# Patient Record
Sex: Male | Born: 1960
Health system: Southern US, Community
[De-identification: ages and names within clinical notes are randomized; demographics above are authoritative.]

## PROBLEM LIST (undated history)

## (undated) DIAGNOSIS — E785 Hyperlipidemia, unspecified: Secondary | ICD-10-CM

## (undated) DIAGNOSIS — Z87442 Personal history of urinary calculi: Secondary | ICD-10-CM

## (undated) DIAGNOSIS — Z8719 Personal history of other diseases of the digestive system: Secondary | ICD-10-CM

## (undated) DIAGNOSIS — M1990 Unspecified osteoarthritis, unspecified site: Secondary | ICD-10-CM

## (undated) DIAGNOSIS — S069X9A Unspecified intracranial injury with loss of consciousness of unspecified duration, initial encounter: Secondary | ICD-10-CM

## (undated) DIAGNOSIS — R911 Solitary pulmonary nodule: Secondary | ICD-10-CM

## (undated) DIAGNOSIS — I2510 Atherosclerotic heart disease of native coronary artery without angina pectoris: Secondary | ICD-10-CM

## (undated) DIAGNOSIS — D229 Melanocytic nevi, unspecified: Secondary | ICD-10-CM

## (undated) DIAGNOSIS — N400 Enlarged prostate without lower urinary tract symptoms: Secondary | ICD-10-CM

## (undated) DIAGNOSIS — Z91018 Allergy to other foods: Secondary | ICD-10-CM

## (undated) DIAGNOSIS — E039 Hypothyroidism, unspecified: Secondary | ICD-10-CM

## (undated) DIAGNOSIS — F419 Anxiety disorder, unspecified: Secondary | ICD-10-CM

## (undated) DIAGNOSIS — I10 Essential (primary) hypertension: Secondary | ICD-10-CM

## (undated) DIAGNOSIS — T884XXA Failed or difficult intubation, initial encounter: Secondary | ICD-10-CM

## (undated) DIAGNOSIS — K219 Gastro-esophageal reflux disease without esophagitis: Secondary | ICD-10-CM

## (undated) DIAGNOSIS — Z9689 Presence of other specified functional implants: Secondary | ICD-10-CM

## (undated) DIAGNOSIS — T7840XA Allergy, unspecified, initial encounter: Secondary | ICD-10-CM

## (undated) DIAGNOSIS — I499 Cardiac arrhythmia, unspecified: Secondary | ICD-10-CM

## (undated) DIAGNOSIS — J189 Pneumonia, unspecified organism: Secondary | ICD-10-CM

## (undated) DIAGNOSIS — G709 Myoneural disorder, unspecified: Secondary | ICD-10-CM

## (undated) HISTORY — DX: Hyperlipidemia, unspecified: E78.5

## (undated) HISTORY — PX: COLONOSCOPY: SHX174

## (undated) HISTORY — DX: Allergy, unspecified, initial encounter: T78.40XA

## (undated) HISTORY — DX: Solitary pulmonary nodule: R91.1

## (undated) HISTORY — PX: HERNIA REPAIR: SHX51

## (undated) HISTORY — DX: Unspecified osteoarthritis, unspecified site: M19.90

## (undated) HISTORY — DX: Hypothyroidism, unspecified: E03.9

## (undated) HISTORY — PX: HEMORRHOID BANDING: SHX5850

## (undated) HISTORY — DX: Gastro-esophageal reflux disease without esophagitis: K21.9

## (undated) HISTORY — DX: Essential (primary) hypertension: I10

## (undated) HISTORY — PX: OTHER SURGICAL HISTORY: SHX169

## (undated) HISTORY — PX: SPINAL FUSION: SHX223

## (undated) HISTORY — PX: VASECTOMY: SHX75

## (undated) HISTORY — DX: Benign prostatic hyperplasia without lower urinary tract symptoms: N40.0

## (undated) HISTORY — PX: BACK SURGERY: SHX140

## (undated) HISTORY — PX: CORONARY ANGIOPLASTY: SHX604

## (undated) HISTORY — DX: Melanocytic nevi, unspecified: D22.9

---

## 1980-08-06 HISTORY — PX: TONSILLECTOMY: SUR1361

## 2004-04-18 ENCOUNTER — Emergency Department (HOSPITAL_COMMUNITY): Admission: EM | Admit: 2004-04-18 | Discharge: 2004-04-18 | Payer: Self-pay | Admitting: Emergency Medicine

## 2008-03-22 ENCOUNTER — Ambulatory Visit: Payer: Self-pay | Admitting: Cardiology

## 2008-03-22 ENCOUNTER — Encounter: Payer: Self-pay | Admitting: Pulmonary Disease

## 2008-03-22 ENCOUNTER — Observation Stay (HOSPITAL_COMMUNITY): Admission: EM | Admit: 2008-03-22 | Discharge: 2008-03-23 | Payer: Self-pay | Admitting: Emergency Medicine

## 2008-03-26 ENCOUNTER — Ambulatory Visit: Payer: Self-pay | Admitting: Cardiology

## 2008-03-26 ENCOUNTER — Ambulatory Visit: Payer: Self-pay

## 2008-04-21 ENCOUNTER — Ambulatory Visit: Payer: Self-pay | Admitting: Cardiology

## 2008-05-20 ENCOUNTER — Encounter: Payer: Self-pay | Admitting: Pulmonary Disease

## 2008-05-28 ENCOUNTER — Encounter: Payer: Self-pay | Admitting: Pulmonary Disease

## 2008-06-11 ENCOUNTER — Ambulatory Visit: Payer: Self-pay | Admitting: Pulmonary Disease

## 2008-06-11 DIAGNOSIS — I10 Essential (primary) hypertension: Secondary | ICD-10-CM | POA: Insufficient documentation

## 2008-06-11 DIAGNOSIS — J984 Other disorders of lung: Secondary | ICD-10-CM | POA: Insufficient documentation

## 2008-09-21 ENCOUNTER — Encounter: Payer: Self-pay | Admitting: Pulmonary Disease

## 2008-10-01 ENCOUNTER — Ambulatory Visit: Payer: Self-pay | Admitting: Pulmonary Disease

## 2009-08-06 DIAGNOSIS — G709 Myoneural disorder, unspecified: Secondary | ICD-10-CM

## 2009-08-06 HISTORY — DX: Myoneural disorder, unspecified: G70.9

## 2010-06-05 ENCOUNTER — Ambulatory Visit (HOSPITAL_BASED_OUTPATIENT_CLINIC_OR_DEPARTMENT_OTHER): Admission: RE | Admit: 2010-06-05 | Discharge: 2010-06-05 | Payer: Self-pay | Admitting: Orthopedic Surgery

## 2010-09-25 NOTE — Op Note (Signed)
Jeffrey Hooper, [place]               ACCOUNT NO.:  [id number]  MEDICAL RECORD NO.:  [id number]          PATIENT TYPE:  AMB  LOCATION:  DSC                          FACILITY:  [facility]  PHYSICIAN:  Cindee Salt, M.D.       DATE OF BIRTH:  [birth year]  DATE OF PROCEDURE:  06/05/2010 DATE OF DISCHARGE:                              OPERATIVE REPORT   PREOPERATIVE DIAGNOSIS:  Laceration, radial digital nerve, left hand.  POSTOPERATIVE DIAGNOSIS:  Laceration, radial digital nerve, left hand.  OPERATION:  Excision neuroma, repair of radial nerve with NeuraGen tube, left hand.  SURGEON:  Cindee Salt, MD  ASSISTANT:  Carolyne Fiscal.  ANESTHESIA:  Axillary block.  ANESTHESIOLOGIST:  Quita Skye. Krista Blue, MD  HISTORY:  The patient is a 50 year old male with history of a laceration of his left thenar eminence with a foreign body.  He has complained of numbness in the area distal with a positive Tinel's at his laceration. He is admitted for exploration of radial nerve with possible repair. Pre, peri, and postoperative course have been discussed along with risks and complications.  He is aware that there is no guarantee with the surgery; possibility of infection; recurrence injury to arteries, nerves, tendons, complete relief of symptoms, dystrophy; possibility of necessity of burying the nerve should this not regenerate properly.  In the preoperative area, the patient is seen, the extremity marked by both the patient and surgeon, and antibiotic given.  PROCEDURE:  The patient was brought to the operating room.  An axillary block carried out without difficulty.  He was prepped using ChloraPrep, supine position, left arm free.  A 3-minute dry time was allowed.  Time- out taken confirming the patient and procedure.  The limb was exsanguinated with an Esmarch.  Tourniquet was placed high and the forearm was inflated to 250 mmHg.  The area of the foreign body laceration was opened.  The radial nerve sensory  branch was identified proximally and distally.  This was followed back to the area of the laceration where a large neuroma was present.  The operative microscope was brought into position.  This was unable to be further dissected free.  This was resected.  A 1.5-mm NeuraGen tube was then soaked for 10 minutes.  This was cut in half.  The two ends of the nerve were then sutured into the NeuraGen after irrigation with saline.  This was done with a horizontal mattress suture, which brought the nerve endings within a millimeter or two of each other.  The wound was again irrigated.  The skin then closed with interrupted 5-0 Vicryl Rapide sutures.  The procedure was done cutting back to normal fascicles under the operative microscope.  A sterile compressive dressing and splint was applied.  On deflation of the tourniquet, the fingers and thumb immediately pinked.  The specimen was sent to pathology.  The patient tolerated the procedure well and was taken to the recovery room for observation in satisfactory condition.  He will be discharged to home to return in the [facility] [place] in 1 week, on Vicodin.  ______________________________ Cindee Salt, M.D.     GK/MEDQ  D:  06/05/2010  T:  06/06/2010  Job:  [id number]  cc:   Ernestina Penna, M.D.  Electronically Signed by Cindee Salt M.D. on 09/25/2010 12:18:22 PM

## 2010-10-18 LAB — POCT I-STAT, CHEM 8
Chloride: 106 mEq/L (ref 96–112)
Glucose, Bld: 90 mg/dL (ref 70–99)
HCT: 48 % (ref 39.0–52.0)

## 2010-12-19 NOTE — Assessment & Plan Note (Signed)
[facility]                            CARDIOLOGY OFFICE NOTE   Jeffrey Hooper, Jeffrey Hooper                      MRN:          [id number]  DATE:04/21/2008                            DOB:          April 30, 1961    PRIMARY CARE PHYSICIAN:  Lorin Picket Long, PA   REASON FOR PRESENTATION:  Evaluate the patient with recent  hospitalization for chest pain.   HISTORY OF PRESENT ILLNESS:  The patient was admitted on March 22, 2008  for chest pain.  He also had a headache.  He ruled out for myocardial  infarction.  He was noted to be bradycardic during that hospitalization  with heart rates in the 50s.  Subsequently, he had a stress perfusion  study in our office that demonstrated a well-preserved ejection fraction  of 68%.  There was no ischemia or infarct.  He wore a Holter monitor and  was found to have heart rate, which averaged in the 50s.  However, he  had normal chronotropic competence.  He did have heart rates in the 30s  during his sleeping hours.  He has had no symptoms related to this.   Since that time, he has had no further chest discomfort other than some  fleeting, stinging discomfort.  He has been walking for exercise.  He  gets no chest pressure with this.  He has appropriate shortness of  breath with this.  He does not have any PND or orthopnea.  He has not  felt his heart racing or skipping.  He has had no presyncope or syncope.   PAST MEDICAL HISTORY:  Hypothyroidism, hypertension, previous tobacco  use, cervical and thoracic surgery.   ALLERGIES/INTOLERANCES:  VIBRAMYCIN AND DURAGESIC PATCH.   MEDICATIONS:  1. Aspirin 81 mg daily.  2. Hydrochlorothiazide 25 mg daily.  3. Synthroid 112 mcg daily.  4. Aciphex.   REVIEW OF SYSTEMS:  As stated in the HPI and otherwise negative for  other systems.   PHYSICAL EXAMINATION:  GENERAL:  The patient is in no distress.  VITAL SIGNS:  Blood pressure 124/92, heart rate 51 and regular, weight  209 pounds,  body mass index 29.  HEENT:  Eyelids unremarkable; pupils equal, round, and reactive to  light; fundi not visualized; oral mucosa unremarkable.  NECK:  No jugular venous distention at 45 degrees; carotid upstroke  brisk and symmetric; no bruits, no thyromegaly.  LYMPHATICS:  No adenopathy.  LUNGS:  Clear to auscultation bilaterally.  BACK:  No costovertebral angle tenderness.  CHEST:  Unremarkable.  HEART:  PMI not displaced on sustained; S1 and S2 within normal limits;  no S3, no S4; no clicks, no rubs, no murmurs.  ABDOMEN:  Flat; positive bowel sounds, normal in frequency and pitch; no  bruits, no rebound, no guarding; no midline pulsatile mass, no  hepatomegaly, no splenomegaly.  SKIN:  No rashes, no nodules.  EXTREMITIES:  2+ pulses, no edema.   ASSESSMENT AND PLAN:  1. Chest pain.  The patient has had no further chest pain.  He had      negative stress perfusion study.  No further cardiovascular testing  is suggested.  He will continue with primary risk reduction.  2. Bradycardia.  He is not having any symptoms with this.  He has      normal chronotropic competence.  No further evaluation is      warranted.  He will let me know if he ever has any lightheadedness,      presyncope, syncope, or palpitations.  3. Dyslipidemia.  I did review this with him.  In the hospital, his      LDL was 141 and his HDL was 31.  I have suggested diet and exercise      for about 3 months and then a repeat.  If he has not had much      improvement, I would suggest a statin.  He will follow up with Mr.      Jacqulyn Bath.  4. Followup.  He can come back to this clinic as needed.     Rollene Rotunda, MD, [facility]  Electronically Signed    JH/MedQ  DD: 04/21/2008  DT: 04/22/2008  Job #: [id number]   cc:   Lindaann Pascal, PA

## 2010-12-19 NOTE — Discharge Summary (Signed)
[name], [name] NO.:  [id number]   MEDICAL RECORD NO.:  [id number]          PATIENT TYPE:  OBV   LOCATION:  3733                         FACILITY:  [facility]   PHYSICIAN:  Marca Ancona, MD      DATE OF BIRTH:  [birth year]   DATE OF ADMISSION:  03/22/2008  DATE OF DISCHARGE:  03/23/2008                               DISCHARGE SUMMARY   PROCEDURES:  None.   PRIMARY FINAL DISCHARGE DIAGNOSIS:  Chest pain, cardiac enzymes negative  for myocardial infarction and outpatient Myoview planned.   SECONDARY DIAGNOSES:  1. Bradycardia, Holter monitor planned as an outpatient.  2. Hypertension.  3. Hypothyroidism.  4. Allergy or intolerance to VIBRAMYCIN and DURAGESIC PATCHES.  5. Status post cervical and thoracic surgery.  6. A 30-pack year history of tobacco use, quit 1 year.  7. Family history of coronary artery disease in his father.  8. Headache, resolved.   TIME AT DISCHARGE:  41 minutes.   HOSPITAL COURSE:  Jeffrey Hooper is a 50 year old male with no previous  history of coronary artery disease.  He had chest pain that was somewhat  atypical.  He was admitted for further evaluation and treatment.   Cardiac enzymes were negative for MI.  His chest pain and headache that  he had also had for several days had resolved.  His chest x-ray showed  no acute disease.  He had no significant abnormalities in his labs,  although a lipid profile was pending at the time of dictation.  TSH is  also pending.   On March 23, 2008, Jeffrey Hooper was seen by Dr. Shirlee Latch.  Dr. Shirlee Latch felt  that Jeffrey Hooper could be safely discharged home with close outpatient  followup and stress testing.  Because of the sinus bradycardia with the  heart rate in the low 50s at times, a Holter monitor is also planned.   DISCHARGE INSTRUCTIONS:  1. His activity level is to be increased gradually.  2. He is to eat a heart-healthy low-sodium diet.  3. He is to get a stress test at our office and pick  up his Holter      monitor this Friday at 9:45.  4. He is not to eat or drink anything after midnight before and he is      to take his medicines in a.m. with a sip of water.  5. No caffeine or decaf products 24 hours before.  6. He is to follow up with Dr. Antoine Poche in [place] and with Dr. Christell Constant      as needed.   DISCHARGE MEDICATIONS:  1. Aspirin 81 mg daily.  2. Hydrochlorothiazide 25 mg a day.  3. Synthroid 125 mcg daily.  4. Aciphex daily.      Theodore Demark, PA-C      Marca Ancona, MD  Electronically Signed    RB/MEDQ  D:  03/23/2008  T:  03/24/2008  Job:  [id number]   cc:   Ernestina Penna, M.D.

## 2010-12-19 NOTE — Consult Note (Signed)
[name], RAMUS NO.:  [id number]   MEDICAL RECORD NO.:  [id number]          PATIENT TYPE:  OBV   LOCATION:  3733                         FACILITY:  [facility]   PHYSICIAN:  Rollene Rotunda, MD, FACCDATE OF BIRTH:  Nov 17, 1960   DATE OF CONSULTATION:  03/22/2008  DATE OF DISCHARGE:                                 CONSULTATION   PRIMARY CARE PHYSICIAN:  Ernestina Penna, MD   CARDIOLOGIST:  New.   REASON FOR PRESENTATION:  Evaluate the patient with chest pain.   HISTORY OF PRESENT ILLNESS:  The patient is a pleasant 50 year old white  gentleman whose cardiac history includes bradycardia.  He needed no  treatment in the past.  He did have a stress test he reports in 2005.  This was at [facility].  He is not sure whether this was a nuclear test.  He thinks it was normal.  There was no followup needed.   The patient has been doing well.  However, on Thursday while at work,  not particularly exerting himself, he had an episode of chest  discomfort.  This was substernal.  Somewhat sharp.  He did not think it  radiated to his neck or to his arms.  It was moderately intense.  It  lasted for 4-5 minutes.  There was some nausea and diaphoresis.  He  never had pain like this before.  It was unlike his previous reflex.  He  did feel lightheaded with this.  He had to go to his car.  He said the  whole episode lasted about 45 minutes.  He had no presyncope with this.  Following this over the last 4 days, he has had fleeting chest  discomfort.  It has been sharp.  Substernal.  It has been mild.  He has  not been able to bring this on.  It comes and goes in seconds.  There  has been no radiation or associated symptoms.  He was not particularly  active with his usual activities without limitations.  He has not had  any decreased exercise tolerance.  He has no resting PND or orthopnea.  Because the symptoms were little bit more frequent today, he presented  to his primary care  doctor who sent him to the ER.  He had an EKG with  no acute ST-segment changes.  He is currently pain free.   Of note, the patient has had a headache since Thursday.  This has been  persistent.  There are no visual changes, motor changes, or speech  changes.  His posterior headache radiated on the top of his head.   PAST MEDICAL HISTORY:  1. Hypertension x1 year.  2. Hypothyroidism.  3. Bradycardia, asymptomatic.   PAST SURGICAL HISTORY:  Cervical and thoracic surgery.   ALLERGIES/INTOLERANCES:  VIBRAMYCIN, DURAGESIC PATCH.   MEDICATIONS:  Aciphex, hydrochlorothiazide 25 mg daily, levothyroxine  0.125 mg daily, and Xanax.   SOCIAL HISTORY:  The patient is married.  He has 2 adult children.  He  smoked for almost 30 years one-pack per day, but quit last year cold  [place].   FAMILY  HISTORY:  Noncontributory for early coronary disease though his  father had a heart attack at age 1.   REVIEW OF SYSTEMS:  As stated in HPI and positive for insomnia.   PHYSICAL EXAMINATION:  HEENT:  Eyes are unremarkable, pupils equal round  and reactive to light, fundi not visualized, oral mucosa unremarkable.  NECK:  No jugular venous distention at 45 degrees, carotid upstroke  brisk and symmetrical, no bruits, no thyromegaly.  LYMPHATICS:  No cervical, axillary, or inguinal adenopathy.  LUNGS:  Clear to auscultation bilaterally.  BACK:  No costovertebral angle tenderness.  CHEST:  Unremarkable.  HEART:  PMI not displaced or sustained, S1 and S2 within normal limits,  no S3, no S4, no clicks, no rubs, no murmurs.  ABDOMEN:  Flat, positive bowel sounds, normal in frequency and pitch, no  bruits, no rebound, no guarding, no midline pulsatile mass, no  hepatomegaly, no splenomegaly.  SKIN:  No rashes, no nodules.  EXTREMITIES:  Pulse 2+ throughout, no edema, no cyanosis, no clubbing.  NEURO:  Oriented to person, place, and time, cranial nerves II-XII  grossly intact, motor grossly intact.    EKG, sinus bradycardia, rate 56, axes within normal limits, intervals  within normal limits, no acute ST-T wave change.   LABORATORY DATA:  WBC 8.5, hemoglobin 15.5, platelets 177.   Other labs pending.   Chest x-ray, no acute pulmonary findings.   ASSESSMENT/PLAN:  1. Chest discomfort.  The patient's chest discomfort has some features      consistent with unstable angina.  He has significant cardiovascular      risk factors.  At this point, the pretest probability of      obstructive coronary disease is moderately high.  Given this, we      will put him in the hospital to rule out myocardial infarction.  I      will check an EKG in the morning.  If his enzymes and EKG are      unchanged and he has no further symptoms, I would suggest stress      perfusion imaging.  This is indicated as the higher sensitivity and      specificity is necessary versus a plain exercise treadmill.  2. Hypertension.  We will continue his hydrochlorothiazide.  3. Hypothyroidism.  We will check TSH.  Risk reduction and check lipid      profile.  4. Headache.  The patient has been complaining of a headache.  There      are no focal findings.  I will treat him with some Tylenol No. 3.      If this persists, he will need further evaluation probably as an      outpatient.  Again, he has a negative neuro finding.      Rollene Rotunda, MD, [facility]  Electronically Signed    JH/MEDQ  D:  03/22/2008  T:  03/23/2008  Job:  [id number]   cc:   Ernestina Penna, M.D.

## 2011-11-28 ENCOUNTER — Ambulatory Visit (INDEPENDENT_AMBULATORY_CARE_PROVIDER_SITE_OTHER): Payer: BC Managed Care – PPO | Admitting: Cardiology

## 2011-11-28 ENCOUNTER — Encounter: Payer: Self-pay | Admitting: Cardiology

## 2011-11-28 DIAGNOSIS — I10 Essential (primary) hypertension: Secondary | ICD-10-CM

## 2011-11-28 DIAGNOSIS — R42 Dizziness and giddiness: Secondary | ICD-10-CM

## 2011-11-28 DIAGNOSIS — R079 Chest pain, unspecified: Secondary | ICD-10-CM | POA: Insufficient documentation

## 2011-11-28 NOTE — Assessment & Plan Note (Signed)
It is unlikely that this was related to any arrhythmia or other cardiac etiology. This could have been an acute vertiginous episode. If it recurs ENT evaluation could be considered.

## 2011-11-28 NOTE — Progress Notes (Signed)
HPI The patient presents for evaluation of chest discomfort. I saw him for over 3 years ago. He presented for chest discomfort at the hospital ruled out for myocardial infarction and had a negative stress perfusion study. Since that time he had been doing well until a few months ago. He woke one evening suddenly with dizziness area and he got and he felt like he versus thinning. He was diaphoretic. He sat down in a chair and says that symptoms slowly resolved after 30-40 minutes. He didn't describe chest neck or arm discomfort. He didn't describe specifically palpitations. Since that time he's had some chest discomfort. He will happens suddenly and sporadically. He describes a midsternal feeling like somebody punched him. He's actually been having this less frequently. He cannot bring this on. He does do active work without bringing on these symptoms. He's had no new shortness of breath, PND or orthopnea. He has some mild occasional orthostatic symptoms but no syncope or presyncope. He did have an exercise treadmill test and I reviewed these entire tracing. There was no ischemia or infarct suggested. He had an event monitor for dysrhythmias.  Allergies  Allergen Reactions  . Doxycycline Hyclate     REACTION: whelps, itching  . Penicillins     Current Outpatient Prescriptions  Medication Sig Dispense Refill  . aspirin 81 MG tablet Take 81 mg by mouth daily.      [name] ezetimibe-simvastatin (VYTORIN) 10-40 MG per tablet Take 1 tablet by mouth at bedtime.      . hydrochlorothiazide (HYDRODIURIL) 25 MG tablet Take 25 mg by mouth daily.      [name] levothyroxine (SYNTHROID, LEVOTHROID) 100 MCG tablet Take 100 mcg by mouth daily.      . NON FORMULARY 25 mg daily. Allergy reflief      . omeprazole (PRILOSEC) 40 MG capsule Take 40 mg by mouth daily.        Past Medical History  Diagnosis Date  . Hypertension   . Pulmonary nodule     Past Surgical History  Procedure Date  . Back surgery   . Spinal  fusion     c6-7  . Tonsillectomy   . Vasectomy   . Sinus signs     Family History  Problem Relation Age of Onset  . Heart attack Father     History   Social History  . Marital Status: Married    Spouse Name: N/A    Number of Children: N/A  . Years of Education: N/A   Occupational History  . Not on file.   Social History Main Topics  . Smoking status: Former Smoker    Quit date: 11/28/2006  . Smokeless tobacco: Not on file  . Alcohol Use: Not on file  . Drug Use: Not on file  . Sexually Active: Not on file   Other Topics Concern  . Not on file   Social History Narrative  . No narrative on file    ROS:  Positive for dizziness, reflux, leg cramping back pain and seasonal allergies. Otherwise as stated in the HPI and negative for all other systems.  PHYSICAL EXAM BP 140/80  Pulse 57  Ht 5\' 10"  (1.778 m)  Wt 211 lb (95.709 kg)  BMI 30.28 kg/m2 GENERAL:  Well appearing HEENT:  Pupils equal round and reactive, fundi not visualized, oral mucosa unremarkable NECK:  No jugular venous distention, waveform within normal limits, carotid upstroke brisk and symmetric, no bruits, no thyromegaly LYMPHATICS:  No cervical, inguinal adenopathy LUNGS:  Clear to auscultation bilaterally BACK:  No CVA tenderness CHEST:  Unremarkable HEART:  PMI not displaced or sustained,S1 and S2 within normal limits, no S3, no S4, no clicks, no rubs, no murmurs ABD:  Flat, positive bowel sounds normal in frequency in pitch, no bruits, no rebound, no guarding, no midline pulsatile mass, no hepatomegaly, no splenomegaly EXT:  2 plus pulses throughout, no edema, no cyanosis no clubbing SKIN:  No rashes no nodules NEURO:  Cranial nerves II through XII grossly intact, motor grossly intact throughout PSYCH:  Cognitively intact, oriented to person place and time  EKG:  Sinus rhythm, rate 57, axis within normal limits, intervals within normal limits, no acute ST-T wave changes. 11/28/2011  ASSESSMENT  AND PLAN

## 2011-11-28 NOTE — Assessment & Plan Note (Signed)
I see no high-risk findings. He had a negative exercise treadmill test. His symptoms are actually approving. At this point I think the post test probability of obstructive coronary disease is low and I do not think further stress testing is indicated. I would certainly reconsider based on any future symptoms.

## 2011-11-28 NOTE — Assessment & Plan Note (Signed)
The blood pressure is at target. No change in medications is indicated. We will continue with therapeutic lifestyle changes (TLC).  

## 2011-11-28 NOTE — Patient Instructions (Signed)
The current medical regimen is effective;  continue present plan and medications.  Follow up as needed 

## 2012-05-01 LAB — BASIC METABOLIC PANEL: Glucose: 98 mg/dL

## 2012-05-01 LAB — LIPID PANEL: LDL Cholesterol: 84 mg/dL

## 2012-05-01 LAB — CBC AND DIFFERENTIAL: WBC: 8.3 10^3/mL

## 2012-05-01 LAB — HEPATIC FUNCTION PANEL
ALT: 37 U/L (ref 10–40)
AST: 25 U/L (ref 14–40)
Alkaline Phosphatase: 67 U/L (ref 25–125)
Bilirubin, Total: 1 mg/dL

## 2012-05-05 ENCOUNTER — Other Ambulatory Visit: Payer: Self-pay | Admitting: Family Medicine

## 2012-05-05 DIAGNOSIS — R42 Dizziness and giddiness: Secondary | ICD-10-CM

## 2012-05-07 ENCOUNTER — Ambulatory Visit
Admission: RE | Admit: 2012-05-07 | Discharge: 2012-05-07 | Disposition: A | Discharge status: Home / Self Care | Payer: BC Managed Care – PPO | Source: Ambulatory Visit | Attending: Family Medicine | Admitting: Family Medicine

## 2012-05-07 DIAGNOSIS — R42 Dizziness and giddiness: Secondary | ICD-10-CM

## 2012-06-13 ENCOUNTER — Other Ambulatory Visit: Payer: Self-pay | Admitting: Orthopedic Surgery

## 2012-06-20 ENCOUNTER — Encounter (HOSPITAL_BASED_OUTPATIENT_CLINIC_OR_DEPARTMENT_OTHER): Payer: Self-pay | Admitting: *Deleted

## 2012-06-20 NOTE — Progress Notes (Signed)
Was here for this hand 2011-cannot come in for labs Saw dr hochrein 4/13 for chest pain-stress and ekg done-non cardiac- Will need istat

## 2012-06-25 ENCOUNTER — Encounter (HOSPITAL_BASED_OUTPATIENT_CLINIC_OR_DEPARTMENT_OTHER): Payer: Self-pay | Admitting: Orthopedic Surgery

## 2012-06-25 ENCOUNTER — Ambulatory Visit (HOSPITAL_BASED_OUTPATIENT_CLINIC_OR_DEPARTMENT_OTHER)
Admission: RE | Admit: 2012-06-25 | Discharge: 2012-06-25 | Disposition: A | Discharge status: Home / Self Care | Payer: Worker's Compensation | Source: Ambulatory Visit | Attending: Orthopedic Surgery | Admitting: Orthopedic Surgery

## 2012-06-25 ENCOUNTER — Encounter (HOSPITAL_BASED_OUTPATIENT_CLINIC_OR_DEPARTMENT_OTHER): Payer: Self-pay | Admitting: *Deleted

## 2012-06-25 ENCOUNTER — Encounter (HOSPITAL_BASED_OUTPATIENT_CLINIC_OR_DEPARTMENT_OTHER)
Admission: RE | Disposition: A | Discharge status: Home / Self Care | Payer: Self-pay | Source: Ambulatory Visit | Attending: Orthopedic Surgery

## 2012-06-25 ENCOUNTER — Encounter (HOSPITAL_BASED_OUTPATIENT_CLINIC_OR_DEPARTMENT_OTHER): Payer: Self-pay | Admitting: Anesthesiology

## 2012-06-25 ENCOUNTER — Ambulatory Visit (HOSPITAL_BASED_OUTPATIENT_CLINIC_OR_DEPARTMENT_OTHER): Payer: Worker's Compensation | Admitting: Anesthesiology

## 2012-06-25 DIAGNOSIS — E039 Hypothyroidism, unspecified: Secondary | ICD-10-CM | POA: Insufficient documentation

## 2012-06-25 DIAGNOSIS — Z6830 Body mass index (BMI) 30.0-30.9, adult: Secondary | ICD-10-CM | POA: Insufficient documentation

## 2012-06-25 DIAGNOSIS — Z87891 Personal history of nicotine dependence: Secondary | ICD-10-CM | POA: Insufficient documentation

## 2012-06-25 DIAGNOSIS — Z880 Allergy status to penicillin: Secondary | ICD-10-CM | POA: Insufficient documentation

## 2012-06-25 DIAGNOSIS — Z8249 Family history of ischemic heart disease and other diseases of the circulatory system: Secondary | ICD-10-CM | POA: Insufficient documentation

## 2012-06-25 DIAGNOSIS — K449 Diaphragmatic hernia without obstruction or gangrene: Secondary | ICD-10-CM | POA: Insufficient documentation

## 2012-06-25 DIAGNOSIS — K219 Gastro-esophageal reflux disease without esophagitis: Secondary | ICD-10-CM | POA: Insufficient documentation

## 2012-06-25 DIAGNOSIS — M5412 Radiculopathy, cervical region: Secondary | ICD-10-CM | POA: Insufficient documentation

## 2012-06-25 DIAGNOSIS — I10 Essential (primary) hypertension: Secondary | ICD-10-CM | POA: Insufficient documentation

## 2012-06-25 DIAGNOSIS — N400 Enlarged prostate without lower urinary tract symptoms: Secondary | ICD-10-CM | POA: Insufficient documentation

## 2012-06-25 DIAGNOSIS — Z7982 Long term (current) use of aspirin: Secondary | ICD-10-CM | POA: Insufficient documentation

## 2012-06-25 HISTORY — PX: MASS EXCISION: SHX2000

## 2012-06-25 LAB — POCT I-STAT, CHEM 8
Calcium, Ion: 1.12 mmol/L (ref 1.12–1.23)
Chloride: 103 mEq/L (ref 96–112)
HCT: 40 % (ref 39.0–52.0)
Potassium: 3.3 mEq/L — ABNORMAL LOW (ref 3.5–5.1)

## 2012-06-25 SURGERY — EXCISION MASS
Anesthesia: Regional | Site: Wrist | Laterality: Left

## 2012-06-25 MED ORDER — OXYCODONE-ACETAMINOPHEN 7.5-325 MG PO TABS: 1.0000 | ORAL_TABLET | ORAL | Status: DC | PRN

## 2012-06-25 MED ORDER — FENTANYL CITRATE 0.05 MG/ML IJ SOLN
25.0000 ug | INTRAMUSCULAR | Status: DC | PRN
  Administered 2012-06-25: 50 ug via INTRAVENOUS
  Administered 2012-06-25: 25 ug via INTRAVENOUS

## 2012-06-25 MED ORDER — MEPERIDINE HCL 25 MG/ML IJ SOLN: 6.2500 mg | INTRAMUSCULAR | Status: DC | PRN

## 2012-06-25 MED ORDER — LACTATED RINGERS IV SOLN
INTRAVENOUS | Status: DC
  Administered 2012-06-25 (×2): via INTRAVENOUS

## 2012-06-25 MED ORDER — PROMETHAZINE HCL 25 MG/ML IJ SOLN: 6.2500 mg | INTRAMUSCULAR | Status: DC | PRN

## 2012-06-25 MED ORDER — MIDAZOLAM HCL 2 MG/2ML IJ SOLN: 0.5000 mg | Freq: Once | INTRAMUSCULAR | Status: DC | PRN

## 2012-06-25 MED ORDER — BUPIVACAINE HCL (PF) 0.25 % IJ SOLN
INTRAMUSCULAR | Status: DC | PRN
  Administered 2012-06-25: 5 mL

## 2012-06-25 MED ORDER — VANCOMYCIN HCL IN DEXTROSE 1-5 GM/200ML-% IV SOLN
1000.0000 mg | INTRAVENOUS | Status: AC
  Administered 2012-06-25: 1000 mg via INTRAVENOUS

## 2012-06-25 MED ORDER — LIDOCAINE HCL (PF) 0.5 % IJ SOLN: INTRAMUSCULAR | Status: DC | PRN

## 2012-06-25 MED ORDER — OXYCODONE HCL 5 MG/5ML PO SOLN
5.0000 mg | Freq: Once | ORAL | Status: DC | PRN
Start: 2012-06-25 — End: 2012-06-25

## 2012-06-25 MED ORDER — CHLORHEXIDINE GLUCONATE 4 % EX LIQD: 60.0000 mL | Freq: Once | CUTANEOUS | Status: DC

## 2012-06-25 MED ORDER — MIDAZOLAM HCL 5 MG/5ML IJ SOLN
INTRAMUSCULAR | Status: DC | PRN
  Administered 2012-06-25 (×2): 1 mg via INTRAVENOUS

## 2012-06-25 MED ORDER — PROPOFOL 10 MG/ML IV EMUL
INTRAVENOUS | Status: DC | PRN
  Administered 2012-06-25: 200 ug/kg/min via INTRAVENOUS

## 2012-06-25 MED ORDER — ONDANSETRON HCL 4 MG/2ML IJ SOLN
INTRAMUSCULAR | Status: DC | PRN
  Administered 2012-06-25: 4 mg via INTRAVENOUS

## 2012-06-25 MED ORDER — LIDOCAINE HCL (PF) 0.5 % IJ SOLN
INTRAMUSCULAR | Status: DC | PRN
  Administered 2012-06-25: 50 mL via INTRAVENOUS

## 2012-06-25 MED ORDER — LIDOCAINE HCL (CARDIAC) 20 MG/ML IV SOLN
INTRAVENOUS | Status: DC | PRN
  Administered 2012-06-25: 25 mg via INTRAVENOUS

## 2012-06-25 MED ORDER — DEXAMETHASONE SODIUM PHOSPHATE 10 MG/ML IJ SOLN
INTRAMUSCULAR | Status: DC | PRN
  Administered 2012-06-25: 10 mg via INTRAVENOUS

## 2012-06-25 MED ORDER — OXYCODONE HCL 5 MG PO TABS: 5.0000 mg | ORAL_TABLET | Freq: Once | ORAL | Status: DC | PRN

## 2012-06-25 MED ORDER — FENTANYL CITRATE 0.05 MG/ML IJ SOLN
INTRAMUSCULAR | Status: DC | PRN
  Administered 2012-06-25 (×2): 50 ug via INTRAVENOUS

## 2012-06-25 SURGICAL SUPPLY — 50 items
BANDAGE COBAN STERILE 2 (GAUZE/BANDAGES/DRESSINGS) IMPLANT
BANDAGE GAUZE ELAST BULKY 4 IN (GAUZE/BANDAGES/DRESSINGS) ×1 IMPLANT
BLADE MINI RND TIP GREEN BEAV (BLADE) ×1 IMPLANT
BLADE SURG 15 STRL LF DISP TIS (BLADE) ×1 IMPLANT
BLADE SURG 15 STRL SS (BLADE) ×2
BNDG CMPR 9X4 STRL LF SNTH (GAUZE/BANDAGES/DRESSINGS) ×1
BNDG COHESIVE 1X5 TAN STRL LF (GAUZE/BANDAGES/DRESSINGS) IMPLANT
BNDG COHESIVE 3X5 TAN STRL LF (GAUZE/BANDAGES/DRESSINGS) ×1 IMPLANT
BNDG ESMARK 4X9 LF (GAUZE/BANDAGES/DRESSINGS) ×1 IMPLANT
CHLORAPREP W/TINT 26ML (MISCELLANEOUS) ×2 IMPLANT
CLOTH BEACON ORANGE TIMEOUT ST (SAFETY) ×2 IMPLANT
CORDS BIPOLAR (ELECTRODE) ×2 IMPLANT
COVER MAYO STAND STRL (DRAPES) ×2 IMPLANT
COVER TABLE BACK 60X90 (DRAPES) ×2 IMPLANT
CUFF TOURNIQUET SINGLE 18IN (TOURNIQUET CUFF) IMPLANT
DECANTER SPIKE VIAL GLASS SM (MISCELLANEOUS) IMPLANT
DRAIN PENROSE 1/2X12 LTX STRL (WOUND CARE) IMPLANT
DRAPE EXTREMITY T 121X128X90 (DRAPE) ×2 IMPLANT
DRAPE SURG 17X23 STRL (DRAPES) ×2 IMPLANT
GAUZE XEROFORM 1X8 LF (GAUZE/BANDAGES/DRESSINGS) ×2 IMPLANT
GLOVE BIO SURGEON STRL SZ 6.5 (GLOVE) ×2 IMPLANT
GLOVE BIOGEL PI IND STRL 8.5 (GLOVE) ×1 IMPLANT
GLOVE BIOGEL PI INDICATOR 8.5 (GLOVE) ×1
GLOVE INDICATOR 7.0 STRL GRN (GLOVE) ×1 IMPLANT
GLOVE SURG ORTHO 8.0 STRL STRW (GLOVE) ×2 IMPLANT
GOWN BRE IMP PREV XXLGXLNG (GOWN DISPOSABLE) ×2 IMPLANT
GOWN PREVENTION PLUS XLARGE (GOWN DISPOSABLE) ×2 IMPLANT
NDL SAFETY ECLIPSE 18X1.5 (NEEDLE) ×1 IMPLANT
NEEDLE 27GAX1X1/2 (NEEDLE) ×1 IMPLANT
NEEDLE HYPO 18GX1.5 SHARP (NEEDLE) ×2
NS IRRIG 1000ML POUR BTL (IV SOLUTION) ×2 IMPLANT
PACK BASIN DAY SURGERY FS (CUSTOM PROCEDURE TRAY) ×2 IMPLANT
PAD CAST 3X4 CTTN HI CHSV (CAST SUPPLIES) IMPLANT
PADDING CAST ABS 3INX4YD NS (CAST SUPPLIES)
PADDING CAST ABS 4INX4YD NS (CAST SUPPLIES) ×1
PADDING CAST ABS COTTON 3X4 (CAST SUPPLIES) IMPLANT
PADDING CAST ABS COTTON 4X4 ST (CAST SUPPLIES) ×1 IMPLANT
PADDING CAST COTTON 3X4 STRL (CAST SUPPLIES) ×2
SPLINT PLASTER CAST XFAST 3X15 (CAST SUPPLIES) IMPLANT
SPLINT PLASTER XTRA FASTSET 3X (CAST SUPPLIES)
SPONGE GAUZE 4X4 12PLY (GAUZE/BANDAGES/DRESSINGS) ×2 IMPLANT
STOCKINETTE 4X48 STRL (DRAPES) ×2 IMPLANT
SUT VIC AB 4-0 P2 18 (SUTURE) IMPLANT
SUT VICRYL RAPID 5 0 P 3 (SUTURE) IMPLANT
SUT VICRYL RAPIDE 4/0 PS 2 (SUTURE) ×2 IMPLANT
SYR BULB 3OZ (MISCELLANEOUS) ×2 IMPLANT
SYR CONTROL 10ML LL (SYRINGE) ×1 IMPLANT
TOWEL OR 17X24 6PK STRL BLUE (TOWEL DISPOSABLE) ×4 IMPLANT
UNDERPAD 30X30 INCONTINENT (UNDERPADS AND DIAPERS) ×2 IMPLANT
WATER STERILE IRR 1000ML POUR (IV SOLUTION) ×2 IMPLANT

## 2012-06-25 NOTE — Anesthesia Postprocedure Evaluation (Signed)
  Anesthesia Post-op Note  Patient: Jeffrey Hooper  Procedure(s) Performed: Procedure(s) (LRB) with comments: EXCISION MASS (Left) - transection of NEUROMA, BURYING RADIAL NERVE IN BRACHIORADIALIS LEFT SIDE  Patient Location: PACU  Anesthesia Type:Bier block  Level of Consciousness: awake, alert , oriented and patient cooperative  Airway and Oxygen Therapy: Patient Spontanous Breathing  Post-op Pain: mild  Post-op Assessment: Post-op Vital signs reviewed, Patient's Cardiovascular Status Stable, Respiratory Function Stable, Patent Airway, No signs of Nausea or vomiting and Pain level controlled  Post-op Vital Signs: Reviewed and stable  Complications: No apparent anesthesia complications

## 2012-06-25 NOTE — Anesthesia Preprocedure Evaluation (Addendum)
Anesthesia Evaluation  Patient identified by MRN, date of birth, ID band Patient awake    Reviewed: Allergy & Precautions, H&P , NPO status , Patient's Chart, lab work & pertinent test results  History of Anesthesia Complications Negative for: history of anesthetic complications  Airway Mallampati: II TM Distance: >3 FB Neck ROM: Full  Mouth opening: Limited Mouth Opening  Dental  (+) Teeth Intact and Dental Advisory Given   Pulmonary former smoker,  breath sounds clear to auscultation  Pulmonary exam normal       Cardiovascular hypertension, Pt. on medications Rhythm:Regular Rate:Normal  Stress test 4-5 years ago: no ischemia   Neuro/Psych negative neurological ROS     GI/Hepatic Neg liver ROS, hiatal hernia, GERD-  Medicated and Controlled,  Endo/Other  Hypothyroidism (on replacement) Morbid obesity  Renal/GU negative Renal ROS     Musculoskeletal   Abdominal (+) + obese,   Peds  Hematology   Anesthesia Other Findings   Reproductive/Obstetrics                           Anesthesia Physical Anesthesia Plan  ASA: II  Anesthesia Plan: MAC and Bier Block   Post-op Pain Management:    Induction:   Airway Management Planned: Simple Face Mask and Natural Airway  Additional Equipment:   Intra-op Plan:   Post-operative Plan:   Informed Consent: I have reviewed the patients History and Physical, chart, labs and discussed the procedure including the risks, benefits and alternatives for the proposed anesthesia with the patient or authorized representative who has indicated his/her understanding and acceptance.   Dental advisory given  Plan Discussed with: CRNA and Surgeon  Anesthesia Plan Comments: (Plan routine monitors, IV regional lidocaine )        Anesthesia Quick Evaluation

## 2012-06-25 NOTE — Op Note (Signed)
Dictation Number [id number]

## 2012-06-25 NOTE — Transfer of Care (Signed)
Immediate Anesthesia Transfer of Care Note  Patient: Jeffrey Hooper  Procedure(s) Performed: Procedure(s) (LRB) with comments: EXCISION MASS (Left) - transection of NEUROMA, BURYING RADIAL NERVE IN BRACHIORADIALIS LEFT SIDE  Patient Location: PACU  Anesthesia Type:Bier block  Level of Consciousness: awake, alert  and oriented  Airway & Oxygen Therapy: Patient Spontanous Breathing and Patient connected to face mask oxygen  Post-op Assessment: Report given to PACU RN, Post -op Vital signs reviewed and stable and Patient moving all extremities  Post vital signs: Reviewed and stable  Complications: No apparent anesthesia complications

## 2012-06-25 NOTE — Brief Op Note (Signed)
06/25/2012  1:47 PM  PATIENT:  Jeffrey Hooper  51 y.o. male  PRE-OPERATIVE DIAGNOSIS:  RADIAL NERVE LACERATION LEFT WRIST  POST-OPERATIVE DIAGNOSIS:  RADIAL NERVE LACERATION LEFWRIST  PROCEDURE:  Procedure(s) (LRB) with comments: EXCISION MASS (Left) - transection of NEUROMA, BURYING RADIAL NERVE IN BRACHIORADIALIS LEFT SIDE  SURGEON:  Surgeon(s) and Role:    * Nicki Reaper, MD - Primary  PHYSICIAN ASSISTANT:   ASSISTANTS: none   ANESTHESIA:   local and regional  EBL:  Total I/O In: 1200 [I.V.:1200] Out: -   BLOOD ADMINISTERED:none  DRAINS: none   LOCAL MEDICATIONS USED:  MARCAINE     SPECIMEN:  No Specimen  DISPOSITION OF SPECIMEN:  N/A  COUNTS:  YES  TOURNIQUET:   Total Tourniquet Time Documented: Upper Arm (Left) - 40 minutes  DICTATION: .Other Dictation: Dictation Number [id number]  PLAN OF CARE: Discharge to home after PACU  PATIENT DISPOSITION:  PACU - hemodynamically stable.

## 2012-06-25 NOTE — Anesthesia Procedure Notes (Addendum)
Procedure Name: MAC Date/Time: 06/25/2012 12:53 PM Performed by: Meyer Russel Pre-anesthesia Checklist: Patient identified, Emergency Drugs available, Suction available and Patient being monitored Patient Re-evaluated:Patient Re-evaluated prior to inductionOxygen Delivery Method: Simple face mask Preoxygenation: Pre-oxygenation with 100% oxygen

## 2012-06-25 NOTE — H&P (Signed)
Jeffrey Hooper is a 51 year old right hand dominant male who suffered an injury to his left thumb when a piece of metal struck him in the dorsal radial aspect of the metacarpal of his thumb. The injury occurred on 04-12-10. His thumb was numb initially. He was treated by Dr. Christell Constant in [place]. He has had continued numbness, tingling and pain. He was seen and treated with exploration and repair, excision of neuroma and repair of the dorsal sensory nerve and neurogen tube on 06-05-10. He has had continued pain, numbness and tingling, difficulty using this. He has undergone a block to his radial nerve. He states this improved his discomfort and allowed him to return to more regular function. This was his radial nerve at his wrist.   We have discussed with him the possibility of a radial nerve resection and he has decided he would like to undergo this. It is not greater than one year following his surgery.   PAST MEDICAL HISTORY: He is allergic to Vibramycin and steroid injections. He is on Levoxyl, HCTZ, and Vytorin. Past surgery includes fusion of his back, tonsillectomy, vasectomy, and the neurogen tube placement left thumb.  FAMILY H ISTORY: Positive for heart disease, high BP and arthritis.  SOCIAL HISTORY: He does not smoke. He drinks socially. 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  REVIEW OF SYSTEMS: Positive for glasses, ringing in his ears, otherwise negative for 14 points.  Jeffrey Hooper is an 51 y.o. male.   Chief Complaint: Radial neuropathy left arm  HPI: see above  Past Medical History  Diagnosis Date  . Hypertension   . Pulmonary nodule   . Hypothyroid   . GERD (gastroesophageal reflux disease)   . Hiatal hernia   . Prostatism   . Seasonal allergies     Past Surgical History  Procedure Date  . Back surgery     T12 - L1  . Spinal fusion     C6-7  . Tonsillectomy   . Vasectomy   . Sinus signs   . Radial nerve     Family History  Problem Relation Age of  Onset  . Heart attack Father 71    Died with MI  . Heart failure Mother 40    Died age 40   Social History:  reports that he quit smoking about 5 years ago. He does not have any smokeless tobacco history on file. His alcohol and drug histories not on file.  Allergies:  Allergies  Allergen Reactions  . Doxycycline Hyclate     REACTION: whelps, itching  . Penicillins     Medications Prior to Admission  Medication Sig Dispense Refill  . aspirin 81 MG tablet Take 81 mg by mouth daily.      [name] atorvastatin (LIPITOR) 40 MG tablet Take 40 mg by mouth daily.      . hydrochlorothiazide (HYDRODIURIL) 25 MG tablet Take 25 mg by mouth daily.      [name] levothyroxine (SYNTHROID, LEVOTHROID) 100 MCG tablet Take 100 mcg by mouth daily.      . mometasone (NASONEX) 50 MCG/ACT nasal spray Place 2 sprays into the nose daily.      [name] omeprazole (PRILOSEC) 40 MG capsule Take 40 mg by mouth daily.        No results found for this or any previous visit (from the past 48 hour(s)).  No results found.   Pertinent items are noted in HPI.  Blood pressure 132/80, pulse 52, temperature 98.2 F (  36.8 C), temperature source Oral, resp. rate 18, height 5\' 10"  (1.778 m), weight 95.437 kg (210 lb 6.4 oz), SpO2 97.00%.  General appearance: alert, cooperative and appears stated age Head: Normocephalic, without obvious abnormality Neck: no adenopathy Resp: clear to auscultation bilaterally Cardio: regular rate and rhythm, S1, S2 normal, no murmur, click, rub or gallop GI: soft, non-tender; bowel sounds normal; no masses,  no organomegaly Extremities: extremities normal, atraumatic, no cyanosis or edema Pulses: 2+ and symmetric Skin: Skin color, texture, turgor normal. No rashes or lesions Neurologic: Grossly normal Incision/Wound: na  Assessment/Plan Diagnosis: S/p excision neuroma radial nerve.  The pre, peri and post op course are discussed along with risks and complications.  He is aware there is no  guarantee with surgery, possibility of infection, recurrence, injury to arteries, nerves and tendons, incomplete relief of symptoms, with resection and burying in the brachioradialis left side and dystrophy. This will be scheduled as an outpatient.  Parlee Amescua R 06/25/2012, 10:58 AM

## 2012-06-26 ENCOUNTER — Encounter (HOSPITAL_BASED_OUTPATIENT_CLINIC_OR_DEPARTMENT_OTHER): Payer: Self-pay | Admitting: Orthopedic Surgery

## 2012-06-26 LAB — POCT I-STAT, CHEM 8
Chloride: 109 mEq/L (ref 96–112)
HCT: 45 % (ref 39.0–52.0)
Potassium: 6.4 mEq/L (ref 3.5–5.1)

## 2012-06-26 NOTE — Op Note (Signed)
Jeffrey Hooper, Jeffrey Hooper NO.:  [id number]  MEDICAL RECORD NO.:  [id number]  LOCATION:                                 FACILITY:  PHYSICIAN:  Cindee Salt, M.D.            DATE OF BIRTH:  DATE OF PROCEDURE:  06/25/2012 DATE OF DISCHARGE:                              OPERATIVE REPORT   PREOPERATIVE DIAGNOSIS:  Radial nerve neuritis, left arm.  POSTOPERATIVE DIAGNOSIS:  Radial nerve neuritis, left arm.  OPERATION:  Resection of radial nerve with burying of brachioradialis, left arm.  SURGEON:  Cindee Salt, M.D.  ANESTHESIA:  Upper arm IV regional with sedation.  ANESTHESIOLOGIST:  Germaine Pomfret, M.D.  HISTORY:  The patient is a 51 year old male suffered a laceration to the radial side of his left hand resulting in a laceration of his radial nerve.  This underwent repair with repair of the nerve and placement of NeuraGen tube.  He has not had response to this.  He continues to have pain, desirous of resection, burying of the nerve.  He has had block done, which resulted resolution of his symptoms despite the numbness and tingling.  He is desirous of proceeding.  He is aware that there is no guarantee with the surgery; possibility of infection; recurrence of injury to arteries, nerves, tendons; incomplete relief of symptoms and dystrophy.  In the preoperative area, the patient is seen, the extremity marked by both the patient and surgeon, and antibiotic given.  PROCEDURE:  The patient was brought to the operating room where an upper arm IV regional anesthetic was carried out without difficulty.  Left-arm free, supine position.  He was prepped using ChloraPrep, supine position, left arm free.  A 3-minute dry time was allowed.  Time-out taken, confirming the patient and procedure.  After adequate anesthesia was afforded with the upper arm IV regional anesthetic, a longitudinal incision was made over the area of first dorsal compartment extensor tendons,  carried down through the subcutaneous tissue.  Multiple branches of radial nerve were identified.  This was isolated from the surrounding subcutaneous tissue.  A separate incision was then made more proximally at the brachioradialis and exited the radial nerve.  Again, this was carried down through the subcutaneous tissue with longitudinal incision.  The nerve was identified and exited from beneath the brachioradialis with a 30-gauge monofilament wire.  A tunnel was made, I used a cheese cutter to isolate the nerve distally, this was then sutured three times with Vicryl sutures and progressive increased tension.  The nerve was transected distally on all its branches.  The nerve freed up proximally.  A Carroll tendon retriever was then used to place the nerve into the brachioradialis muscle.  No portion of the nerve was exposed distally.  The entire nerve was beneath the muscle prior to transection of the nerve.  After isolation, each was bathed in 0.25% Marcaine without epinephrine.  Wounds were then irrigated with saline.  Subcutaneous tissue was closed with interrupted 4-0 Vicryl and the skin with subcuticular 4-0 Vicryl Rapide.  Sterile compressive dressing was applied, approximately 6 mL of total Marcaine was used.  A sterile compressive dressing  was applied.  On deflation of the tourniquet, all fingers were immediately pinked.  He was taken to the recovery room for observation in satisfactory condition.  He will be discharged home to return to the [facility] in 1 week, on Percocet.          ______________________________ Cindee Salt, M.D.     GK/MEDQ  D:  06/25/2012  T:  06/26/2012  Job:  [id number]

## 2012-10-29 ENCOUNTER — Encounter: Payer: Self-pay | Admitting: Nurse Practitioner

## 2012-10-29 ENCOUNTER — Ambulatory Visit (INDEPENDENT_AMBULATORY_CARE_PROVIDER_SITE_OTHER): Payer: BC Managed Care – PPO | Admitting: Nurse Practitioner

## 2012-10-29 VITALS — BP 142/80 | HR 52 | Temp 97.0°F | Ht 70.0 in | Wt 214.0 lb

## 2012-10-29 DIAGNOSIS — K219 Gastro-esophageal reflux disease without esophagitis: Secondary | ICD-10-CM

## 2012-10-29 DIAGNOSIS — N411 Chronic prostatitis: Secondary | ICD-10-CM

## 2012-10-29 DIAGNOSIS — E039 Hypothyroidism, unspecified: Secondary | ICD-10-CM | POA: Insufficient documentation

## 2012-10-29 DIAGNOSIS — I10 Essential (primary) hypertension: Secondary | ICD-10-CM

## 2012-10-29 DIAGNOSIS — E785 Hyperlipidemia, unspecified: Secondary | ICD-10-CM | POA: Insufficient documentation

## 2012-10-29 LAB — COMPLETE METABOLIC PANEL WITH GFR
AST: 26 U/L (ref 0–37)
Alkaline Phosphatase: 69 U/L (ref 39–117)
BUN: 15 mg/dL (ref 6–23)
Creat: 1.27 mg/dL (ref 0.50–1.35)

## 2012-10-29 LAB — THYROID PANEL WITH TSH: Free Thyroxine Index: 4.2 — ABNORMAL HIGH (ref 1.0–3.9)

## 2012-10-29 NOTE — Patient Instructions (Signed)
1. Other and unspecified hyperlipidemia Low fat diet exercise  2. Unspecified hypothyroidism - NMR Lipoprofile with Lipids  3. GERD (gastroesophageal reflux disease) Avoid spicy and fatty foods Do not est 2 hrs prior to bedtime Avoid caffeine and alcohol  4. Prostatitis, chronic  5. Essential hypertension, benign Low NA+ in diet - COMPLETE METABOLIC PANEL WITH GFR Continue all meds Labs pending

## 2012-10-29 NOTE — Addendum Note (Signed)
Addended by: Roselyn Reef on: 10/29/2012 09:21 AM   Modules accepted: Orders

## 2012-10-29 NOTE — Progress Notes (Signed)
Subjective:    Patient ID: GARDINER ESPANA, male    DOB: [birth year], 52 y.o.   MRN: [id number]  Hypertension This is a chronic problem. The current episode started more than 1 year ago. The problem has been waxing and waning since onset. The problem is controlled. Associated symptoms include chest pain (only after eating) and headaches. Pertinent negatives include no blurred vision, palpitations, peripheral edema or shortness of breath. There are no associated agents to hypertension. Risk factors for coronary artery disease include dyslipidemia, male gender and sedentary lifestyle. Past treatments include diuretics. The current treatment provides moderate improvement. Compliance problems include diet and exercise.   Hyperlipidemia This is a chronic problem. The current episode started more than 1 year ago. The problem is controlled. Recent lipid tests were reviewed and are normal. He has no history of diabetes. Factors aggravating his hyperlipidemia include fatty foods. Associated symptoms include chest pain (only after eating). Pertinent negatives include no leg pain, myalgias or shortness of breath. Current antihyperlipidemic treatment includes statins. The current treatment provides significant improvement of lipids. Compliance problems include adherence to diet and adherence to exercise.  Risk factors for coronary artery disease include hypertension and male sex.  Gastrophageal Reflux He complains of chest pain (only after eating). He reports no coughing, no heartburn or no hoarse voice. This is a chronic problem. The current episode started more than 1 year ago. The problem occurs frequently. The problem has been unchanged. The symptoms are aggravated by caffeine, certain foods and lying down. Pertinent negatives include no weight loss. Risk factors include ETOH use. He has tried a PPI for the symptoms. The treatment provided moderate relief.   Current outpatient prescriptions:aspirin 81 MG tablet,  Take 81 mg by mouth daily., Disp: , Rfl: ;  atorvastatin (LIPITOR) 40 MG tablet, Take 40 mg by mouth daily., Disp: , Rfl: ;  cadexomer iodine (IODOSORB) 0.9 % gel, Apply topically daily as needed for wound care., Disp: , Rfl: ;  gabapentin (NEURONTIN) 300 MG capsule, Take 300 mg by mouth 3 (three) times daily., Disp: , Rfl:  hydrochlorothiazide (HYDRODIURIL) 25 MG tablet, Take 25 mg by mouth daily., Disp: , Rfl: ;  levothyroxine (SYNTHROID, LEVOTHROID) 100 MCG tablet, Take 100 mcg by mouth daily., Disp: , Rfl: ;  lidocaine (LIDODERM) 5 %, Place 1 patch onto the skin daily. Remove & Discard patch within 12 hours or as directed by MD, Disp: , Rfl: ;  mometasone (NASONEX) 50 MCG/ACT nasal spray, Place 2 sprays into the nose daily., Disp: , Rfl:  omeprazole (PRILOSEC) 40 MG capsule, Take 40 mg by mouth daily., Disp: , Rfl: ;  oxyCODONE-acetaminophen (PERCOCET) 7.5-325 MG per tablet, Take 1 tablet by mouth every 4 (four) hours as needed for pain., Disp: 30 tablet, Rfl: 0 Patient currently not taking percocet.  Allergies  Allergen Reactions  . Doxycycline Hyclate     REACTION: whelps, itching  . Fentanyl   . Penicillins     Outpatient Encounter Prescriptions as of 10/29/2012  Medication Sig Dispense Refill  . aspirin 81 MG tablet Take 81 mg by mouth daily.      [name] atorvastatin (LIPITOR) 40 MG tablet Take 40 mg by mouth daily.      . cadexomer iodine (IODOSORB) 0.9 % gel Apply topically daily as needed for wound care.      . gabapentin (NEURONTIN) 300 MG capsule Take 300 mg by mouth 3 (three) times daily.      . hydrochlorothiazide (HYDRODIURIL) 25 MG tablet Take 25  mg by mouth daily.      [name] levothyroxine (SYNTHROID, LEVOTHROID) 100 MCG tablet Take 100 mcg by mouth daily.      [name] lidocaine (LIDODERM) 5 % Place 1 patch onto the skin daily. Remove & Discard patch within 12 hours or as directed by MD      . mometasone (NASONEX) 50 MCG/ACT nasal spray Place 2 sprays into the nose daily.      [name] omeprazole  (PRILOSEC) 40 MG capsule Take 40 mg by mouth daily.      [name] oxyCODONE-acetaminophen (PERCOCET) 7.5-325 MG per tablet Take 1 tablet by mouth every 4 (four) hours as needed for pain.  30 tablet  0   No facility-administered encounter medications on file as of 10/29/2012.    Past Medical History  Diagnosis Date  . Hypertension   . Pulmonary nodule   . Hypothyroid   . GERD (gastroesophageal reflux disease)   . Hiatal hernia   . Prostatism   . Seasonal allergies     Past Surgical History  Procedure Laterality Date  . Back surgery      T12 - L1  . Spinal fusion      C6-7  . Tonsillectomy    . Vasectomy    . Sinus signs    . Radial nerve    . Mass excision  06/25/2012    Procedure: EXCISION MASS;  Surgeon: Nicki Reaper, MD;  Location: [facility];  Service: Orthopedics;  Laterality: Left;  transection of NEUROMA, BURYING RADIAL NERVE IN BRACHIORADIALIS LEFT SIDE    History   Social History  . Marital Status: Married    Spouse Name: N/A    Number of Children: 3  . Years of Education: N/A   Occupational History  . [employer] for [employer]    Social History Main Topics  . Smoking status: Former Smoker    Quit date: 11/28/2006  . Smokeless tobacco: Not on file  . Alcohol Use: 0.5 oz/week    1 drink(s) per week  . Drug Use: No  . Sexually Active: Not on file   Other Topics Concern  . Not on file   Social History Narrative   Lives at home with wife, new 10 year old adopted boy.           Review of Systems  Constitutional: Negative.  Negative for weight loss.  HENT: Negative for hoarse voice.   Eyes: Negative.  Negative for blurred vision.  Respiratory: Negative for cough and shortness of breath.   Cardiovascular: Positive for chest pain (only after eating). Negative for palpitations.  Gastrointestinal: Negative.  Negative for heartburn.  Musculoskeletal: Negative.  Negative for myalgias.  Skin: Negative.   Neurological: Positive for headaches.   Psychiatric/Behavioral: Negative.        Objective:   Physical Exam  Constitutional: He is oriented to person, place, and time. He appears well-developed and well-nourished.  HENT:  Head: Normocephalic.  Nose: Nose normal.  Mouth/Throat: Oropharynx is clear and moist.  Eyes: Conjunctivae and EOM are normal. Pupils are equal, round, and reactive to light.  Neck: Normal range of motion. Neck supple. No JVD present. Carotid bruit is not present.  Cardiovascular: Normal rate, regular rhythm, normal heart sounds and intact distal pulses.  Exam reveals no gallop and no friction rub.   No murmur heard. Pulmonary/Chest: Effort normal and breath sounds normal. He has no wheezes. He has no rales.  Abdominal: Soft. He exhibits no mass. There is  tenderness (RLQ on deep palpation). There is no rebound and no guarding.  Musculoskeletal: Normal range of motion.  Neurological: He is alert and oriented to person, place, and time. He has normal reflexes. No cranial nerve deficit.  Skin: Skin is warm and dry.  Psychiatric: He has a normal mood and affect. His behavior is normal. Judgment and thought content normal.  BP 142/80  Pulse 52  Temp(Src) 97 F (36.1 C) (Oral)  Ht 5\' 10"  (1.778 m)  Wt 214 lb (97.07 kg)  BMI 30.71 kg/m2         Assessment & Plan:  1. Other and unspecified hyperlipidemia Low fat diet exercise  2. Unspecified hypothyroidism - NMR Lipoprofile with Lipids  3. GERD (gastroesophageal reflux disease) Avoid spicy and fatty foods Do not est 2 hrs prior to bedtime Avoid caffeine and alcohol  4. Prostatitis, chronic  5. Essential hypertension, benign Low NA+ in diet - COMPLETE METABOLIC PANEL WITH GFR Continue all meds Labs pending  Mary-Margaret Daphine Deutscher, FNP

## 2012-10-31 LAB — NMR LIPOPROFILE WITH LIPIDS
Cholesterol, Total: 136 mg/dL (ref <200)
HDL Size: 8.1 nm — ABNORMAL LOW (ref >=9.2)
LDL (calc): 74 mg/dL (ref <100)
LDL Particle Number: 1209 nmol/L — ABNORMAL HIGH (ref <1000)
LP-IR Score: 54 — ABNORMAL HIGH (ref <=45)
Small LDL Particle Number: 721 nmol/L — ABNORMAL HIGH (ref <=527)
Triglycerides: 103 mg/dL (ref <150)
VLDL Size: 44.1 nm (ref >=46.6)

## 2012-11-07 ENCOUNTER — Telehealth: Payer: Self-pay | Admitting: Physician Assistant

## 2012-11-07 ENCOUNTER — Ambulatory Visit (INDEPENDENT_AMBULATORY_CARE_PROVIDER_SITE_OTHER): Payer: BC Managed Care – PPO | Admitting: General Practice

## 2012-11-07 ENCOUNTER — Encounter: Payer: Self-pay | Admitting: General Practice

## 2012-11-07 VITALS — BP 142/84 | HR 63 | Temp 98.4°F | Ht 70.0 in | Wt 215.0 lb

## 2012-11-07 DIAGNOSIS — J329 Chronic sinusitis, unspecified: Secondary | ICD-10-CM

## 2012-11-07 DIAGNOSIS — R52 Pain, unspecified: Secondary | ICD-10-CM

## 2012-11-07 DIAGNOSIS — J029 Acute pharyngitis, unspecified: Secondary | ICD-10-CM

## 2012-11-07 LAB — POCT RAPID STREP A (OFFICE): Rapid Strep A Screen: NEGATIVE

## 2012-11-07 MED ORDER — AZITHROMYCIN 250 MG PO TABS: ORAL_TABLET | ORAL | Status: DC

## 2012-11-07 NOTE — Patient Instructions (Addendum)
Viral and Bacterial Pharyngitis Pharyngitis is soreness (inflammation) or infection of the pharynx. It is also called a sore throat. CAUSES  Most sore throats are caused by viruses and are part of a cold. However, some sore throats are caused by strep and other bacteria. Sore throats can also be caused by post nasal drip from draining sinuses, allergies and sometimes from sleeping with an open mouth. Infectious sore throats can be spread from person to person by coughing, sneezing and sharing cups or eating utensils. TREATMENT  Sore throats that are viral usually last 3-4 days. Viral illness will get better without medications (antibiotics). Strep throat and other bacterial infections will usually begin to get better about 24-48 hours after you begin to take antibiotics. HOME CARE INSTRUCTIONS   If the caregiver feels there is a bacterial infection or if there is a positive strep test, they will prescribe an antibiotic. The full course of antibiotics must be taken. If the full course of antibiotic is not taken, you or your child may become ill again. If you or your child has strep throat and do not finish all of the medication, serious heart or kidney diseases may develop.  Drink enough water and fluids to keep your urine clear or pale yellow.  Only take over-the-counter or prescription medicines for pain, discomfort or fever as directed by your caregiver.  Get lots of rest.  Gargle with salt water ( tsp. of salt in a glass of water) as often as every 1-2 hours as you need for comfort.  Hard candies may soothe the throat if individual is not at risk for choking. Throat sprays or lozenges may also be used. SEEK MEDICAL CARE IF:   Large, tender lumps in the neck develop.  A rash develops.  Green, yellow-brown or bloody sputum is coughed up.  Your baby is older than 3 months with a rectal temperature of 100.5 F (38.1 C) or higher for more than 1 day. SEEK IMMEDIATE MEDICAL CARE IF:   A  stiff neck develops.  You or your child are drooling or unable to swallow liquids.  You or your child are vomiting, unable to keep medications or liquids down.  You or your child has severe pain, unrelieved with recommended medications.  You or your child are having difficulty breathing (not due to stuffy nose).  You or your child are unable to fully open your mouth.  You or your child develop redness, swelling, or severe pain anywhere on the neck.  You have a fever.  Your baby is older than 3 months with a rectal temperature of 102 F (38.9 C) or higher.  Your baby is 37 months old or younger with a rectal temperature of 100.4 F (38 C) or higher. MAKE SURE YOU:   Understand these instructions.  Will watch your condition.  Will get help right away if you are not doing well or get worse. Document Released: 07/23/2005 Document Revised: 10/15/2011 Document Reviewed: 10/20/2007 [facility] Patient Information 2013 [place], [place]. Sinusitis Sinusitis is redness, soreness, and swelling (inflammation) of the paranasal sinuses. Paranasal sinuses are air pockets within the bones of your face (beneath the eyes, the middle of the forehead, or above the eyes). In healthy paranasal sinuses, mucus is able to drain out, and air is able to circulate through them by way of your nose. However, when your paranasal sinuses are inflamed, mucus and air can become trapped. This can allow bacteria and other germs to grow and cause infection. Sinusitis can develop  quickly and last only a short time (acute) or continue over a long period (chronic). Sinusitis that lasts for more than 12 weeks is considered chronic.  CAUSES  Causes of sinusitis include:  Allergies.  Structural abnormalities, such as displacement of the cartilage that separates your nostrils (deviated septum), which can decrease the air flow through your nose and sinuses and affect sinus drainage.  Functional abnormalities, such as when the  small hairs (cilia) that line your sinuses and help remove mucus do not work properly or are not present. SYMPTOMS  Symptoms of acute and chronic sinusitis are the same. The primary symptoms are pain and pressure around the affected sinuses. Other symptoms include:  Upper toothache.  Earache.  Headache.  Bad breath.  Decreased sense of smell and taste.  A cough, which worsens when you are lying flat.  Fatigue.  Fever.  Thick drainage from your nose, which often is green and may contain pus (purulent).  Swelling and warmth over the affected sinuses. DIAGNOSIS  Your caregiver will perform a physical exam. During the exam, your caregiver may:  Look in your nose for signs of abnormal growths in your nostrils (nasal polyps).  Tap over the affected sinus to check for signs of infection.  View the inside of your sinuses (endoscopy) with a special imaging device with a light attached (endoscope), which is inserted into your sinuses. If your caregiver suspects that you have chronic sinusitis, one or more of the following tests may be recommended:  Allergy tests.  Nasal culture A sample of mucus is taken from your nose and sent to a lab and screened for bacteria.  Nasal cytology A sample of mucus is taken from your nose and examined by your caregiver to determine if your sinusitis is related to an allergy. TREATMENT  Most cases of acute sinusitis are related to a viral infection and will resolve on their own within 10 days. Sometimes medicines are prescribed to help relieve symptoms (pain medicine, decongestants, nasal steroid sprays, or saline sprays).  However, for sinusitis related to a bacterial infection, your caregiver will prescribe antibiotic medicines. These are medicines that will help kill the bacteria causing the infection.  Rarely, sinusitis is caused by a fungal infection. In theses cases, your caregiver will prescribe antifungal medicine. For some cases of chronic  sinusitis, surgery is needed. Generally, these are cases in which sinusitis recurs more than 3 times per year, despite other treatments. HOME CARE INSTRUCTIONS   Drink plenty of water. Water helps thin the mucus so your sinuses can drain more easily.  Use a humidifier.  Inhale steam 3 to 4 times a day (for example, sit in the bathroom with the shower running).  Apply a warm, moist washcloth to your face 3 to 4 times a day, or as directed by your caregiver.  Use saline nasal sprays to help moisten and clean your sinuses.  Take over-the-counter or prescription medicines for pain, discomfort, or fever only as directed by your caregiver. SEEK IMMEDIATE MEDICAL CARE IF:  You have increasing pain or severe headaches.  You have nausea, vomiting, or drowsiness.  You have swelling around your face.  You have vision problems.  You have a stiff neck.  You have difficulty breathing. MAKE SURE YOU:   Understand these instructions.  Will watch your condition.  Will get help right away if you are not doing well or get worse. Document Released: 07/23/2005 Document Revised: 10/15/2011 Document Reviewed: 08/07/2011 [facility] Patient Information 2013 [place], [place].

## 2012-11-07 NOTE — Telephone Encounter (Signed)
APPT MADE WITH Jeffrey Hooper

## 2012-11-07 NOTE — Progress Notes (Signed)
  Subjective:    Patient ID: Jeffrey Hooper, male    DOB: 1961-07-06, 52 y.o.   MRN: [id number]  Cough This is a new problem. The current episode started yesterday. The problem has been unchanged. The problem occurs every few minutes. The cough is productive of brown sputum. Associated symptoms include a fever, headaches, myalgias, postnasal drip and a sore throat. Pertinent negatives include no chest pain, chills, ear congestion, ear pain, heartburn, nasal congestion, rash or shortness of breath. Nothing aggravates the symptoms. Treatments tried: cough drops. His past medical history is significant for pneumonia. There is no history of asthma.  Fever  This is a new problem. The current episode started yesterday. The problem has been unchanged. Maximum temperature: temperture not check , but felt very warm. Associated symptoms include coughing, headaches and a sore throat. Pertinent negatives include no chest pain, ear pain or rash. He has tried acetaminophen for the symptoms. The treatment provided no relief.  Sore Throat  This is a new problem. The current episode started yesterday. The problem has been gradually worsening. Neither side of throat is experiencing more pain than the other. The pain is at a severity of 4/10. The pain is mild. Associated symptoms include coughing and headaches. Pertinent negatives include no ear pain or shortness of breath.      Review of Systems  Constitutional: Positive for fever. Negative for chills.  HENT: Positive for sore throat and postnasal drip. Negative for ear pain.   Respiratory: Positive for cough. Negative for chest tightness and shortness of breath.   Cardiovascular: Negative for chest pain and palpitations.  Gastrointestinal: Negative for heartburn.  Genitourinary: Negative for difficulty urinating.  Musculoskeletal: Positive for myalgias.  Skin: Negative for rash.  Neurological: Positive for dizziness and headaches.  Psychiatric/Behavioral:  Negative.        Objective:   Physical Exam  Constitutional: He is oriented to person, place, and time. He appears well-developed and well-nourished.  HENT:  Nose: Right sinus exhibits maxillary sinus tenderness. Left sinus exhibits maxillary sinus tenderness.  Cardiovascular: Normal rate, regular rhythm and normal heart sounds.   Pulmonary/Chest: Effort normal and breath sounds normal.  Neurological: He is alert and oriented to person, place, and time.  Skin: Skin is warm and dry.  Psychiatric: He has a normal mood and affect.   Results for orders placed in visit on 11/07/12  POCT INFLUENZA A/B      Result Value Range   Influenza A, POC Negative     Influenza B, POC Negative    POCT RAPID STREP A (OFFICE)      Result Value Range   Rapid Strep A Screen Negative  Negative          Assessment & Plan:  Continue antibiotics even if feeling better Increase fluid intake Motrin or tylenol OTC Throat lozenges if help New toothbrush in 3 days Proper handwashing RTO if symptoms worsen or unresolved Patient verbalized understanding and denies questions   Raymon Mutton, FNP-C

## 2012-11-26 ENCOUNTER — Encounter: Payer: Self-pay | Admitting: *Deleted

## 2013-01-20 ENCOUNTER — Ambulatory Visit: Payer: BC Managed Care – PPO | Admitting: Family Medicine

## 2013-01-29 ENCOUNTER — Ambulatory Visit: Payer: BC Managed Care – PPO | Admitting: Family Medicine

## 2013-03-06 ENCOUNTER — Ambulatory Visit (INDEPENDENT_AMBULATORY_CARE_PROVIDER_SITE_OTHER): Payer: BC Managed Care – PPO | Admitting: Family Medicine

## 2013-03-06 ENCOUNTER — Encounter: Payer: Self-pay | Admitting: Family Medicine

## 2013-03-06 VITALS — BP 155/90 | HR 51 | Temp 97.8°F | Ht 71.0 in | Wt 210.0 lb

## 2013-03-06 DIAGNOSIS — K219 Gastro-esophageal reflux disease without esophagitis: Secondary | ICD-10-CM

## 2013-03-06 DIAGNOSIS — H9313 Tinnitus, bilateral: Secondary | ICD-10-CM | POA: Insufficient documentation

## 2013-03-06 DIAGNOSIS — I10 Essential (primary) hypertension: Secondary | ICD-10-CM

## 2013-03-06 DIAGNOSIS — H9319 Tinnitus, unspecified ear: Secondary | ICD-10-CM

## 2013-03-06 DIAGNOSIS — E039 Hypothyroidism, unspecified: Secondary | ICD-10-CM

## 2013-03-06 DIAGNOSIS — E785 Hyperlipidemia, unspecified: Secondary | ICD-10-CM

## 2013-03-06 MED ORDER — LEVOTHYROXINE SODIUM 100 MCG PO TABS: 100.0000 ug | ORAL_TABLET | Freq: Every day | ORAL | Status: DC

## 2013-03-06 MED ORDER — ATORVASTATIN CALCIUM 40 MG PO TABS: 40.0000 mg | ORAL_TABLET | Freq: Every day | ORAL | Status: DC

## 2013-03-06 MED ORDER — OMEPRAZOLE 40 MG PO CPDR: 40.0000 mg | DELAYED_RELEASE_CAPSULE | Freq: Every day | ORAL | Status: DC

## 2013-03-06 MED ORDER — AMLODIPINE BESYLATE 5 MG PO TABS: 5.0000 mg | ORAL_TABLET | Freq: Every day | ORAL | Status: DC

## 2013-03-06 MED ORDER — HYDROCHLOROTHIAZIDE 25 MG PO TABS: 25.0000 mg | ORAL_TABLET | Freq: Every day | ORAL | Status: DC

## 2013-03-06 NOTE — Progress Notes (Signed)
Patient ID: Jeffrey Hooper, male   DOB: 03-12-1961, 52 y.o.   MRN: [id number] SUBJECTIVE: CC: Chief Complaint  Patient presents with  . Hypothyroidism  . Hypertension  . Gastrophageal Reflux    HPI: Patient is here for follow up of hypertension:/huypothyroidism/GERD denies Headache;deniesChest Pain;denies weakness;denies Shortness of Breath or Orthopnea;denies Visual changes;denies palpitations;denies cough;denies pedal edema;denies symptoms of TIA or stroke; admits to Compliance with medications. denies Problems with medications.  Supper : chicken dumplings Lunch:  1/2 a chicken sub Breakfast: not usually. A pack of nabbs.  Past Medical History  Diagnosis Date  . Hypertension   . Pulmonary nodule   . Hypothyroid   . GERD (gastroesophageal reflux disease)   . Hiatal hernia   . Prostatism   . Seasonal allergies    Past Surgical History  Procedure Laterality Date  . Back surgery      T12 - L1  . Spinal fusion      C6-7  . Tonsillectomy    . Vasectomy    . Sinus signs    . Radial nerve    . Mass excision  06/25/2012    Procedure: EXCISION MASS;  Surgeon: Nicki Reaper, MD;  Location: [facility];  Service: Orthopedics;  Laterality: Left;  transection of NEUROMA, BURYING RADIAL NERVE IN BRACHIORADIALIS LEFT SIDE   History   Social History  . Marital Status: Married    Spouse Name: N/A    Number of Children: 3  . Years of Education: N/A   Occupational History  . [employer] for [employer]    Social History Main Topics  . Smoking status: Former Smoker    Quit date: 11/28/2006  . Smokeless tobacco: Not on file  . Alcohol Use: 0.5 oz/week    1 drink(s) per week  . Drug Use: No  . Sexually Active: Not on file   Other Topics Concern  . Not on file   Social History Narrative   Lives at home with wife, new 21 year old adopted boy.     Family History  Problem Relation Age of Onset  . Heart attack Father 55    Died with MI  . Heart failure  Mother 39    Died age 40   Current Outpatient Prescriptions on File Prior to Visit  Medication Sig Dispense Refill  . aspirin 81 MG tablet Take 81 mg by mouth daily.      [name] atorvastatin (LIPITOR) 40 MG tablet Take 40 mg by mouth daily.      . cadexomer iodine (IODOSORB) 0.9 % gel Apply topically daily as needed for wound care.      . gabapentin (NEURONTIN) 300 MG capsule Take 300 mg by mouth 3 (three) times daily.      . hydrochlorothiazide (HYDRODIURIL) 25 MG tablet Take 25 mg by mouth daily.      [name] levothyroxine (SYNTHROID, LEVOTHROID) 100 MCG tablet Take 100 mcg by mouth daily.      [name] lidocaine (LIDODERM) 5 % Place 1 patch onto the skin daily. Remove & Discard patch within 12 hours or as directed by MD      . mometasone (NASONEX) 50 MCG/ACT nasal spray Place 2 sprays into the nose daily.      [name] omeprazole (PRILOSEC) 40 MG capsule Take 40 mg by mouth daily.      [name] oxyCODONE-acetaminophen (PERCOCET) 7.5-325 MG per tablet Take 1 tablet by mouth every 4 (four) hours as needed for pain.  30 tablet  0  . azithromycin (ZITHROMAX Z-PAK) 250 MG tablet Take as directed  6 each  0   No current facility-administered medications on file prior to visit.   Allergies  Allergen Reactions  . Doxycycline Hyclate     REACTION: whelps, itching  . Fentanyl   . Penicillins    Immunization History  Administered Date(s) Administered  . Influenza Whole 05/02/2012  . Td 04/12/2010   Prior to Admission medications   Medication Sig Start Date End Date Taking? Authorizing Provider  aspirin 81 MG tablet Take 81 mg by mouth daily.   Yes Historical Provider, MD  atorvastatin (LIPITOR) 40 MG tablet Take 40 mg by mouth daily.   Yes Historical Provider, MD  cadexomer iodine (IODOSORB) 0.9 % gel Apply topically daily as needed for wound care.   Yes Historical Provider, MD  gabapentin (NEURONTIN) 300 MG capsule Take 300 mg by mouth 3 (three) times daily.   Yes Historical Provider, MD  hydrochlorothiazide  (HYDRODIURIL) 25 MG tablet Take 25 mg by mouth daily.   Yes Historical Provider, MD  levothyroxine (SYNTHROID, LEVOTHROID) 100 MCG tablet Take 100 mcg by mouth daily.   Yes Historical Provider, MD  lidocaine (LIDODERM) 5 % Place 1 patch onto the skin daily. Remove & Discard patch within 12 hours or as directed by MD   Yes Historical Provider, MD  mometasone (NASONEX) 50 MCG/ACT nasal spray Place 2 sprays into the nose daily.   Yes Historical Provider, MD  omeprazole (PRILOSEC) 40 MG capsule Take 40 mg by mouth daily.   Yes Historical Provider, MD  oxyCODONE-acetaminophen (PERCOCET) 7.5-325 MG per tablet Take 1 tablet by mouth every 4 (four) hours as needed for pain. 06/25/12  Yes Nicki Reaper, MD  pregabalin (LYRICA) 50 MG capsule Take 50 mg by mouth daily.   Yes Historical Provider, MD  azithromycin (ZITHROMAX Z-PAK) 250 MG tablet Take as directed 11/07/12   Coralie Keens, FNP    ROS: As above in the HPI. All other systems are stable or negative.  OBJECTIVE: APPEARANCE:  Patient in no acute distress.The patient appeared well nourished and normally developed. Acyanotic. Waist: VITAL SIGNS:BP 155/90  Pulse 51  Temp(Src) 97.8 F (36.6 C) (Oral)  Ht 5\' 11"  (1.803 m)  Wt 210 lb (95.255 kg)  BMI 29.3 kg/m2 WM BP 155/88  SKIN: warm and  Dry without overt rashes, tattoos and scars  HEAD and Neck: without JVD, Head and scalp: normal Eyes:No scleral icterus. Fundi normal, eye movements normal. Ears: Auricle normal, canal normal, Tympanic membranes normal, insufflation normal. Nose: normal Throat: normal Neck & thyroid: normal  CHEST & LUNGS: Chest wall: normal Lungs: Clear  CVS: Reveals the PMI to be normally located. Regular rhythm, First and Second Heart sounds are normal,  absence of murmurs, rubs or gallops. Peripheral vasculature: Radial pulses: normal Dorsal pedis pulses: normal Posterior pulses: normal  ABDOMEN:  Appearance: normal Benign, no organomegaly, no  masses, no Abdominal Aortic enlargement. No Guarding , no rebound. No Bruits. Bowel sounds: normal  RECTAL: N/A GU: N/A  EXTREMETIES: nonedematous. Both Femoral and Pedal pulses are normal.  MUSCULOSKELETAL:  Spine: normal Joints: intact  NEUROLOGIC: oriented to time,place and person; nonfocal. Strength is normal Sensory is normal Reflexes are normal Cranial Nerves are normal.  Labs through ASSESSMENT: HYPERTENSION - Plan: amLODipine (NORVASC) 5 MG tablet, hydrochlorothiazide (HYDRODIURIL) 25 MG tablet, CANCELED: COMPLETE METABOLIC PANEL WITH GFR  Other and unspecified hyperlipidemia - Plan: atorvastatin (LIPITOR) 40 MG tablet  Unspecified hypothyroidism - Plan:  levothyroxine (SYNTHROID, LEVOTHROID) 100 MCG tablet, CANCELED: COMPLETE METABOLIC PANEL WITH GFR, CANCELED: NMR Lipoprofile with Lipids  Tinnitus of both ears - Plan: Ambulatory referral to ENT  GERD (gastroesophageal reflux disease) - Plan: omeprazole (PRILOSEC) 40 MG capsule  BP not at goal and looking back it has always been a little high.  PLAN:  Orders Placed This Encounter  Procedures  . Ambulatory referral to ENT    Referral Priority:  Routine    Referral Type:  Consultation    Referral Reason:  Specialty Services Required    Requested Specialty:  Otolaryngology    Number of Visits Requested:  1   Meds ordered this encounter  Medications  . pregabalin (LYRICA) 50 MG capsule    Sig: Take 50 mg by mouth daily.  [name] amLODipine (NORVASC) 5 MG tablet    Sig: Take 1 tablet (5 mg total) by mouth daily.    Dispense:  30 tablet    Refill:  3  . atorvastatin (LIPITOR) 40 MG tablet    Sig: Take 1 tablet (40 mg total) by mouth daily.    Dispense:  90 tablet    Refill:  3  . hydrochlorothiazide (HYDRODIURIL) 25 MG tablet    Sig: Take 1 tablet (25 mg total) by mouth daily.    Dispense:  90 tablet    Refill:  3  . levothyroxine (SYNTHROID, LEVOTHROID) 100 MCG tablet    Sig: Take 1 tablet (100 mcg total) by  mouth daily.    Dispense:  90 tablet    Refill:  3  . omeprazole (PRILOSEC) 40 MG capsule    Sig: Take 1 capsule (40 mg total) by mouth daily.    Dispense:  90 capsule    Refill:  3   Labs were done 01/26/2013. Through work . Hard copy reviewed and will be scanned in chart. Labs were TSH, CMP and Lipid panel were all at goal or okay.      Dr Woodroe Mode Recommendations  Diet and Exercise discussed with patient.  For nutrition information, I recommend books:  1).Eat to Live by Dr Monico Hoar. 2).Prevent and Reverse Heart Disease by Dr Suzzette Righter. 3) Dr Katherina Right Book:  Program to Reverse Diabetes  Exercise recommendations are:  If unable to walk, then the patient can exercise in a chair 3 times a day. By flapping arms like a bird gently and raising legs outwards to the front.  If ambulatory, the patient can go for walks for 30 minutes 3 times a week. Then increase the intensity and duration as tolerated.  Goal is to try to attain exercise frequency to 5 times a week.  If applicable: Best to perform resistance exercises (machines or weights) 2 days a week and cardio type exercises 3 days per week.  DASH Diet in the AVS.  Return in about 6 weeks (around 04/17/2013) for recheck BP.  Cleotha Tsang P. Modesto Charon, M.D.

## 2013-03-06 NOTE — Patient Instructions (Addendum)
    Dr Nabria Nevin's Recommendations  Diet and Exercise discussed with patient.  For nutrition information, I recommend books:  1).Eat to Live by Dr Joel Fuhrman. 2).Prevent and Reverse Heart Disease by Dr Caldwell Esselstyn. 3) Dr Neal Barnard's Book:  Program to Reverse Diabetes  Exercise recommendations are:  If unable to walk, then the patient can exercise in a chair 3 times a day. By flapping arms like a bird gently and raising legs outwards to the front.  If ambulatory, the patient can go for walks for 30 minutes 3 times a week. Then increase the intensity and duration as tolerated.  Goal is to try to attain exercise frequency to 5 times a week.  If applicable: Best to perform resistance exercises (machines or weights) 2 days a week and cardio type exercises 3 days per week.    DASH Diet The DASH diet stands for "Dietary Approaches to Stop Hypertension." It is a healthy eating plan that has been shown to reduce high blood pressure (hypertension) in as little as 14 days, while also possibly providing other significant health benefits. These other health benefits include reducing the risk of breast cancer after menopause and reducing the risk of type 2 diabetes, heart disease, colon cancer, and stroke. Health benefits also include weight loss and slowing kidney failure in patients with chronic kidney disease.  DIET GUIDELINES  Limit salt (sodium). Your diet should contain less than 1500 mg of sodium daily.  Limit refined or processed carbohydrates. Your diet should include mostly whole grains. Desserts and added sugars should be used sparingly.  Include small amounts of heart-healthy fats. These types of fats include nuts, oils, and tub margarine. Limit saturated and trans fats. These fats have been shown to be harmful in the body. CHOOSING FOODS  The following food groups are based on a 2000 calorie diet. See your Registered Dietitian for individual calorie needs. Grains and  Grain Products (6 to 8 servings daily)  Eat More Often: Whole-wheat bread, brown rice, whole-grain or wheat pasta, quinoa, popcorn without added fat or salt (air popped).  Eat Less Often: White bread, white pasta, white rice, cornbread. Vegetables (4 to 5 servings daily)  Eat More Often: Fresh, frozen, and canned vegetables. Vegetables may be raw, steamed, roasted, or grilled with a minimal amount of fat.  Eat Less Often/Avoid: Creamed or fried vegetables. Vegetables in a cheese sauce. Fruit (4 to 5 servings daily)  Eat More Often: All fresh, canned (in natural juice), or frozen fruits. Dried fruits without added sugar. One hundred percent fruit juice ( cup [237 mL] daily).  Eat Less Often: Dried fruits with added sugar. Canned fruit in light or heavy syrup. Lean Meats, Fish, and Poultry (2 servings or less daily. One serving is 3 to 4 oz [85-114 g]).  Eat More Often: Ninety percent or leaner ground beef, tenderloin, sirloin. Round cuts of beef, chicken breast, turkey breast. All fish. Grill, bake, or broil your meat. Nothing should be fried.  Eat Less Often/Avoid: Fatty cuts of meat, turkey, or chicken leg, thigh, or wing. Fried cuts of meat or fish. Dairy (2 to 3 servings)  Eat More Often: Low-fat or fat-free milk, low-fat plain or light yogurt, reduced-fat or part-skim cheese.  Eat Less Often/Avoid: Milk (whole, 2%).Whole milk yogurt. Full-fat cheeses. Nuts, Seeds, and Legumes (4 to 5 servings per week)  Eat More Often: All without added salt.  Eat Less Often/Avoid: Salted nuts and seeds, canned beans with added salt. Fats and Sweets (  limited)  Eat More Often: Vegetable oils, tub margarines without trans fats, sugar-free gelatin. Mayonnaise and salad dressings.  Eat Less Often/Avoid: Coconut oils, palm oils, butter, stick margarine, cream, half and half, cookies, candy, pie. FOR MORE INFORMATION The Dash Diet Eating Plan: www.dashdiet.org Document Released: 07/12/2011  Document Revised: 10/15/2011 Document Reviewed: 07/12/2011 [employer] Patient Information 2014 [employer].  

## 2013-03-26 ENCOUNTER — Telehealth: Payer: Self-pay | Admitting: Family Medicine

## 2013-03-27 NOTE — Telephone Encounter (Signed)
Office visit to recheck.

## 2013-03-27 NOTE — Telephone Encounter (Signed)
Left message --pt given an appt for Monday Mar 30 2013

## 2013-03-30 ENCOUNTER — Encounter: Payer: Self-pay | Admitting: Family Medicine

## 2013-03-30 ENCOUNTER — Ambulatory Visit (INDEPENDENT_AMBULATORY_CARE_PROVIDER_SITE_OTHER): Payer: BC Managed Care – PPO | Admitting: Family Medicine

## 2013-03-30 VITALS — BP 149/89 | HR 77 | Temp 97.6°F | Ht 71.0 in | Wt 208.4 lb

## 2013-03-30 DIAGNOSIS — H9313 Tinnitus, bilateral: Secondary | ICD-10-CM

## 2013-03-30 DIAGNOSIS — H9319 Tinnitus, unspecified ear: Secondary | ICD-10-CM

## 2013-03-30 DIAGNOSIS — I10 Essential (primary) hypertension: Secondary | ICD-10-CM

## 2013-03-30 DIAGNOSIS — E039 Hypothyroidism, unspecified: Secondary | ICD-10-CM

## 2013-03-30 MED ORDER — AMLODIPINE BESYLATE 10 MG PO TABS: 10.0000 mg | ORAL_TABLET | Freq: Every day | ORAL | Status: DC

## 2013-03-30 NOTE — Progress Notes (Signed)
Patient ID: JOURDAIN GUAY, male   DOB: 1960/11/10, 52 y.o.   MRN: [id number] SUBJECTIVE: CC:   Chief Complaint  Patient presents with  . Follow-up    bp reck on bp med x 3 weeks still has headaches    HPI: Saw ENT who recommended a MRI of his head due to the red flag of the tinnitus and the right ear hearing loss greater than the left. Came to recheck his BP. 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  Past Medical History  Diagnosis Date  . Hypertension   . Pulmonary nodule   . Hypothyroid   . GERD (gastroesophageal reflux disease)   . Hiatal hernia   . Prostatism   . Seasonal allergies    Past Surgical History  Procedure Laterality Date  . Back surgery      T12 - L1  . Spinal fusion      C6-7  . Tonsillectomy    . Vasectomy    . Sinus signs    . Radial nerve    . Mass excision  06/25/2012    Procedure: EXCISION MASS;  Surgeon: Nicki Reaper, MD;  Location: [facility];  Service: Orthopedics;  Laterality: Left;  transection of NEUROMA, BURYING RADIAL NERVE IN BRACHIORADIALIS LEFT SIDE   History   Social History  . Marital Status: Married    Spouse Name: N/A    Number of Children: 3  . Years of Education: N/A   Occupational History  . 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 Smoking status: Former Smoker    Quit date: 11/28/2006  . Smokeless tobacco: Not on file  . Alcohol Use: 0.5 oz/week    1 drink(s) per week  . Drug Use: No  . Sexual Activity: Not on file   Other Topics Concern  . Not on file   Social History Narrative   Lives at home with wife, new 45 year old adopted boy.     Family History  Problem Relation Age of Onset  . Heart attack Father 47    Died with MI  . Heart failure Mother 96    Died age 55   Current Outpatient Prescriptions on File Prior to Visit  Medication Sig Dispense Refill  . aspirin 81 MG tablet Take 81 mg by mouth daily.      [name] atorvastatin (LIPITOR) 40 MG tablet Take 1  tablet (40 mg total) by mouth daily.  90 tablet  3  . hydrochlorothiazide (HYDRODIURIL) 25 MG tablet Take 1 tablet (25 mg total) by mouth daily.  90 tablet  3  . levothyroxine (SYNTHROID, LEVOTHROID) 100 MCG tablet Take 1 tablet (100 mcg total) by mouth daily.  90 tablet  3  . mometasone (NASONEX) 50 MCG/ACT nasal spray Place 2 sprays into the nose daily.      [name] omeprazole (PRILOSEC) 40 MG capsule Take 1 capsule (40 mg total) by mouth daily.  90 capsule  3   No current facility-administered medications on file prior to visit.   Allergies  Allergen Reactions  . Prednisone     im depo medrol  Dizziness passed out  Ask pt before giving  . Doxycycline Hyclate     REACTION: whelps, itching  . Fentanyl   . Penicillins    Immunization History  Administered Date(s) Administered  . Influenza Whole 05/02/2012  . Td 04/12/2010   Prior to Admission medications   Medication  Sig Start Date End Date Taking? Authorizing Provider  amLODipine (NORVASC) 10 MG tablet Take 1 tablet (10 mg total) by mouth daily. 03/30/13  Yes Ileana Ladd, MD  aspirin 81 MG tablet Take 81 mg by mouth daily.   Yes Historical Provider, MD  atorvastatin (LIPITOR) 40 MG tablet Take 1 tablet (40 mg total) by mouth daily. 03/06/13  Yes Ileana Ladd, MD  hydrochlorothiazide (HYDRODIURIL) 25 MG tablet Take 1 tablet (25 mg total) by mouth daily. 03/06/13  Yes Ileana Ladd, MD  levothyroxine (SYNTHROID, LEVOTHROID) 100 MCG tablet Take 1 tablet (100 mcg total) by mouth daily. 03/06/13  Yes Ileana Ladd, MD  mometasone (NASONEX) 50 MCG/ACT nasal spray Place 2 sprays into the nose daily.   Yes Historical Provider, MD  omeprazole (PRILOSEC) 40 MG capsule Take 1 capsule (40 mg total) by mouth daily. 03/06/13  Yes Ileana Ladd, MD     ROS: As above in the HPI. All other systems are stable or negative.  OBJECTIVE: APPEARANCE:  Patient in no acute distress.The patient appeared well nourished and normally developed.  Acyanotic. Waist: VITAL SIGNS:BP 149/89  Pulse 77  Temp(Src) 97.6 F (36.4 C) (Oral)  Ht 5\' 11"  (1.803 m)  Wt 208 lb 6.4 oz (94.53 kg)  BMI 29.08 kg/m2 WM  Tanned.  SKIN: warm and  Dry without overt rashes, tattoos and scars  HEAD and Neck: without JVD, Head and scalp: normal Eyes:No scleral icterus. Fundi normal, eye movements normal. Ears: Auricle normal, canal normal, Tympanic membranes normal, insufflation normal.decreased hearing bilaterally right greater than the left Nose: normal Throat: normal Neck & thyroid: normal  CHEST & LUNGS: Chest wall: normal Lungs: Clear  CVS: Reveals the PMI to be normally located. Regular rhythm, First and Second Heart sounds are normal,  absence of murmurs, rubs or gallops. Peripheral vasculature: Radial pulses: normal Dorsal pedis pulses: normal Posterior pulses: normal  ABDOMEN:  Appearance: normal Benign, no organomegaly, no masses, no Abdominal Aortic enlargement. No Guarding , no rebound. No Bruits. Bowel sounds: normal  RECTAL: N/A GU: N/A  EXTREMETIES: nonedematous.  MUSCULOSKELETAL:  Spine: normal Joints: intact  NEUROLOGIC: oriented to time,place and person; nonfocal. Strength is normal Sensory is normal Reflexes are normal Cranial Nerves are normal.  ASSESSMENT: HYPERTENSION - Plan: amLODipine (NORVASC) 10 MG tablet  Tinnitus of both ears  Unspecified hypothyroidism  BP not at goal.  PLAN: Recommend that he undergo a CT of his brain instead of the MRI due to metal appliances in his neck.  Meds ordered this encounter  Medications  . amLODipine (NORVASC) 10 MG tablet    Sig: Take 1 tablet (10 mg total) by mouth daily.    Dispense:  30 tablet    Refill:  3    Patient advised to follow through with the ENT and pending that workup whether he needs other medical work up.  he does not have symptoms associated with a  phaeochromatosis or other hormone secreting tumors.  Return in about 4 weeks  (around 04/27/2013) for recheck BP, Recheck medical problems.   Goldye Tourangeau P. Modesto Charon, M.D.

## 2013-04-17 ENCOUNTER — Ambulatory Visit: Payer: BC Managed Care – PPO | Admitting: Family Medicine

## 2013-04-28 ENCOUNTER — Ambulatory Visit (INDEPENDENT_AMBULATORY_CARE_PROVIDER_SITE_OTHER): Payer: BC Managed Care – PPO | Admitting: Family Medicine

## 2013-04-28 ENCOUNTER — Encounter: Payer: Self-pay | Admitting: Family Medicine

## 2013-04-28 VITALS — BP 145/74 | HR 85 | Temp 97.5°F | Ht 70.0 in | Wt 209.2 lb

## 2013-04-28 DIAGNOSIS — H9313 Tinnitus, bilateral: Secondary | ICD-10-CM

## 2013-04-28 DIAGNOSIS — Z23 Encounter for immunization: Secondary | ICD-10-CM | POA: Insufficient documentation

## 2013-04-28 DIAGNOSIS — E785 Hyperlipidemia, unspecified: Secondary | ICD-10-CM

## 2013-04-28 DIAGNOSIS — H9319 Tinnitus, unspecified ear: Secondary | ICD-10-CM

## 2013-04-28 DIAGNOSIS — E039 Hypothyroidism, unspecified: Secondary | ICD-10-CM

## 2013-04-28 DIAGNOSIS — K219 Gastro-esophageal reflux disease without esophagitis: Secondary | ICD-10-CM

## 2013-04-28 DIAGNOSIS — E782 Mixed hyperlipidemia: Secondary | ICD-10-CM | POA: Insufficient documentation

## 2013-04-28 DIAGNOSIS — N411 Chronic prostatitis: Secondary | ICD-10-CM

## 2013-04-28 DIAGNOSIS — J984 Other disorders of lung: Secondary | ICD-10-CM

## 2013-04-28 DIAGNOSIS — I10 Essential (primary) hypertension: Secondary | ICD-10-CM

## 2013-04-28 NOTE — Progress Notes (Signed)
Patient ID: Jeffrey Hooper, male   DOB: 1960-08-13, 52 y.o.   MRN: [id number] SUBJECTIVE: CC: Chief Complaint  Patient presents with  . Follow-up    4 week follow up     HPI: Patient is here for follow up of hypertension/hypothyroidism: denies Headache;deniesChest Pain;denies weakness;denies Shortness of Breath or Orthopnea;denies Visual changes;denies palpitations;denies cough;denies pedal edema;denies symptoms of TIA or stroke; admits to Compliance with medications. denies Problems with medications. Past Medical History  Diagnosis Date  . Hypertension   . Pulmonary nodule   . Hypothyroid   . GERD (gastroesophageal reflux disease)   . Hiatal hernia   . Prostatism   . Seasonal allergies    Past Surgical History  Procedure Laterality Date  . Back surgery      T12 - L1  . Spinal fusion      C6-7  . Tonsillectomy    . Vasectomy    . Sinus signs    . Radial nerve    . Mass excision  06/25/2012    Procedure: EXCISION MASS;  Surgeon: Nicki Reaper, MD;  [facility];  Service: Orthopedics;  Laterality: Left;  transection of NEUROMA, BURYING RADIAL NERVE IN BRACHIORADIALIS LEFT SIDE   History   Social History  . Marital Status: Married    Spouse Name: N/A    Number of Children: 3  . 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 Smoking status: Former Smoker    Quit date: 11/28/2006  . Smokeless tobacco: Not on file  . Alcohol Use: 0.5 oz/week    1 drink(s) per week  . Drug Use: No  . Sexual Activity: Not on file   Other Topics Concern  . Not on file   Social History Narrative   Lives at home with wife, new 22 year old adopted boy.     Family History  Problem Relation Age of Onset  . Heart attack Father 33    Died with MI  . Heart failure Mother 66    Died age 3   Current Outpatient Prescriptions on File Prior to Visit  Medication Sig Dispense Refill  . amLODipine  (NORVASC) 10 MG tablet Take 1 tablet (10 mg total) by mouth daily.  30 tablet  3  . aspirin 81 MG tablet Take 81 mg by mouth daily.      [name] atorvastatin (LIPITOR) 40 MG tablet Take 1 tablet (40 mg total) by mouth daily.  90 tablet  3  . hydrochlorothiazide (HYDRODIURIL) 25 MG tablet Take 1 tablet (25 mg total) by mouth daily.  90 tablet  3  . levothyroxine (SYNTHROID, LEVOTHROID) 100 MCG tablet Take 1 tablet (100 mcg total) by mouth daily.  90 tablet  3  . mometasone (NASONEX) 50 MCG/ACT nasal spray Place 2 sprays into the nose daily.      [name] omeprazole (PRILOSEC) 40 MG capsule Take 1 capsule (40 mg total) by mouth daily.  90 capsule  3   No current facility-administered medications on file prior to visit.   Allergies  Allergen Reactions  . Prednisone     im depo medrol  Dizziness passed out  Ask pt before giving  . Doxycycline Hyclate     REACTION: whelps, itching  . Fentanyl   . Penicillins    Immunization History  Administered Date(s) Administered  . Influenza Whole 05/02/2012  . Influenza,inj,Quad PF,36+ Mos 04/28/2013  .  Td 04/12/2010   Prior to Admission medications   Medication Sig Start Date End Date Taking? Authorizing Provider  amLODipine (NORVASC) 10 MG tablet Take 1 tablet (10 mg total) by mouth daily. 03/30/13  Yes Ileana Ladd, MD  aspirin 81 MG tablet Take 81 mg by mouth daily.   Yes Historical Provider, MD  atorvastatin (LIPITOR) 40 MG tablet Take 1 tablet (40 mg total) by mouth daily. 03/06/13  Yes Ileana Ladd, MD  hydrochlorothiazide (HYDRODIURIL) 25 MG tablet Take 1 tablet (25 mg total) by mouth daily. 03/06/13  Yes Ileana Ladd, MD  levothyroxine (SYNTHROID, LEVOTHROID) 100 MCG tablet Take 1 tablet (100 mcg total) by mouth daily. 03/06/13  Yes Ileana Ladd, MD  mometasone (NASONEX) 50 MCG/ACT nasal spray Place 2 sprays into the nose daily.   Yes Historical Provider, MD  omeprazole (PRILOSEC) 40 MG capsule Take 1 capsule (40 mg total) by mouth daily. 03/06/13  Yes  Ileana Ladd, MD     ROS: As above in the HPI. All other systems are stable or negative.  OBJECTIVE: APPEARANCE:  Patient in no acute distress.The patient appeared well nourished and normally developed. Acyanotic. Waist: VITAL SIGNS:BP 145/74  Pulse 85  Temp(Src) 97.5 F (36.4 C) (Oral)  Ht 5\' 10"  (1.778 m)  Wt 209 lb 3.2 oz (94.892 kg)  BMI 30.02 kg/m2 WM BP 137/80  SKIN: warm and  Dry without overt rashes, tattoos and scars. Scaly lesion 1 mm left cheek  HEAD and Neck: without JVD, Head and scalp: normal Eyes:No scleral icterus. Fundi normal, eye movements normal. Ears: Auricle normal, canal normal, Tympanic membranes normal, insufflation normal. Nose: normal Throat: normal Neck & thyroid: normal  CHEST & LUNGS: Chest wall: normal Lungs: Clear  CVS: Reveals the PMI to be normally located. Regular rhythm, First and Second Heart sounds are normal,  absence of murmurs, rubs or gallops. Peripheral vasculature: Radial pulses: normal Dorsal pedis pulses: normal Posterior pulses: normal  ABDOMEN:  Appearance: normal Benign, no organomegaly, no masses, no Abdominal Aortic enlargement. No Guarding , no rebound. No Bruits. Bowel sounds: normal  RECTAL: N/A GU: N/A  EXTREMETIES: nonedematous.  MUSCULOSKELETAL:  Spine: normal Joints: intact  NEUROLOGIC: oriented to time,place and person; nonfocal. Strength is normal Sensory is normal Reflexes are normal Cranial Nerves are normal.   ASSESSMENT: HYPERTENSION - Plan: CMP14+EGFR  Need for prophylactic vaccination and inoculation against influenza  Unspecified hypothyroidism - Plan: TSH  GERD (gastroesophageal reflux disease)  Other and unspecified hyperlipidemia - Plan: CMP14+EGFR, NMR, lipoprofile  Prostatitis, chronic  Tinnitus of both ears  PULMONARY NODULE   PLAN:  Orders Placed This Encounter  Procedures  . CMP14+EGFR  . NMR, lipoprofile  . TSH    There are no discontinued  medications.  Diet exercise and weight loss emphasized. instead of adjusting medications. Patient will attempt to lose weight.  Same medications  Return in about 3 months (around 07/28/2013) for Pex.  Montario Zilka P. [place] Charon, M.D.

## 2013-04-30 LAB — CMP14+EGFR
ALT: 40 IU/L (ref 0–44)
AST: 30 IU/L (ref 0–40)
Albumin/Globulin Ratio: 2.8 — ABNORMAL HIGH (ref 1.1–2.5)
Albumin: 4.5 g/dL (ref 3.5–5.5)
Alkaline Phosphatase: 96 IU/L (ref 39–117)
BUN/Creatinine Ratio: 14 (ref 9–20)
BUN: 16 mg/dL (ref 6–24)
CO2: 30 mmol/L — ABNORMAL HIGH (ref 18–29)
Calcium: 9.7 mg/dL (ref 8.7–10.2)
Chloride: 98 mmol/L (ref 97–108)
Creatinine, Ser: 1.14 mg/dL (ref 0.76–1.27)
GFR calc Af Amer: 86 mL/min/{1.73_m2} (ref >59)
GFR calc non Af Amer: 74 mL/min/{1.73_m2} (ref >59)
Globulin, Total: 1.6 g/dL (ref 1.5–4.5)
Glucose: 117 mg/dL — ABNORMAL HIGH (ref 65–99)
Potassium: 3.8 mmol/L (ref 3.5–5.2)
Sodium: 140 mmol/L (ref 134–144)
Total Bilirubin: 0.6 mg/dL (ref 0.0–1.2)
Total Protein: 6.1 g/dL (ref 6.0–8.5)

## 2013-04-30 LAB — NMR, LIPOPROFILE
Cholesterol: 122 mg/dL (ref <200)
HDL Cholesterol by NMR: 44 mg/dL (ref >=40)
HDL Particle Number: 36.8 umol/L (ref >=30.5)
LDL Particle Number: 1051 nmol/L — ABNORMAL HIGH (ref <1000)
LDL Size: 19.9 nm — ABNORMAL LOW (ref >20.5)
LDLC SERPL CALC-MCNC: 50 mg/dL (ref <100)
LP-IR Score: 59 — ABNORMAL HIGH (ref <=45)
Small LDL Particle Number: 886 nmol/L — ABNORMAL HIGH (ref <=527)
Triglycerides by NMR: 139 mg/dL (ref <150)

## 2013-04-30 LAB — TSH: TSH: 2.71 u[IU]/mL (ref 0.450–4.500)

## 2013-05-01 ENCOUNTER — Ambulatory Visit: Payer: BC Managed Care – PPO | Admitting: Family Medicine

## 2013-07-14 ENCOUNTER — Other Ambulatory Visit: Payer: Self-pay | Admitting: Family Medicine

## 2013-08-07 ENCOUNTER — Ambulatory Visit (INDEPENDENT_AMBULATORY_CARE_PROVIDER_SITE_OTHER): Payer: BC Managed Care – PPO | Admitting: Family Medicine

## 2013-08-07 ENCOUNTER — Encounter: Payer: Self-pay | Admitting: Family Medicine

## 2013-08-07 VITALS — BP 122/78 | HR 72 | Temp 97.8°F | Ht 71.0 in | Wt 201.4 lb

## 2013-08-07 DIAGNOSIS — Z119 Encounter for screening for infectious and parasitic diseases, unspecified: Secondary | ICD-10-CM

## 2013-08-07 DIAGNOSIS — Z0000 Encounter for general adult medical examination without abnormal findings: Secondary | ICD-10-CM | POA: Insufficient documentation

## 2013-08-07 DIAGNOSIS — D229 Melanocytic nevi, unspecified: Secondary | ICD-10-CM | POA: Insufficient documentation

## 2013-08-07 DIAGNOSIS — Z125 Encounter for screening for malignant neoplasm of prostate: Secondary | ICD-10-CM

## 2013-08-07 DIAGNOSIS — H9313 Tinnitus, bilateral: Secondary | ICD-10-CM

## 2013-08-07 DIAGNOSIS — I10 Essential (primary) hypertension: Secondary | ICD-10-CM

## 2013-08-07 DIAGNOSIS — E039 Hypothyroidism, unspecified: Secondary | ICD-10-CM

## 2013-08-07 DIAGNOSIS — J984 Other disorders of lung: Secondary | ICD-10-CM

## 2013-08-07 DIAGNOSIS — K219 Gastro-esophageal reflux disease without esophagitis: Secondary | ICD-10-CM

## 2013-08-07 DIAGNOSIS — E785 Hyperlipidemia, unspecified: Secondary | ICD-10-CM

## 2013-08-07 DIAGNOSIS — N411 Chronic prostatitis: Secondary | ICD-10-CM

## 2013-08-07 MED ORDER — AMLODIPINE BESYLATE 10 MG PO TABS: ORAL_TABLET | ORAL | Status: DC

## 2013-08-07 NOTE — Patient Instructions (Addendum)
HEALTH MAINTENANCE Immunizations: Tetanus-Diphtheria Booster due:2021 Pertusis Booster [phone] Flu Shot Due: every Fall Pneumonia Vaccine: usually at 53 years of age unless there are certain risk situations. Herpes Zoster/Shingles Vaccine due: usually at 53 years of age HPV XOV:ANVB age 28 to 35 years in males and females.  Healthy Life Habits: Exercise Goal: 5-6 days/week; start gradually(ie 30 minutes/3days per week) Nutrition: Balanced healthy meals including Vegetables and Fruits. Consider  Reading the following books: 1) Eat to Live by Dr Diona Browner; 2) Prevent and Reverse Heart Disease by Dr Karl Luke.  Vitamins:okay to take a multivitamin Aspirin:81 mg Stop Tobacco Use:n/a Seat Belt Use:+++ recommended Sunscreen Use:+++ recommended  Recommended Screening Tests: Colon Cancer Screening:2018 next due Blood work: today Cholesterol Screening:    today        HIV:  n/a                  Hepatitis C(people born 1945-1965):today  Monthly Self Testicular Exam:++++  Eye Exam: every 1 to 2 years recommended Dental Health: at least every 6 months  Others:    Living Will/Healthcare Power of Attorney: should have this in order with your personal estate planning

## 2013-08-07 NOTE — Progress Notes (Signed)
Patient ID: Jeffrey Hooper, male   DOB: 1960/08/18, 53 y.o.   MRN: [id number] SUBJECTIVE: CC: Chief Complaint  Patient presents with  . Annual Exam    states had "virus last week"  in which he staes has resloved. states havaing "nerve  block"    HPI: Here for annual physical:  As above Virus resolved. Had diarrhea and vomiting. His son has a fever today at the daycare today. Adopted.  Patient is here for follow up of hypertension/HLD/: denies Headache;deniesChest Pain;denies weakness;denies Shortness of Breath or Orthopnea;denies Visual changes;denies palpitations;denies cough;denies pedal edema;denies symptoms of TIA or stroke; admits to Compliance with medications. denies Problems with medications.  Past Medical History  Diagnosis Date  . Hypertension   . Pulmonary nodule   . Hypothyroid   . GERD (gastroesophageal reflux disease)   . Hiatal hernia   . Prostatism   . Seasonal allergies   . Hyperlipidemia   . Multiple benign nevi    Past Surgical History  Procedure Laterality Date  . Back surgery      T12 - L1  . Spinal fusion      C6-7  . Tonsillectomy    . Vasectomy    . Sinus signs    . Radial nerve    . Mass excision  06/25/2012    Procedure: EXCISION MASS;  Surgeon: Wynonia Sours, MD;  Location: [facility];  Service: Orthopedics;  Laterality: Left;  transection of NEUROMA, BURYING RADIAL NERVE IN BRACHIORADIALIS LEFT SIDE   History   Social History  . Marital Status: Married    Spouse Name: N/A    Number of Children: 3  . 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 Smoking status: Former Smoker    Quit date: 11/28/2006  . Smokeless tobacco: Not on file  . Alcohol Use: 0.5 oz/week    1 drink(s) per week  . Drug Use: No  . Sexual Activity: Not on file   Other Topics Concern  . Not on file   Social History Narrative   Lives at home with wife, new 107 year old adopted  boy.     Family History  Problem Relation Age of Onset  . Heart attack Father 9    Died with MI  . Heart failure Mother 102    Died age 36   Current Outpatient Prescriptions on File Prior to Visit  Medication Sig Dispense Refill  . aspirin 81 MG tablet Take 81 mg by mouth daily.      [name] atorvastatin (LIPITOR) 40 MG tablet Take 1 tablet (40 mg total) by mouth daily.  90 tablet  3  . hydrochlorothiazide (HYDRODIURIL) 25 MG tablet Take 1 tablet (25 mg total) by mouth daily.  90 tablet  3  . levothyroxine (SYNTHROID, LEVOTHROID) 100 MCG tablet Take 1 tablet (100 mcg total) by mouth daily.  90 tablet  3  . mometasone (NASONEX) 50 MCG/ACT nasal spray Place 2 sprays into the nose daily.      [name] omeprazole (PRILOSEC) 40 MG capsule Take 1 capsule (40 mg total) by mouth daily.  90 capsule  3   No current facility-administered medications on file prior to visit.   Allergies  Allergen Reactions  . Prednisone     im depo medrol  Dizziness passed out  Ask pt before giving  . Doxycycline Hyclate     REACTION: whelps, itching  .  Fentanyl   . Penicillins    Immunization History  Administered Date(s) Administered  . Influenza Whole 05/02/2012  . Influenza,inj,Quad PF,36+ Mos 04/28/2013  . Td 04/12/2010   Prior to Admission medications   Medication Sig Start Date End Date Taking? Authorizing Provider  amLODipine (NORVASC) 10 MG tablet TAKE 1 TABLET (10 MG TOTAL) BY MOUTH DAILY. 07/14/13   Vernie Shanks, MD  aspirin 81 MG tablet Take 81 mg by mouth daily.    Historical Provider, MD  atorvastatin (LIPITOR) 40 MG tablet Take 1 tablet (40 mg total) by mouth daily. 03/06/13   Vernie Shanks, MD  hydrochlorothiazide (HYDRODIURIL) 25 MG tablet Take 1 tablet (25 mg total) by mouth daily. 03/06/13   Vernie Shanks, MD  levothyroxine (SYNTHROID, LEVOTHROID) 100 MCG tablet Take 1 tablet (100 mcg total) by mouth daily. 03/06/13   Vernie Shanks, MD  mometasone (NASONEX) 50 MCG/ACT nasal spray Place 2 sprays into  the nose daily.    Historical Provider, MD  omeprazole (PRILOSEC) 40 MG capsule Take 1 capsule (40 mg total) by mouth daily. 03/06/13   Vernie Shanks, MD     ROS: As above in the HPI. All other systems are stable or negative.  OBJECTIVE: APPEARANCE:  Patient in no acute distress.The patient appeared well nourished and normally developed. Acyanotic. Waist: VITAL SIGNS:BP 122/78  Pulse 72  Temp(Src) 97.8 F (36.6 C) (Oral)  Ht 5' 11"  (1.803 m)  Wt 201 lb 6.4 oz (91.354 kg)  BMI 28.10 kg/m2  WM  SKIN: warm and  Dry without overt rashes, tattoos. Thoracic spine scar  HEAD and Neck: without JVD, Head and scalp: normal Eyes:No scleral icterus. Fundi normal, eye movements normal. Ears: Auricle normal, canal normal, Tympanic membranes normal, insufflation normal. Nose: normal Throat: normal Neck & thyroid: normal  CHEST & LUNGS: Chest wall: normal Lungs: Clear  CVS: Reveals the PMI to be normally located. Regular rhythm, First and Second Heart sounds are normal,  absence of murmurs, rubs or gallops. Peripheral vasculature: Radial pulses: normal Dorsal pedis pulses: normal Posterior pulses: normal  ABDOMEN:  Appearance: normal Benign, no organomegaly, no masses, no Abdominal Aortic enlargement. No Guarding , no rebound. No Bruits. Bowel sounds: normal  RECTAL: mildly enlarged prostate. Heme negative brown stools GU: Normal. No masses and no hernias  EXTREMETIES: nonedematous.  MUSCULOSKELETAL:  Spine: thoracic spine scare Joints: intact  NEUROLOGIC: oriented to time,place and person; nonfocal. Strength is normal Sensory is normal Reflexes are normal Cranial Nerves are normal.   Results for orders placed in visit on 04/28/13  CMP14+EGFR      Result Value Range   Glucose 117 (*) 65 - 99 mg/dL   BUN 16  6 - 24 mg/dL   Creatinine, Ser 1.14  0.76 - 1.27 mg/dL   GFR calc non Af Amer 74  >59 mL/min/1.73   GFR calc Af Amer 86  >59 mL/min/1.73   BUN/Creatinine  Ratio 14  9 - 20   Sodium 140  134 - 144 mmol/L   Potassium 3.8  3.5 - 5.2 mmol/L   Chloride 98  97 - 108 mmol/L   CO2 30 (*) 18 - 29 mmol/L   Calcium 9.7  8.7 - 10.2 mg/dL   Total Protein 6.1  6.0 - 8.5 g/dL   Albumin 4.5  3.5 - 5.5 g/dL   Globulin, Total 1.6  1.5 - 4.5 g/dL   Albumin/Globulin Ratio 2.8 (*) 1.1 - 2.5   Total Bilirubin 0.6  0.0 -  1.2 mg/dL   Alkaline Phosphatase 96  39 - 117 IU/L   AST 30  0 - 40 IU/L   ALT 40  0 - 44 IU/L  NMR, LIPOPROFILE      Result Value Range   LDL Particle Number [id number] (*) <1000 nmol/L   LDLC SERPL CALC-MCNC 50  <100 mg/dL   HDL Cholesterol by NMR 44  >=40 mg/dL   Triglycerides by NMR 139  <150 mg/dL   Cholesterol 122  <200 mg/dL   HDL Particle Number 36.8  >=30.5 umol/L   Small LDL Particle Number 886 (*) <=527 nmol/L   LDL Size 19.9 (*) >20.5 nm   LP-IR Score 59 (*) <=45  TSH      Result Value Range   TSH 2.710  0.450 - 4.500 uIU/mL    ASSESSMENT: Annual physical exam  Hyperlipidemia - Plan: CMP14+EGFR, Lipid panel  HYPERTENSION - Plan: amLODipine (NORVASC) 10 MG tablet, CMP14+EGFR  GERD (gastroesophageal reflux disease)  PULMONARY NODULE  Tinnitus of both ears  Unspecified hypothyroidism - Plan: TSH  Prostatitis, chronic - Plan: PSA, total and free  Screening examination for infectious disease - Plan: Hepatitis C antibody  Screening for prostate cancer - Plan: PSA, total and free  Multiple benign nevi  PLAN:        HEALTH MAINTENANCE Immunizations: Tetanus-Diphtheria Booster due:2021 Pertusis Booster [id number] Flu Shot Due: every Fall Pneumonia Vaccine: usually at 53 years of age unless there are certain risk situations. Herpes Zoster/Shingles Vaccine due: usually at 53 years of age HPV IOX:BDZH age 60 to 69 years in males and females.  Healthy Life Habits: Exercise Goal: 5-6 days/week; start gradually(ie 30 minutes/3days per week) Nutrition: Balanced healthy meals including Vegetables and Fruits. Consider   Reading the following books: 1) Eat to Live by Dr Diona Browner; 2) Prevent and Reverse Heart Disease by Dr Karl Luke.  Vitamins:okay to take a multivitamin Aspirin:81 mg Stop Tobacco Use:n/a Seat Belt Use:+++ recommended Sunscreen Use:+++ recommended  Recommended Screening Tests: Colon Cancer Screening:2018 next due Blood work: today Cholesterol Screening:    today        HIV:  n/a                  Hepatitis C(people born 1945-1965):today  Monthly Self Testicular Exam:++++  Eye Exam: every 1 to 2 years recommended Dental Health: at least every 6 months  Others:    Living Will/Healthcare Power of Attorney: should have this in order with your personal estate planning    Orders Placed This Encounter  Procedures  . CMP14+EGFR  . Lipid panel  . TSH  . PSA, total and free  . Hepatitis C antibody   Meds ordered this encounter  Medications  . amLODipine (NORVASC) 10 MG tablet    Sig: TAKE 1 TABLET (10 MG TOTAL) BY MOUTH DAILY.    Dispense:  90 tablet    Refill:  3   Medications Discontinued During This Encounter  Medication Reason  . amLODipine (NORVASC) 10 MG tablet Reorder  wellness reviewed. Return in about 4 months (around 12/05/2013) for Recheck medical problems.  Kania Regnier P. Jacelyn Grip, M.D.

## 2013-08-08 LAB — CMP14+EGFR
ALT: 35 IU/L (ref 0–44)
AST: 27 IU/L (ref 0–40)
Albumin/Globulin Ratio: 2.7 — ABNORMAL HIGH (ref 1.1–2.5)
Albumin: 4.8 g/dL (ref 3.5–5.5)
Alkaline Phosphatase: 86 IU/L (ref 39–117)
BUN/Creatinine Ratio: 12 (ref 9–20)
BUN: 14 mg/dL (ref 6–24)
CO2: 27 mmol/L (ref 18–29)
Calcium: 9.8 mg/dL (ref 8.7–10.2)
Chloride: 97 mmol/L (ref 97–108)
Creatinine, Ser: 1.13 mg/dL (ref 0.76–1.27)
GFR calc Af Amer: 86 mL/min/{1.73_m2} (ref >59)
GFR calc non Af Amer: 74 mL/min/{1.73_m2} (ref >59)
Globulin, Total: 1.8 g/dL (ref 1.5–4.5)
Glucose: 100 mg/dL — ABNORMAL HIGH (ref 65–99)
Potassium: 3.6 mmol/L (ref 3.5–5.2)
Sodium: 143 mmol/L (ref 134–144)
Total Bilirubin: 0.6 mg/dL (ref 0.0–1.2)
Total Protein: 6.6 g/dL (ref 6.0–8.5)

## 2013-08-08 LAB — LIPID PANEL
Chol/HDL Ratio: 2.8 ratio units (ref 0.0–5.0)
Cholesterol, Total: 127 mg/dL (ref 100–199)
HDL: 45 mg/dL (ref >39)
LDL Calculated: 65 mg/dL (ref 0–99)
Triglycerides: 83 mg/dL (ref 0–149)
VLDL Cholesterol Cal: 17 mg/dL (ref 5–40)

## 2013-08-08 LAB — TSH: TSH: 3.77 u[IU]/mL (ref 0.450–4.500)

## 2013-08-08 LAB — PSA, TOTAL AND FREE
PSA, Free Pct: 40 %
PSA, Free: 0.4 ng/mL
PSA: 1 ng/mL (ref 0.0–4.0)

## 2013-08-08 LAB — HEPATITIS C ANTIBODY: Hep C Virus Ab: 0.2 s/co ratio (ref 0.0–0.9)

## 2013-09-10 ENCOUNTER — Telehealth: Payer: Self-pay | Admitting: Family Medicine

## 2013-09-11 NOTE — Telephone Encounter (Signed)
Appt given for Monday for follow up per patients request

## 2013-09-14 ENCOUNTER — Ambulatory Visit (INDEPENDENT_AMBULATORY_CARE_PROVIDER_SITE_OTHER): Payer: BC Managed Care – PPO | Admitting: Family Medicine

## 2013-09-14 ENCOUNTER — Encounter: Payer: Self-pay | Admitting: Family Medicine

## 2013-09-14 ENCOUNTER — Ambulatory Visit (INDEPENDENT_AMBULATORY_CARE_PROVIDER_SITE_OTHER): Payer: BC Managed Care – PPO

## 2013-09-14 VITALS — BP 129/77 | HR 68 | Temp 98.7°F | Ht 71.0 in | Wt 213.6 lb

## 2013-09-14 DIAGNOSIS — M5380 Other specified dorsopathies, site unspecified: Secondary | ICD-10-CM

## 2013-09-14 DIAGNOSIS — M549 Dorsalgia, unspecified: Secondary | ICD-10-CM

## 2013-09-14 DIAGNOSIS — M62830 Muscle spasm of back: Secondary | ICD-10-CM

## 2013-09-14 MED ORDER — MELOXICAM 15 MG PO TABS: 15.0000 mg | ORAL_TABLET | Freq: Every day | ORAL | Status: DC

## 2013-09-14 MED ORDER — CYCLOBENZAPRINE HCL 10 MG PO TABS: 10.0000 mg | ORAL_TABLET | Freq: Three times a day (TID) | ORAL | Status: DC | PRN

## 2013-09-14 NOTE — Patient Instructions (Signed)
Thoracic Strain You have injured the muscles or tendons that attach to the upper part of your back behind your chest. This injury is called a thoracic strain, thoracic sprain, or mid-back strain.  CAUSES  The cause of thoracic strain varies. A less severe injury involves pulling a muscle or tendon without tearing it. A more severe injury involves tearing (rupturing) a muscle or tendon. With less severe injuries, there may be little loss of strength. Sometimes, there are breaks (fractures) in the bones to which the muscles are attached. These fractures are rare, unless there was a direct hit (trauma) or you have weak bones due to osteoporosis or age. Longstanding strains may be caused by overuse or improper form during certain movements. Obesity can also increase your risk for back injuries. Sudden strains may occur due to injury or not warming up properly before exercise. Often, there is no obvious cause for a thoracic strain. SYMPTOMS  The main symptom is pain, especially with movement, such as during exercise. DIAGNOSIS  Your caregiver can usually tell what is wrong by taking an X-ray and doing a physical exam. TREATMENT   Physical therapy may be helpful for recovery. Your caregiver can give you exercises to do or refer you to a physical therapist after your pain improves.  After your pain improves, strengthening and conditioning programs appropriate for your sport or occupation may be helpful.  Always warm up before physical activities or athletics. Stretching after physical activity may also help.  Certain over-the-counter medicines may also help. Ask your caregiver if there are medicines that would help you. If this is your first thoracic strain injury, proper care and proper healing time before starting activities should prevent long-term problems. Torn ligaments and tendons require as long to heal as broken bones. Average healing times may be only 1 week for a mild strain. For torn muscles  and tendons, healing time may be up to 6 weeks to 2 months. HOME CARE INSTRUCTIONS   Apply ice to the injured area. Ice massages may also be used as directed.  Put ice in a plastic bag.  Place a towel between your skin and the bag.  Leave the ice on for 15-20 minutes, 03-04 times a day, for the first 2 days.  Only take over-the-counter or prescription medicines for pain, discomfort, or fever as directed by your caregiver.  Keep your appointments for physical therapy if this was prescribed.  Use wraps and back braces as instructed. SEEK IMMEDIATE MEDICAL CARE IF:   You have an increase in bruising, swelling, or pain.  Your pain has not improved with medicines.  You develop new shortness of breath, chest pain, or fever.  Problems seem to be getting worse rather than better. MAKE SURE YOU:   Understand these instructions.  Will watch your condition.  Will get help right away if you are not doing well or get worse. Document Released: 10/13/2003 Document Revised: 10/15/2011 Document Reviewed: 09/08/2010 [facility] Patient Information 2014 [place].   Lumbosacral Strain Lumbosacral strain is a strain of any of the parts that make up your lumbosacral vertebrae. Your lumbosacral vertebrae are the bones that make up the lower third of your backbone. Your lumbosacral vertebrae are held together by muscles and tough, fibrous tissue (ligaments).  CAUSES  A sudden blow to your back can cause lumbosacral strain. Also, anything that causes an excessive stretch of the muscles in the low back can cause this strain. This is typically seen when people exert themselves strenuously, fall,  lift heavy objects, bend, or crouch repeatedly. RISK FACTORS  Physically demanding work.  Participation in pushing or pulling sports or sports that require sudden twist of the back (tennis, golf, baseball).  Weight lifting.  Excessive lower back curvature.  Forward-tilted pelvis.  Weak back or  abdominal muscles or both.  Tight hamstrings. SIGNS AND SYMPTOMS  Lumbosacral strain may cause pain in the area of your injury or pain that moves (radiates) down your leg.  DIAGNOSIS Your health care provider can often diagnose lumbosacral strain through a physical exam. In some cases, you may need tests such as X-ray exams.  TREATMENT  Treatment for your lower back injury depends on many factors that your clinician will have to evaluate. However, most treatment will include the use of anti-inflammatory medicines. HOME CARE INSTRUCTIONS   Avoid hard physical activities (tennis, racquetball, waterskiing) if you are not in proper physical condition for it. This may aggravate or create problems.  If you have a back problem, avoid sports requiring sudden body movements. Swimming and walking are generally safer activities.  Maintain good posture.  Maintain a healthy weight.  For acute conditions, you may put ice on the injured area.  Put ice in a plastic bag.  Place a towel between your skin and the bag.  Leave the ice on for 20 minutes, 2 3 times a day.  When the low back starts healing, stretching and strengthening exercises may be recommended. SEEK MEDICAL CARE IF:  Your back pain is getting worse.  You experience severe back pain not relieved with medicines. SEEK IMMEDIATE MEDICAL CARE IF:   You have numbness, tingling, weakness, or problems with the use of your arms or legs.  There is a change in bowel or bladder control.  You have increasing pain in any area of the body, including your belly (abdomen).  You notice shortness of breath, dizziness, or feel faint.  You feel sick to your stomach (nauseous), are throwing up (vomiting), or become sweaty.  You notice discoloration of your toes or legs, or your feet get very cold. MAKE SURE YOU:   Understand these instructions.  Will watch your condition.  Will get help right away if you are not doing well or get  worse. Document Released: 05/02/2005 Document Revised: 05/13/2013 Document Reviewed: 03/11/2013 [facility] Patient Information 2014 [address], [place].   Back Exercises Back exercises help treat and prevent back injuries. The goal of back exercises is to increase the strength of your abdominal and back muscles and the flexibility of your back. These exercises should be started when you no longer have back pain. Back exercises include: Pelvic Tilt. Lie on your back with your knees bent. Tilt your pelvis until the lower part of your back is against the floor. Hold this position 5 to 10 sec and repeat 5 to 10 times. Knee to Chest. Pull first 1 knee up against your chest and hold for 20 to 30 seconds, repeat this with the other knee, and then both knees. This may be done with the other leg straight or bent, whichever feels better. Sit-Ups or Curl-Ups. Bend your knees 90 degrees. Start with tilting your pelvis, and do a partial, slow sit-up, lifting your trunk only 30 to 45 degrees off the floor. Take at least 2 to 3 seconds for each sit-up. Do not do sit-ups with your knees out straight. If partial sit-ups are difficult, simply do the above but with only tightening your abdominal muscles and holding it as directed. Hip-Lift. Lie on  your back with your knees flexed 90 degrees. Push down with your feet and shoulders as you raise your hips a couple inches off the floor; hold for 10 seconds, repeat 5 to 10 times. Back arches. Lie on your stomach, propping yourself up on bent elbows. Slowly press on your hands, causing an arch in your low back. Repeat 3 to 5 times. Any initial stiffness and discomfort should lessen with repetition over time. Shoulder-Lifts. Lie face down with arms beside your body. Keep hips and torso pressed to floor as you slowly lift your head and shoulders off the floor. Do not overdo your exercises, especially in the beginning. Exercises may cause you some mild back discomfort which lasts for a  few minutes; however, if the pain is more severe, or lasts for more than 15 minutes, do not continue exercises until you see your caregiver. Improvement with exercise therapy for back problems is slow.  See your caregivers for assistance with developing a proper back exercise program. Document Released: 08/30/2004 Document Revised: 10/15/2011 Document Reviewed: 05/24/2011 [facility] Patient Information 2014 [place].

## 2013-09-14 NOTE — Progress Notes (Signed)
Patient ID: Jeffrey Hooper, male   DOB: [birth year], 53 y.o.   MRN: [id number] SUBJECTIVE: CC: Chief Complaint  Patient presents with  . Follow-up    was seen at urgent care Tuesday fcr something "popped in back" was told having spasms    HPI: Here for recheck of his acute back pain. Was bending but he squats and he felt something give. Severe pain and he was so nauseated. No weakness no numbness. Felt and continues to feel like an area of swelling in the back. Feels like pressure in his back.  Past Medical History  Diagnosis Date  . Hypertension   . Pulmonary nodule   . Hypothyroid   . GERD (gastroesophageal reflux disease)   . Hiatal hernia   . Prostatism   . Seasonal allergies   . Hyperlipidemia   . Multiple benign nevi    Past Surgical History  Procedure Laterality Date  . Back surgery      T12 - L1  . Spinal fusion      C6-7  . Tonsillectomy    . Vasectomy    . Sinus signs    . Radial nerve    . Mass excision  06/25/2012    Procedure: EXCISION MASS;  Surgeon: Wynonia Sours, MD;  Location: [place];  Service: Orthopedics;  Laterality: Left;  transection of NEUROMA, BURYING RADIAL NERVE IN BRACHIORADIALIS LEFT SIDE   History   Social History  . Marital Status: Married    Spouse Name: N/A    Number of Children: 3  . Years of Education: N/A   Occupational History  . [employer] [place] History Main Topics  . Smoking status: Former Smoker    Quit date: 11/28/2006  . Smokeless tobacco: Not on file  . Alcohol Use: 0.5 oz/week    1 drink(s) per week  . Drug Use: No  . Sexual Activity: Not on file   Other Topics Concern  . Not on file   Social History Narrative   Lives at home with wife, new 64 year old adopted boy.     Family History  Problem Relation Age of Onset  . Heart attack Father 3    Died with MI  . Heart failure Mother 28    Died age 87   Current Outpatient Prescriptions on File Prior to Visit   Medication Sig Dispense Refill  . amLODipine (NORVASC) 10 MG tablet TAKE 1 TABLET (10 MG TOTAL) BY MOUTH DAILY.  90 tablet  3  . aspirin 81 MG tablet Take 81 mg by mouth daily.      [name] atorvastatin (LIPITOR) 40 MG tablet Take 1 tablet (40 mg total) by mouth daily.  90 tablet  3  . hydrochlorothiazide (HYDRODIURIL) 25 MG tablet Take 1 tablet (25 mg total) by mouth daily.  90 tablet  3  . levothyroxine (SYNTHROID, LEVOTHROID) 100 MCG tablet Take 1 tablet (100 mcg total) by mouth daily.  90 tablet  3  . mometasone (NASONEX) 50 MCG/ACT nasal spray Place 2 sprays into the nose daily.      [name] omeprazole (PRILOSEC) 40 MG capsule Take 1 capsule (40 mg total) by mouth daily.  90 capsule  3   No current facility-administered medications on file prior to visit.   Allergies  Allergen Reactions  . Prednisone     im depo medrol  Dizziness passed out  Ask pt before giving  . Doxycycline Hyclate  REACTION: whelps, itching  . Fentanyl   . Penicillins    Immunization History  Administered Date(s) Administered  . Influenza Whole 05/02/2012  . Influenza,inj,Quad PF,36+ Mos 04/28/2013  . Td 04/12/2010   Prior to Admission medications   Medication Sig Start Date End Date Taking? Authorizing Provider  amLODipine (NORVASC) 10 MG tablet TAKE 1 TABLET (10 MG TOTAL) BY MOUTH DAILY. 08/07/13  Yes Vernie Shanks, MD  aspirin 81 MG tablet Take 81 mg by mouth daily.   Yes Historical Provider, MD  atorvastatin (LIPITOR) 40 MG tablet Take 1 tablet (40 mg total) by mouth daily. 03/06/13  Yes Vernie Shanks, MD  diazepam (VALIUM) 5 MG tablet Take 5 mg by mouth every 8 (eight) hours as needed for anxiety.   Yes Historical Provider, MD  hydrochlorothiazide (HYDRODIURIL) 25 MG tablet Take 1 tablet (25 mg total) by mouth daily. 03/06/13  Yes Vernie Shanks, MD  HYDROcodone-acetaminophen (NORCO/VICODIN) 5-325 MG per tablet Take 1 tablet by mouth every 4 (four) hours as needed for moderate pain.   Yes Historical Provider, MD   levothyroxine (SYNTHROID, LEVOTHROID) 100 MCG tablet Take 1 tablet (100 mcg total) by mouth daily. 03/06/13  Yes Vernie Shanks, MD  mometasone (NASONEX) 50 MCG/ACT nasal spray Place 2 sprays into the nose daily.   Yes Historical Provider, MD  omeprazole (PRILOSEC) 40 MG capsule Take 1 capsule (40 mg total) by mouth daily. 03/06/13  Yes Vernie Shanks, MD     ROS: As above in the HPI. All other systems are stable or negative.  OBJECTIVE: APPEARANCE:  Patient in no acute distress.The patient appeared well nourished and normally developed. Acyanotic. Waist: VITAL SIGNS:BP 129/77  Pulse 68  Temp(Src) 98.7 F (37.1 C) (Oral)  Ht 5\' 11"  (1.803 m)  Wt 213 lb 9.6 oz (96.888 kg)  BMI 29.80 kg/m2  WM uncomfortable. Holding back ridged upright  SKIN: warm and  Dry without overt rashes, tattoos and scars  HEAD and Neck: without JVD, Head and scalp: normal Eyes:No scleral icterus. Fundi normal, eye movements normal. Ears: Auricle normal, canal normal, Tympanic membranes normal, insufflation normal. Nose: normal Throat: normal Neck & thyroid: normal  CHEST & LUNGS: Chest wall: normal Lungs: Clear  CVS: Reveals the PMI to be normally located. Regular rhythm, First and Second Heart sounds are normal,  absence of murmurs, rubs or gallops. Peripheral vasculature: Radial pulses: normal Dorsal pedis pulses: normal Posterior pulses: normal  ABDOMEN:  Appearance: normal Benign, no organomegaly, no masses, no Abdominal Aortic enlargement. No Guarding , no rebound. No Bruits. Bowel sounds: normal  RECTAL: N/A GU: N/A  EXTREMETIES: nonedematous.  MUSCULOSKELETAL:  Spine: thoraco-lumbar scar.from surgery. Left lumbar and lower thoracic muscle  Spasm. Reduced ROM of spine.  Ambulates slowly and with his back stiff and upright. Joints: intact  NEUROLOGIC: oriented to time,place and person; nonfocal. Able to squat carefully but uncomfortable. Strength is normal Sensory is  normal Reflexes are normal Cranial Nerves are normal.  ASSESSMENT: Acute back pain - Plan: Ambulatory referral to Physical Therapy, DG Lumbar Spine Complete, cyclobenzaprine (FLEXERIL) 10 MG tablet, meloxicam (MOBIC) 15 MG tablet  Muscle spasm of back - Plan: Ambulatory referral to Physical Therapy, DG Lumbar Spine Complete, cyclobenzaprine (FLEXERIL) 10 MG tablet, meloxicam (MOBIC) 15 MG tablet   He is almost out of valium and the narcotic pain medication. Will switch to non benzodiazepine muscle relaxANT  and NSAID   PLAN:  Refer to PT Patient wanted to give work a try.  Orders Placed This Encounter  Procedures  . DG Lumbar Spine Complete    Standing Status: Future     Number of Occurrences: 1     Standing Expiration Date: 11/13/2014    Order Specific Question:  Reason for Exam (SYMPTOM  OR DIAGNOSIS REQUIRED)    Answer:  acute back pain    Order Specific Question:  Preferred imaging location?    Answer:  Internal  . Ambulatory referral to Physical Therapy    Referral Priority:  Routine    Referral Type:  Physical Medicine    Referral Reason:  Specialty Services Required    Requested Specialty:  Physical Therapy    Number of Visits Requested:  1  [facility] reading (PRIMARY) by  Dr. Jacelyn Grip: T12 L1 previous  Surgery. 2 mm retrolisthesis at L3. Mild  Degenerative changes. No acute findings otherwise.                               Meds ordered this encounter  Medications  . diazepam (VALIUM) 5 MG tablet    Sig: Take 5 mg by mouth every 8 (eight) hours as needed for anxiety.  [name] HYDROcodone-acetaminophen (NORCO/VICODIN) 5-325 MG per tablet    Sig: Take 1 tablet by mouth every 4 (four) hours as needed for moderate pain.  . cyclobenzaprine (FLEXERIL) 10 MG tablet    Sig: Take 1 tablet (10 mg total) by mouth 3 (three) times daily as needed for muscle spasms.    Dispense:  30 tablet    Refill:  0  . meloxicam (MOBIC) 15 MG tablet    Sig: Take 1 tablet (15 mg total) by mouth  daily.    Dispense:  30 tablet    Refill:  0  patient instructed to start the flkexeril and meloxicam after he completes the valium and the hydrocodone.  I think he will benefit from PT.   Handouts on back care and back strain in the AVS given.  There are no discontinued medications. Return in about 3 weeks (around 10/05/2013) for recheck back pain.  Keliyah Spillman P. Jacelyn Grip, M.D.

## 2013-09-22 ENCOUNTER — Ambulatory Visit: Payer: Self-pay | Admitting: Physical Therapy

## 2013-09-28 ENCOUNTER — Ambulatory Visit: Payer: Self-pay | Admitting: Physical Therapy

## 2013-10-08 ENCOUNTER — Encounter: Payer: Self-pay | Admitting: Family Medicine

## 2013-10-08 ENCOUNTER — Ambulatory Visit (INDEPENDENT_AMBULATORY_CARE_PROVIDER_SITE_OTHER): Payer: BC Managed Care – PPO | Admitting: Family Medicine

## 2013-10-08 VITALS — BP 121/72 | HR 66 | Temp 97.7°F | Ht 71.0 in | Wt 228.0 lb

## 2013-10-08 DIAGNOSIS — M62830 Muscle spasm of back: Secondary | ICD-10-CM

## 2013-10-08 DIAGNOSIS — M549 Dorsalgia, unspecified: Secondary | ICD-10-CM

## 2013-10-08 DIAGNOSIS — M5380 Other specified dorsopathies, site unspecified: Secondary | ICD-10-CM

## 2013-10-08 MED ORDER — CYCLOBENZAPRINE HCL 10 MG PO TABS: 10.0000 mg | ORAL_TABLET | Freq: Three times a day (TID) | ORAL | Status: DC | PRN

## 2013-10-08 MED ORDER — MELOXICAM 15 MG PO TABS: 15.0000 mg | ORAL_TABLET | Freq: Every day | ORAL | Status: DC
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
## 2013-10-08 NOTE — Patient Instructions (Signed)
Back Exercises Back exercises help treat and prevent back injuries. The goal of back exercises is to increase the strength of your abdominal and back muscles and the flexibility of your back. These exercises should be started when you no longer have back pain. Back exercises include:  Pelvic Tilt. Lie on your back with your knees bent. Tilt your pelvis until the lower part of your back is against the floor. Hold this position 5 to 10 sec and repeat 5 to 10 times.  Knee to Chest. Pull first 1 knee up against your chest and hold for 20 to 30 seconds, repeat this with the other knee, and then both knees. This may be done with the other leg straight or bent, whichever feels better.  Sit-Ups or Curl-Ups. Bend your knees 90 degrees. Start with tilting your pelvis, and do a partial, slow sit-up, lifting your trunk only 30 to 45 degrees off the floor. Take at least 2 to 3 seconds for each sit-up. Do not do sit-ups with your knees out straight. If partial sit-ups are difficult, simply do the above but with only tightening your abdominal muscles and holding it as directed.  Hip-Lift. Lie on your back with your knees flexed 90 degrees. Push down with your feet and shoulders as you raise your hips a couple inches off the floor; hold for 10 seconds, repeat 5 to 10 times.  Back arches. Lie on your stomach, propping yourself up on bent elbows. Slowly press on your hands, causing an arch in your low back. Repeat 3 to 5 times. Any initial stiffness and discomfort should lessen with repetition over time.  Shoulder-Lifts. Lie face down with arms beside your body. Keep hips and torso pressed to floor as you slowly lift your head and shoulders off the floor. Do not overdo your exercises, especially in the beginning. Exercises may cause you some mild back discomfort which lasts for a few minutes; however, if the pain is more severe, or lasts for more than 15 minutes, do not continue exercises until you see your caregiver.  Improvement with exercise therapy for back problems is slow.  See your caregivers for assistance with developing a proper back exercise program. Document Released: 08/30/2004 Document Revised: 10/15/2011 Document Reviewed: 05/24/2011 [facility] Patient Information 2014 [place].    Lumbosacral Strain Lumbosacral strain is a strain of any of the parts that make up your lumbosacral vertebrae. Your lumbosacral vertebrae are the bones that make up the lower third of your backbone. Your lumbosacral vertebrae are held together by muscles and tough, fibrous tissue (ligaments).  CAUSES  A sudden blow to your back can cause lumbosacral strain. Also, anything that causes an excessive stretch of the muscles in the low back can cause this strain. This is typically seen when people exert themselves strenuously, fall, lift heavy objects, bend, or crouch repeatedly. RISK FACTORS  Physically demanding work.  Participation in pushing or pulling sports or sports that require sudden twist of the back (tennis, golf, baseball).  Weight lifting.  Excessive lower back curvature.  Forward-tilted pelvis.  Weak back or abdominal muscles or both.  Tight hamstrings. SIGNS AND SYMPTOMS  Lumbosacral strain may cause pain in the area of your injury or pain that moves (radiates) down your leg.  DIAGNOSIS Your health care provider can often diagnose lumbosacral strain through a physical exam. In some cases, you may need tests such as X-ray exams.  TREATMENT  Treatment for your lower back injury depends on many factors that your clinician  will have to evaluate. However, most treatment will include the use of anti-inflammatory medicines. HOME CARE INSTRUCTIONS   Avoid hard physical activities (tennis, racquetball, waterskiing) if you are not in proper physical condition for it. This may aggravate or create problems.  If you have a back problem, avoid sports requiring sudden body movements. Swimming and walking  are generally safer activities.  Maintain good posture.  Maintain a healthy weight.  For acute conditions, you may put ice on the injured area.  Put ice in a plastic bag.  Place a towel between your skin and the bag.  Leave the ice on for 20 minutes, 2 3 times a day.  When the low back starts healing, stretching and strengthening exercises may be recommended. SEEK MEDICAL CARE IF:  Your back pain is getting worse.  You experience severe back pain not relieved with medicines. SEEK IMMEDIATE MEDICAL CARE IF:   You have numbness, tingling, weakness, or problems with the use of your arms or legs.  There is a change in bowel or bladder control.  You have increasing pain in any area of the body, including your belly (abdomen).  You notice shortness of breath, dizziness, or feel faint.  You feel sick to your stomach (nauseous), are throwing up (vomiting), or become sweaty.  You notice discoloration of your toes or legs, or your feet get very cold. MAKE SURE YOU:   Understand these instructions.  Will watch your condition.  Will get help right away if you are not doing well or get worse. Document Released: 05/02/2005 Document Revised: 05/13/2013 Document Reviewed: 03/11/2013 [facility] Patient Information 2014 [place], [place].

## 2013-11-13 ENCOUNTER — Encounter: Payer: Self-pay | Admitting: Family Medicine

## 2013-11-13 ENCOUNTER — Ambulatory Visit (INDEPENDENT_AMBULATORY_CARE_PROVIDER_SITE_OTHER): Payer: BC Managed Care – PPO | Admitting: Family Medicine

## 2013-11-13 VITALS — BP 130/70 | HR 74 | Temp 98.5°F | Ht 71.0 in | Wt 206.6 lb

## 2013-11-13 DIAGNOSIS — J309 Allergic rhinitis, unspecified: Secondary | ICD-10-CM

## 2013-11-13 DIAGNOSIS — K219 Gastro-esophageal reflux disease without esophagitis: Secondary | ICD-10-CM

## 2013-11-13 DIAGNOSIS — J3089 Other allergic rhinitis: Secondary | ICD-10-CM | POA: Insufficient documentation

## 2013-11-13 DIAGNOSIS — J302 Other seasonal allergic rhinitis: Secondary | ICD-10-CM

## 2013-11-13 DIAGNOSIS — I10 Essential (primary) hypertension: Secondary | ICD-10-CM

## 2013-11-13 DIAGNOSIS — E785 Hyperlipidemia, unspecified: Secondary | ICD-10-CM

## 2013-11-13 DIAGNOSIS — S335XXA Sprain of ligaments of lumbar spine, initial encounter: Secondary | ICD-10-CM

## 2013-11-13 DIAGNOSIS — S39012A Strain of muscle, fascia and tendon of lower back, initial encounter: Secondary | ICD-10-CM | POA: Insufficient documentation

## 2013-11-13 DIAGNOSIS — E039 Hypothyroidism, unspecified: Secondary | ICD-10-CM

## 2013-11-13 MED ORDER — MOMETASONE FUROATE 50 MCG/ACT NA SUSP: 2.0000 | Freq: Every day | NASAL | Status: DC

## 2013-11-13 NOTE — Patient Instructions (Addendum)
Lumbosacral Strain Lumbosacral strain is a strain of any of the parts that make up your lumbosacral vertebrae. Your lumbosacral vertebrae are the bones that make up the lower third of your backbone. Your lumbosacral vertebrae are held together by muscles and tough, fibrous tissue (ligaments).  CAUSES  A sudden blow to your back can cause lumbosacral strain. Also, anything that causes an excessive stretch of the muscles in the low back can cause this strain. This is typically seen when people exert themselves strenuously, fall, lift heavy objects, bend, or crouch repeatedly. RISK FACTORS  Physically demanding work.  Participation in pushing or pulling sports or sports that require sudden twist of the back (tennis, golf, baseball).  Weight lifting.  Excessive lower back curvature.  Forward-tilted pelvis.  Weak back or abdominal muscles or both.  Tight hamstrings. SIGNS AND SYMPTOMS  Lumbosacral strain may cause pain in the area of your injury or pain that moves (radiates) down your leg.  DIAGNOSIS Your health care provider can often diagnose lumbosacral strain through a physical exam. In some cases, you may need tests such as X-ray exams.  TREATMENT  Treatment for your lower back injury depends on many factors that your clinician will have to evaluate. However, most treatment will include the use of anti-inflammatory medicines. HOME CARE INSTRUCTIONS   Avoid hard physical activities (tennis, racquetball, waterskiing) if you are not in proper physical condition for it. This may aggravate or create problems.  If you have a back problem, avoid sports requiring sudden body movements. Swimming and walking are generally safer activities.  Maintain good posture.  Maintain a healthy weight.  For acute conditions, you may put ice on the injured area.  Put ice in a plastic bag.  Place a towel between your skin and the bag.  Leave the ice on for 20 minutes, 2 3 times a day.  When the  low back starts healing, stretching and strengthening exercises may be recommended. SEEK MEDICAL CARE IF:  Your back pain is getting worse.  You experience severe back pain not relieved with medicines. SEEK IMMEDIATE MEDICAL CARE IF:   You have numbness, tingling, weakness, or problems with the use of your arms or legs.  There is a change in bowel or bladder control.  You have increasing pain in any area of the body, including your belly (abdomen).  You notice shortness of breath, dizziness, or feel faint.  You feel sick to your stomach (nauseous), are throwing up (vomiting), or become sweaty.  You notice discoloration of your toes or legs, or your feet get very cold. MAKE SURE YOU:   Understand these instructions.  Will watch your condition.  Will get help right away if you are not doing well or get worse. Document Released: 05/02/2005 Document Revised: 05/13/2013 Document Reviewed: 03/11/2013 [facility] Patient Information 2014 [place], [place].   Back Exercises Back exercises help treat and prevent back injuries. The goal of back exercises is to increase the strength of your abdominal and back muscles and the flexibility of your back. These exercises should be started when you no longer have back pain. Back exercises include:  Pelvic Tilt. Lie on your back with your knees bent. Tilt your pelvis until the lower part of your back is against the floor. Hold this position 5 to 10 sec and repeat 5 to 10 times.  Knee to Chest. Pull first 1 knee up against your chest and hold for 20 to 30 seconds, repeat this with the other knee, and then both  knees. This may be done with the other leg straight or bent, whichever feels better.  Sit-Ups or Curl-Ups. Bend your knees 90 degrees. Start with tilting your pelvis, and do a partial, slow sit-up, lifting your trunk only 30 to 45 degrees off the floor. Take at least 2 to 3 seconds for each sit-up. Do not do sit-ups with your knees out straight.  If partial sit-ups are difficult, simply do the above but with only tightening your abdominal muscles and holding it as directed.  Hip-Lift. Lie on your back with your knees flexed 90 degrees. Push down with your feet and shoulders as you raise your hips a couple inches off the floor; hold for 10 seconds, repeat 5 to 10 times.  Back arches. Lie on your stomach, propping yourself up on bent elbows. Slowly press on your hands, causing an arch in your low back. Repeat 3 to 5 times. Any initial stiffness and discomfort should lessen with repetition over time.  Shoulder-Lifts. Lie face down with arms beside your body. Keep hips and torso pressed to floor as you slowly lift your head and shoulders off the floor. Do not overdo your exercises, especially in the beginning. Exercises may cause you some mild back discomfort which lasts for a few minutes; however, if the pain is more severe, or lasts for more than 15 minutes, do not continue exercises until you see your caregiver. Improvement with exercise therapy for back problems is slow.  See your caregivers for assistance with developing a proper back exercise program. Document Released: 08/30/2004 Document Revised: 10/15/2011 Document Reviewed: 05/24/2011 [facility] Patient Information 2014 [place].    DASH Diet The DASH diet stands for "Dietary Approaches to Stop Hypertension." It is a healthy eating plan that has been shown to reduce high blood pressure (hypertension) in as little as 14 days, while also possibly providing other significant health benefits. These other health benefits include reducing the risk of breast cancer after menopause and reducing the risk of type 2 diabetes, heart disease, colon cancer, and stroke. Health benefits also include weight loss and slowing kidney failure in patients with chronic kidney disease.  DIET GUIDELINES  Limit salt (sodium). Your diet should contain less than 1500 mg of sodium daily.  Limit refined or  processed carbohydrates. Your diet should include mostly whole grains. Desserts and added sugars should be used sparingly.  Include small amounts of heart-healthy fats. These types of fats include nuts, oils, and tub margarine. Limit saturated and trans fats. These fats have been shown to be harmful in the body. CHOOSING FOODS  The following food groups are based on a 2000 calorie diet. See your Registered Dietitian for individual calorie needs. Grains and Grain Products (6 to 8 servings daily)  Eat More Often: Whole-wheat bread, brown rice, whole-grain or wheat pasta, quinoa, popcorn without added fat or salt (air popped).  Eat Less Often: White bread, white pasta, white rice, cornbread. Vegetables (4 to 5 servings daily)  Eat More Often: Fresh, frozen, and canned vegetables. Vegetables may be raw, steamed, roasted, or grilled with a minimal amount of fat.  Eat Less Often/Avoid: Creamed or fried vegetables. Vegetables in a cheese sauce. Fruit (4 to 5 servings daily)  Eat More Often: All fresh, canned (in natural juice), or frozen fruits. Dried fruits without added sugar. One hundred percent fruit juice ( cup [237 mL] daily).  Eat Less Often: Dried fruits with added sugar. Canned fruit in light or heavy syrup. [employer], Fish, and Poultry (2  servings or less daily. One serving is 3 to 4 oz [85-114 g]).  Eat More Often: Ninety percent or leaner ground beef, tenderloin, sirloin. Round cuts of beef, chicken breast, [place] breast. All fish. Grill, bake, or broil your meat. Nothing should be fried.  Eat Less Often/Avoid: Fatty cuts of meat, [place], or chicken leg, thigh, or wing. Fried cuts of meat or fish. Dairy (2 to 3 servings)  Eat More Often: Low-fat or fat-free milk, low-fat plain or light yogurt, reduced-fat or part-skim cheese.  Eat Less Often/Avoid: Milk (whole, 2%).Whole milk yogurt. Full-fat cheeses. Nuts, Seeds, and Legumes (4 to 5 servings per week)  Eat More Often: All  without added salt.  Eat Less Often/Avoid: Salted nuts and seeds, canned beans with added salt. Fats and Sweets (limited)  Eat More Often: Vegetable oils, tub margarines without trans fats, sugar-free gelatin. Mayonnaise and salad dressings.  Eat Less Often/Avoid: Coconut oils, palm oils, butter, stick margarine, cream, half and half, cookies, candy, pie. FOR MORE INFORMATION The Dash Diet Eating Plan: www.dashdiet.org Document Released: 07/12/2011 Document Revised: 10/15/2011 Document Reviewed: 07/12/2011 [facility] Patient Information 2014 [place], [place].

## 2013-11-13 NOTE — Progress Notes (Signed)
Patient ID: Jeffrey Hooper, male   DOB: 27-May-1961, 53 y.o.   MRN: [id number] SUBJECTIVE: CC: Chief Complaint  Patient presents with  . Follow-up    1 month foolow up  back pain  c/o sinus problems     HPI: Patient is here for follow up of hyperlipidemia/HTN: denies Headache;denies Chest Pain;denies weakness;denies Shortness of Breath and orthopnea;denies Visual changes;denies palpitations;denies cough;denies pedal edema;denies symptoms of TIA or stroke;deniesClaudication symptoms. admits to Compliance with medications; denies Problems with medications.  Back Pain./strain: his back is doing well now. But he has to be careful otherwise it will flare up on him.   SAR: allergy flare up with nasal congestion and runny nose and eyes. No fever. No headache. Doesn't have any Rx for nasonex. Will need a Rx for it.   Past Medical History  Diagnosis Date  . Hypertension   . Pulmonary nodule   . Hypothyroid   . GERD (gastroesophageal reflux disease)   . Hiatal hernia   . Prostatism   . Seasonal allergies   . Hyperlipidemia   . Multiple benign nevi    Past Surgical History  Procedure Laterality Date  . Back surgery      T12 - L1  . Spinal fusion      C6-7  . Tonsillectomy    . Vasectomy    . Sinus signs    . Radial nerve    . Mass excision  06/25/2012    Procedure: EXCISION MASS;  Surgeon: Wynonia Sours, MD;  Location: [place];  Service: Orthopedics;  Laterality: Left;  transection of NEUROMA, BURYING RADIAL NERVE IN BRACHIORADIALIS LEFT SIDE   History   Social History  . Marital Status: Married    Spouse Name: N/A    Number of Children: 3  . Years of Education: N/A   Occupational History  . [employer] [place] History Main Topics  . Smoking status: Former Smoker    Quit date: 11/28/2006  . Smokeless tobacco: Not on file  . Alcohol Use: 0.5 oz/week    1 drink(s) per week  . Drug Use: No  . Sexual Activity: Not on file   Other  Topics Concern  . Not on file   Social History Narrative   Lives at home with wife, new 42 year old adopted boy.     Family History  Problem Relation Age of Onset  . Heart attack Father 4    Died with MI  . Heart failure Mother 70    Died age 16   Current Outpatient Prescriptions on File Prior to Visit  Medication Sig Dispense Refill  . amLODipine (NORVASC) 10 MG tablet TAKE 1 TABLET (10 MG TOTAL) BY MOUTH DAILY.  90 tablet  3  . aspirin 81 MG tablet Take 81 mg by mouth daily.      [name] atorvastatin (LIPITOR) 40 MG tablet Take 1 tablet (40 mg total) by mouth daily.  90 tablet  3  . cyclobenzaprine (FLEXERIL) 10 MG tablet Take 1 tablet (10 mg total) by mouth 3 (three) times daily as needed for muscle spasms.  30 tablet  0  . diazepam (VALIUM) 5 MG tablet Take 5 mg by mouth every 8 (eight) hours as needed for anxiety.      . hydrochlorothiazide (HYDRODIURIL) 25 MG tablet Take 1 tablet (25 mg total) by mouth daily.  90 tablet  3  . HYDROcodone-acetaminophen (NORCO/VICODIN) 5-325 MG per tablet Take 1 tablet  by mouth every 4 (four) hours as needed for moderate pain.      [name] levothyroxine (SYNTHROID, LEVOTHROID) 100 MCG tablet Take 1 tablet (100 mcg total) by mouth daily.  90 tablet  3  . meloxicam (MOBIC) 15 MG tablet Take 1 tablet (15 mg total) by mouth daily.  30 tablet  1  . omeprazole (PRILOSEC) 40 MG capsule Take 1 capsule (40 mg total) by mouth daily.  90 capsule  3   No current facility-administered medications on file prior to visit.   Allergies  Allergen Reactions  . Prednisone     im depo medrol  Dizziness passed out  Ask pt before giving  . Doxycycline Hyclate     REACTION: whelps, itching  . Fentanyl   . Penicillins    Immunization History  Administered Date(s) Administered  . Influenza Whole 05/02/2012  . Influenza,inj,Quad PF,36+ Mos 04/28/2013  . Td 04/12/2010   Prior to Admission medications   Medication Sig Start Date End Date Taking? Authorizing Provider   amLODipine (NORVASC) 10 MG tablet TAKE 1 TABLET (10 MG TOTAL) BY MOUTH DAILY. 08/07/13  Yes Vernie Shanks, MD  aspirin 81 MG tablet Take 81 mg by mouth daily.   Yes Historical Provider, MD  atorvastatin (LIPITOR) 40 MG tablet Take 1 tablet (40 mg total) by mouth daily. 03/06/13  Yes Vernie Shanks, MD  cyclobenzaprine (FLEXERIL) 10 MG tablet Take 1 tablet (10 mg total) by mouth 3 (three) times daily as needed for muscle spasms. 10/08/13  Yes Vernie Shanks, MD  diazepam (VALIUM) 5 MG tablet Take 5 mg by mouth every 8 (eight) hours as needed for anxiety.   Yes Historical Provider, MD  hydrochlorothiazide (HYDRODIURIL) 25 MG tablet Take 1 tablet (25 mg total) by mouth daily. 03/06/13  Yes Vernie Shanks, MD  HYDROcodone-acetaminophen (NORCO/VICODIN) 5-325 MG per tablet Take 1 tablet by mouth every 4 (four) hours as needed for moderate pain.   Yes Historical Provider, MD  levothyroxine (SYNTHROID, LEVOTHROID) 100 MCG tablet Take 1 tablet (100 mcg total) by mouth daily. 03/06/13  Yes Vernie Shanks, MD  meloxicam (MOBIC) 15 MG tablet Take 1 tablet (15 mg total) by mouth daily. 10/08/13  Yes Vernie Shanks, MD  mometasone (NASONEX) 50 MCG/ACT nasal spray Place 2 sprays into the nose daily.   Yes Historical Provider, MD  omeprazole (PRILOSEC) 40 MG capsule Take 1 capsule (40 mg total) by mouth daily. 03/06/13  Yes Vernie Shanks, MD     ROS: As above in the HPI. All other systems are stable or negative.  OBJECTIVE: APPEARANCE:  Patient in no acute distress.The patient appeared well nourished and normally developed. Acyanotic. Waist: VITAL SIGNS:BP 130/70  Pulse 74  Temp(Src) 98.5 F (36.9 C) (Oral)  Ht _0  (1.803 m)  Wt 206 lb 9.6 oz (93.713 kg)  BMI 28.83 kg/m2 WM  SKIN: warm and  Dry without overt rashes, tattoos and scars  HEAD and Neck: without JVD, Head and scalp: normal Eyes:No scleral icterus. Fundi normal, eye movements normal. Ears: Auricle normal, canal normal, Tympanic membranes  normal, insufflation normal. Nose: nasal congestion.with rhinitis, boggy swollen nasal mucosa. Throat: normal Neck & thyroid: normal  CHEST & LUNGS: Chest wall: normal Lungs: Clear  CVS: Reveals the PMI to be normally located. Regular rhythm, First and Second Heart sounds are normal,  absence of murmurs, rubs or gallops. Peripheral vasculature: Radial pulses: normal Dorsal pedis pulses: normal Posterior pulses: normal  ABDOMEN:  Appearance: normal  Benign, no organomegaly, no masses, no Abdominal Aortic enlargement. No Guarding , no rebound. No Bruits. Bowel sounds: normal  RECTAL: N/A GU: N/A  EXTREMETIES: nonedematous.  MUSCULOSKELETAL:  Spine: patient cautious with flexion and extension. Joints: intact  NEUROLOGIC: oriented to time,place and person; nonfocal. Strength is normal Sensory is normal Reflexes are normal Cranial Nerves are normal.  ASSESSMENT:  Other and unspecified hyperlipidemia - Plan: Lipid panel, CANCELED: Lipid panel  HYPERTENSION - Plan: CMP14+EGFR, CANCELED: CMP14+EGFR  Unspecified hypothyroidism - Plan: TSH, CANCELED: TSH  GERD (gastroesophageal reflux disease)  Seasonal allergic rhinitis - Plan: mometasone (NASONEX) 50 MCG/ACT nasal spray  Low back strain - resolved  PLAN:  Back strain hand out in the AVS. Monitor BP. DASH diet.  Orders Placed This Encounter  Procedures  . CMP14+EGFR    Standing Status: Future     Number of Occurrences:      Standing Expiration Date: 11/14/2014    Order Specific Question:  Has the patient fasted?    Answer:  Yes  . Lipid panel    Standing Status: Future     Number of Occurrences:      Standing Expiration Date: 11/14/2014    Order Specific Question:  Has the patient fasted?    Answer:  Yes  . TSH    Standing Status: Future     Number of Occurrences:      Standing Expiration Date: 11/14/2014   Meds ordered this encounter  Medications  . mometasone (NASONEX) 50 MCG/ACT nasal spray     Sig: Place 2 sprays into the nose daily.    Dispense:  17 g    Refill:  3   Medications Discontinued During This Encounter  Medication Reason  . mometasone (NASONEX) 50 MCG/ACT nasal spray Reorder   Return in about 4 months (around 03/15/2014) for Recheck medical problems.  Renie Stelmach P. Jacelyn Grip, M.D.

## 2013-11-30 ENCOUNTER — Other Ambulatory Visit: Payer: Self-pay | Admitting: Family Medicine

## 2014-01-04 ENCOUNTER — Other Ambulatory Visit: Payer: Self-pay

## 2014-01-04 DIAGNOSIS — E039 Hypothyroidism, unspecified: Secondary | ICD-10-CM

## 2014-01-04 MED ORDER — LEVOTHYROXINE SODIUM 100 MCG PO TABS: 100.0000 ug | ORAL_TABLET | Freq: Every day | ORAL | Status: DC

## 2014-03-02 ENCOUNTER — Other Ambulatory Visit: Payer: Self-pay | Admitting: *Deleted

## 2014-03-02 MED ORDER — MELOXICAM 15 MG PO TABS: ORAL_TABLET | ORAL | Status: DC

## 2014-03-30 ENCOUNTER — Other Ambulatory Visit: Payer: Self-pay | Admitting: Family

## 2014-04-02 ENCOUNTER — Other Ambulatory Visit: Payer: Self-pay

## 2014-04-02 DIAGNOSIS — I10 Essential (primary) hypertension: Secondary | ICD-10-CM

## 2014-04-02 DIAGNOSIS — E785 Hyperlipidemia, unspecified: Secondary | ICD-10-CM

## 2014-04-02 MED ORDER — HYDROCHLOROTHIAZIDE 25 MG PO TABS: 25.0000 mg | ORAL_TABLET | Freq: Every day | ORAL | Status: DC

## 2014-04-02 MED ORDER — ATORVASTATIN CALCIUM 40 MG PO TABS: 40.0000 mg | ORAL_TABLET | Freq: Every day | ORAL | Status: DC

## 2014-04-02 NOTE — Telephone Encounter (Signed)
Last seen 11/13/13  FPW

## 2014-04-02 NOTE — Telephone Encounter (Signed)
no more refills without being seen  

## 2014-04-02 NOTE — Telephone Encounter (Signed)
Last seen 11/13/13 FPW  Last lipid 11/13/13

## 2014-05-04 ENCOUNTER — Other Ambulatory Visit: Payer: Self-pay | Admitting: *Deleted

## 2014-05-04 MED ORDER — MELOXICAM 15 MG PO TABS: ORAL_TABLET | ORAL | Status: DC

## 2014-05-10 ENCOUNTER — Other Ambulatory Visit: Payer: Self-pay | Admitting: *Deleted

## 2014-05-10 MED ORDER — OMEPRAZOLE 40 MG PO CPDR: 40.0000 mg | DELAYED_RELEASE_CAPSULE | Freq: Every day | ORAL | Status: DC

## 2014-06-03 ENCOUNTER — Other Ambulatory Visit: Payer: Self-pay | Admitting: Family Medicine

## 2014-06-10 ENCOUNTER — Encounter: Payer: Self-pay | Admitting: Family Medicine

## 2014-06-10 ENCOUNTER — Ambulatory Visit (INDEPENDENT_AMBULATORY_CARE_PROVIDER_SITE_OTHER): Payer: BC Managed Care – PPO | Admitting: Family Medicine

## 2014-06-10 ENCOUNTER — Encounter: Payer: Self-pay | Admitting: *Deleted

## 2014-06-10 ENCOUNTER — Encounter (INDEPENDENT_AMBULATORY_CARE_PROVIDER_SITE_OTHER): Payer: BC Managed Care – PPO

## 2014-06-10 VITALS — BP 132/77 | HR 68 | Temp 98.7°F | Ht 71.0 in | Wt 196.0 lb

## 2014-06-10 DIAGNOSIS — K921 Melena: Secondary | ICD-10-CM

## 2014-06-10 DIAGNOSIS — E038 Other specified hypothyroidism: Secondary | ICD-10-CM

## 2014-06-10 DIAGNOSIS — A084 Viral intestinal infection, unspecified: Secondary | ICD-10-CM

## 2014-06-10 DIAGNOSIS — E034 Atrophy of thyroid (acquired): Secondary | ICD-10-CM

## 2014-06-10 LAB — POCT CBC
Granulocyte percent: 71.2 %G (ref 37–80)
HEMATOCRIT: 45.5 % (ref 43.5–53.7)
Hemoglobin: 15.2 g/dL (ref 14.1–18.1)
Lymph, poc: 1.6 (ref 0.6–3.4)
MCH: 29.5 pg (ref 27–31.2)
MCHC: 33.4 g/dL (ref 31.8–35.4)
MCV: 88.2 fL (ref 80–97)
MPV: 8.7 fL (ref 0–99.8)
POC GRANULOCYTE: 5 (ref 2–6.9)
POC LYMPH PERCENT: 22.3 %L (ref 10–50)
Platelet Count, POC: 184 10*3/uL (ref 142–424)
RBC: 5.2 M/uL (ref 4.69–6.13)
RDW, POC: 13.3 %
WBC: 7 10*3/uL (ref 4.6–10.2)

## 2014-06-10 MED ORDER — PROMETHAZINE HCL 25 MG RE SUPP: 25.0000 mg | Freq: Four times a day (QID) | RECTAL | Status: DC | PRN

## 2014-06-10 NOTE — Patient Instructions (Addendum)
Remain out of work and get rest and follow diet indicated below Avoid anti-inflammatory medicines like ibuprofen Hold the cholesterol medicine through the weekend Return the FOBT    Clear liquids for 24 hours (like 7-Up, ginger ale, Sprite, Jello, frozen pops) Full liquids the second 24-hours (like potato soup, tomato soup, chicken noodle soup) Bland diet the third 24-hours (boiled and baked foods, no fried or greasy foods) Avoid milk, cheese, ice cream and dairy products for 72 hours. Avoid caffeine (cola drinks, coffee, tea, Mountain Dew, Mellow Yellow) Take in small amounts, but frequently. Tylenol as needed for aches pains and fever

## 2014-06-10 NOTE — Progress Notes (Signed)
Subjective:    Patient ID: Jeffrey Hooper, male    DOB: March 10, 1961, 53 y.o.   MRN: [id number]  HPI Patient here today for gi up set. This started on Sunday with Headache, fever, and vomiting. Now his symptoms include black stools, nausea and diarrhea.        Patient Active Problem List   Diagnosis Date Noted  . Low back strain 11/13/2013  . Seasonal allergic rhinitis 11/13/2013  . Annual physical exam 08/07/2013  . Multiple benign nevi   . Need for prophylactic vaccination and inoculation against influenza 04/28/2013  . Hyperlipidemia   . Tinnitus of both ears 03/06/2013  . Other and unspecified hyperlipidemia 10/29/2012  . Unspecified hypothyroidism 10/29/2012  . GERD (gastroesophageal reflux disease) 10/29/2012  . Prostatitis, chronic 10/29/2012  . Chest pain 11/28/2011  . Dizziness 11/28/2011  . HYPERTENSION 06/11/2008  . PULMONARY NODULE 06/11/2008   Outpatient Encounter Prescriptions as of 06/10/2014  Medication Sig  . amLODipine (NORVASC) 10 MG tablet TAKE 1 TABLET (10 MG TOTAL) BY MOUTH DAILY.  [name] aspirin 81 MG tablet Take 81 mg by mouth daily.  [name] atorvastatin (LIPITOR) 40 MG tablet Take 1 tablet (40 mg total) by mouth daily.  . hydrochlorothiazide (HYDRODIURIL) 25 MG tablet Take 1 tablet (25 mg total) by mouth daily.  [name] levothyroxine (SYNTHROID, LEVOTHROID) 100 MCG tablet Take 1 tablet (100 mcg total) by mouth daily.  . meloxicam (MOBIC) 15 MG tablet TAKE 1 TABLET (15 MG TOTAL) BY MOUTH DAILY.  . mometasone (NASONEX) 50 MCG/ACT nasal spray Place 2 sprays into the nose daily.  [name] omeprazole (PRILOSEC) 40 MG capsule Take 1 capsule (40 mg total) by mouth daily.  . [DISCONTINUED] cyclobenzaprine (FLEXERIL) 10 MG tablet Take 1 tablet (10 mg total) by mouth 3 (three) times daily as needed for muscle spasms.  . [DISCONTINUED] diazepam (VALIUM) 5 MG tablet Take 5 mg by mouth every 8 (eight) hours as needed for anxiety.  . [DISCONTINUED] HYDROcodone-acetaminophen  (NORCO/VICODIN) 5-325 MG per tablet Take 1 tablet by mouth every 4 (four) hours as needed for moderate pain.     Review of Systems  Constitutional: Negative.  Negative for fever.  HENT: Negative.   Eyes: Negative.   Respiratory: Negative.   Cardiovascular: Negative.   Gastrointestinal: Positive for nausea and diarrhea.       Dark stools  Endocrine: Negative.   Genitourinary: Negative.   Musculoskeletal: Negative.   Skin: Negative.   Allergic/Immunologic: Negative.   Neurological: Positive for dizziness.  Hematological: Negative.   Psychiatric/Behavioral: Negative.       Objective:   Physical Exam  Constitutional: He is oriented to person, place, and time. He appears well-developed and well-nourished. No distress.  The patient is alert with good coloring  HENT:  Head: Normocephalic and atraumatic.  Right Ear: External ear normal.  Left Ear: External ear normal.  Nose: Nose normal.  Mouth/Throat: Oropharynx is clear and moist. No oropharyngeal exudate.  Eyes: Conjunctivae and EOM are normal. Pupils are equal, round, and reactive to light. Right eye exhibits no discharge. Left eye exhibits no discharge. No scleral icterus.  Neck: Normal range of motion. Neck supple. No thyromegaly present.  Cardiovascular: Normal rate, regular rhythm and normal heart sounds.   No murmur heard. At 72/m  Pulmonary/Chest: Effort normal and breath sounds normal. No respiratory distress. He has no wheezes. He has no rales. He exhibits no tenderness.  Abdominal: Soft. Bowel sounds are normal. He exhibits no mass. There is tenderness. There is no  rebound and no guarding.  There was slight tenderness in the right upper quadrant right lower quadrant anterior umbilical area  Musculoskeletal: Normal range of motion.  Lymphadenopathy:    He has no cervical adenopathy.  Neurological: He is alert and oriented to person, place, and time.  Skin: Skin is warm and dry. No rash noted. He is not diaphoretic.    Psychiatric: He has a normal mood and affect. His behavior is normal. Judgment and thought content normal.  Nursing note and vitals reviewed.  BP 132/77 mmHg  Pulse 68  Temp(Src) 98.7 F (37.1 C) (Oral)  Ht 5\' 11"  (1.803 m)  Wt 196 lb (88.905 kg)  BMI 27.35 kg/m2   The CBC done in the office today was discussed with the patient and the white blood cell count was not elevated, the hemoglobin was good, the platelet count was adequate.      Assessment & Plan:  1. Viral gastroenteritis  2. Melena  3. Hypothyroidism due to acquired atrophy of thyroid  Patient Instructions  Remain out of work and get rest and follow diet indicated below Avoid anti-inflammatory medicines like ibuprofen Hold the cholesterol medicine through the weekend Return the FOBT    Clear liquids for 24 hours (like 7-Up, ginger ale, Sprite, Jello, frozen pops) Full liquids the second 24-hours (like potato soup, tomato soup, chicken noodle soup) Bland diet the third 24-hours (boiled and baked foods, no fried or greasy foods) Avoid milk, cheese, ice cream and dairy products for 72 hours. Avoid caffeine (cola drinks, coffee, tea, Mountain Dew, Mellow Yellow) Take in small amounts, but frequently. Tylenol as needed for aches pains and fever    Arrie Senate MD

## 2014-06-11 ENCOUNTER — Other Ambulatory Visit: Payer: BC Managed Care – PPO

## 2014-06-11 DIAGNOSIS — Z1212 Encounter for screening for malignant neoplasm of rectum: Secondary | ICD-10-CM

## 2014-06-11 LAB — THYROID PANEL WITH TSH
Free Thyroxine Index: 3 (ref 1.2–4.9)
T3 Uptake Ratio: 24 % (ref 24–39)
T4 TOTAL: 12.3 ug/dL — AB (ref 4.5–12.0)
TSH: 1.94 u[IU]/mL (ref 0.450–4.500)

## 2014-06-11 LAB — BMP8+EGFR
BUN / CREAT RATIO: 16 (ref 9–20)
BUN: 17 mg/dL (ref 6–24)
CO2: 28 mmol/L (ref 18–29)
Calcium: 9.2 mg/dL (ref 8.7–10.2)
Chloride: 97 mmol/L (ref 97–108)
Creatinine, Ser: 1.06 mg/dL (ref 0.76–1.27)
GFR calc non Af Amer: 80 mL/min/{1.73_m2} (ref >59)
GFR, EST AFRICAN AMERICAN: 92 mL/min/{1.73_m2} (ref >59)
Glucose: 116 mg/dL — ABNORMAL HIGH (ref 65–99)
Potassium: 3.3 mmol/L — ABNORMAL LOW (ref 3.5–5.2)
Sodium: 141 mmol/L (ref 134–144)

## 2014-06-11 NOTE — Progress Notes (Signed)
Lab only 

## 2014-06-12 LAB — FECAL OCCULT BLOOD, IMMUNOCHEMICAL: FECAL OCCULT BLD: NEGATIVE

## 2014-06-14 ENCOUNTER — Telehealth: Payer: Self-pay | Admitting: *Deleted

## 2014-06-14 NOTE — Telephone Encounter (Signed)
Aware of results. 

## 2014-06-14 NOTE — Telephone Encounter (Signed)
-----   Message from Chipper Herb, MD sent at 06/11/2014  4:20 PM EST ----- The blood sugar is slightly elevated at 116. The creatinine, the most important kidney function test is within normal limits. The patient's potassium is 3.3 and this is slightly decreased is most likely consistent with his loose bowel movements. He should have a BMP repeated in one week when he is feeling better.++++ The thyroid profile is within normal limits. He should continue his current dose of thyroid medication.

## 2014-06-30 ENCOUNTER — Other Ambulatory Visit: Payer: Self-pay | Admitting: Nurse Practitioner

## 2014-08-07 ENCOUNTER — Other Ambulatory Visit: Payer: Self-pay | Admitting: Nurse Practitioner

## 2014-08-07 ENCOUNTER — Other Ambulatory Visit: Payer: Self-pay | Admitting: Family Medicine

## 2014-08-09 NOTE — Telephone Encounter (Signed)
Last seen 06/20/14 DWM  Last lipid 11/13/13

## 2014-09-06 ENCOUNTER — Ambulatory Visit (INDEPENDENT_AMBULATORY_CARE_PROVIDER_SITE_OTHER): Payer: BLUE CROSS/BLUE SHIELD | Admitting: Family Medicine

## 2014-09-06 ENCOUNTER — Encounter: Payer: Self-pay | Admitting: Family Medicine

## 2014-09-06 VITALS — BP 134/79 | HR 90 | Temp 97.8°F | Ht 71.0 in | Wt 208.4 lb

## 2014-09-06 DIAGNOSIS — R509 Fever, unspecified: Secondary | ICD-10-CM

## 2014-09-06 DIAGNOSIS — H6503 Acute serous otitis media, bilateral: Secondary | ICD-10-CM

## 2014-09-06 DIAGNOSIS — R52 Pain, unspecified: Secondary | ICD-10-CM

## 2014-09-06 DIAGNOSIS — J029 Acute pharyngitis, unspecified: Secondary | ICD-10-CM

## 2014-09-06 LAB — POCT INFLUENZA A/B
INFLUENZA B, POC: NEGATIVE
Influenza A, POC: NEGATIVE

## 2014-09-06 LAB — POCT RAPID STREP A (OFFICE): Rapid Strep A Screen: NEGATIVE

## 2014-09-06 MED ORDER — CEFUROXIME AXETIL 500 MG PO TABS: 500.0000 mg | ORAL_TABLET | Freq: Two times a day (BID) | ORAL | Status: AC

## 2014-09-06 NOTE — Progress Notes (Signed)
   Subjective:    Patient ID: Jeffrey Hooper, male    DOB: 1960-10-06, 54 y.o.   MRN: [id number]  HPI  Patient is here today for complaints of sore throat, fever and headache that started on Friday. Onset 3 days ago with worsening since that time of symptoms primarily frontal and periorbital headache. Subjective fever that is moderate. And a throat that feels like he is swallowing a Brillo pad with the pain in between swallowing as well.    Review of Systems  Constitutional: Positive for fever, chills and fatigue. Negative for diaphoresis and unexpected weight change.  HENT: Positive for congestion and rhinorrhea. Negative for hearing loss, sore throat and trouble swallowing.   Respiratory: Negative for cough, chest tightness, shortness of breath and wheezing.   Gastrointestinal: Negative for nausea, vomiting, abdominal pain, diarrhea, constipation and abdominal distention.  Endocrine: Negative for cold intolerance and heat intolerance.  Genitourinary: Negative for dysuria.  Musculoskeletal: Negative for joint swelling and arthralgias.  Skin: Negative for rash.  Neurological: Negative for dizziness.  Psychiatric/Behavioral: Negative for dysphoric mood, decreased concentration and agitation. The patient is not nervous/anxious.        Objective:   Physical Exam  Constitutional: He appears well-developed and well-nourished.  HENT:  Head: Normocephalic and atraumatic.  Right Ear: Tympanic membrane and external ear normal. No decreased hearing is noted.  Left Ear: Tympanic membrane and external ear normal. No decreased hearing is noted.  Nose: Mucosal edema present. Right sinus exhibits no frontal sinus tenderness. Left sinus exhibits no frontal sinus tenderness.  Mouth/Throat: No oropharyngeal exudate or posterior oropharyngeal erythema.  Neck: No Brudzinski's sign noted.  Pulmonary/Chest: Breath sounds normal. No respiratory distress.  Lymphadenopathy:       Head (right side): No  preauricular adenopathy present.       Head (left side): No preauricular adenopathy present.       Right cervical: No superficial cervical adenopathy present.      Left cervical: No superficial cervical adenopathy present.   BP 134/79 mmHg  Pulse 90  Temp(Src) 97.8 F (36.6 C) (Oral)  Ht 5\' 11"  (1.803 m)  Wt 208 lb 6.4 oz (94.53 kg)  BMI 29.08 kg/m2        Assessment & Plan:   1. Body aches   2. Other specified fever   3. Sore throat   4. Bilateral acute serous otitis media, recurrence not specified     Meds ordered this encounter  Medications  . cefUROXime (CEFTIN) 500 MG tablet    Sig: Take 1 tablet (500 mg total) by mouth 2 (two) times daily with a meal.    Dispense:  20 tablet    Refill:  0    Orders Placed This Encounter  Procedures  . POCT rapid strep A  . POCT Influenza A/B    Labs pending Health Maintenance reviewed Diet and exercise encouraged Continue all meds as discussed Follow up in 6 mos, CPE  Claretta Fraise, MD

## 2014-09-30 ENCOUNTER — Other Ambulatory Visit: Payer: Self-pay | Admitting: *Deleted

## 2014-09-30 DIAGNOSIS — I10 Essential (primary) hypertension: Secondary | ICD-10-CM

## 2014-09-30 MED ORDER — AMLODIPINE BESYLATE 10 MG PO TABS: ORAL_TABLET | ORAL | Status: DC

## 2014-11-04 ENCOUNTER — Other Ambulatory Visit: Payer: Self-pay | Admitting: Family Medicine

## 2014-11-05 NOTE — Telephone Encounter (Signed)
Last seen 09/06/14 Dr Livia Snellen  Last lipid 11/13/13  Requesting 90 day supply

## 2014-11-14 ENCOUNTER — Other Ambulatory Visit: Payer: Self-pay | Admitting: Family Medicine

## 2014-12-14 ENCOUNTER — Encounter (INDEPENDENT_AMBULATORY_CARE_PROVIDER_SITE_OTHER): Payer: Self-pay

## 2014-12-14 ENCOUNTER — Encounter: Payer: Self-pay | Admitting: Family Medicine

## 2014-12-14 ENCOUNTER — Ambulatory Visit (INDEPENDENT_AMBULATORY_CARE_PROVIDER_SITE_OTHER): Payer: BLUE CROSS/BLUE SHIELD

## 2014-12-14 ENCOUNTER — Ambulatory Visit (INDEPENDENT_AMBULATORY_CARE_PROVIDER_SITE_OTHER): Payer: BLUE CROSS/BLUE SHIELD | Admitting: Family Medicine

## 2014-12-14 VITALS — BP 132/77 | HR 63 | Temp 98.0°F | Ht 71.0 in | Wt 212.0 lb

## 2014-12-14 DIAGNOSIS — M545 Low back pain: Secondary | ICD-10-CM

## 2014-12-14 DIAGNOSIS — R1013 Epigastric pain: Secondary | ICD-10-CM | POA: Diagnosis not present

## 2014-12-14 DIAGNOSIS — R142 Eructation: Secondary | ICD-10-CM

## 2014-12-14 LAB — POCT URINALYSIS DIPSTICK
Bilirubin, UA: NEGATIVE
GLUCOSE UA: NEGATIVE
KETONES UA: NEGATIVE
LEUKOCYTES UA: NEGATIVE
Nitrite, UA: NEGATIVE
Protein, UA: NEGATIVE
SPEC GRAV UA: 1.025
Urobilinogen, UA: NEGATIVE
pH, UA: 5

## 2014-12-14 LAB — POCT UA - MICROSCOPIC ONLY
Casts, Ur, LPF, POC: NEGATIVE
Crystals, Ur, HPF, POC: NEGATIVE
Mucus, UA: NEGATIVE
WBC, UR, HPF, POC: NEGATIVE
Yeast, UA: NEGATIVE

## 2014-12-14 NOTE — Patient Instructions (Signed)
The patient should avoid NSAIDs fried foods alcohol and caffeine as much as possible He should increase his omeprazole to 40 mg twice daily at least for 1 week He should return the FOBT We will call him with the lab work results and x-ray results as soon as they become available We will arrange for him to see a gastroenterologist for a possible endoscopy because of the discomfort with eating and the history of a hiatal hernia.
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
## 2014-12-15 ENCOUNTER — Other Ambulatory Visit: Payer: Self-pay | Admitting: Family Medicine

## 2014-12-15 ENCOUNTER — Other Ambulatory Visit: Payer: BLUE CROSS/BLUE SHIELD

## 2014-12-15 ENCOUNTER — Encounter: Payer: Self-pay | Admitting: Physician Assistant

## 2014-12-15 DIAGNOSIS — Z1212 Encounter for screening for malignant neoplasm of rectum: Secondary | ICD-10-CM

## 2014-12-15 LAB — CBC WITH DIFFERENTIAL/PLATELET
Basophils Absolute: 0.1 10*3/uL (ref 0.0–0.2)
Basos: 1 %
EOS (ABSOLUTE): 0.1 10*3/uL (ref 0.0–0.4)
EOS: 1 %
Hematocrit: 41.6 % (ref 37.5–51.0)
Hemoglobin: 14.8 g/dL (ref 12.6–17.7)
IMMATURE GRANS (ABS): 0 10*3/uL (ref 0.0–0.1)
IMMATURE GRANULOCYTES: 0 %
Lymphocytes Absolute: 1.5 10*3/uL (ref 0.7–3.1)
Lymphs: 20 %
MCH: 30.7 pg (ref 26.6–33.0)
MCHC: 35.6 g/dL (ref 31.5–35.7)
MCV: 86 fL (ref 79–97)
MONOCYTES: 9 %
Monocytes Absolute: 0.7 10*3/uL (ref 0.1–0.9)
NEUTROS PCT: 69 %
Neutrophils Absolute: 5.4 10*3/uL (ref 1.4–7.0)
PLATELETS: 217 10*3/uL (ref 150–379)
RBC: 4.82 x10E6/uL (ref 4.14–5.80)
RDW: 13.5 % (ref 12.3–15.4)
WBC: 7.7 10*3/uL (ref 3.4–10.8)

## 2014-12-15 LAB — HEPATIC FUNCTION PANEL
ALK PHOS: 90 IU/L (ref 39–117)
ALT: 31 IU/L (ref 0–44)
AST: 21 IU/L (ref 0–40)
Albumin: 4.8 g/dL (ref 3.5–5.5)
BILIRUBIN TOTAL: 0.7 mg/dL (ref 0.0–1.2)
BILIRUBIN, DIRECT: 0.2 mg/dL (ref 0.00–0.40)
Total Protein: 6.6 g/dL (ref 6.0–8.5)

## 2014-12-15 LAB — BMP8+EGFR
BUN/Creatinine Ratio: 18 (ref 9–20)
BUN: 19 mg/dL (ref 6–24)
CALCIUM: 9.7 mg/dL (ref 8.7–10.2)
CO2: 26 mmol/L (ref 18–29)
CREATININE: 1.03 mg/dL (ref 0.76–1.27)
Chloride: 100 mmol/L (ref 97–108)
GFR calc Af Amer: 95 mL/min/{1.73_m2} (ref >59)
GFR, EST NON AFRICAN AMERICAN: 83 mL/min/{1.73_m2} (ref >59)
Glucose: 128 mg/dL — ABNORMAL HIGH (ref 65–99)
Potassium: 3.9 mmol/L (ref 3.5–5.2)
SODIUM: 141 mmol/L (ref 134–144)

## 2014-12-15 NOTE — Progress Notes (Signed)
Lab only 

## 2014-12-17 LAB — FECAL OCCULT BLOOD, IMMUNOCHEMICAL: FECAL OCCULT BLD: POSITIVE — AB

## 2014-12-22 ENCOUNTER — Other Ambulatory Visit: Payer: BLUE CROSS/BLUE SHIELD

## 2014-12-22 ENCOUNTER — Other Ambulatory Visit: Payer: Self-pay | Admitting: Family Medicine

## 2014-12-23 LAB — FECAL OCCULT BLOOD, IMMUNOCHEMICAL: Fecal Occult Bld: NEGATIVE

## 2014-12-26 ENCOUNTER — Other Ambulatory Visit: Payer: Self-pay | Admitting: Family Medicine

## 2014-12-31 ENCOUNTER — Other Ambulatory Visit: Payer: Self-pay | Admitting: Family Medicine

## 2014-12-31 ENCOUNTER — Encounter: Payer: Self-pay | Admitting: Physician Assistant

## 2014-12-31 ENCOUNTER — Ambulatory Visit (INDEPENDENT_AMBULATORY_CARE_PROVIDER_SITE_OTHER): Payer: BLUE CROSS/BLUE SHIELD | Admitting: Physician Assistant

## 2014-12-31 VITALS — BP 128/68 | HR 63 | Ht 69.0 in | Wt 206.0 lb

## 2014-12-31 DIAGNOSIS — R195 Other fecal abnormalities: Secondary | ICD-10-CM | POA: Diagnosis not present

## 2014-12-31 DIAGNOSIS — R1013 Epigastric pain: Secondary | ICD-10-CM | POA: Diagnosis not present

## 2014-12-31 DIAGNOSIS — M47817 Spondylosis without myelopathy or radiculopathy, lumbosacral region: Secondary | ICD-10-CM

## 2014-12-31 DIAGNOSIS — M545 Low back pain: Secondary | ICD-10-CM

## 2014-12-31 NOTE — Patient Instructions (Addendum)
Increase the Omeprazole to 40 mg twice daily. You have been scheduled for an endoscopy. Please follow written instructions given to you at your visit today. If you use inhalers (even only as needed), please bring them with you on the day of your procedure. Your physician has requested that you go to www.startemmi.com and enter the access code given to you at your visit today. This web site gives a general overview about your procedure. However, you should still follow specific instructions given to you by our office regarding your preparation for the procedure.   Normal BMI (Body Mass Index- based on height and weight) is between 19 and 25. Your BMI today is Body mass index is 30.41 kg/(m^2). [name] Please consider follow up  regarding your BMI with your Primary Care Provider.

## 2014-12-31 NOTE — Progress Notes (Signed)
Patient ID: MINOR IDEN, male   DOB: [birth year], 54 y.o.   MRN: [id number]   Subjective:    Patient ID: Jeffrey Hooper, male    DOB: [birth year], 54 y.o.   MRN: [id number]  HPI Jeffrey Hooper is a pleasant 54 year old white male [place] referred today by Dr. Morrie Sheldon for evaluation of upper abdominal pain. Patient generally in good health he does have history of GERD, prostatitis, hypertension. He relates having a prior colonoscopy done in 2008 at [facility] This was done per Dr. Rowe Pavy and was a normal exam. Patient says that he's been having upper abdominal pain and discomfort over the past 3-4 weeks. He describes this as a fairly constant sensation of a knot or lump or fullness in his epigastrium. Does not necessarily seem to be affected by eating and is no worse postprandially. He denies dysphagia or odynophagia. He says pain level varies somewhat throughout the day but there is a constant pressure type feeling which is nonradiating. He has not had any nausea or vomiting. No melena or hematochezia and no change in his bowel habits. Appetite has been okay and his weight is stable. He did do Hemoccult cards and one was +1 negative for occult blood. No regular aspirin or NSAID use no EtOH. No prior abdominal surgeries. Baseline labs done were unremarkable. Patient had been on omeprazole 40 mg chronically for GERD this was increased for a week to twice daily and he said that did seem to make some difference but he was told only to stay on it twice daily for 1 week.  Review of Systems Pertinent positive and negative review of systems were noted in the above HPI section.  All other review of systems was otherwise negative.  Outpatient Encounter Prescriptions as of 12/31/2014  Medication Sig  . Melatonin 10 MG TABS Take by mouth. 10mg  one time at bedtime  . amLODipine (NORVASC) 10 MG tablet TAKE ONE TABLET BY MOUTH ONE TIME DAILY  . aspirin 81 MG tablet Take 81 mg by mouth daily.  [name] atorvastatin  (LIPITOR) 40 MG tablet TAKE ONE TABLET BY MOUTH ONE TIME DAILY  . hydrochlorothiazide (HYDRODIURIL) 25 MG tablet TAKE ONE TABLET BY MOUTH ONE TIME DAILY  . levothyroxine (SYNTHROID, LEVOTHROID) 100 MCG tablet TAKE ONE TABLET BY MOUTH ONE TIME DAILY  . mometasone (NASONEX) 50 MCG/ACT nasal spray Place 2 sprays into the nose daily.  [name] omeprazole (PRILOSEC) 40 MG capsule TAKE ONE CAPSULE BY MOUTH ONE TIME DAILY   No facility-administered encounter medications on file as of 12/31/2014.   Allergies  Allergen Reactions  . Prednisone     im depo medrol  Dizziness passed out  Ask pt before giving  . Doxycycline Hyclate     REACTION: whelps, itching  . Fentanyl   . Penicillins    Patient Active Problem List   Diagnosis Date Noted  . Low back strain 11/13/2013  . Seasonal allergic rhinitis 11/13/2013  . Annual physical exam 08/07/2013  . Multiple benign nevi   . Need for prophylactic vaccination and inoculation against influenza 04/28/2013  . Hyperlipidemia   . Tinnitus of both ears 03/06/2013  . Other and unspecified hyperlipidemia 10/29/2012  . Unspecified hypothyroidism 10/29/2012  . GERD (gastroesophageal reflux disease) 10/29/2012  . Prostatitis, chronic 10/29/2012  . Chest pain 11/28/2011  . Dizziness 11/28/2011  . HYPERTENSION 06/11/2008  . PULMONARY NODULE 06/11/2008   History   Social History  . Marital Status: Married    Spouse Name: N/A  .  Number of Children: 3  . Years of Education: N/A   Occupational History  . [employer] History Main Topics  . Smoking status: Former Smoker    Quit date: 11/28/2006  . Smokeless tobacco: Never Used  . Alcohol Use: 0.0 oz/week    0 Standard drinks or equivalent per week  . Drug Use: No  . Sexual Activity: Not on file   Other Topics Concern  . Not on file   Social History Narrative   Lives at home with wife, new 54 year old adopted boy.      Mr. Manera family history includes Heart attack (age  of onset: 59) in his father; Heart failure (age of onset: 53) in his mother.      Objective:    Filed Vitals:   12/31/14 0949  BP: 128/68  Pulse: 63    Physical Exam  well-developed white male in no acute distress, pleasant blood pressure 128/60 pulse 63 height 5 foot 9 weight 206. HEENT; nontraumatic normocephalic EOMI PERRLA sclera anicteric, Supple ;no JVD, Cardiovascular; regular rate and rhythm with S1-S2 no murmur or gallop, Pulmonary; clear bilaterally, Abdomen; soft, nondistended, no palpable mass or hepatosplenomegaly bowel sounds are present is no succussion splash, he is tender in the epigastrium and left upper quadrant. He was documented to have Hemoccult-positive stool by PCP, Ext; no clubbing cyanosis or edema skin warm dry, Psych; mood and affect appropriate       Assessment & Plan:   #1 54 yo WM with one month hx of constant epigastric pain/fullness/pressure. Etiology not clear - r/o PUD, Gastric lesion ,pancreatic lesion #2 heme + stool #3 Colonoscopy 2008- Dr . Rowe Pavy - negative #4 HTN #5 hx prostatitis #5 GERD  Plan; Increase Omeprazole back to 40 mg BID x one month  Schedule for EGD with Dr. Ardis Hughs . Procedure discussed in detail with pt and he is agreeable to proceed. As procedure can be done within a week will hold off on imaging. If EGD negative will need Ct Abd/pelvis   Amy S Esterwood PA-C 12/31/2014   Cc: Chipper Herb, MD

## 2015-01-04 NOTE — Progress Notes (Signed)
i agree with the above note, plan 

## 2015-01-05 ENCOUNTER — Ambulatory Visit (INDEPENDENT_AMBULATORY_CARE_PROVIDER_SITE_OTHER): Payer: BLUE CROSS/BLUE SHIELD | Admitting: Family

## 2015-01-05 ENCOUNTER — Encounter: Payer: Self-pay | Admitting: Family

## 2015-01-05 ENCOUNTER — Encounter: Payer: Self-pay | Admitting: Gastroenterology

## 2015-01-05 VITALS — BP 132/76 | HR 66 | Temp 97.0°F | Ht 69.0 in | Wt 210.0 lb

## 2015-01-05 DIAGNOSIS — M5442 Lumbago with sciatica, left side: Secondary | ICD-10-CM

## 2015-01-05 MED ORDER — MELOXICAM 15 MG PO TABS: 15.0000 mg | ORAL_TABLET | Freq: Every day | ORAL | Status: DC

## 2015-01-05 MED ORDER — CYCLOBENZAPRINE HCL 10 MG PO TABS: 10.0000 mg | ORAL_TABLET | Freq: Three times a day (TID) | ORAL | Status: DC | PRN

## 2015-01-05 MED ORDER — KETOROLAC TROMETHAMINE 60 MG/2ML IM SOLN
60.0000 mg | Freq: Once | INTRAMUSCULAR | Status: AC
  Administered 2015-01-05: 60 mg via INTRAMUSCULAR

## 2015-01-05 NOTE — Patient Instructions (Addendum)
Sciatica Sciatica is pain, weakness, numbness, or tingling along the path of the sciatic nerve. The nerve starts in the lower back and runs down the back of each leg. The nerve controls the muscles in the lower leg and in the back of the knee, while also providing sensation to the back of the thigh, lower leg, and the sole of your foot. Sciatica is a symptom of another medical condition. For instance, nerve damage or certain conditions, such as a herniated disk or bone spur on the spine, pinch or put pressure on the sciatic nerve. This causes the pain, weakness, or other sensations normally associated with sciatica. Generally, sciatica only affects one side of the body. CAUSES   Herniated or slipped disc.  Degenerative disk disease.  A pain disorder involving the narrow muscle in the buttocks (piriformis syndrome).  Pelvic injury or fracture.  Pregnancy.  Tumor (rare). SYMPTOMS  Symptoms can vary from mild to very severe. The symptoms usually travel from the low back to the buttocks and down the back of the leg. Symptoms can include:  Mild tingling or dull aches in the lower back, leg, or hip.  Numbness in the back of the calf or sole of the foot.  Burning sensations in the lower back, leg, or hip.  Sharp pains in the lower back, leg, or hip.  Leg weakness.  Severe back pain inhibiting movement. These symptoms may get worse with coughing, sneezing, laughing, or prolonged sitting or standing. Also, being overweight may worsen symptoms. DIAGNOSIS  Your caregiver will perform a physical exam to look for common symptoms of sciatica. He or she may ask you to do certain movements or activities that would trigger sciatic nerve pain. Other tests may be performed to find the cause of the sciatica. These may include:  Blood tests.  X-rays.  Imaging tests, such as an MRI or CT scan. TREATMENT  Treatment is directed at the cause of the sciatic pain. Sometimes, treatment is not necessary  and the pain and discomfort goes away on its own. If treatment is needed, your caregiver may suggest:  Over-the-counter medicines to relieve pain.  Prescription medicines, such as anti-inflammatory medicine, muscle relaxants, or narcotics.  Applying heat or ice to the painful area.  Steroid injections to lessen pain, irritation, and inflammation around the nerve.  Reducing activity during periods of pain.  Exercising and stretching to strengthen your abdomen and improve flexibility of your spine. Your caregiver may suggest losing weight if the extra weight makes the back pain worse.  Physical therapy.  Surgery to eliminate what is pressing or pinching the nerve, such as a bone spur or part of a herniated disk. HOME CARE INSTRUCTIONS   Only take over-the-counter or prescription medicines for pain or discomfort as directed by your caregiver.  Apply ice to the affected area for 20 minutes, 3-4 times a day for the first 48-72 hours. Then try heat in the same way.  Exercise, stretch, or perform your usual activities if these do not aggravate your pain.  Attend physical therapy sessions as directed by your caregiver.  Keep all follow-up appointments as directed by your caregiver.  Do not wear high heels or shoes that do not provide proper support.  Check your mattress to see if it is too soft. A firm mattress may lessen your pain and discomfort. SEEK IMMEDIATE MEDICAL CARE IF:   You lose control of your bowel or bladder (incontinence).  You have increasing weakness in the lower back, pelvis, buttocks,   or legs.  You have redness or swelling of your back.  You have a burning sensation when you urinate.  You have pain that gets worse when you lie down or awakens you at night.  Your pain is worse than you have experienced in the past.  Your pain is lasting longer than 4 weeks.  You are suddenly losing weight without reason. MAKE SURE YOU:  Understand these  instructions.  Will watch your condition.  Will get help right away if you are not doing well or get worse. Document Released: 07/17/2001 Document Revised: 01/22/2012 Document Reviewed: 12/02/2011 Exit[employer] Patient Information 2015 [employer]. This information is not intended to replace advice given to you by your health care provider. Make sure you discuss any questions you have with your health care provider.  

## 2015-01-05 NOTE — Progress Notes (Signed)
   Subjective:    Patient ID: Jeffrey Hooper, male    DOB: 1961/03/01, 54 y.o.   MRN: [id number]  Back Pain This is a chronic problem. The current episode started more than 1 month ago. The problem occurs constantly. The problem is unchanged. The pain is present in the lumbar spine. The quality of the pain is described as aching. The pain radiates to the left thigh. The pain is at a severity of 1/10. The pain is mild. The symptoms are aggravated by lying down. Associated symptoms include leg pain. Pertinent negatives include no bladder incontinence, bowel incontinence or dysuria. He has tried ice, heat and NSAIDs for the symptoms. The treatment provided mild relief.      Review of Systems  Constitutional: Negative.   HENT: Negative.   Respiratory: Negative.   Cardiovascular: Negative.   Gastrointestinal: Negative.  Negative for bowel incontinence.  Endocrine: Negative.   Genitourinary: Negative.  Negative for bladder incontinence and dysuria.  Musculoskeletal: Positive for back pain.  Neurological: Negative.   Hematological: Negative.   Psychiatric/Behavioral: Negative.   All other systems reviewed and are negative.      Objective:   Physical Exam  Constitutional: He is oriented to person, place, and time. He appears well-developed and well-nourished. No distress.  HENT:  Head: Normocephalic.  Right Ear: External ear normal.  Left Ear: External ear normal.  Mouth/Throat: Oropharynx is clear and moist.  Eyes: Pupils are equal, round, and reactive to light. Right eye exhibits no discharge. Left eye exhibits no discharge.  Neck: Normal range of motion. Neck supple. No thyromegaly present.  Cardiovascular: Normal rate, regular rhythm, normal heart sounds and intact distal pulses.   No murmur heard. Pulmonary/Chest: Effort normal and breath sounds normal. No respiratory distress. He has no wheezes.  Abdominal: Soft. Bowel sounds are normal. He exhibits no distension. There is no  tenderness.  Musculoskeletal: Normal range of motion. He exhibits no edema or tenderness.  Neurological: He is alert and oriented to person, place, and time. He has normal reflexes. No cranial nerve deficit.  Skin: Skin is warm and dry. No rash noted. No erythema.  Psychiatric: He has a normal mood and affect. His behavior is normal. Judgment and thought content normal.  Vitals reviewed.     BP 132/76 mmHg  Pulse 66  Temp(Src) 97 F (36.1 C) (Oral)  Ht $R'5\' 9"'mT$  (1.753 m)  Wt 210 lb (95.255 kg)  BMI 31.00 kg/m2     Assessment & Plan:  1. Left-sided low back pain with left-sided sciatica -Rest -Heat as needed -Sedation precaution discussed -No other NSAID's while taking mobic -RTO prn - ketorolac (TORADOL) injection 60 mg; Inject 2 mLs (60 mg total) into the muscle once. - cyclobenzaprine (FLEXERIL) 10 MG tablet; Take 1 tablet (10 mg total) by mouth 3 (three) times daily as needed for muscle spasms.  Dispense: 30 tablet; Refill: 0 - meloxicam (MOBIC) 15 MG tablet; Take 1 tablet (15 mg total) by mouth daily.  Dispense: 30 tablet; Refill: 0 - BMP8+EGFR  Evelina Dun, FNP

## 2015-01-06 ENCOUNTER — Ambulatory Visit (HOSPITAL_COMMUNITY)
Admission: RE | Admit: 2015-01-06 | Discharge: 2015-01-06 | Disposition: A | Discharge status: Home / Self Care | Payer: BLUE CROSS/BLUE SHIELD | Source: Ambulatory Visit | Attending: Family Medicine | Admitting: Family Medicine

## 2015-01-06 DIAGNOSIS — M4806 Spinal stenosis, lumbar region: Secondary | ICD-10-CM | POA: Diagnosis not present

## 2015-01-06 DIAGNOSIS — M47817 Spondylosis without myelopathy or radiculopathy, lumbosacral region: Secondary | ICD-10-CM

## 2015-01-06 DIAGNOSIS — M5136 Other intervertebral disc degeneration, lumbar region: Secondary | ICD-10-CM | POA: Diagnosis not present

## 2015-01-06 DIAGNOSIS — M545 Low back pain: Secondary | ICD-10-CM | POA: Diagnosis present

## 2015-01-06 DIAGNOSIS — G8929 Other chronic pain: Secondary | ICD-10-CM | POA: Diagnosis not present

## 2015-01-06 DIAGNOSIS — M479 Spondylosis, unspecified: Secondary | ICD-10-CM | POA: Insufficient documentation

## 2015-01-07 ENCOUNTER — Other Ambulatory Visit: Payer: Self-pay | Admitting: *Deleted

## 2015-01-07 DIAGNOSIS — M5136 Other intervertebral disc degeneration, lumbar region: Secondary | ICD-10-CM

## 2015-01-07 DIAGNOSIS — M47816 Spondylosis without myelopathy or radiculopathy, lumbar region: Secondary | ICD-10-CM

## 2015-01-07 DIAGNOSIS — M48061 Spinal stenosis, lumbar region without neurogenic claudication: Secondary | ICD-10-CM

## 2015-01-12 ENCOUNTER — Encounter: Payer: Self-pay | Admitting: Gastroenterology

## 2015-01-12 ENCOUNTER — Ambulatory Visit (AMBULATORY_SURGERY_CENTER): Payer: BLUE CROSS/BLUE SHIELD | Admitting: Gastroenterology

## 2015-01-12 VITALS — BP 124/75 | HR 57 | Temp 97.1°F | Resp 16 | Ht 69.0 in | Wt 206.0 lb

## 2015-01-12 DIAGNOSIS — R1013 Epigastric pain: Secondary | ICD-10-CM

## 2015-01-12 DIAGNOSIS — K2990 Gastroduodenitis, unspecified, without bleeding: Secondary | ICD-10-CM

## 2015-01-12 DIAGNOSIS — K2970 Gastritis, unspecified, without bleeding: Secondary | ICD-10-CM

## 2015-01-12 MED ORDER — SODIUM CHLORIDE 0.9 % IV SOLN: 500.0000 mL | INTRAVENOUS | Status: DC

## 2015-01-12 NOTE — Op Note (Signed)
[facility]  [address] [place], [address]   ENDOSCOPY PROCEDURE REPORT  PATIENT: Jeffrey Hooper, Jeffrey Hooper  MR#: [id number] BIRTHDATE: 1961/06/02 , 16  yrs. old GENDER: male ENDOSCOPIST: Milus Banister, MD REFERRED BY:  Redge Gainer, M.D. PROCEDURE DATE:  01/12/2015 PROCEDURE:  EGD w/ biopsy ASA CLASS:     Class II INDICATIONS:  left sided abd pain for several weeks, hemocult + stool. MEDICATIONS: Monitored anesthesia care and Propofol 180 mg IV TOPICAL ANESTHETIC: none  DESCRIPTION OF PROCEDURE: After the risks benefits and alternatives of the procedure were thoroughly explained, informed consent was obtained.  The LB [id number] endoscope was introduced through the mouth and advanced to the second portion of the duodenum , Without limitations.  The instrument was slowly withdrawn as the mucosa was fully examined.  There was mild to moderate pan-gastritis, non-specific.  This was biopsied distally and sent to pathology.  The examination was otherwise normal.  Retroflexed views revealed no abnormalities. The scope was then withdrawn from the patient and the procedure completed. COMPLICATIONS: There were no immediate complications.  ENDOSCOPIC IMPRESSION: There was mild to moderate pan-gastritis, non-specific.  This was biopsied distally and sent to pathology.  The examination was otherwise normal  RECOMMENDATIONS: Await pathology results, if no H. pylori then you will likely need further testing (CT scan)   eSigned:  Milus Banister, MD 01/12/2015 10:13 AM

## 2015-01-12 NOTE — Progress Notes (Signed)
A/ox3 pleased with MAC, report to Tracy RN 

## 2015-01-12 NOTE — Patient Instructions (Signed)
Please see procedure report for findings and recommendations  YOU HAD AN ENDOSCOPIC PROCEDURE TODAY AT [facility]:   Refer to the procedure report that was given to you for any specific questions about what was found during the examination.  If the procedure report does not answer your questions, please call your gastroenterologist to clarify.  If you requested that your care partner not be given the details of your procedure findings, then the procedure report has been included in a sealed envelope for you to review at your convenience later.  YOU SHOULD EXPECT: Some feelings of bloating in the abdomen. Passage of more gas than usual.  Walking can help get rid of the air that was put into your GI tract during the procedure and reduce the bloating. If you had a lower endoscopy (such as a colonoscopy or flexible sigmoidoscopy) you may notice spotting of blood in your stool or on the toilet paper. If you underwent a bowel prep for your procedure, you may not have a normal bowel movement for a few days.  Please Note:  You might notice some irritation and congestion in your nose or some drainage.  This is from the oxygen used during your procedure.  There is no need for concern and it should clear up in a day or so.  SYMPTOMS TO REPORT IMMEDIATELY:   Following lower endoscopy (colonoscopy or flexible sigmoidoscopy):  Excessive amounts of blood in the stool  Significant tenderness or worsening of abdominal pains  Swelling of the abdomen that is new, acute  Fever of 100F or higher   Following upper endoscopy (EGD)  Vomiting of blood or coffee ground material  New chest pain or pain under the shoulder blades  Painful or persistently difficult swallowing  New shortness of breath  Fever of 100F or higher  Black, tarry-looking stools  For urgent or emergent issues, a gastroenterologist can be reached at any hour by calling [phone].   DIET: Your first meal following the  procedure should be a small meal and then it is ok to progress to your normal diet. Heavy or fried foods are harder to digest and may make you feel nauseous or bloated.  Likewise, meals heavy in dairy and vegetables can increase bloating.  Drink plenty of fluids but you should avoid alcoholic beverages for 24 hours.  ACTIVITY:  You should plan to take it easy for the rest of today and you should NOT DRIVE or use heavy machinery until tomorrow (because of the sedation medicines used during the test).    FOLLOW UP: Our staff will call the number listed on your records the next business day following your procedure to check on you and address any questions or concerns that you may have regarding the information given to you following your procedure. If we do not reach you, we will leave a message.  However, if you are feeling well and you are not experiencing any problems, there is no need to return our call.  We will assume that you have returned to your regular daily activities without incident.  If any biopsies were taken you will be contacted by phone or by letter within the next 1-3 weeks.  Please call us at [phone] if you have not heard about the biopsies in 3 weeks.    SIGNATURES/CONFIDENTIALITY: You and/or your care partner have signed paperwork which will be entered into your electronic medical record.  These signatures attest to the fact that that the information  above on your After Visit Summary has been reviewed and is understood.  Full responsibility of the confidentiality of this discharge information lies with you and/or your care-partner.  Please follow all discharge instructions given to you by the recovery room nurse. If you have any questions or problems after discharge please call one of the numbers listed above. You will receive a phone call in the am to see how you are doing and answer any questions you may have. Thank you for choosing [facility] for your health  care needs. 

## 2015-01-12 NOTE — Progress Notes (Signed)
Called to room to assist during endoscopic procedure.  Patient ID and intended procedure confirmed with present staff. Received instructions for my participation in the procedure from the performing physician.  

## 2015-01-13 ENCOUNTER — Telehealth: Payer: Self-pay | Admitting: *Deleted

## 2015-01-13 NOTE — Telephone Encounter (Signed)
  Follow up Call-  Call back number 01/12/2015  Post procedure Call Back phone  # [phone]  Permission to leave phone message Yes     Patient questions:  Do you have a fever, pain , or abdominal swelling? No. Pain Score  0 *  Have you tolerated food without any problems? Yes.    Have you been able to return to your normal activities? Yes.    Do you have any questions about your discharge instructions: Diet   No. Medications  No. Follow up visit  No.  Do you have questions or concerns about your Care? No.  Actions: * If pain score is 4 or above: No action needed, pain <4.

## 2015-01-17 ENCOUNTER — Ambulatory Visit (HOSPITAL_COMMUNITY): Payer: BLUE CROSS/BLUE SHIELD

## 2015-02-01 ENCOUNTER — Other Ambulatory Visit: Payer: Self-pay | Admitting: Family Medicine

## 2015-02-01 NOTE — Telephone Encounter (Signed)
Last seen 01/05/15 Jeffrey Hooper  Last lipid 11/13/13

## 2015-02-11 ENCOUNTER — Encounter: Payer: Self-pay | Admitting: Family Medicine

## 2015-02-11 ENCOUNTER — Ambulatory Visit (INDEPENDENT_AMBULATORY_CARE_PROVIDER_SITE_OTHER): Payer: BLUE CROSS/BLUE SHIELD | Admitting: Family Medicine

## 2015-02-11 VITALS — BP 131/83 | HR 74 | Temp 97.1°F | Ht 69.0 in | Wt 210.0 lb

## 2015-02-11 DIAGNOSIS — M5136 Other intervertebral disc degeneration, lumbar region: Secondary | ICD-10-CM

## 2015-02-11 DIAGNOSIS — M5442 Lumbago with sciatica, left side: Secondary | ICD-10-CM

## 2015-02-11 DIAGNOSIS — B351 Tinea unguium: Secondary | ICD-10-CM

## 2015-02-11 DIAGNOSIS — R1013 Epigastric pain: Secondary | ICD-10-CM | POA: Diagnosis not present

## 2015-02-11 NOTE — Progress Notes (Signed)
Subjective:    Patient ID: Jeffrey Hooper, male    DOB: 1961-05-30, 54 y.o.   MRN: [id number]  HPI Patient here today for 1 month follow up on hernia and back pain. He has seen specialists and is still feeling the same. Comes for a recheck following about 1 month. He has seen the gastroenterologist and has had an endoscopy which basically showed gastritis and generalized inflammation in the lining of the stomach. According to the note by the gastroenterologist he was supposed to have a CT scan of the abdomen and pelvis with and without contrast. He has not had this done yet. He was not aware that he was supposed to have this done. He is also seen the neurosurgeon because of the MRI of his LS spine showing multilevel degenerative disease and facet inflammation. Physical therapy was recommended. He continues to have back pain with radiation to his left lateral. He mentioned that the neurosurgeon said something about injections may help.      Patient Active Problem List   Diagnosis Date Noted  . Low back strain 11/13/2013  . Seasonal allergic rhinitis 11/13/2013  . Annual physical exam 08/07/2013  . Multiple benign nevi   . Need for prophylactic vaccination and inoculation against influenza 04/28/2013  . Hyperlipidemia   . Tinnitus of both ears 03/06/2013  . Other and unspecified hyperlipidemia 10/29/2012  . Unspecified hypothyroidism 10/29/2012  . GERD (gastroesophageal reflux disease) 10/29/2012  . Prostatitis, chronic 10/29/2012  . Chest pain 11/28/2011  . Dizziness 11/28/2011  . HYPERTENSION 06/11/2008  . PULMONARY NODULE 06/11/2008   Outpatient Encounter Prescriptions as of 02/11/2015  Medication Sig  . amLODipine (NORVASC) 10 MG tablet TAKE ONE TABLET BY MOUTH ONE TIME DAILY  . aspirin 81 MG tablet Take 81 mg by mouth daily.  [name] atorvastatin (LIPITOR) 40 MG tablet TAKE ONE TABLET BY MOUTH ONE TIME DAILY  . hydrochlorothiazide (HYDRODIURIL) 25 MG tablet TAKE ONE TABLET BY MOUTH  ONE TIME DAILY  . levothyroxine (SYNTHROID, LEVOTHROID) 100 MCG tablet TAKE ONE TABLET BY MOUTH ONE TIME DAILY  . Melatonin 10 MG TABS Take by mouth. 10mg  one time at bedtime  . mometasone (NASONEX) 50 MCG/ACT nasal spray Place 2 sprays into the nose daily.  [name] omeprazole (PRILOSEC) 40 MG capsule TAKE ONE CAPSULE BY MOUTH ONE TIME DAILY  . meloxicam (MOBIC) 15 MG tablet Take 1 tablet (15 mg total) by mouth daily. (Patient not taking: Reported on 02/11/2015)  . [DISCONTINUED] cyclobenzaprine (FLEXERIL) 10 MG tablet Take 1 tablet (10 mg total) by mouth 3 (three) times daily as needed for muscle spasms.   No facility-administered encounter medications on file as of 02/11/2015.      Review of Systems  Constitutional: Negative.   HENT: Negative.   Eyes: Negative.   Respiratory: Negative.   Cardiovascular: Negative.   Gastrointestinal: Positive for abdominal pain (hernia).  Endocrine: Negative.   Genitourinary: Negative.   Musculoskeletal: Positive for back pain.  Skin: Negative.   Allergic/Immunologic: Negative.   Neurological: Negative.   Hematological: Negative.   Psychiatric/Behavioral: Negative.        Objective:   Physical Exam  Constitutional: He is oriented to person, place, and time. He appears well-developed and well-nourished.  HENT:  Head: Normocephalic and atraumatic.  Eyes: Conjunctivae and EOM are normal. Pupils are equal, round, and reactive to light. Right eye exhibits no discharge. Left eye exhibits no discharge. No scleral icterus.  Neck: Normal range of motion.  Cardiovascular: Normal rate, regular rhythm  and normal heart sounds.   No murmur heard. Pulmonary/Chest: Effort normal and breath sounds normal. He has no wheezes. He has no rales.  Abdominal: Soft. Bowel sounds are normal. He exhibits no mass. There is tenderness. There is no rebound and no guarding.  There is slight tenderness in the epigastrium and left upper quadrant. The bowel sounds are normal and there  is no inguinal adenopathy  Musculoskeletal: Normal range of motion. He exhibits no edema.  Neurological: He is alert and oriented to person, place, and time. He has normal reflexes. No cranial nerve deficit.  Skin: Skin is warm and dry. Rash noted. No erythema. No pallor.  The patient has a fungal nail infection on the left great toe with some skin inflammation beneath a.  Psychiatric: He has a normal mood and affect. His behavior is normal. Judgment and thought content normal.  Nursing note and vitals reviewed.  BP 131/83 mmHg  Pulse 74  Temp(Src) 97.1 F (36.2 C) (Oral)  Ht 5\' 9"  (1.753 m)  Wt 210 lb (95.255 kg)  BMI 31.00 kg/m2        Assessment & Plan:  1. Abdominal pain, epigastric -The patient has had an endoscopy which revealed gastritis. He is due to get further studies according to the gastroenterologist and he has not had this done yet. He will call the gastroenterologist and get them to arrange this. In the meantime he should continue with his omeprazole and add Zantac 150 before dinner every night.  2. Lumbar degenerative disc disease -He has seen the neurosurgeon and has been doing physical therapy without relief. He is encouraged to call the neurosurgeon and arrange for injections in his back  3. Left-sided low back pain with left-sided sciatica -Call neurosurgeon and arrange for injections in the spine  4. Nail fungus -Try Lamisil cream and this does not help we'll switch to Lamisil tablets 250 daily for 3 months  Patient Instructions  Call Dr Clarice Pole office - tell them Dr Laurance Flatten wants you to see if [facility] would agree that injections may be a good idea (Dr Maryjean Ka) Call Dr Eugenia Pancoast office and have them go ahead and schedule the CT scan that they reccommended.  We will see you back in a about 8 weeks.    Arrie Senate MD

## 2015-02-11 NOTE — Patient Instructions (Signed)
Call Dr Clarice Pole office - tell them Dr Laurance Flatten wants you to see if [facility] would agree that injections may be a good idea (Dr Maryjean Ka) Call Dr Eugenia Pancoast office and have them go ahead and schedule the CT scan that they reccommended.  We will see you back in a about 8 weeks.

## 2015-03-22 ENCOUNTER — Other Ambulatory Visit: Payer: Self-pay | Admitting: Family Medicine

## 2015-04-08 ENCOUNTER — Ambulatory Visit: Payer: BLUE CROSS/BLUE SHIELD | Admitting: Family Medicine

## 2015-04-21 ENCOUNTER — Telehealth: Payer: Self-pay | Admitting: Gastroenterology

## 2015-04-21 NOTE — Telephone Encounter (Signed)
Jeffrey Hooper, Please call him. Biopsies show no H. Pylori. He needs CT scan abd/pelvis with IV and PO contrast for abd pains.

## 2015-04-21 NOTE — Telephone Encounter (Signed)
Left message on machine to call back  

## 2015-04-25 NOTE — Telephone Encounter (Signed)
Called patient again to schedule ct scan. No answer msg left to contact office

## 2015-04-26 NOTE — Telephone Encounter (Signed)
Patient returned phone call. [facility] # [phone]

## 2015-04-27 ENCOUNTER — Other Ambulatory Visit: Payer: Self-pay

## 2015-04-27 DIAGNOSIS — R1032 Left lower quadrant pain: Secondary | ICD-10-CM

## 2015-04-27 DIAGNOSIS — R195 Other fecal abnormalities: Secondary | ICD-10-CM

## 2015-04-27 NOTE — Telephone Encounter (Signed)
Patient contacted and scheduled for the CT abd/pelvis with contrast at [facility] on 05/05/15 at 2 pm.

## 2015-04-29 ENCOUNTER — Other Ambulatory Visit: Payer: Self-pay | Admitting: Family Medicine

## 2015-05-05 ENCOUNTER — Ambulatory Visit (INDEPENDENT_AMBULATORY_CARE_PROVIDER_SITE_OTHER)
Admission: RE | Admit: 2015-05-05 | Discharge: 2015-05-05 | Disposition: A | Discharge status: Home / Self Care | Payer: BLUE CROSS/BLUE SHIELD | Source: Ambulatory Visit | Attending: Gastroenterology | Admitting: Gastroenterology

## 2015-05-05 DIAGNOSIS — R195 Other fecal abnormalities: Secondary | ICD-10-CM | POA: Diagnosis not present

## 2015-05-05 DIAGNOSIS — R1032 Left lower quadrant pain: Secondary | ICD-10-CM | POA: Diagnosis not present

## 2015-05-05 MED ORDER — IOHEXOL 300 MG/ML  SOLN
100.0000 mL | Freq: Once | INTRAMUSCULAR | Status: AC | PRN
  Administered 2015-05-05: 100 mL via INTRAVENOUS

## 2015-05-12 ENCOUNTER — Encounter: Payer: Self-pay | Admitting: Family Medicine

## 2015-05-12 ENCOUNTER — Ambulatory Visit (INDEPENDENT_AMBULATORY_CARE_PROVIDER_SITE_OTHER): Payer: Worker's Compensation | Admitting: Family Medicine

## 2015-05-12 VITALS — BP 133/80 | HR 72 | Temp 97.0°F | Ht 69.0 in | Wt 211.6 lb

## 2015-05-12 DIAGNOSIS — L237 Allergic contact dermatitis due to plants, except food: Secondary | ICD-10-CM | POA: Diagnosis not present

## 2015-05-12 MED ORDER — PREDNISONE 20 MG PO TABS: ORAL_TABLET | ORAL | Status: DC

## 2015-05-12 MED ORDER — METHYLPREDNISOLONE ACETATE 80 MG/ML IJ SUSP
80.0000 mg | Freq: Once | INTRAMUSCULAR | Status: AC
  Administered 2015-05-12: 80 mg via INTRAMUSCULAR

## 2015-05-12 NOTE — Progress Notes (Signed)
BP 133/80 mmHg  Pulse 72  Temp(Src) 97 F (36.1 C) (Oral)  Ht 5\' 9"  (1.753 m)  Wt 211 lb 9.6 oz (95.981 kg)  BMI 31.23 kg/m2   Subjective:    Patient ID: Jeffrey Hooper, male    DOB: July 23, 1961, 54 y.o.   MRN: [id number]  HPI: Jeffrey Hooper is a 54 y.o. male presenting on 05/12/2015 for Rash   HPI Poison ivy exposure Patient was at work for the town of [place] 2 days ago on 05/10/2015. He was helping pull a tree that had fallen with a chain out of an area and feels that you do occur he was exposed at some time to the poison ivy. He has the rash on patches on both arms neck part of face abdomen and lower legs. He has been very itchy and the rash is been getting worse. 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  Relevant past medical, surgical, family and social history reviewed and updated as indicated. Interim medical history since our last visit reviewed. Allergies and medications reviewed and updated.  Review of Systems  Constitutional: Negative for fever.  HENT: Negative for ear discharge and ear pain.   Eyes: Negative for discharge and visual disturbance.  Respiratory: Negative for shortness of breath and wheezing.   Cardiovascular: Negative for chest pain and leg swelling.  Gastrointestinal: Negative for abdominal pain, diarrhea and constipation.  Genitourinary: Negative for difficulty urinating.  Musculoskeletal: Negative for back pain and gait problem.  Skin: Positive for rash.  Neurological: Negative for syncope, light-headedness and headaches.  All other systems reviewed and are negative.   Per HPI unless specifically indicated above     Medication List       This list is accurate as of: 05/12/15  2:34 PM.  Always use your most recent med list.               amLODipine 10 MG tablet  Commonly known as:  NORVASC  TAKE ONE TABLET BY MOUTH ONE TIME DAILY     aspirin 81 MG tablet  Take 81 mg by mouth daily.     atorvastatin 40 MG tablet  Commonly known as:   LIPITOR  TAKE ONE TABLET BY MOUTH ONE TIME DAILY     diphenhydrAMINE 2 % cream  Commonly known as:  BENADRYL  Apply topically 3 (three) times daily as needed for itching.     diphenhydrAMINE 50 MG capsule  Commonly known as:  BENADRYL  Take 50 mg by mouth every 6 (six) hours as needed.     hydrochlorothiazide 25 MG tablet  Commonly known as:  HYDRODIURIL  TAKE ONE TABLET BY MOUTH ONE TIME DAILY     levothyroxine 100 MCG tablet  Commonly known as:  SYNTHROID, LEVOTHROID  TAKE ONE TABLET BY MOUTH ONE TIME DAILY     Melatonin 10 MG Tabs  Take by mouth. 10mg  one time at bedtime     mometasone 50 MCG/ACT nasal spray  Commonly known as:  NASONEX  Place 2 sprays into the nose daily.     omeprazole 40 MG capsule  Commonly known as:  PRILOSEC  TAKE ONE CAPSULE BY MOUTH ONE TIME DAILY     predniSONE 20 MG tablet  Commonly known as:  DELTASONE  Take 3 tabs daily for 1 week, then 2 tabs daily for week 2, then 1 tab daily for week 3.           Objective:    BP 133/80 mmHg  Pulse 72  Temp(Src) 97 F (36.1 C) (Oral)  Ht 5\' 9"  (1.753 m)  Wt 211 lb 9.6 oz (95.981 kg)  BMI 31.23 kg/m2  Wt Readings from Last 3 Encounters:  05/12/15 211 lb 9.6 oz (95.981 kg)  02/11/15 210 lb (95.255 kg)  01/12/15 206 lb (93.441 kg)    Physical Exam  Constitutional: He is oriented to person, place, and time. He appears well-developed and well-nourished. No distress.  Eyes: Conjunctivae and EOM are normal. Pupils are equal, round, and reactive to light. Right eye exhibits no discharge. No scleral icterus.  Cardiovascular: Normal rate, regular rhythm, normal heart sounds and intact distal pulses.   No murmur heard. Pulmonary/Chest: Effort normal and breath sounds normal. No respiratory distress. He has no wheezes.  Abdominal: He exhibits no distension.  Musculoskeletal: Normal range of motion. He exhibits no edema.  Neurological: He is alert and oriented to person, place, and time. Coordination  normal.  Skin: Skin is warm and dry. Rash (patchy rash interspersed over arms, anterior neck, part of face, lower legs, and abdomen. Small fine papules and excoriations that are pink and raised) noted. He is not diaphoretic.  Psychiatric: He has a normal mood and affect. His behavior is normal.  Vitals reviewed.   Results for orders placed or performed in visit on 12/22/14  Fecal occult blood, imunochemical  Result Value Ref Range   Fecal Occult Bld Negative Negative      Assessment & Plan:   Problem List Items Addressed This Visit    None    Visit Diagnoses    Poison ivy dermatitis    -  Primary    Relevant Medications    methylPREDNISolone acetate (DEPO-MEDROL) injection 80 mg (Start on 05/12/2015  2:45 PM)       We will treat with prednisone, no restrictions needed.  Follow up plan: Return if symptoms worsen or fail to improve.  Caryl Pina, MD [facility] 05/12/2015, 2:34 PM

## 2015-05-16 ENCOUNTER — Other Ambulatory Visit: Payer: Self-pay | Admitting: Family Medicine

## 2015-05-18 ENCOUNTER — Ambulatory Visit (INDEPENDENT_AMBULATORY_CARE_PROVIDER_SITE_OTHER): Payer: BLUE CROSS/BLUE SHIELD | Admitting: Family Medicine

## 2015-05-18 ENCOUNTER — Encounter: Payer: Self-pay | Admitting: Family Medicine

## 2015-05-18 VITALS — BP 128/79 | HR 65 | Temp 97.9°F | Ht 69.0 in | Wt 214.0 lb

## 2015-05-18 DIAGNOSIS — Z23 Encounter for immunization: Secondary | ICD-10-CM

## 2015-05-18 DIAGNOSIS — I10 Essential (primary) hypertension: Secondary | ICD-10-CM | POA: Diagnosis not present

## 2015-05-18 DIAGNOSIS — R7309 Other abnormal glucose: Secondary | ICD-10-CM

## 2015-05-18 DIAGNOSIS — R195 Other fecal abnormalities: Secondary | ICD-10-CM

## 2015-05-18 DIAGNOSIS — R197 Diarrhea, unspecified: Secondary | ICD-10-CM

## 2015-05-18 DIAGNOSIS — R739 Hyperglycemia, unspecified: Secondary | ICD-10-CM

## 2015-05-18 NOTE — Patient Instructions (Addendum)
We will arrange to do some additional blood work He should keep her appointment with the gastroenterologist as planned We will talk to him about any additional testing that we might do to help him and we will try to include this in the lab work that we do on you today Also we would ask you to leave off all caffeine, cold drinks coffee T Mountain Dew use and malleolus We will also ask you leave off all milk cheese ice cream and dairy products We will ask you to by the probiotic, align and take one of these daily Continue to drink plenty of water and fluids return the FOBT We will call with results of the CBC liver function tests and BMP as soon as those results become available For the next couple weeks hold the omeprazole and try Zantac 150 twice daily before breakfast and supper and this can be purchased over-the-counter and can be purchased the cheapest at [facility], the equate brand. If the heartburn is worse with this he could go back on the omeprazole.

## 2015-05-18 NOTE — Addendum Note (Signed)
Addended by: Pollyann Kennedy F on: 05/18/2015 04:14 PM   Modules accepted: Orders

## 2015-05-18 NOTE — Progress Notes (Signed)
Subjective:    Patient ID: Jeffrey Hooper, male    DOB: 04/15/1961, 54 y.o.   MRN: [id number]  HPI   54 year old male comes in today to discuss results of endoscopy and CT scan. These results were reviewed with the patient he was given a copy of them after we've reviewed them. He has a follow-up plan with his gastroenterologist sometime in November. He is still having 5-6 loose bowel movements daily and especially worse in the morning. They are usually watery in consistency and he does not see blood. He cannot associate these with any particular event during the day. He does drink caffeine and he does eat a lot of cheese. He has always done this. The problem with the loose bowel movements however have been going on for 2 or 3-4 months. He does take omeprazole and previously took Nexium.   Patient Active Problem List   Diagnosis Date Noted  . Low back strain 11/13/2013  . Seasonal allergic rhinitis 11/13/2013  . Annual physical exam 08/07/2013  . Multiple benign nevi   . Need for prophylactic vaccination and inoculation against influenza 04/28/2013  . Hyperlipidemia   . Tinnitus of both ears 03/06/2013  . Other and unspecified hyperlipidemia 10/29/2012  . Unspecified hypothyroidism 10/29/2012  . GERD (gastroesophageal reflux disease) 10/29/2012  . Prostatitis, chronic 10/29/2012  . Chest pain 11/28/2011  . Dizziness 11/28/2011  . HYPERTENSION 06/11/2008  . PULMONARY NODULE 06/11/2008   Outpatient Encounter Prescriptions as of 05/18/2015  Medication Sig  . amLODipine (NORVASC) 10 MG tablet TAKE ONE TABLET BY MOUTH ONE TIME DAILY  . aspirin 81 MG tablet Take 81 mg by mouth daily.  [name] atorvastatin (LIPITOR) 40 MG tablet TAKE ONE TABLET BY MOUTH ONE TIME DAILY  . diphenhydrAMINE (BENADRYL) 2 % cream Apply topically 3 (three) times daily as needed for itching.  . diphenhydrAMINE (BENADRYL) 50 MG capsule Take 50 mg by mouth every 6 (six) hours as needed.  . hydrochlorothiazide  (HYDRODIURIL) 25 MG tablet TAKE ONE TABLET BY MOUTH ONE TIME DAILY  . levothyroxine (SYNTHROID, LEVOTHROID) 100 MCG tablet TAKE ONE TABLET BY MOUTH ONE TIME DAILY  . Melatonin 10 MG TABS Take by mouth. 10mg  one time at bedtime  . mometasone (NASONEX) 50 MCG/ACT nasal spray Place 2 sprays into the nose daily.  [name] omeprazole (PRILOSEC) 40 MG capsule TAKE ONE CAPSULE BY MOUTH ONE TIME DAILY  . predniSONE (DELTASONE) 20 MG tablet Take 3 tabs daily for 1 week, then 2 tabs daily for week 2, then 1 tab daily for week 3.   No facility-administered encounter medications on file as of 05/18/2015.      Review of Systems     Objective:   Physical Exam  Constitutional: He is oriented to person, place, and time. He appears well-developed and well-nourished. No distress.  HENT:  Head: Normocephalic.  Eyes: Conjunctivae and EOM are normal. Pupils are equal, round, and reactive to light. Right eye exhibits no discharge. Left eye exhibits no discharge.  Neck: Normal range of motion.  Abdominal: Soft. Bowel sounds are normal. He exhibits no distension and no mass. There is tenderness. There is no rebound and no guarding.  Musculoskeletal: Normal range of motion.  Neurological: He is alert and oriented to person, place, and time.  Skin: Skin is warm and dry. No rash noted.  Psychiatric: He has a normal mood and affect. His behavior is normal. Judgment and thought content normal.  Nursing note and vitals reviewed.  BP 128/79 mmHg  Pulse 65  Temp(Src) 97.9 F (36.6 C) (Oral)  Ht 5\' 9"  (1.753 m)  Wt 214 lb (97.07 kg)  BMI 31.59 kg/m2      Assessment & Plan:   1. Loose bowel movements -Follow recommendations as planned with avoiding caffeine and milk cheese ice cream and dairy products and trying a probiotic over-the-counter.  Patient Instructions  We will arrange to do some additional blood work He should keep her appointment with the gastroenterologist as planned We will talk to him about  any additional testing that we might do to help him and we will try to include this in the lab work that we do on you today Also we would ask you to leave off all caffeine, cold drinks coffee T Mountain Dew use and malleolus We will also ask you leave off all milk cheese ice cream and dairy products We will ask you to by the probiotic, align and take one of these daily Continue to drink plenty of water and fluids return the FOBT We will call with results of the CBC liver function tests and BMP as soon as those results become available For the next couple weeks hold the omeprazole and try Zantac 150 twice daily before breakfast and supper and this can be purchased over-the-counter and can be purchased the cheapest at [facility], the equate brand. If the heartburn is worse with this he could go back on the omeprazole.   Arrie Senate MD

## 2015-05-19 ENCOUNTER — Other Ambulatory Visit: Payer: BLUE CROSS/BLUE SHIELD

## 2015-05-19 ENCOUNTER — Telehealth: Payer: Self-pay

## 2015-05-19 DIAGNOSIS — Z1212 Encounter for screening for malignant neoplasm of rectum: Secondary | ICD-10-CM

## 2015-05-19 LAB — CBC WITH DIFFERENTIAL/PLATELET
BASOS ABS: 0 10*3/uL (ref 0.0–0.2)
Basos: 0 %
EOS (ABSOLUTE): 0 10*3/uL (ref 0.0–0.4)
Eos: 0 %
HEMOGLOBIN: 15.6 g/dL (ref 12.6–17.7)
Hematocrit: 45.9 % (ref 37.5–51.0)
Immature Grans (Abs): 0.1 10*3/uL (ref 0.0–0.1)
Immature Granulocytes: 1 %
LYMPHS ABS: 1.1 10*3/uL (ref 0.7–3.1)
Lymphs: 8 %
MCH: 30.9 pg (ref 26.6–33.0)
MCHC: 34 g/dL (ref 31.5–35.7)
MCV: 91 fL (ref 79–97)
MONOCYTES: 4 %
MONOS ABS: 0.5 10*3/uL (ref 0.1–0.9)
NEUTROS ABS: 12 10*3/uL — AB (ref 1.4–7.0)
Neutrophils: 87 %
Platelets: 323 10*3/uL (ref 150–379)
RBC: 5.05 x10E6/uL (ref 4.14–5.80)
RDW: 13.3 % (ref 12.3–15.4)
WBC: 13.8 10*3/uL — ABNORMAL HIGH (ref 3.4–10.8)

## 2015-05-19 LAB — HEPATIC FUNCTION PANEL
ALK PHOS: 97 IU/L (ref 39–117)
ALT: 28 IU/L (ref 0–44)
AST: 15 IU/L (ref 0–40)
Albumin: 4.6 g/dL (ref 3.5–5.5)
BILIRUBIN TOTAL: 0.5 mg/dL (ref 0.0–1.2)
BILIRUBIN, DIRECT: 0.16 mg/dL (ref 0.00–0.40)
Total Protein: 6.6 g/dL (ref 6.0–8.5)

## 2015-05-19 LAB — BMP8+EGFR
BUN/Creatinine Ratio: 24 — ABNORMAL HIGH (ref 9–20)
BUN: 22 mg/dL (ref 6–24)
CALCIUM: 9.6 mg/dL (ref 8.7–10.2)
CHLORIDE: 95 mmol/L — AB (ref 97–108)
CO2: 25 mmol/L (ref 18–29)
CREATININE: 0.93 mg/dL (ref 0.76–1.27)
GFR, EST AFRICAN AMERICAN: 107 mL/min/{1.73_m2} (ref >59)
GFR, EST NON AFRICAN AMERICAN: 93 mL/min/{1.73_m2} (ref >59)
Glucose: 129 mg/dL — ABNORMAL HIGH (ref 65–99)
Potassium: 4.1 mmol/L (ref 3.5–5.2)
Sodium: 139 mmol/L (ref 134–144)

## 2015-05-19 NOTE — Addendum Note (Signed)
Addended by: Wardell Heath on: 05/19/2015 03:42 PM   Modules accepted: Orders

## 2015-05-19 NOTE — Progress Notes (Signed)
Lab only 

## 2015-05-19 NOTE — Telephone Encounter (Signed)
Pt has been scheduled for 05/31/15 at 245 pm.  He is aware and will call back if that will not work

## 2015-05-19 NOTE — Telephone Encounter (Signed)
-----   Message from Milus Banister, MD sent at 05/19/2015  9:10 AM EDT ----- I spoke with Dr. Laurance Flatten this morning.  He is going to add celiac testing. Will also advise the patient to take one imodium at bedtime and another in AM on scheduled basis  Tajuan Dufault, Can you see about sooner apt than end of November (double book in 2-3 weeks if needed with me).  Thanks   ----- Message -----    From: Ilean China, RN    Sent: 05/18/2015  11:38 AM      To: Milus Banister, MD  Dr Laurance Flatten is seeing this patient today and he has a follow up scheduled with you in November. He continues to have loose bowel movements. Dr Laurance Flatten wants to know if you have any recommendations for labs that we can draw while he is here today. He remembers reading in a note that you may want testing for gluten sensitivity but is having trouble finding it. If you would like this done can you tell me which test it is?

## 2015-05-22 LAB — FECAL OCCULT BLOOD, IMMUNOCHEMICAL: FECAL OCCULT BLD: NEGATIVE

## 2015-05-25 LAB — ANTIGLIADIN IGG (NATIVE): ANTIGLIADIN IGG (NATIVE): 1 U (ref 0–19)

## 2015-05-25 LAB — SPECIMEN STATUS REPORT

## 2015-05-25 LAB — HGB A1C W/O EAG: HEMOGLOBIN A1C: 5.8 % — AB (ref 4.8–5.6)

## 2015-05-25 LAB — GLUTEN SENSITIVITY SCREEN: tTG/DGP Screen: NEGATIVE

## 2015-05-25 LAB — F004-IGE WHEAT: F004W-IGE WHEAT: 0.11 kU/L — AB

## 2015-05-31 ENCOUNTER — Ambulatory Visit (INDEPENDENT_AMBULATORY_CARE_PROVIDER_SITE_OTHER): Payer: BLUE CROSS/BLUE SHIELD | Admitting: Gastroenterology

## 2015-05-31 ENCOUNTER — Encounter: Payer: Self-pay | Admitting: Gastroenterology

## 2015-05-31 VITALS — BP 130/60 | HR 75 | Ht 69.5 in | Wt 206.4 lb

## 2015-05-31 DIAGNOSIS — R109 Unspecified abdominal pain: Secondary | ICD-10-CM

## 2015-05-31 DIAGNOSIS — R197 Diarrhea, unspecified: Secondary | ICD-10-CM | POA: Diagnosis not present

## 2015-05-31 MED ORDER — NA SULFATE-K SULFATE-MG SULF 17.5-3.13-1.6 GM/177ML PO SOLN
1.0000 | Freq: Once | ORAL | Status: DC
Start: 2015-05-31 — End: 2015-07-18

## 2015-05-31 NOTE — Progress Notes (Signed)
Review of pertinent gastrointestinal problems: 1. Epigastric pain 01/2015: EGD Dr. Ardis Hughs 01/2015 mild to moderate gastritis. H.pylori negative.  Follow up CT scan 04/2015 was normal.  HPI: This is a  very pleasant 54 year old man whom I last saw about 4 months ago the time of an upper endoscopy. He had some nonspecific gastritis that was H. pylori negative. He underwent CAT scan last month and it was normal. This is to workup her left upper quadrant abdominal discomfort. He has also been having a lot of loose stools.  Chief complaint is left upper quadrant pain, loose stools  Still has left upper quadrant pressure like discomfort.  Has awoken him at night.  Not a severe pains.  He's had more loosening of his stools, was going 4-5 times per day.  Prior to this he would have 1-2 sold stoolds.    He started one imodium once daily about 2 weeks ago and has noticed improvement in his bowels.  Labs 05/2015: CBD with slightly elevated WBC, cmet normal.  Overall his weight has been stable.  He is most bothered by left sided abd discomfort.  Last colonoscopy was 4-5 years ago, done at [facility].  These problems all started 4 months ago.  Past Medical History  Diagnosis Date  . Hypertension   . Pulmonary nodule   . Hypothyroid   . GERD (gastroesophageal reflux disease)   . Prostatism   . Seasonal allergies   . Hyperlipidemia   . Multiple benign nevi   . Hiatal hernia     Past Surgical History  Procedure Laterality Date  . Back surgery      T12 - L1  . Spinal fusion      C6-7  . Tonsillectomy    . Vasectomy    . Sinus signs    . Radial nerve    . Mass excision  06/25/2012    Procedure: EXCISION MASS;  Surgeon: Wynonia Sours, MD;  Location: [facility];  Service: Orthopedics;  Laterality: Left;  transection of NEUROMA, BURYING RADIAL NERVE IN BRACHIORADIALIS LEFT SIDE    Current Outpatient Prescriptions  Medication Sig Dispense Refill  . amLODipine (NORVASC) 10 MG  tablet TAKE ONE TABLET BY MOUTH ONE TIME DAILY 90 tablet 1  . aspirin 81 MG tablet Take 81 mg by mouth daily.    [name] atorvastatin (LIPITOR) 40 MG tablet TAKE ONE TABLET BY MOUTH ONE TIME DAILY 90 tablet 1  . hydrochlorothiazide (HYDRODIURIL) 25 MG tablet TAKE ONE TABLET BY MOUTH ONE TIME DAILY 90 tablet 1  . levothyroxine (SYNTHROID, LEVOTHROID) 100 MCG tablet TAKE ONE TABLET BY MOUTH ONE TIME DAILY 90 tablet 0  . Melatonin 10 MG TABS Take by mouth. 10mg  one time at bedtime    . mometasone (NASONEX) 50 MCG/ACT nasal spray Place 2 sprays into the nose daily. 17 g 3  . omeprazole (PRILOSEC) 40 MG capsule TAKE ONE CAPSULE BY MOUTH ONE TIME DAILY 90 capsule 0  . predniSONE (DELTASONE) 20 MG tablet Take 3 tabs daily for 1 week, then 2 tabs daily for week 2, then 1 tab daily for week 3. 42 tablet 0   No current facility-administered medications for this visit.    Allergies as of 05/31/2015 - Review Complete 05/31/2015  Allergen Reaction Noted  . Prednisone  03/30/2013  . Doxycycline hyclate    . Fentanyl  10/13/2012  . Penicillins  11/28/2011    Family History  Problem Relation Age of Onset  . Heart attack Father 103  Died with MI  . Heart failure Mother 31    Died age 54  . Colon cancer Neg Hx   . Congestive Heart Failure Mother     Social History   Social History  . Marital Status: Married    Spouse Name: N/A  . Number of Children: 3  . Years of Education: N/A   Occupational History  . [employer] [place] History Main Topics  . Smoking status: Former Smoker -- 20 years    Types: Cigarettes    Quit date: 11/28/2006  . Smokeless tobacco: Never Used  . Alcohol Use: 0.0 oz/week    0 Standard drinks or equivalent per week     Comment: rarely  . Drug Use: No  . Sexual Activity: Not on file   Other Topics Concern  . Not on file   Social History Narrative   Lives at home with wife, new 50 year old adopted boy.       Physical Exam: BP 130/60 mmHg   Pulse 75  Ht 5' 9.5" (1.765 m)  Wt 206 lb 6.4 oz (93.622 kg)  BMI 30.05 kg/m2 Constitutional: generally well-appearing Psychiatric: alert and oriented x3 Abdomen: soft, nontender, nondistended, no obvious ascites, no peritoneal signs, normal bowel sounds   Assessment and plan: 54 y.o. male with left upper quadrant abdominal discomfort, diarrhea, change in bowels.  His diarrhea is deathly improved since he started taking Imodium on a daily basis. I cannot explain his left upper quadrant pain or his loose stools and I recommended that we proceed with colonoscopy at his soonest convenience. I see no reason for any further blood tests or imaging studies prior to then.   Owens Loffler, MD [facility] 05/31/2015, 3:21 PM

## 2015-05-31 NOTE — Patient Instructions (Signed)
You will be set up for a colonoscopy for the left sided abdominal pains and diarrhea.

## 2015-06-06 ENCOUNTER — Telehealth: Payer: Self-pay | Admitting: Gastroenterology

## 2015-06-06 ENCOUNTER — Other Ambulatory Visit: Payer: Self-pay

## 2015-06-06 DIAGNOSIS — R1084 Generalized abdominal pain: Secondary | ICD-10-CM

## 2015-06-06 NOTE — Telephone Encounter (Signed)
Spoke with pt and he will come for additional lab work per Dr. Ardis Hughs.

## 2015-06-10 ENCOUNTER — Other Ambulatory Visit: Payer: BLUE CROSS/BLUE SHIELD

## 2015-06-10 DIAGNOSIS — R1084 Generalized abdominal pain: Secondary | ICD-10-CM

## 2015-06-13 LAB — GLIADIN IGA+TTG IGA
ANTIGLIADIN ABS, IGA: 7 U (ref 0–19)
Transglutaminase IgA: <2 U/mL (ref 0–3)

## 2015-06-13 LAB — GLIA (IGA/G) + TTG IGA
GLIADIN IGA: 5 U (ref <20)
GLIADIN IGG: 2 U (ref <20)
TISSUE TRANSGLUTAMINASE AB, IGA: 1 U/mL (ref <4)

## 2015-06-23 ENCOUNTER — Other Ambulatory Visit: Payer: Self-pay | Admitting: Family Medicine

## 2015-07-05 ENCOUNTER — Ambulatory Visit: Payer: Self-pay | Admitting: Gastroenterology

## 2015-07-15 ENCOUNTER — Encounter: Payer: Self-pay | Admitting: Gastroenterology

## 2015-07-18 ENCOUNTER — Ambulatory Visit (INDEPENDENT_AMBULATORY_CARE_PROVIDER_SITE_OTHER): Payer: BLUE CROSS/BLUE SHIELD | Admitting: Pediatrics

## 2015-07-18 ENCOUNTER — Encounter: Payer: Self-pay | Admitting: Pediatrics

## 2015-07-18 VITALS — BP 125/78 | HR 74 | Temp 97.3°F | Ht 69.5 in | Wt 214.4 lb

## 2015-07-18 DIAGNOSIS — J309 Allergic rhinitis, unspecified: Secondary | ICD-10-CM | POA: Diagnosis not present

## 2015-07-18 DIAGNOSIS — R05 Cough: Secondary | ICD-10-CM | POA: Diagnosis not present

## 2015-07-18 DIAGNOSIS — J069 Acute upper respiratory infection, unspecified: Secondary | ICD-10-CM | POA: Diagnosis not present

## 2015-07-18 DIAGNOSIS — J302 Other seasonal allergic rhinitis: Secondary | ICD-10-CM | POA: Diagnosis not present

## 2015-07-18 DIAGNOSIS — R059 Cough, unspecified: Secondary | ICD-10-CM

## 2015-07-18 MED ORDER — CETIRIZINE HCL 10 MG PO TABS: 10.0000 mg | ORAL_TABLET | Freq: Every day | ORAL | Status: DC

## 2015-07-18 MED ORDER — GUAIFENESIN-CODEINE 100-10 MG/5ML PO SOLN
5.0000 mL | Freq: Every evening | ORAL | Status: DC | PRN
Start: 2015-07-18 — End: 2015-08-02

## 2015-07-18 MED ORDER — MOMETASONE FUROATE 50 MCG/ACT NA SUSP: 2.0000 | Freq: Every day | NASAL | Status: DC

## 2015-07-18 MED ORDER — FLUTICASONE PROPIONATE 50 MCG/ACT NA SUSP: 2.0000 | Freq: Every day | NASAL | Status: DC

## 2015-07-18 NOTE — Patient Instructions (Signed)
Flonase 2 sprays every day  Cetirizine 10mg  every day  Keep water and lozenges with you to prevent the cough  Use cough syrup at night to help with cough

## 2015-07-18 NOTE — Progress Notes (Signed)
Subjective:    Patient ID: Jeffrey Hooper, male    DOB: 1961-05-13, 54 y.o.   MRN: [id number]  CC: Cough and Tinnitus   HPI: Jeffrey Hooper is a 54 y.o. male presenting for Cough and Tinnitus  Night time wake up coughing in th emiddle of the night Has had some congestion off and on Feels like a cough Sometimes has pressure pressure in his ears Started 3 weeks ago with slight fever, congestion, now feeling better but still coughing Has taken flonase in the past, not sure if it helps or not   Depression screen [facility] 2/9 07/18/2015 05/12/2015 09/06/2014 06/10/2014 08/07/2013  Decreased Interest 0 0 0 0 0  Down, Depressed, Hopeless 0 0 0 0 0  PHQ - 2 Score 0 0 0 0 0     Relevant past medical, surgical, family and social history reviewed and updated as indicated. Interim medical history since our last visit reviewed. Allergies and medications reviewed and updated.    ROS: Per HPI unless specifically indicated above  History  Smoking status  . Former Smoker -- 20 years  . Types: Cigarettes  . Quit date: 11/28/2006  Smokeless tobacco  . Never Used    Past Medical History Patient Active Problem List   Diagnosis Date Noted  . Low back strain 11/13/2013  . Seasonal allergic rhinitis 11/13/2013  . Annual physical exam 08/07/2013  . Multiple benign nevi   . Need for prophylactic vaccination and inoculation against influenza 04/28/2013  . Hyperlipidemia   . Tinnitus of both ears 03/06/2013  . Other and unspecified hyperlipidemia 10/29/2012  . Unspecified hypothyroidism 10/29/2012  . GERD (gastroesophageal reflux disease) 10/29/2012  . Prostatitis, chronic 10/29/2012  . Chest pain 11/28/2011  . Dizziness 11/28/2011  . HYPERTENSION 06/11/2008  . PULMONARY NODULE 06/11/2008    Current Outpatient Prescriptions  Medication Sig Dispense Refill  . amLODipine (NORVASC) 10 MG tablet TAKE ONE TABLET BY MOUTH ONE TIME DAILY 90 tablet 1  . aspirin 81 MG tablet Take 81 mg by mouth  daily.    [name] atorvastatin (LIPITOR) 40 MG tablet TAKE ONE TABLET BY MOUTH ONE TIME DAILY 90 tablet 1  . hydrochlorothiazide (HYDRODIURIL) 25 MG tablet TAKE ONE TABLET BY MOUTH ONE TIME DAILY 90 tablet 1  . levothyroxine (SYNTHROID, LEVOTHROID) 100 MCG tablet TAKE ONE TABLET BY MOUTH ONE TIME DAILY 90 tablet 0  . Melatonin 10 MG TABS Take by mouth. 10mg  one time at bedtime    . mometasone (NASONEX) 50 MCG/ACT nasal spray Place 2 sprays into the nose daily. 17 g 3  . omeprazole (PRILOSEC) 40 MG capsule TAKE ONE CAPSULE BY MOUTH ONE TIME DAILY 90 capsule 0  . cetirizine (ZYRTEC) 10 MG tablet Take 1 tablet (10 mg total) by mouth daily. 30 tablet 11  . fluticasone (FLONASE) 50 MCG/ACT nasal spray Place 2 sprays into both nostrils daily. 16 g 6  . guaiFENesin-codeine 100-10 MG/5ML syrup Take 5 mLs by mouth at bedtime as needed for cough. 180 mL 0   No current facility-administered medications for this visit.       Objective:    BP 125/78 mmHg  Pulse 74  Temp(Src) 97.3 F (36.3 C) (Oral)  Ht 5' 9.5" (1.765 m)  Wt 214 lb 6.4 oz (97.251 kg)  BMI 31.22 kg/m2  Wt Readings from Last 3 Encounters:  07/18/15 214 lb 6.4 oz (97.251 kg)  05/31/15 206 lb 6.4 oz (93.622 kg)  05/18/15 214 lb (97.07 kg)  Gen: NAD, alert, cooperative with exam, NCAT EYES: EOMI, no scleral injection or icterus ENT:  TMs dull gray b/l, some clear fluid behind L TM, OP without erythema LYMPH: no cervical LAD CV: NRRR, normal S1/S2, no murmur, distal pulses 2+ b/l Resp: CTABL, no wheezes, normal WOB Abd: +BS, soft, NTND. no guarding or organomegaly Ext: No edema, warm Neuro: Alert and oriented, strength equal b/l UE and LE, coordination grossly normal MSK: normal muscle bulk     Assessment & Plan:    Jeffrey Hooper was seen today for cough and tinnitus, likely due to allergies vs post-viral cough. No fevers. Non-focal exam. Nasal steroids, zyrtec daily, cough syrup with codeine as needed at night, do not drive after  taking the medicine. Use either flonase or nasonex, whichever is affordable. If not improving will let me know.  Diagnoses and all orders for this visit:  Allergic rhinitis, unspecified allergic rhinitis type -     guaiFENesin-codeine 100-10 MG/5ML syrup; Take 5 mLs by mouth at bedtime as needed for cough. -     cetirizine (ZYRTEC) 10 MG tablet; Take 1 tablet (10 mg total) by mouth daily. -     fluticasone (FLONASE) 50 MCG/ACT nasal spray; Place 2 sprays into both nostrils daily.  Seasonal allergic rhinitis -     mometasone (NASONEX) 50 MCG/ACT nasal spray; Place 2 sprays into the nose daily.     Follow up plan: Return if symptoms worsen or fail to improve.  Assunta Found, MD [facility] 07/18/2015, 10:41 AM

## 2015-07-27 ENCOUNTER — Other Ambulatory Visit: Payer: Self-pay | Admitting: Family Medicine

## 2015-08-01 ENCOUNTER — Other Ambulatory Visit: Payer: Self-pay | Admitting: Family

## 2015-08-02 ENCOUNTER — Other Ambulatory Visit: Payer: Self-pay | Admitting: Pediatrics

## 2015-08-02 DIAGNOSIS — J309 Allergic rhinitis, unspecified: Secondary | ICD-10-CM

## 2015-08-02 MED ORDER — GUAIFENESIN-CODEINE 100-10 MG/5ML PO SOLN: 5.0000 mL | Freq: Every evening | ORAL | Status: DC | PRN

## 2015-08-02 NOTE — Telephone Encounter (Signed)
Cough med rx ready for pick up

## 2015-08-02 NOTE — Telephone Encounter (Signed)
Patient aware rx is ready to be picked up 

## 2015-08-17 ENCOUNTER — Ambulatory Visit (INDEPENDENT_AMBULATORY_CARE_PROVIDER_SITE_OTHER): Payer: BLUE CROSS/BLUE SHIELD | Admitting: Family Medicine

## 2015-08-17 ENCOUNTER — Encounter: Payer: Self-pay | Admitting: Family Medicine

## 2015-08-17 VITALS — BP 147/86 | HR 76 | Temp 97.0°F | Ht 69.5 in | Wt 217.0 lb

## 2015-08-17 DIAGNOSIS — R002 Palpitations: Secondary | ICD-10-CM | POA: Diagnosis not present

## 2015-08-17 DIAGNOSIS — I491 Atrial premature depolarization: Secondary | ICD-10-CM

## 2015-08-17 NOTE — Progress Notes (Signed)
BP 147/86 mmHg  Pulse 76  Temp(Src) 97 F (36.1 C) (Oral)  Ht 5' 9.5" (1.765 m)  Wt 217 lb (98.431 kg)  BMI 31.60 kg/m2   Subjective:    Patient ID: Jeffrey Hooper, male    DOB: 12-27-1960, 55 y.o.   MRN: [id number]  HPI: ALEXZANDER KNIEF is a 55 y.o. male presenting on 08/17/2015 for Palpitations and Edema   HPI Palpitations and swelling Patient has been having palpitations over the past 2 days. The palpitations are worse with activity and have even been occurring at rest and at bedtime. He denies any chest pain except for when they start racing really fast but denies that sensation currently. He is still having the palpitations intermittently currently as well. He has been having a little bit of swelling around his ankles and his socks recently over the past month. He has not had these palpitations before. He denies any shortness of breath or wheezing or any upper respiratory illness symptoms. He has been having a little bit of lightheadedness when his heart gets racing quickly.  Relevant past medical, surgical, family and social history reviewed and updated as indicated. Interim medical history since our last visit reviewed. Allergies and medications reviewed and updated.  Review of Systems  Constitutional: Negative for fever and chills.  HENT: Negative for congestion, ear discharge and ear pain.   Eyes: Negative for discharge and visual disturbance.  Respiratory: Negative for cough, shortness of breath and wheezing.   Cardiovascular: Positive for palpitations and leg swelling. Negative for chest pain.  Gastrointestinal: Negative for abdominal pain, diarrhea and constipation.  Genitourinary: Negative for difficulty urinating.  Musculoskeletal: Negative for back pain and gait problem.  Skin: Negative for rash.  Neurological: Positive for dizziness and light-headedness. Negative for syncope and headaches.  All other systems reviewed and are negative.   Per HPI unless  specifically indicated above     Medication List       This list is accurate as of: 08/17/15  3:13 PM.  Always use your most recent med list.               amLODipine 10 MG tablet  Commonly known as:  NORVASC  TAKE ONE TABLET BY MOUTH ONE TIME DAILY     aspirin 81 MG tablet  Take 81 mg by mouth daily.     atorvastatin 40 MG tablet  Commonly known as:  LIPITOR  TAKE ONE TABLET BY MOUTH ONE TIME DAILY     hydrochlorothiazide 25 MG tablet  Commonly known as:  HYDRODIURIL  TAKE ONE TABLET BY MOUTH ONE TIME DAILY     levothyroxine 100 MCG tablet  Commonly known as:  SYNTHROID, LEVOTHROID  TAKE ONE TABLET BY MOUTH ONE TIME DAILY     Melatonin 10 MG Tabs  Take by mouth. 10mg  one time at bedtime     mometasone 50 MCG/ACT nasal spray  Commonly known as:  NASONEX  Place 2 sprays into the nose daily.     omeprazole 40 MG capsule  Commonly known as:  PRILOSEC  TAKE ONE CAPSULE BY MOUTH ONE TIME DAILY           Objective:    BP 147/86 mmHg  Pulse 76  Temp(Src) 97 F (36.1 C) (Oral)  Ht 5' 9.5" (1.765 m)  Wt 217 lb (98.431 kg)  BMI 31.60 kg/m2  Wt Readings from Last 3 Encounters:  08/17/15 217 lb (98.431 kg)  07/18/15 214 lb 6.4 oz (97.251 kg)  05/31/15 206 lb 6.4 oz (93.622 kg)    Physical Exam  Constitutional: He is oriented to person, place, and time. He appears well-developed and well-nourished. No distress.  Eyes: Conjunctivae and EOM are normal. Pupils are equal, round, and reactive to light. Right eye exhibits no discharge. No scleral icterus.  Cardiovascular: Normal rate, S1 normal, S2 normal, normal heart sounds, intact distal pulses and normal pulses.  An irregular rhythm present.  No murmur heard. Pulmonary/Chest: Effort normal and breath sounds normal. No respiratory distress. He has no wheezes.  Abdominal: He exhibits no distension.  Musculoskeletal: Normal range of motion. He exhibits no edema.  Neurological: He is alert and oriented to person,  place, and time. Coordination normal.  Skin: Skin is warm and dry. No rash noted. He is not diaphoretic.  Psychiatric: He has a normal mood and affect. His behavior is normal.  Vitals reviewed.   Results for orders placed or performed in visit on 06/10/15  Celiac panel  Result Value Ref Range   Tissue Transglutaminase Ab, IgA 1 <4 U/mL   Gliadin IgG 2 <20 Units   Gliadin IgA 5 <20 Units  Gliadin IgA+tTG IgA  Result Value Ref Range   Antigliadin Abs, IgA 7 0 - 19 units   Transglutaminase IgA <2 0 - 3 U/mL   EKG: EKG shows sinus rhythm with PACs. Case was discussed with Dr. [name]    Assessment & Plan:   Problem List Items Addressed This Visit    None    Visit Diagnoses    Palpitations    -  Primary    Relevant Orders    EKG 12-Lead (Completed)    Holter monitor - 24 hour    Ambulatory referral to Cardiology    PAC (premature atrial contraction)        EKG shows PACs, will do a 24-hour Holter monitor and send him over to cardiology.    Relevant Orders    Holter monitor - 24 hour    Ambulatory referral to Cardiology        Follow up plan: Return if symptoms worsen or fail to improve.  Counseling provided for all of the vaccine components Orders Placed This Encounter  Procedures  . Ambulatory referral to Cardiology  . Holter monitor - 24 hour  . EKG 12-Lead    Caryl Pina, MD [facility] 08/17/2015, 3:13 PM

## 2015-08-18 ENCOUNTER — Other Ambulatory Visit: Payer: Self-pay | Admitting: Family Medicine

## 2015-08-18 NOTE — Telephone Encounter (Signed)
Okay to give a one month supply, but he needs to have this checked before we give any further refills after that. Especially since he has been having palpitations. Caryl Pina, MD [facility] 08/18/2015, 3:58 PM

## 2015-08-18 NOTE — Telephone Encounter (Signed)
Last seen 08/17/15 Dr Dettinger  Last thyroid 06/10/14

## 2015-08-19 ENCOUNTER — Telehealth: Payer: Self-pay | Admitting: Pediatrics

## 2015-08-19 ENCOUNTER — Ambulatory Visit (INDEPENDENT_AMBULATORY_CARE_PROVIDER_SITE_OTHER): Payer: BLUE CROSS/BLUE SHIELD | Admitting: Cardiology

## 2015-08-19 ENCOUNTER — Encounter: Payer: Self-pay | Admitting: Cardiology

## 2015-08-19 VITALS — BP 150/80 | HR 64 | Ht 70.0 in | Wt 213.2 lb

## 2015-08-19 DIAGNOSIS — R002 Palpitations: Secondary | ICD-10-CM

## 2015-08-19 MED ORDER — METOPROLOL TARTRATE 25 MG PO TABS: 25.0000 mg | ORAL_TABLET | Freq: Two times a day (BID) | ORAL | Status: DC

## 2015-08-19 NOTE — Progress Notes (Signed)
Cardiology Office Note   Date:  08/19/2015   ID:  NYKEEM ABDULLAHI, DOB 30-Apr-1961, MRN [id number]  PCP/Requesting:  [name], MD  Cardiologist:   Minus Breeding, MD   No chief complaint on file.     History of Present Illness: Jeffrey Hooper is a 55 y.o. male who presents for evaluation of palpitations. He was in his primary care office recently and EKG was obtained that suggested atrial fibrillation. However, on that day I reviewed the EKG and it was premature atrial contractions against normal sinus rhythm. There was no fibrillation. He did have a Holter monitor placed.  The results of this are not available.  He presents for follow-up. He says his palpitations have been occurring for about 3 days. They have been intermittently. He'll field him for 30 or 40 seconds and he'll go away and come back a few minutes later. It's not like previous symptoms he had in  2013 when I saw him.  He feels somewhat different. He gets a little lightheaded. He cannot bring it on. He has not had any syncope. He's not had any new shortness of breath, PND or orthopnea. He rarely gets some discomfort in his lower chest upper epigastric area that has been unexplained. He does some ditch digging without being able to bring on any of these symptoms.  I did see him in the past. Stress test some years ago. He's otherwise been no significant cardiovascular history.  Past Medical History  Diagnosis Date  . Hypertension   . Pulmonary nodule   . Hypothyroid   . GERD (gastroesophageal reflux disease)   . Prostatism   . Seasonal allergies   . Hyperlipidemia   . Multiple benign nevi   . Hiatal hernia     Past Surgical History  Procedure Laterality Date  . Back surgery      T12 - L1  . Spinal fusion      C6-7  . Tonsillectomy    . Vasectomy    . Sinus signs    . Radial nerve    . Mass excision  06/25/2012    Procedure: EXCISION MASS;  Surgeon: Wynonia Sours, MD;  Location: [place];   Service: Orthopedics;  Laterality: Left;  transection of NEUROMA, BURYING RADIAL NERVE IN BRACHIORADIALIS LEFT SIDE     Current Outpatient Prescriptions  Medication Sig Dispense Refill  . amLODipine (NORVASC) 10 MG tablet TAKE ONE TABLET BY MOUTH ONE TIME DAILY 90 tablet 1  . aspirin 81 MG tablet Take 81 mg by mouth daily.    [name] atorvastatin (LIPITOR) 40 MG tablet TAKE ONE TABLET BY MOUTH ONE TIME DAILY 90 tablet 0  . hydrochlorothiazide (HYDRODIURIL) 25 MG tablet TAKE ONE TABLET BY MOUTH ONE TIME DAILY 90 tablet 1  . levothyroxine (SYNTHROID, LEVOTHROID) 100 MCG tablet TAKE ONE TABLET BY MOUTH ONE  TIME DAILY 30 tablet 0  . Melatonin 10 MG TABS Take by mouth. 10mg  one time at bedtime    . mometasone (NASONEX) 50 MCG/ACT nasal spray Place 2 sprays into the nose daily. 17 g 3  . omeprazole (PRILOSEC) 40 MG capsule TAKE ONE CAPSULE BY MOUTH ONE TIME DAILY 90 capsule 0   No current facility-administered medications for this visit.    Allergies:   Prednisone; Doxycycline hyclate; Fentanyl; and Penicillins    Social History:  The patient  reports that he quit smoking about 8 years ago. His smoking use included Cigarettes. He quit after 20  years of use. He has never used smokeless tobacco. He reports that he drinks alcohol. He reports that he does not use illicit drugs.   Family History:  The patient's  family history includes Congestive Heart Failure in his mother; Heart attack (age of onset: 62) in his father; Heart failure (age of onset: 6) in his mother. There is no history of Colon cancer.    ROS:  Please see the history of present illness.   Otherwise, review of systems are positive for abdominal pain.   All other systems are reviewed and negative.    PHYSICAL EXAM: VS:  BP 150/80 mmHg  Pulse 64  Ht 5\' 10"  (1.778 m)  Wt 213 lb 4 oz (96.73 kg)  BMI 30.60 kg/m2 , BMI Body mass index is 30.6 kg/(m^2). GENERAL:  Well appearing HEENT:  Pupils equal round and reactive, fundi not  visualized, oral mucosa unremarkable NECK:  No jugular venous distention, waveform within normal limits, carotid upstroke brisk and symmetric, no bruits, no thyromegaly LYMPHATICS:  No cervical, inguinal adenopathy LUNGS:  Clear to auscultation bilaterally BACK:  No CVA tenderness CHEST:  Unremarkable HEART:  PMI not displaced or sustained,S1 and S2 within normal limits, no S3, no S4, no clicks, no rubs, no murmurs ABD:  Flat, positive bowel sounds normal in frequency in pitch, no bruits, no rebound, no guarding, no midline pulsatile mass, no hepatomegaly, no splenomegaly EXT:  2 plus pulses throughout, no edema, no cyanosis no clubbing SKIN:  No rashes no nodules NEURO:  Cranial nerves II through XII grossly intact, motor grossly intact throughout PSYCH:  Cognitively intact, oriented to person place and time    EKG:  EKG is not ordered today. The ekg ordered 08/16/14 demonstrated NSR with PACs, no acute ST T wave changes.     Recent Labs: 05/18/2015: ALT 28; BUN 22; Creatinine, Ser 0.93; Platelets 323; Potassium 4.1; Sodium 139    Lipid Panel    Component Value Date/Time   CHOL 127 08/07/2013 1049   CHOL 122 04/28/2013 1505   CHOL 136 10/29/2012 0922   TRIG 83 08/07/2013 1049   TRIG 139 04/28/2013 1505   TRIG 103 10/29/2012 0922   HDL 45 08/07/2013 1049   HDL 44 04/28/2013 1505   HDL 41 10/29/2012 0922   HDL 65 05/01/2012 1033   CHOLHDL 2.8 08/07/2013 1049   LDLCALC 65 08/07/2013 1049   LDLCALC 50 04/28/2013 1505   LDLCALC 74 10/29/2012 0922   LDLCALC 84 05/01/2012 1033      Wt Readings from Last 3 Encounters:  08/19/15 213 lb 4 oz (96.73 kg)  08/17/15 217 lb (98.431 kg)  07/18/15 214 lb 6.4 oz (97.251 kg)      Other studies Reviewed: Additional studies/ records that were reviewed today include: EKG. Review of the above records demonstrates:  Please see elsewhere in the note.     ASSESSMENT AND PLAN:  PALPITATIONS:  I suspect these are related to PACs. I will  review the Holter when available. I plan to treat him empirically with beta blockers.  CHEST PAIN:  He's had some atypical pain. I think it would be prudent to screen him with a POET (Plain Old Exercise Treadmill).  I will let his PCP set this up at [facility]  HTN:    His blood pressure is mildly elevated today but this is unusual.  This will be treated in the context of treating his palpitations.   Current medicines are reviewed at length with the patient today.  The patient does not have concerns regarding medicines.  The following changes have been made:  As above  Labs/ tests ordered today include:  No orders of the defined types were placed in this encounter.     Disposition:   FU with me in 3 months.      Signed, Minus Breeding, MD  08/19/2015 3:07 PM    [facility]

## 2015-08-19 NOTE — Telephone Encounter (Signed)
Results faxed.

## 2015-08-19 NOTE — Patient Instructions (Signed)
Start Metoprolol 25 mg twice a day    Your physician wants you to follow-up in: 3 months with Dr.Hochrein. You will receive a reminder letter in the mail two months in advance. If you don't receive a letter, please call our office to schedule the follow-up appointment.

## 2015-08-25 ENCOUNTER — Encounter: Payer: Self-pay | Admitting: Family Medicine

## 2015-09-12 ENCOUNTER — Telehealth: Payer: Self-pay | Admitting: *Deleted

## 2015-09-12 NOTE — Telephone Encounter (Signed)
Monitor results reviewed by dr hochrein. Results show atrial tach, dr hochrein would like to see the pt in follow up. Left message for pt to call to schedule.

## 2015-09-15 NOTE — Telephone Encounter (Signed)
Returned call. Pt aware of results. Scheduled for open spot for Dr. Rosezella [place] schedule tomorrow.

## 2015-09-15 NOTE — Telephone Encounter (Signed)
Follow up ° ° ° ° ° °Returning a call to the nurse °

## 2015-09-16 ENCOUNTER — Encounter: Payer: Self-pay | Admitting: Family Medicine

## 2015-09-16 ENCOUNTER — Encounter: Payer: Self-pay | Admitting: Cardiology

## 2015-09-16 ENCOUNTER — Ambulatory Visit (INDEPENDENT_AMBULATORY_CARE_PROVIDER_SITE_OTHER): Payer: BLUE CROSS/BLUE SHIELD | Admitting: Cardiology

## 2015-09-16 VITALS — BP 110/78 | HR 50 | Ht 70.0 in | Wt 212.7 lb

## 2015-09-16 DIAGNOSIS — I10 Essential (primary) hypertension: Secondary | ICD-10-CM

## 2015-09-16 DIAGNOSIS — R002 Palpitations: Secondary | ICD-10-CM | POA: Insufficient documentation

## 2015-09-16 MED ORDER — AMLODIPINE BESYLATE 5 MG PO TABS: 5.0000 mg | ORAL_TABLET | Freq: Every day | ORAL | Status: DC

## 2015-09-16 MED ORDER — METOPROLOL TARTRATE 50 MG PO TABS: 50.0000 mg | ORAL_TABLET | Freq: Two times a day (BID) | ORAL | Status: DC

## 2015-09-16 NOTE — Progress Notes (Signed)
Cardiology Office Note   Date:  09/16/2015   ID:  Jeffrey Hooper, DOB 1961/01/22, MRN [id number]  PCP/Requesting:  [name], MD  Cardiologist:   Minus Breeding, MD   Chief Complaint  Patient presents with  . Palpitations      History of Present Illness: Jeffrey Hooper is a 55 y.o. male who presents for evaluation of palpitations. He was in his primary care office recently and EKG was obtained that suggested atrial fibrillation. However, on that day I reviewed the EKG and it was premature atrial contractions against normal sinus rhythm. There was no fibrillation. He did have a Holter monitor placed.  This demonstrated runs of atrial tachycardia. The patients continuing to feel the palpitations every day though slightly better on low-dose beta blocker. He's not had any presyncope or syncope. He was to have a stress test but he has yet to schedule this. This was to explain some mild epigastric discomfort. His wife also reports a cough that is nonproductive happens more when he is lying down.   Past Medical History  Diagnosis Date  . Hypertension   . Pulmonary nodule   . Hypothyroid   . GERD (gastroesophageal reflux disease)   . Prostatism   . Seasonal allergies   . Hyperlipidemia   . Multiple benign nevi   . Hiatal hernia     Past Surgical History  Procedure Laterality Date  . Back surgery      T12 - L1  . Spinal fusion      C6-7  . Tonsillectomy    . Vasectomy    . Sinus signs    . Radial nerve    . Mass excision  06/25/2012    Procedure: EXCISION MASS;  Surgeon: Wynonia Sours, MD;  Location: [place];  Service: Orthopedics;  Laterality: Left;  transection of NEUROMA, BURYING RADIAL NERVE IN BRACHIORADIALIS LEFT SIDE     Current Outpatient Prescriptions  Medication Sig Dispense Refill  . amLODipine (NORVASC) 10 MG tablet TAKE ONE TABLET BY MOUTH ONE TIME DAILY 90 tablet 1  . aspirin 81 MG tablet Take 81 mg by mouth daily.    [name] atorvastatin  (LIPITOR) 40 MG tablet TAKE ONE TABLET BY MOUTH ONE TIME DAILY 90 tablet 0  . hydrochlorothiazide (HYDRODIURIL) 25 MG tablet TAKE ONE TABLET BY MOUTH ONE TIME DAILY 90 tablet 1  . levothyroxine (SYNTHROID, LEVOTHROID) 100 MCG tablet TAKE ONE TABLET BY MOUTH ONE  TIME DAILY 30 tablet 0  . Melatonin 10 MG TABS Take by mouth. 10mg  one time at bedtime    . metoprolol tartrate (LOPRESSOR) 25 MG tablet Take 1 tablet (25 mg total) by mouth 2 (two) times daily. 60 tablet 6  . mometasone (NASONEX) 50 MCG/ACT nasal spray Place 2 sprays into the nose daily. 17 g 3  . omeprazole (PRILOSEC) 40 MG capsule TAKE ONE CAPSULE BY MOUTH ONE TIME DAILY 90 capsule 0   No current facility-administered medications for this visit.    Allergies:   Prednisone; Doxycycline hyclate; Fentanyl; and Penicillins     ROS:  Please see the history of present illness.   Otherwise, review of systems are positive for abdominal pain.   All other systems are reviewed and negative.    PHYSICAL EXAM: VS:  BP 110/78 mmHg  Pulse 50  Ht 5\' 10"  (1.778 m)  Wt 212 lb 11.2 oz (96.48 kg)  BMI 30.52 kg/m2 , BMI Body mass index is 30.52 kg/(m^2). GENERAL:  Well appearing NECK:  No jugular venous distention, waveform within normal limits, carotid upstroke brisk and symmetric, no bruits, no thyromegaly LUNGS:  Clear to auscultation bilaterally BACK:  No CVA tenderness CHEST:  Unremarkable HEART:  PMI not displaced or sustained,S1 and S2 within normal limits, no S3, no S4, no clicks, no rubs, no murmurs ABD:  Flat, positive bowel sounds normal in frequency in pitch, no bruits, no rebound, no guarding, no midline pulsatile mass, no hepatomegaly, no splenomegaly EXT:  2 plus pulses throughout, no edema, no cyanosis no clubbing    EKG:  EKG is not ordered today.    Recent Labs: 05/18/2015: ALT 28; BUN 22; Creatinine, Ser 0.93; Platelets 323; Potassium 4.1; Sodium 139    Lipid Panel    Component Value Date/Time   CHOL 127  08/07/2013 1049   CHOL 122 04/28/2013 1505   CHOL 136 10/29/2012 0922   TRIG 83 08/07/2013 1049   TRIG 139 04/28/2013 1505   TRIG 103 10/29/2012 0922   HDL 45 08/07/2013 1049   HDL 44 04/28/2013 1505   HDL 41 10/29/2012 0922   HDL 65 05/01/2012 1033   CHOLHDL 2.8 08/07/2013 1049   LDLCALC 65 08/07/2013 1049   LDLCALC 50 04/28/2013 1505   LDLCALC 74 10/29/2012 0922   LDLCALC 84 05/01/2012 1033      Wt Readings from Last 3 Encounters:  09/16/15 212 lb 11.2 oz (96.48 kg)  08/19/15 213 lb 4 oz (96.73 kg)  08/17/15 217 lb (98.431 kg)      Other studies Reviewed: Additional studies/ records that were reviewed today include: None    ASSESSMENT AND PLAN:  PALPITATIONS:  He has atrial tachycardia. I will increase his beta blocker 50 mg twice daily. I will reduce his Norvasc as it this much for blood pressure control.  CHEST PAIN:  He's had some atypical pain. I contacted [facility] to schedule a POET (Plain Old Exercise Treadmill)  HTN:    As above   Current medicines are reviewed at length with the patient today.  The patient does not have concerns regarding medicines.  The following changes have been made:  As above  Labs/ tests ordered today include:  No orders of the defined types were placed in this encounter.     Disposition:   FU with me in 2 months.      Signed, Minus Breeding, MD  09/16/2015 8:11 AM    [place]

## 2015-09-16 NOTE — Patient Instructions (Signed)
Medication Instructions:   DECREASE AMLODIPINE TO 5 MG ONCE DAILY= 1/2 OF 10 MG TABLET ONCE DAILY  INCREASE METOPROLOL TO 50 MG TWICE DAILY= 2 OF THE 25 MG TABLETS TWICE DAILY  Follow-Up:  Your physician recommends that you schedule a follow-up appointment in: [place]

## 2015-09-19 ENCOUNTER — Other Ambulatory Visit: Payer: Self-pay | Admitting: *Deleted

## 2015-09-19 DIAGNOSIS — I10 Essential (primary) hypertension: Secondary | ICD-10-CM

## 2015-09-19 NOTE — Progress Notes (Signed)
Ok to perform treadmill at [facility] per Dr.Dettinger as ordered by Dr. [name]

## 2015-09-21 ENCOUNTER — Other Ambulatory Visit: Payer: Self-pay | Admitting: Family Medicine

## 2015-09-21 ENCOUNTER — Other Ambulatory Visit: Payer: Self-pay | Admitting: Pediatrics

## 2015-09-21 DIAGNOSIS — E039 Hypothyroidism, unspecified: Secondary | ICD-10-CM

## 2015-09-21 NOTE — Telephone Encounter (Signed)
Last TSH 06/2014

## 2015-10-03 ENCOUNTER — Telehealth: Payer: Self-pay | Admitting: Gastroenterology

## 2015-10-03 ENCOUNTER — Telehealth: Payer: Self-pay | Admitting: Cardiology

## 2015-10-03 NOTE — Telephone Encounter (Signed)
Jeffrey Hooper is calling because his treadmill is the day after his appt w/ Dr. [name] and is asking is that okay, or should he have the treadmill test before the appt . Please Call.  Thanks

## 2015-10-04 ENCOUNTER — Telehealth: Payer: Self-pay | Admitting: Pediatrics

## 2015-10-04 NOTE — Telephone Encounter (Signed)
Can I reschedule this colon or office visist?

## 2015-10-04 NOTE — Telephone Encounter (Signed)
Ok for 'direct' colnoscopy

## 2015-10-05 ENCOUNTER — Encounter: Payer: Self-pay | Admitting: Family Medicine

## 2015-10-05 ENCOUNTER — Ambulatory Visit (INDEPENDENT_AMBULATORY_CARE_PROVIDER_SITE_OTHER): Payer: BLUE CROSS/BLUE SHIELD | Admitting: Family Medicine

## 2015-10-05 ENCOUNTER — Telehealth: Payer: Self-pay | Admitting: Family Medicine

## 2015-10-05 ENCOUNTER — Ambulatory Visit (INDEPENDENT_AMBULATORY_CARE_PROVIDER_SITE_OTHER): Payer: BLUE CROSS/BLUE SHIELD

## 2015-10-05 VITALS — BP 132/72 | HR 72 | Temp 101.4°F | Ht 70.0 in | Wt 209.2 lb

## 2015-10-05 DIAGNOSIS — R6883 Chills (without fever): Secondary | ICD-10-CM | POA: Diagnosis not present

## 2015-10-05 DIAGNOSIS — R52 Pain, unspecified: Secondary | ICD-10-CM

## 2015-10-05 DIAGNOSIS — R05 Cough: Secondary | ICD-10-CM | POA: Diagnosis not present

## 2015-10-05 DIAGNOSIS — J029 Acute pharyngitis, unspecified: Secondary | ICD-10-CM

## 2015-10-05 DIAGNOSIS — R059 Cough, unspecified: Secondary | ICD-10-CM

## 2015-10-05 LAB — POCT INFLUENZA A/B
INFLUENZA B, POC: NEGATIVE
Influenza A, POC: NEGATIVE

## 2015-10-05 MED ORDER — CEFDINIR 300 MG PO CAPS: 300.0000 mg | ORAL_CAPSULE | Freq: Two times a day (BID) | ORAL | Status: DC

## 2015-10-05 MED ORDER — BENZONATATE 100 MG PO CAPS: 100.0000 mg | ORAL_CAPSULE | Freq: Two times a day (BID) | ORAL | Status: DC | PRN

## 2015-10-05 NOTE — Telephone Encounter (Signed)
Left message on machine to call back  

## 2015-10-05 NOTE — Progress Notes (Signed)
BP 132/72 mmHg  Pulse 72  Temp(Src) 101.4 F (38.6 C) (Oral)  Ht 5\' 10"  (1.778 m)  Wt 209 lb 3.2 oz (94.892 kg)  BMI 30.02 kg/m2   Subjective:    Patient ID: Jeffrey Hooper, male    DOB: 03/03/1961, 55 y.o.   MRN: [id number]  HPI: Jeffrey Hooper is a 55 y.o. male presenting on 10/05/2015 for Chills; Generalized Body Aches; Diarrhea; Nasal & chest congestion; and Short of breath, cough   HPI Fevers body ache chest congestion and cough Patient has been having fevers and body aches and chest congestion and cough that started since yesterday. He had a little bit of diarrhea the day before that. He is also been having postnasal drainage and sinus congestion. His cough is productive of yellow-green sputum. His fevers have been as high as 102. He denies any sick contacts that he knows of. His fever and chest congestion have gotten worse over the past day. He denies any shortness of breath or wheezing.  Relevant past medical, surgical, family and social history reviewed and updated as indicated. Interim medical history since our last visit reviewed. Allergies and medications reviewed and updated.  Review of Systems  Constitutional: Positive for fever and chills.  HENT: Positive for congestion, postnasal drip, rhinorrhea, sinus pressure, sneezing and sore throat. Negative for ear discharge, ear pain and voice change.   Eyes: Negative for pain, discharge, redness and visual disturbance.  Respiratory: Positive for cough. Negative for chest tightness, shortness of breath and wheezing.   Cardiovascular: Negative for chest pain and leg swelling.  Gastrointestinal: Negative for abdominal pain, diarrhea and constipation.  Genitourinary: Negative for difficulty urinating.  Musculoskeletal: Positive for myalgias. Negative for back pain and gait problem.  Skin: Negative for rash.  Neurological: Negative for syncope, light-headedness and headaches.  All other systems reviewed and are  negative.   Per HPI unless specifically indicated above     Medication List       This list is accurate as of: 10/05/15  2:22 PM.  Always use your most recent med list.               amLODipine 5 MG tablet  Commonly known as:  NORVASC  Take 1 tablet (5 mg total) by mouth daily.     aspirin 81 MG tablet  Take 81 mg by mouth daily.     atorvastatin 40 MG tablet  Commonly known as:  LIPITOR  TAKE ONE TABLET BY MOUTH ONE TIME DAILY     hydrochlorothiazide 25 MG tablet  Commonly known as:  HYDRODIURIL  TAKE ONE TABLET BY MOUTH ONE TIME DAILY     levothyroxine 100 MCG tablet  Commonly known as:  SYNTHROID, LEVOTHROID  TAKE ONE TABLET BY MOUTH ONE TIME DAILY     Melatonin 10 MG Tabs  Take by mouth. 10mg  one time at bedtime     metoprolol 50 MG tablet  Commonly known as:  LOPRESSOR  Take 1 tablet (50 mg total) by mouth 2 (two) times daily.     mometasone 50 MCG/ACT nasal spray  Commonly known as:  NASONEX  Place 2 sprays into the nose daily.     omeprazole 40 MG capsule  Commonly known as:  PRILOSEC  TAKE ONE CAPSULE BY MOUTH ONE TIME DAILY           Objective:    BP 132/72 mmHg  Pulse 72  Temp(Src) 101.4 F (38.6 C) (Oral)  Ht 5\' 10"  (1.778  m)  Wt 209 lb 3.2 oz (94.892 kg)  BMI 30.02 kg/m2  Wt Readings from Last 3 Encounters:  10/05/15 209 lb 3.2 oz (94.892 kg)  09/16/15 212 lb 11.2 oz (96.48 kg)  08/19/15 213 lb 4 oz (96.73 kg)    Physical Exam  Constitutional: He is oriented to person, place, and time. He appears well-developed and well-nourished. No distress.  HENT:  Right Ear: Tympanic membrane, external ear and ear canal normal.  Left Ear: Tympanic membrane, external ear and ear canal normal.  Nose: Mucosal edema and rhinorrhea present. No sinus tenderness. No epistaxis. Right sinus exhibits maxillary sinus tenderness. Right sinus exhibits no frontal sinus tenderness. Left sinus exhibits maxillary sinus tenderness. Left sinus exhibits no frontal  sinus tenderness.  Mouth/Throat: Uvula is midline and mucous membranes are normal. Posterior oropharyngeal edema and posterior oropharyngeal erythema present. No oropharyngeal exudate or tonsillar abscesses.  Eyes: Conjunctivae and EOM are normal. Pupils are equal, round, and reactive to light. Right eye exhibits no discharge. No scleral icterus.  Neck: Neck supple. No thyromegaly present.  Cardiovascular: Normal rate, regular rhythm, normal heart sounds and intact distal pulses.   No murmur heard. Pulmonary/Chest: Effort normal and breath sounds normal. No respiratory distress. He has no wheezes.  Musculoskeletal: Normal range of motion. He exhibits no edema.  Lymphadenopathy:    He has no cervical adenopathy.  Neurological: He is alert and oriented to person, place, and time. Coordination normal.  Skin: Skin is warm and dry. No rash noted. He is not diaphoretic.  Psychiatric: He has a normal mood and affect. His behavior is normal.  Nursing note and vitals reviewed.   Results for orders placed or performed in visit on 10/05/15  POCT Influenza A/B  Result Value Ref Range   Influenza A, POC Negative Negative   Influenza B, POC Negative Negative   Chest x-ray: No active cardiopulmonary disease noted on image by me or by read by radiologist. No changes from previous x-ray    Assessment & Plan:   Problem List Items Addressed This Visit    None    Visit Diagnoses    Body aches    -  Primary    Relevant Medications    cefdinir (OMNICEF) 300 MG capsule    Other Relevant Orders    POCT Influenza A/B (Completed)    DG Chest 2 View (Completed)    Acute pharyngitis, unspecified etiology        Relevant Medications    cefdinir (OMNICEF) 300 MG capsule       Follow up plan: Return if symptoms worsen or fail to improve.  Counseling provided for all of the vaccine components Orders Placed This Encounter  Procedures  . DG Chest 2 View  . POCT Influenza A/B    Caryl Pina,  MD [facility] 10/05/2015, 2:22 PM

## 2015-10-05 NOTE — Telephone Encounter (Signed)
Prescription for Jeffrey Hooper sent to pharmacy, patient informed

## 2015-10-06 NOTE — Telephone Encounter (Signed)
Left message for pt to call and reschedule appt with hochrein until after testing is complete

## 2015-10-10 NOTE — Telephone Encounter (Signed)
Left message on machine to call back  

## 2015-10-11 NOTE — Telephone Encounter (Signed)
Unable to reach pt letter mailed to the pt to return call

## 2015-10-18 ENCOUNTER — Telehealth: Payer: Self-pay | Admitting: Pediatrics

## 2015-10-18 ENCOUNTER — Ambulatory Visit (INDEPENDENT_AMBULATORY_CARE_PROVIDER_SITE_OTHER): Payer: BLUE CROSS/BLUE SHIELD | Admitting: Pediatrics

## 2015-10-18 ENCOUNTER — Encounter: Payer: Self-pay | Admitting: Pediatrics

## 2015-10-18 VITALS — BP 124/78 | HR 48 | Temp 98.5°F | Ht 70.0 in | Wt 211.0 lb

## 2015-10-18 DIAGNOSIS — R05 Cough: Secondary | ICD-10-CM

## 2015-10-18 DIAGNOSIS — K219 Gastro-esophageal reflux disease without esophagitis: Secondary | ICD-10-CM

## 2015-10-18 DIAGNOSIS — R059 Cough, unspecified: Secondary | ICD-10-CM

## 2015-10-18 DIAGNOSIS — J189 Pneumonia, unspecified organism: Secondary | ICD-10-CM | POA: Diagnosis not present

## 2015-10-18 DIAGNOSIS — I10 Essential (primary) hypertension: Secondary | ICD-10-CM

## 2015-10-18 DIAGNOSIS — E039 Hypothyroidism, unspecified: Secondary | ICD-10-CM | POA: Diagnosis not present

## 2015-10-18 MED ORDER — HYDROCHLOROTHIAZIDE 25 MG PO TABS: 25.0000 mg | ORAL_TABLET | Freq: Every day | ORAL | Status: DC

## 2015-10-18 MED ORDER — LEVOTHYROXINE SODIUM 100 MCG PO TABS: 100.0000 ug | ORAL_TABLET | Freq: Every day | ORAL | Status: DC

## 2015-10-18 MED ORDER — AZITHROMYCIN 250 MG PO TABS: ORAL_TABLET | ORAL | Status: DC

## 2015-10-18 MED ORDER — BENZONATATE 200 MG PO CAPS: 200.0000 mg | ORAL_CAPSULE | Freq: Two times a day (BID) | ORAL | Status: DC | PRN

## 2015-10-18 MED ORDER — OMEPRAZOLE 40 MG PO CPDR: 40.0000 mg | DELAYED_RELEASE_CAPSULE | Freq: Every day | ORAL | Status: DC

## 2015-10-18 NOTE — Telephone Encounter (Signed)
Patient advised that if he is not feeling any better that he would need to be seen. Patient given an appointment for 5:30pm.

## 2015-10-18 NOTE — Progress Notes (Signed)
    Subjective:    Patient ID: Jeffrey Hooper, male    DOB: 1961-02-09, 55 y.o.   MRN: [id number]  CC: Cough and Facial Pain and med refills  HPI: Jeffrey Hooper is a 55 y.o. male presenting for Cough and Facial Pain  Seen two weeks ago, given cefdinir  Still with vice-like pain in face Had fever for 3-4 days then No fevers since then Remains congested, feels like cough is getting worse  HTN: no HA, vision changes, taking regularly  GER: well controlled with current med   Relevant past medical, surgical, family and social history reviewed and updated as indicated. Interim medical history since our last visit reviewed. Allergies and medications reviewed and updated.    ROS: Per HPI unless specifically indicated above  History  Smoking status  . Former Smoker -- 20 years  . Types: Cigarettes  . Quit date: 11/28/2006  Smokeless tobacco  . Never Used    Past Medical History Patient Active Problem List   Diagnosis Date Noted  . Palpitations 09/16/2015  . Low back strain 11/13/2013  . Seasonal allergic rhinitis 11/13/2013  . Annual physical exam 08/07/2013  . Multiple benign nevi   . Need for prophylactic vaccination and inoculation against influenza 04/28/2013  . Hyperlipidemia   . Tinnitus of both ears 03/06/2013  . Other and unspecified hyperlipidemia 10/29/2012  . Hypothyroidism 10/29/2012  . GERD (gastroesophageal reflux disease) 10/29/2012  . Prostatitis, chronic 10/29/2012  . Chest pain 11/28/2011  . Dizziness 11/28/2011  . Essential hypertension 06/11/2008  . PULMONARY NODULE 06/11/2008       Objective:    BP 124/78 mmHg  Pulse 48  Temp(Src) 98.5 F (36.9 C) (Oral)  Ht 5\' 10"  (1.778 m)  Wt 211 lb (95.709 kg)  BMI 30.28 kg/m2  Wt Readings from Last 3 Encounters:  10/18/15 211 lb (95.709 kg)  10/05/15 209 lb 3.2 oz (94.892 kg)  09/16/15 212 lb 11.2 oz (96.48 kg)     Gen: NAD, alert, cooperative with exam, NCAT EYES: EOMI, no scleral  injection or icterus ENT:  TMs pearly gray b/l, OP without erythema, minimal tenderness over sinuses LYMPH: no cervical LAD CV: NRRR, normal S1/S2, no murmur, distal pulses 2+ b/l Resp: rhonchi RLL, no wheezes, normal WOB Abd: +BS, soft, NTND.  Ext: No edema, warm Neuro: Alert and oriented MSK: normal muscle bulk     Assessment & Plan:    Loren was seen today for cough and facial pain and f/u multiple med problems.  Diagnoses and all orders for this visit:  CAP (community acquired pneumonia) -     azithromycin (ZITHROMAX) 250 MG tablet; Take 2 the first day and then one each day after.  Cough -     benzonatate (TESSALON) 200 MG capsule; Take 1 capsule (200 mg total) by mouth 2 (two) times daily as needed for cough.  Gastroesophageal reflux disease, esophagitis presence not specified -     omeprazole (PRILOSEC) 40 MG capsule; Take 1 capsule (40 mg total) by mouth daily.  Essential hypertension -     hydrochlorothiazide (HYDRODIURIL) 25 MG tablet; Take 1 tablet (25 mg total) by mouth daily.  Hypothyroidism, unspecified hypothyroidism type -     levothyroxine (SYNTHROID, LEVOTHROID) 100 MCG tablet; Take 1 tablet (100 mcg total) by mouth daily.     Follow up plan: Return if symptoms worsen or fail to improve.  Assunta Found, MD [facility] 10/18/2015, 7:29 PM

## 2015-10-20 ENCOUNTER — Other Ambulatory Visit: Payer: Self-pay | Admitting: Pediatrics

## 2015-10-20 ENCOUNTER — Other Ambulatory Visit: Payer: Self-pay | Admitting: Family Medicine

## 2015-10-20 NOTE — Telephone Encounter (Signed)
Patient just received #90 on 3/14

## 2015-10-27 ENCOUNTER — Other Ambulatory Visit: Payer: Self-pay | Admitting: Family Medicine

## 2015-11-01 ENCOUNTER — Ambulatory Visit (AMBULATORY_SURGERY_CENTER): Payer: Self-pay

## 2015-11-01 VITALS — Ht 70.5 in | Wt 210.4 lb

## 2015-11-01 DIAGNOSIS — R197 Diarrhea, unspecified: Secondary | ICD-10-CM

## 2015-11-01 MED ORDER — SUPREP BOWEL PREP KIT 17.5-3.13-1.6 GM/177ML PO SOLN: 1.0000 | Freq: Once | ORAL | Status: DC

## 2015-11-01 NOTE — Progress Notes (Signed)
No allergies to eggs or soy No past problems with anesthesia No diet/weight loss meds No home oxygen  Has email and internet; refused emmi, has had before

## 2015-11-03 ENCOUNTER — Telehealth: Payer: Self-pay | Admitting: Gastroenterology

## 2015-11-07 NOTE — Telephone Encounter (Signed)
Pt returned call.  He received his Suprep sample last Friday.  Angela/PV

## 2015-11-07 NOTE — Telephone Encounter (Signed)
LM for pt to return call.  If no sample received he can take an OTC prep (Miralax).  Levada Dy

## 2015-11-08 ENCOUNTER — Encounter: Payer: Self-pay | Admitting: Gastroenterology

## 2015-11-08 ENCOUNTER — Ambulatory Visit (AMBULATORY_SURGERY_CENTER): Payer: BLUE CROSS/BLUE SHIELD | Admitting: Gastroenterology

## 2015-11-08 VITALS — BP 107/67 | HR 47 | Temp 98.0°F | Resp 12

## 2015-11-08 DIAGNOSIS — R1012 Left upper quadrant pain: Secondary | ICD-10-CM | POA: Diagnosis not present

## 2015-11-08 DIAGNOSIS — R109 Unspecified abdominal pain: Secondary | ICD-10-CM | POA: Diagnosis not present

## 2015-11-08 DIAGNOSIS — R197 Diarrhea, unspecified: Secondary | ICD-10-CM

## 2015-11-08 MED ORDER — SODIUM CHLORIDE 0.9 % IV SOLN: 500.0000 mL | INTRAVENOUS | Status: DC

## 2015-11-08 NOTE — Progress Notes (Signed)
Called to room to assist during endoscopic procedure.  Patient ID and intended procedure confirmed with present staff. Received instructions for my participation in the procedure from the performing physician.  

## 2015-11-08 NOTE — Progress Notes (Signed)
Cardiology Office Note   Date:  11/09/2015   ID:  Jeffrey Hooper, DOB [birth year], MRN [id number]  PCP/Requesting:  Jeffrey Maize, MD  Cardiologist:   Minus Breeding, MD   No chief complaint on file.     History of Present Illness: Jeffrey Hooper is a 55 y.o. male who presents for evaluation of palpitations. He was in his primary care office recently and EKG was obtained that suggested atrial fibrillation. However, on that day I reviewed the EKG and it was premature atrial contractions against normal sinus rhythm. There was no fibrillation. He did have a Holter monitor placed.  This demonstrated runs of atrial tachycardia. The patients continuing to feel the palpitations every day though slightly better on low-dose beta blocker.  I increased the dose of beta blocker.  He has had some bradycardia.  This was noted yesterday when he had a colonoscopy.  However, he patient denies any new symptoms such as chest discomfort, neck or arm discomfort. There has been no new shortness of breath, PND or orthopnea. There has  presyncope or syncope.   Past Medical History  Diagnosis Date  . Hypertension   . Pulmonary nodule   . Hypothyroid   . GERD (gastroesophageal reflux disease)   . Prostatism   . Seasonal allergies   . Hyperlipidemia   . Multiple benign nevi   . Hiatal hernia   . Allergy   . Arthritis     Past Surgical History  Procedure Laterality Date  . Back surgery      T12 - L1  . Spinal fusion      C6-7  . Tonsillectomy    . Vasectomy    . Sinus signs    . Radial nerve    . Mass excision  06/25/2012    Procedure: EXCISION MASS;  Surgeon: Wynonia Sours, MD;  Location: [facility];  Service: Orthopedics;  Laterality: Left;  transection of NEUROMA, BURYING RADIAL NERVE IN BRACHIORADIALIS LEFT SIDE     Current Outpatient Prescriptions  Medication Sig Dispense Refill  . amLODipine (NORVASC) 5 MG tablet Take 1 tablet (5 mg total) by mouth daily. 90 tablet 3    . aspirin 81 MG tablet Take 81 mg by mouth daily.    [name] atorvastatin (LIPITOR) 40 MG tablet TAKE ONE TABLET BY MOUTH ONE TIME DAILY 90 tablet 0  . benzonatate (TESSALON) 100 MG capsule Take 100 mg by mouth 2 (two) times daily as needed for cough.   0  . hydrochlorothiazide (HYDRODIURIL) 25 MG tablet Take 1 tablet (25 mg total) by mouth daily. 90 tablet 1  . levothyroxine (SYNTHROID, LEVOTHROID) 100 MCG tablet Take 1 tablet (100 mcg total) by mouth daily. 90 tablet 1  . Melatonin 10 MG TABS Take by mouth. 10mg  one time at bedtime    . metoprolol tartrate (LOPRESSOR) 50 MG tablet Take 1 tablet (50 mg total) by mouth 2 (two) times daily. 180 tablet 3  . mometasone (NASONEX) 50 MCG/ACT nasal spray Place 2 sprays into the nose daily. 17 g 3  . omeprazole (PRILOSEC) 40 MG capsule Take 1 capsule (40 mg total) by mouth daily. 90 capsule 0   No current facility-administered medications for this visit.    Allergies:   Prednisone; Doxycycline hyclate; Penicillins; and Fentanyl     ROS:  Please see the history of present illness.   Otherwise, review of systems are positive for none.   All other systems are reviewed and negative.  PHYSICAL EXAM: VS:  BP 124/80 mmHg  Pulse 46  Ht 5\' 10"  (1.778 m)  Wt 211 lb (95.709 kg)  BMI 30.28 kg/m2 , BMI Body mass index is 30.28 kg/(m^2). GENERAL:  Well appearing NECK:  No jugular venous distention, waveform within normal limits, carotid upstroke brisk and symmetric, no bruits, no thyromegaly LUNGS:  Clear to auscultation bilaterally BACK:  No CVA tenderness CHEST:  Unremarkable HEART:  PMI not displaced or sustained,S1 and S2 within normal limits, no S3, no S4, no clicks, no rubs, no murmurs ABD:  Flat, positive bowel sounds normal in frequency in pitch, no bruits, no rebound, no guarding, no midline pulsatile mass, no hepatomegaly, no splenomegaly EXT:  2 plus pulses throughout, no edema, no cyanosis no clubbing    EKG:  EKG is not ordered  today.    Recent Labs: 05/18/2015: ALT 28; BUN 22; Creatinine, Ser 0.93; Platelets 323; Potassium 4.1; Sodium 139    Lipid Panel    Component Value Date/Time   CHOL 127 08/07/2013 1049   CHOL 122 04/28/2013 1505   CHOL 136 10/29/2012 0922   TRIG 83 08/07/2013 1049   TRIG 139 04/28/2013 1505   TRIG 103 10/29/2012 0922   HDL 45 08/07/2013 1049   HDL 44 04/28/2013 1505   HDL 41 10/29/2012 0922   HDL 65 05/01/2012 1033   CHOLHDL 2.8 08/07/2013 1049   LDLCALC 65 08/07/2013 1049   LDLCALC 50 04/28/2013 1505   LDLCALC 74 10/29/2012 0922   LDLCALC 84 05/01/2012 1033      Wt Readings from Last 3 Encounters:  11/09/15 211 lb (95.709 kg)  11/01/15 210 lb 6.4 oz (95.437 kg)  10/18/15 211 lb (95.709 kg)      Other studies Reviewed: Additional studies/ records that were reviewed today include: None    ASSESSMENT AND PLAN:   PALPITATIONS:  The patient is going to have a POET (Plain Old Exercise Treadmill) tomorrow.  I will check a BMET and TSH and Mg.  I will order an echo.  For now he does not want to change his medications to an antiarrhythmic. However, he continues to have particularly tachypalpitations I would consider flecainide once I have the echo and stress test.  CHEST PAIN:  He's had some atypical pain. I contacted [facility] to schedule a POET (Plain Old Exercise Treadmill)  HTN:    The blood pressure is at target. No change in medications is indicated. We will continue with therapeutic lifestyle changes (TLC).   Current medicines are reviewed at length with the patient today.  The patient does not have concerns regarding medicines.  The following changes have been made:  As above  Labs/ tests ordered today include:  No orders of the defined types were placed in this encounter.     Disposition:   FU with me in 3 months.      Signed, Minus Breeding, MD  11/09/2015 9:37 AM    [facility]

## 2015-11-08 NOTE — Op Note (Signed)
[facility] Patient Name: Jeffrey Hooper Procedure Date: 11/08/2015 8:11 AM MRN: [id number] Endoscopist: Milus Banister , MD Age: 55 Referring MD:  Date of Birth: [birth year] Gender: Male Procedure:                Colonoscopy Indications:              Abdominal pain in the left upper quadrant,                            Clinically significant diarrhea of unexplained                            origin Medicines:                Monitored Anesthesia Care Procedure:                Pre-Anesthesia Assessment:                           - Prior to the procedure, a History and Physical                            was performed, and patient medications and                            allergies were reviewed. The patient's tolerance of                            previous anesthesia was also reviewed. The risks                            and benefits of the procedure and the sedation                            options and risks were discussed with the patient.                            All questions were answered, and informed consent                            was obtained. Prior Anticoagulants: The patient has                            taken no previous anticoagulant or antiplatelet                            agents. ASA Grade Assessment: II - A patient with                            mild systemic disease. After reviewing the risks                            and benefits, the patient was deemed in  satisfactory condition to undergo the procedure.                           After obtaining informed consent, the colonoscope                            was passed under direct vision. Throughout the                            procedure, the patient's blood pressure, pulse, and                            oxygen saturations were monitored continuously. The                            Model CF-HQ190L [id number]) scope was introduced                            through  the anus and advanced to the the terminal                            ileum. The colonoscopy was performed without                            difficulty. The patient tolerated the procedure                            well. The quality of the bowel preparation was                            excellent. The terminal ileum, ileocecal valve,                            appendiceal orifice, and rectum were photographed. Scope In: 8:19:16 AM Scope Out: 8:26:42 AM Scope Withdrawal Time: 0 hours 6 minutes 7 seconds  Total Procedure Duration: 0 hours 7 minutes 26 seconds  Findings:      The terminal ileum appeared normal.      The colon (entire examined portion) appeared normal. Biopsies for       histology were taken with a cold forceps from the entire colon for       evaluation of microscopic colitis.      The exam was otherwise without abnormality on direct and retroflexion       views. Complications:            No immediate complications. Estimated blood loss:                            None. Estimated Blood Loss:     Estimated blood loss: none. Impression:               - The examined portion of the ileum was normal.                           - The entire examined colon is normal. Biopsied.                           -  The examination was otherwise normal on direct                            and retroflexion views. Recommendation:           - Patient has a contact number available for                            emergencies. The signs and symptoms of potential                            delayed complications were discussed with the                            patient. Return to normal activities tomorrow.                            Written discharge instructions were provided to the                            patient.                           - Resume previous diet.                           - Continue present medications.                           - Repeat colonoscopy in 10 years for  screening                            purposes.                           - Await pathology results. Procedure Code(s):        --- Professional ---                           [phone], Colonoscopy, flexible; with biopsy, single                            or multiple CPT copyright 2016 American Medical Association. All rights reserved. Milus Banister, MD 11/08/2015 8:32:19 AM This report has been signed electronically. Number of Addenda: 0 Referring MD:      Erline Hau

## 2015-11-08 NOTE — Progress Notes (Signed)
Patient awakening,vss,report to rn 

## 2015-11-08 NOTE — Patient Instructions (Signed)
YOU HAD AN ENDOSCOPIC PROCEDURE TODAY AT THE [facility]:   Refer to the procedure report that was given to you for any specific questions about what was found during the examination.  If the procedure report does not answer your questions, please call your gastroenterologist to clarify.  If you requested that your care partner not be given the details of your procedure findings, then the procedure report has been included in a sealed envelope for you to review at your convenience later.  YOU SHOULD EXPECT: Some feelings of bloating in the abdomen. Passage of more gas than usual.  Walking can help get rid of the air that was put into your GI tract during the procedure and reduce the bloating. If you had a lower endoscopy (such as a colonoscopy or flexible sigmoidoscopy) you may notice spotting of blood in your stool or on the toilet paper. If you underwent a bowel prep for your procedure, you may not have a normal bowel movement for a few days.  Please Note:  You might notice some irritation and congestion in your nose or some drainage.  This is from the oxygen used during your procedure.  There is no need for concern and it should clear up in a day or so.  SYMPTOMS TO REPORT IMMEDIATELY:   Following lower endoscopy (colonoscopy or flexible sigmoidoscopy):  Excessive amounts of blood in the stool  Significant tenderness or worsening of abdominal pains  Swelling of the abdomen that is new, acute  Fever of 100F or higher   For urgent or emergent issues, a gastroenterologist can be reached at any hour by calling [phone].   DIET: Your first meal following the procedure should be a small meal and then it is ok to progress to your normal diet. Heavy or fried foods are harder to digest and may make you feel nauseous or bloated.  Likewise, meals heavy in dairy and vegetables can increase bloating.  Drink plenty of fluids but you should avoid alcoholic beverages for 24  hours.  ACTIVITY:  You should plan to take it easy for the rest of today and you should NOT DRIVE or use heavy machinery until tomorrow (because of the sedation medicines used during the test).    FOLLOW UP: Our staff will call the number listed on your records the next business day following your procedure to check on you and address any questions or concerns that you may have regarding the information given to you following your procedure. If we do not reach you, we will leave a message.  However, if you are feeling well and you are not experiencing any problems, there is no need to return our call.  We will assume that you have returned to your regular daily activities without incident.  If any biopsies were taken you will be contacted by phone or by letter within the next 1-3 weeks.  Please call us at [phone] if you have not heard about the biopsies in 3 weeks.    SIGNATURES/CONFIDENTIALITY: You and/or your care partner have signed paperwork which will be entered into your electronic medical record.  These signatures attest to the fact that that the information above on your After Visit Summary has been reviewed and is understood.  Full responsibility of the confidentiality of this discharge information lies with you and/or your care-partner.   Resume medications. 

## 2015-11-08 NOTE — Progress Notes (Signed)
Patient stating his heart rate has been low since last [employer]op[employer] increase in Feb. Denies dizziness, or any symptoms of decrease heart rate. Patient has an appointment with  Routine cardiology tomorrow. Discussed with Dr. Ardis Hughs. Will proceed with procedure.

## 2015-11-09 ENCOUNTER — Encounter: Payer: Self-pay | Admitting: Cardiology

## 2015-11-09 ENCOUNTER — Telehealth: Payer: Self-pay | Admitting: *Deleted

## 2015-11-09 ENCOUNTER — Ambulatory Visit (INDEPENDENT_AMBULATORY_CARE_PROVIDER_SITE_OTHER): Payer: BLUE CROSS/BLUE SHIELD | Admitting: Cardiology

## 2015-11-09 ENCOUNTER — Other Ambulatory Visit: Payer: BLUE CROSS/BLUE SHIELD

## 2015-11-09 VITALS — BP 124/80 | HR 46 | Ht 70.0 in | Wt 211.0 lb

## 2015-11-09 DIAGNOSIS — R000 Tachycardia, unspecified: Secondary | ICD-10-CM

## 2015-11-09 NOTE — Patient Instructions (Signed)
Medication Instructions:  The current medical regimen is effective;  continue present plan and medications.  Labwork: Please have blood work today (BMP, TSH and MG)  Testing/Procedures: Your physician has requested that you have an echocardiogram. Echocardiography is a painless test that uses sound waves to create images of your heart. It provides your doctor with information about the size and shape of your heart and how well your heart's chambers and valves are working. This procedure takes approximately one hour. There are no restrictions for this procedure.  Follow-Up: Follow up in 3 months with Dr [name] in [place].  If you need a refill on your cardiac medications before your next appointment, please call your pharmacy.  Thank you for choosing [facility]!!

## 2015-11-09 NOTE — Telephone Encounter (Signed)
  Follow up Call-  Call back number 11/08/2015 01/12/2015  Post procedure Call Back phone  # [phone]  Permission to leave phone message Yes Yes     Patient questions:  Do you have a fever, pain , or abdominal swelling? No. Pain Score  0 *  Have you tolerated food without any problems? Yes.    Have you been able to return to your normal activities? Yes.    Do you have any questions about your discharge instructions: Diet   No. Medications  No. Follow up visit  No.  Do you have questions or concerns about your Care? No.  Actions: * If pain score is 4 or above: No action needed, pain <4.pt did c/o some abd discomfort -3 on pain scale , patient says he is passing flatus and discomfort has gotten better. Pt denies bleeding or fever or abd distention . Instructed pt to call back if any of these symptoms occur or if discomfort worsens.

## 2015-11-10 ENCOUNTER — Other Ambulatory Visit: Payer: Self-pay | Admitting: *Deleted

## 2015-11-10 ENCOUNTER — Ambulatory Visit (INDEPENDENT_AMBULATORY_CARE_PROVIDER_SITE_OTHER): Payer: BLUE CROSS/BLUE SHIELD

## 2015-11-10 DIAGNOSIS — I10 Essential (primary) hypertension: Secondary | ICD-10-CM | POA: Diagnosis not present

## 2015-11-10 LAB — BASIC METABOLIC PANEL
BUN/Creatinine Ratio: 12 (ref 9–20)
BUN: 13 mg/dL (ref 6–24)
CALCIUM: 9.5 mg/dL (ref 8.7–10.2)
CHLORIDE: 102 mmol/L (ref 96–106)
CO2: 24 mmol/L (ref 18–29)
Creatinine, Ser: 1.11 mg/dL (ref 0.76–1.27)
GFR calc Af Amer: 87 mL/min/{1.73_m2} (ref >59)
GFR calc non Af Amer: 75 mL/min/{1.73_m2} (ref >59)
Glucose: 91 mg/dL (ref 65–99)
POTASSIUM: 4 mmol/L (ref 3.5–5.2)
Sodium: 145 mmol/L — ABNORMAL HIGH (ref 134–144)

## 2015-11-10 LAB — TSH: TSH: 2.12 u[IU]/mL (ref 0.450–4.500)

## 2015-11-10 LAB — MAGNESIUM: Magnesium: 2.3 mg/dL (ref 1.6–2.3)

## 2015-11-10 MED ORDER — NITROGLYCERIN 0.4 MG SL SUBL: 0.4000 mg | SUBLINGUAL_TABLET | SUBLINGUAL | Status: DC | PRN

## 2015-11-11 ENCOUNTER — Telehealth: Payer: Self-pay | Admitting: Pediatrics

## 2015-11-11 ENCOUNTER — Telehealth: Payer: Self-pay | Admitting: Cardiology

## 2015-11-11 DIAGNOSIS — R002 Palpitations: Secondary | ICD-10-CM

## 2015-11-11 DIAGNOSIS — I471 Supraventricular tachycardia: Secondary | ICD-10-CM

## 2015-11-11 NOTE — Telephone Encounter (Signed)
Spoke with pt, he had his GXT yesterday and he reports the phyisican at [facility] talked to dr hochrein and the pt is wondering the next step. Aware the GXT has not been resulted in [facility] yet but will forward for dr hochrein's input.

## 2015-11-11 NOTE — Telephone Encounter (Signed)
Pt calling re next plan of treatment -pls call [phone] or [phone]

## 2015-11-14 ENCOUNTER — Telehealth: Payer: Self-pay | Admitting: Cardiology

## 2015-11-14 NOTE — Telephone Encounter (Signed)
I tried to call the patient today to discuss his stress test results.  Called home number and no answer.  Tried cell and I left a message.

## 2015-11-15 LAB — EXERCISE TOLERANCE TEST
CHL CUP MPHR: 166 {beats}/min
CHL CUP RESTING HR STRESS: 54 {beats}/min
CHL RATE OF PERCEIVED EXERTION: 9
CSEPEDS: 51 s
CSEPHR: 73 %
Estimated workload: 10.2 METS
Exercise duration (min): 9 min
Peak HR: 122 {beats}/min

## 2015-11-15 NOTE — Telephone Encounter (Signed)
I finally had a chance to review the tracings from the stress test.  I do not see ischemic changes.  However, given the pain that he had with the test I would like to screen with another stress test.  We will need to arrange a Lexiscan Myoview.

## 2015-11-16 ENCOUNTER — Telehealth: Payer: Self-pay | Admitting: Cardiology

## 2015-11-16 ENCOUNTER — Ambulatory Visit (INDEPENDENT_AMBULATORY_CARE_PROVIDER_SITE_OTHER): Payer: Worker's Compensation | Admitting: Nurse Practitioner

## 2015-11-16 VITALS — BP 122/73 | HR 48 | Ht 70.0 in | Wt 211.0 lb

## 2015-11-16 DIAGNOSIS — S01412A Laceration without foreign body of left cheek and temporomandibular area, initial encounter: Secondary | ICD-10-CM | POA: Diagnosis not present

## 2015-11-16 DIAGNOSIS — S0180XA Unspecified open wound of other part of head, initial encounter: Secondary | ICD-10-CM

## 2015-11-16 MED ORDER — CEPHALEXIN 500 MG PO CAPS: 500.0000 mg | ORAL_CAPSULE | Freq: Three times a day (TID) | ORAL | Status: DC

## 2015-11-16 NOTE — Telephone Encounter (Signed)
Called the patient and left a voicemail to schedule his lexiscan.

## 2015-11-16 NOTE — Telephone Encounter (Signed)
Leave message for pt to call back 

## 2015-11-16 NOTE — Patient Instructions (Signed)
Facial Laceration ° A facial laceration is a cut on the face. These injuries can be painful and cause bleeding. Lacerations usually heal quickly, but they need special care to reduce scarring. °DIAGNOSIS  °Your health care provider will take a medical history, ask for details about how the injury occurred, and examine the wound to determine how deep the cut is. °TREATMENT  °Some facial lacerations may not require closure. Others may not be able to be closed because of an increased risk of infection. The risk of infection and the chance for successful closure will depend on various factors, including the amount of time since the injury occurred. °The wound may be cleaned to help prevent infection. If closure is appropriate, pain medicines may be given if needed. Your health care provider will use stitches (sutures), wound glue (adhesive), or skin adhesive strips to repair the laceration. These tools bring the skin edges together to allow for faster healing and a better cosmetic outcome. If needed, you may also be given a tetanus shot. °HOME CARE INSTRUCTIONS °· Only take over-the-counter or prescription medicines as directed by your health care provider. °· Follow your health care provider's instructions for wound care. These instructions will vary depending on the technique used for closing the wound. °For Sutures: °· Keep the wound clean and dry.   °· If you were given a bandage (dressing), you should change it at least once a day. Also change the dressing if it becomes wet or dirty, or as directed by your health care provider.   °· Wash the wound with soap and water 2 times a day. Rinse the wound off with water to remove all soap. Pat the wound dry with a clean towel.   °· After cleaning, apply a thin layer of the antibiotic ointment recommended by your health care provider. This will help prevent infection and keep the dressing from sticking.   °· You may shower as usual after the first 24 hours. Do not soak the  wound in water until the sutures are removed.   °· Get your sutures removed as directed by your health care provider. With facial lacerations, sutures should usually be taken out after 4-5 days to avoid stitch marks.   °· Wait a few days after your sutures are removed before applying any makeup. °For Skin Adhesive Strips: °· Keep the wound clean and dry.   °· Do not get the skin adhesive strips wet. You may bathe carefully, using caution to keep the wound dry.   °· If the wound gets wet, pat it dry with a clean towel.   °· Skin adhesive strips will fall off on their own. You may trim the strips as the wound heals. Do not remove skin adhesive strips that are still stuck to the wound. They will fall off in time.   °For Wound Adhesive: °· You may briefly wet your wound in the shower or bath. Do not soak or scrub the wound. Do not swim. Avoid periods of heavy sweating until the skin adhesive has fallen off on its own. After showering or bathing, gently pat the wound dry with a clean towel.   °· Do not apply liquid medicine, cream medicine, ointment medicine, or makeup to your wound while the skin adhesive is in place. This may loosen the film before your wound is healed.   °· If a dressing is placed over the wound, be careful not to apply tape directly over the skin adhesive. This may cause the adhesive to be pulled off before the wound is healed.   °· Avoid   prolonged exposure to sunlight or tanning lamps while the skin adhesive is in place. °· The skin adhesive will usually remain in place for 5-10 days, then naturally fall off the skin. Do not pick at the adhesive film.   °After Healing: °Once the wound has healed, cover the wound with sunscreen during the day for 1 full year. This can help minimize scarring. Exposure to ultraviolet light in the first year will darken the scar. It can take 1-2 years for the scar to lose its redness and to heal completely.  °SEEK MEDICAL CARE IF: °· You have a fever. °SEEK IMMEDIATE  MEDICAL CARE IF: °· You have redness, pain, or swelling around the wound.   °· You see a yellowish-white fluid (pus) coming from the wound.   °  °This information is not intended to replace advice given to you by your health care provider. Make sure you discuss any questions you have with your health care provider. °  °Document Released: 08/30/2004 Document Revised: 08/13/2014 Document Reviewed: 03/05/2013 °Elsevier Interactive Patient Education ©2016 [employer] ° °

## 2015-11-16 NOTE — Telephone Encounter (Signed)
Lexiscan Myoview was ordered and send to scheduler to be schedule

## 2015-11-16 NOTE — Progress Notes (Signed)
   Subjective:    Patient ID: Jeffrey Hooper, male    DOB: June 27, 1961, 55 y.o.   MRN: [id number]  HPI Date if injury: 11/16/15  [facility]   Patient was reading water meter for for town of [place] and was attacked by a dog- Insurance account manager on face.   Review of Systems  Constitutional: Negative.   HENT: Negative.   Respiratory: Negative.   Cardiovascular: Negative.   Genitourinary: Negative.   Neurological: Negative.   Psychiatric/Behavioral: Negative.   All other systems reviewed and are negative.      Objective:   Physical Exam  Constitutional: He appears well-developed and well-nourished. No distress.  Cardiovascular: Normal rate, regular rhythm and normal heart sounds.   Pulmonary/Chest: Effort normal and breath sounds normal.  Skin: Skin is warm.  Superficial wound 3cm jagged to left cheek  Psychiatric: He has a normal mood and affect. His behavior is normal. Judgment and thought content normal.   BP 122/73 mmHg  Pulse 48  Ht 5\' 10"  (1.778 m)  Wt 211 lb (95.709 kg)  BMI 30.28 kg/m2  Wound cleaned with betadine       Assessment & Plan:   1. Open facial wound, initial encounter    Meds ordered this encounter  Medications  . cephALEXin (KEFLEX) 500 MG capsule    Sig: Take 1 capsule (500 mg total) by mouth 3 (three) times daily.    Dispense:  30 capsule    Refill:  0    Order Specific Question:  Supervising Provider    Answer:  Joycelyn Man   Watch for signs of infection Do not pick or scratch RTO prn  Mary-Margaret Hassell Done, FNP

## 2015-11-18 ENCOUNTER — Encounter: Payer: Self-pay | Admitting: Gastroenterology

## 2015-11-22 ENCOUNTER — Telehealth (HOSPITAL_COMMUNITY): Payer: Self-pay | Admitting: *Deleted

## 2015-11-22 NOTE — Telephone Encounter (Signed)
Patient given detailed instructions per Myocardial Perfusion Study Information Sheet for the test on 11/24/15 Patient notified to arrive 15 minutes early and that it is imperative to arrive on time for appointment to keep from having the test rescheduled.  If you need to cancel or reschedule your appointment, please call the office within 24 hours of your appointment. Failure to do so may result in a cancellation of your appointment, and a $50 no show fee. Patient verbalized understanding.Hubbard Robinson, RN .tcd

## 2015-11-24 ENCOUNTER — Ambulatory Visit (HOSPITAL_COMMUNITY): Payer: BLUE CROSS/BLUE SHIELD | Attending: Cardiology

## 2015-11-24 DIAGNOSIS — I10 Essential (primary) hypertension: Secondary | ICD-10-CM | POA: Insufficient documentation

## 2015-11-24 DIAGNOSIS — I471 Supraventricular tachycardia: Secondary | ICD-10-CM | POA: Diagnosis not present

## 2015-11-24 DIAGNOSIS — R002 Palpitations: Secondary | ICD-10-CM | POA: Diagnosis not present

## 2015-11-24 DIAGNOSIS — R0609 Other forms of dyspnea: Secondary | ICD-10-CM | POA: Diagnosis not present

## 2015-11-24 DIAGNOSIS — R42 Dizziness and giddiness: Secondary | ICD-10-CM | POA: Insufficient documentation

## 2015-11-24 DIAGNOSIS — R9439 Abnormal result of other cardiovascular function study: Secondary | ICD-10-CM | POA: Diagnosis not present

## 2015-11-24 DIAGNOSIS — R0789 Other chest pain: Secondary | ICD-10-CM | POA: Diagnosis not present

## 2015-11-24 DIAGNOSIS — I4719 Other supraventricular tachycardia: Secondary | ICD-10-CM

## 2015-11-24 LAB — MYOCARDIAL PERFUSION IMAGING
CHL CUP NUCLEAR SRS: 2
CHL CUP NUCLEAR SSS: 5
CHL CUP RESTING HR STRESS: 47 {beats}/min
LV dias vol: 141 mL (ref 62–150)
LVSYSVOL: 50 mL
NUC STRESS TID: 0.97
Peak HR: 88 {beats}/min
RATE: 0.32
SDS: 3

## 2015-11-24 MED ORDER — TECHNETIUM TC 99M SESTAMIBI GENERIC - CARDIOLITE
31.7000 | Freq: Once | INTRAVENOUS | Status: AC | PRN
  Administered 2015-11-24: 31.7 via INTRAVENOUS

## 2015-11-24 MED ORDER — REGADENOSON 0.4 MG/5ML IV SOLN
0.4000 mg | Freq: Once | INTRAVENOUS | Status: AC
  Administered 2015-11-24: 0.4 mg via INTRAVENOUS

## 2015-11-24 MED ORDER — TECHNETIUM TC 99M SESTAMIBI GENERIC - CARDIOLITE
10.3000 | Freq: Once | INTRAVENOUS | Status: AC | PRN
  Administered 2015-11-24: 10 via INTRAVENOUS

## 2015-11-25 ENCOUNTER — Telehealth: Payer: Self-pay | Admitting: Cardiovascular Disease

## 2015-11-25 ENCOUNTER — Ambulatory Visit (HOSPITAL_COMMUNITY): Payer: BLUE CROSS/BLUE SHIELD | Attending: Cardiology

## 2015-11-25 ENCOUNTER — Other Ambulatory Visit: Payer: Self-pay

## 2015-11-25 DIAGNOSIS — I340 Nonrheumatic mitral (valve) insufficiency: Secondary | ICD-10-CM | POA: Insufficient documentation

## 2015-11-25 DIAGNOSIS — R000 Tachycardia, unspecified: Secondary | ICD-10-CM | POA: Insufficient documentation

## 2015-11-25 DIAGNOSIS — I351 Nonrheumatic aortic (valve) insufficiency: Secondary | ICD-10-CM | POA: Insufficient documentation

## 2015-11-25 DIAGNOSIS — I119 Hypertensive heart disease without heart failure: Secondary | ICD-10-CM | POA: Diagnosis not present

## 2015-11-25 DIAGNOSIS — E785 Hyperlipidemia, unspecified: Secondary | ICD-10-CM | POA: Insufficient documentation

## 2015-11-25 NOTE — Telephone Encounter (Signed)
Closed encounter °

## 2015-11-30 ENCOUNTER — Encounter: Payer: Self-pay | Admitting: Cardiology

## 2015-12-01 ENCOUNTER — Other Ambulatory Visit: Payer: Self-pay | Admitting: Cardiology

## 2015-12-01 ENCOUNTER — Encounter: Payer: Self-pay | Admitting: Cardiology

## 2015-12-01 ENCOUNTER — Ambulatory Visit (INDEPENDENT_AMBULATORY_CARE_PROVIDER_SITE_OTHER): Payer: BLUE CROSS/BLUE SHIELD | Admitting: Cardiology

## 2015-12-01 VITALS — BP 122/67 | HR 52 | Ht 70.0 in | Wt 213.1 lb

## 2015-12-01 DIAGNOSIS — Z01818 Encounter for other preprocedural examination: Secondary | ICD-10-CM

## 2015-12-01 DIAGNOSIS — D689 Coagulation defect, unspecified: Secondary | ICD-10-CM

## 2015-12-01 DIAGNOSIS — R9439 Abnormal result of other cardiovascular function study: Secondary | ICD-10-CM

## 2015-12-01 DIAGNOSIS — R0789 Other chest pain: Secondary | ICD-10-CM | POA: Diagnosis not present

## 2015-12-01 LAB — PROTIME-INR
INR: 0.93 (ref <1.50)
Prothrombin Time: 12.6 seconds (ref 11.6–15.2)

## 2015-12-01 LAB — APTT: APTT: 38 s — AB (ref 24–37)

## 2015-12-01 NOTE — Progress Notes (Signed)
Cardiology Office Note   Date:  12/01/2015   ID:  Jeffrey Hooper, DOB [birth year], MRN [id number]  PCP:  [name], MD  Cardiologist:   Minus Breeding, MD   Chief Complaint  Patient presents with  . Chest Pain  . Abnormal Stress Test      History of Present Illness: Jeffrey Hooper is a 55 y.o. male who presents for evaluation of palpitations, chest pain and abnormal stress test. He was in his primary care office recently and EKG was obtained that suggested atrial fibrillation. However, on that day I reviewed the EKG and it was premature atrial contractions against normal sinus rhythm. There was no fibrillation. He did have a Holter monitor placed.  This demonstrated runs of atrial tachycardia. The patients continuing to feel the palpitations.  He was also having some chest discomfort.  I felt that this was somewhat atypical and he had a POET (Plain Old Exercise Treadmill).  This was read as abnormal but I do not appreciate significant ST depression. He had an echo prior echocardiogram which was normal. Most recently had a stress perfusion study which suggested a moderate sized defect in the inferior wall with ischemia. The EF was well-preserved. Of note he's continued to have some episodes of intermittent resting chest discomfort. He can't quantify or qualify this. They don't seem to be associated symptoms. There doesn't seem to be nausea vomiting or diaphoresis. He does have palpitations ongoing.  Past Medical History  Diagnosis Date  . Hypertension   . Pulmonary nodule   . Hypothyroid   . GERD (gastroesophageal reflux disease)   . Prostatism   . Seasonal allergies   . Hyperlipidemia   . Multiple benign nevi   . Hiatal hernia   . Allergy   . Arthritis     Past Surgical History  Procedure Laterality Date  . Back surgery      T12 - L1  . Spinal fusion      C6-7  . Tonsillectomy    . Vasectomy    . Sinus signs    . Radial nerve    . Mass excision  06/25/2012   Procedure: EXCISION MASS;  Surgeon: Wynonia Sours, MD;  Location: [facility];  Service: Orthopedics;  Laterality: Left;  transection of NEUROMA, BURYING RADIAL NERVE IN BRACHIORADIALIS LEFT SIDE     Current Outpatient Prescriptions  Medication Sig Dispense Refill  . amLODipine (NORVASC) 5 MG tablet Take 1 tablet (5 mg total) by mouth daily. 90 tablet 3  . aspirin 81 MG tablet Take 81 mg by mouth daily.    [name] atorvastatin (LIPITOR) 40 MG tablet TAKE ONE TABLET BY MOUTH ONE TIME DAILY 90 tablet 0  . hydrochlorothiazide (HYDRODIURIL) 25 MG tablet Take 1 tablet (25 mg total) by mouth daily. 90 tablet 1  . levothyroxine (SYNTHROID, LEVOTHROID) 100 MCG tablet Take 1 tablet (100 mcg total) by mouth daily. 90 tablet 1  . Melatonin 10 MG TABS Take by mouth. 10mg  one time at bedtime    . metoprolol tartrate (LOPRESSOR) 50 MG tablet Take 1 tablet (50 mg total) by mouth 2 (two) times daily. 180 tablet 3  . mometasone (NASONEX) 50 MCG/ACT nasal spray Place 2 sprays into the nose daily. 17 g 3  . nitroGLYCERIN (NITROSTAT) 0.4 MG SL tablet Place 1 tablet (0.4 mg total) under the tongue every 5 (five) minutes as needed for chest pain. 50 tablet 0  . omeprazole (PRILOSEC) 40 MG capsule Take  1 capsule (40 mg total) by mouth daily. 90 capsule 0   No current facility-administered medications for this visit.    Allergies:   Prednisone; Doxycycline hyclate; Penicillins; and Fentanyl    Social History:  The patient  reports that he quit smoking about 9 years ago. His smoking use included Cigarettes. He quit after 20 years of use. He has never used smokeless tobacco. He reports that he drinks alcohol. He reports that he does not use illicit drugs.   Family History:  The patient's family history includes Congestive Heart Failure in his mother; Heart attack (age of onset: 89) in his father; Heart failure (age of onset: 67) in his mother. There is no history of Colon cancer.    ROS:  Please see the  history of present illness.   Otherwise, review of systems are positive for none .   All other systems are reviewed and negative.    PHYSICAL EXAM: VS:  BP 122/67 mmHg  Pulse 52  Ht 5\' 10"  (1.778 m)  Wt 213 lb 2 oz (96.673 kg)  BMI 30.58 kg/m2 , BMI Body mass index is 30.58 kg/(m^2). GENERAL:  Well appearing HEENT:  Pupils equal round and reactive, fundi not visualized, oral mucosa unremarkable NECK:  No jugular venous distention, waveform within normal limits, carotid upstroke brisk and symmetric, no bruits, no thyromegaly LYMPHATICS:  No cervical, inguinal adenopathy LUNGS:  Clear to auscultation bilaterally BACK:  No CVA tenderness CHEST:  Unremarkable HEART:  PMI not displaced or sustained,S1 and S2 within normal limits, no S3, no S4, no clicks, no rubs, no murmurs ABD:  Flat, positive bowel sounds normal in frequency in pitch, no bruits, no rebound, no guarding, no midline pulsatile mass, no hepatomegaly, no splenomegaly EXT:  2 plus pulses throughout, no edema, no cyanosis no clubbing SKIN:  No rashes no nodules NEURO:  Cranial nerves II through XII grossly intact, motor grossly intact throughout PSYCH:  Cognitively intact, oriented to person place and time    EKG:  EKG is not ordered today.   Recent Labs: 05/18/2015: ALT 28; Platelets 323 11/09/2015: BUN 13; Creatinine, Ser 1.11; Magnesium 2.3; Potassium 4.0; Sodium 145*; TSH 2.120    Lipid Panel    Component Value Date/Time   CHOL 127 08/07/2013 1049   CHOL 122 04/28/2013 1505   CHOL 136 10/29/2012 0922   TRIG 83 08/07/2013 1049   TRIG 139 04/28/2013 1505   TRIG 103 10/29/2012 0922   HDL 45 08/07/2013 1049   HDL 44 04/28/2013 1505   HDL 41 10/29/2012 0922   HDL 65 05/01/2012 1033   CHOLHDL 2.8 08/07/2013 1049   LDLCALC 65 08/07/2013 1049   LDLCALC 50 04/28/2013 1505   LDLCALC 74 10/29/2012 0922   LDLCALC 84 05/01/2012 1033      Wt Readings from Last 3 Encounters:  12/01/15 213 lb 2 oz (96.673 kg)    11/24/15 211 lb (95.709 kg)  11/16/15 211 lb (95.709 kg)      Other studies Reviewed: Additional studies/ records that were reviewed today include: Echo and Lexiscan Myoview.. Review of the above records demonstrates:  Please see elsewhere in the note.     ASSESSMENT AND PLAN:  CHEST PAIN:  Given the abnormal stress test and ongoing symptoms he will have a cardiac catheterization. The patient understands that risks included but are not limited to stroke (1 in 1000), death (1 in 62), kidney failure [usually temporary] (1 in 500), bleeding (1 in 200), allergic reaction [possibly serious] (1  in 200).  The patient understands and agrees to proceed.   PALPITATIONS:  These seem to be somewhat mild and no change in therapy is planned at this point.   Current medicines are reviewed at length with the patient today.  The patient does not have concerns regarding medicines.  The following changes have been made:  no change  Labs/ tests ordered today include:   Orders Placed This Encounter  Procedures  . CBC  . Basic metabolic panel  . Protime-INR  . APTT  . LEFT HEART CATHETERIZATION WITH CORONARY ANGIOGRAM     Disposition:   FU with me after the cath.     Signed, Minus Breeding, MD  12/01/2015 5:13 PM     [facility]

## 2015-12-01 NOTE — Patient Instructions (Signed)
Your physician has requested that you have a cardiac catheterization. Cardiac catheterization is used to diagnose and/or treat various heart conditions. Doctors may recommend this procedure for a number of different reasons. The most common reason is to evaluate chest pain. Chest pain can be a symptom of coronary artery disease (CAD), and cardiac catheterization can show whether plaque is narrowing or blocking your heart's arteries. This procedure is also used to evaluate the valves, as well as measure the blood flow and oxygen levels in different parts of your heart. For further information please visit [email or web address]. Please follow instruction sheet, as given. He requests for this to be done tomorrow.  Your physician recommends that you return for lab work TODAY.

## 2015-12-02 ENCOUNTER — Ambulatory Visit (HOSPITAL_COMMUNITY)
Admission: RE | Admit: 2015-12-02 | Discharge: 2015-12-04 | Disposition: A | Discharge status: Home / Self Care | Payer: BLUE CROSS/BLUE SHIELD | Source: Ambulatory Visit | Attending: Cardiology | Admitting: Cardiology

## 2015-12-02 ENCOUNTER — Encounter (HOSPITAL_COMMUNITY)
Admission: RE | Disposition: A | Discharge status: Home / Self Care | Payer: Self-pay | Source: Ambulatory Visit | Attending: Cardiology

## 2015-12-02 DIAGNOSIS — E039 Hypothyroidism, unspecified: Secondary | ICD-10-CM | POA: Diagnosis not present

## 2015-12-02 DIAGNOSIS — R931 Abnormal findings on diagnostic imaging of heart and coronary circulation: Secondary | ICD-10-CM | POA: Insufficient documentation

## 2015-12-02 DIAGNOSIS — Z880 Allergy status to penicillin: Secondary | ICD-10-CM | POA: Insufficient documentation

## 2015-12-02 DIAGNOSIS — R001 Bradycardia, unspecified: Secondary | ICD-10-CM | POA: Insufficient documentation

## 2015-12-02 DIAGNOSIS — I2584 Coronary atherosclerosis due to calcified coronary lesion: Secondary | ICD-10-CM | POA: Diagnosis not present

## 2015-12-02 DIAGNOSIS — R079 Chest pain, unspecified: Secondary | ICD-10-CM | POA: Diagnosis present

## 2015-12-02 DIAGNOSIS — Z87891 Personal history of nicotine dependence: Secondary | ICD-10-CM | POA: Diagnosis not present

## 2015-12-02 DIAGNOSIS — I2510 Atherosclerotic heart disease of native coronary artery without angina pectoris: Secondary | ICD-10-CM

## 2015-12-02 DIAGNOSIS — Z8249 Family history of ischemic heart disease and other diseases of the circulatory system: Secondary | ICD-10-CM | POA: Diagnosis not present

## 2015-12-02 DIAGNOSIS — Z7982 Long term (current) use of aspirin: Secondary | ICD-10-CM | POA: Diagnosis not present

## 2015-12-02 DIAGNOSIS — E785 Hyperlipidemia, unspecified: Secondary | ICD-10-CM | POA: Insufficient documentation

## 2015-12-02 DIAGNOSIS — I10 Essential (primary) hypertension: Secondary | ICD-10-CM | POA: Diagnosis not present

## 2015-12-02 DIAGNOSIS — R9439 Abnormal result of other cardiovascular function study: Secondary | ICD-10-CM

## 2015-12-02 DIAGNOSIS — Z9861 Coronary angioplasty status: Secondary | ICD-10-CM

## 2015-12-02 DIAGNOSIS — I959 Hypotension, unspecified: Secondary | ICD-10-CM | POA: Insufficient documentation

## 2015-12-02 DIAGNOSIS — Z955 Presence of coronary angioplasty implant and graft: Secondary | ICD-10-CM

## 2015-12-02 HISTORY — DX: Cardiac arrhythmia, unspecified: I49.9

## 2015-12-02 HISTORY — PX: CARDIAC CATHETERIZATION: SHX172

## 2015-12-02 HISTORY — DX: Atherosclerotic heart disease of native coronary artery without angina pectoris: I25.10

## 2015-12-02 HISTORY — DX: Pneumonia, unspecified organism: J18.9

## 2015-12-02 HISTORY — DX: Abnormal result of other cardiovascular function study: R94.39

## 2015-12-02 HISTORY — DX: Myoneural disorder, unspecified: G70.9

## 2015-12-02 LAB — BASIC METABOLIC PANEL
BUN: 15 mg/dL (ref 7–25)
CALCIUM: 9.3 mg/dL (ref 8.6–10.3)
CHLORIDE: 101 mmol/L (ref 98–110)
CO2: 29 mmol/L (ref 20–31)
CREATININE: 1.09 mg/dL (ref 0.70–1.33)
GLUCOSE: 160 mg/dL — AB (ref 65–99)
Potassium: 4.1 mmol/L (ref 3.5–5.3)
Sodium: 141 mmol/L (ref 135–146)

## 2015-12-02 LAB — CBC
HEMATOCRIT: 43.1 % (ref 38.5–50.0)
Hemoglobin: 14.7 g/dL (ref 13.2–17.1)
MCH: 30.1 pg (ref 27.0–33.0)
MCHC: 34.1 g/dL (ref 32.0–36.0)
MCV: 88.3 fL (ref 80.0–100.0)
MPV: 12.5 fL (ref 7.5–12.5)
PLATELETS: 208 10*3/uL (ref 140–400)
RBC: 4.88 MIL/uL (ref 4.20–5.80)
RDW: 14.5 % (ref 11.0–15.0)
WBC: 8 10*3/uL (ref 3.8–10.8)

## 2015-12-02 LAB — POCT ACTIVATED CLOTTING TIME
ACTIVATED CLOTTING TIME: 229 s
Activated Clotting Time: 446 seconds

## 2015-12-02 LAB — TROPONIN I: Troponin I: 0.09 ng/mL — ABNORMAL HIGH (ref <0.031)

## 2015-12-02 SURGERY — LEFT HEART CATH AND CORONARY ANGIOGRAPHY
Anesthesia: LOCAL

## 2015-12-02 MED ORDER — SODIUM CHLORIDE 0.9 % IV SOLN: 250.0000 mL | INTRAVENOUS | Status: DC | PRN

## 2015-12-02 MED ORDER — METOPROLOL TARTRATE 12.5 MG HALF TABLET: 12.5000 mg | ORAL_TABLET | Freq: Two times a day (BID) | ORAL | Status: DC

## 2015-12-02 MED ORDER — IOPAMIDOL (ISOVUE-370) INJECTION 76%
INTRAVENOUS | Status: AC
  Filled 2015-12-02: qty 100

## 2015-12-02 MED ORDER — FENTANYL CITRATE (PF) 100 MCG/2ML IJ SOLN
INTRAMUSCULAR | Status: DC | PRN
  Administered 2015-12-02: 25 ug via INTRAVENOUS
  Administered 2015-12-02: 50 ug via INTRAVENOUS

## 2015-12-02 MED ORDER — HEPARIN (PORCINE) IN NACL 2-0.9 UNIT/ML-% IJ SOLN
INTRAMUSCULAR | Status: AC
  Filled 2015-12-02: qty 500

## 2015-12-02 MED ORDER — AMLODIPINE BESYLATE 5 MG PO TABS
5.0000 mg | ORAL_TABLET | Freq: Every day | ORAL | Status: DC
  Administered 2015-12-03 – 2015-12-04 (×2): 5 mg via ORAL
  Filled 2015-12-02 (×2): qty 1

## 2015-12-02 MED ORDER — ATROPINE SULFATE 1 MG/10ML IJ SOSY
PREFILLED_SYRINGE | INTRAMUSCULAR | Status: AC
  Administered 2015-12-02: 0.5 mg
  Filled 2015-12-02: qty 10

## 2015-12-02 MED ORDER — HEPARIN SODIUM (PORCINE) 1000 UNIT/ML IJ SOLN
INTRAMUSCULAR | Status: DC | PRN
  Administered 2015-12-02: 5000 [IU] via INTRAVENOUS
  Administered 2015-12-02 (×2): 3000 [IU] via INTRAVENOUS

## 2015-12-02 MED ORDER — HYDROCHLOROTHIAZIDE 25 MG PO TABS
25.0000 mg | ORAL_TABLET | Freq: Every day | ORAL | Status: DC
  Administered 2015-12-02 – 2015-12-04 (×3): 25 mg via ORAL
  Filled 2015-12-02 (×3): qty 1

## 2015-12-02 MED ORDER — MORPHINE SULFATE (PF) 2 MG/ML IV SOLN: 1.0000 mg | INTRAVENOUS | Status: DC | PRN

## 2015-12-02 MED ORDER — TICAGRELOR 90 MG PO TABS
90.0000 mg | ORAL_TABLET | Freq: Two times a day (BID) | ORAL | Status: DC
  Administered 2015-12-03 – 2015-12-04 (×4): 90 mg via ORAL
  Filled 2015-12-02 (×5): qty 1

## 2015-12-02 MED ORDER — ASPIRIN 81 MG PO CHEW
81.0000 mg | CHEWABLE_TABLET | ORAL | Status: AC
  Administered 2015-12-02: 81 mg via ORAL

## 2015-12-02 MED ORDER — SODIUM CHLORIDE 0.9 % WEIGHT BASED INFUSION: 1.0000 mL/kg/h | INTRAVENOUS | Status: AC

## 2015-12-02 MED ORDER — ASPIRIN 81 MG PO CHEW
CHEWABLE_TABLET | ORAL | Status: AC
  Administered 2015-12-02: 81 mg via ORAL
  Filled 2015-12-02: qty 1

## 2015-12-02 MED ORDER — ASPIRIN 81 MG PO CHEW
81.0000 mg | CHEWABLE_TABLET | Freq: Every day | ORAL | Status: DC
  Administered 2015-12-03 – 2015-12-04 (×2): 81 mg via ORAL
  Filled 2015-12-02 (×2): qty 1

## 2015-12-02 MED ORDER — METOPROLOL TARTRATE 25 MG PO TABS: 50.0000 mg | ORAL_TABLET | Freq: Two times a day (BID) | ORAL | Status: DC

## 2015-12-02 MED ORDER — LIDOCAINE HCL (PF) 1 % IJ SOLN
INTRAMUSCULAR | Status: DC | PRN
  Administered 2015-12-02: 2 mL

## 2015-12-02 MED ORDER — ACETAMINOPHEN 325 MG PO TABS: 650.0000 mg | ORAL_TABLET | ORAL | Status: DC | PRN

## 2015-12-02 MED ORDER — ATORVASTATIN CALCIUM 40 MG PO TABS
40.0000 mg | ORAL_TABLET | Freq: Every day | ORAL | Status: DC
Start: 2015-12-02 — End: 2015-12-03
  Administered 2015-12-02: 19:00:00 40 mg via ORAL
  Filled 2015-12-02: qty 1

## 2015-12-02 MED ORDER — VERAPAMIL HCL 2.5 MG/ML IV SOLN
INTRAVENOUS | Status: AC
  Filled 2015-12-02: qty 2

## 2015-12-02 MED ORDER — DIPHENHYDRAMINE HCL 50 MG/ML IJ SOLN
25.0000 mg | Freq: Three times a day (TID) | INTRAMUSCULAR | Status: DC | PRN
  Administered 2015-12-02: 25 mg via INTRAVENOUS
  Filled 2015-12-02 (×2): qty 1

## 2015-12-02 MED ORDER — IOPAMIDOL (ISOVUE-370) INJECTION 76%
INTRAVENOUS | Status: DC | PRN
  Administered 2015-12-02: 145 mL

## 2015-12-02 MED ORDER — SODIUM CHLORIDE 0.9% FLUSH
3.0000 mL | Freq: Two times a day (BID) | INTRAVENOUS | Status: DC
  Administered 2015-12-02 – 2015-12-04 (×5): 3 mL via INTRAVENOUS

## 2015-12-02 MED ORDER — NITROGLYCERIN 0.4 MG SL SUBL
0.4000 mg | SUBLINGUAL_TABLET | SUBLINGUAL | Status: DC | PRN
  Administered 2015-12-02 (×2): 0.4 mg via SUBLINGUAL
  Filled 2015-12-02: qty 25

## 2015-12-02 MED ORDER — ATROPINE SULFATE 1 MG/10ML IJ SOSY
PREFILLED_SYRINGE | INTRAMUSCULAR | Status: AC
Start: 2015-12-02 — End: 2015-12-02
  Administered 2015-12-02: 0.5 mg
  Filled 2015-12-02: qty 10

## 2015-12-02 MED ORDER — LEVOTHYROXINE SODIUM 100 MCG PO TABS
100.0000 ug | ORAL_TABLET | Freq: Every day | ORAL | Status: DC
  Administered 2015-12-03 – 2015-12-04 (×2): 100 ug via ORAL
  Filled 2015-12-02 (×2): qty 1

## 2015-12-02 MED ORDER — SODIUM CHLORIDE 0.9 % WEIGHT BASED INFUSION: 1.0000 mL/kg/h | INTRAVENOUS | Status: DC

## 2015-12-02 MED ORDER — ATROPINE SULFATE 1 MG/10ML IJ SOSY
0.5000 mg | PREFILLED_SYRINGE | INTRAMUSCULAR | Status: DC | PRN
  Filled 2015-12-02: qty 10

## 2015-12-02 MED ORDER — PANTOPRAZOLE SODIUM 40 MG PO TBEC
40.0000 mg | DELAYED_RELEASE_TABLET | Freq: Every day | ORAL | Status: DC
  Administered 2015-12-03 – 2015-12-04 (×2): 40 mg via ORAL
  Filled 2015-12-02 (×2): qty 1

## 2015-12-02 MED ORDER — OXYCODONE-ACETAMINOPHEN 5-325 MG PO TABS
1.0000 | ORAL_TABLET | Freq: Four times a day (QID) | ORAL | Status: DC | PRN
  Administered 2015-12-02: 2 via ORAL

## 2015-12-02 MED ORDER — OXYCODONE-ACETAMINOPHEN 5-325 MG PO TABS
ORAL_TABLET | ORAL | Status: AC
  Filled 2015-12-02: qty 2

## 2015-12-02 MED ORDER — TICAGRELOR 90 MG PO TABS
ORAL_TABLET | ORAL | Status: AC
  Filled 2015-12-02: qty 1

## 2015-12-02 MED ORDER — NITROGLYCERIN 0.4 MG SL SUBL
SUBLINGUAL_TABLET | SUBLINGUAL | Status: AC
  Administered 2015-12-02: 0.4 mg via SUBLINGUAL
  Filled 2015-12-02: qty 1

## 2015-12-02 MED ORDER — TICAGRELOR 90 MG PO TABS
ORAL_TABLET | ORAL | Status: DC | PRN
  Administered 2015-12-02: 180 mg via ORAL

## 2015-12-02 MED ORDER — NITROGLYCERIN 1 MG/10 ML FOR IR/CATH LAB
INTRA_ARTERIAL | Status: DC | PRN
  Administered 2015-12-02 (×2): 200 ug

## 2015-12-02 MED ORDER — SODIUM CHLORIDE 0.9% FLUSH: 3.0000 mL | INTRAVENOUS | Status: DC | PRN

## 2015-12-02 MED ORDER — ONDANSETRON HCL 4 MG/2ML IJ SOLN
4.0000 mg | Freq: Once | INTRAMUSCULAR | Status: AC
  Administered 2015-12-02: 22:00:00 4 mg via INTRAVENOUS
  Filled 2015-12-02: qty 2

## 2015-12-02 MED ORDER — ONDANSETRON HCL 4 MG/2ML IJ SOLN
4.0000 mg | Freq: Four times a day (QID) | INTRAMUSCULAR | Status: DC | PRN
  Administered 2015-12-02: 4 mg via INTRAVENOUS
  Filled 2015-12-02: qty 2

## 2015-12-02 MED ORDER — LIDOCAINE HCL (PF) 1 % IJ SOLN
INTRAMUSCULAR | Status: AC
  Filled 2015-12-02: qty 30

## 2015-12-02 MED ORDER — SODIUM CHLORIDE 0.9 % WEIGHT BASED INFUSION
3.0000 mL/kg/h | INTRAVENOUS | Status: DC
  Administered 2015-12-02: 3 mL/kg/h via INTRAVENOUS

## 2015-12-02 MED ORDER — VERAPAMIL HCL 2.5 MG/ML IV SOLN
INTRAVENOUS | Status: DC | PRN
  Administered 2015-12-02: 14:00:00 via INTRA_ARTERIAL

## 2015-12-02 MED ORDER — SODIUM CHLORIDE 0.9 % IV BOLUS (SEPSIS)
250.0000 mL | Freq: Once | INTRAVENOUS | Status: AC
  Administered 2015-12-02: 250 mL via INTRAVENOUS

## 2015-12-02 MED ORDER — SODIUM CHLORIDE 0.9% FLUSH: 3.0000 mL | Freq: Two times a day (BID) | INTRAVENOUS | Status: DC

## 2015-12-02 MED ORDER — HEPARIN (PORCINE) IN NACL 2-0.9 UNIT/ML-% IJ SOLN
INTRAMUSCULAR | Status: DC | PRN
  Administered 2015-12-02: 1500 mL

## 2015-12-02 MED ORDER — HEPARIN SODIUM (PORCINE) 1000 UNIT/ML IJ SOLN
INTRAMUSCULAR | Status: AC
  Filled 2015-12-02: qty 1

## 2015-12-02 MED ORDER — HEPARIN (PORCINE) IN NACL 2-0.9 UNIT/ML-% IJ SOLN
INTRAMUSCULAR | Status: AC
  Filled 2015-12-02: qty 1000

## 2015-12-02 MED ORDER — MIDAZOLAM HCL 2 MG/2ML IJ SOLN
INTRAMUSCULAR | Status: DC | PRN
  Administered 2015-12-02 (×2): 1 mg via INTRAVENOUS

## 2015-12-02 MED ORDER — FENTANYL CITRATE (PF) 100 MCG/2ML IJ SOLN
INTRAMUSCULAR | Status: AC
  Filled 2015-12-02: qty 2

## 2015-12-02 MED ORDER — MIDAZOLAM HCL 2 MG/2ML IJ SOLN
INTRAMUSCULAR | Status: AC
  Filled 2015-12-02: qty 2

## 2015-12-02 SURGICAL SUPPLY — 19 items
BALLN EMERGE MR 2.25X12 (BALLOONS) ×4
BALLN ~~LOC~~ EUPHORA RX 3.0X15 (BALLOONS) ×2
BALLOON EMERGE MR 2.25X12 (BALLOONS) IMPLANT
BALLOON ~~LOC~~ EUPHORA RX 3.0X15 (BALLOONS) IMPLANT
CATH INFINITI 5 FR JL3.5 (CATHETERS) ×1 IMPLANT
CATH INFINITI 5FR ANG PIGTAIL (CATHETERS) ×1 IMPLANT
CATH INFINITI JR4 5F (CATHETERS) ×1 IMPLANT
CATH VISTA GUIDE 6FR XBLAD3.5 (CATHETERS) ×1 IMPLANT
DEVICE RAD COMP TR BAND LRG (VASCULAR PRODUCTS) ×1 IMPLANT
GLIDESHEATH SLEND SS 6F .021 (SHEATH) ×1 IMPLANT
KIT ENCORE 26 ADVANTAGE (KITS) ×1 IMPLANT
KIT HEART LEFT (KITS) ×2 IMPLANT
PACK CARDIAC CATHETERIZATION (CUSTOM PROCEDURE TRAY) ×2 IMPLANT
STENT PROMUS PREM MR 2.5X24 (Permanent Stent) ×1 IMPLANT
TRANSDUCER W/STOPCOCK (MISCELLANEOUS) ×2 IMPLANT
TUBING CIL FLEX 10 FLL-RA (TUBING) ×2 IMPLANT
WIRE ASAHI PROWATER 180CM (WIRE) ×2 IMPLANT
WIRE HI TORQ VERSACORE-J 145CM (WIRE) ×1 IMPLANT
WIRE SAFE-T 1.5MM-J .035X260CM (WIRE) ×1 IMPLANT

## 2015-12-02 NOTE — Progress Notes (Addendum)
Responded to pt call out for dizziness; @2037  upon rapid assessment pt is mildly diaphoretic, sitting up in chair, SBP 90. HR mid 30's. Pt is alert.  Reports nausea, "feel like I'm going to pass out." no complaints of chest pain or shortness of breath.   2L 02 via [place] administered; 0.5 mg atropine administered; once HR and BP returned to baseline assisted pt back to bed; and paged Dr. [name]; made aware this is 2nd occurrence. No actions required at this time.   Pt symptoms resolved within 2 minutes s/p administration of atropine. Will continue to monitor and call MD if symptoms return.

## 2015-12-02 NOTE — Progress Notes (Signed)
Called by RN pt;s HR down to 31 some junctional, + nausea, some chest pain mildly diaphoretic,  EKG without acute changes.  Once BP improved after 1/2 amp atropine, 4 mg zofran, and then 250cc saline.  He improved and pain mostly resolved, pt sitting in chair.  I have ordered troponins.  Description seemed vasovagal.  Pt had radial cath.

## 2015-12-02 NOTE — Interval H&P Note (Signed)
History and Physical Interval Note:  12/02/2015 1:39 PM  Jeffrey Hooper  has presented today for surgery, with the diagnosis of CHEST DISCOMFORT  The various methods of treatment have been discussed with the patient and family. After consideration of risks, benefits and other options for treatment, the patient has consented to  Procedure(s): Left Heart Cath and Coronary Angiography (N/A) as a surgical intervention .  The patient's history has been reviewed, patient examined, no change in status, stable for surgery.  I have reviewed the patient's chart and labs.  Questions were answered to the patient's satisfaction.   Cath Lab Visit (complete for each Cath Lab visit)  Clinical Evaluation Leading to the Procedure:   ACS: No.  Non-ACS:    Anginal Classification: CCS III  Anti-ischemic medical therapy: Maximal Therapy (2 or more classes of medications)  Non-Invasive Test Results: Intermediate-risk stress test findings: cardiac mortality 1-3%/year  Prior CABG: No previous CABG        Jeffrey Hooper [facility] 12/02/2015 1:39 PM

## 2015-12-02 NOTE — Progress Notes (Signed)
Pt states he has had CP all morning 2-3// 10, mid chest tightness that is constant, worsens with walking and will become short of breath. Cp  Protocol was initiated and Dr [place] was notified.

## 2015-12-02 NOTE — Progress Notes (Signed)
Responded to brady alarm (SB, 31).  Patient found to be exhibiting signs and symptoms of possible vagal reaction.  Chest  Pain, nausea, diaphoresis, SBP in 70s, HR in 30s.  Given 1/2 amp atropine, 4 mg zonfran, 250 cc IVF bolus, oxygen 2l via [place], 12 lead EKG done; appeared unchanged.  Patient responded well to interventions, sitting comfortably in chair at bedside.  Cecilie Kicks, NP, assisting with management.

## 2015-12-02 NOTE — H&P (View-Only) (Signed)
Cardiology Office Note   Date:  12/01/2015   ID:  Jeffrey Hooper, DOB 29-Jun-1961, MRN [id number]  PCP:  [name], MD  Cardiologist:   Minus Breeding, MD   Chief Complaint  Patient presents with  . Chest Pain  . Abnormal Stress Test      History of Present Illness: Jeffrey Hooper is a 55 y.o. male who presents for evaluation of palpitations, chest pain and abnormal stress test. He was in his primary care office recently and EKG was obtained that suggested atrial fibrillation. However, on that day I reviewed the EKG and it was premature atrial contractions against normal sinus rhythm. There was no fibrillation. He did have a Holter monitor placed.  This demonstrated runs of atrial tachycardia. The patients continuing to feel the palpitations.  He was also having some chest discomfort.  I felt that this was somewhat atypical and he had a POET (Plain Old Exercise Treadmill).  This was read as abnormal but I do not appreciate significant ST depression. He had an echo prior echocardiogram which was normal. Most recently had a stress perfusion study which suggested a moderate sized defect in the inferior wall with ischemia. The EF was well-preserved. Of note he's continued to have some episodes of intermittent resting chest discomfort. He can't quantify or qualify this. They don't seem to be associated symptoms. There doesn't seem to be nausea vomiting or diaphoresis. He does have palpitations ongoing.  Past Medical History  Diagnosis Date  . Hypertension   . Pulmonary nodule   . Hypothyroid   . GERD (gastroesophageal reflux disease)   . Prostatism   . Seasonal allergies   . Hyperlipidemia   . Multiple benign nevi   . Hiatal hernia   . Allergy   . Arthritis     Past Surgical History  Procedure Laterality Date  . Back surgery      T12 - L1  . Spinal fusion      C6-7  . Tonsillectomy    . Vasectomy    . Sinus signs    . Radial nerve    . Mass excision  06/25/2012   Procedure: EXCISION MASS;  Surgeon: Wynonia Sours, MD;  Location: [facility];  Service: Orthopedics;  Laterality: Left;  transection of NEUROMA, BURYING RADIAL NERVE IN BRACHIORADIALIS LEFT SIDE     Current Outpatient Prescriptions  Medication Sig Dispense Refill  . amLODipine (NORVASC) 5 MG tablet Take 1 tablet (5 mg total) by mouth daily. 90 tablet 3  . aspirin 81 MG tablet Take 81 mg by mouth daily.    [name] atorvastatin (LIPITOR) 40 MG tablet TAKE ONE TABLET BY MOUTH ONE TIME DAILY 90 tablet 0  . hydrochlorothiazide (HYDRODIURIL) 25 MG tablet Take 1 tablet (25 mg total) by mouth daily. 90 tablet 1  . levothyroxine (SYNTHROID, LEVOTHROID) 100 MCG tablet Take 1 tablet (100 mcg total) by mouth daily. 90 tablet 1  . Melatonin 10 MG TABS Take by mouth. 10mg  one time at bedtime    . metoprolol tartrate (LOPRESSOR) 50 MG tablet Take 1 tablet (50 mg total) by mouth 2 (two) times daily. 180 tablet 3  . mometasone (NASONEX) 50 MCG/ACT nasal spray Place 2 sprays into the nose daily. 17 g 3  . nitroGLYCERIN (NITROSTAT) 0.4 MG SL tablet Place 1 tablet (0.4 mg total) under the tongue every 5 (five) minutes as needed for chest pain. 50 tablet 0  . omeprazole (PRILOSEC) 40 MG capsule Take  1 capsule (40 mg total) by mouth daily. 90 capsule 0   No current facility-administered medications for this visit.    Allergies:   Prednisone; Doxycycline hyclate; Penicillins; and Fentanyl    Social History:  The patient  reports that he quit smoking about 9 years ago. His smoking use included Cigarettes. He quit after 20 years of use. He has never used smokeless tobacco. He reports that he drinks alcohol. He reports that he does not use illicit drugs.   Family History:  The patient's family history includes Congestive Heart Failure in his mother; Heart attack (age of onset: 79) in his father; Heart failure (age of onset: 44) in his mother. There is no history of Colon cancer.    ROS:  Please see the  history of present illness.   Otherwise, review of systems are positive for none .   All other systems are reviewed and negative.    PHYSICAL EXAM: VS:  BP 122/67 mmHg  Pulse 52  Ht 5\' 10"  (1.778 m)  Wt 213 lb 2 oz (96.673 kg)  BMI 30.58 kg/m2 , BMI Body mass index is 30.58 kg/(m^2). GENERAL:  Well appearing HEENT:  Pupils equal round and reactive, fundi not visualized, oral mucosa unremarkable NECK:  No jugular venous distention, waveform within normal limits, carotid upstroke brisk and symmetric, no bruits, no thyromegaly LYMPHATICS:  No cervical, inguinal adenopathy LUNGS:  Clear to auscultation bilaterally BACK:  No CVA tenderness CHEST:  Unremarkable HEART:  PMI not displaced or sustained,S1 and S2 within normal limits, no S3, no S4, no clicks, no rubs, no murmurs ABD:  Flat, positive bowel sounds normal in frequency in pitch, no bruits, no rebound, no guarding, no midline pulsatile mass, no hepatomegaly, no splenomegaly EXT:  2 plus pulses throughout, no edema, no cyanosis no clubbing SKIN:  No rashes no nodules NEURO:  Cranial nerves II through XII grossly intact, motor grossly intact throughout PSYCH:  Cognitively intact, oriented to person place and time    EKG:  EKG is not ordered today.   Recent Labs: 05/18/2015: ALT 28; Platelets 323 11/09/2015: BUN 13; Creatinine, Ser 1.11; Magnesium 2.3; Potassium 4.0; Sodium 145*; TSH 2.120    Lipid Panel    Component Value Date/Time   CHOL 127 08/07/2013 1049   CHOL 122 04/28/2013 1505   CHOL 136 10/29/2012 0922   TRIG 83 08/07/2013 1049   TRIG 139 04/28/2013 1505   TRIG 103 10/29/2012 0922   HDL 45 08/07/2013 1049   HDL 44 04/28/2013 1505   HDL 41 10/29/2012 0922   HDL 65 05/01/2012 1033   CHOLHDL 2.8 08/07/2013 1049   LDLCALC 65 08/07/2013 1049   LDLCALC 50 04/28/2013 1505   LDLCALC 74 10/29/2012 0922   LDLCALC 84 05/01/2012 1033      Wt Readings from Last 3 Encounters:  12/01/15 213 lb 2 oz (96.673 kg)    11/24/15 211 lb (95.709 kg)  11/16/15 211 lb (95.709 kg)      Other studies Reviewed: Additional studies/ records that were reviewed today include: Echo and Lexiscan Myoview.. Review of the above records demonstrates:  Please see elsewhere in the note.     ASSESSMENT AND PLAN:  CHEST PAIN:  Given the abnormal stress test and ongoing symptoms he will have a cardiac catheterization. The patient understands that risks included but are not limited to stroke (1 in 1000), death (1 in 25), kidney failure [usually temporary] (1 in 500), bleeding (1 in 200), allergic reaction [possibly serious] (1  in 200).  The patient understands and agrees to proceed.   PALPITATIONS:  These seem to be somewhat mild and no change in therapy is planned at this point.   Current medicines are reviewed at length with the patient today.  The patient does not have concerns regarding medicines.  The following changes have been made:  no change  Labs/ tests ordered today include:   Orders Placed This Encounter  Procedures  . CBC  . Basic metabolic panel  . Protime-INR  . APTT  . LEFT HEART CATHETERIZATION WITH CORONARY ANGIOGRAM     Disposition:   FU with me after the cath.     Signed, Minus Breeding, MD  12/01/2015 5:13 PM    [facility]

## 2015-12-03 ENCOUNTER — Encounter (HOSPITAL_COMMUNITY): Payer: Self-pay

## 2015-12-03 DIAGNOSIS — R931 Abnormal findings on diagnostic imaging of heart and coronary circulation: Secondary | ICD-10-CM

## 2015-12-03 DIAGNOSIS — I2510 Atherosclerotic heart disease of native coronary artery without angina pectoris: Secondary | ICD-10-CM

## 2015-12-03 DIAGNOSIS — I25119 Atherosclerotic heart disease of native coronary artery with unspecified angina pectoris: Secondary | ICD-10-CM

## 2015-12-03 DIAGNOSIS — E039 Hypothyroidism, unspecified: Secondary | ICD-10-CM | POA: Diagnosis not present

## 2015-12-03 DIAGNOSIS — I25110 Atherosclerotic heart disease of native coronary artery with unstable angina pectoris: Secondary | ICD-10-CM | POA: Diagnosis not present

## 2015-12-03 DIAGNOSIS — R001 Bradycardia, unspecified: Secondary | ICD-10-CM | POA: Diagnosis not present

## 2015-12-03 DIAGNOSIS — I10 Essential (primary) hypertension: Secondary | ICD-10-CM

## 2015-12-03 DIAGNOSIS — I2584 Coronary atherosclerosis due to calcified coronary lesion: Secondary | ICD-10-CM | POA: Diagnosis not present

## 2015-12-03 DIAGNOSIS — I959 Hypotension, unspecified: Secondary | ICD-10-CM | POA: Diagnosis not present

## 2015-12-03 DIAGNOSIS — Z9861 Coronary angioplasty status: Secondary | ICD-10-CM

## 2015-12-03 HISTORY — DX: Atherosclerotic heart disease of native coronary artery without angina pectoris: I25.10

## 2015-12-03 LAB — BASIC METABOLIC PANEL
Anion gap: 11 (ref 5–15)
BUN: 10 mg/dL (ref 6–20)
CHLORIDE: 106 mmol/L (ref 101–111)
CO2: 23 mmol/L (ref 22–32)
Calcium: 9.3 mg/dL (ref 8.9–10.3)
Creatinine, Ser: 1.12 mg/dL (ref 0.61–1.24)
GFR calc non Af Amer: >60 mL/min (ref >60)
Glucose, Bld: 111 mg/dL — ABNORMAL HIGH (ref 65–99)
POTASSIUM: 3.9 mmol/L (ref 3.5–5.1)
SODIUM: 140 mmol/L (ref 135–145)

## 2015-12-03 LAB — TROPONIN I
TROPONIN I: 0.23 ng/mL — AB (ref <0.031)
TROPONIN I: 0.24 ng/mL — AB (ref <0.031)
TROPONIN I: 0.22 ng/mL — AB (ref <0.031)
Troponin I: 0.19 ng/mL — ABNORMAL HIGH (ref <0.031)
Troponin I: 0.28 ng/mL — ABNORMAL HIGH (ref <0.031)

## 2015-12-03 LAB — CBC
HEMATOCRIT: 43 % (ref 39.0–52.0)
HEMOGLOBIN: 14.7 g/dL (ref 13.0–17.0)
MCH: 30.9 pg (ref 26.0–34.0)
MCHC: 34.2 g/dL (ref 30.0–36.0)
MCV: 90.3 fL (ref 78.0–100.0)
Platelets: 187 10*3/uL (ref 150–400)
RBC: 4.76 MIL/uL (ref 4.22–5.81)
RDW: 13.3 % (ref 11.5–15.5)
WBC: 10.3 10*3/uL (ref 4.0–10.5)

## 2015-12-03 MED ORDER — DIPHENHYDRAMINE HCL 25 MG PO CAPS: 25.0000 mg | ORAL_CAPSULE | Freq: Three times a day (TID) | ORAL | Status: DC | PRN

## 2015-12-03 MED ORDER — ANGIOPLASTY BOOK
Freq: Once | Status: AC
  Administered 2015-12-03: 03:00:00
  Filled 2015-12-03: qty 1

## 2015-12-03 MED ORDER — ATORVASTATIN CALCIUM 80 MG PO TABS
80.0000 mg | ORAL_TABLET | Freq: Every day | ORAL | Status: DC
  Administered 2015-12-03: 80 mg via ORAL
  Filled 2015-12-03 (×2): qty 1

## 2015-12-03 NOTE — Progress Notes (Signed)
CARDIAC REHAB PHASE I   PRE:  Rate/Rhythm: 60 SR  BP:  Sitting: 128/54        SaO2: 99 RA  MODE:  Ambulation: 300 ft   POST:  Rate/Rhythm: 77 SR  BP:  Sitting: 138/63         SaO2: 98 RA  Pt ambulated 300 ft on RA, handheld assist, steady gait, tolerated well, no complaints, pt states he is hesitant to ambulate farther at this time following last nights events. Completed PCI/stent education.  Reviewed risk factors, anti-platelet therapy, stent card, activity restrictions, ntg, exercise, heart healthy diet, portion control, and phase 2 cardiac rehab. Pt verbalized understanding, receptive to education. Pt agrees to phase 2 cardiac rehab referral, will send to [facility] per pt request. Pt to see case manager regarding brilinta prior to discharge. Pt to recliner after walk, call bell within reach.   [id number] Lenna Sciara, RN, BSN 12/03/2015 8:54 AM

## 2015-12-03 NOTE — Progress Notes (Signed)
Patient has had no further episodes or symptoms this am. Report given to [place] and patient will be transferred to [id number].

## 2015-12-03 NOTE — Care Management Note (Signed)
Case Management Note  Patient Details  Name: TREYVEON MOCHIZUKI MRN: [id number] Date of Birth: 09/27/1960  Subjective/Objective:                  Chest Pain Action/Plan: Discharge planning Expected Discharge Date:  12/03/15               Expected Discharge Plan:  Home/Self Care  In-House Referral:     Discharge planning Services  CM Consult, Medication Assistance  Post Acute Care Choice:    Choice offered to:  Patient  DME Arranged:    DME Agency:     HH Arranged:    [place] Agency:     Status of Service:  Completed, signed off  Medicare Important Message Given:    Date Medicare IM Given:    Medicare IM give by:    Date Additional Medicare IM Given:    Additional Medicare Important Message give by:     If discussed at [place] of Stay Meetings, dates discussed:    Additional Comments: CM met with pt and gave free 30 day trial card for Brilinta.  Pt verbalized understanding this card will pay for today's discharge prescription and give insurance time to authorize medication for refills. No other CM needs were communicated. Dellie Catholic, RN 12/03/2015, 10:03 AM

## 2015-12-03 NOTE — Progress Notes (Signed)
SUBJECTIVE:  Had 2 episodes of CP last night associated with ? Vagal response with diaphoresis and bradycardia/hypotension.  Trop is elevated today at 0.28.  No further CP since last night.   OBJECTIVE:   Vitals:   Filed Vitals:   12/03/15 0500 12/03/15 0600 12/03/15 0700 12/03/15 0800  BP: 116/72 110/49 121/56 128/54  Pulse:      Temp:      TempSrc:      Resp: 20 12 12 12   Height:      Weight:      SpO2:       I&O's:   Intake/Output Summary (Last 24 hours) at 12/03/15 0839 Last data filed at 12/03/15 [id number]  Gross per 24 hour  Intake    740 ml  Output   1450 ml  Net   -710 ml   TELEMETRY: Reviewed telemetry pt in NSR:     PHYSICAL EXAM General: Well developed, well nourished, in no acute distress Head: Eyes PERRLA, No xanthomas.   Normal cephalic and atramatic  Lungs:   Clear bilaterally to auscultation and percussion. Heart:   HRRR S1 S2 Pulses are 2+ & equal. Abdomen: Bowel sounds are positive, abdomen soft and non-tender without masses Extremities:   No clubbing, cyanosis or edema.  DP +1 Neuro: Alert and oriented X 3. Psych:  Good affect, responds appropriately   LABS: Basic Metabolic Panel:  Recent Labs  12/01/15 1609 12/03/15 0300  NA 141 140  K 4.1 3.9  CL 101 106  CO2 29 23  GLUCOSE 160* 111*  BUN 15 10  CREATININE 1.09 1.12  CALCIUM 9.3 9.3   Liver Function Tests: No results for input(s): AST, ALT, ALKPHOS, BILITOT, PROT, ALBUMIN in the last 72 hours. No results for input(s): LIPASE, AMYLASE in the last 72 hours. CBC:  Recent Labs  12/01/15 1609 12/03/15 0300  WBC 8.0 10.3  HGB 14.7 14.7  HCT 43.1 43.0  MCV 88.3 90.3  PLT 208 187   Cardiac Enzymes:  Recent Labs  12/02/15 1945 12/03/15 0300  TROPONINI 0.09* 0.19*   BNP: Invalid input(s): POCBNP D-Dimer: No results for input(s): DDIMER in the last 72 hours. Hemoglobin A1C: No results for input(s): HGBA1C in the last 72 hours. Fasting Lipid Panel: No results for input(s):  CHOL, HDL, LDLCALC, TRIG, CHOLHDL, LDLDIRECT in the last 72 hours. Thyroid Function Tests: No results for input(s): TSH, T4TOTAL, T3FREE, THYROIDAB in the last 72 hours.  Invalid input(s): FREET3 Anemia Panel: No results for input(s): VITAMINB12, FOLATE, FERRITIN, TIBC, IRON, RETICCTPCT in the last 72 hours. Coag Panel:   Lab Results  Component Value Date   INR 0.93 12/01/2015    RADIOLOGY: No results found.  ASSESSMENT AND PLAN: Jeffrey Hooper is a 55 y.o. male who presented to office for evaluation of palpitations, chest pain and abnormal stress test. He was in his primary care office recently and EKG was obtained that suggested atrial fibrillation. However, on that day the EKG was reviewed by Dr. [name] and it was premature atrial contractions against normal sinus rhythm. There was no fibrillation. He did have a Holter monitor placed. This demonstrated runs of atrial tachycardia. The patients continued  to feel the palpitations. He was also having some chest discomfort and was admitted to [facility] for cath due to [facility] noting inferior wall ischemia.   1.  NSTEMI -  mild troponin bump in setting of CP last night.  ? Spasm vs. Vagal episode- no further CP.  BB decreased due  to bradycardia overnight with HR into 30's with diaphoresis. Tele today shows bradycardia all night so will stop BB for now. 2.  ASCAD with cath showing 95% mid LAD s/p PCI with DES with nonobstructive disease with 30% prox to mid RCA and 40% second mid LAD lesion.  He had CP last evening in setting of hypotension and bradycardia.  He says that the CP was more severe than what he has had in the past.  Currently pain free. Trop still trending upward.  Will continue to cycle trop.  He is on DAPT for 1 year. Continue high dose statin. 2.  Hyperlipidemia - increase atrovastatin tp 80mg  daily and will need repeat FLP and ALT in 6 weeks.  3.  Palpitations with history of atrial tachycardia - was on Toprol 50mg  daily but this is  now 12.5mg  BID due to bradycardia last night.  ? Whether bradycardia was from a vagal episode during CP as patient was diaphoretic with BP low and HR in 30's. He had one episode at 6pm and another at 8pm  No further episodes overnight.  .   Given 2 episodes of CP with bradycardia and hypotension last night, will continue to cycle trop and monitor another day.  If he has another episode of CP will need a relook cath today.  EKGs done during CP showed no ischemia.    Sueanne Margarita, MD  12/03/2015  8:39 AM

## 2015-12-04 DIAGNOSIS — I25110 Atherosclerotic heart disease of native coronary artery with unstable angina pectoris: Secondary | ICD-10-CM | POA: Diagnosis not present

## 2015-12-04 DIAGNOSIS — I10 Essential (primary) hypertension: Secondary | ICD-10-CM | POA: Diagnosis not present

## 2015-12-04 DIAGNOSIS — I959 Hypotension, unspecified: Secondary | ICD-10-CM | POA: Diagnosis not present

## 2015-12-04 DIAGNOSIS — R001 Bradycardia, unspecified: Secondary | ICD-10-CM | POA: Diagnosis not present

## 2015-12-04 DIAGNOSIS — I2510 Atherosclerotic heart disease of native coronary artery without angina pectoris: Secondary | ICD-10-CM | POA: Diagnosis not present

## 2015-12-04 DIAGNOSIS — E039 Hypothyroidism, unspecified: Secondary | ICD-10-CM | POA: Diagnosis not present

## 2015-12-04 DIAGNOSIS — R931 Abnormal findings on diagnostic imaging of heart and coronary circulation: Secondary | ICD-10-CM | POA: Diagnosis not present

## 2015-12-04 DIAGNOSIS — I2584 Coronary atherosclerosis due to calcified coronary lesion: Secondary | ICD-10-CM | POA: Diagnosis not present

## 2015-12-04 MED ORDER — ATORVASTATIN CALCIUM 80 MG PO TABS: 80.0000 mg | ORAL_TABLET | Freq: Every day | ORAL | Status: DC

## 2015-12-04 MED ORDER — TICAGRELOR 90 MG PO TABS: 90.0000 mg | ORAL_TABLET | Freq: Two times a day (BID) | ORAL | Status: DC

## 2015-12-04 MED ORDER — PANTOPRAZOLE SODIUM 40 MG PO TBEC: 40.0000 mg | DELAYED_RELEASE_TABLET | Freq: Every day | ORAL | Status: DC

## 2015-12-04 NOTE — Discharge Summary (Signed)
Discharge Summary    Patient ID: Jeffrey Hooper,  MRN: [id number], DOB/AGE: [birth year] 55 y.o.  Admit date: 12/02/2015 Discharge date: 12/04/2015  Primary Care Provider: [name] Primary Cardiologist: Dr. [name]  Discharge Diagnoses    Principal Problem:   Abnormal nuclear stress test Active Problems:   Essential hypertension   Chest pain   CAD (coronary artery disease), native coronary artery   Allergies Allergies  Allergen Reactions  . Prednisone Other (See Comments)    im depo medrol caused dizziness - pt passed out. Ask pt before giving (pt has taken since the reaction)  . Doxycycline Hyclate Hives and Itching  . Penicillins Hives    Has patient had a PCN reaction causing immediate rash, facial/tongue/throat swelling, SOB or lightheadedness with hypotension: Yes Has patient had a PCN reaction causing severe rash involving mucus membranes or skin necrosis: No Has patient had a PCN reaction that required hospitalization No Has patient had a PCN reaction occurring within the last 10 years: No If all of the above answers are "NO", then may proceed with Cephalosporin use.  . Fentanyl Rash    Reaction to patch    Diagnostic Studies/Procedures    NST 11/24/15 Study Highlights     Nuclear stress EF: 64%.  There was no ST segment deviation noted during stress.  Defect 1: There is a medium defect of moderate severity present in the basal inferior, basal inferolateral, mid inferoseptal, mid inferior, mid inferolateral and apical inferior location.  Findings consistent with ischemia.  This is an intermediate risk study.  There is a medium size, moderate severity reversible defect consistent with ischemia in the RCA territory. (SDS =6)   2D echo 11/25/15 Study Conclusions  - Left ventricle: The cavity size was normal. Systolic function was  normal. The estimated ejection fraction was in the range of 60%  to 65%. Wall motion was normal; there were no  regional wall  motion abnormalities. - Aortic valve: There was trivial regurgitation. - Mitral valve: There was mild regurgitation. - Left atrium: The atrium was mildly dilated.   LHC 12/02/15 Procedures    Coronary Stent Intervention   Left Heart Cath and Coronary Angiography    Conclusion     Prox RCA to Mid RCA lesion, 30% stenosed.  Mid LAD-1 lesion, 40% stenosed.  Mid LAD-2 lesion, 95% stenosed. Post intervention, there is a 15% residual stenosis.  1. Single vessel obstructive CAD involving the mid LAD 2. Successful stenting of the Mid LAD with DES.  Plan: DAPT for one year.       History of Present Illness     55 y/o male, followed by Dr. [name]. He has been recently followed for palpitations and CP. There was ? Regarding possible afib in his PCP office recently, however EKG was reviewed by Dr. [name] and rhythm was felt to be PACs vs NSR. There was no atrial fibrillation. He was also monitored with an ambulatory cardiac monitor which showed runs of atrial tachycardia but no afib. His chest pain was evaluated by nuclear stress testing on 4/201/17, which suggested a moderate sized defect in the inferior wall c/w ischemia. EF was well preserved by echo 11/25/15 at 60-65%.  He was seen in f/u by Dr. [name] in clinic on 12/01/15 who recommended definitive LHC given abnormal NST and continued symptoms.  Hospital Course     Patient presented to [facility] on 12/02/15 for the planned procedure, performed by Dr. Martinique. He was found to have single  vessel obstructive CAD involving the mid LAD. He underwent successful stenting of the mid LAD utilizing a DES. He tolerated the procedure well and left the cath lab in stable condition. He was placed on DAPT with ASA and Brilinta, high intensity statin, metoprolol and amlodipine. There were no immediate complications, however during overnight observation he developed 2 episodes of CP associated with diaphoresis and bradycardia/hypotension.  EKGs were preformed during CP episodes and showed no ischemia. Troponins were cycled x 3 and were mildly elevated with flat trend 0.23>>0.24>>0.22 and felt to be related to PCI. His BP normalized spontaneously. This was felt to be possible spasm vs vagal episode. His BB was discontinued due to bradycardia, HR in the 30s. He was monitored for an additional night and had no recurrent CP, hypotension nor bradycardia. He ambulated with cardiac rehab w/o difficulty. He was last seen and examined by Dr. Radford Pax, who determined he was stable for discharge home. He will f/u with Dr. [name] in clinic.   Consultants: none   Discharge Vitals Blood pressure 136/66, pulse 58, temperature 97.7 F (36.5 C), temperature source Oral, resp. rate 18, height 5\' 10"  (1.778 m), weight 209 lb 3.2 oz (94.892 kg), SpO2 97 %.  Filed Weights   12/03/15 0400 12/03/15 0431 12/04/15 0300  Weight: 212 lb 15.4 oz (96.6 kg) 211 lb 3.2 oz (95.8 kg) 209 lb 3.2 oz (94.892 kg)    Labs & Radiologic Studies    CBC  Recent Labs  12/01/15 1609 12/03/15 0300  WBC 8.0 10.3  HGB 14.7 14.7  HCT 43.1 43.0  MCV 88.3 90.3  PLT 208 [id number]   Basic Metabolic Panel  Recent Labs  12/01/15 1609 12/03/15 0300  NA 141 140  K 4.1 3.9  CL 101 106  CO2 29 23  GLUCOSE 160* 111*  BUN 15 10  CREATININE 1.09 1.12  CALCIUM 9.3 9.3   Cardiac Enzymes  Recent Labs  12/03/15 0938 12/03/15 1452 12/03/15 2100  TROPONINI 0.23* 0.24* 0.22*   _____________  No results found. Disposition   Pt is being discharged home today in good condition.  Follow-up Plans & Appointments    Follow-up Information    Follow up with Minus Breeding, MD.   Specialty:  Cardiology   Why:  our office will call you with f/u with Dr. Sofie Rower information:   [address] [place] [address] [phone]      Discharge Instructions    AMB Referral to Cardiac Rehabilitation - Phase II    Complete by:  As directed    Diagnosis:  PTCA     Amb Referral to Cardiac Rehabilitation    Complete by:  As directed   Diagnosis:  Coronary Stents           Discharge Medications   Current Discharge Medication List    START taking these medications   Details  pantoprazole (PROTONIX) 40 MG tablet Take 1 tablet (40 mg total) by mouth daily. Qty: 90 tablet, Refills: 3    ticagrelor (BRILINTA) 90 MG TABS tablet Take 1 tablet (90 mg total) by mouth 2 (two) times daily. Qty: 180 tablet, Refills: 3      CONTINUE these medications which have CHANGED   Details  atorvastatin (LIPITOR) 80 MG tablet Take 1 tablet (80 mg total) by mouth daily. Qty: 90 tablet, Refills: 3      CONTINUE these medications which have NOT CHANGED   Details  acetaminophen (TYLENOL) 500 MG tablet Take 1,000  mg by mouth every 6 (six) hours as needed (pain).     amLODipine (NORVASC) 5 MG tablet Take 1 tablet (5 mg total) by mouth daily. Qty: 90 tablet, Refills: 3   Associated Diagnoses: Essential hypertension    aspirin EC 81 MG tablet Take 81 mg by mouth at bedtime.    aspirin-acetaminophen-caffeine (EXCEDRIN MIGRAINE) 250-250-65 MG tablet Take 2 tablets by mouth every 6 (six) hours as needed for headache.    hydrochlorothiazide (HYDRODIURIL) 25 MG tablet Take 1 tablet (25 mg total) by mouth daily. Qty: 90 tablet, Refills: 1   Associated Diagnoses: Essential hypertension    levothyroxine (SYNTHROID, LEVOTHROID) 100 MCG tablet Take 1 tablet (100 mcg total) by mouth daily. Qty: 90 tablet, Refills: 1   Associated Diagnoses: Hypothyroidism, unspecified hypothyroidism type    Melatonin 10 MG TABS Take 10 mg by mouth at bedtime.     nitroGLYCERIN (NITROSTAT) 0.4 MG SL tablet Place 1 tablet (0.4 mg total) under the tongue every 5 (five) minutes as needed for chest pain. Qty: 50 tablet, Refills: 0      STOP taking these medications     metoprolol tartrate (LOPRESSOR) 50 MG tablet      mometasone (NASONEX) 50 MCG/ACT nasal spray        omeprazole (PRILOSEC) 40 MG capsule          Aspirin prescribed at discharge?  Yes High Intensity Statin Prescribed? (Lipitor 40-80mg  or Crestor 20-40mg ): Yes Beta Blocker Prescribed? No: bradycardia (HR in the 30s on low dose BB) For EF <40%, was ACEI/ARB Prescribed? No: EF normal ADP Receptor Inhibitor Prescribed? (i.e. Plavix etc.-Includes Medically Managed Patients): Yes For EF <40%, Aldosterone Inhibitor Prescribed? No: EF >40% Was EF assessed during THIS hospitalization? No: assessed as outpatient 11/25/15 Was Cardiac Rehab II ordered? (Included Medically managed Patients): No:    Outstanding Labs/Studies   None  Duration of Discharge Encounter   Greater than 30 minutes including physician time.  Signed, Almyra Deforest PA-C 12/04/2015, 3:32 PM  Agree with discharge summary as outlined by Almyra Deforest, PA with minor modifications.  Signed: Fransico Him, MD [facility] 12/04/2015

## 2015-12-04 NOTE — Progress Notes (Signed)
Patient Profile: 55 y/o male, followed by Dr. [name], admitted for elective LHC in the setting  CP, palpitations and an abnormal NST. S/p PCI for single vessel disease 12/02/15 (DES to mid LAD, nl EF). Monitored an additional night given post PCI CP, bradycardia and hypotension felt related to possible spasm vs vagal event.   Subjective: Feels better but still with a few "twinges" of CP. Tolerable. He denies any exertional CP. He also has brief spontaneous spells of dyspnea (may be Brilinta related). No exertional dyspnea. No syncope/ near syncope with his bradycardia.   Objective: Vital signs in last 24 hours: Temp:  [97.9 F (36.6 C)-98.5 F (36.9 C)] 98 F (36.7 C) (04/30 0300) Pulse Rate:  [48-73] 48 (04/30 0300) Resp:  [16-19] 16 (04/30 0300) BP: (120-125)/(60-68) 125/68 mmHg (04/30 0300) SpO2:  [97 %-100 %] 97 % (04/30 0300) Weight:  [209 lb 3.2 oz (94.892 kg)] 209 lb 3.2 oz (94.892 kg) (04/30 0300) Last BM Date: 12/02/15  Intake/Output from previous day: 04/29 0701 - 04/30 0700 In: -  Out: 650 [Urine:650] Intake/Output this shift:    Medications Current Facility-Administered Medications  Medication Dose Route Frequency Provider Last Rate Last Dose  . 0.9 %  sodium chloride infusion  250 mL Intravenous PRN Peter M Martinique, MD      . acetaminophen (TYLENOL) tablet 650 mg  650 mg Oral Q4H PRN Peter M Martinique, MD      . amLODipine (NORVASC) tablet 5 mg  5 mg Oral Daily Peter M Martinique, MD   5 mg at 12/03/15 0932  . aspirin chewable tablet 81 mg  81 mg Oral Daily Peter M Martinique, MD   81 mg at 12/03/15 0932  . atorvastatin (LIPITOR) tablet 80 mg  80 mg Oral q1800 Sueanne Margarita, MD   80 mg at 12/03/15 1655  . atropine 1 MG/10ML injection 0.5 mg  0.5 mg Intravenous PRN Minus Breeding, MD      . diphenhydrAMINE (BENADRYL) capsule 25 mg  25 mg Oral Q8H PRN Peter M Martinique, MD      . hydrochlorothiazide (HYDRODIURIL) tablet 25 mg  25 mg Oral Daily Peter M Martinique, MD   25 mg at  12/03/15 0932  . levothyroxine (SYNTHROID, LEVOTHROID) tablet 100 mcg  100 mcg Oral QAC breakfast Peter M Martinique, MD   100 mcg at 12/04/15 [id number]  . morphine 2 MG/ML injection 1 mg  1 mg Intravenous Q2H PRN Isaiah Serge, NP      . ondansetron [facility]) injection 4 mg  4 mg Intravenous Q6H PRN Peter M Martinique, MD   4 mg at 12/02/15 1744  . oxyCODONE-acetaminophen (PERCOCET/ROXICET) 5-325 MG per tablet 1-2 tablet  1-2 tablet Oral Q6H PRN Peter M Martinique, MD   2 tablet at 12/02/15 1523  . pantoprazole (PROTONIX) EC tablet 40 mg  40 mg Oral Daily Peter M Martinique, MD   40 mg at 12/03/15 0932  . sodium chloride flush (NS) 0.9 % injection 3 mL  3 mL Intravenous Q12H Peter M Martinique, MD   3 mL at 12/03/15 2200  . sodium chloride flush (NS) 0.9 % injection 3 mL  3 mL Intravenous PRN Peter M Martinique, MD      . ticagrelor [facility]) tablet 90 mg  90 mg Oral BID Peter M Martinique, MD   90 mg at 12/03/15 2336    PE: General appearance: alert, cooperative and no distress Neck: no carotid bruit and no JVD Lungs: clear to auscultation  bilaterally Heart: regular rate and rhythm, S1, S2 normal, no murmur, click, rub or gallop Extremities: no LEE Pulses: 2+ and symmetric Skin: warm and dry Neurologic: Grossly normal  Lab Results:   Recent Labs  12/01/15 1609 12/03/15 0300  WBC 8.0 10.3  HGB 14.7 14.7  HCT 43.1 43.0  PLT 208 187   BMET  Recent Labs  12/01/15 1609 12/03/15 0300  NA 141 140  K 4.1 3.9  CL 101 106  CO2 29 23  GLUCOSE 160* 111*  BUN 15 10  CREATININE 1.09 1.12  CALCIUM 9.3 9.3   PT/INR  Recent Labs  12/01/15 1609  LABPROT 12.6  INR 0.93   Cardiac Panel (last 3 results)  Recent Labs  12/03/15 0938 12/03/15 1452 12/03/15 2100  TROPONINI 0.23* 0.24* 0.22*    Studies/Results:  Procedures    Coronary Stent Intervention   Left Heart Cath and Coronary Angiography    Conclusion     Prox RCA to Mid RCA lesion, 30% stenosed.  Mid LAD-1 lesion, 40% stenosed.  Mid  LAD-2 lesion, 95% stenosed. Post intervention, there is a 15% residual stenosis.  1. Single vessel obstructive CAD involving the mid LAD 2. Successful stenting of the Mid LAD with DES.  Plan: DAPT for one year     Assessment/Plan  Principal Problem:   Abnormal nuclear stress test Active Problems:   Essential hypertension   Chest pain   CAD (coronary artery disease), native coronary artery   1. CAD: s/p LHC 12/02/15 revealing single vessel CAD s/p PCI + DES to mid LAD. NL EF by echo. Still with twinges of atypical CP but no exertional CP or dyspnea. Continue DAPT with ASA + Brilinta, high intensity statin and amlodipine. Intolerant to BBs given resting bradycardia with rates as low as the 30s. Rx PRN nitro at discharge. He will need to remain on DAPT for at least 1 year.   2. Abnormal Troponin: Troponin was checked post PCI given CP, hypotension and bradycardia. Nonischemic EKGs during CP episodes. Still with occasional mild atypical CP but no exertional symptoms. ? If mild, low level troponin's were due to PCI related enzyme leak.  3. Bradycardia: nocturnal rates as low as the 30s. Resting HR currently in the upper 50s. No syncope/ near syncope. Recent LHC showed only mild RCA disease with a 30% lesion. Recent TSH was WNL. Continue to hold BB.   4. Hypotension: resolved. ? Vagal response. BP is stable on HCTZ and amlodipine.   Brittainy M. Rosita Fire, PA-C 12/04/2015 8:27 AM

## 2015-12-04 NOTE — Discharge Instructions (Signed)
No driving for 24 hours. No lifting over 5 lbs for 1 week. No sexual activity for 1 week. Keep procedure site clean & dry. If you notice increased pain, swelling, bleeding or pus, call/return!  You may shower, but no soaking baths/hot tubs/pools for 1 week.  ° ° °

## 2015-12-05 ENCOUNTER — Telehealth: Payer: Self-pay | Admitting: Cardiology

## 2015-12-05 ENCOUNTER — Observation Stay (HOSPITAL_COMMUNITY)
Admission: EM | Admit: 2015-12-05 | Discharge: 2015-12-06 | Disposition: A | Discharge status: Home / Self Care | Payer: BLUE CROSS/BLUE SHIELD | Attending: Interventional Cardiology | Admitting: Interventional Cardiology

## 2015-12-05 ENCOUNTER — Emergency Department (HOSPITAL_COMMUNITY): Payer: BLUE CROSS/BLUE SHIELD

## 2015-12-05 ENCOUNTER — Encounter (HOSPITAL_COMMUNITY): Payer: Self-pay | Admitting: Cardiology

## 2015-12-05 ENCOUNTER — Ambulatory Visit: Payer: Self-pay | Admitting: Cardiology

## 2015-12-05 DIAGNOSIS — Y9389 Activity, other specified: Secondary | ICD-10-CM | POA: Diagnosis not present

## 2015-12-05 DIAGNOSIS — R0602 Shortness of breath: Secondary | ICD-10-CM | POA: Diagnosis not present

## 2015-12-05 DIAGNOSIS — G709 Myoneural disorder, unspecified: Secondary | ICD-10-CM | POA: Diagnosis not present

## 2015-12-05 DIAGNOSIS — E039 Hypothyroidism, unspecified: Secondary | ICD-10-CM | POA: Insufficient documentation

## 2015-12-05 DIAGNOSIS — R0789 Other chest pain: Secondary | ICD-10-CM | POA: Diagnosis not present

## 2015-12-05 DIAGNOSIS — R000 Tachycardia, unspecified: Secondary | ICD-10-CM | POA: Diagnosis not present

## 2015-12-05 DIAGNOSIS — M1990 Unspecified osteoarthritis, unspecified site: Secondary | ICD-10-CM | POA: Insufficient documentation

## 2015-12-05 DIAGNOSIS — I10 Essential (primary) hypertension: Secondary | ICD-10-CM | POA: Diagnosis not present

## 2015-12-05 DIAGNOSIS — Y998 Other external cause status: Secondary | ICD-10-CM | POA: Insufficient documentation

## 2015-12-05 DIAGNOSIS — Y9289 Other specified places as the place of occurrence of the external cause: Secondary | ICD-10-CM | POA: Diagnosis not present

## 2015-12-05 DIAGNOSIS — E785 Hyperlipidemia, unspecified: Secondary | ICD-10-CM | POA: Diagnosis not present

## 2015-12-05 DIAGNOSIS — L509 Urticaria, unspecified: Secondary | ICD-10-CM | POA: Diagnosis not present

## 2015-12-05 DIAGNOSIS — Z7982 Long term (current) use of aspirin: Secondary | ICD-10-CM | POA: Insufficient documentation

## 2015-12-05 DIAGNOSIS — R079 Chest pain, unspecified: Principal | ICD-10-CM | POA: Diagnosis present

## 2015-12-05 DIAGNOSIS — I25119 Atherosclerotic heart disease of native coronary artery with unspecified angina pectoris: Secondary | ICD-10-CM | POA: Insufficient documentation

## 2015-12-05 DIAGNOSIS — Z79899 Other long term (current) drug therapy: Secondary | ICD-10-CM | POA: Diagnosis not present

## 2015-12-05 DIAGNOSIS — Z8701 Personal history of pneumonia (recurrent): Secondary | ICD-10-CM | POA: Diagnosis not present

## 2015-12-05 DIAGNOSIS — N400 Enlarged prostate without lower urinary tract symptoms: Secondary | ICD-10-CM | POA: Insufficient documentation

## 2015-12-05 DIAGNOSIS — I499 Cardiac arrhythmia, unspecified: Secondary | ICD-10-CM | POA: Insufficient documentation

## 2015-12-05 DIAGNOSIS — X58XXXA Exposure to other specified factors, initial encounter: Secondary | ICD-10-CM | POA: Diagnosis not present

## 2015-12-05 DIAGNOSIS — Z880 Allergy status to penicillin: Secondary | ICD-10-CM | POA: Insufficient documentation

## 2015-12-05 DIAGNOSIS — T7840XA Allergy, unspecified, initial encounter: Secondary | ICD-10-CM | POA: Insufficient documentation

## 2015-12-05 DIAGNOSIS — I471 Supraventricular tachycardia: Secondary | ICD-10-CM | POA: Diagnosis not present

## 2015-12-05 DIAGNOSIS — K219 Gastro-esophageal reflux disease without esophagitis: Secondary | ICD-10-CM | POA: Insufficient documentation

## 2015-12-05 LAB — CBC
HEMATOCRIT: 46.8 % (ref 39.0–52.0)
Hemoglobin: 16.3 g/dL (ref 13.0–17.0)
MCH: 30.6 pg (ref 26.0–34.0)
MCHC: 34.8 g/dL (ref 30.0–36.0)
MCV: 88 fL (ref 78.0–100.0)
Platelets: 207 10*3/uL (ref 150–400)
RBC: 5.32 MIL/uL (ref 4.22–5.81)
RDW: 13.1 % (ref 11.5–15.5)
WBC: 10.5 10*3/uL (ref 4.0–10.5)

## 2015-12-05 LAB — BASIC METABOLIC PANEL
ANION GAP: 21 — AB (ref 5–15)
BUN: 14 mg/dL (ref 6–20)
CALCIUM: 9.9 mg/dL (ref 8.9–10.3)
CO2: 18 mmol/L — AB (ref 22–32)
Chloride: 104 mmol/L (ref 101–111)
Creatinine, Ser: 1.36 mg/dL — ABNORMAL HIGH (ref 0.61–1.24)
GFR calc Af Amer: >60 mL/min (ref >60)
GFR calc non Af Amer: 58 mL/min — ABNORMAL LOW (ref >60)
GLUCOSE: 117 mg/dL — AB (ref 65–99)
Potassium: 3.6 mmol/L (ref 3.5–5.1)
Sodium: 143 mmol/L (ref 135–145)

## 2015-12-05 LAB — TROPONIN I
Troponin I: 0.11 ng/mL — ABNORMAL HIGH (ref <0.031)
Troponin I: 0.14 ng/mL — ABNORMAL HIGH (ref <0.031)

## 2015-12-05 LAB — I-STAT TROPONIN, ED: Troponin i, poc: 0.09 ng/mL (ref 0.00–0.08)

## 2015-12-05 MED ORDER — MELATONIN 3 MG PO TABS
10.0000 mg | ORAL_TABLET | Freq: Every day | ORAL | Status: DC
  Administered 2015-12-05: 10.5 mg via ORAL
  Filled 2015-12-05: qty 3.5

## 2015-12-05 MED ORDER — AMLODIPINE BESYLATE 5 MG PO TABS: 5.0000 mg | ORAL_TABLET | Freq: Every day | ORAL | Status: DC

## 2015-12-05 MED ORDER — CLOPIDOGREL BISULFATE 75 MG PO TABS
150.0000 mg | ORAL_TABLET | Freq: Once | ORAL | Status: AC
  Administered 2015-12-05: 150 mg via ORAL
  Filled 2015-12-05: qty 2

## 2015-12-05 MED ORDER — PANTOPRAZOLE SODIUM 40 MG PO TBEC
40.0000 mg | DELAYED_RELEASE_TABLET | Freq: Every day | ORAL | Status: DC
  Administered 2015-12-06: 40 mg via ORAL
  Filled 2015-12-05: qty 1

## 2015-12-05 MED ORDER — LEVOTHYROXINE SODIUM 100 MCG PO TABS
100.0000 ug | ORAL_TABLET | Freq: Every day | ORAL | Status: DC
  Administered 2015-12-06: 100 ug via ORAL
  Filled 2015-12-05: qty 1

## 2015-12-05 MED ORDER — NITROGLYCERIN 0.4 MG SL SUBL: 0.4000 mg | SUBLINGUAL_TABLET | SUBLINGUAL | Status: DC | PRN

## 2015-12-05 MED ORDER — AMLODIPINE BESYLATE 5 MG PO TABS
5.0000 mg | ORAL_TABLET | Freq: Every day | ORAL | Status: DC
  Administered 2015-12-06: 5 mg via ORAL
  Filled 2015-12-05: qty 1

## 2015-12-05 MED ORDER — NITROGLYCERIN 0.4 MG SL SUBL
0.4000 mg | SUBLINGUAL_TABLET | SUBLINGUAL | Status: DC | PRN
  Administered 2015-12-05: 0.4 mg via SUBLINGUAL
  Filled 2015-12-05: qty 1

## 2015-12-05 MED ORDER — METOPROLOL TARTRATE 25 MG PO TABS: 12.5000 mg | ORAL_TABLET | Freq: Two times a day (BID) | ORAL | Status: DC

## 2015-12-05 MED ORDER — ACETAMINOPHEN 650 MG RE SUPP: 650.0000 mg | Freq: Four times a day (QID) | RECTAL | Status: DC | PRN

## 2015-12-05 MED ORDER — ASPIRIN EC 81 MG PO TBEC
81.0000 mg | DELAYED_RELEASE_TABLET | Freq: Every day | ORAL | Status: DC
  Administered 2015-12-05: 81 mg via ORAL
  Filled 2015-12-05: qty 1

## 2015-12-05 MED ORDER — DIPHENHYDRAMINE HCL 25 MG PO CAPS
50.0000 mg | ORAL_CAPSULE | Freq: Four times a day (QID) | ORAL | Status: DC | PRN
  Administered 2015-12-05 – 2015-12-06 (×2): 50 mg via ORAL
  Filled 2015-12-05 (×4): qty 2

## 2015-12-05 MED ORDER — CLOPIDOGREL BISULFATE 75 MG PO TABS
75.0000 mg | ORAL_TABLET | Freq: Every day | ORAL | Status: DC
  Administered 2015-12-06: 75 mg via ORAL
  Filled 2015-12-05: qty 1

## 2015-12-05 MED ORDER — ATORVASTATIN CALCIUM 80 MG PO TABS
80.0000 mg | ORAL_TABLET | Freq: Every day | ORAL | Status: DC
  Administered 2015-12-05 – 2015-12-06 (×2): 80 mg via ORAL
  Filled 2015-12-05 (×2): qty 1

## 2015-12-05 MED ORDER — ENOXAPARIN SODIUM 40 MG/0.4ML ~~LOC~~ SOLN
40.0000 mg | SUBCUTANEOUS | Status: DC
Start: 2015-12-05 — End: 2015-12-06
  Administered 2015-12-05: 40 mg via SUBCUTANEOUS
  Filled 2015-12-05: qty 0.4

## 2015-12-05 MED ORDER — ACETAMINOPHEN 325 MG PO TABS: 650.0000 mg | ORAL_TABLET | Freq: Four times a day (QID) | ORAL | Status: DC | PRN

## 2015-12-05 MED ORDER — METOPROLOL TARTRATE 12.5 MG HALF TABLET
12.5000 mg | ORAL_TABLET | Freq: Two times a day (BID) | ORAL | Status: DC
  Administered 2015-12-05 – 2015-12-06 (×2): 12.5 mg via ORAL
  Filled 2015-12-05 (×2): qty 1

## 2015-12-05 NOTE — ED Notes (Addendum)
He c/o chest tightness, sob, racing heart since 0300 today. He was just discharged from [facility] for stent placement. He has also developed red whelps to face, neck that are painless this afternoon. He is alert and breathing easily.

## 2015-12-05 NOTE — ED Notes (Signed)
Cardiology at bedside.

## 2015-12-05 NOTE — ED Provider Notes (Addendum)
CSN: [id number]     Arrival date & time 12/05/15  1412 History   First MD Initiated Contact with Patient 12/05/15 1439     Chief Complaint  Patient presents with  . Chest Pain      Patient is a 55 y.o. male presenting with chest pain. The history is provided by the patient.  Chest Pain Pain location:  Substernal area  patient presents with chest pain. States began around 3 in the morning last night. States he woke up and felt his heart racing in about 150 times a minute. States felt bad for around 2-3 minutes and improved somewhat. States he continued pain. It is some in his mid chest. Also developed a rash. States he has hives on his throat. Has had a decreased appetite some nausea also today. Was discharged from the hospital yesterday after a LAD stent. He was started on [place] at that time. He does not have that medicine before. Previous tachycardia that was thought to be irregular with PACs. No history of atrial fibrillation. Pain is dull and may feel like the pain he had before the stent.  Wife gave him some Benadryl after the rash developed.  Past Medical History  Diagnosis Date  . Hypertension   . Pulmonary nodule   . Hypothyroid   . GERD (gastroesophageal reflux disease)   . Prostatism   . Seasonal allergies   . Hyperlipidemia   . Multiple benign nevi   . Allergy   . Arthritis   . Coronary artery disease   . Anginal pain ([place])   . Dysrhythmia     "irregular and PAC'S"  . Pneumonia 2014, 2017  . Headache   . Neuromuscular disorder ([place]) 2011    LEFT RADIAL NERVE SURGERY R/T TRAUMA  . CAD (coronary artery disease), native coronary artery 12/03/2015   Past Surgical History  Procedure Laterality Date  . Back surgery      T12 - L1  . Spinal fusion      C6-7  . Tonsillectomy    . Vasectomy    . Sinus signs    . Radial nerve    . Mass excision  06/25/2012    Procedure: EXCISION MASS;  Surgeon: Wynonia Sours, MD;  Location: [facility] [facility];  Service:  Orthopedics;  Laterality: Left;  transection of NEUROMA, BURYING RADIAL NERVE IN BRACHIORADIALIS LEFT SIDE  . Coronary angioplasty    . Tonsillectomy  1982  . Cardiac catheterization N/A 12/02/2015    Procedure: Left Heart Cath and Coronary Angiography;  Surgeon: Peter M Martinique, MD;  Location: [place] [facility];  Service: Cardiovascular;  Laterality: N/A;  . Cardiac catheterization N/A 12/02/2015    Procedure: Coronary Stent Intervention;  Surgeon: Peter M Martinique, MD;  Location: [place] [facility];  Service: Cardiovascular;  Laterality: N/A;  mid LAD Promus 2.5x12   Family History  Problem Relation Age of Onset  . Heart attack Father 68    Died with MI  . Heart failure Mother 90    Died age 63  . Colon cancer Neg Hx   . Congestive Heart Failure Mother    Social History  Substance Use Topics  . Smoking status: Former Smoker -- 1.00 packs/day for 20 years    Types: Cigarettes    Quit date: 11/28/2006  . Smokeless tobacco: Never Used  . Alcohol Use: 0.0 oz/week    0 Standard drinks or equivalent per week     Comment: rarely    Review  of Systems  Cardiovascular: Positive for chest pain.      Allergies  Prednisone; Doxycycline hyclate; Penicillins; and Fentanyl  Home Medications   Prior to Admission medications   Medication Sig Start Date End Date Taking? Authorizing Provider  acetaminophen (TYLENOL) 500 MG tablet Take 1,000 mg by mouth every 6 (six) hours as needed (pain).    Yes Historical Provider, MD  amLODipine (NORVASC) 5 MG tablet Take 1 tablet (5 mg total) by mouth daily. 09/16/15  Yes Minus Breeding, MD  aspirin EC 81 MG tablet Take 81 mg by mouth at bedtime.   Yes Historical Provider, MD  aspirin-acetaminophen-caffeine (EXCEDRIN MIGRAINE) [phone] MG tablet Take 2 tablets by mouth every 6 (six) hours as needed for headache.   Yes Historical Provider, MD  atorvastatin (LIPITOR) 80 MG tablet Take 1 tablet (80 mg total) by mouth daily. 12/04/15  Yes Almyra Deforest, PA   diphenhydrAMINE (BENADRYL) 25 MG tablet Take 50 mg by mouth every 6 (six) hours as needed for itching.   Yes Historical Provider, MD  hydrochlorothiazide (HYDRODIURIL) 25 MG tablet Take 1 tablet (25 mg total) by mouth daily. 10/18/15  Yes [name], MD  levothyroxine (SYNTHROID, LEVOTHROID) 100 MCG tablet Take 1 tablet (100 mcg total) by mouth daily. 10/18/15  Yes [name], MD  Melatonin 10 MG TABS Take 10 mg by mouth at bedtime.    Yes Historical Provider, MD  pantoprazole (PROTONIX) 40 MG tablet Take 1 tablet (40 mg total) by mouth daily. 12/04/15  Yes Almyra Deforest, PA  ticagrelor (BRILINTA) 90 MG TABS tablet Take 1 tablet (90 mg total) by mouth 2 (two) times daily. 12/04/15  Yes Almyra Deforest, PA  nitroGLYCERIN (NITROSTAT) 0.4 MG SL tablet Place 1 tablet (0.4 mg total) under the tongue every 5 (five) minutes as needed for chest pain. 11/10/15   Fransisca Kaufmann Dettinger, MD   BP 156/68 mmHg  Pulse 94  Temp(Src) 98.2 F (36.8 C) (Oral)  Resp 17  SpO2 98% Physical Exam  Constitutional: He appears well-developed.  HENT:  Left Ear: External ear normal.  Eyes: Pupils are equal, round, and reactive to light.  Neck: Neck supple.  Cardiovascular: Normal rate.   Pulmonary/Chest: Effort normal.  Abdominal: Soft.  Musculoskeletal: Normal range of motion.  Neurological: He is alert.  Skin:  Some redness to the face. Some hives in the eyebrow area slightly on the jaw and on the upper neck.    ED Course  Procedures (including critical care time) [place], ED - Abnormal; Notable for the following:    Troponin i, poc 0.09 (*)    All other components within normal limits  CBC  BASIC METABOLIC PANEL  TROPONIN I    Imaging Review Dg Chest 2 View  12/05/2015  CLINICAL DATA:  Shortness of breath with tachycardia and chest tightness EXAM: CHEST  2 VIEW COMPARISON:  October 05, 2015 FINDINGS: There is no edema or consolidation. The heart size and pulmonary vascularity  are normal. No adenopathy. There old healed rib fractures on the right. There is degenerative change in the lower thoracic region, stable. There is postoperative change in the lower cervical spine region. No pneumothorax. IMPRESSION: No edema or consolidation. Electronically Signed   By: Lowella Grip III M.D.   On: 12/05/2015 15:46   I have personally reviewed and evaluated these images and lab results as part of my medical decision-making.   EKG Interpretation   Date/Time:  Monday Dec 05 2015  14:18:38 EDT Ventricular Rate:  96 PR Interval:  160 QRS Duration: 94 QT Interval:  358 QTC Calculation: 452 R Axis:   81 Text Interpretation:  Normal sinus rhythm Nonspecific ST abnormality  Confirmed by Alvino Chapel  MD, Laniya Friedl [phone]) on 12/05/2015 2:40:39 PM    EKG Interpretation  Date/Time:  Monday Dec 05 2015 16:09:38 EDT Ventricular Rate:  73 PR Interval:  173 QRS Duration: 93 QT Interval:  404 QTC Calculation: 445 R Axis:   59 Text Interpretation:  Sinus rhythm ST changes improved. Confirmed by Alvino Chapel  MD, Ovid Curd [phone]) on 12/05/2015 4:14:47 PM         MDM   Final diagnoses:  Chest pain, unspecified chest pain type  Allergic reaction to drug   patient with chest pain. EKG shows slight ST changes. Recent heart cath. Troponin is minimally elevated at 0.09. Also has rash that may be secondary to medication reaction. Medicine is Brilinta. To be seen by cardiology.  Davonna Belling, MD 12/05/15 [place], MD 12/05/15 [phone]

## 2015-12-05 NOTE — Telephone Encounter (Signed)
Jeffrey Hooper states that he just got home from the hospital on last evening and his heart was 154 and this morning it was 105 , it has gone down a little , but wants to know is there something else he should do or know . Please call   Thanks

## 2015-12-05 NOTE — H&P (Signed)
Name: Jeffrey Hooper is a 55 y.o. male Admit date: 12/05/2015 Referring Physician:  Dr. Alvino Chapel Primary Physician: Dr. Evette Doffing  Primary Cardiologist:  Dr. [name]  Reason for Consultation:  Chest pain and hives  ASSESSMENT: 55 y/o male w/ PMHX of GERD, hypothyroidism, HTN, and CAD with recent DES to mid LAD on 4/28 who presents with chest pain, palpitations, and hives that started last night. Pt has struggled w/ chronic chest palpitations ultimately leading to a stress test that was abnormal and a cardiac cath on 4/28 w/ DES placed to mid LAD. He was still having chest palpitations and chest discomfort after his cath procedure as well as bradycardia resulting in stopping his beta blocker. He admits that the only new symptoms since discharge yesterday is his skin hives. He is noted to be tachycardiac on EKG obtained in the ED, but HR is in the 70's-80's on exam. Questionable anxiety a component of pt's presentation, however his beta blocker was recently d/c with possible BB w/d side effects. He has also developed new hives that could be due to newly started brilinta which would need observation while this medication clears from his system.   PLAN: 1. Chest pain w/ tachycardia-- likely due to beta blocker withdrawal. He was on metoprolol 50mg  BID that was stopped on d/c yesterday due to bradycardia. His bradycardia seems like a vasovagal response, likely he will tolerate continuation of his BB.  - admit to tele - resume metoprolol 12.5mg  BID  2. Hives-- his only new medication is brilinta which has a <1% of causing skin rash. He is not having any difficulty protecting his airway. - discontinue brilinta and observe overnight - benadryl 50mg  q6hr prn for burning or itching of hives - can consider starting daily zyrtec on discharge for hx of seasonal allergies 3. CAD s/p DES to mid LAD - d/c brilinta due to #2 and start plavix 150mg  once, then plavix 75mg  daily - continue asa and statin 4.  HTN - continue norvasc 5mg     HPI: Pt is a 55 year old male w/ PMHx of HTN, hypothyroidism, GERD, seasonal allergies, CAD w/ recent cardiac cath on 4/28 for symptomatic chest palpitations and an abnormal stress test, who presented to the ED with chest pain, palpitations, and hives. He noticed an elevated HR of 155 last night at 3:00am that last 3-4 minutes, then at 8:00am he noticed an elevated HR of 105 both checked by his home pulse oximetry device. During these episodes of tachycardia he was having chest pain that felt like pressure and "thumps" as well as palpitations. Chest pain is located underneath his left breast and will radiate to the right side and left side of his chest. He has associated nausea, SOB, and diaphoresis with tachycardia. He also noticed hives on his face, neck, and anterior chest at 1:00am last night, he immediately took 50mg  of benadryl without much improvement. His wife states on the drive to the ED his hive were worse than they were currently in the ED room. He denies difficulty swallowing or breathing. He has seasonal allergies and does not take a daily antihistamine. He denies any new food, skin products, and soaps.   He was discharged yesterday, 4/30 after his cardiac cath on 4/28 w/ resulting DES to mid LAD. He was started on brilinta, which is his only new medication. His beta blocker was also discontinued due to bradycardia during his admission. Per chart review bradycardia was due to questionable vasovagal episodes as he was diaphoretic  w/ low BP and HR in the 30's. He was on toprol 50mg  BID that was decreased to 12.5mg  BID, then ultimately stopped over the course of 2 days. He reports being his on BB for at least 5 months. In the ED his CXR was negative and troponin was elevated at 0.11 down from 0.22 on 12/03/15.   PMH:   Past Medical History  Diagnosis Date  . Hypertension   . Pulmonary nodule   . Hypothyroid   . GERD (gastroesophageal reflux disease)   .  Prostatism   . Seasonal allergies   . Hyperlipidemia   . Multiple benign nevi   . Allergy   . Arthritis   . Coronary artery disease   . Anginal pain ([place])   . Dysrhythmia     "irregular and PAC'S"  . Pneumonia 2014, 2017  . Headache   . Neuromuscular disorder ([place]) 2011    LEFT RADIAL NERVE SURGERY R/T TRAUMA  . CAD (coronary artery disease), native coronary artery 12/03/2015    PSH:   Past Surgical History  Procedure Laterality Date  . Back surgery      T12 - L1  . Spinal fusion      C6-7  . Tonsillectomy    . Vasectomy    . Sinus signs    . Radial nerve    . Mass excision  06/25/2012    Procedure: EXCISION MASS;  Surgeon: Wynonia Sours, MD;  Location: [facility];  Service: Orthopedics;  Laterality: Left;  transection of NEUROMA, BURYING RADIAL NERVE IN BRACHIORADIALIS LEFT SIDE  . Coronary angioplasty    . Tonsillectomy  1982  . Cardiac catheterization N/A 12/02/2015    Procedure: Left Heart Cath and Coronary Angiography;  Surgeon: Peter M Martinique, MD;  Location: [facility];  Service: Cardiovascular;  Laterality: N/A;  . Cardiac catheterization N/A 12/02/2015    Procedure: Coronary Stent Intervention;  Surgeon: Peter M Martinique, MD;  Location: [facility];  Service: Cardiovascular;  Laterality: N/A;  mid LAD Promus 2.5x12   Allergies:  Prednisone; Doxycycline hyclate; Penicillins; and Fentanyl Prior to Admit Meds:   (Not in a hospital admission) Fam HX:    Family History  Problem Relation Age of Onset  . Heart attack Father 45    Died with MI  . Heart failure Mother 49    Died age 4  . Colon cancer Neg Hx   . Congestive Heart Failure Mother    Social HX:    Social History   Social History  . Marital Status: Married    Spouse Name: N/A  . Number of Children: 3  . Years of Education: N/A   Occupational History  . [employer] [place] History Main Topics  . Smoking status: Former Smoker -- 1.00 packs/day for 20  years    Types: Cigarettes    Quit date: 11/28/2006  . Smokeless tobacco: Never Used  . Alcohol Use: 0.0 oz/week    0 Standard drinks or equivalent per week     Comment: rarely  . Drug Use: No  . Sexual Activity: Yes   Other Topics Concern  . Not on file   Social History Narrative   Lives at home with wife, new 60 year old adopted boy.       Review of Systems: Pertinent ROS noted in HPI. All other systems are negative.  Physical Exam: Blood pressure 144/87, pulse 110, temperature 98.2 F (36.8  C), temperature source Oral, resp. rate 14, SpO2 95 %. Weight change:   Physical Exam  Constitutional: He appears well-developed and well-nourished. No distress.  HENT:  Head: Normocephalic and atraumatic.  Nose: Nose normal.  Mouth/Throat: Oropharynx is clear and moist. No oropharyngeal exudate.  Eyes: Conjunctivae and EOM are normal. Left eye exhibits no discharge. No scleral icterus.  Cardiovascular: Normal rate, regular rhythm and normal heart sounds.  Exam reveals no gallop and no friction rub.   No murmur heard. Pulmonary/Chest: Effort normal and breath sounds normal. No respiratory distress. He has no wheezes. He has no rales.  Abdominal: Soft. Bowel sounds are normal. He exhibits no distension and no mass. There is no tenderness. There is no rebound and no guarding.  Skin: Skin is warm and dry. Rash noted-multiple well demarcated raised wheals on b/l sides of neck. He is not diaphoretic. No pallor.    Labs: Lab Results  Component Value Date   WBC 10.5 12/05/2015   HGB 16.3 12/05/2015   HCT 46.8 12/05/2015   MCV 88.0 12/05/2015   PLT 207 12/05/2015    Recent Labs Lab 12/05/15 1429  NA 143  K 3.6  CL 104  CO2 18*  BUN 14  CREATININE 1.36*  CALCIUM 9.9  GLUCOSE 117*   No results found for: PTT Lab Results  Component Value Date   INR 0.93 12/01/2015   Lab Results  Component Value Date   TROPONINI 0.11* 12/05/2015     Lab Results  Component Value Date    CHOL 127 08/07/2013   CHOL 122 04/28/2013   CHOL 136 10/29/2012   Lab Results  Component Value Date   HDL 45 08/07/2013   HDL 44 04/28/2013   HDL 41 10/29/2012   Lab Results  Component Value Date   LDLCALC 65 08/07/2013   LDLCALC 50 04/28/2013   LDLCALC 74 10/29/2012   Lab Results  Component Value Date   TRIG 83 08/07/2013   TRIG 139 04/28/2013   TRIG 103 10/29/2012   Lab Results  Component Value Date   CHOLHDL 2.8 08/07/2013     Radiology:  Dg Chest 2 View  12/05/2015  CLINICAL DATA:  Shortness of breath with tachycardia and chest tightness EXAM: CHEST  2 VIEW COMPARISON:  October 05, 2015 FINDINGS: There is no edema or consolidation. The heart size and pulmonary vascularity are normal. No adenopathy. There old healed rib fractures on the right. There is degenerative change in the lower thoracic region, stable. There is postoperative change in the lower cervical spine region. No pneumothorax. IMPRESSION: No edema or consolidation. Electronically Signed   By: Lowella Grip III M.D.   On: 12/05/2015 15:46    EKG:  NSR, VR 96, J point elevation in II, III, aVF. Neg for PR depression or signs of ischemia.     Julious Oka 12/05/2015 5:10 PM    See my prior annotation under H and P.

## 2015-12-05 NOTE — H&P (Addendum)
The patient has been seen in conjunction with Julious Oka, M.D. All aspects of care have been considered and discussed. The patient has been personally interviewed, examined, and all clinical data has been reviewed.   The patient returns with multiple complaints including palpitations with heart rates up to 150 bpm (likely related to beta blocker withdrawal) and urticaria.  Discharged from the hospital 36 hours ago after receiving LAD stent. The only new/changed medications were Brilinta (added), metoprolol (discontinued), and atorvastatin (increased from 40-80 mg).  Suspect he is having rebound tachycardia related to sudden beta blocker withdrawal. Hives are concerning and I assume it is related to [place]. Elevated troponin is due to intervention last week.  Plan will be to switch from Brilinta to Plavix. We will give 150 mg of Plavix this evening and then use 75 mg daily.  We will treat hives symptomatically.  We will cycle cardiac markers.  Hopeful discharge in a.m. after overnight observation.

## 2015-12-05 NOTE — ED Notes (Addendum)
Pt. Experiencing episodes of sinus tachycardia with sharp chest pains. EDP made aware. Cardiology paged again.

## 2015-12-05 NOTE — ED Notes (Signed)
EDP at bedside  

## 2015-12-05 NOTE — Telephone Encounter (Signed)
Spoke with pt who states he had HR of 154 at 0300 during the night that lasted about 5 minutes. He said it felt like his heart was pounding. After that he laid back down and went back to sleep. At 0800, HR went up to 105. Currently, HR is around 78, BP 141/71, 98% on RA. He says he has slight SOB, CP that comes and goes quickly as a "shooting pain." He says he has a headache and feels "jittery." He denies pain in his left arm.  These findings taken to Dr Ellyn Hack, DOD. He advised for the pt to come in for an appt today. Appt scheduled with Dr. Ellyn Hack at 1515 today.  Pt notified and verbalized understanding. Pt verbalized understanding to go to the emergency room or call 911 if the chest pain worsens and he develops other symptoms, such as SOB, nausea, vomiting, sweating, or shooting pain down his left arm.

## 2015-12-06 DIAGNOSIS — I471 Supraventricular tachycardia: Secondary | ICD-10-CM

## 2015-12-06 DIAGNOSIS — R079 Chest pain, unspecified: Secondary | ICD-10-CM | POA: Diagnosis not present

## 2015-12-06 DIAGNOSIS — L509 Urticaria, unspecified: Secondary | ICD-10-CM | POA: Diagnosis not present

## 2015-12-06 LAB — TROPONIN I
TROPONIN I: 0.1 ng/mL — AB (ref <0.031)
Troponin I: 0.12 ng/mL — ABNORMAL HIGH (ref <0.031)

## 2015-12-06 MED ORDER — CLOPIDOGREL BISULFATE 75 MG PO TABS: 75.0000 mg | ORAL_TABLET | Freq: Every day | ORAL | Status: DC

## 2015-12-06 MED ORDER — METOPROLOL TARTRATE 25 MG PO TABS: 12.5000 mg | ORAL_TABLET | Freq: Two times a day (BID) | ORAL | Status: DC

## 2015-12-06 NOTE — Progress Notes (Signed)
Patient Name: Jeffrey Hooper Date of Encounter: 12/06/2015    SUBJECTIVE:Pt states he feels better than he did yesterday. Had 2 episodes of palpitations last night but states they were not as strong as they were previously before admission. Denies SOB.   TELEMETRY:  NSR, episodes of bradycardia down to the 40's Filed Vitals:   12/05/15 2000 12/05/15 2211 12/06/15 0541 12/06/15 0753  BP:  126/82 122/82 141/80  Pulse:  93 63 57  Temp:   98.2 F (36.8 C) 98.2 F (36.8 C)  TempSrc:   Oral Oral  Resp:   16 15  Height:      Weight: 204 lb 9.6 oz (92.806 kg)  204 lb (92.534 kg)   SpO2:   99% 100%    Intake/Output Summary (Last 24 hours) at 12/06/15 0823 Last data filed at 12/06/15 0716  Gross per 24 hour  Intake      0 ml  Output    300 ml  Net   -300 ml   LABS: Basic Metabolic Panel:  Recent Labs  12/05/15 1429  NA 143  K 3.6  CL 104  CO2 18*  GLUCOSE 117*  BUN 14  CREATININE 1.36*  CALCIUM 9.9   CBC:  Recent Labs  12/05/15 1429  WBC 10.5  HGB 16.3  HCT 46.8  MCV 88.0  PLT 207   Cardiac Enzymes:  Recent Labs  12/05/15 1429 12/05/15 1828 12/06/15 0014  TROPONINI 0.11* 0.14* 0.12*    Physical Exam: Blood pressure 141/80, pulse 57, temperature 98.2 F (36.8 C), temperature source Oral, resp. rate 15, height 5\' 10"  (1.778 m), weight 204 lb (92.534 kg), SpO2 100 %. Weight change:   Wt Readings from Last 3 Encounters:  12/06/15 204 lb (92.534 kg)  12/04/15 209 lb 3.2 oz (94.892 kg)  12/01/15 213 lb 2 oz (96.673 kg)    Physical Exam  Constitutional: He appears well-developed and well-nourished. No distress.  HENT:  Head: Normocephalic and atraumatic.  Nose: Nose normal.  Eyes: Conjunctivae and EOM are normal. No scleral icterus.  Cardiovascular: Normal rate, regular rhythm and normal heart sounds.  Exam reveals no gallop and no friction rub.   No murmur heard. Pulmonary/Chest: Effort normal and breath sounds normal. No respiratory  distress. He has no wheezes. He has no rales.  Abdominal: Soft. Bowel sounds are normal. He exhibits no distension and no mass. There is no tenderness. There is no rebound and no guarding.  Skin: Skin is warm and dry. No rash noted. He is not diaphoretic. No erythema. No pallor.    ASSESSMENT: 55 y/o male w/ PMHX of GERD, hypothyroidism, HTN, and CAD with recent DES to mid LAD on 4/28 who presents with chest pain, palpitations, and hives. Beta blocker was recently d/c with possible BB w/d side effects. He developed new hives that could be due to newly started brilinta that have resolved w/ holding this med.   PLAN: 1. Beta blocker withdrawal-- resumed metoprolol 12.5mg  BID, HRs ranged from 40's-93. Troponins peaked at 0.14 and trended down to 0.12 likely post cath related. - continue metoprolol 12.5mg  BID on discharge - ambulate w/ RN to ensure to CP on exertion 2. Hives--resolved - will add brilinta to pt's allergy list.  - benadryl 50mg  q6hr prn for burning or itching of hives - can consider starting daily zyrtec on discharge for hx of seasonal allergies 3. CAD s/p DES to mid LAD - d/c brilinta due to #2  -  continue plavix 75mg  daily - continue asa and statin 4. HTN - continue norvasc 5mg   5. Dispo-- possible d/c home today after he ambulates w/ RN    Signed, Julious Oka 12/06/2015, 8:23 AM

## 2015-12-06 NOTE — Progress Notes (Signed)
No difficulty ambulating. Pt is also CP free

## 2015-12-06 NOTE — Progress Notes (Signed)
Patient arrived on unit and was weighed, assessed, VS taken and was given his dinner and his meds. Patient has no c/o pain at this time but has hives all over his neck, face, ear lobes and upper back, will medicate as per order wirh Benadryl and continue to monitor. Patient was concerned about the MD not ordering his HCTZ but his creatinine is elevated and will text page to make sure that was his reasoning and will continue to update him and his wife. Patient denies any chest pain at this time, will continue to monitor.

## 2015-12-06 NOTE — Discharge Summary (Signed)
Discharge Summary    Patient ID: Jeffrey Hooper,  MRN: [id number], DOB/AGE: 1960/08/07 55 y.o.  Admit date: 12/05/2015 Discharge date: 12/06/2015  Primary Care Provider: [name] Primary Cardiologist: Dr. [name]  Discharge Diagnoses    Active Problems:   Chest pain   Allergies Allergies  Allergen Reactions  . Prednisone Other (See Comments)    im depo medrol caused dizziness - pt passed out. Ask pt before giving (pt has taken since the reaction)  . Brilinta [Ticagrelor] Hives  . Doxycycline Hyclate Hives and Itching  . Penicillins Hives    Has patient had a PCN reaction causing immediate rash, facial/tongue/throat swelling, SOB or lightheadedness with hypotension: Yes Has patient had a PCN reaction causing severe rash involving mucus membranes or skin necrosis: No Has patient had a PCN reaction that required hospitalization No Has patient had a PCN reaction occurring within the last 10 years: No If all of the above answers are "NO", then may proceed with Cephalosporin use.  . Fentanyl Rash    Reaction to patch    Diagnostic Studies/Procedures    None    History of Present Illness   55 y/o male, followed by Dr. [name]. He has been recently followed for palpitations and CP. There was ? Regarding possible afib in his PCP office recently, however EKG was reviewed by Dr. [name] and rhythm was felt to be PACs vs NSR. There was no atrial fibrillation. He was also monitored with an ambulatory cardiac monitor which showed runs of atrial tachycardia but no afib. His chest pain was evaluated by nuclear stress testing on 4/201/17, which suggested a moderate sized defect in the inferior wall c/w ischemia. EF was well preserved by echo 11/25/15 at 60-65%. He was seen in f/u by Dr. [name] in clinic on 12/01/15 who recommended definitive LHC given abnormal NST and continued symptoms.  Patient presented to [facility] on [facility]15 for the planned procedure, performed by Dr. Martinique. He  was found to have single vessel obstructive CAD involving the mid LAD. He underwent successful stenting of the mid LAD utilizing a DES. He tolerated the procedure well and left the cath lab in stable condition. He was placed on DAPT with ASA and Brilinta, high intensity statin, metoprolol and amlodipine. There were no immediate complications, however during overnight observation he developed 2 episodes of CP associated with diaphoresis and bradycardia/hypotension. EKGs were preformed during CP episodes and showed no ischemia. Troponins were cycled x 3 and were mildly elevated with flat trend 0.23>>0.24>>0.22 and felt to be related to PCI. His BP normalized spontaneously. This was felt to be possible spasm vs vagal episode. His BB was discontinued due to bradycardia, HR in the 30s. He was monitored for an additional night and had no recurrent CP, hypotension nor bradycardia. He ambulated with cardiac rehab w/o difficulty. He was last seen and examined by Dr. Radford Pax on 12/04/15, who determined he was stable for discharge home.  He presented back to [facility] one day later on 12/05/15 with complaints of palpitations/ rapid heart rate up into the 150s and urticaria. His medications were reviewed. The only new/ changed medications were Brilinta (added), metoprolol (discontinued), and atorvastatin (increased from 40-80 mg). He was seen by Dr. Tamala Julian. It was felt that he was having rebound tachycardia related to sudden beta blocker withdrawal. His hives were felt to be related to Brilinta, as this was the only new medication that was added. Decision was made to admit for observation and medication adjustment.  Hospital Course     Patient was admitted to telemetry. Brilinta  was discontinued and added to his allergy list.  This was replaced with Plavix. He was loaded with 150 mg x 1, followed by 75 mg daily. Benadryl was added PRN for symptomatic relief. Symptoms resolved. Dr. Tamala Julian decided to restart low dose beta blocker  therapy given his rebound tachycardia. He was placed on 12.5 mg of metoprolol BID. He was monitored closely on telemetry. HR remained stable w/o recurrent severe bradycardia.  Also of note, cardiac enzymes were cycled and were abnormal but drifting downward from prior admission, 0.14>>0.12>>0.10. This was felt to be residual from recent cath previous admit.   Patient ambulated with cardiac rehab and had no chest pain or dyspnea. HR was stable and urticaria had resolved. He was tolerating Plavix and low dose metoprolol well. He was last seen and examined by Dr. Tamala Julian, who determined he was stable for discharge home. Post hospital f/u has been arranged with Kerin Ransom, PA-C on 12/14/15. He will continue to be followed by Dr. [name].   Consultants: none   Discharge Vitals Blood pressure 141/80, pulse 57, temperature 98.2 F (36.8 C), temperature source Oral, resp. rate 15, height 5\' 10"  (1.778 m), weight 204 lb (92.534 kg), SpO2 100 %.  Filed Weights   12/05/15 2000 12/06/15 0541  Weight: 204 lb 9.6 oz (92.806 kg) 204 lb (92.534 kg)    Labs & Radiologic Studies    CBC  Recent Labs  12/05/15 1429  WBC 10.5  HGB 16.3  HCT 46.8  MCV 88.0  PLT [id number]   Basic Metabolic Panel  Recent Labs  12/05/15 1429  NA 143  K 3.6  CL 104  CO2 18*  GLUCOSE 117*  BUN 14  CREATININE 1.36*  CALCIUM 9.9   Liver Function Tests No results for input(s): AST, ALT, ALKPHOS, BILITOT, PROT, ALBUMIN in the last 72 hours. No results for input(s): LIPASE, AMYLASE in the last 72 hours. Cardiac Enzymes  Recent Labs  12/05/15 1828 12/06/15 0014 12/06/15 0828  TROPONINI 0.14* 0.12* 0.10*   BNP Invalid input(s): POCBNP D-Dimer No results for input(s): DDIMER in the last 72 hours. Hemoglobin A1C No results for input(s): HGBA1C in the last 72 hours. Fasting Lipid Panel No results for input(s): CHOL, HDL, LDLCALC, TRIG, CHOLHDL, LDLDIRECT in the last 72 hours. Thyroid Function Tests No results  for input(s): TSH, T4TOTAL, T3FREE, THYROIDAB in the last 72 hours.  Invalid input(s): FREET3 _____________  Dg Chest 2 View  12/05/2015  CLINICAL DATA:  Shortness of breath with tachycardia and chest tightness EXAM: CHEST  2 VIEW COMPARISON:  October 05, 2015 FINDINGS: There is no edema or consolidation. The heart size and pulmonary vascularity are normal. No adenopathy. There old healed rib fractures on the right. There is degenerative change in the lower thoracic region, stable. There is postoperative change in the lower cervical spine region. No pneumothorax. IMPRESSION: No edema or consolidation. Electronically Signed   By: Lowella Grip III M.D.   On: 12/05/2015 15:46   Disposition   Pt is being discharged home today in good condition.  Follow-up Plans & Appointments    Follow-up Information    Follow up with Erlene Quan, PA-C On 12/14/2015.   Specialties:  Cardiology, Radiology   Why:  2:30 PM (Cardiology follow-up)   Contact information:   [address] [place] [address] [phone]      Discharge Instructions    Diet - low sodium heart  healthy    Complete by:  As directed      Increase activity slowly    Complete by:  As directed            Discharge Medications   Current Discharge Medication List    START taking these medications   Details  clopidogrel (PLAVIX) 75 MG tablet Take 1 tablet (75 mg total) by mouth daily. Qty: 30 tablet, Refills: 11    metoprolol tartrate (LOPRESSOR) 25 MG tablet Take 0.5 tablets (12.5 mg total) by mouth 2 (two) times daily. Qty: 30 tablet, Refills: 5      CONTINUE these medications which have NOT CHANGED   Details  acetaminophen (TYLENOL) 500 MG tablet Take 1,000 mg by mouth every 6 (six) hours as needed (pain).     amLODipine (NORVASC) 5 MG tablet Take 1 tablet (5 mg total) by mouth daily. Qty: 90 tablet, Refills: 3   Associated Diagnoses: Essential hypertension    aspirin EC 81 MG tablet Take 81 mg by  mouth at bedtime.    aspirin-acetaminophen-caffeine (EXCEDRIN MIGRAINE) 250-250-65 MG tablet Take 2 tablets by mouth every 6 (six) hours as needed for headache.    atorvastatin (LIPITOR) 80 MG tablet Take 1 tablet (80 mg total) by mouth daily. Qty: 90 tablet, Refills: 3    diphenhydrAMINE (BENADRYL) 25 MG tablet Take 50 mg by mouth every 6 (six) hours as needed for itching.    hydrochlorothiazide (HYDRODIURIL) 25 MG tablet Take 1 tablet (25 mg total) by mouth daily. Qty: 90 tablet, Refills: 1   Associated Diagnoses: Essential hypertension    levothyroxine (SYNTHROID, LEVOTHROID) 100 MCG tablet Take 1 tablet (100 mcg total) by mouth daily. Qty: 90 tablet, Refills: 1   Associated Diagnoses: Hypothyroidism, unspecified hypothyroidism type    Melatonin 10 MG TABS Take 10 mg by mouth at bedtime.     pantoprazole (PROTONIX) 40 MG tablet Take 1 tablet (40 mg total) by mouth daily. Qty: 90 tablet, Refills: 3    nitroGLYCERIN (NITROSTAT) 0.4 MG SL tablet Place 1 tablet (0.4 mg total) under the tongue every 5 (five) minutes as needed for chest pain. Qty: 50 tablet, Refills: 0      STOP taking these medications     ticagrelor (BRILINTA) 90 MG TABS tablet          Outstanding Labs/Studies   none  Duration of Discharge Encounter   Greater than 30 minutes including physician time.  Signed, SIMMONS, BRITTAINY PA-C 12/06/2015, 10:14 AM    The patient has been seen in conjunction with Lyda Jester, PAC. All aspects of care have been considered and discussed. The patient has been personally interviewed, examined, and all clinical data has been reviewed.   Note is accurate as outlined.  Admitted with hives, possibly related to Brilinta.  Had tachycardia, felt due to beta blocker withdrawal.  There was no convincing evidence of ischemia/infarction, complicating recent stent.  He has a tendency towards anxiety.

## 2015-12-06 NOTE — Progress Notes (Signed)
   The overall findings were reviewed with Dr. Hulen Luster this morning.  Persistent/flat troponin elevation.  Monitor demonstrates no excessive bradycardia. Hives have resolved. Suspect that hives were related to recently instituted medication (Brilinta).   Ambulate and if no difficulty he will be eligible for discharge later today.  Brilinta was switched to Plavix. Tachycardia is likely related to beta blocker withdrawal. We have reinstituted low-dose beta blocker therapy. Follow-up in cardiology clinic as previously planned.

## 2015-12-06 NOTE — Progress Notes (Signed)
Pt discharged home. Discharge instructions have been gone over with the patient. IV's removed. Pt given unit number and told to call if they have any concerns regarding their discharge instructions. [name], RN   

## 2015-12-14 ENCOUNTER — Encounter: Payer: Self-pay | Admitting: Cardiology

## 2015-12-14 ENCOUNTER — Ambulatory Visit (INDEPENDENT_AMBULATORY_CARE_PROVIDER_SITE_OTHER): Payer: BLUE CROSS/BLUE SHIELD | Admitting: Cardiology

## 2015-12-14 ENCOUNTER — Telehealth: Payer: Self-pay | Admitting: Cardiology

## 2015-12-14 VITALS — BP 132/80 | HR 58 | Ht 70.0 in | Wt 206.4 lb

## 2015-12-14 DIAGNOSIS — I10 Essential (primary) hypertension: Secondary | ICD-10-CM

## 2015-12-14 DIAGNOSIS — R002 Palpitations: Secondary | ICD-10-CM

## 2015-12-14 DIAGNOSIS — K219 Gastro-esophageal reflux disease without esophagitis: Secondary | ICD-10-CM

## 2015-12-14 DIAGNOSIS — Z9861 Coronary angioplasty status: Secondary | ICD-10-CM

## 2015-12-14 DIAGNOSIS — T50905A Adverse effect of unspecified drugs, medicaments and biological substances, initial encounter: Secondary | ICD-10-CM | POA: Insufficient documentation

## 2015-12-14 DIAGNOSIS — I2510 Atherosclerotic heart disease of native coronary artery without angina pectoris: Secondary | ICD-10-CM | POA: Diagnosis not present

## 2015-12-14 DIAGNOSIS — T887XXD Unspecified adverse effect of drug or medicament, subsequent encounter: Secondary | ICD-10-CM | POA: Diagnosis not present

## 2015-12-14 DIAGNOSIS — T50905D Adverse effect of unspecified drugs, medicaments and biological substances, subsequent encounter: Secondary | ICD-10-CM

## 2015-12-14 DIAGNOSIS — F419 Anxiety disorder, unspecified: Secondary | ICD-10-CM | POA: Insufficient documentation

## 2015-12-14 DIAGNOSIS — N289 Disorder of kidney and ureter, unspecified: Secondary | ICD-10-CM | POA: Insufficient documentation

## 2015-12-14 DIAGNOSIS — E785 Hyperlipidemia, unspecified: Secondary | ICD-10-CM

## 2015-12-14 LAB — BASIC METABOLIC PANEL
BUN: 15 mg/dL (ref 7–25)
CO2: 29 mmol/L (ref 20–31)
Calcium: 9.8 mg/dL (ref 8.6–10.3)
Chloride: 101 mmol/L (ref 98–110)
Creat: 1.16 mg/dL (ref 0.70–1.33)
Glucose, Bld: 134 mg/dL — ABNORMAL HIGH (ref 65–99)
Potassium: 3.9 mmol/L (ref 3.5–5.3)
Sodium: 142 mmol/L (ref 135–146)

## 2015-12-14 MED ORDER — ESOMEPRAZOLE MAGNESIUM 40 MG PO CPDR: 40.0000 mg | DELAYED_RELEASE_CAPSULE | Freq: Every day | ORAL | Status: DC

## 2015-12-14 NOTE — Patient Instructions (Signed)
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

## 2015-12-14 NOTE — Assessment & Plan Note (Signed)
Readmitted 12/05/15 with rash- Brilinta changed to Plavix

## 2015-12-14 NOTE — Progress Notes (Signed)
12/14/2015 Jeffrey Hooper   Sep 20, 1960  [id number]  Primary Physician [name], MD Primary Cardiologist: Dr [name]  HPI:  55 y/o male, followed by Dr. [name]. He has been recently followed for palpitations and CP. There was ? Regarding possible afib in his PCP office recently, however EKG was reviewed by Dr. [name] and rhythm was felt to be PACs vs NSR. There was no atrial fibrillation. He was also monitored with an ambulatory cardiac monitor which showed runs of atrial tachycardia but no afib. His chest pain was evaluated by nuclear stress testing on 4/201/17, which suggested a moderate sized defect in the inferior wall c/w ischemia. EF was well preserved by echo 11/25/15 at 60-65%. He was seen in f/u by Dr. [name] in clinic on 12/01/15 who recommended definitive LHC given abnormal NST and continued symptoms.  Patient presented to [facility] on 12/02/15 for cath and was found to have single vessel obstructive CAD involving the mid LAD. He underwent successful PCI with a mid LAD DES.  He was placed on DAPT with ASA and Brilinta, high intensity statin, metoprolol and amlodipine. There were no immediate complications, however during overnight observation he developed 2 episodes of CP associated with diaphoresis and bradycardia/hypotension. EKGs were preformed during CP episodes and showed no ischemia. Troponins were cycled x 3 and were mildly elevated with flat trend 0.23>>0.24>>0.22 and felt to be related to PCI. His BP normalized spontaneously. The episodes were felt to be possible spasm vs vagal episode. His BB was discontinued due to bradycardia, HR in the 30s. He was monitored for an additional night and had no recurrent CP, hypotension nor bradycardia. He ambulated with cardiac rehab w/o difficulty. He was discharged 12/04/15.  He presented back to [facility] one day later on 12/05/15 with complaints of palpitations/ rapid heart rate up into the 150s and urticaria. His medications were reviewed. The  only new/ changed medications were Brilinta (added), metoprolol (discontinued), and atorvastatin (increased from 40-80 mg). He was seen by Dr. Tamala Julian. It was felt that he was having rebound tachycardia related to sudden beta blocker withdrawal. He was placed back on bet blocker and changed from Brilinta to Plavix secondary to rash.   He is in the office today for follow up. He is a little anxious but that apparently is chronic for him. He is back to work, he works for the [employer]. He has various chest pain complaints, none of which sound like angina. He has occasional palpitations and short lived tachycardia but its much improved since he resumed Lopressor.    Current Outpatient Prescriptions  Medication Sig Dispense Refill  . acetaminophen (TYLENOL) 500 MG tablet Take 1,000 mg by mouth every 6 (six) hours as needed (pain).     [name] amLODipine (NORVASC) 5 MG tablet Take 1 tablet (5 mg total) by mouth daily. 90 tablet 3  . aspirin EC 81 MG tablet Take 81 mg by mouth at bedtime.    [name] aspirin-acetaminophen-caffeine (EXCEDRIN MIGRAINE) 250-250-65 MG tablet Take 2 tablets by mouth every 6 (six) hours as needed for headache.    [name] atorvastatin (LIPITOR) 80 MG tablet Take 1 tablet (80 mg total) by mouth daily. 90 tablet 3  . clopidogrel (PLAVIX) 75 MG tablet Take 1 tablet (75 mg total) by mouth daily. 30 tablet 11  . diphenhydrAMINE (BENADRYL) 25 MG tablet Take 50 mg by mouth every 6 (six) hours as needed for itching.    . hydrochlorothiazide (HYDRODIURIL) 25 MG tablet Take  1 tablet (25 mg total) by mouth daily. 90 tablet 1  . levothyroxine (SYNTHROID, LEVOTHROID) 100 MCG tablet Take 1 tablet (100 mcg total) by mouth daily. 90 tablet 1  . Melatonin 10 MG TABS Take 10 mg by mouth at bedtime.     . metoprolol tartrate (LOPRESSOR) 25 MG tablet Take 0.5 tablets (12.5 mg total) by mouth 2 (two) times daily. 30 tablet 5  . nitroGLYCERIN (NITROSTAT) 0.4 MG SL tablet Place 1 tablet (0.4 mg total)  under the tongue every 5 (five) minutes as needed for chest pain. 50 tablet 0  . esomeprazole (NEXIUM) 40 MG capsule Take 1 capsule (40 mg total) by mouth daily at 12 noon. 90 capsule 3   No current facility-administered medications for this visit.    Allergies  Allergen Reactions  . Prednisone Other (See Comments)    im depo medrol caused dizziness - pt passed out. Ask pt before giving (pt has taken since the reaction)  . Brilinta [Ticagrelor] Hives  . Doxycycline Hyclate Hives and Itching  . Penicillins Hives    Has patient had a PCN reaction causing immediate rash, facial/tongue/throat swelling, SOB or lightheadedness with hypotension: Yes Has patient had a PCN reaction causing severe rash involving mucus membranes or skin necrosis: No Has patient had a PCN reaction that required hospitalization No Has patient had a PCN reaction occurring within the last 10 years: No If all of the above answers are "NO", then may proceed with Cephalosporin use.  . Fentanyl Rash    Reaction to patch    Social History   Social History  . Marital Status: Married    Spouse Name: N/A  . Number of Children: 3  . Years of Education: N/A   Occupational History  . [employer] History Main Topics  . Smoking status: Former Smoker -- 1.00 packs/day for 20 years    Types: Cigarettes    Quit date: 11/28/2006  . Smokeless tobacco: Never Used  . Alcohol Use: 0.0 oz/week    0 Standard drinks or equivalent per week     Comment: rarely  . Drug Use: No  . Sexual Activity: Yes   Other Topics Concern  . Not on file   Social History Narrative   Lives at home with wife, new 8 year old adopted boy.       Review of Systems: General: negative for chills, fever, night sweats or weight changes.  Cardiovascular: negative for chest pain, dyspnea on exertion, edema, orthopnea, palpitations, paroxysmal nocturnal dyspnea or shortness of breath Dermatological: negative for  rash Respiratory: negative for cough or wheezing Urologic: negative for hematuria Abdominal: negative for nausea, vomiting, diarrhea, bright red blood per rectum, melena, or hematemesis Neurologic: negative for visual changes, syncope, or dizziness All other systems reviewed and are otherwise negative except as noted above.    Blood pressure 132/80, pulse 58, height 5\' 10"  (1.778 m), weight 206 lb 6.4 oz (93.622 kg).  General appearance: alert, cooperative and no distress Neck: no carotid bruit and no JVD Lungs: clear to auscultation bilaterally Heart: regular rate and rhythm Extremities: extremities normal, atraumatic, no cyanosis or edema Neurologic: Grossly normal  EKG NSR, SB-58  ASSESSMENT AND PLAN:   CAD S/P percutaneous coronary angioplasty S/P LAD PCI with DES 12/02/15 after abnormal OP stress  Drug reaction Readmitted 12/05/15 with rash- Brilinta changed to Plavix  Essential hypertension Controlled  Palpitations Did not tolerate higher dose beta blocker secondary to bradycardia  He still has palpitations but less often and shorter duration  Dyslipidemia On high dose statin Rx  Renal insufficiency Slight bump in SCr post cath -1.36, will check BMP  Anxiety .  GERD (gastroesophageal reflux disease) Change Protonix to Nexium now that he is on Plavix   PLAN  Reassurance. Stop Protonix- start Nexium now that he is on Plavix. Check BMP as his SCr was slightly elevated at discharge. F/U Dr [name] in 3 months.   Kerin Ransom K PA-C 12/14/2015 3:25 PM

## 2015-12-14 NOTE — Assessment & Plan Note (Signed)
Did not tolerate higher dose beta blocker secondary to bradycardia He still has palpitations but less often and shorter duration

## 2015-12-14 NOTE — Telephone Encounter (Signed)
Returned call the pharmacy to let them know we are aware of interaction. Nexium is preferred over prilosec as this racemate appears to have less interaction. Unfortunately unable to use brilinta due to hives that developed.

## 2015-12-14 NOTE — Assessment & Plan Note (Signed)
Slight bump in SCr post cath -1.36, will check BMP

## 2015-12-14 NOTE — Assessment & Plan Note (Signed)
On high dose statin Rx 

## 2015-12-14 NOTE — Assessment & Plan Note (Signed)
S/P LAD PCI with DES 12/02/15 after abnormal OP stress

## 2015-12-14 NOTE — Telephone Encounter (Signed)
Received direct call to N. Rose, RN triage line from [place]  Patient has drug-drug interaction with plavix and nexium Upon chart review, appears this change was made today 12/14/15 Advised pharmacy staff that PA is aware of this, as he just changed his PPI from protonix to nexium Pharmacy staff states that most providers change to a different PPI based on this interaction Will defer to [place], [place] for advice

## 2015-12-14 NOTE — Assessment & Plan Note (Signed)
Change Protonix to Nexium now that he is on Plavix

## 2015-12-14 NOTE — Assessment & Plan Note (Signed)
Controlled.  

## 2015-12-29 ENCOUNTER — Ambulatory Visit: Payer: Self-pay | Admitting: Cardiology

## 2016-01-03 ENCOUNTER — Encounter (HOSPITAL_COMMUNITY)
Admission: RE | Admit: 2016-01-03 | Discharge: 2016-01-03 | Disposition: A | Discharge status: Home / Self Care | Payer: BLUE CROSS/BLUE SHIELD | Source: Ambulatory Visit | Attending: Cardiology | Admitting: Cardiology

## 2016-01-03 VITALS — BP 142/62 | HR 51 | Ht 69.0 in | Wt 211.2 lb

## 2016-01-03 DIAGNOSIS — Z955 Presence of coronary angioplasty implant and graft: Secondary | ICD-10-CM | POA: Insufficient documentation

## 2016-01-03 NOTE — Progress Notes (Signed)
Cardiac/Pulmonary Rehab Medication Review by a Pharmacist  Does the patient  feel that his/her medications are working for him/her?  yes  Has the patient been experiencing any side effects to the medications prescribed?  no  Does the patient measure his/her own blood pressure or blood glucose at home?  yes   Does the patient have any problems obtaining medications due to transportation or finances?   no  Understanding of regimen: good Understanding of indications: good Potential of compliance: excellent  Questions asked to Determine Patient Understanding of Medication Regimen:  1. What is the name of the medication?  2. What is the medication used for?  3. When should it be taken?  4. How much should be taken?  5. How will you take it?  6. What side effects should you report?  Understanding Defined as: Excellent: All questions above are correct Good: Questions 1-4 are correct Fair: Questions 1-2 are correct  Poor: 1 or none of the above questions are correct   Pharmacist comments: Pt does c/o some "welps" but does not know what is causing it.  Brilinta was stopped due to hives and rash.  Otherwise, pt does not c/o any other side effects.  Pt does check BP at home several times per week, recommended he write down results to show MD at appointments.    Hart Robinsons A 01/03/2016 3:46 PM

## 2016-01-03 NOTE — Progress Notes (Signed)
Cardiac Individual Treatment Plan  Patient Details  Name: GOVERNOR RAYGOZA MRN: [id number] Date of Birth: [birth year] Referring Provider:        CARDIAC REHAB PHASE II ORIENTATION from 01/03/2016 in [place]   Referring Provider  Dr. [name]      Initial Encounter Date:       CARDIAC REHAB PHASE II ORIENTATION from 01/03/2016 in [place]   Date  01/03/16   Referring Provider  Dr. [name]      Visit Diagnosis: Stented coronary artery  Patient's Home Medications on Admission:  Current outpatient prescriptions:  .  acetaminophen (TYLENOL) 500 MG tablet, Take 1,000 mg by mouth every 6 (six) hours as needed (pain). , Disp: , Rfl:  .  amLODipine (NORVASC) 5 MG tablet, Take 1 tablet (5 mg total) by mouth daily., Disp: 90 tablet, Rfl: 3 .  aspirin EC 81 MG tablet, Take 81 mg by mouth at bedtime., Disp: , Rfl:  .  atorvastatin (LIPITOR) 80 MG tablet, Take 1 tablet (80 mg total) by mouth daily. (Patient taking differently: Take 80 mg by mouth daily at 6 PM. ), Disp: 90 tablet, Rfl: 3 .  clopidogrel (PLAVIX) 75 MG tablet, Take 1 tablet (75 mg total) by mouth daily., Disp: 30 tablet, Rfl: 11 .  diphenhydrAMINE (BENADRYL) 25 MG tablet, Take 50 mg by mouth every 6 (six) hours as needed for itching., Disp: , Rfl:  .  esomeprazole (NEXIUM) 40 MG capsule, Take 1 capsule (40 mg total) by mouth daily at 12 noon. (Patient taking differently: Take 40 mg by mouth daily with breakfast. ), Disp: 90 capsule, Rfl: 3 .  hydrochlorothiazide (HYDRODIURIL) 25 MG tablet, Take 1 tablet (25 mg total) by mouth daily., Disp: 90 tablet, Rfl: 1 .  levothyroxine (SYNTHROID, LEVOTHROID) 100 MCG tablet, Take 1 tablet (100 mcg total) by mouth daily., Disp: 90 tablet, Rfl: 1 .  Melatonin 10 MG TABS, Take 10 mg by mouth at bedtime. , Disp: , Rfl:  .  metoprolol tartrate (LOPRESSOR) 25 MG tablet, Take 0.5 tablets (12.5 mg total) by mouth 2 (two) times daily., Disp: 30 tablet, Rfl:  5 .  nitroGLYCERIN (NITROSTAT) 0.4 MG SL tablet, Place 1 tablet (0.4 mg total) under the tongue every 5 (five) minutes as needed for chest pain., Disp: 50 tablet, Rfl: 0  Past Medical History: Past Medical History  Diagnosis Date  . Hypertension   . Pulmonary nodule   . Hypothyroid   . GERD (gastroesophageal reflux disease)   . Prostatism   . Seasonal allergies   . Hyperlipidemia   . Multiple benign nevi   . Allergy   . Arthritis   . Coronary artery disease   . Anginal pain ([place])   . Dysrhythmia     "irregular and PAC'S"  . Pneumonia 2014, 2017  . Headache   . Neuromuscular disorder ([place]) 2011    LEFT RADIAL NERVE SURGERY R/T TRAUMA  . CAD (coronary artery disease), native coronary artery 12/03/2015    Tobacco Use: History  Smoking status  . Former Smoker -- 1.00 packs/day for 20 years  . Types: Cigarettes  . Quit date: 11/28/2006  Smokeless tobacco  . Never Used    Labs:     Recent Review Flowsheet Data    Labs for ITP Cardiac and Pulmonary Rehab Latest Ref Rng 06/25/2012 10/29/2012 04/28/2013 08/07/2013 05/18/2015   Cholestrol 100 - 199 mg/dL - 136 122 127 -   LDLCALC 0 - 99 mg/dL - 74  50 65 -   HDL >39 mg/dL - 41 44 45 -   Trlycerides 0 - 149 mg/dL - 103 139 83 -   Hemoglobin A1c 4.8 - 5.6 % - - - - 5.8(H)   TCO2 0 - 100 mmol/L 27 - - - -      Capillary Blood Glucose: No results found for: GLUCAP   Exercise Target Goals: Date: 01/03/16  Exercise Program Goal: Individual exercise prescription set with THRR, safety & activity barriers. Participant demonstrates ability to understand and report RPE using BORG scale, to self-measure pulse accurately, and to acknowledge the importance of the exercise prescription.  Exercise Prescription Goal: Starting with aerobic activity 30 plus minutes a day, 3 days per week for initial exercise prescription. Provide home exercise prescription and guidelines that participant acknowledges understanding prior to  discharge.  Activity Barriers & Risk Stratification:     Activity Barriers & Cardiac Risk Stratification - 01/03/16 1757    Activity Barriers & Cardiac Risk Stratification   Activity Barriers None   Cardiac Risk Stratification High      6 Minute Walk:     6 Minute Walk      01/03/16 1802       6 Minute Walk   Phase Initial     Distance 1300 feet     Walk Time 6 minutes     # of Rest Breaks 0     MPH 2.46     METS 2.88     RPE 11     Perceived Dyspnea  12     VO2 Peak 12.2     Symptoms No     Resting HR 51 bpm     Resting BP 142/62 mmHg     Max Ex. HR 82 bpm     Max Ex. BP 144/74 mmHg     2 Minute Post BP 118/62 mmHg        Initial Exercise Prescription:     Initial Exercise Prescription - 01/03/16 1800    Date of Initial Exercise RX and Referring Provider   Date 01/03/16   Referring Provider Dr. [name]   Treadmill   MPH 1.5   Grade 0   Minutes 15   METs 2.1   T5 Nustep   Level 2   Watts 25   Minutes 20   METs 1.9   Prescription Details   Frequency (times per week) 3   Duration Progress to 30 minutes of continuous aerobic without signs/symptoms of physical distress   Intensity   THRR REST +  30   THRR 40-80% of Max Heartrate [phone]   Ratings of Perceived Exertion 11-13   Perceived Dyspnea 0-4   Progression   Progression Continue to progress workloads to maintain intensity without signs/symptoms of physical distress.   Resistance Training   Training Prescription Yes   Weight 1   Reps 10-12      Perform Capillary Blood Glucose checks as needed.  Exercise Prescription Changes:   Exercise Comments:    Discharge Exercise Prescription (Final Exercise Prescription Changes):   Nutrition:  Target Goals: Understanding of nutrition guidelines, daily intake of sodium 1500mg , cholesterol 200mg , calories 30% from fat and 7% or less from saturated fats, daily to have 5 or more servings of fruits and vegetables.  Biometrics:     Pre  Biometrics - 01/03/16 1807    Pre Biometrics   Height 5\' 9"  (1.753 m)   Weight 211 lb 3.2 oz (95.8 kg)   Waist  Circumference 39.7 inches   Hip Circumference 42 inches   Waist to Hip Ratio 0.95 %   BMI (Calculated) 31.3   Triceps Skinfold 12 mm   % Body Fat 27.2 %   Grip Strength 117 kg   Flexibility 18 in   Single Leg Stand 30 seconds       Nutrition Therapy Plan and Nutrition Goals:   Nutrition Discharge: Rate Your Plate Scores:     Nutrition Assessments - 01/03/16 1807    MEDFICTS Scores   Pre Score 42      Nutrition Goals Re-Evaluation:   Psychosocial: Target Goals: Acknowledge presence or absence of depression, maximize coping skills, provide positive support system. Participant is able to verbalize types and ability to use techniques and skills needed for reducing stress and depression.  Initial Review & Psychosocial Screening:     Initial Psych Review & Screening - 01/03/16 [place]? Yes   Barriers   Psychosocial barriers to participate in program There are no identifiable barriers or psychosocial needs.   Screening Interventions   Interventions Encouraged to exercise      Quality of Life Scores:     Quality of Life - 01/03/16 1813    Quality of Life Scores   Health/Function Pre 24 %   Socioeconomic Pre 28.29 %   Psych/Spiritual Pre 28.29 %   Family Pre 25.2 %   GLOBAL Pre 25.94 %      PHQ-9:     Recent Review Flowsheet Data    Depression screen [facility] 2/9 01/03/2016 10/05/2015 08/17/2015 07/18/2015 05/12/2015   Decreased Interest 0 0 0 0 0   Down, Depressed, Hopeless 0 0 0 0 0   PHQ - 2 Score 0 0 0 0 0   Altered sleeping 1 - - - -   Tired, decreased energy 0 - - - -   Change in appetite 0 - - - -   Feeling bad or failure about yourself  0 - - - -   Trouble concentrating 0 - - - -   Moving slowly or fidgety/restless 0 - - - -   Suicidal thoughts 0 - - - -   PHQ-9 Score 1 - - - -   Difficult doing  work/chores Not difficult at all - - - -      Psychosocial Evaluation and Intervention:   Psychosocial Re-Evaluation:   Vocational Rehabilitation: Provide vocational rehab assistance to qualifying candidates.   Vocational Rehab Evaluation & Intervention:     Vocational Rehab - 01/03/16 1759    Initial Vocational Rehab Evaluation & Intervention   Assessment shows need for Vocational Rehabilitation No      Education: Education Goals: Education classes will be provided on a weekly basis, covering required topics. Participant will state understanding/return demonstration of topics presented.  Learning Barriers/Preferences:     Learning Barriers/Preferences - 01/03/16 1759    Learning Barriers/Preferences   Learning Barriers None   Learning Preferences Skilled Demonstration  Reading      Education Topics: Hypertension, Hypertension Reduction -Define heart disease and high blood pressure. Discus how high blood pressure affects the body and ways to reduce high blood pressure.   Exercise and Your Heart -Discuss why it is important to exercise, the FITT principles of exercise, normal and abnormal responses to exercise, and how to exercise safely.   Angina -Discuss definition of angina, causes of angina, treatment of angina, and how to decrease risk of having angina.  Cardiac Medications -Review what the following cardiac medications are used for, how they affect the body, and side effects that may occur when taking the medications.  Medications include Aspirin, Beta blockers, calcium channel blockers, ACE Inhibitors, angiotensin receptor blockers, diuretics, digoxin, and antihyperlipidemics.   Congestive Heart Failure -Discuss the definition of CHF, how to live with CHF, the signs and symptoms of CHF, and how keep track of weight and sodium intake.   Heart Disease and Intimacy -Discus the effect sexual activity has on the heart, how changes occur during intimacy as  we age, and safety during sexual activity.   Smoking Cessation / COPD -Discuss different methods to quit smoking, the health benefits of quitting smoking, and the definition of COPD.   Nutrition I: Fats -Discuss the types of cholesterol, what cholesterol does to the heart, and how cholesterol levels can be controlled.   Nutrition II: Labels -Discuss the different components of food labels and how to read food label   Heart Parts and Heart Disease -Discuss the anatomy of the heart, the pathway of blood circulation through the heart, and these are affected by heart disease.   Stress I: Signs and Symptoms -Discuss the causes of stress, how stress may lead to anxiety and depression, and ways to limit stress.   Stress II: Relaxation -Discuss different types of relaxation techniques to limit stress.   Warning Signs of Stroke / TIA -Discuss definition of a stroke, what the signs and symptoms are of a stroke, and how to identify when someone is having stroke.   Knowledge Questionnaire Score:     Knowledge Questionnaire Score - 01/03/16 1759    Knowledge Questionnaire Score   Pre Score 21/24      Core Components/Risk Factors/Patient Goals at Admission:     Personal Goals and Risk Factors at Admission - 01/03/16 1810    Core Components/Risk Factors/Patient Goals on Admission    Weight Management Weight Loss   Sedentary --  No   Increase Strength and Stamina Yes   Intervention Provide advice, education, support and counseling about physical activity/exercise needs.   Expected Outcomes Achievement of increased cardiorespiratory fitness and enhanced flexibility, muscular endurance and strength shown through measurements of functional capacity and personal statement of participant.   Tobacco Cessation --  No   Hypertension Yes   Intervention Provide education on lifestyle modifcations including regular physical activity/exercise, weight management, moderate sodium restriction  and increased consumption of fresh fruit, vegetables, and low fat dairy, alcohol moderation, and smoking cessation.   Expected Outcomes Short Term: Continued assessment and intervention until BP is < 140/75mm HG in hypertensive participants. < 130/72mm HG in hypertensive participants with diabetes, heart failure or chronic kidney disease.;Long Term: Maintenance of blood pressure at goal levels.   Personal Goal Other Yes   Personal Goal No more episodes of SOB, Make sure heart is back to normal.    Intervention come to class 3xwee and to suppliment with 2 days outside of CR class.    Expected Outcomes To feel better, less SOB, live heart healthy lifestyle.       Core Components/Risk Factors/Patient Goals Review:    Core Components/Risk Factors/Patient Goals at Discharge (Final Review):    ITP Comments:   Comments: Patient arrived for 1st visit/orientation/education at 1430. Patient was referred to CR by Dr. [name] due to Stent Placement (Z95.5). During orientation advised patient on arrival and appointment times what to wear, what to do before, during and after exercise. Reviewed attendance and class policy.  Talked about inclement weather and class consultation policy. Pt is scheduled to return Cardiac Rehab on 01/06/16 at 0815. Pt was advised to come to class 15 minutes before class starts. He was also given instructions on meeting with the dietician and attending the Family Structure classes. Pt is eager to get started. Patient was able to complete 6 minute walk test. Patient was measured for the equipment. Discussed equipment safety with patient. Took patient pre-anthropometric measurements. Patient finished visit at 1645.

## 2016-01-06 ENCOUNTER — Encounter (HOSPITAL_COMMUNITY)
Admission: RE | Admit: 2016-01-06 | Discharge: 2016-01-06 | Disposition: A | Discharge status: Home / Self Care | Payer: BLUE CROSS/BLUE SHIELD | Source: Ambulatory Visit | Attending: Cardiology | Admitting: Cardiology

## 2016-01-06 DIAGNOSIS — Z955 Presence of coronary angioplasty implant and graft: Secondary | ICD-10-CM | POA: Insufficient documentation

## 2016-01-06 NOTE — Progress Notes (Signed)
Daily Session Note  Patient Details  Name: Jeffrey Hooper MRN: [id number] Date of Birth: 07/26/1961 Referring Provider:        CARDIAC REHAB PHASE II ORIENTATION from 01/03/2016 in [facility]   Referring Provider  Dr. [name]      Encounter Date: 01/06/2016  Check In:     Session Check In - 01/06/16 0815    Check-In   Location [facility] Pul[facility] Rehab   Staff Present Russella Dar, MS, EP, [facility], Exercise Physiologist;Other   Supervising physician immediately available to respond to emergencies See telemetry face sheet for immediately available MD   Medication changes reported     No   Fall or balance concerns reported    No   Warm-up and Cool-down Not performed (comment)   Resistance Training Performed No   VAD Patient? No   Pain Assessment   Currently in Pain? No/denies   Pain Score 0-No pain   Multiple Pain Sites No      Capillary Blood Glucose: No results found for this or any previous visit (from the past 24 hour(s)).   Goals Met:  Independence with exercise equipment Exercise tolerated well No report of cardiac concerns or symptoms Strength training completed today  Goals Unmet:  Not Applicable  Comments: Check out: 9:15   Dr. Kate Sable is Medical Director for [place] and Pulmonary Rehab.

## 2016-01-09 ENCOUNTER — Encounter (HOSPITAL_COMMUNITY)
Admission: RE | Admit: 2016-01-09 | Discharge: 2016-01-09 | Disposition: A | Discharge status: Home / Self Care | Payer: BLUE CROSS/BLUE SHIELD | Source: Ambulatory Visit | Attending: Cardiology | Admitting: Cardiology

## 2016-01-09 DIAGNOSIS — Z955 Presence of coronary angioplasty implant and graft: Secondary | ICD-10-CM | POA: Diagnosis not present

## 2016-01-09 NOTE — Progress Notes (Signed)
Daily Session Note  Patient Details  Name: ADLEY MAZUROWSKI MRN: [id number] Date of Birth: 1960-12-01 Referring Provider:        CARDIAC REHAB PHASE II ORIENTATION from 01/03/2016 in [place]   Referring Provider  Dr. [name]      Encounter Date: 01/09/2016  Check In:     Session Check In - 01/09/16 0815    Check-In   Location [facility]   Staff Present Russella Dar, MS, EP, [facility], Exercise Physiologist;Other  [place] physician immediately available to respond to emergencies See telemetry face sheet for immediately available MD   Medication changes reported     No   Fall or balance concerns reported    No   Warm-up and Cool-down Not performed (comment)   Resistance Training Performed No   VAD Patient? No   Pain Assessment   Currently in Pain? No/denies   Pain Score 0-No pain   Pain Onset More than a month ago   Multiple Pain Sites No      Capillary Blood Glucose: No results found for this or any previous visit (from the past 24 hour(s)).   Goals Met:  Independence with exercise equipment Exercise tolerated well No report of cardiac concerns or symptoms Strength training completed today  Goals Unmet:  Not Applicable  Comments: Check out 9:15   Dr. Kate Sable is Medical Director for [place] and Pulmonary Rehab.

## 2016-01-11 ENCOUNTER — Encounter (HOSPITAL_COMMUNITY)
Admission: RE | Admit: 2016-01-11 | Discharge: 2016-01-11 | Disposition: A | Discharge status: Home / Self Care | Payer: BLUE CROSS/BLUE SHIELD | Source: Ambulatory Visit | Attending: Cardiology | Admitting: Cardiology

## 2016-01-11 DIAGNOSIS — Z955 Presence of coronary angioplasty implant and graft: Secondary | ICD-10-CM | POA: Diagnosis not present

## 2016-01-11 NOTE — Progress Notes (Signed)
Daily Session Note  Patient Details  Name: Jeffrey Hooper MRN: [id number] Date of Birth: 10/01/1960 Referring Provider:        CARDIAC REHAB PHASE II ORIENTATION from 01/03/2016 in [place] CARDIAC REHABILITATION   Referring Provider  Dr. Hochrein      Encounter Date: 01/11/2016  Check In:     Session Check In - 01/11/16 0815    Check-In   Location [facility]   Staff Present Diane Coad, MS, EP, [facility], Exercise Physiologist;Other  Greg Cowan EP   Supervising physician immediately available to respond to emergencies See telemetry face sheet for immediately available MD   Medication changes reported     No   Fall or balance concerns reported    No   Warm-up and Cool-down Not performed (comment)   Resistance Training Performed No   VAD Patient? No   Pain Assessment   Currently in Pain? No/denies   Pain Score 0-No pain   Pain Onset More than a month ago   Multiple Pain Sites No      Capillary Blood Glucose: No results found for this or any previous visit (from the past 24 hour(s)).   Goals Met:  Independence with exercise equipment Exercise tolerated well No report of cardiac concerns or symptoms Strength training completed today  Goals Unmet:  Not Applicable  Comments: Check out 0915   Dr. Suresh Koneswaran is Medical Director for [place] Cardiac and Pulmonary Rehab. 

## 2016-01-13 ENCOUNTER — Encounter (HOSPITAL_COMMUNITY)
Admission: RE | Admit: 2016-01-13 | Discharge: 2016-01-13 | Disposition: A | Discharge status: Home / Self Care | Payer: BLUE CROSS/BLUE SHIELD | Source: Ambulatory Visit | Attending: Cardiology | Admitting: Cardiology

## 2016-01-13 DIAGNOSIS — Z955 Presence of coronary angioplasty implant and graft: Secondary | ICD-10-CM | POA: Diagnosis not present

## 2016-01-13 NOTE — Progress Notes (Signed)
Daily Session Note  Patient Details  Name: Jeffrey Hooper MRN: [id number] Date of Birth: Jul 30, 1961 Referring Provider:        CARDIAC REHAB PHASE II ORIENTATION from 01/03/2016 in [place]   Referring Provider  Dr. [name]      Encounter Date: 01/13/2016  Check In:     Session Check In - 01/13/16 0908    Check-In   Location [facility]   Staff Present Diane Angelina Pih, MS, EP, [facility], Exercise Physiologist;Meiya Wisler Luther Parody, BS, EP, Exercise Physiologist;Christy Oletta Lamas, RN, BSN   Supervising physician immediately available to respond to emergencies See telemetry face sheet for immediately available MD   Medication changes reported     No   Fall or balance concerns reported    No   Warm-up and Cool-down Performed as group-led instruction   Resistance Training Performed Yes   VAD Patient? No   Pain Assessment   Currently in Pain? No/denies   Pain Score 0-No pain   Multiple Pain Sites No      Capillary Blood Glucose: No results found for this or any previous visit (from the past 24 hour(s)).   Goals Met:  Independence with exercise equipment Exercise tolerated well No report of cardiac concerns or symptoms Strength training completed today  Goals Unmet:  Not Applicable  Comments: Check out 0915   Dr. Kate Sable is Medical Director for [place] and Pulmonary Rehab.

## 2016-01-16 ENCOUNTER — Encounter (HOSPITAL_COMMUNITY)
Admission: RE | Admit: 2016-01-16 | Discharge: 2016-01-16 | Disposition: A | Discharge status: Home / Self Care | Payer: BLUE CROSS/BLUE SHIELD | Source: Ambulatory Visit | Attending: Cardiology | Admitting: Cardiology

## 2016-01-16 DIAGNOSIS — Z955 Presence of coronary angioplasty implant and graft: Secondary | ICD-10-CM | POA: Diagnosis not present

## 2016-01-18 ENCOUNTER — Encounter (HOSPITAL_COMMUNITY)
Admission: RE | Admit: 2016-01-18 | Discharge: 2016-01-18 | Disposition: A | Discharge status: Home / Self Care | Payer: BLUE CROSS/BLUE SHIELD | Source: Ambulatory Visit | Attending: Cardiology | Admitting: Cardiology

## 2016-01-18 DIAGNOSIS — Z955 Presence of coronary angioplasty implant and graft: Secondary | ICD-10-CM | POA: Diagnosis not present

## 2016-01-18 NOTE — Progress Notes (Signed)
Daily Session Note  Patient Details  Name: Jeffrey Hooper MRN: [id number] Date of Birth: 05-10-1961 Referring Provider:        CARDIAC REHAB PHASE II ORIENTATION from 01/03/2016 in [place]   Referring Provider  Dr. [name]      Encounter Date: 01/18/2016  Check In:     Session Check In - 01/18/16 0815    Check-In   Location [facility]   Staff Present Diane Angelina Pih, MS, EP, [facility], Exercise Physiologist;Azrael Huss Luther Parody, BS, EP, Exercise Physiologist;Christy Oletta Lamas, RN, BSN   Supervising physician immediately available to respond to emergencies See telemetry face sheet for immediately available MD   Medication changes reported     No   Fall or balance concerns reported    No   Warm-up and Cool-down Performed as group-led instruction   Resistance Training Performed Yes   VAD Patient? No   Pain Assessment   Currently in Pain? No/denies   Pain Score 0-No pain   Multiple Pain Sites No      Capillary Blood Glucose: No results found for this or any previous visit (from the past 24 hour(s)).   Goals Met:  Independence with exercise equipment Exercise tolerated well No report of cardiac concerns or symptoms Strength training completed today  Goals Unmet:  Not Applicable  Comments: Check out 0915   Dr. Kate Sable is Medical Director for [place] and Pulmonary Rehab.

## 2016-01-20 ENCOUNTER — Encounter (HOSPITAL_COMMUNITY)
Admission: RE | Admit: 2016-01-20 | Discharge: 2016-01-20 | Disposition: A | Discharge status: Home / Self Care | Payer: BLUE CROSS/BLUE SHIELD | Source: Ambulatory Visit | Attending: Cardiology | Admitting: Cardiology

## 2016-01-20 DIAGNOSIS — Z955 Presence of coronary angioplasty implant and graft: Secondary | ICD-10-CM | POA: Diagnosis not present

## 2016-01-20 NOTE — Progress Notes (Signed)
Daily Session Note  Patient Details  Name: Jeffrey Hooper MRN: [id number] Date of Birth: August 27, 1960 Referring Provider:        CARDIAC REHAB PHASE II ORIENTATION from 01/03/2016 in [place]   Referring Provider  Dr. [name]      Encounter Date: 01/20/2016  Check In:     Session Check In - 01/20/16 0815    Check-In   Location [facility]   Staff Present Diane Angelina Pih, MS, EP, [facility], Exercise Physiologist;Tsuruko Murtha Luther Parody, BS, EP, Exercise Physiologist;Christy Oletta Lamas, RN, BSN   Supervising physician immediately available to respond to emergencies See telemetry face sheet for immediately available MD   Medication changes reported     No   Fall or balance concerns reported    No   Warm-up and Cool-down Performed as group-led instruction   Resistance Training Performed Yes   VAD Patient? No   Pain Assessment   Currently in Pain? No/denies   Pain Score 0-No pain   Multiple Pain Sites No      Capillary Blood Glucose: No results found for this or any previous visit (from the past 24 hour(s)).   Goals Met:  Independence with exercise equipment Exercise tolerated well No report of cardiac concerns or symptoms Strength training completed today  Goals Unmet:  Not Applicable  Comments: Check out 0915   Dr. Kate Sable is Medical Director for [place] and Pulmonary Rehab.

## 2016-01-22 ENCOUNTER — Other Ambulatory Visit: Payer: Self-pay | Admitting: Pediatrics

## 2016-01-23 ENCOUNTER — Encounter (HOSPITAL_COMMUNITY)
Admission: RE | Admit: 2016-01-23 | Discharge: 2016-01-23 | Disposition: A | Discharge status: Home / Self Care | Payer: BLUE CROSS/BLUE SHIELD | Source: Ambulatory Visit | Attending: Cardiology | Admitting: Cardiology

## 2016-01-23 DIAGNOSIS — Z955 Presence of coronary angioplasty implant and graft: Secondary | ICD-10-CM | POA: Diagnosis not present

## 2016-01-23 NOTE — Progress Notes (Signed)
Daily Session Note  Patient Details  Name: Jeffrey Hooper MRN: [id number] Date of Birth: [birth year] Referring Provider:        CARDIAC REHAB PHASE II ORIENTATION from 01/03/2016 in [place]   Referring Provider  Dr. [name]      Encounter Date: 01/23/2016  Check In:     Session Check In - 01/23/16 0815    Check-In   Location [facility]   Staff Present Jurgen Groeneveld Angelina Pih, MS, EP, [facility], Exercise Physiologist;Gregory Luther Parody, BS, EP, Exercise Physiologist;Christy Oletta Lamas, RN, BSN   Supervising physician immediately available to respond to emergencies See telemetry face sheet for immediately available MD   Medication changes reported     No   Fall or balance concerns reported    No   Warm-up and Cool-down Performed as group-led instruction   Resistance Training Performed Yes   VAD Patient? No   Pain Assessment   Currently in Pain? No/denies   Pain Score 0-No pain   Multiple Pain Sites No      Capillary Blood Glucose: No results found for this or any previous visit (from the past 24 hour(s)).   Goals Met:  Independence with exercise equipment Exercise tolerated well No report of cardiac concerns or symptoms Strength training completed today  Goals Unmet:  Not Applicable  Comments: Check out 0915   Dr. Kate Sable is Medical Director for [place] and Pulmonary Rehab.

## 2016-01-23 NOTE — Telephone Encounter (Signed)
Last seen 10/18/15 Dr Evette Doffing  Last lipid 11/13/13

## 2016-01-25 ENCOUNTER — Encounter (HOSPITAL_COMMUNITY)
Admission: RE | Admit: 2016-01-25 | Discharge: 2016-01-25 | Disposition: A | Discharge status: Home / Self Care | Payer: BLUE CROSS/BLUE SHIELD | Source: Ambulatory Visit | Attending: Cardiology | Admitting: Cardiology

## 2016-01-25 DIAGNOSIS — Z955 Presence of coronary angioplasty implant and graft: Secondary | ICD-10-CM | POA: Diagnosis not present

## 2016-01-25 NOTE — Telephone Encounter (Signed)
Ok rf x 2

## 2016-01-25 NOTE — Progress Notes (Signed)
Daily Session Note  Patient Details  Name: RAZA BAYLESS MRN: [id number] Date of Birth: 11/25/1960 Referring Provider:        CARDIAC REHAB PHASE II ORIENTATION from 01/03/2016 in [place]   Referring Provider  Dr. [name]      Encounter Date: 01/25/2016  Check In:     Session Check In - 01/25/16 0815    Check-In   Location [facility]   Staff Present Russella Dar, MS, EP, [facility], Exercise Physiologist;Nevaan Bunton Luther Parody, BS, EP, Exercise Physiologist   Supervising physician immediately available to respond to emergencies See telemetry face sheet for immediately available MD   Medication changes reported     No   Fall or balance concerns reported    No   Warm-up and Cool-down Performed as group-led instruction   Resistance Training Performed Yes   VAD Patient? No   Pain Assessment   Currently in Pain? No/denies   Pain Score 0-No pain   Multiple Pain Sites No      Capillary Blood Glucose: No results found for this or any previous visit (from the past 24 hour(s)).   Goals Met:  Independence with exercise equipment Exercise tolerated well No report of cardiac concerns or symptoms Strength training completed today  Goals Unmet:  Not Applicable  Comments: Check out 0915   Dr. Kate Sable is Medical Director for [place] and Pulmonary Rehab.

## 2016-01-27 ENCOUNTER — Encounter (HOSPITAL_COMMUNITY)
Admission: RE | Admit: 2016-01-27 | Discharge: 2016-01-27 | Disposition: A | Discharge status: Home / Self Care | Payer: BLUE CROSS/BLUE SHIELD | Source: Ambulatory Visit | Attending: Cardiology | Admitting: Cardiology

## 2016-01-27 DIAGNOSIS — Z955 Presence of coronary angioplasty implant and graft: Secondary | ICD-10-CM | POA: Diagnosis not present

## 2016-01-27 NOTE — Progress Notes (Signed)
Daily Session Note  Patient Details  Name: Jeffrey Hooper MRN: [id number] Date of Birth: [birth year] Referring Provider:        CARDIAC REHAB PHASE II ORIENTATION from 01/03/2016 in [place]   Referring Provider  Dr. [name]      Encounter Date: 01/27/2016  Check In:     Session Check In - 01/27/16 0815    Check-In   Location [facility]   Staff Present Russella Dar, MS, EP, [facility], Exercise Physiologist;Aiko Belko Luther Parody, BS, EP, Exercise Physiologist   Supervising physician immediately available to respond to emergencies See telemetry face sheet for immediately available MD   Medication changes reported     No   Fall or balance concerns reported    No   Warm-up and Cool-down Performed as group-led instruction   Resistance Training Performed Yes   VAD Patient? No   Pain Assessment   Currently in Pain? No/denies   Pain Score 0-No pain   Multiple Pain Sites No      Capillary Blood Glucose: No results found for this or any previous visit (from the past 24 hour(s)).   Goals Met:  Independence with exercise equipment Exercise tolerated well No report of cardiac concerns or symptoms Strength training completed today  Goals Unmet:  Not Applicable  Comments: Check out 915   Dr. Kate Sable is Medical Director for [place] and Pulmonary Rehab.

## 2016-01-30 ENCOUNTER — Encounter (HOSPITAL_COMMUNITY)
Admission: RE | Admit: 2016-01-30 | Discharge: 2016-01-30 | Disposition: A | Discharge status: Home / Self Care | Payer: BLUE CROSS/BLUE SHIELD | Source: Ambulatory Visit | Attending: Cardiology | Admitting: Cardiology

## 2016-01-30 DIAGNOSIS — Z955 Presence of coronary angioplasty implant and graft: Secondary | ICD-10-CM | POA: Diagnosis not present

## 2016-01-30 NOTE — Progress Notes (Signed)
Daily Session Note  Patient Details  Name: Jeffrey Hooper MRN: [id number] Date of Birth: 02/17/1961 Referring Provider:        CARDIAC REHAB PHASE II ORIENTATION from 01/03/2016 in [place]   Referring Provider  Dr. [name]      Encounter Date: 01/30/2016  Check In:     Session Check In - 01/30/16 0815    Check-In   Location [facility]   Staff Present Suzanne Boron, BS, EP, Exercise Physiologist;Christy Oletta Lamas, RN, BSN   Supervising physician immediately available to respond to emergencies See telemetry face sheet for immediately available MD   Medication changes reported     No   Fall or balance concerns reported    No   Warm-up and Cool-down Performed as group-led instruction   Resistance Training Performed Yes   VAD Patient? No   Pain Assessment   Currently in Pain? No/denies   Pain Score 0-No pain   Multiple Pain Sites No      Capillary Blood Glucose: No results found for this or any previous visit (from the past 24 hour(s)).   Goals Met:  Independence with exercise equipment Exercise tolerated well No report of cardiac concerns or symptoms Strength training completed today  Goals Unmet:  Not Applicable  Comments: Check out 915   Dr. Kate Sable is Medical Director for [place] and Pulmonary Rehab.

## 2016-01-30 NOTE — Progress Notes (Signed)
Cardiac Individual Treatment Plan  Patient Details  Name: Jeffrey Hooper MRN: [id number] Date of Birth: 09/23/1960 Referring Provider:        CARDIAC REHAB PHASE II ORIENTATION from 01/03/2016 in [place]   Referring Provider  Dr. [name]      Initial Encounter Date:       CARDIAC REHAB PHASE II ORIENTATION from 01/03/2016 in [place]   Date  01/03/16   Referring Provider  Dr. [name]      Visit Diagnosis: Stented coronary artery  Patient's Home Medications on Admission:  Current outpatient prescriptions:  .  acetaminophen (TYLENOL) 500 MG tablet, Take 1,000 mg by mouth every 6 (six) hours as needed (pain). , Disp: , Rfl:  .  amLODipine (NORVASC) 5 MG tablet, Take 1 tablet (5 mg total) by mouth daily., Disp: 90 tablet, Rfl: 3 .  aspirin EC 81 MG tablet, Take 81 mg by mouth at bedtime., Disp: , Rfl:  .  atorvastatin (LIPITOR) 80 MG tablet, Take 1 tablet (80 mg total) by mouth daily. (Patient taking differently: Take 80 mg by mouth daily at 6 PM. ), Disp: 90 tablet, Rfl: 3 .  clopidogrel (PLAVIX) 75 MG tablet, Take 1 tablet (75 mg total) by mouth daily., Disp: 30 tablet, Rfl: 11 .  diphenhydrAMINE (BENADRYL) 25 MG tablet, Take 50 mg by mouth every 6 (six) hours as needed for itching., Disp: , Rfl:  .  esomeprazole (NEXIUM) 40 MG capsule, Take 1 capsule (40 mg total) by mouth daily at 12 noon. (Patient taking differently: Take 40 mg by mouth daily with breakfast. ), Disp: 90 capsule, Rfl: 3 .  hydrochlorothiazide (HYDRODIURIL) 25 MG tablet, Take 1 tablet (25 mg total) by mouth daily., Disp: 90 tablet, Rfl: 1 .  levothyroxine (SYNTHROID, LEVOTHROID) 100 MCG tablet, Take 1 tablet (100 mcg total) by mouth daily., Disp: 90 tablet, Rfl: 1 .  Melatonin 10 MG TABS, Take 10 mg by mouth at bedtime. , Disp: , Rfl:  .  metoprolol tartrate (LOPRESSOR) 25 MG tablet, Take 0.5 tablets (12.5 mg total) by mouth 2 (two) times daily., Disp: 30 tablet, Rfl:  5 .  nitroGLYCERIN (NITROSTAT) 0.4 MG SL tablet, Place 1 tablet (0.4 mg total) under the tongue every 5 (five) minutes as needed for chest pain., Disp: 50 tablet, Rfl: 0  Past Medical History: Past Medical History  Diagnosis Date  . Hypertension   . Pulmonary nodule   . Hypothyroid   . GERD (gastroesophageal reflux disease)   . Prostatism   . Seasonal allergies   . Hyperlipidemia   . Multiple benign nevi   . Allergy   . Arthritis   . Coronary artery disease   . Anginal pain ([place])   . Dysrhythmia     "irregular and PAC'S"  . Pneumonia 2014, 2017  . Headache   . Neuromuscular disorder ([place]) 2011    LEFT RADIAL NERVE SURGERY R/T TRAUMA  . CAD (coronary artery disease), native coronary artery 12/03/2015    Tobacco Use: History  Smoking status  . Former Smoker -- 1.00 packs/day for 20 years  . Types: Cigarettes  . Quit date: 11/28/2006  Smokeless tobacco  . Never Used    Labs:     Recent Review Flowsheet Data    Labs for ITP Cardiac and Pulmonary Rehab Latest Ref Rng 06/25/2012 10/29/2012 04/28/2013 08/07/2013 05/18/2015   Cholestrol 100 - 199 mg/dL - 136 122 127 -   LDLCALC 0 - 99 mg/dL - 74  50 65 -   HDL >39 mg/dL - 41 44 45 -   Trlycerides 0 - 149 mg/dL - 103 139 83 -   Hemoglobin A1c 4.8 - 5.6 % - - - - 5.8(H)   TCO2 0 - 100 mmol/L 27 - - - -      Capillary Blood Glucose: No results found for: GLUCAP   Exercise Target Goals:    Exercise Program Goal: Individual exercise prescription set with THRR, safety & activity barriers. Participant demonstrates ability to understand and report RPE using BORG scale, to self-measure pulse accurately, and to acknowledge the importance of the exercise prescription.  Exercise Prescription Goal: Starting with aerobic activity 30 plus minutes a day, 3 days per week for initial exercise prescription. Provide home exercise prescription and guidelines that participant acknowledges understanding prior to discharge.  Activity  Barriers & Risk Stratification:     Activity Barriers & Cardiac Risk Stratification - 01/03/16 1757    Activity Barriers & Cardiac Risk Stratification   Activity Barriers None   Cardiac Risk Stratification High      6 Minute Walk:     6 Minute Walk      01/03/16 1802       6 Minute Walk   Phase Initial     Distance 1300 feet     Walk Time 6 minutes     # of Rest Breaks 0     MPH 2.46     METS 2.88     RPE 11     Perceived Dyspnea  12     VO2 Peak 12.2     Symptoms No     Resting HR 51 bpm     Resting BP 142/62 mmHg     Max Ex. HR 82 bpm     Max Ex. BP 144/74 mmHg     2 Minute Post BP 118/62 mmHg        Initial Exercise Prescription:     Initial Exercise Prescription - 01/03/16 1800    Date of Initial Exercise RX and Referring Provider   Date 01/03/16   Referring Provider Dr. [name]   Treadmill   MPH 1.5   Grade 0   Minutes 15   METs 2.1   T5 Nustep   Level 2   Watts 25   Minutes 20   METs 1.9   Prescription Details   Frequency (times per week) 3   Duration Progress to 30 minutes of continuous aerobic without signs/symptoms of physical distress   Intensity   THRR REST +  30   THRR 40-80% of Max Heartrate [phone]   Ratings of Perceived Exertion 11-13   Perceived Dyspnea 0-4   Progression   Progression Continue to progress workloads to maintain intensity without signs/symptoms of physical distress.   Resistance Training   Training Prescription Yes   Weight 1   Reps 10-12      Perform Capillary Blood Glucose checks as needed.  Exercise Prescription Changes:      Exercise Prescription Changes      01/11/16 1400 01/18/16 0800 01/23/16 1400 01/30/16 1700     Exercise Review   Progression Yes Yes Yes     Response to Exercise   Blood Pressure (Admit) 112/60 mmHg 112/60 mmHg 98/64 mmHg 100/60 mmHg    Blood Pressure (Exercise) 130/60 mmHg 130/60 mmHg 150/60 mmHg 154/76 mmHg    Blood Pressure (Exit) 108/60 mmHg 108/60 mmHg 108/54 mmHg  106/56 mmHg    Heart Rate (Admit) 50  bpm 50 bpm 53 bpm 65 bpm    Heart Rate (Exercise) 83 bpm 83 bpm 85 bpm 95 bpm    Heart Rate (Exit) 72 bpm 72 bpm 61 bpm 62 bpm    Resistance Training   Training Prescription Yes Yes Yes Yes    Weight 1 1 1 2     Reps 10-12 10-12 10-12 10-12    Treadmill   MPH 1.9 2.1 2.2 2.2    Grade 0 0 0 0    Minutes 15 15 15 15     METs 2.2 2.3 2.3 2.6    T5 Nustep   Level 3 3 3 3     Watts 35 35 35 86    Minutes 20 20 20 20     METs 1.9 1.9 1.9 4.4    Home Exercise Plan   Plans to continue exercise at    Home    Frequency    Add 2 additional days to program exercise sessions.       Exercise Comments:    Discharge Exercise Prescription (Final Exercise Prescription Changes):     Exercise Prescription Changes - 01/30/16 1700    Response to Exercise   Blood Pressure (Admit) 100/60 mmHg   Blood Pressure (Exercise) 154/76 mmHg   Blood Pressure (Exit) 106/56 mmHg   Heart Rate (Admit) 65 bpm   Heart Rate (Exercise) 95 bpm   Heart Rate (Exit) 62 bpm   Resistance Training   Training Prescription Yes   Weight 2   Reps 10-12   Treadmill   MPH 2.2   Grade 0   Minutes 15   METs 2.6   T5 Nustep   Level 3   Watts 86   Minutes 20   METs 4.4   Home Exercise Plan   Plans to continue exercise at Home   Frequency Add 2 additional days to program exercise sessions.      Nutrition:  Target Goals: Understanding of nutrition guidelines, daily intake of sodium 1500mg , cholesterol 200mg , calories 30% from fat and 7% or less from saturated fats, daily to have 5 or more servings of fruits and vegetables.  Biometrics:     Pre Biometrics - 01/03/16 1807    Pre Biometrics   Height 5\' 9"  (1.753 m)   Weight 211 lb 3.2 oz (95.8 kg)   Waist Circumference 39.7 inches   Hip Circumference 42 inches   Waist to Hip Ratio 0.95 %   BMI (Calculated) 31.3   Triceps Skinfold 12 mm   % Body Fat 27.2 %   Grip Strength 117 kg   Flexibility 18 in   Single Leg Stand 30  seconds       Nutrition Therapy Plan and Nutrition Goals:   Nutrition Discharge: Rate Your Plate Scores:     Nutrition Assessments - 01/03/16 1807    MEDFICTS Scores   Pre Score 42      Nutrition Goals Re-Evaluation:   Psychosocial: Target Goals: Acknowledge presence or absence of depression, maximize coping skills, provide positive support system. Participant is able to verbalize types and ability to use techniques and skills needed for reducing stress and depression.  Initial Review & Psychosocial Screening:     Initial Psych Review & Screening - 01/03/16 [place]? Yes   Barriers   Psychosocial barriers to participate in program There are no identifiable barriers or psychosocial needs.   Screening Interventions   Interventions Encouraged to exercise  Quality of Life Scores:     Quality of Life - 01/03/16 1813    Quality of Life Scores   Health/Function Pre 24 %   Socioeconomic Pre 28.29 %   Psych/Spiritual Pre 28.29 %   Family Pre 25.2 %   GLOBAL Pre 25.94 %      PHQ-9:     Recent Review Flowsheet Data    Depression screen [facility] 2/9 01/03/2016 10/05/2015 08/17/2015 07/18/2015 05/12/2015   Decreased Interest 0 0 0 0 0   Down, Depressed, Hopeless 0 0 0 0 0   PHQ - 2 Score 0 0 0 0 0   Altered sleeping 1 - - - -   Tired, decreased energy 0 - - - -   Change in appetite 0 - - - -   Feeling bad or failure about yourself  0 - - - -   Trouble concentrating 0 - - - -   Moving slowly or fidgety/restless 0 - - - -   Suicidal thoughts 0 - - - -   PHQ-9 Score 1 - - - -   Difficult doing work/chores Not difficult at all - - - -      Psychosocial Evaluation and Intervention:   Psychosocial Re-Evaluation:     Psychosocial Re-Evaluation      01/30/16 1756           Psychosocial Re-Evaluation   Interventions Encouraged to attend Cardiac Rehabilitation for the exercise       Continued Psychosocial Services Needed No           Vocational Rehabilitation: Provide vocational rehab assistance to qualifying candidates.   Vocational Rehab Evaluation & Intervention:     Vocational Rehab - 01/03/16 1759    Initial Vocational Rehab Evaluation & Intervention   Assessment shows need for Vocational Rehabilitation No      Education: Education Goals: Education classes will be provided on a weekly basis, covering required topics. Participant will state understanding/return demonstration of topics presented.  Learning Barriers/Preferences:     Learning Barriers/Preferences - 01/03/16 1759    Learning Barriers/Preferences   Learning Barriers None   Learning Preferences Skilled Demonstration  Reading      Education Topics: Hypertension, Hypertension Reduction -Define heart disease and high blood pressure. Discus how high blood pressure affects the body and ways to reduce high blood pressure.   Exercise and Your Heart -Discuss why it is important to exercise, the FITT principles of exercise, normal and abnormal responses to exercise, and how to exercise safely.   Angina -Discuss definition of angina, causes of angina, treatment of angina, and how to decrease risk of having angina.   Cardiac Medications -Review what the following cardiac medications are used for, how they affect the body, and side effects that may occur when taking the medications.  Medications include Aspirin, Beta blockers, calcium channel blockers, ACE Inhibitors, angiotensin receptor blockers, diuretics, digoxin, and antihyperlipidemics.   Congestive Heart Failure -Discuss the definition of CHF, how to live with CHF, the signs and symptoms of CHF, and how keep track of weight and sodium intake.   Heart Disease and Intimacy -Discus the effect sexual activity has on the heart, how changes occur during intimacy as we age, and safety during sexual activity.      CARDIAC REHAB PHASE II EXERCISE from 01/25/2016 in [place]   Date  01/11/16   Educator  DC   Instruction Review Code  2- meets goals/outcomes  Smoking Cessation / COPD -Discuss different methods to quit smoking, the health benefits of quitting smoking, and the definition of COPD.      CARDIAC REHAB PHASE II EXERCISE from 01/25/2016 in [place]   Date  01/18/16   Educator  Russella Dar   Instruction Review Code  2- meets goals/outcomes      Nutrition I: Fats -Discuss the types of cholesterol, what cholesterol does to the heart, and how cholesterol levels can be controlled.          CARDIAC REHAB PHASE II EXERCISE from 01/25/2016 in [place]   Date  01/25/16   Educator  Russella Dar   Instruction Review Code  2- meets goals/outcomes      Nutrition II: Labels -Discuss the different components of food labels and how to read food label   Heart Parts and Heart Disease -Discuss the anatomy of the heart, the pathway of blood circulation through the heart, and these are affected by heart disease.   Stress I: Signs and Symptoms -Discuss the causes of stress, how stress may lead to anxiety and depression, and ways to limit stress.   Stress II: Relaxation -Discuss different types of relaxation techniques to limit stress.   Warning Signs of Stroke / TIA -Discuss definition of a stroke, what the signs and symptoms are of a stroke, and how to identify when someone is having stroke.   Knowledge Questionnaire Score:     Knowledge Questionnaire Score - 01/03/16 1759    Knowledge Questionnaire Score   Pre Score 21/24      Core Components/Risk Factors/Patient Goals at Admission:     Personal Goals and Risk Factors at Admission - 01/03/16 1810    Core Components/Risk Factors/Patient Goals on Admission    Weight Management Weight Loss   Sedentary --  No   Increase Strength and Stamina Yes   Intervention Provide advice, education, support and counseling about physical  activity/exercise needs.   Expected Outcomes Achievement of increased cardiorespiratory fitness and enhanced flexibility, muscular endurance and strength shown through measurements of functional capacity and personal statement of participant.   Tobacco Cessation --  No   Hypertension Yes   Intervention Provide education on lifestyle modifcations including regular physical activity/exercise, weight management, moderate sodium restriction and increased consumption of fresh fruit, vegetables, and low fat dairy, alcohol moderation, and smoking cessation.   Expected Outcomes Short Term: Continued assessment and intervention until BP is < 140/38mm HG in hypertensive participants. < 130/33mm HG in hypertensive participants with diabetes, heart failure or chronic kidney disease.;Long Term: Maintenance of blood pressure at goal levels.   Personal Goal Other Yes   Personal Goal No more episodes of SOB, Make sure heart is back to normal.    Intervention come to class 3xwee and to suppliment with 2 days outside of CR class.    Expected Outcomes To feel better, less SOB, live heart healthy lifestyle.       Core Components/Risk Factors/Patient Goals Review:      Goals and Risk Factor Review      01/30/16 1754           Core Components/Risk Factors/Patient Goals Review   Personal Goals Review Improve shortness of breath with ADL's;Increase Strength and Stamina       Review Improve SOB with exertion, get back to normal with ADL's and heart health in general.        Expected Outcomes Little to no SOB with ADL's  Core Components/Risk Factors/Patient Goals at Discharge (Final Review):      Goals and Risk Factor Review - 01/30/16 1754    Core Components/Risk Factors/Patient Goals Review   Personal Goals Review Improve shortness of breath with ADL's;Increase Strength and Stamina   Review Improve SOB with exertion, get back to normal with ADL's and heart health in general.    Expected Outcomes  Little to no SOB with ADL's      ITP Comments:   Comments: Patient is doing well with program. Will continue to monitor for progress.

## 2016-02-01 ENCOUNTER — Encounter (HOSPITAL_COMMUNITY)
Admission: RE | Admit: 2016-02-01 | Discharge: 2016-02-01 | Disposition: A | Discharge status: Home / Self Care | Payer: BLUE CROSS/BLUE SHIELD | Source: Ambulatory Visit | Attending: Cardiology | Admitting: Cardiology

## 2016-02-01 DIAGNOSIS — Z955 Presence of coronary angioplasty implant and graft: Secondary | ICD-10-CM | POA: Diagnosis not present

## 2016-02-01 NOTE — Progress Notes (Signed)
Daily Session Note  Patient Details  Name: Jeffrey Hooper MRN: [id number] Date of Birth: [birth year] Referring Provider:        CARDIAC REHAB PHASE II ORIENTATION from 01/03/2016 in [place]   Referring Provider  Dr. [name]      Encounter Date: 02/01/2016  Check In:     Session Check In - 02/01/16 0915    Check-In   Location [facility]   Staff Present Russella Dar, MS, EP, [facility], Exercise Physiologist;Rasheida Broden Luther Parody, BS, EP, Exercise Physiologist   Supervising physician immediately available to respond to emergencies See telemetry face sheet for immediately available MD   Medication changes reported     No   Fall or balance concerns reported    No   Warm-up and Cool-down Performed as group-led instruction   Resistance Training Performed Yes   VAD Patient? No   Pain Assessment   Currently in Pain? No/denies   Pain Score 0-No pain   Multiple Pain Sites No      Capillary Blood Glucose: No results found for this or any previous visit (from the past 24 hour(s)).   Goals Met:  Independence with exercise equipment Exercise tolerated well No report of cardiac concerns or symptoms Strength training completed today  Goals Unmet:  Not Applicable  Comments: Check out 915   Dr. Kate Sable is Medical Director for [place] and Pulmonary Rehab.

## 2016-02-03 ENCOUNTER — Encounter (HOSPITAL_COMMUNITY): Payer: BLUE CROSS/BLUE SHIELD

## 2016-02-06 ENCOUNTER — Encounter (HOSPITAL_COMMUNITY)
Admission: RE | Admit: 2016-02-06 | Discharge: 2016-02-06 | Disposition: A | Discharge status: Home / Self Care | Payer: BLUE CROSS/BLUE SHIELD | Source: Ambulatory Visit | Attending: Cardiology | Admitting: Cardiology

## 2016-02-06 DIAGNOSIS — Z955 Presence of coronary angioplasty implant and graft: Secondary | ICD-10-CM | POA: Diagnosis not present

## 2016-02-06 NOTE — Progress Notes (Signed)
Daily Session Note  Patient Details  Name: Jeffrey Hooper MRN: [id number] Date of Birth: 11/25/1960 Referring Provider:        CARDIAC REHAB PHASE II ORIENTATION from 01/03/2016 in [place] [facility]   Referring Provider  Dr. Hochrein      Encounter Date: 02/06/2016  Check In:     Session Check In - 02/06/16 0815    Check-In   Location [facility]   Staff Present Diane Coad, MS, EP, [facility], Exercise Physiologist;Gregory Cowan, BS, EP, Exercise Physiologist;Debra Johnson, RN, BSN   Supervising physician immediately available to respond to emergencies See telemetry face sheet for immediately available MD   Medication changes reported     No   Fall or balance concerns reported    No   Warm-up and Cool-down Performed as group-led instruction   Resistance Training Performed Yes   VAD Patient? No   Pain Assessment   Currently in Pain? No/denies   Pain Score 0-No pain   Multiple Pain Sites No      Capillary Blood Glucose: No results found for this or any previous visit (from the past 24 hour(s)).   Goals Met:  Independence with exercise equipment Exercise tolerated well No report of cardiac concerns or symptoms Strength training completed today  Goals Unmet:  Not Applicable  Comments: Check out 9:15.   Dr. Suresh Koneswaran is Medical Director for [place] [facility] and Pul[facility] Rehab. 

## 2016-02-08 ENCOUNTER — Encounter (HOSPITAL_COMMUNITY)
Admission: RE | Admit: 2016-02-08 | Discharge: 2016-02-08 | Disposition: A | Discharge status: Home / Self Care | Payer: BLUE CROSS/BLUE SHIELD | Source: Ambulatory Visit | Attending: Cardiology | Admitting: Cardiology

## 2016-02-08 DIAGNOSIS — Z955 Presence of coronary angioplasty implant and graft: Secondary | ICD-10-CM | POA: Diagnosis not present

## 2016-02-08 NOTE — Progress Notes (Signed)
Daily Session Note  Patient Details  Name: Jeffrey Hooper MRN: [id number] Date of Birth: 08/18/1960 Referring Provider:        CARDIAC REHAB PHASE II ORIENTATION from 01/03/2016 in  [facility]   Referring Provider  Dr. Hochrein      Encounter Date: 02/08/2016  Check In:     Session Check In - 02/08/16 0815    Check-In   Location [facility]   Staff Present Diane Coad, MS, EP, [facility], Exercise Physiologist;Gregory Cowan, BS, EP, Exercise Physiologist;Debra Johnson, RN, BSN   Supervising physician immediately available to respond to emergencies See telemetry face sheet for immediately available MD   Medication changes reported     No   Fall or balance concerns reported    No   Warm-up and Cool-down Performed as group-led instruction   Resistance Training Performed Yes   VAD Patient? No   Pain Assessment   Currently in Pain? No/denies   Pain Score 0-No pain   Multiple Pain Sites No      Capillary Blood Glucose: No results found for this or any previous visit (from the past 24 hour(s)).   Goals Met:  Independence with exercise equipment Exercise tolerated well No report of cardiac concerns or symptoms Strength training completed today  Goals Unmet:  Not Applicable  Comments: Check out 9:15.   Dr. Suresh Koneswaran is Medical Director for  Cardiac and Pulmonary Rehab. 

## 2016-02-10 ENCOUNTER — Encounter (HOSPITAL_COMMUNITY)
Admission: RE | Admit: 2016-02-10 | Discharge: 2016-02-10 | Disposition: A | Discharge status: Home / Self Care | Payer: BLUE CROSS/BLUE SHIELD | Source: Ambulatory Visit | Attending: Cardiology | Admitting: Cardiology

## 2016-02-10 DIAGNOSIS — Z955 Presence of coronary angioplasty implant and graft: Secondary | ICD-10-CM

## 2016-02-10 NOTE — Progress Notes (Signed)
Daily Session Note  Patient Details  Name: Jeffrey Hooper MRN: [id number] Date of Birth: 11-10-1960 Referring Provider:        CARDIAC REHAB PHASE II ORIENTATION from 01/03/2016 in [place]   Referring Provider  Dr. [name]      Encounter Date: 02/10/2016  Check In:     Session Check In - 02/10/16 0815    Check-In   Location [facility]   Staff Present Diane Angelina Pih, MS, EP, [facility], Exercise Physiologist;Mariadejesus Cade Luther Parody, BS, EP, Exercise Physiologist;Debra Wynetta Emery, RN, BSN   Supervising physician immediately available to respond to emergencies See telemetry face sheet for immediately available MD   Medication changes reported     No   Fall or balance concerns reported    No   Warm-up and Cool-down Performed as group-led instruction   Resistance Training Performed Yes   VAD Patient? No   Pain Assessment   Currently in Pain? No/denies   Pain Score 0-No pain   Multiple Pain Sites No      Capillary Blood Glucose: No results found for this or any previous visit (from the past 24 hour(s)).   Goals Met:  Independence with exercise equipment Exercise tolerated well No report of cardiac concerns or symptoms Strength training completed today  Goals Unmet:  Not Applicable  Comments: Check out 915   Dr. Kate Sable is Medical Director for [place] and Pulmonary Rehab.

## 2016-02-13 ENCOUNTER — Encounter (HOSPITAL_COMMUNITY)
Admission: RE | Admit: 2016-02-13 | Discharge: 2016-02-13 | Disposition: A | Discharge status: Home / Self Care | Payer: BLUE CROSS/BLUE SHIELD | Source: Ambulatory Visit | Attending: Cardiology | Admitting: Cardiology

## 2016-02-13 DIAGNOSIS — Z955 Presence of coronary angioplasty implant and graft: Secondary | ICD-10-CM

## 2016-02-13 NOTE — Progress Notes (Signed)
Daily Session Note  Patient Details  Name: DANG MATHISON MRN: [id number] Date of Birth: 1961/04/12 Referring Provider:        CARDIAC REHAB PHASE II ORIENTATION from 01/03/2016 in [place]   Referring Provider  Dr. [name]      Encounter Date: 02/13/2016  Check In:     Session Check In - 02/13/16 0815    Check-In   Location [facility]   Staff Present Diane Angelina Pih, MS, EP, [facility], Exercise Physiologist;Shamyia Grandpre Luther Parody, BS, EP, Exercise Physiologist;Debra Wynetta Emery, RN, BSN   Supervising physician immediately available to respond to emergencies See telemetry face sheet for immediately available MD   Medication changes reported     No   Fall or balance concerns reported    No   Warm-up and Cool-down Performed as group-led instruction   Resistance Training Performed Yes   VAD Patient? No   Pain Assessment   Currently in Pain? No/denies   Pain Score 0-No pain   Multiple Pain Sites No      Capillary Blood Glucose: No results found for this or any previous visit (from the past 24 hour(s)).   Goals Met:  Independence with exercise equipment Exercise tolerated well No report of cardiac concerns or symptoms Strength training completed today  Goals Unmet:  Not Applicable  Comments: Check out 915   Dr. Kate Sable is Medical Director for [place] and Pulmonary Rehab.

## 2016-02-15 ENCOUNTER — Encounter (HOSPITAL_COMMUNITY): Payer: BLUE CROSS/BLUE SHIELD

## 2016-02-17 ENCOUNTER — Ambulatory Visit (INDEPENDENT_AMBULATORY_CARE_PROVIDER_SITE_OTHER): Payer: BLUE CROSS/BLUE SHIELD | Admitting: Family

## 2016-02-17 ENCOUNTER — Encounter: Payer: Self-pay | Admitting: Family

## 2016-02-17 ENCOUNTER — Encounter (HOSPITAL_COMMUNITY)
Admission: RE | Admit: 2016-02-17 | Discharge: 2016-02-17 | Disposition: A | Discharge status: Home / Self Care | Payer: BLUE CROSS/BLUE SHIELD | Source: Ambulatory Visit | Attending: Cardiology | Admitting: Cardiology

## 2016-02-17 VITALS — BP 116/69 | HR 56 | Temp 97.1°F | Ht 69.0 in | Wt 208.0 lb

## 2016-02-17 DIAGNOSIS — T7840XA Allergy, unspecified, initial encounter: Secondary | ICD-10-CM | POA: Diagnosis not present

## 2016-02-17 DIAGNOSIS — Z955 Presence of coronary angioplasty implant and graft: Secondary | ICD-10-CM

## 2016-02-17 DIAGNOSIS — R110 Nausea: Secondary | ICD-10-CM

## 2016-02-17 DIAGNOSIS — L509 Urticaria, unspecified: Secondary | ICD-10-CM

## 2016-02-17 DIAGNOSIS — T7849XD Other allergy, subsequent encounter: Secondary | ICD-10-CM | POA: Diagnosis not present

## 2016-02-17 MED ORDER — EPINEPHRINE 0.3 MG/0.3ML IJ SOAJ: 0.3000 mg | Freq: Once | INTRAMUSCULAR | Status: DC

## 2016-02-17 MED ORDER — ONDANSETRON HCL 4 MG PO TABS: 4.0000 mg | ORAL_TABLET | Freq: Three times a day (TID) | ORAL | Status: DC | PRN

## 2016-02-17 MED ORDER — METHYLPREDNISOLONE ACETATE 80 MG/ML IJ SUSP
80.0000 mg | Freq: Once | INTRAMUSCULAR | Status: AC
  Administered 2016-02-17: 80 mg via INTRAMUSCULAR

## 2016-02-17 NOTE — Patient Instructions (Signed)
Anaphylactic Reaction An anaphylactic reaction is a sudden, severe allergic reaction that involves the whole body. It can be life threatening. A hospital stay is often required. People with asthma, eczema, or hay fever are slightly more likely to have an anaphylactic reaction. CAUSES  An anaphylactic reaction may be caused by anything to which you are allergic. After being exposed to the allergic substance, your immune system becomes sensitized to it. When you are exposed to that allergic substance again, an allergic reaction can occur. Common causes of an anaphylactic reaction include:  Medicines.  Foods, especially peanuts, wheat, shellfish, milk, and eggs.  Insect bites or stings.  Blood products.  Chemicals, such as dyes, latex, and contrast material used for imaging tests. SYMPTOMS  When an allergic reaction occurs, the body releases histamine and other substances. These substances cause symptoms such as tightening of the airway. Symptoms often develop within seconds or minutes of exposure. Symptoms may include:  Skin rash or hives.  Itching.  Chest tightness.  Swelling of the eyes, tongue, or lips.  Trouble breathing or swallowing.  Lightheadedness or fainting.  Anxiety or confusion.  Stomach pains, vomiting, or diarrhea.  Nasal congestion.  A fast or irregular heartbeat (palpitations). DIAGNOSIS  Diagnosis is based on your history of recent exposure to allergic substances, your symptoms, and a physical exam. Your caregiver may also perform blood or urine tests to confirm the diagnosis. TREATMENT  Epinephrine medicine is the main treatment for an anaphylactic reaction. Other medicines that may be used for treatment include antihistamines, steroids, and albuterol. In severe cases, fluids and medicine to support blood pressure may be given through an intravenous line (IV). Even if you improve after treatment, you need to be observed to make sure your condition does not get  worse. This may require a stay in the hospital. HOME CARE INSTRUCTIONS   Wear a medical alert bracelet or necklace stating your allergy.  You and your family must learn how to use an anaphylaxis kit or give an epinephrine injection to temporarily treat an emergency allergic reaction. Always carry your epinephrine injection or anaphylaxis kit with you. This can be lifesaving if you have a severe reaction.  Do not drive or perform tasks after treatment until the medicines used to treat your reaction have worn off, or until your caregiver says it is okay.  If you have hives or a rash:  Take medicines as directed by your caregiver.  You may use an over-the-counter antihistamine (diphenhydramine) as needed.  Apply cold compresses to the skin or take baths in cool water. Avoid hot baths or showers. SEEK MEDICAL CARE IF:   You develop symptoms of an allergic reaction to a new substance. Symptoms may start right away or minutes later.  You develop a rash, hives, or itching.  You develop new symptoms. SEEK IMMEDIATE MEDICAL CARE IF:   You have swelling of the mouth, difficulty breathing, or wheezing.  You have a tight feeling in your chest or throat.  You develop hives, swelling, or itching all over your body.  You develop severe vomiting or diarrhea.  You feel faint or pass out. This is an emergency. Use your epinephrine injection or anaphylaxis kit as you have been instructed. Call your local emergency services (911 in [place] Even if you improve after the injection, you need to be examined at a hospital emergency department. MAKE SURE YOU:   Understand these instructions.  Will watch your condition.  Will get help right away if you are not   doing well or get worse.   This information is not intended to replace advice given to you by your health care provider. Make sure you discuss any questions you have with your health care provider.   Document Released: 07/23/2005 Document  Revised: 07/28/2013 Document Reviewed: 02/02/2015 Elsevier Interactive Patient Education 2016 [employer]  

## 2016-02-17 NOTE — Progress Notes (Signed)
Daily Session Note  Patient Details  Name: Jeffrey Hooper MRN: [id number] Date of Birth: 1960/08/20 Referring Provider:        CARDIAC REHAB PHASE II ORIENTATION from 01/03/2016 in [place]   Referring Provider  Dr. [name]      Encounter Date: 02/17/2016  Check In:     Session Check In - 02/17/16 0815    Check-In   Location [facility]   Staff Present Diane Angelina Pih, MS, EP, [facility], Exercise Physiologist;Atif Chapple Luther Parody, BS, EP, Exercise Physiologist;Debra Wynetta Emery, RN, BSN   Supervising physician immediately available to respond to emergencies See telemetry face sheet for immediately available MD   Medication changes reported     Yes   Comments He is having an allergic reaction to medication and we got him an appointment with his physican,.   Fall or balance concerns reported    No   Warm-up and Cool-down Performed as group-led instruction   Resistance Training Performed Yes   VAD Patient? No   Pain Assessment   Currently in Pain? No/denies   Pain Score 0-No pain   Multiple Pain Sites No      Capillary Blood Glucose: No results found for this or any previous visit (from the past 24 hour(s)).   Goals Met:  Independence with exercise equipment Exercise tolerated well No report of cardiac concerns or symptoms Strength training completed today  Goals Unmet:  Not Applicable  Comments: Check out 915   Dr. Kate Sable is Medical Director for [place] and Pulmonary Rehab.

## 2016-02-17 NOTE — Progress Notes (Signed)
Subjective:    Patient ID: Jeffrey Hooper, male    DOB: 07/15/1961, 55 y.o.   MRN: [id number]  HPI Pt presents to the office today with Hives on both feet, left hand, redness in his face. PT states he noticed it yesterday. PT states this has helped once in April after starting Brilinta. Pt denies any SOB, swelling of mouth or airway, or chest pain. PT denies any new contact with any new chemicals, detergents, or new foods.     Review of Systems  Constitutional: Negative.   HENT: Negative.   Respiratory: Negative.  Negative for apnea, chest tightness and shortness of breath.   Cardiovascular: Negative.  Negative for chest pain and leg swelling.  Gastrointestinal: Positive for nausea.  Endocrine: Negative.   Genitourinary: Negative.   Musculoskeletal: Negative.   Skin: Positive for rash.  Neurological: Negative.   Hematological: Negative.   Psychiatric/Behavioral: Negative.   All other systems reviewed and are negative.      Objective:   Physical Exam  Constitutional: He is oriented to person, place, and time. He appears well-developed and well-nourished. No distress.  HENT:  Head: Normocephalic.  Eyes: Pupils are equal, round, and reactive to light. Right eye exhibits no discharge. Left eye exhibits no discharge.  Neck: Normal range of motion. Neck supple. No thyromegaly present.  Cardiovascular: Normal rate, regular rhythm, normal heart sounds and intact distal pulses.   No murmur heard. Pulmonary/Chest: Effort normal and breath sounds normal. No respiratory distress. He has no wheezes.  Abdominal: Soft. Bowel sounds are normal. He exhibits no distension. There is no tenderness.  Musculoskeletal: Normal range of motion. He exhibits no edema or tenderness.  Neurological: He is alert and oriented to person, place, and time. He has normal reflexes. No cranial nerve deficit.  Skin: Skin is warm and dry. Rash noted. Rash is urticarial (on bilateral foot and ankle). No erythema.    Psychiatric: He has a normal mood and affect. His behavior is normal. Judgment and thought content normal.  Vitals reviewed.     BP 116/69 mmHg  Pulse 56  Temp(Src) 97.1 F (36.2 C) (Oral)  Ht 5' 9" (1.753 m)  Wt 208 lb (94.348 kg)  BMI 30.70 kg/m2     Assessment & Plan:  1. Allergic reaction, initial encounter - methylPREDNISolone acetate (DEPO-MEDROL) injection 80 mg; Inject 1 mL (80 mg total) into the muscle once. - EPINEPHrine 0.3 mg/0.3 mL IJ SOAJ injection; Inject 0.3 mLs (0.3 mg total) into the muscle once.  Dispense: 2 Device; Refill: 1 - Ambulatory referral to Allergy - ondansetron (ZOFRAN) 4 MG tablet; Take 1 tablet (4 mg total) by mouth every 8 (eight) hours as needed for nausea or vomiting.  Dispense: 20 tablet; Refill: 0 - Alpha-Gal Panel - CMP14+EGFR  2. Hives - methylPREDNISolone acetate (DEPO-MEDROL) injection 80 mg; Inject 1 mL (80 mg total) into the muscle once. - EPINEPHrine 0.3 mg/0.3 mL IJ SOAJ injection; Inject 0.3 mLs (0.3 mg total) into the muscle once.  Dispense: 2 Device; Refill: 1 - Ambulatory referral to Allergy - Alpha-Gal Panel - CMP14+EGFR  3. Nausea without vomiting - ondansetron (ZOFRAN) 4 MG tablet; Take 1 tablet (4 mg total) by mouth every 8 (eight) hours as needed for nausea or vomiting.  Dispense: 20 tablet; Refill: 0 - Alpha-Gal Panel - CMP14+EGFR  Discussed importance of use journal to try to figure triggers Continue benadryl Referral to allergy pending Keep Epi pen with him RTO Prn   Christy Hawks, FNP  

## 2016-02-20 ENCOUNTER — Encounter (HOSPITAL_COMMUNITY): Payer: BLUE CROSS/BLUE SHIELD

## 2016-02-22 ENCOUNTER — Other Ambulatory Visit: Payer: Self-pay | Admitting: Family

## 2016-02-22 ENCOUNTER — Encounter (HOSPITAL_COMMUNITY): Payer: BLUE CROSS/BLUE SHIELD

## 2016-02-22 LAB — ALPHA-GAL PANEL
Alpha Gal IgE*: 5.63 kU/L — ABNORMAL HIGH (ref <0.35)
BEEF (BOS SPP) IGE: 2.52 kU/L — AB (ref <0.35)
BEEF CLASS INTERPRETATION: 2
Class Interpretation: 2
LAMB CLASS INTERPRETATION: 2
Lamb/Mutton (Ovis spp) IgE: 1.47 kU/L — ABNORMAL HIGH (ref <0.35)
PORK (SUS SPP) IGE: 2.03 kU/L — AB (ref <0.35)

## 2016-02-22 LAB — CMP14+EGFR
ALBUMIN: 4.3 g/dL (ref 3.5–5.5)
ALT: 34 IU/L (ref 0–44)
AST: 21 IU/L (ref 0–40)
Albumin/Globulin Ratio: 2.3 — ABNORMAL HIGH (ref 1.2–2.2)
Alkaline Phosphatase: 96 IU/L (ref 39–117)
BILIRUBIN TOTAL: 0.9 mg/dL (ref 0.0–1.2)
BUN / CREAT RATIO: 13 (ref 9–20)
BUN: 14 mg/dL (ref 6–24)
CO2: 26 mmol/L (ref 18–29)
CREATININE: 1.08 mg/dL (ref 0.76–1.27)
Calcium: 9.3 mg/dL (ref 8.7–10.2)
Chloride: 96 mmol/L (ref 96–106)
GFR calc non Af Amer: 77 mL/min/{1.73_m2} (ref >59)
GFR, EST AFRICAN AMERICAN: 89 mL/min/{1.73_m2} (ref >59)
GLOBULIN, TOTAL: 1.9 g/dL (ref 1.5–4.5)
GLUCOSE: 96 mg/dL (ref 65–99)
Potassium: 3.4 mmol/L — ABNORMAL LOW (ref 3.5–5.2)
SODIUM: 139 mmol/L (ref 134–144)
TOTAL PROTEIN: 6.2 g/dL (ref 6.0–8.5)

## 2016-02-24 ENCOUNTER — Encounter (HOSPITAL_COMMUNITY): Payer: BLUE CROSS/BLUE SHIELD

## 2016-02-27 ENCOUNTER — Encounter (HOSPITAL_COMMUNITY)
Admission: RE | Admit: 2016-02-27 | Discharge: 2016-02-27 | Disposition: A | Discharge status: Home / Self Care | Payer: BLUE CROSS/BLUE SHIELD | Source: Ambulatory Visit | Attending: Cardiology | Admitting: Cardiology

## 2016-02-27 DIAGNOSIS — Z955 Presence of coronary angioplasty implant and graft: Secondary | ICD-10-CM | POA: Diagnosis not present

## 2016-02-27 NOTE — Progress Notes (Signed)
Daily Session Note  Patient Details  Name: Jeffrey Hooper MRN: [id number] Date of Birth: Aug 16, 1960 Referring Provider:   Flowsheet Row CARDIAC REHAB PHASE II ORIENTATION from 01/03/2016 in [place]  Referring Provider  Dr. [name]      Encounter Date: 02/27/2016  Check In:     Session Check In - 02/27/16 0815      Check-In   Location [facility]   Staff Present Diane Angelina Pih, MS, EP, [facility], Exercise Physiologist;Gregory Luther Parody, BS, EP, Exercise Physiologist;Telitha Plath Wynetta Emery, RN, BSN   Supervising physician immediately available to respond to emergencies See telemetry face sheet for immediately available MD   Medication changes reported     No   Fall or balance concerns reported    No   Warm-up and Cool-down Performed as group-led instruction   Resistance Training Performed Yes   VAD Patient? No     Pain Assessment   Currently in Pain? No/denies   Pain Score 0-No pain   Multiple Pain Sites No      Capillary Blood Glucose: No results found for this or any previous visit (from the past 24 hour(s)).   Goals Met:  Independence with exercise equipment Exercise tolerated well No report of cardiac concerns or symptoms Strength training completed today  Goals Unmet:  Not Applicable  Comments: Check out 915.   Dr. Kate Sable is Medical Director for [name] Cardiac and Pulmonary Rehab.

## 2016-02-28 ENCOUNTER — Telehealth: Payer: Self-pay | Admitting: Family

## 2016-02-28 NOTE — Telephone Encounter (Signed)
Patient aware that referral has been put in just waiting to get an appt.

## 2016-02-29 ENCOUNTER — Encounter (HOSPITAL_COMMUNITY)
Admission: RE | Admit: 2016-02-29 | Discharge: 2016-02-29 | Disposition: A | Discharge status: Home / Self Care | Payer: BLUE CROSS/BLUE SHIELD | Source: Ambulatory Visit | Attending: Cardiology | Admitting: Cardiology

## 2016-02-29 DIAGNOSIS — Z955 Presence of coronary angioplasty implant and graft: Secondary | ICD-10-CM

## 2016-02-29 NOTE — Progress Notes (Signed)
Daily Session Note  Patient Details  Name: Jeffrey Hooper MRN: [id number] Date of Birth: 1961/07/12 Referring Provider:   Flowsheet Row CARDIAC REHAB PHASE II ORIENTATION from 01/03/2016 in [place]  Referring Provider  Dr. [name]      Encounter Date: 02/29/2016  Check In:     Session Check In - 02/29/16 0815      Check-In   Location [facility]   Staff Present Aundra Dubin, RN, BSN;Diane Coad, MS, EP, [facility], Exercise Physiologist;Danaiya Steadman Luther Parody, BS, EP, Exercise Physiologist   Supervising physician immediately available to respond to emergencies See telemetry face sheet for immediately available MD   Medication changes reported     No   Fall or balance concerns reported    No   Warm-up and Cool-down Performed as group-led instruction   Resistance Training Performed Yes   VAD Patient? No     Pain Assessment   Currently in Pain? No/denies   Pain Score 0-No pain   Multiple Pain Sites No      Capillary Blood Glucose: No results found for this or any previous visit (from the past 24 hour(s)).   Goals Met:  Independence with exercise equipment Exercise tolerated well No report of cardiac concerns or symptoms Strength training completed today  Goals Unmet:  Not Applicable  Comments: Check out 915   Dr. Kate Sable is Medical Director for [place] and Pulmonary Rehab.

## 2016-03-01 NOTE — Progress Notes (Signed)
Cardiac Individual Treatment Plan  Patient Details  Name: Jeffrey Hooper MRN: [id number] Date of Birth: [birth year] Referring Provider:   Flowsheet Row CARDIAC REHAB PHASE II ORIENTATION from 01/03/2016 in [place]  Referring Provider  Dr. [name]      Initial Encounter Date:  Flowsheet Row CARDIAC REHAB PHASE II ORIENTATION from 01/03/2016 in [place]  Date  01/03/16  Referring Provider  Dr. [name]      Visit Diagnosis: Stented coronary artery  Patient's Home Medications on Admission:  Current Outpatient Prescriptions:  .  acetaminophen (TYLENOL) 500 MG tablet, Take 1,000 mg by mouth every 6 (six) hours as needed (pain). , Disp: , Rfl:  .  amLODipine (NORVASC) 5 MG tablet, Take 1 tablet (5 mg total) by mouth daily., Disp: 90 tablet, Rfl: 3 .  aspirin EC 81 MG tablet, Take 81 mg by mouth at bedtime., Disp: , Rfl:  .  atorvastatin (LIPITOR) 80 MG tablet, Take 1 tablet (80 mg total) by mouth daily. (Patient taking differently: Take 80 mg by mouth daily at 6 PM. ), Disp: 90 tablet, Rfl: 3 .  clopidogrel (PLAVIX) 75 MG tablet, Take 1 tablet (75 mg total) by mouth daily., Disp: 30 tablet, Rfl: 11 .  diphenhydrAMINE (BENADRYL) 25 MG tablet, Take 50 mg by mouth every 6 (six) hours as needed for itching., Disp: , Rfl:  .  EPINEPHrine 0.3 mg/0.3 mL IJ SOAJ injection, Inject 0.3 mLs (0.3 mg total) into the muscle once., Disp: 2 Device, Rfl: 1 .  esomeprazole (NEXIUM) 40 MG capsule, Take 1 capsule (40 mg total) by mouth daily at 12 noon. (Patient taking differently: Take 40 mg by mouth daily with breakfast. ), Disp: 90 capsule, Rfl: 3 .  hydrochlorothiazide (HYDRODIURIL) 25 MG tablet, Take 1 tablet (25 mg total) by mouth daily., Disp: 90 tablet, Rfl: 1 .  levothyroxine (SYNTHROID, LEVOTHROID) 100 MCG tablet, Take 1 tablet (100 mcg total) by mouth daily., Disp: 90 tablet, Rfl: 1 .  Melatonin 10 MG TABS, Take 10 mg by mouth at bedtime. , Disp: ,  Rfl:  .  metoprolol tartrate (LOPRESSOR) 25 MG tablet, Take 0.5 tablets (12.5 mg total) by mouth 2 (two) times daily., Disp: 30 tablet, Rfl: 5 .  nitroGLYCERIN (NITROSTAT) 0.4 MG SL tablet, Place 1 tablet (0.4 mg total) under the tongue every 5 (five) minutes as needed for chest pain., Disp: 50 tablet, Rfl: 0 .  ondansetron (ZOFRAN) 4 MG tablet, Take 1 tablet (4 mg total) by mouth every 8 (eight) hours as needed for nausea or vomiting., Disp: 20 tablet, Rfl: 0  Past Medical History: Past Medical History:  Diagnosis Date  . Allergy   . Anginal pain ([place])   . Arthritis   . CAD (coronary artery disease), native coronary artery 12/03/2015  . Coronary artery disease   . Dysrhythmia    "irregular and PAC'S"  . GERD (gastroesophageal reflux disease)   . Headache   . Hyperlipidemia   . Hypertension   . Hypothyroid   . Multiple benign nevi   . Neuromuscular disorder ([place]) 2011   LEFT RADIAL NERVE SURGERY R/T TRAUMA  . Pneumonia 2014, 2017  . Prostatism   . Pulmonary nodule   . Seasonal allergies     Tobacco Use: History  Smoking Status  . Former Smoker  . Packs/day: 1.00  . Years: 20.00  . Types: Cigarettes  . Quit date: 11/28/2006  Smokeless Tobacco  . Never Used    Labs: Recent Review  Flowsheet Data    Labs for ITP Cardiac and Pulmonary Rehab Latest Ref Rng & Units 06/25/2012 10/29/2012 04/28/2013 08/07/2013 05/18/2015   Cholestrol 100 - 199 mg/dL - 136 122 127 -   LDLCALC 0 - 99 mg/dL - 74 50 65 -   HDL >39 mg/dL - 41 44 45 -   Trlycerides 0 - 149 mg/dL - 103 139 83 -   Hemoglobin A1c 4.8 - 5.6 % - - - - 5.8(H)   TCO2 0 - 100 mmol/L 27 - - - -      Capillary Blood Glucose: No results found for: GLUCAP   Exercise Target Goals:    Exercise Program Goal: Individual exercise prescription set with THRR, safety & activity barriers. Participant demonstrates ability to understand and report RPE using BORG scale, to self-measure pulse accurately, and to acknowledge the  importance of the exercise prescription.  Exercise Prescription Goal: Starting with aerobic activity 30 plus minutes a day, 3 days per week for initial exercise prescription. Provide home exercise prescription and guidelines that participant acknowledges understanding prior to discharge.  Activity Barriers & Risk Stratification:     Activity Barriers & Cardiac Risk Stratification - 01/03/16 1757      Activity Barriers & Cardiac Risk Stratification   Activity Barriers None   Cardiac Risk Stratification High      6 Minute Walk:     6 Minute Walk    Row Name 01/03/16 1802         6 Minute Walk   Phase Initial     Distance 1300 feet     Walk Time 6 minutes     # of Rest Breaks 0     MPH 2.46     METS 2.88     RPE 11     Perceived Dyspnea  12     VO2 Peak 12.2     Symptoms No     Resting HR 51 bpm     Resting BP 142/62     Max Ex. HR 82 bpm     Max Ex. BP 144/74     2 Minute Post BP 118/62        Initial Exercise Prescription:     Initial Exercise Prescription - 01/03/16 1800      Date of Initial Exercise RX and Referring Provider   Date 01/03/16   Referring Provider Dr. [name]     Treadmill   MPH 1.5   Grade 0   Minutes 15   METs 2.1     T5 Nustep   Level 2   Watts 25   Minutes 20   METs 1.9     Prescription Details   Frequency (times per week) 3   Duration Progress to 30 minutes of continuous aerobic without signs/symptoms of physical distress     Intensity   THRR REST +  30   THRR 40-80% of Max Heartrate [phone]   Ratings of Perceived Exertion 11-13   Perceived Dyspnea 0-4     Progression   Progression Continue to progress workloads to maintain intensity without signs/symptoms of physical distress.     Resistance Training   Training Prescription Yes   Weight 1   Reps 10-12      Perform Capillary Blood Glucose checks as needed.  Exercise Prescription Changes:      Exercise Prescription Changes    Row Name 01/11/16 1400  01/18/16 0800 01/23/16 1400 01/30/16 1700 02/09/16 0700     Exercise Review  Progression Yes Yes Yes  -  -     Response to Exercise   Blood Pressure (Admit) 112/60 112/60 98/64 100/60 120/64   Blood Pressure (Exercise) 130/60 130/60 150/60 154/76 130/62   Blood Pressure (Exit) 108/60 108/60 108/54 106/56 110/62   Heart Rate (Admit) 50 bpm 50 bpm 53 bpm 65 bpm 60 bpm   Heart Rate (Exercise) 83 bpm 83 bpm 85 bpm 95 bpm 81 bpm   Heart Rate (Exit) 72 bpm 72 bpm 61 bpm 62 bpm 68 bpm   Rating of Perceived Exertion (Exercise)  -  -  -  - 8   Duration  -  -  -  - Progress to 30 minutes of continuous aerobic without signs/symptoms of physical distress   Intensity  -  -  -  - Rest + 30     Progression   Progression  -  -  -  - Continue to progress workloads to maintain intensity without signs/symptoms of physical distress.     Resistance Training   Training Prescription Yes Yes Yes Yes Yes   Weight 1 1 1 2 2    Reps 10-12 10-12 10-12 10-12 10-12     Treadmill   MPH 1.9 2.1 2.2 2.2 2.2   Grade 0 0 0 0 0   Minutes 15 15 15 15 20    METs 2.2 2.3 2.3 2.6 2.7     T5 Nustep   Level 3 3 3 3 3    Watts 35 35 35 86 56   Minutes 20 20 20 20 20    METs 1.9 1.9 1.9 4.4 3.64     Home Exercise Plan   Plans to continue exercise at  -  -  - Home Home   Frequency  -  -  - Add 2 additional days to program exercise sessions. Add 2 additional days to program exercise sessions.   [place] Name 02/23/16 1000 02/29/16 1500           Exercise Review   Progression No Yes        Response to Exercise   Blood Pressure (Admit) 122/68 110/60      Blood Pressure (Exercise) 128/62 160/82      Blood Pressure (Exit) 112/42 134/66      Heart Rate (Admit) 51 bpm 52 bpm      Heart Rate (Exercise) 73 bpm 95 bpm      Heart Rate (Exit) 60 bpm 69 bpm      Rating of Perceived Exertion (Exercise) 10 10      Comments Patient did not progress much due to a reaction to his medication. Wwe contacted his physician and had an  appointment scheduled.   -      Duration Progress to 30 minutes of continuous aerobic without signs/symptoms of physical distress Progress to 30 minutes of continuous aerobic without signs/symptoms of physical distress      Intensity Rest + 30 Rest + 30        Progression   Progression Continue to progress workloads to maintain intensity without signs/symptoms of physical distress. Continue to progress workloads to maintain intensity without signs/symptoms of physical distress.        Resistance Training   Training Prescription Yes Yes      Weight 2 2      Reps 10-12 10-12        Treadmill   MPH 2.2 2.6      Grade 0 0      Minutes  20 20      METs 2.6 2.9        T5 Nustep   Level 3 3      Watts 32 78      Minutes 15 15      METs 3.64 4.2        Home Exercise Plan   Plans to continue exercise at [place] 2 additional days to program exercise sessions. Add 2 additional days to program exercise sessions.         Exercise Comments:      Exercise Comments    Row Name 02/29/16 1502           Exercise Comments Patient is progressing appropriately            Discharge Exercise Prescription (Final Exercise Prescription Changes):     Exercise Prescription Changes - 02/29/16 1500      Exercise Review   Progression Yes     Response to Exercise   Blood Pressure (Admit) 110/60   Blood Pressure (Exercise) 160/82   Blood Pressure (Exit) 134/66   Heart Rate (Admit) 52 bpm   Heart Rate (Exercise) 95 bpm   Heart Rate (Exit) 69 bpm   Rating of Perceived Exertion (Exercise) 10   Duration Progress to 30 minutes of continuous aerobic without signs/symptoms of physical distress   Intensity Rest + 30     Progression   Progression Continue to progress workloads to maintain intensity without signs/symptoms of physical distress.     Resistance Training   Training Prescription Yes   Weight 2   Reps 10-12     Treadmill   MPH 2.6   Grade 0   Minutes 20    METs 2.9     T5 Nustep   Level 3   Watts 78   Minutes 15   METs 4.2     Home Exercise Plan   Plans to continue exercise at Home   Frequency Add 2 additional days to program exercise sessions.      Nutrition:  Target Goals: Understanding of nutrition guidelines, daily intake of sodium 1500mg , cholesterol 200mg , calories 30% from fat and 7% or less from saturated fats, daily to have 5 or more servings of fruits and vegetables.  Biometrics:     Pre Biometrics - 01/03/16 1807      Pre Biometrics   Height 5\' 9"  (1.753 m)   Weight 211 lb 3.2 oz (95.8 kg)   Waist Circumference 39.7 inches   Hip Circumference 42 inches   Waist to Hip Ratio 0.95 %   BMI (Calculated) 31.3   Triceps Skinfold 12 mm   % Body Fat 27.2 %   Grip Strength 117 kg   Flexibility 18 in   Single Leg Stand 30 seconds       Nutrition Therapy Plan and Nutrition Goals:   Nutrition Discharge: Rate Your Plate Scores:     Nutrition Assessments - 01/03/16 1807      MEDFICTS Scores   Pre Score 42      Nutrition Goals Re-Evaluation:   Psychosocial: Target Goals: Acknowledge presence or absence of depression, maximize coping skills, provide positive support system. Participant is able to verbalize types and ability to use techniques and skills needed for reducing stress and depression.  Initial Review & Psychosocial Screening:     Initial Psych Review & Screening - 01/03/16 [place]?  Yes     Barriers   Psychosocial barriers to participate in program There are no identifiable barriers or psychosocial needs.     Screening Interventions   Interventions Encouraged to exercise      Quality of Life Scores:     Quality of Life - 01/03/16 1813      Quality of Life Scores   Health/Function Pre 24 %   Socioeconomic Pre 28.29 %   Psych/Spiritual Pre 28.29 %   Family Pre 25.2 %   GLOBAL Pre 25.94 %      PHQ-9: Recent Review Flowsheet Data     Depression screen [facility] 2/9 02/17/2016 01/03/2016 10/05/2015 08/17/2015 07/18/2015   Decreased Interest 0 0 0 0 0   Down, Depressed, Hopeless 0 0 0 0 0   PHQ - 2 Score 0 0 0 0 0   Altered sleeping - 1 - - -   Tired, decreased energy - 0 - - -   Change in appetite - 0 - - -   Feeling bad or failure about yourself  - 0 - - -   Trouble concentrating - 0 - - -   Moving slowly or fidgety/restless - 0 - - -   Suicidal thoughts - 0 - - -   PHQ-9 Score - 1 - - -   Difficult doing work/chores - Not difficult at all - - -      Psychosocial Evaluation and Intervention:   Psychosocial Re-Evaluation:     Psychosocial Re-Evaluation    Row Name 01/30/16 1756 03/01/16 0739           Psychosocial Re-Evaluation   Interventions Encouraged to attend Cardiac Rehabilitation for the exercise  -      Comments  - Patient does not have psysocial issues. QOL score 25.94 and PHQ9 score is 1. Does not need counseling.       Continued Psychosocial Services Needed No No         Vocational Rehabilitation: Provide vocational rehab assistance to qualifying candidates.   Vocational Rehab Evaluation & Intervention:     Vocational Rehab - 01/03/16 1759      Initial Vocational Rehab Evaluation & Intervention   Assessment shows need for Vocational Rehabilitation No      Education: Education Goals: Education classes will be provided on a weekly basis, covering required topics. Participant will state understanding/return demonstration of topics presented.  Learning Barriers/Preferences:     Learning Barriers/Preferences - 01/03/16 1759      Learning Barriers/Preferences   Learning Barriers None   Learning Preferences Skilled Demonstration  Reading      Education Topics: Hypertension, Hypertension Reduction -Define heart disease and high blood pressure. Discus how high blood pressure affects the body and ways to reduce high blood pressure.   Exercise and Your Heart -Discuss why it is important to  exercise, the FITT principles of exercise, normal and abnormal responses to exercise, and how to exercise safely.   Angina -Discuss definition of angina, causes of angina, treatment of angina, and how to decrease risk of having angina.   Cardiac Medications -Review what the following cardiac medications are used for, how they affect the body, and side effects that may occur when taking the medications.  Medications include Aspirin, Beta blockers, calcium channel blockers, ACE Inhibitors, angiotensin receptor blockers, diuretics, digoxin, and antihyperlipidemics.   Congestive Heart Failure -Discuss the definition of CHF, how to live with CHF, the signs and symptoms of CHF, and how keep track of weight and  sodium intake.   Heart Disease and Intimacy -Discus the effect sexual activity has on the heart, how changes occur during intimacy as we age, and safety during sexual activity. Flowsheet Row CARDIAC REHAB PHASE II EXERCISE from 02/08/2016 in [place]  Date  01/11/16  Educator  DC  Instruction Review Code  2- meets goals/outcomes      Smoking Cessation / COPD -Discuss different methods to quit smoking, the health benefits of quitting smoking, and the definition of COPD. Flowsheet Row CARDIAC REHAB PHASE II EXERCISE from 02/08/2016 in [place]  Date  01/18/16  Educator  Russella Dar  Instruction Review Code  2- meets goals/outcomes      Nutrition I: Fats -Discuss the types of cholesterol, what cholesterol does to the heart, and how cholesterol levels can be controlled. Flowsheet Row CARDIAC REHAB PHASE II EXERCISE from 02/08/2016 in [place]  Date  01/25/16  Educator  Russella Dar  Instruction Review Code  2- meets goals/outcomes      Nutrition II: Labels -Discuss the different components of food labels and how to read food label [place] from 02/08/2016 in [place]  Date  02/01/16  Educator  Russella Dar  Instruction Review Code  2- meets goals/outcomes      Heart Parts and Heart Disease -Discuss the anatomy of the heart, the pathway of blood circulation through the heart, and these are affected by heart disease. Flowsheet Row CARDIAC REHAB PHASE II EXERCISE from 02/08/2016 in [place]  Date  02/08/16  Educator  DC  Instruction Review Code  2- meets goals/outcomes      Stress I: Signs and Symptoms -Discuss the causes of stress, how stress may lead to anxiety and depression, and ways to limit stress.   Stress II: Relaxation -Discuss different types of relaxation techniques to limit stress.   Warning Signs of Stroke / TIA -Discuss definition of a stroke, what the signs and symptoms are of a stroke, and how to identify when someone is having stroke.   Knowledge Questionnaire Score:     Knowledge Questionnaire Score - 01/03/16 1759      Knowledge Questionnaire Score   Pre Score 21/24      Core Components/Risk Factors/Patient Goals at Admission:     Personal Goals and Risk Factors at Admission - 01/03/16 1810      Core Components/Risk Factors/Patient Goals on Admission    Weight Management Weight Loss   Sedentary --  No   Increase Strength and Stamina Yes   Intervention Provide advice, education, support and counseling about physical activity/exercise needs.   Expected Outcomes Achievement of increased cardiorespiratory fitness and enhanced flexibility, muscular endurance and strength shown through measurements of functional capacity and personal statement of participant.   Tobacco Cessation --  No   Hypertension Yes   Intervention Provide education on lifestyle modifcations including regular physical activity/exercise, weight management, moderate sodium restriction and increased consumption of fresh fruit, vegetables, and low fat dairy, alcohol moderation, and smoking cessation.   Expected  Outcomes Short Term: Continued assessment and intervention until BP is < 140/16mm HG in hypertensive participants. < 130/47mm HG in hypertensive participants with diabetes, heart failure or chronic kidney disease.;Long Term: Maintenance of blood pressure at goal levels.   Personal Goal Other Yes   Personal Goal No more episodes of SOB, Make sure heart is back to normal.    Intervention come to class  3xwee and to suppliment with 2 days outside of CR class.    Expected Outcomes To feel better, less SOB, live heart healthy lifestyle.       Core Components/Risk Factors/Patient Goals Review:      Goals and Risk Factor Review    Row Name 01/30/16 1754 03/01/16 0735           Core Components/Risk Factors/Patient Goals Review   Personal Goals Review Improve shortness of breath with ADL's;Increase Strength and Stamina Improve shortness of breath with ADL's;Increase Strength and Stamina      Review Improve SOB with exertion, get back to normal with ADL's and heart health in general.  Patient is doing well in the program. Patient is having less SOB and is getting stronger.      Expected Outcomes Little to no SOB with ADL's Continue to increase stamina and strength with improved SOB.          Core Components/Risk Factors/Patient Goals at Discharge (Final Review):      Goals and Risk Factor Review - 03/01/16 0735      Core Components/Risk Factors/Patient Goals Review   Personal Goals Review Improve shortness of breath with ADL's;Increase Strength and Stamina   Review Patient is doing well in the program. Patient is having less SOB and is getting stronger.   Expected Outcomes Continue to increase stamina and strength with improved SOB.       ITP Comments:   Comments: Patient doing well with program. Will continue to monitor for progress.

## 2016-03-02 ENCOUNTER — Encounter (HOSPITAL_COMMUNITY)
Admission: RE | Admit: 2016-03-02 | Discharge: 2016-03-02 | Disposition: A | Discharge status: Home / Self Care | Payer: BLUE CROSS/BLUE SHIELD | Source: Ambulatory Visit | Attending: Cardiology | Admitting: Cardiology

## 2016-03-02 DIAGNOSIS — Z955 Presence of coronary angioplasty implant and graft: Secondary | ICD-10-CM

## 2016-03-02 NOTE — Progress Notes (Signed)
Daily Session Note  Patient Details  Name: Jeffrey Hooper MRN: [id number] Date of Birth: [birth year] Referring Provider:   Flowsheet Row CARDIAC REHAB PHASE II ORIENTATION from 01/03/2016 in [place]  Referring Provider  Dr. [name]      Encounter Date: 03/02/2016  Check In:     Session Check In - 03/02/16 0815      Check-In   Location [facility]   Staff Present Diane Angelina Pih, MS, EP, [facility], Exercise Physiologist;Gregory Luther Parody, BS, EP, Exercise Physiologist;Issa Kosmicki Wynetta Emery, RN, BSN   Supervising physician immediately available to respond to emergencies See telemetry face sheet for immediately available MD   Medication changes reported     No   Fall or balance concerns reported    No   Warm-up and Cool-down Performed as group-led instruction   Resistance Training Performed Yes   VAD Patient? No     Pain Assessment   Currently in Pain? No/denies   Pain Score 0-No pain   Multiple Pain Sites No      Capillary Blood Glucose: No results found for this or any previous visit (from the past 24 hour(s)).   Goals Met:  Independence with exercise equipment Exercise tolerated well No report of cardiac concerns or symptoms Strength training completed today  Goals Unmet:  Not Applicable  Comments: Check out 915.   Dr. Kate Sable is Medical Director for [place].

## 2016-03-05 ENCOUNTER — Encounter (HOSPITAL_COMMUNITY)
Admission: RE | Admit: 2016-03-05 | Discharge: 2016-03-05 | Disposition: A | Discharge status: Home / Self Care | Payer: BLUE CROSS/BLUE SHIELD | Source: Ambulatory Visit | Attending: Cardiology | Admitting: Cardiology

## 2016-03-05 DIAGNOSIS — Z955 Presence of coronary angioplasty implant and graft: Secondary | ICD-10-CM | POA: Diagnosis not present

## 2016-03-05 NOTE — Progress Notes (Signed)
Daily Session Note  Patient Details  Name: Jeffrey Hooper MRN: [id number] Date of Birth: 1961-07-09 Referring Provider:   Flowsheet Row CARDIAC REHAB PHASE II ORIENTATION from 01/03/2016 in [place]  Referring Provider  Dr. [name]      Encounter Date: 03/05/2016  Check In:     Session Check In - 03/05/16 0815      Check-In   Location [facility]   Staff Present Russella Dar, MS, EP, [facility], Exercise Physiologist;Debra Wynetta Emery, RN, BSN;Monita Swier, BS, EP, Exercise Physiologist   Supervising physician immediately available to respond to emergencies See telemetry face sheet for immediately available MD   Medication changes reported     No   Fall or balance concerns reported    No   Warm-up and Cool-down Performed as group-led instruction   Resistance Training Performed Yes   VAD Patient? No     Pain Assessment   Currently in Pain? No/denies   Pain Score 0-No pain   Multiple Pain Sites No      Capillary Blood Glucose: No results found for this or any previous visit (from the past 24 hour(s)).   Goals Met:  Independence with exercise equipment Exercise tolerated well No report of cardiac concerns or symptoms Strength training completed today  Goals Unmet:  Not Applicable  Comments: Check out 915   Dr. Kate Sable is Medical Director for [place].

## 2016-03-07 ENCOUNTER — Encounter (HOSPITAL_COMMUNITY): Payer: BLUE CROSS/BLUE SHIELD

## 2016-03-09 ENCOUNTER — Encounter (HOSPITAL_COMMUNITY)
Admission: RE | Admit: 2016-03-09 | Discharge: 2016-03-09 | Disposition: A | Discharge status: Home / Self Care | Payer: BLUE CROSS/BLUE SHIELD | Source: Ambulatory Visit | Attending: Cardiology | Admitting: Cardiology

## 2016-03-09 DIAGNOSIS — Z955 Presence of coronary angioplasty implant and graft: Secondary | ICD-10-CM | POA: Insufficient documentation

## 2016-03-09 NOTE — Progress Notes (Signed)
Daily Session Note  Patient Details  Name: Jeffrey Hooper MRN: [id number] Date of Birth: 13-Aug-1960 Referring Provider:   Flowsheet Row CARDIAC REHAB PHASE II ORIENTATION from 01/03/2016 in [place]  Referring Provider  Dr. [name]      Encounter Date: 03/09/2016  Check In:     Session Check In - 03/09/16 0815      Check-In   Location [facility]   Staff Present Russella Dar, MS, EP, [facility], Exercise Physiologist;Quentez Lober Wynetta Emery, RN, BSN   Supervising physician immediately available to respond to emergencies See telemetry face sheet for immediately available MD   Medication changes reported     No   Fall or balance concerns reported    No   Warm-up and Cool-down Performed as group-led instruction   Resistance Training Performed Yes   VAD Patient? No     Pain Assessment   Currently in Pain? No/denies   Pain Score 0-No pain   Multiple Pain Sites No      Capillary Blood Glucose: No results found for this or any previous visit (from the past 24 hour(s)).   Goals Met:  Independence with exercise equipment Exercise tolerated well No report of cardiac concerns or symptoms Strength training completed today  Goals Unmet:  Not Applicable  Comments: Check out 915.    Dr. Kate Sable is Medical Director for [facility] Cardiac and Pulmonary Rehab.

## 2016-03-12 ENCOUNTER — Encounter (HOSPITAL_COMMUNITY)
Admission: RE | Admit: 2016-03-12 | Discharge: 2016-03-12 | Disposition: A | Discharge status: Home / Self Care | Payer: BLUE CROSS/BLUE SHIELD | Source: Ambulatory Visit | Attending: Cardiology | Admitting: Cardiology

## 2016-03-12 DIAGNOSIS — Z955 Presence of coronary angioplasty implant and graft: Secondary | ICD-10-CM

## 2016-03-12 NOTE — Progress Notes (Signed)
Daily Session Note  Patient Details  Name: Jeffrey Hooper MRN: [id number] Date of Birth: 1961/04/03 Referring Provider:   Flowsheet Row CARDIAC REHAB PHASE II ORIENTATION from 01/03/2016 in [place]  Referring Provider  Dr. [name]      Encounter Date: 03/12/2016  Check In:     Session Check In - 03/12/16 0815      Check-In   Location [facility]   Staff Present Russella Dar, MS, EP, [facility], Exercise Physiologist;Debra Wynetta Emery, RN, BSN;Gregory Cowan, BS, EP, Exercise Physiologist   Supervising physician immediately available to respond to emergencies See telemetry face sheet for immediately available MD   Medication changes reported     No   Fall or balance concerns reported    No   Warm-up and Cool-down Performed as group-led instruction   Resistance Training Performed Yes   VAD Patient? No     Pain Assessment   Currently in Pain? No/denies   Pain Score 0-No pain   Multiple Pain Sites No      Capillary Blood Glucose: No results found for this or any previous visit (from the past 24 hour(s)).   Goals Met:  Independence with exercise equipment Exercise tolerated well No report of cardiac concerns or symptoms Strength training completed today  Goals Unmet:  Not Applicable  Comments: Check out 915   Dr. Kate Sable is Medical Director for [place] and Pulmonary Rehab.

## 2016-03-14 ENCOUNTER — Encounter (HOSPITAL_COMMUNITY)
Admission: RE | Admit: 2016-03-14 | Discharge: 2016-03-14 | Disposition: A | Discharge status: Home / Self Care | Payer: BLUE CROSS/BLUE SHIELD | Source: Ambulatory Visit | Attending: Cardiology | Admitting: Cardiology

## 2016-03-14 DIAGNOSIS — Z955 Presence of coronary angioplasty implant and graft: Secondary | ICD-10-CM | POA: Diagnosis not present

## 2016-03-14 NOTE — Progress Notes (Signed)
Daily Session Note  Patient Details  Name: Jeffrey Hooper MRN: [id number] Date of Birth: 23-Feb-1961 Referring Provider:   Flowsheet Row CARDIAC REHAB PHASE II ORIENTATION from 01/03/2016 in [place]  Referring Provider  Dr. [name]      Encounter Date: 03/14/2016  Check In:     Session Check In - 03/14/16 0815      Check-In   Location [facility]   Staff Present Russella Dar, MS, EP, [facility], Exercise Physiologist;Debra Wynetta Emery, RN, BSN;Gregory Cowan, BS, EP, Exercise Physiologist   Supervising physician immediately available to respond to emergencies See telemetry face sheet for immediately available MD   Medication changes reported     No   Fall or balance concerns reported    No   Warm-up and Cool-down Performed as group-led instruction   Resistance Training Performed Yes   VAD Patient? No     Pain Assessment   Currently in Pain? No/denies   Pain Score 0-No pain   Multiple Pain Sites No      Capillary Blood Glucose: No results found for this or any previous visit (from the past 24 hour(s)).   Goals Met:  Independence with exercise equipment Exercise tolerated well No report of cardiac concerns or symptoms Strength training completed today  Goals Unmet:  Not Applicable  Comments: Check out: 0915   Dr. Kate Sable is Medical Director for [place] and Pulmonary Rehab.

## 2016-03-16 ENCOUNTER — Encounter (HOSPITAL_COMMUNITY)
Admission: RE | Admit: 2016-03-16 | Discharge: 2016-03-16 | Disposition: A | Discharge status: Home / Self Care | Payer: BLUE CROSS/BLUE SHIELD | Source: Ambulatory Visit | Attending: Cardiology | Admitting: Cardiology

## 2016-03-16 DIAGNOSIS — Z955 Presence of coronary angioplasty implant and graft: Secondary | ICD-10-CM | POA: Diagnosis not present

## 2016-03-16 NOTE — Progress Notes (Signed)
Daily Session Note  Patient Details  Name: Jeffrey Hooper MRN: [id number] Date of Birth: 1960/09/21 Referring Provider:   Flowsheet Row CARDIAC REHAB PHASE II ORIENTATION from 01/03/2016 in [place]  Referring Provider  Dr. [name]      Encounter Date: 03/16/2016  Check In:     Session Check In - 03/16/16 0815      Check-In   Location [facility]   Staff Present Russella Dar, MS, EP, [facility], Exercise Physiologist;Debra Wynetta Emery, RN, BSN;Santina Trillo, BS, EP, Exercise Physiologist   Supervising physician immediately available to respond to emergencies See telemetry face sheet for immediately available MD   Medication changes reported     No   Fall or balance concerns reported    No   Warm-up and Cool-down Performed as group-led instruction   Resistance Training Performed Yes   VAD Patient? No     Pain Assessment   Currently in Pain? No/denies   Pain Score 0-No pain   Multiple Pain Sites No      Capillary Blood Glucose: No results found for this or any previous visit (from the past 24 hour(s)).      Exercise Prescription Changes - 03/15/16 1200      Exercise Review   Progression Yes     Response to Exercise   Blood Pressure (Admit) 110/56   Blood Pressure (Exercise) 124/62   Blood Pressure (Exit) 108/60   Heart Rate (Admit) 48 bpm   Heart Rate (Exercise) 87 bpm   Heart Rate (Exit) 57 bpm   Rating of Perceived Exertion (Exercise) 9   Duration Progress to 30 minutes of continuous aerobic without signs/symptoms of physical distress   Intensity Rest + 30     Progression   Progression Continue to progress workloads to maintain intensity without signs/symptoms of physical distress.     Resistance Training   Training Prescription Yes   Weight 5   Reps 10-12     Treadmill   MPH 2.8   Grade 0   Minutes 20   METs 3.1     T5 Nustep   Level 3   Watts 56   Minutes 15   METs 3.63     Home Exercise Plan   Plans to  continue exercise at Home   Frequency Add 2 additional days to program exercise sessions.     Goals Met:  Independence with exercise equipment Exercise tolerated well No report of cardiac concerns or symptoms Strength training completed today  Goals Unmet:  Not Applicable  Comments: Check out 915   Dr. Kate Sable is Medical Director for [place] and Pulmonary Rehab.

## 2016-03-19 ENCOUNTER — Encounter (HOSPITAL_COMMUNITY)
Admission: RE | Admit: 2016-03-19 | Discharge: 2016-03-19 | Disposition: A | Discharge status: Home / Self Care | Payer: BLUE CROSS/BLUE SHIELD | Source: Ambulatory Visit | Attending: Cardiology | Admitting: Cardiology

## 2016-03-19 DIAGNOSIS — Z955 Presence of coronary angioplasty implant and graft: Secondary | ICD-10-CM | POA: Diagnosis not present

## 2016-03-19 NOTE — Progress Notes (Signed)
Daily Session Note  Patient Details  Name: Jeffrey Hooper MRN: [id number] Date of Birth: 03/31/1961 Referring Provider:   Flowsheet Row CARDIAC REHAB PHASE II ORIENTATION from 01/03/2016 in [place]  Referring Provider  Dr. [name]      Encounter Date: 03/19/2016  Check In:     Session Check In - 03/19/16 0815      Check-In   Location [facility]   Staff Present Russella Dar, MS, EP, [facility], Exercise Physiologist;Taedyn Glasscock Luther Parody, BS, EP, Exercise Physiologist   Supervising physician immediately available to respond to emergencies See telemetry face sheet for immediately available MD   Medication changes reported     No   Fall or balance concerns reported    No   Warm-up and Cool-down Performed as group-led instruction   Resistance Training Performed Yes   VAD Patient? No     Pain Assessment   Currently in Pain? No/denies   Pain Score 0-No pain   Multiple Pain Sites No      Capillary Blood Glucose: No results found for this or any previous visit (from the past 24 hour(s)).   Goals Met:  Independence with exercise equipment Exercise tolerated well No report of cardiac concerns or symptoms Strength training completed today  Goals Unmet:  Not Applicable  Comments: Check out 915   Dr. Kate Sable is Medical Director for [place] and Pulmonary Rehab.

## 2016-03-21 ENCOUNTER — Encounter (HOSPITAL_COMMUNITY)
Admission: RE | Admit: 2016-03-21 | Discharge: 2016-03-21 | Disposition: A | Discharge status: Home / Self Care | Payer: BLUE CROSS/BLUE SHIELD | Source: Ambulatory Visit | Attending: Cardiology | Admitting: Cardiology

## 2016-03-21 DIAGNOSIS — Z955 Presence of coronary angioplasty implant and graft: Secondary | ICD-10-CM | POA: Diagnosis not present

## 2016-03-21 NOTE — Progress Notes (Signed)
Daily Session Note  Patient Details  Name: Jeffrey Hooper MRN: [id number] Date of Birth: [birth year] Referring Provider:   Flowsheet Row CARDIAC REHAB PHASE II ORIENTATION from 01/03/2016 in [place]  Referring Provider  Dr. [name]      Encounter Date: 03/21/2016  Check In:     Session Check In - 03/21/16 0906      Check-In   Location [facility]   Staff Present Russella Dar, MS, EP, [facility], Exercise Physiologist;Debra Wynetta Emery, RN, BSN   Supervising physician immediately available to respond to emergencies See telemetry face sheet for immediately available MD   Medication changes reported     No   Fall or balance concerns reported    No   Warm-up and Cool-down Performed as group-led instruction   Resistance Training Performed Yes   VAD Patient? No     Pain Assessment   Currently in Pain? No/denies   Pain Score 0-No pain   Multiple Pain Sites No      Capillary Blood Glucose: No results found for this or any previous visit (from the past 24 hour(s)).   Goals Met:  Independence with exercise equipment Exercise tolerated well No report of cardiac concerns or symptoms Strength training completed today  Goals Unmet:  Not Applicable  Comments: Check out: 0915   Dr. Kate Sable is Medical Director for [place] and Pulmonary Rehab.

## 2016-03-22 NOTE — Progress Notes (Signed)
Cardiac Individual Treatment Plan  Patient Details  Name: Jeffrey Hooper MRN: [id number] Date of Birth: [birth year] Referring Provider:   Flowsheet Row CARDIAC REHAB PHASE II ORIENTATION from 01/03/2016 in [place]  Referring Provider  Dr. [name]      Initial Encounter Date:  Flowsheet Row CARDIAC REHAB PHASE II ORIENTATION from 01/03/2016 in [place]  Date  01/03/16  Referring Provider  Dr. [name]      Visit Diagnosis: Stented coronary artery  Patient's Home Medications on Admission:  Current Outpatient Prescriptions:  .  acetaminophen (TYLENOL) 500 MG tablet, Take 1,000 mg by mouth every 6 (six) hours as needed (pain). , Disp: , Rfl:  .  amLODipine (NORVASC) 5 MG tablet, Take 1 tablet (5 mg total) by mouth daily., Disp: 90 tablet, Rfl: 3 .  aspirin EC 81 MG tablet, Take 81 mg by mouth at bedtime., Disp: , Rfl:  .  atorvastatin (LIPITOR) 80 MG tablet, Take 1 tablet (80 mg total) by mouth daily. (Patient taking differently: Take 80 mg by mouth daily at 6 PM. ), Disp: 90 tablet, Rfl: 3 .  clopidogrel (PLAVIX) 75 MG tablet, Take 1 tablet (75 mg total) by mouth daily., Disp: 30 tablet, Rfl: 11 .  diphenhydrAMINE (BENADRYL) 25 MG tablet, Take 50 mg by mouth every 6 (six) hours as needed for itching., Disp: , Rfl:  .  EPINEPHrine 0.3 mg/0.3 mL IJ SOAJ injection, Inject 0.3 mLs (0.3 mg total) into the muscle once., Disp: 2 Device, Rfl: 1 .  esomeprazole (NEXIUM) 40 MG capsule, Take 1 capsule (40 mg total) by mouth daily at 12 noon. (Patient taking differently: Take 40 mg by mouth daily with breakfast. ), Disp: 90 capsule, Rfl: 3 .  hydrochlorothiazide (HYDRODIURIL) 25 MG tablet, Take 1 tablet (25 mg total) by mouth daily., Disp: 90 tablet, Rfl: 1 .  levothyroxine (SYNTHROID, LEVOTHROID) 100 MCG tablet, Take 1 tablet (100 mcg total) by mouth daily., Disp: 90 tablet, Rfl: 1 .  Melatonin 10 MG TABS, Take 10 mg by mouth at bedtime. , Disp: ,  Rfl:  .  metoprolol tartrate (LOPRESSOR) 25 MG tablet, Take 0.5 tablets (12.5 mg total) by mouth 2 (two) times daily., Disp: 30 tablet, Rfl: 5 .  nitroGLYCERIN (NITROSTAT) 0.4 MG SL tablet, Place 1 tablet (0.4 mg total) under the tongue every 5 (five) minutes as needed for chest pain., Disp: 50 tablet, Rfl: 0 .  ondansetron (ZOFRAN) 4 MG tablet, Take 1 tablet (4 mg total) by mouth every 8 (eight) hours as needed for nausea or vomiting., Disp: 20 tablet, Rfl: 0  Past Medical History: Past Medical History:  Diagnosis Date  . Allergy   . Anginal pain ([place])   . Arthritis   . CAD (coronary artery disease), native coronary artery 12/03/2015  . Coronary artery disease   . Dysrhythmia    "irregular and PAC'S"  . GERD (gastroesophageal reflux disease)   . Headache   . Hyperlipidemia   . Hypertension   . Hypothyroid   . Multiple benign nevi   . Neuromuscular disorder ([place]) 2011   LEFT RADIAL NERVE SURGERY R/T TRAUMA  . Pneumonia 2014, 2017  . Prostatism   . Pulmonary nodule   . Seasonal allergies     Tobacco Use: History  Smoking Status  . Former Smoker  . Packs/day: 1.00  . Years: 20.00  . Types: Cigarettes  . Quit date: 11/28/2006  Smokeless Tobacco  . Never Used    Labs: Recent Review  Flowsheet Data    Labs for ITP Cardiac and Pulmonary Rehab Latest Ref Rng & Units 06/25/2012 10/29/2012 04/28/2013 08/07/2013 05/18/2015   Cholestrol 100 - 199 mg/dL - 136 122 127 -   LDLCALC 0 - 99 mg/dL - 74 50 65 -   HDL >39 mg/dL - 41 44 45 -   Trlycerides 0 - 149 mg/dL - 103 139 83 -   Hemoglobin A1c 4.8 - 5.6 % - - - - 5.8(H)   TCO2 0 - 100 mmol/L 27 - - - -      Capillary Blood Glucose: No results found for: GLUCAP   Exercise Target Goals:    Exercise Program Goal: Individual exercise prescription set with THRR, safety & activity barriers. Participant demonstrates ability to understand and report RPE using BORG scale, to self-measure pulse accurately, and to acknowledge the  importance of the exercise prescription.  Exercise Prescription Goal: Starting with aerobic activity 30 plus minutes a day, 3 days per week for initial exercise prescription. Provide home exercise prescription and guidelines that participant acknowledges understanding prior to discharge.  Activity Barriers & Risk Stratification:     Activity Barriers & Cardiac Risk Stratification - 01/03/16 1757      Activity Barriers & Cardiac Risk Stratification   Activity Barriers None   Cardiac Risk Stratification High      6 Minute Walk:     6 Minute Walk    Row Name 01/03/16 1802         6 Minute Walk   Phase Initial     Distance 1300 feet     Walk Time 6 minutes     # of Rest Breaks 0     MPH 2.46     METS 2.88     RPE 11     Perceived Dyspnea  12     VO2 Peak 12.2     Symptoms No     Resting HR 51 bpm     Resting BP 142/62     Max Ex. HR 82 bpm     Max Ex. BP 144/74     2 Minute Post BP 118/62        Initial Exercise Prescription:     Initial Exercise Prescription - 01/03/16 1800      Date of Initial Exercise RX and Referring Provider   Date 01/03/16   Referring Provider Dr. [name]     Treadmill   MPH 1.5   Grade 0   Minutes 15   METs 2.1     T5 Nustep   Level 2   Watts 25   Minutes 20   METs 1.9     Prescription Details   Frequency (times per week) 3   Duration Progress to 30 minutes of continuous aerobic without signs/symptoms of physical distress     Intensity   THRR REST +  30   THRR 40-80% of Max Heartrate [phone]   Ratings of Perceived Exertion 11-13   Perceived Dyspnea 0-4     Progression   Progression Continue to progress workloads to maintain intensity without signs/symptoms of physical distress.     Resistance Training   Training Prescription Yes   Weight 1   Reps 10-12      Perform Capillary Blood Glucose checks as needed.  Exercise Prescription Changes:      Exercise Prescription Changes    Row Name 01/11/16 1400  01/18/16 0800 01/23/16 1400 01/30/16 1700 02/09/16 0700     Exercise Review  Progression Yes Yes Yes  -  -     Response to Exercise   Blood Pressure (Admit) 112/60 112/60 98/64 100/60 120/64   Blood Pressure (Exercise) 130/60 130/60 150/60 154/76 130/62   Blood Pressure (Exit) 108/60 108/60 108/54 106/56 110/62   Heart Rate (Admit) 50 bpm 50 bpm 53 bpm 65 bpm 60 bpm   Heart Rate (Exercise) 83 bpm 83 bpm 85 bpm 95 bpm 81 bpm   Heart Rate (Exit) 72 bpm 72 bpm 61 bpm 62 bpm 68 bpm   Rating of Perceived Exertion (Exercise)  -  -  -  - 8   Duration  -  -  -  - Progress to 30 minutes of continuous aerobic without signs/symptoms of physical distress   Intensity  -  -  -  - Rest + 30     Progression   Progression  -  -  -  - Continue to progress workloads to maintain intensity without signs/symptoms of physical distress.     Resistance Training   Training Prescription Yes Yes Yes Yes Yes   Weight 1 1 1 2 2    Reps 10-12 10-12 10-12 10-12 10-12     Treadmill   MPH 1.9 2.1 2.2 2.2 2.2   Grade 0 0 0 0 0   Minutes 15 15 15 15 20    METs 2.2 2.3 2.3 2.6 2.7     T5 Nustep   Level 3 3 3 3 3    Watts 35 35 35 86 56   Minutes 20 20 20 20 20    METs 1.9 1.9 1.9 4.4 3.64     Home Exercise Plan   Plans to continue exercise at  -  -  - Home Home   Frequency  -  -  - Add 2 additional days to program exercise sessions. Add 2 additional days to program exercise sessions.   [place] Name 02/23/16 1000 02/29/16 1500 03/15/16 1200         Exercise Review   Progression No Yes Yes       Response to Exercise   Blood Pressure (Admit) 122/68 110/60 110/56     Blood Pressure (Exercise) 128/62 160/82 124/62     Blood Pressure (Exit) 112/42 134/66 108/60     Heart Rate (Admit) 51 bpm 52 bpm 48 bpm     Heart Rate (Exercise) 73 bpm 95 bpm 87 bpm     Heart Rate (Exit) 60 bpm 69 bpm 57 bpm     Rating of Perceived Exertion (Exercise) 10 10 9      Comments Patient did not progress much due to a reaction to his  medication. Wwe contacted his physician and had an appointment scheduled.   -  -     Duration Progress to 30 minutes of continuous aerobic without signs/symptoms of physical distress Progress to 30 minutes of continuous aerobic without signs/symptoms of physical distress Progress to 30 minutes of continuous aerobic without signs/symptoms of physical distress     Intensity Rest + 30 Rest + 30 Rest + 30       Progression   Progression Continue to progress workloads to maintain intensity without signs/symptoms of physical distress. Continue to progress workloads to maintain intensity without signs/symptoms of physical distress. Continue to progress workloads to maintain intensity without signs/symptoms of physical distress.       Resistance Training   Training Prescription Yes Yes Yes     Weight 2 2 5      Reps 10-12  10-12 10-12       Treadmill   MPH 2.2 2.6 2.8     Grade 0 0 0     Minutes 20 20 20      METs 2.6 2.9 3.1       T5 Nustep   Level 3 3 3      Watts 32 78 56     Minutes 15 15 15      METs 3.64 4.2 3.63       Home Exercise Plan   Plans to continue exercise at [facility]     Frequency Add 2 additional days to program exercise sessions. Add 2 additional days to program exercise sessions. Add 2 additional days to program exercise sessions.        Exercise Comments:      Exercise Comments    Row Name 02/29/16 1502 03/15/16 1226         Exercise Comments Patient is progressing appropriately  Patient is progressing appropriately.           Discharge Exercise Prescription (Final Exercise Prescription Changes):     Exercise Prescription Changes - 03/15/16 1200      Exercise Review   Progression Yes     Response to Exercise   Blood Pressure (Admit) 110/56   Blood Pressure (Exercise) 124/62   Blood Pressure (Exit) 108/60   Heart Rate (Admit) 48 bpm   Heart Rate (Exercise) 87 bpm   Heart Rate (Exit) 57 bpm   Rating of Perceived Exertion (Exercise) 9   Duration  Progress to 30 minutes of continuous aerobic without signs/symptoms of physical distress   Intensity Rest + 30     Progression   Progression Continue to progress workloads to maintain intensity without signs/symptoms of physical distress.     Resistance Training   Training Prescription Yes   Weight 5   Reps 10-12     Treadmill   MPH 2.8   Grade 0   Minutes 20   METs 3.1     T5 Nustep   Level 3   Watts 56   Minutes 15   METs 3.63     Home Exercise Plan   Plans to continue exercise at Home   Frequency Add 2 additional days to program exercise sessions.      Nutrition:  Target Goals: Understanding of nutrition guidelines, daily intake of sodium 1500mg , cholesterol 200mg , calories 30% from fat and 7% or less from saturated fats, daily to have 5 or more servings of fruits and vegetables.  Biometrics:     Pre Biometrics - 01/03/16 1807      Pre Biometrics   Height 5\' 9"  (1.753 m)   Weight 211 lb 3.2 oz (95.8 kg)   Waist Circumference 39.7 inches   Hip Circumference 42 inches   Waist to Hip Ratio 0.95 %   BMI (Calculated) 31.3   Triceps Skinfold 12 mm   % Body Fat 27.2 %   Grip Strength 117 kg   Flexibility 18 in   Single Leg Stand 30 seconds       Nutrition Therapy Plan and Nutrition Goals:   Nutrition Discharge: Rate Your Plate Scores:     Nutrition Assessments - 01/03/16 1807      MEDFICTS Scores   Pre Score 42      Nutrition Goals Re-Evaluation:   Psychosocial: Target Goals: Acknowledge presence or absence of depression, maximize coping skills, provide positive support system. Participant is able to verbalize types and ability to use  techniques and skills needed for reducing stress and depression.  Initial Review & Psychosocial Screening:     Initial Psych Review & Screening - 01/03/16 [place]? Yes     Barriers   Psychosocial barriers to participate in program There are no identifiable barriers or  psychosocial needs.     Screening Interventions   Interventions Encouraged to exercise      Quality of Life Scores:     Quality of Life - 01/03/16 1813      Quality of Life Scores   Health/Function Pre 24 %   Socioeconomic Pre 28.29 %   Psych/Spiritual Pre 28.29 %   Family Pre 25.2 %   GLOBAL Pre 25.94 %      PHQ-9: Recent Review Flowsheet Data    Depression screen [facility] 2/9 02/17/2016 01/03/2016 10/05/2015 08/17/2015 07/18/2015   Decreased Interest 0 0 0 0 0   Down, Depressed, Hopeless 0 0 0 0 0   PHQ - 2 Score 0 0 0 0 0   Altered sleeping - 1 - - -   Tired, decreased energy - 0 - - -   Change in appetite - 0 - - -   Feeling bad or failure about yourself  - 0 - - -   Trouble concentrating - 0 - - -   Moving slowly or fidgety/restless - 0 - - -   Suicidal thoughts - 0 - - -   PHQ-9 Score - 1 - - -   Difficult doing work/chores - Not difficult at all - - -      Psychosocial Evaluation and Intervention:   Psychosocial Re-Evaluation:     Psychosocial Re-Evaluation    Row Name 01/30/16 1756 03/01/16 0739 03/22/16 1409         Psychosocial Re-Evaluation   Interventions Encouraged to attend Cardiac Rehabilitation for the exercise  - Encouraged to attend Cardiac Rehabilitation for the exercise     Comments  - Patient does not have psysocial issues. QOL score 25.94 and PHQ9 score is 1. Does not need counseling.  Patient continues to have no psychosocial issues and does not need counseling.      Continued Psychosocial Services Needed No No No        Vocational Rehabilitation: Provide vocational rehab assistance to qualifying candidates.   Vocational Rehab Evaluation & Intervention:     Vocational Rehab - 01/03/16 1759      Initial Vocational Rehab Evaluation & Intervention   Assessment shows need for Vocational Rehabilitation No      Education: Education Goals: Education classes will be provided on a weekly basis, covering required topics. Participant will state  understanding/return demonstration of topics presented.  Learning Barriers/Preferences:     Learning Barriers/Preferences - 01/03/16 1759      Learning Barriers/Preferences   Learning Barriers None   Learning Preferences Skilled Demonstration  Reading      Education Topics: Hypertension, Hypertension Reduction -Define heart disease and high blood pressure. Discus how high blood pressure affects the body and ways to reduce high blood pressure.   Exercise and Your Heart -Discuss why it is important to exercise, the FITT principles of exercise, normal and abnormal responses to exercise, and how to exercise safely. Flowsheet Row CARDIAC REHAB PHASE II EXERCISE from 03/14/2016 in [place]  Date  03/14/16  Educator  DC  Instruction Review Code  2- meets goals/outcomes      Angina -Discuss  definition of angina, causes of angina, treatment of angina, and how to decrease risk of having angina.   Cardiac Medications -Review what the following cardiac medications are used for, how they affect the body, and side effects that may occur when taking the medications.  Medications include Aspirin, Beta blockers, calcium channel blockers, ACE Inhibitors, angiotensin receptor blockers, diuretics, digoxin, and antihyperlipidemics.   Congestive Heart Failure -Discuss the definition of CHF, how to live with CHF, the signs and symptoms of CHF, and how keep track of weight and sodium intake.   Heart Disease and Intimacy -Discus the effect sexual activity has on the heart, how changes occur during intimacy as we age, and safety during sexual activity. Flowsheet Row CARDIAC REHAB PHASE II EXERCISE from 03/14/2016 in [place]  Date  01/11/16  Educator  DC  Instruction Review Code  2- meets goals/outcomes      Smoking Cessation / COPD -Discuss different methods to quit smoking, the health benefits of quitting smoking, and the definition of  COPD. Flowsheet Row CARDIAC REHAB PHASE II EXERCISE from 03/14/2016 in [place]  Date  01/18/16  Educator  Russella Dar  Instruction Review Code  2- meets goals/outcomes      Nutrition I: Fats -Discuss the types of cholesterol, what cholesterol does to the heart, and how cholesterol levels can be controlled. Flowsheet Row CARDIAC REHAB PHASE II EXERCISE from 03/14/2016 in [place]  Date  01/25/16  Educator  Russella Dar  Instruction Review Code  2- meets goals/outcomes      Nutrition II: Labels -Discuss the different components of food labels and how to read food label [place] from 03/14/2016 in [place]  Date  02/01/16  Educator  Russella Dar  Instruction Review Code  2- meets goals/outcomes      Heart Parts and Heart Disease -Discuss the anatomy of the heart, the pathway of blood circulation through the heart, and these are affected by heart disease. Flowsheet Row CARDIAC REHAB PHASE II EXERCISE from 03/14/2016 in [place]  Date  02/08/16  Educator  DC  Instruction Review Code  2- meets goals/outcomes      Stress I: Signs and Symptoms -Discuss the causes of stress, how stress may lead to anxiety and depression, and ways to limit stress.   Stress II: Relaxation -Discuss different types of relaxation techniques to limit stress.   Warning Signs of Stroke / TIA -Discuss definition of a stroke, what the signs and symptoms are of a stroke, and how to identify when someone is having stroke. [place] PHASE II EXERCISE from 03/14/2016 in [place]  Date  02/29/16  Educator  DJ/DC  Instruction Review Code  2- meets goals/outcomes      Knowledge Questionnaire Score:     Knowledge Questionnaire Score - 01/03/16 1759      Knowledge Questionnaire Score   Pre Score 21/24      Core Components/Risk  Factors/Patient Goals at Admission:     Personal Goals and Risk Factors at Admission - 01/03/16 1810      Core Components/Risk Factors/Patient Goals on Admission    Weight Management Weight Loss   Sedentary --  No   Increase Strength and Stamina Yes   Intervention Provide advice, education, support and counseling about physical activity/exercise needs.   Expected Outcomes Achievement of increased cardiorespiratory fitness and enhanced flexibility, muscular endurance and strength shown  through measurements of functional capacity and personal statement of participant.   Tobacco Cessation --  No   Hypertension Yes   Intervention Provide education on lifestyle modifcations including regular physical activity/exercise, weight management, moderate sodium restriction and increased consumption of fresh fruit, vegetables, and low fat dairy, alcohol moderation, and smoking cessation.   Expected Outcomes Short Term: Continued assessment and intervention until BP is < 140/59mm HG in hypertensive participants. < 130/60mm HG in hypertensive participants with diabetes, heart failure or chronic kidney disease.;Long Term: Maintenance of blood pressure at goal levels.   Personal Goal Other Yes   Personal Goal No more episodes of SOB, Make sure heart is back to normal.    Intervention come to class 3xwee and to suppliment with 2 days outside of CR class.    Expected Outcomes To feel better, less SOB, live heart healthy lifestyle.       Core Components/Risk Factors/Patient Goals Review:      Goals and Risk Factor Review    Row Name 01/30/16 1754 03/01/16 0735 03/22/16 1407         Core Components/Risk Factors/Patient Goals Review   Personal Goals Review Improve shortness of breath with ADL's;Increase Strength and Stamina Improve shortness of breath with ADL's;Increase Strength and Stamina Increase Strength and Stamina;Improve shortness of breath with ADL's     Review Improve SOB with exertion, get back  to normal with ADL's and heart health in general.  Patient is doing well in the program. Patient is having less SOB and is getting stronger. After 27 sessions, patient is doing well in the program. Patient continues to have less SOB and is getting stronger. He has lost 6.2 lbs since starting program.      Expected Outcomes Little to no SOB with ADL's Continue to increase stamina and strength with improved SOB.  Patient will continue to meet the above stated goals.         Core Components/Risk Factors/Patient Goals at Discharge (Final Review):      Goals and Risk Factor Review - 03/22/16 1407      Core Components/Risk Factors/Patient Goals Review   Personal Goals Review Increase Strength and Stamina;Improve shortness of breath with ADL's   Review After 27 sessions, patient is doing well in the program. Patient continues to have less SOB and is getting stronger. He has lost 6.2 lbs since starting program.    Expected Outcomes Patient will continue to meet the above stated goals.       ITP Comments:   Comments: 30 day review: Patient doing well with program. Will continue to monitor for progress.

## 2016-03-23 ENCOUNTER — Encounter (HOSPITAL_COMMUNITY)
Admission: RE | Admit: 2016-03-23 | Discharge: 2016-03-23 | Disposition: A | Discharge status: Home / Self Care | Payer: BLUE CROSS/BLUE SHIELD | Source: Ambulatory Visit | Attending: Cardiology | Admitting: Cardiology

## 2016-03-23 DIAGNOSIS — Z955 Presence of coronary angioplasty implant and graft: Secondary | ICD-10-CM

## 2016-03-23 NOTE — Progress Notes (Signed)
Daily Session Note  Patient Details  Name: CAEDAN SUMLER MRN: [id number] Date of Birth: [birth year] Referring Provider:   Flowsheet Row CARDIAC REHAB PHASE II ORIENTATION from 01/03/2016 in [place]  Referring Provider  Dr. [name]      Encounter Date: 03/23/2016  Check In:     Session Check In - 03/23/16 0815      Check-In   Location [facility]   Staff Present Russella Dar, MS, EP, [facility], Exercise Physiologist;Gregory Luther Parody, BS, EP, Exercise Physiologist   Supervising physician immediately available to respond to emergencies See telemetry face sheet for immediately available MD   Medication changes reported     No   Fall or balance concerns reported    No   Warm-up and Cool-down Performed as group-led instruction   Resistance Training Performed Yes   VAD Patient? No     Pain Assessment   Currently in Pain? No/denies   Pain Score 0-No pain   Multiple Pain Sites No      Capillary Blood Glucose: No results found for this or any previous visit (from the past 24 hour(s)).   Goals Met:  Independence with exercise equipment Exercise tolerated well No report of cardiac concerns or symptoms Strength training completed today  Goals Unmet:  Not Applicable  Comments: Check out: 0915   Dr. Kate Sable is Medical Director for [place] and Pulmonary Rehab.

## 2016-03-26 ENCOUNTER — Encounter (HOSPITAL_COMMUNITY)
Admission: RE | Admit: 2016-03-26 | Discharge: 2016-03-26 | Disposition: A | Discharge status: Home / Self Care | Payer: BLUE CROSS/BLUE SHIELD | Source: Ambulatory Visit | Attending: Cardiology | Admitting: Cardiology

## 2016-03-26 DIAGNOSIS — Z955 Presence of coronary angioplasty implant and graft: Secondary | ICD-10-CM

## 2016-03-26 NOTE — Progress Notes (Signed)
Daily Session Note  Patient Details  Name: Jeffrey Hooper MRN: [id number] Date of Birth: 01-07-1961 Referring Provider:   Flowsheet Row CARDIAC REHAB PHASE II ORIENTATION from 01/03/2016 in [place]  Referring Provider  Dr. [name]      Encounter Date: 03/26/2016  Check In:     Session Check In - 03/26/16 0930      Check-In   Location [facility]   Staff Present Diane Angelina Pih, MS, EP, [facility], Exercise Physiologist;Debra Wynetta Emery, RN, BSN;Everitt Wenner, BS, EP, Exercise Physiologist   Supervising physician immediately available to respond to emergencies See telemetry face sheet for immediately available MD   Medication changes reported     No   Fall or balance concerns reported    No   Warm-up and Cool-down Performed as group-led instruction   Resistance Training Performed Yes   VAD Patient? No     Pain Assessment   Currently in Pain? No/denies   Pain Score 0-No pain   Multiple Pain Sites No      Capillary Blood Glucose: No results found for this or any previous visit (from the past 24 hour(s)).   Goals Met:  Independence with exercise equipment Exercise tolerated well No report of cardiac concerns or symptoms Strength training completed today  Goals Unmet:  Not Applicable  Comments: Check out 1030   Dr. Kate Sable is Medical Director for [place] and Pulmonary Rehab.

## 2016-03-26 NOTE — Progress Notes (Signed)
Cardiology Office Note   Date:  03/28/2016   ID:  Jeffrey Hooper, DOB [birth year], MRN [id number]  PCP:  Jeffrey Maize, MD  Cardiologist:   Jeffrey Breeding, MD   Chief Complaint  Patient presents with  . Coronary Artery Disease      History of Present Illness: Jeffrey Hooper is a 55 y.o. male who presents initially for evaluation of palpitations, chest pain and abnormal stress test. He was in his primary care office recently and EKG was obtained that suggested atrial fibrillation. However, on that day I reviewed the EKG and it was premature atrial contractions against normal sinus rhythm. There was no fibrillation. He did have a Holter monitor placed.  A POET (Plain Old Exercise Treadmill) was read as abnormal. He had an echo echocardiogram which was normal. Stress perfusion study which suggested a moderate sized defect in the inferior wall with ischemia. The EF was well-preserved.  The patient presented to [facility] on 12/02/15 for cath and was found to have single vessel obstructive CAD involving the mid LAD. He underwent successful PCI with a mid LAD DES.  He was placed on DAPT with ASA and Brilinta, high intensity statin, metoprolol and amlodipine.  Beta blocker was stopped in hospital because of bradycardia.   He had to come back to the hospital with tachycardia however thought to be related to the beta blocker withdrawal.  His beta blocker was restarted.  Also the patient seemed to have hives with Brilinta.  He was switched to Plavix.   Since I last saw him he has done well.  He is doing cardiac rehab.  The patient denies any new symptoms such as chest discomfort, neck or arm discomfort. There has been no new shortness of breath, PND or orthopnea. There have been no reported presyncope or syncope.  He has had some scattered palpitations. He's had a little muscular neck discomfort. However, overall he's been feeling okay and able to do work and do his rehabilitation.   Past Medical History:    Diagnosis Date  . Allergy   . Anginal pain ([place])   . Arthritis   . CAD (coronary artery disease), native coronary artery 12/03/2015  . Coronary artery disease   . Dysrhythmia    "irregular and PAC'S"  . GERD (gastroesophageal reflux disease)   . Headache   . Hyperlipidemia   . Hypertension   . Hypothyroid   . Multiple benign nevi   . Neuromuscular disorder ([place]) 2011   LEFT RADIAL NERVE SURGERY R/T TRAUMA  . Pneumonia 2014, 2017  . Prostatism   . Pulmonary nodule   . Seasonal allergies     Past Surgical History:  Procedure Laterality Date  . BACK SURGERY     T12 - L1  . CARDIAC CATHETERIZATION N/A 12/02/2015   Procedure: Left Heart Cath and Coronary Angiography;  Surgeon: Jeffrey M Martinique, MD;  Location: [place] [facility];  Service: Cardiovascular;  Laterality: N/A;  . CARDIAC CATHETERIZATION N/A 12/02/2015   Procedure: Coronary Stent Intervention;  Surgeon: Jeffrey M Martinique, MD;  Location: [place] [facility];  Service: Cardiovascular;  Laterality: N/A;  mid LAD Promus 2.5x12  . CORONARY ANGIOPLASTY    . MASS EXCISION  06/25/2012   Procedure: EXCISION MASS;  Surgeon: Jeffrey Sours, MD;  Location: [place];  Service: Orthopedics;  Laterality: Left;  transection of NEUROMA, BURYING RADIAL NERVE IN BRACHIORADIALIS LEFT SIDE  . radial nerve    . sinus signs    .  SPINAL FUSION     C6-7  . TONSILLECTOMY    . TONSILLECTOMY  1982  . VASECTOMY       Current Outpatient Prescriptions  Medication Sig Dispense Refill  . acetaminophen (TYLENOL) 500 MG tablet Take 1,000 mg by mouth every 6 (six) hours as needed (pain).     [name] amLODipine (NORVASC) 5 MG tablet Take 1 tablet (5 mg total) by mouth daily. 90 tablet 3  . aspirin EC 81 MG tablet Take 81 mg by mouth at bedtime.    [name] atorvastatin (LIPITOR) 80 MG tablet Take 1 tablet (80 mg total) by mouth daily. 90 tablet 3  . clopidogrel (PLAVIX) 75 MG tablet Take 1 tablet (75 mg total) by mouth daily. 30 tablet 11  .  diphenhydrAMINE (BENADRYL) 25 MG tablet Take 50 mg by mouth every 6 (six) hours as needed for itching.    [name] EPINEPHrine 0.3 mg/0.3 mL IJ SOAJ injection Inject 0.3 mLs (0.3 mg total) into the muscle once. 2 Device 1  . esomeprazole (NEXIUM) 40 MG capsule Take 1 capsule (40 mg total) by mouth daily at 12 noon. (Patient taking differently: Take 40 mg by mouth daily. ) 90 capsule 3  . hydrochlorothiazide (HYDRODIURIL) 25 MG tablet Take 1 tablet (25 mg total) by mouth daily. 90 tablet 1  . levothyroxine (SYNTHROID, LEVOTHROID) 100 MCG tablet Take 1 tablet (100 mcg total) by mouth daily. 90 tablet 1  . Melatonin 10 MG TABS Take 10 mg by mouth at bedtime.     . metoprolol tartrate (LOPRESSOR) 25 MG tablet Take 0.5 tablets (12.5 mg total) by mouth 2 (two) times daily. 30 tablet 5  . nitroGLYCERIN (NITROSTAT) 0.4 MG SL tablet Place 1 tablet (0.4 mg total) under the tongue every 5 (five) minutes as needed for chest pain. 50 tablet 0  . ondansetron (ZOFRAN) 4 MG tablet Take 1 tablet (4 mg total) by mouth every 8 (eight) hours as needed for nausea or vomiting. 20 tablet 0   No current facility-administered medications for this visit.     Allergies:   Prednisone; Brilinta [ticagrelor]; Doxycycline hyclate; Penicillins; Beef-derived products; Fentanyl; Lambs quarters; and Pork-derived products    ROS:  Please see the history of present illness.   Otherwise, review of systems are positive for none.   All other systems are reviewed and negative.    PHYSICAL EXAM: VS:  BP 139/82   Pulse (!) 58   Ht 5\' 10"  (1.778 m)   Wt 206 lb (93.4 kg)   BMI 29.56 kg/m  , BMI Body mass index is 29.56 kg/m. GENERAL:  Well appearing HEENT:  Pupils equal round and reactive, fundi not visualized, oral mucosa unremarkable NECK:  No jugular venous distention, waveform within normal limits, carotid upstroke brisk and symmetric, no bruits, no thyromegaly LYMPHATICS:  No cervical, inguinal adenopathy LUNGS:  Clear to  auscultation bilaterally BACK:  No CVA tenderness CHEST:  Unremarkable HEART:  PMI not displaced or sustained,S1 and S2 within normal limits, no S3, no S4, no clicks, no rubs, no murmurs ABD:  Flat, positive bowel sounds normal in frequency in pitch, no bruits, no rebound, no guarding, no midline pulsatile mass, no hepatomegaly, no splenomegaly EXT:  2 plus pulses throughout, no edema, no cyanosis no clubbing SKIN:  No rashes no nodules NEURO:  Cranial nerves II through XII grossly intact, motor grossly intact throughout PSYCH:  Cognitively intact, oriented to person place and time    EKG:  EKG  is ordered today. Sinus  rhythm, rate 58, axis within normal limits, intervals within normal limits, no acute ST-T wave changes.  Recent Labs: 11/09/2015: Magnesium 2.3; TSH 2.120 12/05/2015: Hemoglobin 16.3; Platelets 207 02/17/2016: ALT 34; BUN 14; Creatinine, Ser 1.08; Potassium 3.4; Sodium 139    Lipid Panel    Component Value Date/Time   CHOL 127 08/07/2013 1049   CHOL 136 10/29/2012 0922   TRIG 83 08/07/2013 1049   TRIG 139 04/28/2013 1505   TRIG 103 10/29/2012 0922   HDL 45 08/07/2013 1049   HDL 44 04/28/2013 1505   HDL 41 10/29/2012 0922   CHOLHDL 2.8 08/07/2013 1049   LDLCALC 65 08/07/2013 1049   LDLCALC 50 04/28/2013 1505   LDLCALC 74 10/29/2012 0922      Wt Readings from Last 3 Encounters:  03/28/16 206 lb (93.4 kg)  02/17/16 208 lb (94.3 kg)  01/03/16 211 lb 3.2 oz (95.8 kg)      Other studies Reviewed: Additional studies/ records that were reviewed today include: None Review of the above records demonstrates:    ASSESSMENT AND PLAN:  CAD S/P percutaneous coronary angioplasty He is doing well from this standpoint. We will continue with risk reduction.  Drug reaction I'm listing Brilinta as an allergy although there is some question as to whether this could be related to his newly diagnosed [place] illness.  He will continue with Plavix.  Essential  hypertension The blood pressure is at target. No change in medications is indicated. We will continue with therapeutic lifestyle changes (TLC).  Palpitations I don't want to increase his beta blocker significantly as he had bradycardia but we did talk about when necessary dosing of his immediate release low-dose as needed.  Dyslipidemia On high dose statin Rx. I will check a lipid profile fasting.  Renal insufficiency His creatinine last checked was 1.08. No change in therapy.  Anxiety This does contribute somewhat to his symptom. This can continue to be managed by Jeffrey Maize, MD     Current medicines are reviewed at length with the patient today.  The patient does not have concerns regarding medicines.  The following changes have been made:  no change  Labs/ tests ordered today include:   Orders Placed This Encounter  Procedures  . EKG 12-Lead     Disposition:   FU with me in six months.    Signed, Jeffrey Breeding, MD  03/28/2016 9:50 AM    [place]

## 2016-03-28 ENCOUNTER — Ambulatory Visit (INDEPENDENT_AMBULATORY_CARE_PROVIDER_SITE_OTHER): Payer: BLUE CROSS/BLUE SHIELD | Admitting: Cardiology

## 2016-03-28 ENCOUNTER — Encounter: Payer: Self-pay | Admitting: Cardiology

## 2016-03-28 ENCOUNTER — Encounter (HOSPITAL_COMMUNITY): Payer: BLUE CROSS/BLUE SHIELD

## 2016-03-28 VITALS — BP 139/82 | HR 58 | Ht 70.0 in | Wt 206.0 lb

## 2016-03-28 DIAGNOSIS — Z79899 Other long term (current) drug therapy: Secondary | ICD-10-CM

## 2016-03-28 DIAGNOSIS — E785 Hyperlipidemia, unspecified: Secondary | ICD-10-CM

## 2016-03-28 DIAGNOSIS — I2510 Atherosclerotic heart disease of native coronary artery without angina pectoris: Secondary | ICD-10-CM

## 2016-03-28 DIAGNOSIS — I10 Essential (primary) hypertension: Secondary | ICD-10-CM | POA: Diagnosis not present

## 2016-03-28 DIAGNOSIS — Z9861 Coronary angioplasty status: Secondary | ICD-10-CM

## 2016-03-28 NOTE — Patient Instructions (Signed)
Medication Instructions:  The current medical regimen is effective;  continue present plan and medications.  Labwork: Please have fasting blood work at [facility]. (Lipid and liver) Follow-Up: Follow up in 6 months with Dr. [name].  You will receive a letter in the mail 2 months before you are due.  Please call us when you receive this letter to schedule your follow up appointment.  If you need a refill on your cardiac medications before your next appointment, please call your pharmacy.  Thank you for choosing [facility]!!

## 2016-03-30 ENCOUNTER — Encounter (HOSPITAL_COMMUNITY)
Admission: RE | Admit: 2016-03-30 | Discharge: 2016-03-30 | Disposition: A | Discharge status: Home / Self Care | Payer: BLUE CROSS/BLUE SHIELD | Source: Ambulatory Visit | Attending: Cardiology | Admitting: Cardiology

## 2016-03-30 DIAGNOSIS — Z955 Presence of coronary angioplasty implant and graft: Secondary | ICD-10-CM | POA: Diagnosis not present

## 2016-04-02 ENCOUNTER — Encounter (HOSPITAL_COMMUNITY)
Admission: RE | Admit: 2016-04-02 | Discharge: 2016-04-02 | Disposition: A | Discharge status: Home / Self Care | Payer: BLUE CROSS/BLUE SHIELD | Source: Ambulatory Visit | Attending: Cardiology | Admitting: Cardiology

## 2016-04-02 DIAGNOSIS — Z955 Presence of coronary angioplasty implant and graft: Secondary | ICD-10-CM

## 2016-04-02 NOTE — Progress Notes (Signed)
Daily Session Note  Patient Details  Name: BRAXDON GAPPA MRN: [id number] Date of Birth: 02/14/1961 Referring Provider:   Flowsheet Row CARDIAC REHAB PHASE II ORIENTATION from 01/03/2016 in [place]  Referring Provider  Dr. [name]      Encounter Date: 04/02/2016  Check In:     Session Check In - 04/02/16 0815      Check-In   Location [facility]   Staff Present Russella Dar, MS, EP, [facility], Exercise Physiologist;Debra Wynetta Emery, RN, BSN;Shandra Szymborski, BS, EP, Exercise Physiologist   Supervising physician immediately available to respond to emergencies See telemetry face sheet for immediately available MD   Medication changes reported     No   Fall or balance concerns reported    No   Warm-up and Cool-down Performed as group-led instruction   Resistance Training Performed Yes   VAD Patient? No     Pain Assessment   Currently in Pain? No/denies   Pain Score 0-No pain   Multiple Pain Sites No      Capillary Blood Glucose: No results found for this or any previous visit (from the past 24 hour(s)).   Goals Met:  Independence with exercise equipment Exercise tolerated well No report of cardiac concerns or symptoms Strength training completed today  Goals Unmet:  Not Applicable  Comments: Check out 915   Dr. Kate Sable is Medical Director for [place] [facility] and Pulmonary Rehab.

## 2016-04-03 ENCOUNTER — Other Ambulatory Visit: Payer: Self-pay | Admitting: Allergy and Immunology

## 2016-04-03 ENCOUNTER — Ambulatory Visit (INDEPENDENT_AMBULATORY_CARE_PROVIDER_SITE_OTHER): Payer: BLUE CROSS/BLUE SHIELD | Admitting: Allergy and Immunology

## 2016-04-03 ENCOUNTER — Encounter: Payer: Self-pay | Admitting: Allergy and Immunology

## 2016-04-03 VITALS — BP 122/74 | HR 60 | Temp 97.9°F | Resp 16 | Ht 68.5 in | Wt 202.0 lb

## 2016-04-03 DIAGNOSIS — L500 Allergic urticaria: Secondary | ICD-10-CM

## 2016-04-03 DIAGNOSIS — T7800XD Anaphylactic reaction due to unspecified food, subsequent encounter: Secondary | ICD-10-CM | POA: Diagnosis not present

## 2016-04-03 DIAGNOSIS — T7800XA Anaphylactic reaction due to unspecified food, initial encounter: Secondary | ICD-10-CM | POA: Insufficient documentation

## 2016-04-03 DIAGNOSIS — T7840XA Allergy, unspecified, initial encounter: Secondary | ICD-10-CM

## 2016-04-03 DIAGNOSIS — J3089 Other allergic rhinitis: Secondary | ICD-10-CM | POA: Diagnosis not present

## 2016-04-03 LAB — COMPREHENSIVE METABOLIC PANEL
ALT: 26 U/L (ref 9–46)
AST: 21 U/L (ref 10–35)
Albumin: 4.2 g/dL (ref 3.6–5.1)
Alkaline Phosphatase: 81 U/L (ref 40–115)
BUN: 15 mg/dL (ref 7–25)
CO2: 29 mmol/L (ref 20–31)
Calcium: 9.1 mg/dL (ref 8.6–10.3)
Chloride: 101 mmol/L (ref 98–110)
Creat: 1.08 mg/dL (ref 0.70–1.33)
Glucose, Bld: 97 mg/dL (ref 65–99)
Potassium: 3.6 mmol/L (ref 3.5–5.3)
Sodium: 142 mmol/L (ref 135–146)
Total Bilirubin: 0.8 mg/dL (ref 0.2–1.2)
Total Protein: 6.4 g/dL (ref 6.1–8.1)

## 2016-04-03 LAB — CBC WITH DIFFERENTIAL/PLATELET
Basophils Absolute: 66 cells/uL (ref 0–200)
Basophils Relative: 1 %
Eosinophils Absolute: 132 cells/uL (ref 15–500)
Eosinophils Relative: 2 %
HCT: 43.4 % (ref 38.5–50.0)
Hemoglobin: 15.3 g/dL (ref 13.2–17.1)
Lymphocytes Relative: 24 %
Lymphs Abs: 1584 cells/uL (ref 850–3900)
MCH: 30.8 pg (ref 27.0–33.0)
MCHC: 35.3 g/dL (ref 32.0–36.0)
MCV: 87.3 fL (ref 80.0–100.0)
MPV: 11 fL (ref 7.5–12.5)
Monocytes Absolute: 726 cells/uL (ref 200–950)
Monocytes Relative: 11 %
Neutro Abs: 4092 cells/uL (ref 1500–7800)
Neutrophils Relative %: 62 %
Platelets: 214 10*3/uL (ref 140–400)
RBC: 4.97 MIL/uL (ref 4.20–5.80)
RDW: 13.1 % (ref 11.0–15.0)
WBC: 6.6 10*3/uL (ref 3.8–10.8)

## 2016-04-03 MED ORDER — LEVOCETIRIZINE DIHYDROCHLORIDE 5 MG PO TABS: 2.5000 mg | ORAL_TABLET | Freq: Every evening | ORAL | 5 refills | Status: DC

## 2016-04-03 NOTE — Assessment & Plan Note (Addendum)
Galactose-alpha-1,3-galactose (alpha-gal) hypersensitivity.  Continue meticulous avoidance of nonprimate mammalian meat and have access to epinephrine autoinjector 2 pack in case of accidental ingestion.  Emergency allergy action plan is in place.

## 2016-04-03 NOTE — Assessment & Plan Note (Signed)
   Aeroallergen avoidance measures have been discussed and provided in written form.  A prescription has been provided for levocetirizine (as above).  Continue fluticasone nasal spray 2 sprays per nostril daily as needed.  I have also recommended nasal saline spray (i.e., Simply Saline) or nasal saline lavage (i.e., NeilMed) as needed prior to medicated nasal sprays.

## 2016-04-03 NOTE — Assessment & Plan Note (Addendum)
Jeffrey Hooper's history suggests allergic reaction with an unclear trigger.  His initial reactions were related to alpha gal hypersensitivity, however the more recent reactions seemed to be unrelated to the consumption of mammalian meat.  There is always the potential for accidental cross contamination. Food allergen skin tests were negative today with the exception of a borderline positive reaction to shrimp allergen.  He does not believe that he consumed shrimp or other shellfish prior to the more recent reaction though, again, cross-contamination is always a possibility.  We will proceed with in vitro lab studies to clarify the etiology.  The following labs have been ordered: FCeRI antibody, TSH, anti-thyroglobulin antibody, thyroid peroxidase antibody, baseline serum tryptase, CBC, CMP, ESR, ANA, and serum specific IgE against shellfish panel. An additional lab order for serum tryptase has been provided which is to be kept by the patient to be drawn in the emergency department within 4 hours of symptom onset should symptoms recur.  Until allergy has been definitively ruled out, he will meticulously avoid shellfish access to epinephrine auto-injectors.  Should symptoms recur, the patient has been asked to keep a journal to record any foods eaten, beverages consumed, medications taken within a 6 hour period prior to the onset of symptoms, as well as record activities being performed, and environmental conditions. For any symptoms concerning for anaphylaxis, epinephrine is to be administered and 911 is to be called immediately.  Continue to have access to epinephrine autoinjector 2 pack.  For now, he will take levocetirizine 2.5 mg daily.  A prescription has been provided. 

## 2016-04-03 NOTE — Patient Instructions (Addendum)
Allergic reaction Jeffrey Hooper's history suggests allergic reaction with an unclear trigger.  His initial reactions were related to alpha gal hypersensitivity, however the more recent reactions seemed to be unrelated to the consumption of mammalian meat.  There is always the potential for accidental cross contamination. Food allergen skin tests were negative today with the exception of a borderline positive reaction to shrimp allergen.  He does not believe that he consumed shrimp or other shellfish prior to the more recent reaction though, again, cross-contamination is always a possibility.  We will proceed with in vitro lab studies to clarify the etiology.  The following labs have been ordered: FCeRI antibody, TSH, anti-thyroglobulin antibody, thyroid peroxidase antibody, baseline serum tryptase, CBC, CMP, ESR, ANA, and serum specific IgE against shellfish panel. An additional lab order for serum tryptase has been provided which is to be kept by the patient to be drawn in the emergency department within 4 hours of symptom onset should symptoms recur.  Until allergy has been definitively ruled out, he will meticulously avoid shellfish access to epinephrine auto-injectors.  Should symptoms recur, the patient has been asked to keep a journal to record any foods eaten, beverages consumed, medications taken within a 6 hour period prior to the onset of symptoms, as well as record activities being performed, and environmental conditions. For any symptoms concerning for anaphylaxis, epinephrine is to be administered and 911 is to be called immediately.  Continue to have access to epinephrine autoinjector 2 pack.  For now, he will take levocetirizine 2.5 mg daily.  A prescription has been provided.  Food allergy Galactose-alpha-1,3-galactose (alpha-gal) hypersensitivity.  Continue meticulous avoidance of nonprimate mammalian meat and have access to epinephrine autoinjector 2 pack in case of accidental  ingestion.  Emergency allergy action plan is in place.  Perennial allergic rhinitis  Aeroallergen avoidance measures have been discussed and provided in written form.  A prescription has been provided for levocetirizine (as above).  Continue fluticasone nasal spray 2 sprays per nostril daily as needed.  I have also recommended nasal saline spray (i.e., Simply Saline) or nasal saline lavage (i.e., NeilMed) as needed prior to medicated nasal sprays.   When lab results have returned the patient will be called with further recommendations and follow up instructions.   Control of House Dust Mite Allergen  House dust mites play a major role in allergic asthma and rhinitis.  They occur in environments with high humidity wherever human skin, the food for dust mites is found. High levels have been detected in dust obtained from mattresses, pillows, carpets, upholstered furniture, bed covers, clothes and soft toys.  The principal allergen of the house dust mite is found in its feces.  A gram of dust may contain 1,000 mites and 250,000 fecal particles.  Mite antigen is easily measured in the air during house cleaning activities.    1. Encase mattresses, including the box spring, and pillow, in an air tight cover.  Seal the zipper end of the encased mattresses with wide adhesive tape. 2. Wash the bedding in water of 130 degrees Farenheit weekly.  Avoid cotton comforters/quilts and flannel bedding: the most ideal bed covering is the dacron comforter. 3. Remove all upholstered furniture from the bedroom. 4. Remove carpets, carpet padding, rugs, and non-washable window drapes from the bedroom.  Wash drapes weekly or use plastic window coverings. 5. Remove all non-washable stuffed toys from the bedroom.  Wash stuffed toys weekly. 6. Have the room cleaned frequently with a vacuum cleaner and a damp dust-mop.  The patient should not be in a room which is being cleaned and should wait 1 hour after cleaning  before going into the room. 7. Close and seal all heating outlets in the bedroom.  Otherwise, the room will become filled with dust-laden air.  An electric heater can be used to heat the room. 8. Reduce indoor humidity to less than 50%.  Do not use a humidifier.  Control of Dog or Cat Allergen  Avoidance is the best way to manage a dog or cat allergy. If you have a dog or cat and are allergic to dog or cats, consider removing the dog or cat from the home. If you have a dog or cat but don't want to find it a new home, or if your family wants a pet even though someone in the household is allergic, here are some strategies that may help keep symptoms at bay:  1. Keep the pet out of your bedroom and restrict it to only a few rooms. Be advised that keeping the dog or cat in only one room will not limit the allergens to that room. 2. Don't pet, hug or kiss the dog or cat; if you do, wash your hands with soap and water. 3. High-efficiency particulate air (HEPA) cleaners run continuously in a bedroom or living room can reduce allergen levels over time. 4. Regular use of a high-efficiency vacuum cleaner or a central vacuum can reduce allergen levels. 5. Giving your dog or cat a bath at least once a week can reduce airborne allergen.  Control of Cockroach Allergen  Cockroach allergen has been identified as an important cause of acute attacks of asthma, especially in urban settings.  There are fifty-five species of cockroach that exist in the [place], however only three, the [place], [place] species produce allergen that can affect patients with Asthma.  Allergens can be obtained from fecal particles, egg casings and secretions from cockroaches.    1. Remove food sources. 2. Reduce access to water. 3. Seal access and entry points. 4. Spray runways with 0.5-1% Diazinon or Chlorpyrifos 5. Blow boric acid power under stoves and refrigerator. 6. Place bait stations (hydramethylnon) at  feeding sites.

## 2016-04-03 NOTE — Assessment & Plan Note (Deleted)
   Aeroallergen avoidance measures have been discussed and provided in written form.  A prescription has been provided for levocetirizine (as above).  Continue fluticasone nasal spray 2 sprays per nostril daily as needed.  I have also recommended nasal saline spray (i.e., Simply Saline) or nasal saline lavage (i.e., NeilMed) as needed prior to medicated nasal sprays.

## 2016-04-03 NOTE — Progress Notes (Signed)
New Patient Note  RE: Jeffrey Hooper MRN: [id number] DOB: 03/17/1961 Date of Office Visit: 04/03/2016  Referring provider: [name], MD Primary care provider: [name], MD  Chief Complaint: Allergic Reaction   History of present illness: Jeffrey Hooper is a 55 y.o. male presenting today for consultation of allergic reactions.  He had a cardiac stent placed on 12/02/2015.  On the evening of April 29 he consumed a pulled pork sandwich before going to bed and woke up in the middle the night with elevated heart rate, mild facial swelling, and hives on his arms, chest, upper back, neck, face, and head.  He was evaluated and treated in the emergency department and released with a prescription for epinephrine autoinjector 2 pack.  In addition, Brilinta was discontinued and Plavix was started.  He continued to experience occasional hives and mild facial swelling and blood work revealed elevated galactose-alpha-1,3-galactose IgE level.  He began to avoid nonprimate mammalian meat and was symptom-free until last Thursday when he developed symptoms including elevated heart rate, nausea, hives and mild facial swelling.  He did not consume mammalian meat that he is aware of, however he admits that there may have been cross-contamination. Jailen experiences nasal congestion, rhinorrhea, and sneezing.   No significant seasonal symptom variation has been noted nor have specific environmental triggers been identified.  He takes diphenhydramine and/or fluticasone nasal spray as needed in an attempt to control these symptoms.   Assessment and plan: Allergic reaction John's history suggests allergic reaction with an unclear trigger.  His initial reactions were related to alpha gal hypersensitivity, however the more recent reactions seemed to be unrelated to the consumption of mammalian meat.  There is always the potential for accidental cross contamination. Food allergen skin tests were negative  today with the exception of a borderline positive reaction to shrimp allergen.  He does not believe that he consumed shrimp or other shellfish prior to the more recent reaction though, again, cross-contamination is always a possibility.  We will proceed with in vitro lab studies to clarify the etiology.  The following labs have been ordered: FCeRI antibody, TSH, anti-thyroglobulin antibody, thyroid peroxidase antibody, baseline serum tryptase, CBC, CMP, ESR, ANA, and serum specific IgE against shellfish panel. An additional lab order for serum tryptase has been provided which is to be kept by the patient to be drawn in the emergency department within 4 hours of symptom onset should symptoms recur.  Until allergy has been definitively ruled out, he will meticulously avoid shellfish access to epinephrine auto-injectors.  Should symptoms recur, the patient has been asked to keep a journal to record any foods eaten, beverages consumed, medications taken within a 6 hour period prior to the onset of symptoms, as well as record activities being performed, and environmental conditions. For any symptoms concerning for anaphylaxis, epinephrine is to be administered and 911 is to be called immediately.  Continue to have access to epinephrine autoinjector 2 pack.  For now, he will take levocetirizine 2.5 mg daily.  A prescription has been provided.  Food allergy Galactose-alpha-1,3-galactose (alpha-gal) hypersensitivity.  Continue meticulous avoidance of nonprimate mammalian meat and have access to epinephrine autoinjector 2 pack in case of accidental ingestion.  Emergency allergy action plan is in place.  Perennial allergic rhinitis  Aeroallergen avoidance measures have been discussed and provided in written form.  A prescription has been provided for levocetirizine (as above).  Continue fluticasone nasal spray 2 sprays per nostril daily as needed.  I  have also recommended nasal saline spray (i.e.,  Simply Saline) or nasal saline lavage (i.e., NeilMed) as needed prior to medicated nasal sprays.   Meds ordered this encounter  Medications  . levocetirizine (XYZAL) 5 MG tablet    Sig: Take 0.5 tablets (2.5 mg total) by mouth every evening.    Dispense:  30 tablet    Refill:  5    Diagnositics: Environmental skin testing: Positive to cat hair, dog epithelia, mixed feathers, cockroach, and dust mite antigen. Food allergen skin testing: Borderline positive to shrimp.    Physical examination: Blood pressure 122/74, pulse 60, temperature 97.9 F (36.6 C), temperature source Oral, resp. rate 16, height 5' 8.5" (1.74 m), weight 202 lb (91.6 kg).  General: Alert, interactive, in no acute distress. HEENT: TMs pearly gray, turbinates mildly edematous without discharge, post-pharynx mildly erythematous. Neck: Supple without lymphadenopathy. Lungs: Clear to auscultation without wheezing, rhonchi or rales. CV: Normal S1, S2 without murmurs. Abdomen: Nondistended, nontender. Skin: Warm and dry, without lesions or rashes. Extremities:  No clubbing, cyanosis or edema. Neuro:   Grossly intact.  Review of systems:  Review of systems negative except as noted in HPI / PMHx or noted below: Review of Systems  Constitutional: Negative.   HENT: Negative.   Eyes: Negative.   Respiratory: Negative.   Cardiovascular: Negative.   Gastrointestinal: Negative.   Genitourinary: Negative.   Musculoskeletal: Negative.   Skin: Negative.   Neurological: Negative.   Endo/Heme/Allergies: Negative.   Psychiatric/Behavioral: Negative.     Past medical history:  Past Medical History:  Diagnosis Date  . Allergy   . Anginal pain ([place])   . Arthritis   . CAD (coronary artery disease), native coronary artery 12/03/2015  . Coronary artery disease   . Dysrhythmia    "irregular and PAC'S"  . GERD (gastroesophageal reflux disease)   . Headache   . Hyperlipidemia   . Hypertension   . Hypothyroid   .  Multiple benign nevi   . Neuromuscular disorder ([place]) 2011   LEFT RADIAL NERVE SURGERY R/T TRAUMA  . Pneumonia 2014, 2017  . Prostatism   . Pulmonary nodule   . Seasonal allergies     Past surgical history:  Past Surgical History:  Procedure Laterality Date  . BACK SURGERY     T12 - L1  . CARDIAC CATHETERIZATION N/A 12/02/2015   Procedure: Left Heart Cath and Coronary Angiography;  Surgeon: Peter M Martinique, MD;  Location: [facility];  Service: Cardiovascular;  Laterality: N/A;  . CARDIAC CATHETERIZATION N/A 12/02/2015   Procedure: Coronary Stent Intervention;  Surgeon: Peter M Martinique, MD;  Location: [facility];  Service: Cardiovascular;  Laterality: N/A;  mid LAD Promus 2.5x12  . CORONARY ANGIOPLASTY    . heart stint    . MASS EXCISION  06/25/2012   Procedure: EXCISION MASS;  Surgeon: Wynonia Sours, MD;  Location: [facility];  Service: Orthopedics;  Laterality: Left;  transection of NEUROMA, BURYING RADIAL NERVE IN BRACHIORADIALIS LEFT SIDE  . radial nerve    . sinus signs    . SPINAL FUSION     C6-7  . TONSILLECTOMY    . TONSILLECTOMY  1982  . VASECTOMY      Family history: Family History  Problem Relation Age of Onset  . Heart failure Mother 51    Died age 60  . Congestive Heart Failure Mother   . Heart attack Father 11    Died with MI  . Colon cancer  Neg Hx     Social history: Social History   Social History  . Marital status: Married    Spouse name: N/A  . Number of children: 3  . Years of education: N/A   Occupational History  . [employer] [place] History Main Topics  . Smoking status: Former Smoker    Packs/day: 1.00    Years: 20.00    Types: Cigarettes    Quit date: 11/28/2006  . Smokeless tobacco: Never Used  . Alcohol use 0.0 oz/week     Comment: rarely  . Drug use: No  . Sexual activity: Yes   Other Topics Concern  . Not on file   Social History Narrative   Lives at home with wife, new 70 year  old adopted boy.     Environmental History: The patient lives in a house built in [place] with carpeting in the bedroom and central air/heat.  There is a dog and a cat in the house which have access to his bedroom.  He is a former cigarette smoker having quit 10 years ago.    Medication List       Accurate as of 04/03/16 12:05 PM. Always use your most recent med list.          acetaminophen 500 MG tablet Commonly known as:  TYLENOL Take 1,000 mg by mouth every 6 (six) hours as needed (pain).   amLODipine 5 MG tablet Commonly known as:  NORVASC Take 1 tablet (5 mg total) by mouth daily.   aspirin EC 81 MG tablet Take 81 mg by mouth at bedtime.   atorvastatin 80 MG tablet Commonly known as:  LIPITOR Take 1 tablet (80 mg total) by mouth daily.   clopidogrel 75 MG tablet Commonly known as:  PLAVIX Take 1 tablet (75 mg total) by mouth daily.   diphenhydrAMINE 25 MG tablet Commonly known as:  BENADRYL Take 50 mg by mouth every 6 (six) hours as needed for itching.   EPINEPHrine 0.3 mg/0.3 mL Soaj injection Commonly known as:  EPI-PEN Inject 0.3 mLs (0.3 mg total) into the muscle once.   esomeprazole 40 MG capsule Commonly known as:  NEXIUM Take 1 capsule (40 mg total) by mouth daily at 12 noon.   hydrochlorothiazide 25 MG tablet Commonly known as:  HYDRODIURIL Take 1 tablet (25 mg total) by mouth daily.   levocetirizine 5 MG tablet Commonly known as:  XYZAL Take 0.5 tablets (2.5 mg total) by mouth every evening.   levothyroxine 100 MCG tablet Commonly known as:  SYNTHROID, LEVOTHROID Take 1 tablet (100 mcg total) by mouth daily.   Melatonin 10 MG Tabs Take 10 mg by mouth at bedtime.   metoprolol tartrate 25 MG tablet Commonly known as:  LOPRESSOR Take 0.5 tablets (12.5 mg total) by mouth 2 (two) times daily.   nitroGLYCERIN 0.4 MG SL tablet Commonly known as:  NITROSTAT Place 1 tablet (0.4 mg total) under the tongue every 5 (five) minutes as needed for chest  pain.   ondansetron 4 MG tablet Commonly known as:  ZOFRAN Take 1 tablet (4 mg total) by mouth every 8 (eight) hours as needed for nausea or vomiting.       Known medication allergies: Allergies  Allergen Reactions  . Prednisone Other (See Comments)    im depo medrol caused dizziness - pt passed out. Ask pt before giving (pt has taken since the reaction)  . Brilinta [Ticagrelor] Hives  . Doxycycline Hyclate Hives  and Itching  . Penicillins Hives    Has patient had a PCN reaction causing immediate rash, facial/tongue/throat swelling, SOB or lightheadedness with hypotension: NO Pt states has has taken PCN after Vibramycin and not had any problems. Has patient had a PCN reaction causing severe rash involving mucus membranes or skin necrosis: No Has patient had a PCN reaction that required hospitalization No Has patient had a PCN reaction occurring within the last 10 years: No If all of the above answers are "NO", then may proceed with Cephalospo  . Beef-Derived Products Rash  . Fentanyl Rash    Reaction to patch  . Lambs Quarters Rash  . Pork-Derived Products Rash    I appreciate the opportunity to take part in Maxmillian's care. Please do not hesitate to contact me with questions.  Sincerely,   R. Edgar Frisk, MD

## 2016-04-04 ENCOUNTER — Encounter (HOSPITAL_COMMUNITY)
Admission: RE | Admit: 2016-04-04 | Discharge: 2016-04-04 | Disposition: A | Discharge status: Home / Self Care | Payer: BLUE CROSS/BLUE SHIELD | Source: Ambulatory Visit | Attending: Cardiology | Admitting: Cardiology

## 2016-04-04 DIAGNOSIS — Z955 Presence of coronary angioplasty implant and graft: Secondary | ICD-10-CM

## 2016-04-04 LAB — ALLERGY-SHELLFISH PANEL
Clams: <0.1 kU/L
Crab: 3 kU/L — ABNORMAL HIGH
Lobster: 1.6 kU/L — ABNORMAL HIGH
Shrimp IgE: 3.88 kU/L — ABNORMAL HIGH

## 2016-04-04 LAB — SEDIMENTATION RATE: Sed Rate: 1 mm/hr (ref 0–20)

## 2016-04-04 NOTE — Progress Notes (Signed)
Daily Session Note  Patient Details  Name: Jeffrey Hooper MRN: [id number] Date of Birth: 02-23-1961 Referring Provider:   Flowsheet Row CARDIAC REHAB PHASE II ORIENTATION from 01/03/2016 in [facility]  Referring Provider  Dr. [name]      Encounter Date: 04/04/2016  Check In:     Session Check In - 04/04/16 0815      Check-In   Location [facility]   Staff Present Russella Dar, MS, EP, [facility], Exercise Physiologist;Debra Wynetta Emery, RN, BSN;Isha Seefeld, BS, EP, Exercise Physiologist   Supervising physician immediately available to respond to emergencies See telemetry face sheet for immediately available MD   Medication changes reported     No   Fall or balance concerns reported    No   Warm-up and Cool-down Performed as group-led instruction   Resistance Training Performed No   VAD Patient? No     Pain Assessment   Currently in Pain? No/denies   Pain Score 0-No pain   Multiple Pain Sites No      Capillary Blood Glucose: No results found for this or any previous visit (from the past 24 hour(s)).   Goals Met:  Independence with exercise equipment Exercise tolerated well No report of cardiac concerns or symptoms Strength training completed today  Goals Unmet:  Not Applicable  Comments: Check out 915   Dr. Kate Sable is Medical Director for [place] and Pulmonary Rehab.

## 2016-04-06 ENCOUNTER — Encounter (HOSPITAL_COMMUNITY)
Admission: RE | Admit: 2016-04-06 | Discharge: 2016-04-06 | Disposition: A | Discharge status: Home / Self Care | Payer: BLUE CROSS/BLUE SHIELD | Source: Ambulatory Visit | Attending: Cardiology | Admitting: Cardiology

## 2016-04-06 DIAGNOSIS — Z955 Presence of coronary angioplasty implant and graft: Secondary | ICD-10-CM | POA: Diagnosis not present

## 2016-04-06 LAB — TRYPTASE: Tryptase: 7.1 ug/L (ref <11)

## 2016-04-06 NOTE — Progress Notes (Signed)
Daily Session Note  Patient Details  Name: Jeffrey Hooper MRN: [id number] Date of Birth: [birth year] Referring Provider:   Flowsheet Row CARDIAC REHAB PHASE II ORIENTATION from 01/03/2016 in [facility]  Referring Provider  Dr. [name]      Encounter Date: 04/06/2016  Check In:     Session Check In - 04/06/16 0815      Check-In   Location [facility]   Staff Present Russella Dar, MS, EP, [facility], Exercise Physiologist;Debra Wynetta Emery, RN, BSN;Mettie Roylance, BS, EP, Exercise Physiologist   Supervising physician immediately available to respond to emergencies See telemetry face sheet for immediately available MD   Medication changes reported     No   Fall or balance concerns reported    No   Warm-up and Cool-down Performed as group-led instruction   Resistance Training Performed Yes   VAD Patient? No     Pain Assessment   Pain Score 0-No pain   Multiple Pain Sites No      Capillary Blood Glucose: No results found for this or any previous visit (from the past 24 hour(s)).   Goals Met:  Independence with exercise equipment Exercise tolerated well No report of cardiac concerns or symptoms Strength training completed today  Goals Unmet:  Not Applicable  Comments: Check out 915   Dr. Kate Sable is Medical Director for [place] and Pulmonary Rehab.

## 2016-04-09 ENCOUNTER — Encounter (HOSPITAL_COMMUNITY): Payer: BLUE CROSS/BLUE SHIELD

## 2016-04-11 ENCOUNTER — Encounter (HOSPITAL_COMMUNITY)
Admission: RE | Admit: 2016-04-11 | Discharge: 2016-04-11 | Disposition: A | Discharge status: Home / Self Care | Payer: BLUE CROSS/BLUE SHIELD | Source: Ambulatory Visit | Attending: Cardiology | Admitting: Cardiology

## 2016-04-11 DIAGNOSIS — Z955 Presence of coronary angioplasty implant and graft: Secondary | ICD-10-CM

## 2016-04-11 NOTE — Progress Notes (Signed)
Daily Session Note  Patient Details  Name: Jeffrey Hooper MRN: [id number] Date of Birth: June 07, 1961 Referring Provider:   Flowsheet Row CARDIAC REHAB PHASE II ORIENTATION from 01/03/2016 in [facility]  Referring Provider  Dr. [name]      Encounter Date: 04/11/2016  Check In:     Session Check In - 04/11/16 0815      Check-In   Location [facility]   Staff Present Aundra Dubin, RN, BSN;Jordyne Poehlman Luther Parody, BS, EP, Exercise Physiologist   Supervising physician immediately available to respond to emergencies See telemetry face sheet for immediately available MD   Medication changes reported     No   Fall or balance concerns reported    No   Warm-up and Cool-down Performed as group-led instruction   Resistance Training Performed Yes   VAD Patient? No     Pain Assessment   Currently in Pain? No/denies   Pain Score 0-No pain   Multiple Pain Sites No      Capillary Blood Glucose: No results found for this or any previous visit (from the past 24 hour(s)).   Goals Met:  Independence with exercise equipment Exercise tolerated well No report of cardiac concerns or symptoms Strength training completed today  Goals Unmet:  Not Applicable  Comments: Check out 915   Dr. Kate Sable is Medical Director for [place] and Pulmonary Rehab.

## 2016-04-13 ENCOUNTER — Encounter (HOSPITAL_COMMUNITY)
Admission: RE | Admit: 2016-04-13 | Discharge: 2016-04-13 | Disposition: A | Discharge status: Home / Self Care | Payer: BLUE CROSS/BLUE SHIELD | Source: Ambulatory Visit | Attending: Cardiology | Admitting: Cardiology

## 2016-04-13 VITALS — Ht 69.0 in | Wt 202.4 lb

## 2016-04-13 DIAGNOSIS — Z955 Presence of coronary angioplasty implant and graft: Secondary | ICD-10-CM | POA: Diagnosis not present

## 2016-04-13 NOTE — Progress Notes (Signed)
Discharge Summary  Patient Details  Name: Jeffrey Hooper MRN: [id number] Date of Birth: January 25, 1961 Referring Provider:   Flowsheet Row CARDIAC REHAB PHASE II ORIENTATION from 01/03/2016 in [facility]  Referring Provider  Dr. [name]       Number of Visits: 26  Reason for Discharge:  Patient reached a stable level of exercise. Patient independent in their exercise.  Smoking History:  History  Smoking Status  . Former Smoker  . Packs/day: 1.00  . Years: 20.00  . Types: Cigarettes  . Quit date: 11/28/2006  Smokeless Tobacco  . Never Used    Diagnosis:  Stented coronary artery  ADL UCSD:   Initial Exercise Prescription:     Initial Exercise Prescription - 01/03/16 1800      Date of Initial Exercise RX and Referring Provider   Date 01/03/16   Referring Provider Dr. [name]     Treadmill   MPH 1.5   Grade 0   Minutes 15   METs 2.1     T5 Nustep   Level 2   Watts 25   Minutes 20   METs 1.9     Prescription Details   Frequency (times per week) 3   Duration Progress to 30 minutes of continuous aerobic without signs/symptoms of physical distress     Intensity   THRR REST +  30   THRR 40-80% of Max Heartrate [phone]   Ratings of Perceived Exertion 11-13   Perceived Dyspnea 0-4     Progression   Progression Continue to progress workloads to maintain intensity without signs/symptoms of physical distress.     Resistance Training   Training Prescription Yes   Weight 1   Reps 10-12      Discharge Exercise Prescription (Final Exercise Prescription Changes):     Exercise Prescription Changes - 03/15/16 1200      Exercise Review   Progression Yes     Response to Exercise   Blood Pressure (Admit) 110/56   Blood Pressure (Exercise) 124/62   Blood Pressure (Exit) 108/60   Heart Rate (Admit) 48 bpm   Heart Rate (Exercise) 87 bpm   Heart Rate (Exit) 57 bpm   Rating of Perceived Exertion (Exercise) 9   Duration Progress to  30 minutes of continuous aerobic without signs/symptoms of physical distress   Intensity Rest + 30     Progression   Progression Continue to progress workloads to maintain intensity without signs/symptoms of physical distress.     Resistance Training   Training Prescription Yes   Weight 5   Reps 10-12     Treadmill   MPH 2.8   Grade 0   Minutes 20   METs 3.1     T5 Nustep   Level 3   Watts 56   Minutes 15   METs 3.63     Home Exercise Plan   Plans to continue exercise at Home   Frequency Add 2 additional days to program exercise sessions.      Functional Capacity:     6 Minute Walk    Row Name 01/03/16 1802 04/13/16 1233       6 Minute Walk   Phase Initial Discharge    Distance 1300 feet 1900 feet    Distance % Change  - 46.15 %    Walk Time 6 minutes 6 minutes    # of Rest Breaks 0 0    MPH 2.46 3.6    METS 2.88 3.75    RPE  11 9    Perceived Dyspnea  12 8    VO2 Peak 12.2 16.54    Symptoms No No    Resting HR 51 bpm 60 bpm    Resting BP 142/62 110/54    Max Ex. HR 82 bpm 88 bpm    Max Ex. BP 144/74 152/72    2 Minute Post BP 118/62 120/68       Psychological, QOL, Others - Outcomes: PHQ 2/9: Depression screen [facility] 2/9 04/13/2016 02/17/2016 01/03/2016 10/05/2015 08/17/2015  Decreased Interest 0 0 0 0 0  Down, Depressed, Hopeless 0 0 0 0 0  PHQ - 2 Score 0 0 0 0 0  Altered sleeping 0 - 1 - -  Tired, decreased energy 0 - 0 - -  Change in appetite 0 - 0 - -  Feeling bad or failure about yourself  0 - 0 - -  Trouble concentrating 0 - 0 - -  Moving slowly or fidgety/restless 0 - 0 - -  Suicidal thoughts 0 - 0 - -  PHQ-9 Score 0 - 1 - -  Difficult doing work/chores - - Not difficult at all - -    Quality of Life:     Quality of Life - 04/13/16 1235      Quality of Life Scores   Health/Function Pre 24 %   Health/Function Post 28.8 %   Health/Function % Change 20 %   Socioeconomic Pre 28.29 %   Socioeconomic Post 30 %   Socioeconomic % Change  6.04  %   Psych/Spiritual Pre 28.29 %   Psych/Spiritual Post 29.14 %   Psych/Spiritual % Change 3 %   Family Pre 25.2 %   Family Post 30 %   Family % Change 19.05 %   GLOBAL Pre 25.94 %   GLOBAL Post 29.27 %   GLOBAL % Change 12.84 %      Personal Goals: Goals established at orientation with interventions provided to work toward goal.     Personal Goals and Risk Factors at Admission - 01/03/16 1810      Core Components/Risk Factors/Patient Goals on Admission    Weight Management Weight Loss   Sedentary --  No   Increase Strength and Stamina Yes   Intervention Provide advice, education, support and counseling about physical activity/exercise needs.   Expected Outcomes Achievement of increased cardiorespiratory fitness and enhanced flexibility, muscular endurance and strength shown through measurements of functional capacity and personal statement of participant.   Tobacco Cessation --  No   Hypertension Yes   Intervention Provide education on lifestyle modifcations including regular physical activity/exercise, weight management, moderate sodium restriction and increased consumption of fresh fruit, vegetables, and low fat dairy, alcohol moderation, and smoking cessation.   Expected Outcomes Short Term: Continued assessment and intervention until BP is < 140/65mm HG in hypertensive participants. < 130/9mm HG in hypertensive participants with diabetes, heart failure or chronic kidney disease.;Long Term: Maintenance of blood pressure at goal levels.   Personal Goal Other Yes   Personal Goal No more episodes of SOB, Make sure heart is back to normal.    Intervention come to class 3xwee and to suppliment with 2 days outside of CR class.    Expected Outcomes To feel better, less SOB, live heart healthy lifestyle.        Personal Goals Discharge:     Goals and Risk Factor Review    Row Name 01/30/16 1754 03/01/16 0735 03/22/16 1407 04/13/16 1257  Core Components/Risk  Factors/Patient Goals Review   Personal Goals Review Improve shortness of breath with ADL's;Increase Strength and Stamina Improve shortness of breath with ADL's;Increase Strength and Stamina Increase Strength and Stamina;Improve shortness of breath with ADL's Improve shortness of breath with ADL's;Increase Strength and Stamina;Weight Management/Obesity    Review Improve SOB with exertion, get back to normal with ADL's and heart health in general.  Patient is doing well in the program. Patient is having less SOB and is getting stronger. After 27 sessions, patient is doing well in the program. Patient continues to have less SOB and is getting stronger. He has lost 6.2 lbs since starting program.  Upon graduation, patient lost appx 8.2 lbs. He says his strenght and stamina have increased and he is not having SOB with activity. He says the program has benefited him alot.     Expected Outcomes Little to no SOB with ADL's Continue to increase stamina and strength with improved SOB.  Patient will continue to meet the above stated goals.  Patient will continue to exercise and meet the above stated goals.        Nutrition & Weight - Outcomes:     Pre Biometrics - 01/03/16 1807      Pre Biometrics   Height 5\' 9"  (1.753 m)   Weight 211 lb 3.2 oz (95.8 kg)   Waist Circumference 39.7 inches   Hip Circumference 42 inches   Waist to Hip Ratio 0.95 %   BMI (Calculated) 31.3   Triceps Skinfold 12 mm   % Body Fat 27.2 %   Grip Strength 117 kg   Flexibility 18 in   Single Leg Stand 30 seconds         Post Biometrics - 04/13/16 1234       Post  Biometrics   Height 5\' 9"  (1.753 m)   Weight 202 lb 6.1 oz (91.8 kg)   Waist Circumference 39 inches   Hip Circumference 41 inches   Waist to Hip Ratio 0.95 %   BMI (Calculated) 29.9   Triceps Skinfold 10 mm   % Body Fat 25.6 %   Grip Strength 116 kg   Flexibility 13.9 in   Single Leg Stand 5 seconds      Nutrition:   Nutrition Discharge:      Nutrition Assessments - 04/13/16 1255      MEDFICTS Scores   Pre Score 42   Post Score 61   Score Difference 19      Education Questionnaire Score:     Knowledge Questionnaire Score - 04/13/16 1255      Knowledge Questionnaire Score   Pre Score 21/24   Post Score 21/24      Goals reviewed with patient; copy given to patient.

## 2016-04-13 NOTE — Progress Notes (Signed)
Daily Session Note  Patient Details  Name: Jeffrey Hooper MRN: [id number] Date of Birth: September 04, 1960 Referring Provider:   Flowsheet Row CARDIAC REHAB PHASE II ORIENTATION from 01/03/2016 in [facility]  Referring Provider  Dr. [name]      Encounter Date: 04/13/2016  Check In:     Session Check In - 04/13/16 0815      Check-In   Location [facility]   Staff Present Aundra Dubin, RN, BSN;Kassiah Mccrory Luther Parody, BS, EP, Exercise Physiologist   Supervising physician immediately available to respond to emergencies See telemetry face sheet for immediately available MD   Medication changes reported     No   Fall or balance concerns reported    No   Warm-up and Cool-down Performed as group-led instruction   Resistance Training Performed Yes   VAD Patient? No     Pain Assessment   Currently in Pain? No/denies   Pain Score 0-No pain   Multiple Pain Sites No      Capillary Blood Glucose: No results found for this or any previous visit (from the past 24 hour(s)).   Goals Met:  Independence with exercise equipment Exercise tolerated well No report of cardiac concerns or symptoms Strength training completed today  Goals Unmet:  Not Applicable  Comments: Check out 915   Dr. Kate Sable is Medical Director for [facility] and [facility].

## 2016-04-16 LAB — CP CHRONIC URTICARIA INDEX PANEL
Histamine Release: <16 % (ref <16)
TSH: 2.89 mIU/L (ref 0.40–4.50)
Thyroglobulin Ab: <1 IU/mL (ref <2)
Thyroperoxidase Ab SerPl-aCnc: <1 IU/mL (ref <9)

## 2016-04-24 ENCOUNTER — Other Ambulatory Visit: Payer: BLUE CROSS/BLUE SHIELD

## 2016-04-24 DIAGNOSIS — E785 Hyperlipidemia, unspecified: Secondary | ICD-10-CM | POA: Diagnosis not present

## 2016-04-24 DIAGNOSIS — Z79899 Other long term (current) drug therapy: Secondary | ICD-10-CM

## 2016-04-25 LAB — HEPATIC FUNCTION PANEL
ALK PHOS: 85 IU/L (ref 39–117)
ALT: 32 IU/L (ref 0–44)
AST: 26 IU/L (ref 0–40)
Albumin: 4.3 g/dL (ref 3.5–5.5)
BILIRUBIN TOTAL: 0.9 mg/dL (ref 0.0–1.2)
BILIRUBIN, DIRECT: 0.23 mg/dL (ref 0.00–0.40)
Total Protein: 6 g/dL (ref 6.0–8.5)

## 2016-04-25 LAB — PLEASE NOTE

## 2016-04-25 LAB — LIPID PANEL
CHOLESTEROL TOTAL: 109 mg/dL (ref 100–199)
Chol/HDL Ratio: 2.8 ratio units (ref 0.0–5.0)
HDL: 39 mg/dL — ABNORMAL LOW (ref >39)
LDL CALC: 47 mg/dL (ref 0–99)
TRIGLYCERIDES: 116 mg/dL (ref 0–149)
VLDL Cholesterol Cal: 23 mg/dL (ref 5–40)

## 2016-05-04 ENCOUNTER — Telehealth: Payer: Self-pay | Admitting: Allergy

## 2016-05-04 ENCOUNTER — Other Ambulatory Visit: Payer: Self-pay | Admitting: Allergy

## 2016-05-04 MED ORDER — LEVOCETIRIZINE DIHYDROCHLORIDE 5 MG PO TABS: ORAL_TABLET | ORAL | 1 refills | Status: DC

## 2016-05-04 NOTE — Telephone Encounter (Signed)
Pt called starting was breaking out with hives on legs and arms.  He reports he has not eaten any foods he is suppose to be avoiding including red meats and shellfish.  Currently taking 2.5 xyzal daily.  Advised he take 2.5mg  xyzal twice a day. If he is still having symptoms then he can add zantac 150mg  twice a day to his xyzal regimen.

## 2016-05-13 ENCOUNTER — Other Ambulatory Visit: Payer: Self-pay | Admitting: Pediatrics

## 2016-05-13 DIAGNOSIS — E039 Hypothyroidism, unspecified: Secondary | ICD-10-CM

## 2016-05-13 DIAGNOSIS — I10 Essential (primary) hypertension: Secondary | ICD-10-CM

## 2016-05-14 DIAGNOSIS — Z23 Encounter for immunization: Secondary | ICD-10-CM | POA: Diagnosis not present

## 2016-05-17 ENCOUNTER — Other Ambulatory Visit: Payer: Self-pay | Admitting: Cardiology

## 2016-05-17 NOTE — Telephone Encounter (Signed)
Rx request sent to pharmacy.  

## 2016-07-26 ENCOUNTER — Encounter: Payer: Self-pay | Admitting: Pediatrics

## 2016-07-26 ENCOUNTER — Ambulatory Visit (INDEPENDENT_AMBULATORY_CARE_PROVIDER_SITE_OTHER): Payer: BLUE CROSS/BLUE SHIELD | Admitting: Pediatrics

## 2016-07-26 ENCOUNTER — Ambulatory Visit (INDEPENDENT_AMBULATORY_CARE_PROVIDER_SITE_OTHER): Payer: BLUE CROSS/BLUE SHIELD

## 2016-07-26 VITALS — BP 135/81 | HR 78 | Temp 100.2°F | Resp 22 | Ht 69.0 in | Wt 206.0 lb

## 2016-07-26 DIAGNOSIS — H6501 Acute serous otitis media, right ear: Secondary | ICD-10-CM | POA: Diagnosis not present

## 2016-07-26 DIAGNOSIS — R6889 Other general symptoms and signs: Secondary | ICD-10-CM

## 2016-07-26 DIAGNOSIS — R05 Cough: Secondary | ICD-10-CM

## 2016-07-26 DIAGNOSIS — R059 Cough, unspecified: Secondary | ICD-10-CM

## 2016-07-26 DIAGNOSIS — Z20828 Contact with and (suspected) exposure to other viral communicable diseases: Secondary | ICD-10-CM | POA: Diagnosis not present

## 2016-07-26 LAB — VERITOR FLU A/B WAIVED
INFLUENZA A: NEGATIVE
Influenza B: NEGATIVE

## 2016-07-26 MED ORDER — GUAIFENESIN-CODEINE 100-10 MG/5ML PO SOLN: 5.0000 mL | Freq: Three times a day (TID) | ORAL | 0 refills | Status: DC | PRN

## 2016-07-26 MED ORDER — OSELTAMIVIR PHOSPHATE 75 MG PO CAPS: 75.0000 mg | ORAL_CAPSULE | Freq: Two times a day (BID) | ORAL | 0 refills | Status: DC

## 2016-07-26 MED ORDER — FLUTICASONE PROPIONATE 50 MCG/ACT NA SUSP: 2.0000 | Freq: Every day | NASAL | 6 refills | Status: DC

## 2016-07-26 MED ORDER — AZITHROMYCIN 250 MG PO TABS: ORAL_TABLET | ORAL | 0 refills | Status: DC

## 2016-07-26 NOTE — Progress Notes (Signed)
  Subjective:   Patient ID: Jeffrey Hooper, male    DOB: 15-May-1961, 55 y.o.   MRN: [id number] CC: Cough; Fever; Generalized Body Aches; and Shortness of Breath  HPI: Jeffrey Hooper is a 55 y.o. male presenting for Cough; Fever; Generalized Body Aches; and Shortness of Breath  Started getting sick two days ago Coughing, body aches Mostly dry cough Ears are ringing Wife tested positive for flu earlier this week, put on tamiflu Taking tylenol regularly Temp to 100.4 at home No nausea/vomiting Has chest tightness with coughing   Relevant past medical, surgical, family and social history reviewed. Allergies and medications reviewed and updated. History  Smoking Status  . Former Smoker  . Packs/day: 1.00  . Years: 20.00  . Types: Cigarettes  . Quit date: 11/28/2006  Smokeless Tobacco  . Never Used   ROS: Per HPI   Objective:    BP 135/81   Pulse 78   Temp 100.2 F (37.9 C) (Oral)   Resp (!) 22   Ht 5\' 9"  (1.753 m)   Wt 206 lb (93.4 kg)   SpO2 97%   BMI 30.42 kg/m   Wt Readings from Last 3 Encounters:  07/26/16 206 lb (93.4 kg)  04/13/16 202 lb 6.1 oz (91.8 kg)  04/03/16 202 lb (91.6 kg)    Gen: NAD, alert, cooperative with exam, NCAT EYES: EOMI, no conjunctival injection, or no icterus ENT:  B/l TMs with white effusion, nl LR, R TM surface injected CV: NRRR, normal S1/S2, no murmur Resp: moving air well, slight wheeze RLL, comfortable WOB, coughing often Neuro: Alert and oriented MSK: normal muscle bulk  CXR: streaks RLL, clear heart borders, diaphragm  Assessment & Plan:  Nassim was seen today for cough, fever, generalized body aches and shortness of breath.  Diagnoses and all orders for this visit:  Flu like symptoms after exposure to the flu Will treat given symptoms despite neg flu test in clinic Cont tylenol -     oseltamivir (TAMIFLU) 75 MG capsule; Take 1 capsule (75 mg total) by mouth 2 (two) times daily.  Cough Streaks likely viral though  treating with azithromycin for AOM as below, would cover CAP Start abx if symptoms worsening -     DG Chest 2 View; Future -     guaiFENesin-codeine 100-10 MG/5ML syrup; Take 5-10 mLs by mouth 3 (three) times daily as needed for cough.  Right acute serous otitis media, recurrence not specified flonase has tolerated in past -     azithromycin (ZITHROMAX) 250 MG tablet; Take 2 the first day and then one each day after. -     fluticasone (FLONASE) 50 MCG/ACT nasal spray; Place 2 sprays into both nostrils daily.   Follow up plan: As needed Assunta Found, MD [place]

## 2016-08-01 ENCOUNTER — Telehealth: Payer: Self-pay | Admitting: Pediatrics

## 2016-08-01 NOTE — Telephone Encounter (Signed)
The Zithromax works in his system for 10 days even though he only take it for 5 so he should still be improving from the antibiotic, also recommend for him to use Flonase and Mucinex, she did give him some cough suppressant and we do not really have any that are stronger than that. If he is really still feeling ill that he may need to come in and be seen and have a chest x-ray. Remember that the cough can persist for 3-4 weeks after an illness so if that is his only symptoms and then he may not need to be seen

## 2016-08-01 NOTE — Telephone Encounter (Signed)
Patient aware of recommendations.  

## 2016-08-01 NOTE — Telephone Encounter (Signed)
Patient called stating that he is not feeling any better.  Patient complains of cough, chest congestion, headache and ear pain that has improved some.   NO fevers.

## 2016-08-31 ENCOUNTER — Other Ambulatory Visit: Payer: Self-pay | Admitting: Cardiology

## 2016-08-31 DIAGNOSIS — I10 Essential (primary) hypertension: Secondary | ICD-10-CM

## 2016-09-12 ENCOUNTER — Ambulatory Visit (INDEPENDENT_AMBULATORY_CARE_PROVIDER_SITE_OTHER): Payer: BLUE CROSS/BLUE SHIELD | Admitting: *Deleted

## 2016-09-12 ENCOUNTER — Other Ambulatory Visit: Payer: BLUE CROSS/BLUE SHIELD

## 2016-09-12 ENCOUNTER — Ambulatory Visit (INDEPENDENT_AMBULATORY_CARE_PROVIDER_SITE_OTHER): Payer: BLUE CROSS/BLUE SHIELD | Admitting: Cardiology

## 2016-09-12 ENCOUNTER — Encounter: Payer: Self-pay | Admitting: Cardiology

## 2016-09-12 VITALS — BP 120/78 | HR 52 | Ht 70.5 in | Wt 203.0 lb

## 2016-09-12 DIAGNOSIS — R002 Palpitations: Secondary | ICD-10-CM

## 2016-09-12 DIAGNOSIS — E785 Hyperlipidemia, unspecified: Secondary | ICD-10-CM | POA: Diagnosis not present

## 2016-09-12 DIAGNOSIS — Z79899 Other long term (current) drug therapy: Secondary | ICD-10-CM

## 2016-09-12 DIAGNOSIS — I10 Essential (primary) hypertension: Secondary | ICD-10-CM

## 2016-09-12 NOTE — Progress Notes (Signed)
30 day event monitor placed [facility] [phone]

## 2016-09-12 NOTE — Patient Instructions (Signed)
Medication Instructions:  The current medical regimen is effective;  continue present plan and medications.  Labwork: Please have blood work at [facility]  (CMP, TSH, CBC and Lipid)  Testing/Procedures: Your physician has recommended that you wear an event monitor. Event monitors are medical devices that record the heart's electrical activity. Doctors most often [place] these monitors to diagnose arrhythmias. Arrhythmias are problems with the speed or rhythm of the heartbeat. The monitor is a small, portable device. You can wear one while you do your normal daily activities. This is usually used to diagnose what is causing palpitations/syncope (passing out).  Follow-Up: Follow up in 1 month with Dr [name] after testing has been completed.  If you need a refill on your cardiac medications before your next appointment, please call your pharmacy.  Thank you for choosing [facility]!!

## 2016-09-12 NOTE — Progress Notes (Signed)
Cardiology Office Note   Date:  09/12/2016   ID:  Jeffrey Hooper, DOB 1960-12-06, MRN [id number]  PCP:  [name], MD  Cardiologist:   Minus Breeding, MD   No chief complaint on file.     History of Present Illness: Jeffrey Hooper is a 56 y.o. male who presents initially for evaluation of recent PCI.  Recently he had chest pain and palpitations. Stress perfusion study which suggested a moderate sized defect in the inferior wall with ischemia. The EF was well-preserved.  The patient presented to [facility] on 12/02/15 for cath and was found to have single vessel obstructive CAD involving the mid LAD. He underwent successful PCI with a mid LAD DES.  He was placed on DAPT with ASA and Brilinta, high intensity statin, metoprolol and amlodipine.  Beta blocker was stopped in hospital because of bradycardia.   He had to come back to the hospital with tachycardia however thought to be related to the beta blocker withdrawal.  His beta blocker was restarted.  Also the patient seemed to have hives with Brilinta.  He was switched to Plavix.     Today he comes in and says that he is having pounding heart rates. This happens weekly. He feels like his body his body is jerking.  He did wear an event monitor a little over a year ago and had some atrial arrhythmias and other ectopy but no sustained dysrhythmias. He's not had any presyncope or syncope. He denies any chest pressure, neck or arm discomfort. He's had no weight gain or edema. He does have some fleeting chest discomfort. This is been a stable pattern. He's not having any acute dyspnea that he had was his previous angina. He still works and has to keep up with an 29-year-old adopted son.  He cannot bring on any of his symptoms.    Past Medical History:  Diagnosis Date  . Allergy   . Anginal pain ([place])   . Arthritis   . CAD (coronary artery disease), native coronary artery 12/03/2015  . Coronary artery disease   . Dysrhythmia    "irregular and  PAC'S"  . GERD (gastroesophageal reflux disease)   . Headache   . Hyperlipidemia   . Hypertension   . Hypothyroid   . Multiple benign nevi   . Neuromuscular disorder ([place]) 2011   LEFT RADIAL NERVE SURGERY R/T TRAUMA  . Pneumonia 2014, 2017  . Prostatism   . Pulmonary nodule   . Seasonal allergies     Past Surgical History:  Procedure Laterality Date  . BACK SURGERY     T12 - L1  . CARDIAC CATHETERIZATION N/A 12/02/2015   Procedure: Left Heart Cath and Coronary Angiography;  Surgeon: Peter M Martinique, MD;  Location: [facility];  Service: Cardiovascular;  Laterality: N/A;  . CARDIAC CATHETERIZATION N/A 12/02/2015   Procedure: Coronary Stent Intervention;  Surgeon: Peter M Martinique, MD;  Location: [facility];  Service: Cardiovascular;  Laterality: N/A;  mid LAD Promus 2.5x12  . CORONARY ANGIOPLASTY    . heart stint    . MASS EXCISION  06/25/2012   Procedure: EXCISION MASS;  Surgeon: Wynonia Sours, MD;  Location: [facility];  Service: Orthopedics;  Laterality: Left;  transection of NEUROMA, BURYING RADIAL NERVE IN BRACHIORADIALIS LEFT SIDE  . radial nerve    . sinus signs    . SPINAL FUSION     C6-7  . TONSILLECTOMY    .  TONSILLECTOMY  1982  . VASECTOMY       Current Outpatient Prescriptions  Medication Sig Dispense Refill  . acetaminophen (TYLENOL) 500 MG tablet Take 1,000 mg by mouth every 6 (six) hours as needed (pain).     [name] amLODipine (NORVASC) 5 MG tablet TAKE 1 TABLET (5 MG TOTAL) BY MOUTH DAILY. 30 tablet 0  . aspirin EC 81 MG tablet Take 81 mg by mouth at bedtime.    [name] atorvastatin (LIPITOR) 80 MG tablet Take 1 tablet (80 mg total) by mouth daily. 90 tablet 3  . clopidogrel (PLAVIX) 75 MG tablet Take 1 tablet (75 mg total) by mouth daily. 30 tablet 11  . diphenhydrAMINE (BENADRYL) 25 MG tablet Take 50 mg by mouth every 6 (six) hours as needed for itching.    . esomeprazole (NEXIUM) 40 MG capsule Take 1 capsule (40 mg total) by mouth daily at  12 noon. (Patient taking differently: Take 40 mg by mouth daily. ) 90 capsule 3  . fluticasone (FLONASE) 50 MCG/ACT nasal spray Place 2 sprays into both nostrils daily. 16 g 6  . hydrochlorothiazide (HYDRODIURIL) 25 MG tablet TAKE 1 TABLET (25 MG TOTAL) BY MOUTH DAILY. 90 tablet 1  . levocetirizine (XYZAL) 5 MG tablet Take 2.5mg  twice a day for hives/itch 30 tablet 1  . levothyroxine (SYNTHROID, LEVOTHROID) 100 MCG tablet TAKE 1 TABLET (100 MCG TOTAL) BY MOUTH DAILY. 90 tablet 1  . Melatonin 10 MG TABS Take 10 mg by mouth at bedtime.     . metoprolol tartrate (LOPRESSOR) 25 MG tablet TAKE 1/2 TABLET (12.5 MG TOTAL) BY MOUTH 2 (TWO) TIMES DAILY. 30 tablet 5  . nitroGLYCERIN (NITROSTAT) 0.4 MG SL tablet Place 1 tablet (0.4 mg total) under the tongue every 5 (five) minutes as needed for chest pain. 50 tablet 0  . EPINEPHrine 0.3 mg/0.3 mL IJ SOAJ injection Inject 0.3 mLs (0.3 mg total) into the muscle once. (Patient not taking: Reported on 09/12/2016) 2 Device 1  . FLUARIX QUADRIVALENT 0.5 ML injection TO BE ADMINISTERED BY PHARMACIST FOR IMMUNIZATION  0   No current facility-administered medications for this visit.     Allergies:   Prednisone; Brilinta [ticagrelor]; Doxycycline hyclate; Penicillins; Shellfish allergy; Beef-derived products; Fentanyl; Lambs quarters; and Pork-derived products    ROS:  Please see the history of present illness.   Otherwise, review of systems are positive for none.   All other systems are reviewed and negative.    PHYSICAL EXAM: VS:  BP 120/78   Pulse (!) 52   Ht 5' 10.5" (1.791 m)   Wt 203 lb (92.1 kg)   BMI 28.72 kg/m  , BMI Body mass index is 28.72 kg/m. GENERAL:  Well appearing NECK:  No jugular venous distention, waveform within normal limits, carotid upstroke brisk and symmetric, no bruits, no thyromegaly LUNGS:  Clear to auscultation bilaterally BACK:  No CVA tenderness CHEST:  Unremarkable HEART:  PMI not displaced or sustained,S1 and S2 within  normal limits, no S3, no S4, no clicks, no rubs, no murmurs ABD:  Flat, positive bowel sounds normal in frequency in pitch, no bruits, no rebound, no guarding, no midline pulsatile mass, no hepatomegaly, no splenomegaly EXT:  2 plus pulses throughout, no edema, no cyanosis no clubbing   EKG:  EKG  is ordered today. Sinus rhythm, rate 45 , axis within normal limits, intervals within normal limits, no acute ST-T wave changes.  Recent Labs: 11/09/2015: Magnesium 2.3 04/03/2016: BUN 15; Creat 1.08; Hemoglobin 15.3; Platelets 214;  Potassium 3.6; Sodium 142; TSH 2.89 04/24/2016: ALT 32    Lipid Panel    Component Value Date/Time   CHOL 109 04/24/2016 0000   CHOL 136 10/29/2012 0922   TRIG 116 04/24/2016 0000   TRIG 139 04/28/2013 1505   TRIG 103 10/29/2012 0922   HDL 39 (L) 04/24/2016 0000   HDL 44 04/28/2013 1505   HDL 41 10/29/2012 0922   CHOLHDL 2.8 04/24/2016 0000   LDLCALC 47 04/24/2016 0000   LDLCALC 50 04/28/2013 1505   LDLCALC 74 10/29/2012 0922      Wt Readings from Last 3 Encounters:  09/12/16 203 lb (92.1 kg)  07/26/16 206 lb (93.4 kg)  04/13/16 202 lb 6.1 oz (91.8 kg)      Other studies Reviewed: Additional studies/ records that were reviewed today include: None Review of the above records demonstrates:    ASSESSMENT AND PLAN:  CAD S/P percutaneous coronary angioplasty I don't suspect new obstructive coronary disease as the dyspnea was his angina and this is gone. I am going to evaluate other symptoms as below however.  Essential hypertension The blood pressure is at target. No change in medications is indicated. We will continue with therapeutic lifestyle changes (TLC).  Palpitations He continues to have these symptoms. I'm going to place another event monitor. Jeffrey Hooper will need a 21 day event monitor.  The patients symptoms necessitate an event monitor.  The symptoms are too infrequent to be identified on a Holter monitor.    For now he'll continue  the medicines as listed. I like to check a TSH.  Dyslipidemia On high dose statin Rx. I will check a lipid profile fasting. He would like to reduce the dose if possible.  Renal insufficiency His creatinine last checked was 1.08. I will repeat a basic metabolic profile.  Anxiety This does contribute somewhat to his symptom. This can continue to be managed by [name], MD     Current medicines are reviewed at length with the patient today.  The patient does not have concerns regarding medicines.  The following changes have been made:  no change  Labs/ tests ordered today include:     Orders Placed This Encounter  Procedures  . Comprehensive metabolic panel  . CBC  . Lipid panel  . TSH  . Cardiac event monitor  . EKG 12-Lead     Disposition:   FU with me in one month.    Signed, Minus Breeding, MD  09/12/2016 7:12 PM     [facility]

## 2016-09-13 LAB — COMPREHENSIVE METABOLIC PANEL
ALBUMIN: 4.3 g/dL (ref 3.5–5.5)
ALK PHOS: 80 IU/L (ref 39–117)
ALT: 32 IU/L (ref 0–44)
AST: 26 IU/L (ref 0–40)
Albumin/Globulin Ratio: 2.3 — ABNORMAL HIGH (ref 1.2–2.2)
BILIRUBIN TOTAL: 0.8 mg/dL (ref 0.0–1.2)
BUN / CREAT RATIO: 12 (ref 9–20)
BUN: 12 mg/dL (ref 6–24)
CHLORIDE: 101 mmol/L (ref 96–106)
CO2: 30 mmol/L — ABNORMAL HIGH (ref 18–29)
Calcium: 9.3 mg/dL (ref 8.7–10.2)
Creatinine, Ser: 1.04 mg/dL (ref 0.76–1.27)
GFR calc non Af Amer: 80 mL/min/{1.73_m2} (ref >59)
GFR, EST AFRICAN AMERICAN: 93 mL/min/{1.73_m2} (ref >59)
GLOBULIN, TOTAL: 1.9 g/dL (ref 1.5–4.5)
Glucose: 83 mg/dL (ref 65–99)
Potassium: 4 mmol/L (ref 3.5–5.2)
SODIUM: 144 mmol/L (ref 134–144)
TOTAL PROTEIN: 6.2 g/dL (ref 6.0–8.5)

## 2016-09-13 LAB — LIPID PANEL
CHOL/HDL RATIO: 2.7 ratio (ref 0.0–5.0)
Cholesterol, Total: 129 mg/dL (ref 100–199)
HDL: 47 mg/dL (ref >39)
LDL Calculated: 58 mg/dL (ref 0–99)
TRIGLYCERIDES: 119 mg/dL (ref 0–149)
VLDL Cholesterol Cal: 24 mg/dL (ref 5–40)

## 2016-09-13 LAB — CBC
HEMATOCRIT: 40.7 % (ref 37.5–51.0)
Hemoglobin: 14.2 g/dL (ref 13.0–17.7)
MCH: 30.1 pg (ref 26.6–33.0)
MCHC: 34.9 g/dL (ref 31.5–35.7)
MCV: 86 fL (ref 79–97)
PLATELETS: 207 10*3/uL (ref 150–379)
RBC: 4.72 x10E6/uL (ref 4.14–5.80)
RDW: 14.1 % (ref 12.3–15.4)
WBC: 6.1 10*3/uL (ref 3.4–10.8)

## 2016-09-13 LAB — TSH: TSH: 3.27 u[IU]/mL (ref 0.450–4.500)

## 2016-09-28 ENCOUNTER — Other Ambulatory Visit: Payer: Self-pay | Admitting: Cardiology

## 2016-09-28 DIAGNOSIS — R002 Palpitations: Secondary | ICD-10-CM | POA: Diagnosis not present

## 2016-09-28 DIAGNOSIS — I10 Essential (primary) hypertension: Secondary | ICD-10-CM

## 2016-10-15 ENCOUNTER — Encounter: Payer: Self-pay | Admitting: Allergy and Immunology

## 2016-10-15 ENCOUNTER — Ambulatory Visit (INDEPENDENT_AMBULATORY_CARE_PROVIDER_SITE_OTHER): Payer: BLUE CROSS/BLUE SHIELD | Admitting: Allergy and Immunology

## 2016-10-15 ENCOUNTER — Other Ambulatory Visit: Payer: Self-pay

## 2016-10-15 VITALS — BP 118/70 | HR 62 | Resp 16 | Wt 203.0 lb

## 2016-10-15 DIAGNOSIS — T7800XD Anaphylactic reaction due to unspecified food, subsequent encounter: Secondary | ICD-10-CM | POA: Diagnosis not present

## 2016-10-15 DIAGNOSIS — J3089 Other allergic rhinitis: Secondary | ICD-10-CM

## 2016-10-15 MED ORDER — LEVOCETIRIZINE DIHYDROCHLORIDE 5 MG PO TABS: ORAL_TABLET | ORAL | 5 refills | Status: DC

## 2016-10-15 MED ORDER — CLOPIDOGREL BISULFATE 75 MG PO TABS: 75.0000 mg | ORAL_TABLET | Freq: Every day | ORAL | 3 refills | Status: DC

## 2016-10-15 NOTE — Assessment & Plan Note (Deleted)
   Continue appropriate allergen avoidance measures, levocetirizine 5 mg daily as needed, and fluticasone nasal spray as needed.  A refill prescription has been provided for levocetirizine.

## 2016-10-15 NOTE — Progress Notes (Signed)
Follow-up Note  RE: ISSAIH KAUS MRN: [id number] DOB: 09-18-1960 Date of Office Visit: 10/15/2016  Primary care provider: [name], MD Referring provider: [name], MD  History of present illness: Jeffrey Hooper is a 56 y.o. male with a history of urticaria and allergic reactions presenting today for follow up.  He is previously seen in this clinic for his initial evaluation in August 2017.  Since his diagnosis of alpha gal hypersensitivity was confirmed, he has avoided mammalian meat and has had no overt allergic reactions.  However, he notes that on one or 2 occasions he has developed mild pruritus and urticaria around the ankles feet, and back of his neck.  These symptoms resolved with diphenhydramine and he does not express concomitant cardiopulmonary or GI symptoms.  He has no nasal symptom complaints today.   Assessment and plan: Alpha-gal hypersensitivity  Continue meticulous avoidance of nonprimate mammalian meat and have access to epinephrine autoinjector 2 pack in case of accidental ingestion.  As certain patients without gal hypersensitivity experience symptoms with the consumption of gelatin or dairy products, I have asked him to keep a careful symptom/exposure journal should he develop urticaria or pruritus again.   Emergency allergy action plan is in place.  We will re-check serum specific IgE against alpha gal panel in one year.  Perennial allergic rhinitis  Continue appropriate allergen avoidance measures, levocetirizine 5 mg daily as needed, and fluticasone nasal spray as needed.  A refill prescription has been provided for levocetirizine.    Meds ordered this encounter  Medications  . levocetirizine (XYZAL) 5 MG tablet    Sig: Take 2.5mg  twice a day for hives/itch    Dispense:  30 tablet    Refill:  5    Physical examination: Blood pressure 118/70, pulse 62, resp. rate 16, weight 203 lb (92.1 kg), SpO2 98 %.  General: Alert, interactive,  in no acute distress. HEENT: TMs pearly gray, turbinates minimally edematous without discharge, post-pharynx unremarkable. Neck: Supple without lymphadenopathy. Lungs: Clear to auscultation without wheezing, rhonchi or rales. CV: Normal S1, S2 without murmurs. Skin: Warm and dry, without lesions or rashes.  The following portions of the patient's history were reviewed and updated as appropriate: allergies, current medications, past family history, past medical history, past social history, past surgical history and problem list.  Allergies as of 10/15/2016      Reactions   Prednisone Other (See Comments)   im depo medrol caused dizziness - pt passed out. Ask pt before giving (pt has taken since the reaction)   Brilinta [ticagrelor] Hives   Doxycycline Hyclate Hives, Itching   Penicillins Hives   Has patient had a PCN reaction causing immediate rash, facial/tongue/throat swelling, SOB or lightheadedness with hypotension: NO Pt states has has taken PCN after Vibramycin and not had any problems. Has patient had a PCN reaction causing severe rash involving mucus membranes or skin necrosis: No Has patient had a PCN reaction that required hospitalization No Has patient had a PCN reaction occurring within the last 10 years: No If all of the above answers are "NO", then may proceed with Cephalospo   Shellfish Allergy Hives   Beef-derived Products Rash   Fentanyl Rash   Reaction to patch   Lambs Quarters Rash   Pork-derived Products Rash      Medication List       Accurate as of 10/15/16  1:24 PM. Always use your most recent med list.  acetaminophen 500 MG tablet Commonly known as:  TYLENOL Take 1,000 mg by mouth every 6 (six) hours as needed (pain).   amLODipine 5 MG tablet Commonly known as:  NORVASC TAKE 1 TABLET (5 MG TOTAL) BY MOUTH DAILY.   aspirin EC 81 MG tablet Take 81 mg by mouth at bedtime.   atorvastatin 80 MG tablet Commonly known as:  LIPITOR Take 1  tablet (80 mg total) by mouth daily.   clopidogrel 75 MG tablet Commonly known as:  PLAVIX Take 1 tablet (75 mg total) by mouth daily.   diphenhydrAMINE 25 MG tablet Commonly known as:  BENADRYL Take 50 mg by mouth every 6 (six) hours as needed for itching.   EPINEPHrine 0.3 mg/0.3 mL Soaj injection Commonly known as:  EPI-PEN Inject 0.3 mLs (0.3 mg total) into the muscle once.   esomeprazole 40 MG capsule Commonly known as:  NEXIUM Take 1 capsule (40 mg total) by mouth daily at 12 noon.   fluticasone 50 MCG/ACT nasal spray Commonly known as:  FLONASE Place 2 sprays into both nostrils daily.   hydrochlorothiazide 25 MG tablet Commonly known as:  HYDRODIURIL TAKE 1 TABLET (25 MG TOTAL) BY MOUTH DAILY.   levocetirizine 5 MG tablet Commonly known as:  XYZAL Take 2.5mg  twice a day for hives/itch   levothyroxine 100 MCG tablet Commonly known as:  SYNTHROID, LEVOTHROID TAKE 1 TABLET (100 MCG TOTAL) BY MOUTH DAILY.   Melatonin 10 MG Tabs Take 10 mg by mouth at bedtime.   metoprolol tartrate 25 MG tablet Commonly known as:  LOPRESSOR TAKE 1/2 TABLET (12.5 MG TOTAL) BY MOUTH 2 (TWO) TIMES DAILY.   nitroGLYCERIN 0.4 MG SL tablet Commonly known as:  NITROSTAT Place 1 tablet (0.4 mg total) under the tongue every 5 (five) minutes as needed for chest pain.       Allergies  Allergen Reactions  . Prednisone Other (See Comments)    im depo medrol caused dizziness - pt passed out. Ask pt before giving (pt has taken since the reaction)  . Brilinta [Ticagrelor] Hives  . Doxycycline Hyclate Hives and Itching  . Penicillins Hives    Has patient had a PCN reaction causing immediate rash, facial/tongue/throat swelling, SOB or lightheadedness with hypotension: NO Pt states has has taken PCN after Vibramycin and not had any problems. Has patient had a PCN reaction causing severe rash involving mucus membranes or skin necrosis: No Has patient had a PCN reaction that required  hospitalization No Has patient had a PCN reaction occurring within the last 10 years: No If all of the above answers are "NO", then may proceed with Cephalospo  . Shellfish Allergy Hives  . Beef-Derived Products Rash  . Fentanyl Rash    Reaction to patch  . Lambs Quarters Rash  . Pork-Derived Products Rash    I appreciate the opportunity to take part in Thoams's care. Please do not hesitate to contact me with questions.  Sincerely,   R. Edgar Frisk, MD

## 2016-10-15 NOTE — Assessment & Plan Note (Addendum)
   Continue meticulous avoidance of nonprimate mammalian meat and have access to epinephrine autoinjector 2 pack in case of accidental ingestion.  As certain patients without gal hypersensitivity experience symptoms with the consumption of gelatin or dairy products, I have asked him to keep a careful symptom/exposure journal should he develop urticaria or pruritus again.   Emergency allergy action plan is in place.  We will re-check serum specific IgE against alpha gal panel in one year.

## 2016-10-15 NOTE — Patient Instructions (Addendum)
Alpha-gal hypersensitivity  Continue meticulous avoidance of nonprimate mammalian meat and have access to epinephrine autoinjector 2 pack in case of accidental ingestion.  As certain patients without gal hypersensitivity experience symptoms with the consumption of gelatin or dairy products, I have asked him to keep a careful symptom/exposure journal should he develop urticaria or pruritus again.   Emergency allergy action plan is in place.  We will re-check serum specific IgE against alpha gal panel in one year.  Perennial allergic rhinitis  Continue appropriate allergen avoidance measures, levocetirizine 5 mg daily as needed, and fluticasone nasal spray as needed.  A refill prescription has been provided for levocetirizine.   Return in about 1 year (around 10/15/2017), or if symptoms worsen or fail to improve.

## 2016-10-15 NOTE — Assessment & Plan Note (Signed)
   Continue appropriate allergen avoidance measures, levocetirizine 5 mg daily as needed, and fluticasone nasal spray as needed.  A refill prescription has been provided for levocetirizine.

## 2016-10-23 NOTE — Progress Notes (Signed)
And white he will keep this.    Cardiology Office Note   Date:  10/25/2016   ID:  Jeffrey Hooper, DOB [birth year], MRN [id number]  PCP:  Jeffrey Maize, MD  Cardiologist:   Minus Breeding, MD   Chief Complaint  Patient presents with  . Palpitations      History of Present Illness: Jeffrey Hooper is a 56 y.o. male who presents initially for evaluation of recent PCI.  Recently he had chest pain and palpitations. Stress perfusion study which suggested a moderate sized defect in the inferior wall with ischemia. The EF was well-preserved.  The patient presented to [facility] on 12/02/15 for cath and was found to have single vessel obstructive CAD involving the mid LAD. He underwent successful PCI with a mid LAD DES.  He was placed on DAPT with ASA and Brilinta, high intensity statin, metoprolol and amlodipine.  Beta blocker was stopped in hospital because of bradycardia.   He had to come back to the hospital with tachycardia however thought to be related to the beta blocker withdrawal.  His beta blocker was restarted.  Also the patient seemed to have hives with Brilinta.  He was switched to Plavix.   At the last visit he was having some increased palpitations and an event monitor was ordered. I have reviewed some of these strips that he has isolated PVCs.  I don't see any sustained arrhythmias.  He has had lightheadedness associated with this.  He wonders if he might pass out but he is not really describing presyncope or syncope.  He has not ha symptoms that were similar to his previous angina.    His wife tells him that she thinks that he needs something for his anxiety.    Past Medical History:  Diagnosis Date  . Allergy   . Anginal pain ([place])   . Arthritis   . CAD (coronary artery disease), native coronary artery 12/03/2015  . Coronary artery disease   . Dysrhythmia    "irregular and PAC'S"  . GERD (gastroesophageal reflux disease)   . Headache   . Hyperlipidemia   . Hypertension   .  Hypothyroid   . Multiple benign nevi   . Neuromuscular disorder ([place]) 2011   LEFT RADIAL NERVE SURGERY R/T TRAUMA  . Pneumonia 2014, 2017  . Prostatism   . Pulmonary nodule   . Seasonal allergies     Past Surgical History:  Procedure Laterality Date  . BACK SURGERY     T12 - L1  . CARDIAC CATHETERIZATION N/A 12/02/2015   Procedure: Left Heart Cath and Coronary Angiography;  Surgeon: Peter M Martinique, MD;  Location: [facility];  Service: Cardiovascular;  Laterality: N/A;  . CARDIAC CATHETERIZATION N/A 12/02/2015   Procedure: Coronary Stent Intervention;  Surgeon: Peter M Martinique, MD;  Location: [facility];  Service: Cardiovascular;  Laterality: N/A;  mid LAD Promus 2.5x12  . CORONARY ANGIOPLASTY    . MASS EXCISION  06/25/2012   Procedure: EXCISION MASS;  Surgeon: Wynonia Sours, MD;  Location: [facility];  Service: Orthopedics;  Laterality: Left;  transection of NEUROMA, BURYING RADIAL NERVE IN BRACHIORADIALIS LEFT SIDE  . radial nerve    . SPINAL FUSION     C6-7  . TONSILLECTOMY  1982  . VASECTOMY       Current Outpatient Prescriptions  Medication Sig Dispense Refill  . acetaminophen (TYLENOL) 500 MG tablet Take 1,000 mg by mouth every 6 (six) hours as needed (  pain).     . aspirin EC 81 MG tablet Take 81 mg by mouth at bedtime.    . diphenhydrAMINE (BENADRYL) 25 MG tablet Take 50 mg by mouth every 6 (six) hours as needed for itching.    . fluticasone (FLONASE) 50 MCG/ACT nasal spray Place 2 sprays into both nostrils daily. 16 g 6  . levocetirizine (XYZAL) 5 MG tablet Take 2.5 mg by mouth 2 (two) times daily as needed for allergies.    . Melatonin 10 MG TABS Take 10 mg by mouth at bedtime.     . nitroGLYCERIN (NITROSTAT) 0.4 MG SL tablet Place 1 tablet (0.4 mg total) under the tongue every 5 (five) minutes as needed for chest pain. 50 tablet 0  . amLODipine (NORVASC) 5 MG tablet Take 1 tablet (5 mg total) by mouth daily. 90 tablet 1  . atorvastatin  (LIPITOR) 80 MG tablet Take 1 tablet (80 mg total) by mouth daily. 90 tablet 3  . clopidogrel (PLAVIX) 75 MG tablet Take 1 tablet (75 mg total) by mouth daily. 90 tablet 3  . DULoxetine (CYMBALTA) 20 MG capsule Take 1 capsule (20 mg total) by mouth daily. 90 capsule 1  . EPINEPHrine 0.3 mg/0.3 mL IJ SOAJ injection Inject 0.3 mLs (0.3 mg total) into the muscle once. 2 Device 1  . esomeprazole (NEXIUM) 40 MG capsule Take 1 capsule (40 mg total) by mouth daily. 90 capsule 1  . hydrochlorothiazide (HYDRODIURIL) 25 MG tablet Take 1 tablet (25 mg total) by mouth daily. 90 tablet 1  . levothyroxine (SYNTHROID, LEVOTHROID) 100 MCG tablet Take 1 tablet (100 mcg total) by mouth daily. 90 tablet 1  . metoprolol tartrate (LOPRESSOR) 25 MG tablet TAKE 1/2 TABLET (12.5 MG TOTAL) BY MOUTH 2 (TWO) TIMES DAILY. 90 tablet 3   No current facility-administered medications for this visit.     Allergies:   Prednisone; Brilinta [ticagrelor]; Doxycycline hyclate; Penicillins; Shellfish allergy; Beef-derived products; Fentanyl; Lambs quarters; and Pork-derived products    ROS:  Please see the history of present illness.   Otherwise, review of systems are positive for none.   All other systems are reviewed and negative.    PHYSICAL EXAM: VS:  BP 122/70   Pulse (!) 52   Ht 5' 10.5" (1.791 m)   Wt 209 lb (94.8 kg)   BMI 29.56 kg/m  , BMI Body mass index is 29.56 kg/m. GENERAL:  Well appearing NECK:  No jugular venous distention, waveform within normal limits, carotid upstroke brisk and symmetric, no bruits, no thyromegaly LUNGS:  Clear to auscultation bilaterally BACK:  No CVA tenderness CHEST:  Unremarkable HEART:  PMI not displaced or sustained,S1 and S2 within normal limits, no S3, no S4, no clicks, no rubs, no murmurs ABD:  Flat, positive bowel sounds normal in frequency in pitch, no bruits, no rebound, no guarding, no midline pulsatile mass, no hepatomegaly, no splenomegaly EXT:  2 plus pulses throughout,  no edema, no cyanosis no clubbing   EKG:  EKG  is ordered today. Sinus rhythm, rate 45 , axis within normal limits, intervals within normal limits, no acute ST-T wave changes.  Recent Labs: 11/09/2015: Magnesium 2.3 04/03/2016: Hemoglobin 15.3 09/12/2016: ALT 32; BUN 12; Creatinine, Ser 1.04; Platelets 207; Potassium 4.0; Sodium 144; TSH 3.270    Lipid Panel    Component Value Date/Time   CHOL 129 09/12/2016 1324   CHOL 136 10/29/2012 0922   TRIG 119 09/12/2016 1324   TRIG 139 04/28/2013 1505   TRIG 103  10/29/2012 0922   HDL 47 09/12/2016 1324   HDL 44 04/28/2013 1505   HDL 41 10/29/2012 0922   CHOLHDL 2.7 09/12/2016 1324   LDLCALC 58 09/12/2016 1324   LDLCALC 50 04/28/2013 1505   LDLCALC 74 10/29/2012 0922      Wt Readings from Last 3 Encounters:  10/25/16 208 lb (94.3 kg)  10/24/16 209 lb (94.8 kg)  10/15/16 203 lb (92.1 kg)      Other studies Reviewed: Additional studies/ records that were reviewed today include: None Review of the above records demonstrates:    ASSESSMENT AND PLAN:  CAD S/P percutaneous coronary angioplasty I don't suspect new obstructive coronary disease as the dyspnea was his angina and this is gone. I am going to evaluate other symptoms as below however.  Essential hypertension The blood pressure is at target. No change in medications is indicated. We will continue with therapeutic lifestyle changes (TLC).  Palpitations He has rare PVCs.  This is probably what he is feeling compounded by anxiety.  I have suggested that he purchase an [employer] device to record his rhythm.    Dyslipidemia On high dose statin Rx. He wanted to reduce the dose I think his last level was excellent so I'll make no changes.    Renal insufficiency His creatinine last checked was 1.04.  I checked this at the last appt.      Anxiety This does contribute somewhat to his symptom. This can continue to be managed by Jeffrey Maize, MD .  I have requested that he  see her to discuss management of this.     Current medicines are reviewed at length with the patient today.  The patient does not have concerns regarding medicines.  The following changes have been made:  None  Labs/ tests ordered today include:    None  No orders of the defined types were placed in this encounter.    Disposition:   FU with me in 6 months.    Signed, Minus Breeding, MD  10/25/2016 5:36 PM    [facility]

## 2016-10-24 ENCOUNTER — Encounter: Payer: Self-pay | Admitting: Cardiology

## 2016-10-24 ENCOUNTER — Ambulatory Visit (INDEPENDENT_AMBULATORY_CARE_PROVIDER_SITE_OTHER): Payer: BLUE CROSS/BLUE SHIELD | Admitting: Cardiology

## 2016-10-24 VITALS — BP 122/70 | HR 52 | Ht 70.5 in | Wt 209.0 lb

## 2016-10-24 DIAGNOSIS — R002 Palpitations: Secondary | ICD-10-CM | POA: Diagnosis not present

## 2016-10-24 DIAGNOSIS — F419 Anxiety disorder, unspecified: Secondary | ICD-10-CM | POA: Diagnosis not present

## 2016-10-24 DIAGNOSIS — Z955 Presence of coronary angioplasty implant and graft: Secondary | ICD-10-CM | POA: Diagnosis not present

## 2016-10-24 DIAGNOSIS — E785 Hyperlipidemia, unspecified: Secondary | ICD-10-CM

## 2016-10-24 DIAGNOSIS — N189 Chronic kidney disease, unspecified: Secondary | ICD-10-CM

## 2016-10-24 NOTE — Patient Instructions (Signed)
Medication Instructions:  The current medical regimen is effective;  continue present plan and medications.  Follow-Up: Follow up in 6 months with Dr. [name].  You will receive a letter in the mail 2 months before you are due.  Please call us when you receive this letter to schedule your follow up appointment.  If you need a refill on your cardiac medications before your next appointment, please call your pharmacy.  Thank you for choosing [facility]!!    [employer]

## 2016-10-25 ENCOUNTER — Encounter: Payer: Self-pay | Admitting: Pediatrics

## 2016-10-25 ENCOUNTER — Encounter: Payer: Self-pay | Admitting: Cardiology

## 2016-10-25 ENCOUNTER — Ambulatory Visit (INDEPENDENT_AMBULATORY_CARE_PROVIDER_SITE_OTHER): Payer: BLUE CROSS/BLUE SHIELD | Admitting: Pediatrics

## 2016-10-25 VITALS — BP 114/66 | HR 48 | Temp 97.2°F | Ht 70.5 in | Wt 208.0 lb

## 2016-10-25 DIAGNOSIS — Z9861 Coronary angioplasty status: Secondary | ICD-10-CM

## 2016-10-25 DIAGNOSIS — R002 Palpitations: Secondary | ICD-10-CM

## 2016-10-25 DIAGNOSIS — E785 Hyperlipidemia, unspecified: Secondary | ICD-10-CM

## 2016-10-25 DIAGNOSIS — E039 Hypothyroidism, unspecified: Secondary | ICD-10-CM

## 2016-10-25 DIAGNOSIS — F419 Anxiety disorder, unspecified: Secondary | ICD-10-CM | POA: Diagnosis not present

## 2016-10-25 DIAGNOSIS — I10 Essential (primary) hypertension: Secondary | ICD-10-CM

## 2016-10-25 DIAGNOSIS — I2510 Atherosclerotic heart disease of native coronary artery without angina pectoris: Secondary | ICD-10-CM | POA: Diagnosis not present

## 2016-10-25 MED ORDER — AMLODIPINE BESYLATE 5 MG PO TABS: 5.0000 mg | ORAL_TABLET | Freq: Every day | ORAL | 1 refills | Status: DC

## 2016-10-25 MED ORDER — CLOPIDOGREL BISULFATE 75 MG PO TABS: 75.0000 mg | ORAL_TABLET | Freq: Every day | ORAL | 3 refills | Status: DC

## 2016-10-25 MED ORDER — METOPROLOL TARTRATE 25 MG PO TABS: ORAL_TABLET | ORAL | 3 refills | Status: DC

## 2016-10-25 MED ORDER — ESOMEPRAZOLE MAGNESIUM 40 MG PO CPDR: 40.0000 mg | DELAYED_RELEASE_CAPSULE | Freq: Every day | ORAL | 1 refills | Status: DC

## 2016-10-25 MED ORDER — LEVOTHYROXINE SODIUM 100 MCG PO TABS: 100.0000 ug | ORAL_TABLET | Freq: Every day | ORAL | 1 refills | Status: DC

## 2016-10-25 MED ORDER — ATORVASTATIN CALCIUM 80 MG PO TABS: 80.0000 mg | ORAL_TABLET | Freq: Every day | ORAL | 3 refills | Status: DC

## 2016-10-25 MED ORDER — DULOXETINE HCL 20 MG PO CPEP: 20.0000 mg | ORAL_CAPSULE | Freq: Every day | ORAL | 1 refills | Status: DC

## 2016-10-25 MED ORDER — HYDROCHLOROTHIAZIDE 25 MG PO TABS: 25.0000 mg | ORAL_TABLET | Freq: Every day | ORAL | 1 refills | Status: DC

## 2016-10-25 NOTE — Progress Notes (Signed)
Subjective:   Patient ID: Jeffrey Hooper, male    DOB: 1961-07-21, 56 y.o.   MRN: [id number] CC: anxiety  HPI: Jeffrey Hooper is a 56 y.o. male presenting for anxiety Wife has noticed that he is more irritable, has a faster temper Has been going on for some months 56yo at home with ADHD increases stress Says his mood has been fine  Light sleeper Never been on any medication for sleep before Goes to bed around 10pm, ringing in ears bothers him some Has been seen by ENT, tried on medications, not improved much  CAD: seen by cardiology yesterday No CP, SOB now Having occasional skipped beats Proposed may be due to untreated anxiety  GERD: takes PPI daily No symptoms recently Open to trying to discontinue  Hypothyroidism: taking med daily Recent TSH wnl  HLD: on atorvastatin, taking daily  GAD 7 : Generalized Anxiety Score 10/25/2016  Nervous, Anxious, on Edge 3  Control/stop worrying 3  Worry too much - different things 3  Trouble relaxing 3  Restless 1  Easily annoyed or irritable 3  Afraid - awful might happen 2  Total GAD 7 Score 18  Anxiety Difficulty Very difficult    Depression screen [facility] 2/9 10/25/2016 07/26/2016 04/13/2016 02/17/2016 01/03/2016  Decreased Interest 0 0 0 0 0  Down, Depressed, Hopeless 1 0 0 0 0  PHQ - 2 Score 1 0 0 0 0  Altered sleeping - - 0 - 1  Tired, decreased energy - - 0 - 0  Change in appetite - - 0 - 0  Feeling bad or failure about yourself  - - 0 - 0  Trouble concentrating - - 0 - 0  Moving slowly or fidgety/restless - - 0 - 0  Suicidal thoughts - - 0 - 0  PHQ-9 Score - - 0 - 1  Difficult doing work/chores - - - - Not difficult at all    Relevant past medical, surgical, family and social history reviewed. Allergies and medications reviewed and updated. History  Smoking Status  . Former Smoker  . Packs/day: 1.00  . Years: 20.00  . Types: Cigarettes  . Quit date: 11/28/2006  Smokeless Tobacco  . Never Used   ROS: Per HPI    Objective:    BP 114/66   Pulse (!) 48   Temp 97.2 F (36.2 C) (Oral)   Ht 5' 10.5" (1.791 m)   Wt 208 lb (94.3 kg)   BMI 29.42 kg/m   Wt Readings from Last 3 Encounters:  10/25/16 208 lb (94.3 kg)  10/24/16 209 lb (94.8 kg)  10/15/16 203 lb (92.1 kg)    Gen: NAD, alert, cooperative with exam, NCAT EYES: EOMI, no conjunctival injection, or no icterus ENT:  OP without erythema LYMPH: no cervical LAD CV: NRRR, normal S1/S2, no murmur, distal pulses 2+ b/l Resp: CTABL, no wheezes, normal WOB Abd: +BS, soft, NTND. no guarding or organomegaly Ext: No edema, warm Neuro: Alert and oriented, strength equal b/l UE and LE, coordination grossly normal MSK: normal muscle bulk  Assessment & Plan:  Diagnoses and all orders for this visit:  Anxiety Never been on treatment before Will start below, minimal sexual side effects, no major interactions with other medications RTC in 8 weeks, let m eknow if no improvement in symptoms will increase dose -     DULoxetine (CYMBALTA) 20 MG capsule; Take 1 capsule (20 mg total) by mouth daily.  Essential hypertension Well controlled today Needs refills on medications, cont  below -     amLODipine (NORVASC) 5 MG tablet; Take 1 tablet (5 mg total) by mouth daily. -     hydrochlorothiazide (HYDRODIURIL) 25 MG tablet; Take 1 tablet (25 mg total) by mouth daily.  CAD S/P percutaneous coronary angioplasty Followed by cardiology, cont below -     clopidogrel (PLAVIX) 75 MG tablet; Take 1 tablet (75 mg total) by mouth daily. -     metoprolol tartrate (LOPRESSOR) 25 MG tablet; TAKE 1/2 TABLET (12.5 MG TOTAL) BY MOUTH 2 (TWO) TIMES DAILY.  Palpitations Thought anxiety may be contributing, will treat as above  Hypothyroidism, unspecified type TSH nl 1 mo ago, cont below -     levothyroxine (SYNTHROID, LEVOTHROID) 100 MCG tablet; Take 1 tablet (100 mcg total) by mouth daily.  Hyperlipidemia, unspecified hyperlipidemia type Stable, cont below -      atorvastatin (LIPITOR) 80 MG tablet; Take 1 tablet (80 mg total) by mouth daily.   Follow up plan: Return in about 2 months (around 12/25/2016). Assunta Found, MD [place]

## 2016-10-26 ENCOUNTER — Other Ambulatory Visit: Payer: Self-pay | Admitting: Cardiology

## 2016-10-26 DIAGNOSIS — I10 Essential (primary) hypertension: Secondary | ICD-10-CM

## 2016-11-07 ENCOUNTER — Other Ambulatory Visit: Payer: Self-pay | Admitting: Pediatrics

## 2016-11-07 DIAGNOSIS — I10 Essential (primary) hypertension: Secondary | ICD-10-CM

## 2016-11-07 DIAGNOSIS — E039 Hypothyroidism, unspecified: Secondary | ICD-10-CM

## 2016-11-30 ENCOUNTER — Telehealth: Payer: Self-pay | Admitting: Pediatrics

## 2016-11-30 DIAGNOSIS — K219 Gastro-esophageal reflux disease without esophagitis: Secondary | ICD-10-CM

## 2016-11-30 MED ORDER — PANTOPRAZOLE SODIUM 40 MG PO TBEC: 40.0000 mg | DELAYED_RELEASE_TABLET | Freq: Every day | ORAL | 1 refills | Status: DC

## 2016-11-30 NOTE — Telephone Encounter (Signed)
Patient aware and verbalizes understanding. 

## 2016-11-30 NOTE — Telephone Encounter (Signed)
Need to change nexium to Protonix due to interaction.  ?? Please address these rf - or have nurse call [facility]

## 2016-11-30 NOTE — Telephone Encounter (Signed)
I sent in protinix as requested. We talked about trying to take it every other day and trying to get off of this kind of medicine if he can. Can also take OTC pepcid/famotidine instead if he still needs something, has fewer side effects than the protonix/nexium.

## 2016-12-26 ENCOUNTER — Ambulatory Visit (INDEPENDENT_AMBULATORY_CARE_PROVIDER_SITE_OTHER): Payer: BLUE CROSS/BLUE SHIELD | Admitting: Pediatrics

## 2016-12-26 ENCOUNTER — Encounter: Payer: Self-pay | Admitting: Pediatrics

## 2016-12-26 VITALS — BP 124/80 | HR 63 | Temp 98.1°F | Ht 70.5 in | Wt 203.6 lb

## 2016-12-26 DIAGNOSIS — I2510 Atherosclerotic heart disease of native coronary artery without angina pectoris: Secondary | ICD-10-CM | POA: Diagnosis not present

## 2016-12-26 DIAGNOSIS — Z9861 Coronary angioplasty status: Secondary | ICD-10-CM | POA: Diagnosis not present

## 2016-12-26 DIAGNOSIS — F419 Anxiety disorder, unspecified: Secondary | ICD-10-CM | POA: Diagnosis not present

## 2016-12-26 DIAGNOSIS — H9313 Tinnitus, bilateral: Secondary | ICD-10-CM

## 2016-12-26 MED ORDER — DULOXETINE HCL 60 MG PO CPEP: 60.0000 mg | ORAL_CAPSULE | Freq: Every day | ORAL | 3 refills | Status: DC

## 2016-12-26 MED ORDER — NITROGLYCERIN 0.4 MG SL SUBL
0.4000 mg | SUBLINGUAL_TABLET | SUBLINGUAL | 2 refills | Status: DC | PRN
Start: 2016-12-26 — End: 2018-06-04

## 2016-12-26 NOTE — Progress Notes (Signed)
Subjective:   Patient ID: [name], male    DOB: 1960-11-07, 56 y.o.   MRN: [id number] CC: Follow-up (29month, Anxiety) and Tinnitus  HPI: Jeffrey Hooper is a 56 y.o. male presenting for Follow-up (45month, Anxiety) and Tinnitus  Continues to have tinnitus Ongoing for years  Has been told he has TM effusions multiple times Ringing is getting more and more high pitched over the last few months, bothering him more Keeping him awake at night Taking flonase every day, levocetirizine daily Follows with allergy for meat allergy  Was seen by ENT in 2014  Diagnosed with asymmetric hearing loss, started on melatonin Had had a recent neg CT scan, supposed to f/u in 12 mo for repeat hearing test  No chest pain with exertion recently Keeps nitroglycerin with him given h/o CAD, now out of date  GAD 7 : Generalized Anxiety Score 12/26/2016 10/25/2016  Nervous, Anxious, on Edge 3 3  Control/stop worrying 3 3  Worry too much - different things 3 3  Trouble relaxing 3 3  Restless 1 1  Easily annoyed or irritable 3 3  Afraid - awful might happen 2 2  Total GAD 7 Score 18 18  Anxiety Difficulty Very difficult Very difficult    Depression screen [facility] 2/9 12/26/2016 10/25/2016 07/26/2016 04/13/2016 02/17/2016  Decreased Interest 0 0 0 0 0  Down, Depressed, Hopeless 1 1 0 0 0  PHQ - 2 Score 1 1 0 0 0  Altered sleeping - - - 0 -  Tired, decreased energy - - - 0 -  Change in appetite - - - 0 -  Feeling bad or failure about yourself  - - - 0 -  Trouble concentrating - - - 0 -  Moving slowly or fidgety/restless - - - 0 -  Suicidal thoughts - - - 0 -  PHQ-9 Score - - - 0 -  Difficult doing work/chores - - - - -     Relevant past medical, surgical, family and social history reviewed. Allergies and medications reviewed and updated. History  Smoking Status  . Former Smoker  . Packs/day: 1.00  . Years: 20.00  . Types: Cigarettes  . Quit date: 11/28/2006  Smokeless Tobacco  . Never Used    ROS: Per HPI   Objective:    BP 124/80   Pulse 63   Temp 98.1 F (36.7 C) (Oral)   Ht 5' 10.5" (1.791 m)   Wt 203 lb 9.6 oz (92.4 kg)   BMI 28.80 kg/m   Wt Readings from Last 3 Encounters:  12/26/16 203 lb 9.6 oz (92.4 kg)  10/25/16 208 lb (94.3 kg)  10/24/16 209 lb (94.8 kg)    Gen: NAD, alert, cooperative with exam, NCAT EYES: EOMI, no conjunctival injection, or no icterus ENT:  TMs with clear-white fluid with air bubbles b/l, OP without erythema LYMPH: no cervical LAD CV: NRRR, normal S1/S2, no murmur Resp: CTABL, no wheezes, normal WOB Abd: +BS, soft, NTND. no guarding or organomegaly Ext: No edema, warm Neuro: Alert and oriented, strength equal b/l UE and LE, coordination grossly normal MSK: normal muscle bulk  Assessment & Plan:  Jeffrey Hooper was seen today for follow-up and tinnitus.  Diagnoses and all orders for this visit:  Anxiety Ongoing symptoms, he thinks due to tinnitis Feels safe at home, no thoughts of self harm Will increase to below -     DULoxetine (CYMBALTA) 60 MG capsule; Take 1 capsule (60 mg total) by mouth daily.  CAD  S/P percutaneous coronary angioplasty Stable, asymptomatic, NTG ou of date, will send refills On ASA, statin, plavix, BB -     nitroGLYCERIN (NITROSTAT) 0.4 MG SL tablet; Place 1 tablet (0.4 mg total) under the tongue every 5 (five) minutes as needed for chest pain.  Tinnitus of both ears Discussed relaxation techniques, stress relief Treat anxiety as above -     Ambulatory referral to ENT   Follow up plan: 2 mo Assunta Found, MD [place]

## 2017-01-23 ENCOUNTER — Encounter: Payer: Self-pay | Admitting: Pediatrics

## 2017-01-23 ENCOUNTER — Ambulatory Visit (INDEPENDENT_AMBULATORY_CARE_PROVIDER_SITE_OTHER): Payer: BLUE CROSS/BLUE SHIELD | Admitting: Pediatrics

## 2017-01-23 VITALS — BP 120/79 | HR 56 | Temp 97.0°F | Ht 70.5 in | Wt 198.0 lb

## 2017-01-23 DIAGNOSIS — H9313 Tinnitus, bilateral: Secondary | ICD-10-CM | POA: Diagnosis not present

## 2017-01-23 DIAGNOSIS — G4700 Insomnia, unspecified: Secondary | ICD-10-CM | POA: Diagnosis not present

## 2017-01-23 MED ORDER — SUVOREXANT 10 MG PO TABS: 10.0000 mg | ORAL_TABLET | Freq: Every day | ORAL | 1 refills | Status: DC

## 2017-01-23 NOTE — Progress Notes (Signed)
  Subjective:   Patient ID: Jeffrey Hooper, male    DOB: 05-18-1961, 56 y.o.   MRN: [id number] CC: Follow-up (Anxiety)  HPI: Jeffrey Hooper is a 56 y.o. male presenting for Follow-up (Anxiety)  Every night wakes up 1-2am with ringing in his ears, lays there until the morning Both ears seem to be equally affected Dogs barking, loud noises, higher pitched voices he says makes the ring a lot worse  Has a 56yo at home, has ADHD, noisy at times  Has appt with ENT next week for tinnitus  Feels anxious much of the time, on cymbalta 60mg  Not sure if helping Thinks the tinnitus is driving much of anxiety  Relevant past medical, surgical, family and social history reviewed. Allergies and medications reviewed and updated. History  Smoking Status  . Former Smoker  . Packs/day: 1.00  . Years: 20.00  . Types: Cigarettes  . Quit date: 11/28/2006  Smokeless Tobacco  . Never Used   ROS: Per HPI   Objective:    BP 120/79   Pulse (!) 56   Temp 97 F (36.1 C) (Oral)   Ht 5' 10.5" (1.791 m)   Wt 198 lb (89.8 kg)   BMI 28.01 kg/m   Wt Readings from Last 3 Encounters:  01/23/17 198 lb (89.8 kg)  12/26/16 203 lb 9.6 oz (92.4 kg)  10/25/16 208 lb (94.3 kg)     Gen: NAD, alert, cooperative with exam, NCAT EYES: EOMI, no conjunctival injection, or no icterus ENT:  TMs with gray-white effusion b/l, no erythema, OP without erythema LYMPH: no cervical LAD CV: NRRR, normal S1/S2, no murmur, distal pulses 2+ b/l Resp: CTABL, no wheezes, normal WOB Abd: +BS, soft, NTND. no guarding or organomegaly Ext: No edema, warm Neuro: Alert and oriented  Assessment & Plan:  Daimian was seen today for follow-up ringing in ears  Diagnoses and all orders for this visit:  Insomnia, unspecified type Trial of below, take 10-20mg  qhs -     Suvorexant (BELSOMRA) 10 MG TABS; Take 10-20 mg by mouth at bedtime.  Tinnitus of both ears appt upcoming with ENT  Anxiety Cont cymbalta Tinnitus tx as  above  Follow up plan: Return in about 4 weeks (around 02/20/2017) for 15 min OV med prob. Assunta Found, MD [place]

## 2017-01-28 DIAGNOSIS — H9313 Tinnitus, bilateral: Secondary | ICD-10-CM | POA: Diagnosis not present

## 2017-01-28 DIAGNOSIS — H903 Sensorineural hearing loss, bilateral: Secondary | ICD-10-CM | POA: Diagnosis not present

## 2017-02-03 ENCOUNTER — Other Ambulatory Visit: Payer: Self-pay | Admitting: Pediatrics

## 2017-02-03 DIAGNOSIS — K219 Gastro-esophageal reflux disease without esophagitis: Secondary | ICD-10-CM

## 2017-02-25 ENCOUNTER — Encounter: Payer: Self-pay | Admitting: Pediatrics

## 2017-02-25 ENCOUNTER — Ambulatory Visit (INDEPENDENT_AMBULATORY_CARE_PROVIDER_SITE_OTHER): Payer: BLUE CROSS/BLUE SHIELD | Admitting: Pediatrics

## 2017-02-25 VITALS — BP 127/75 | HR 57 | Temp 97.1°F | Ht 70.5 in | Wt 202.8 lb

## 2017-02-25 DIAGNOSIS — H9313 Tinnitus, bilateral: Secondary | ICD-10-CM

## 2017-02-25 DIAGNOSIS — M5442 Lumbago with sciatica, left side: Secondary | ICD-10-CM

## 2017-02-25 DIAGNOSIS — F419 Anxiety disorder, unspecified: Secondary | ICD-10-CM

## 2017-02-25 DIAGNOSIS — G8929 Other chronic pain: Secondary | ICD-10-CM | POA: Diagnosis not present

## 2017-02-25 MED ORDER — PREDNISONE 10 MG (21) PO TBPK: ORAL_TABLET | Freq: Every day | ORAL | 0 refills | Status: DC

## 2017-02-25 MED ORDER — DIAZEPAM 2 MG PO TABS: 2.0000 mg | ORAL_TABLET | Freq: Two times a day (BID) | ORAL | 1 refills | Status: DC | PRN

## 2017-02-25 NOTE — Progress Notes (Signed)
Subjective:   Patient ID: Jeffrey Hooper, male    DOB: 08-Feb-1961, 56 y.o.   MRN: [id number] CC: Follow-up (Anxiety)  HPI: Jeffrey Hooper is a 56 y.o. male presenting for Follow-up (Anxiety)  Seen by ENT for ringing in his ears Told nothing that could be done Probably needs hearing aids, may or may not help ringing Continues to have ringing constantly, L ear worse than R ear Continues to feel very uncomfortable with ringing, hard time sleeping at night  Anxiety: thinks cymbalta may be helping some, but is not sure remains irritable at home but thinks tinnitus may be contributing  Also with low back pain, has regular pain shooting down L leg Has h/o back surgery, neck fusion Worse in the last 2-3 weeks No trouble emptying bladder Follows regularly with back specialist, no plans for surgery  GAD 7 : Generalized Anxiety Score 02/25/2017 01/23/2017 12/26/2016 10/25/2016  Nervous, Anxious, on Edge 2 1 3 3   Control/stop worrying 2 3 3 3   Worry too much - different things 1 2 3 3   Trouble relaxing 3 3 3 3   Restless 1 1 1 1   Easily annoyed or irritable 3 3 3 3   Afraid - awful might happen 0 2 2 2   Total GAD 7 Score 12 15 18 18   Anxiety Difficulty Somewhat difficult Somewhat difficult Very difficult Very difficult    Relevant past medical, surgical, family and social history reviewed. Allergies and medications reviewed and updated. History  Smoking Status  . Former Smoker  . Packs/day: 1.00  . Years: 20.00  . Types: Cigarettes  . Quit date: 11/28/2006  Smokeless Tobacco  . Never Used   ROS: Per HPI   Objective:    BP 127/75   Pulse (!) 57   Temp (!) 97.1 F (36.2 C) (Oral)   Ht 5' 10.5" (1.791 m)   Wt 202 lb 12.8 oz (92 kg)   BMI 28.69 kg/m   Wt Readings from Last 3 Encounters:  02/25/17 202 lb 12.8 oz (92 kg)  01/23/17 198 lb (89.8 kg)  12/26/16 203 lb 9.6 oz (92.4 kg)    Gen: NAD, alert, cooperative with exam, NCAT EYES: EOMI, no conjunctival injection, or no  icterus ENT:  TMs gray with white-clear effusion b/l, OP without erythema LYMPH: no cervical LAD CV: NRRR, normal S1/S2, no murmur Resp: CTABL, no wheezes, normal WOB Abd: +BS, soft, NTND. no guarding or organomegaly Ext: No edema, warm Neuro: Alert and oriented Psych: normal affect, normal speech patterns, no thoughts of self harm  Assessment & Plan:  [name] was seen today for follow-up.  Diagnoses and all orders for this visit:  Tinnitus of both ears Pt not sure when he will get hearing aids due to cost, is open to trying in future Continues to feel "like he is crawling out of his skin" with the ringing at times, not all the time Worse at night Will do trial of long-acting benzo to take when needed for tinnitus Gave #60 tabs with 2 refills, must be seen for refills -     diazepam (VALIUM) 2 MG tablet; Take 1 tablet (2 mg total) by mouth every 12 (twelve) hours as needed for anxiety.  Chronic midline low back pain with left-sided sciatica Cont f/u with back specialist Worse past few weeks, will do trial of prednisone, says he has done fine with prednisone in the past No red flag symptoms -     predniSONE (STERAPRED UNI-PAK 21 TAB) 10 MG (21)  TBPK tablet; Take by mouth daily. As directed x 6 days  Anxiety Tinnitus likely contributing, treating as above Cont cymbalta for now  Follow up plan: Return in about 3 months (around 05/28/2017). Assunta Found, MD [place]

## 2017-03-02 ENCOUNTER — Other Ambulatory Visit: Payer: Self-pay | Admitting: Allergy and Immunology

## 2017-03-02 ENCOUNTER — Other Ambulatory Visit: Payer: Self-pay | Admitting: Pediatrics

## 2017-03-02 DIAGNOSIS — T7800XD Anaphylactic reaction due to unspecified food, subsequent encounter: Secondary | ICD-10-CM

## 2017-03-02 DIAGNOSIS — J3089 Other allergic rhinitis: Secondary | ICD-10-CM

## 2017-03-06 ENCOUNTER — Ambulatory Visit (INDEPENDENT_AMBULATORY_CARE_PROVIDER_SITE_OTHER): Payer: BLUE CROSS/BLUE SHIELD | Admitting: Family Medicine

## 2017-03-06 ENCOUNTER — Encounter: Payer: Self-pay | Admitting: Family Medicine

## 2017-03-06 VITALS — BP 127/81 | HR 57 | Temp 97.0°F | Ht 70.0 in | Wt 200.0 lb

## 2017-03-06 DIAGNOSIS — M545 Low back pain: Secondary | ICD-10-CM | POA: Diagnosis not present

## 2017-03-06 DIAGNOSIS — G8929 Other chronic pain: Secondary | ICD-10-CM

## 2017-03-06 MED ORDER — HYDROCODONE-ACETAMINOPHEN 5-325 MG PO TABS: 1.0000 | ORAL_TABLET | Freq: Four times a day (QID) | ORAL | 0 refills | Status: DC | PRN

## 2017-03-06 NOTE — Addendum Note (Signed)
Addended by: Rolena Infante on: 03/06/2017 11:08 AM   Modules accepted: Orders
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
## 2017-03-08 ENCOUNTER — Encounter: Payer: Self-pay | Admitting: Physical Therapy

## 2017-03-08 ENCOUNTER — Ambulatory Visit: Payer: BLUE CROSS/BLUE SHIELD | Attending: Family Medicine | Admitting: Physical Therapy

## 2017-03-08 DIAGNOSIS — G8929 Other chronic pain: Secondary | ICD-10-CM | POA: Insufficient documentation

## 2017-03-08 DIAGNOSIS — M545 Low back pain: Secondary | ICD-10-CM | POA: Diagnosis not present

## 2017-03-08 DIAGNOSIS — R293 Abnormal posture: Secondary | ICD-10-CM

## 2017-03-08 NOTE — Patient Instructions (Signed)
Discussed the possibility of patient receiving dry needling.

## 2017-03-08 NOTE — Therapy (Signed)
[facility] [place], [place], [address] Phone: [phone]   Fax:  [phone]  Physical Therapy Evaluation  Patient Details  Name: KAYE MITRO MRN: [id number] Date of Birth: 11-16-1960 Referring Provider: Alain Honey MD  Encounter Date: 03/08/2017      PT End of Session - 03/08/17 1252    Visit Number 1   Number of Visits 12   Date for PT Re-Evaluation 04/19/17   PT Start Time 1030   PT Stop Time 1117   PT Time Calculation (min) 47 min   Activity Tolerance Patient tolerated treatment well   Behavior During Therapy [facility] for tasks assessed/performed      Past Medical History:  Diagnosis Date  . Allergy   . Anginal pain ([place])   . Arthritis   . CAD (coronary artery disease), native coronary artery 12/03/2015  . Coronary artery disease   . Dysrhythmia    "irregular and PAC'S"  . GERD (gastroesophageal reflux disease)   . Headache   . Hyperlipidemia   . Hypertension   . Hypothyroid   . Multiple benign nevi   . Neuromuscular disorder ([place]) 2011   LEFT RADIAL NERVE SURGERY R/T TRAUMA  . Pneumonia 2014, 2017  . Prostatism   . Pulmonary nodule   . Seasonal allergies     Past Surgical History:  Procedure Laterality Date  . BACK SURGERY     T12 - L1  . CARDIAC CATHETERIZATION N/A 12/02/2015   Procedure: Left Heart Cath and Coronary Angiography;  Surgeon: Peter M Martinique, MD;  Location: [place] CV LAB;  Service: Cardiovascular;  Laterality: N/A;  . CARDIAC CATHETERIZATION N/A 12/02/2015   Procedure: Coronary Stent Intervention;  Surgeon: Peter M Martinique, MD;  Location: [place] CV LAB;  Service: Cardiovascular;  Laterality: N/A;  mid LAD Promus 2.5x12  . CORONARY ANGIOPLASTY    . MASS EXCISION  06/25/2012   Procedure: EXCISION MASS;  Surgeon: Wynonia Sours, MD;  Location: [place];  Service: Orthopedics;  Laterality: Left;  transection of NEUROMA, BURYING RADIAL NERVE IN BRACHIORADIALIS  LEFT SIDE  . radial nerve    . SPINAL FUSION     C6-7  . TONSILLECTOMY  1982  . VASECTOMY      There were no vitals filed for this visit.       Subjective Assessment - 03/08/17 1254    Subjective The patient presents to OPPT with c/o severe left sided low back pain with some c/o pain into his left LE.  He reports chronic pain over many years dating back to an [place] in 1986.  However, she pain has become very intense over the last three months.  His pain is rated at an 8/10 today with pain increasing further with bending and walking up/down hills (job requirement).  He has found nothing really decreases his pain.   Pertinent History Previous lumbar and cervical surgeries.   Limitations Sitting;Standing   How long can you sit comfortably? 10 minutes.   How long can you stand comfortably? 10 minutes.   How long can you walk comfortably? short distances.   Patient Stated Goals i want to get out of pain.   Currently in Pain? Yes   Pain Score 8    Pain Location Back   Pain Orientation Left   Pain Descriptors / Indicators Aching;Throbbing;Spasm   Pain Type Chronic pain   Pain Onset More than a month ago   Pain Frequency Constant   Aggravating Factors  See above.   Pain Relieving Factors See above.            [facility] PT Assessment - 03/08/17 0001      Assessment   Medical Diagnosis Chronic left sided low back pain.   Referring Provider Alain Honey MD   Onset Date/Surgical Date --  Flare-up 3 months ago.     Precautions   Precautions None     Restrictions   Weight Bearing Restrictions No     Balance Screen   Has the patient fallen in the past 6 months No   Has the patient had a decrease in activity level because of a fear of falling?  No   Is the patient reluctant to leave their home because of a fear of falling?  No     Home Environment   Living Environment Private residence     Prior Function   Level of Independence Independent     Posture/Postural Control    Posture/Postural Control Postural limitations   Postural Limitations Rounded Shoulders;Forward head;Decreased lumbar lordosis;Flexed trunk   Posture Comments left lateral lean.     ROM / Strength   AROM / PROM / Strength AROM;Strength     AROM   Overall AROM Comments lumbar flexion limites to 25% and extension to neutral.  Bilateral hip flexion in supine= 110 degrees.     Strength   Overall Strength Comments Left ankle strength and left knee extension= 4-/5.  Left hip flexion= 3+ to 4-/5.  Strength losses in part may be due to high pain-level.     Palpation   Palpation comment Very tender and taut to palpation over left lumbar erector spinae musculature and to a lesser degree on right.     Special Tests    Special Tests Lumbar;Sacrolliac Tests;Leg LengthTest  Normal bilateral LE DTR's.   Lumbar Tests --  C/o pain with left SLR and strecthing on right.   Sacroiliac Tests  --  (-) FABER testing.     Transfers   Comments Very slow and painful when performing transitory movements.     Ambulation/Gait   Gait Comments The patient walks in some spinal flexion and with a left lateral lean.            Objective measurements completed on examination: See above findings.          [facility] Adult PT Treatment/Exercise - 03/08/17 0001      Modalities   Modalities Electrical Stimulation     Electrical Stimulation   Electrical Stimulation Location Lower lumbar.   Electrical Stimulation Action IFC   Electrical Stimulation Parameters Constant at 80-150 Hz x 15 minutes.   Electrical Stimulation Goals Tone;Pain                     PT Long Term Goals - 03/08/17 1326      PT LONG TERM GOAL #1   Title Independent with a HEP.   Time 6   Period Weeks   Status New     PT LONG TERM GOAL #2   Title Active lumbar extension to 15 degrees to decrease joint reaction forces during functional activities.   Time 6   Period Weeks   Status New     PT LONG TERM GOAL #3    Title Sit 20 minutes with pain not > 3/10.   Time 6   Period Weeks   Status New     PT LONG TERM GOAL #4   Title Stand  20 minutes with pain not > 3/10.   Time 6   Period Weeks   Status New     PT LONG TERM GOAL #5   Title Perform ADL's with pain not > 3/10.   Time 6   Period Weeks   Status New                Plan - 03/08/17 1320    Clinical Impression Statement The patient presents to physical therapy with a high pain-level with c/o left sided low back pain.  he stands and walks in spinal flexion with a left lean due to pain.  His spinal range of motion is markedly decreased.  He c/o increased pain with a left SLR test and has some strength loss over his left LE.  His has a long h/o low back pain but that flre-up came on without incident about 3 months ago.  His lumbar erector spinae musculature is very guraded and taut.  Patient will benefit from skilled physical therapy to include manual techniques to address deficits and decrease pain and muscle spasms.   History and Personal Factors relevant to plan of care: Lumbar sureries previously.  Chronicity of condition.   Clinical Presentation Evolving   Clinical Presentation due to: Not improving.   Clinical Decision Making Moderate   Rehab Potential Good   PT Frequency 2x / week   PT Duration 6 weeks   PT Treatment/Interventions ADLs/Self Care Home Management;Cryotherapy;Electrical Stimulation;Moist Heat;Ultrasound;Functional mobility training;Patient/family education;Therapeutic exercise;Therapeutic activities;Manual techniques;Passive range of motion;Dry needling   PT Next Visit Plan Start with conservative care:  [place]; e'stim; STW/M; U/S.  Draw-ins, hip bridges and SKTC.  Patient may try dry needling.   Consulted and Agree with Plan of Care Patient      Patient will benefit from skilled therapeutic intervention in order to improve the following deficits and impairments:  Abnormal gait, Decreased activity tolerance, Decreased  mobility, Decreased range of motion, Decreased strength, Difficulty walking, Increased muscle spasms, Impaired flexibility, Postural dysfunction, Pain  Visit Diagnosis: Abnormal posture - Plan: PT plan of care cert/re-cert  Chronic left-sided low back pain, with sciatica presence unspecified - Plan: PT plan of care cert/re-cert     Problem List Patient Active Problem List   Diagnosis Date Noted  . Allergic reaction 04/03/2016  . Alpha-gal hypersensitivity 04/03/2016  . Drug reaction 12/14/2015  . Renal insufficiency 12/14/2015  . Anxiety 12/14/2015  . CAD S/P percutaneous coronary angioplasty 12/03/2015  . Abnormal nuclear stress test 12/02/2015  . Palpitations 09/16/2015  . Low back strain 11/13/2013  . Perennial allergic rhinitis 11/13/2013  . Annual physical exam 08/07/2013  . Multiple benign nevi   . Need for prophylactic vaccination and inoculation against influenza 04/28/2013  . Hyperlipidemia   . Tinnitus of both ears 03/06/2013  . Dyslipidemia 10/29/2012  . Hypothyroidism 10/29/2012  . GERD (gastroesophageal reflux disease) 10/29/2012  . Prostatitis, chronic 10/29/2012  . Chest pain 11/28/2011  . Dizziness 11/28/2011  . Essential hypertension 06/11/2008  . PULMONARY NODULE 06/11/2008    Vittoria Noreen, [place] MPT 03/08/2017, 1:30 PM  [facility] [address] [place], [place], [address] Phone: [phone]   Fax:  [phone]  Name: WALDRON GERRY MRN: [id number] Date of Birth: [birth year]

## 2017-03-14 ENCOUNTER — Ambulatory Visit: Payer: BLUE CROSS/BLUE SHIELD | Admitting: Physical Therapy

## 2017-03-14 DIAGNOSIS — M545 Low back pain: Secondary | ICD-10-CM | POA: Diagnosis not present

## 2017-03-14 DIAGNOSIS — R293 Abnormal posture: Secondary | ICD-10-CM

## 2017-03-14 DIAGNOSIS — G8929 Other chronic pain: Secondary | ICD-10-CM | POA: Diagnosis not present

## 2017-03-14 NOTE — Therapy (Signed)
[facility] [place], [place], [address] Phone: [phone]   Fax:  [phone]  Physical Therapy Treatment  Patient Details  Name: Jeffrey Hooper MRN: [id number] Date of Birth: Aug 20, 1960 Referring Provider: Su Ley  Encounter Date: 03/14/2017      PT End of Session - 03/14/17 1152    Visit Number 2   Number of Visits 12   Date for PT Re-Evaluation 04/19/17   PT Start Time 1120   PT Stop Time 1200   PT Time Calculation (min) 40 min   Activity Tolerance Patient tolerated treatment well   Behavior During Therapy [facility] for tasks assessed/performed      Past Medical History:  Diagnosis Date  . Allergy   . Anginal pain ([place])   . Arthritis   . CAD (coronary artery disease), native coronary artery 12/03/2015  . Coronary artery disease   . Dysrhythmia    "irregular and PAC'S"  . GERD (gastroesophageal reflux disease)   . Headache   . Hyperlipidemia   . Hypertension   . Hypothyroid   . Multiple benign nevi   . Neuromuscular disorder ([place]) 2011   LEFT RADIAL NERVE SURGERY R/T TRAUMA  . Pneumonia 2014, 2017  . Prostatism   . Pulmonary nodule   . Seasonal allergies     Past Surgical History:  Procedure Laterality Date  . BACK SURGERY     T12 - L1  . CARDIAC CATHETERIZATION N/A 12/02/2015   Procedure: Left Heart Cath and Coronary Angiography;  Surgeon: Peter M Martinique, MD;  Location: [place] CV LAB;  Service: Cardiovascular;  Laterality: N/A;  . CARDIAC CATHETERIZATION N/A 12/02/2015   Procedure: Coronary Stent Intervention;  Surgeon: Peter M Martinique, MD;  Location: [place] CV LAB;  Service: Cardiovascular;  Laterality: N/A;  mid LAD Promus 2.5x12  . CORONARY ANGIOPLASTY    . MASS EXCISION  06/25/2012   Procedure: EXCISION MASS;  Surgeon: Wynonia Sours, MD;  Location: [place];  Service: Orthopedics;  Laterality: Left;  transection of NEUROMA, BURYING RADIAL NERVE IN BRACHIORADIALIS LEFT  SIDE  . radial nerve    . SPINAL FUSION     C6-7  . TONSILLECTOMY  1982  . VASECTOMY      There were no vitals filed for this visit.          [facility] PT Assessment - 03/14/17 0001      Assessment   Medical Diagnosis Chronic left sided low back pain.   Referring Provider Su Ley     Precautions   Precautions None     Restrictions   Weight Bearing Restrictions No                     [facility] Adult PT Treatment/Exercise - 03/14/17 0001      Ambulation/Gait   Ambulation/Gait Yes   Ambulation/Gait Assistance 7: Independent   Ambulation Distance (Feet) 50 Feet   Gait Pattern Step-through pattern;Lateral trunk lean to left;Trunk flexed   Ambulation Surface Level;Indoor     Posture/Postural Control   Posture/Postural Control Postural limitations   Postural Limitations Rounded Shoulders;Forward head;Decreased lumbar lordosis;Flexed trunk   Posture Comments left lateral lean.     Exercises   Exercises Lumbar     Lumbar Exercises: Stretches   Active Hamstring Stretch 3 reps;30 seconds  left   Single Knee to Chest Stretch 3 reps;20 seconds   Piriformis Stretch 3 reps;20 seconds   Piriformis Stretch Limitations using a towel  roll to help lift bottom leg     Lumbar Exercises: Supine   Clam 10 reps;3 seconds   Bridge 10 reps;5 seconds   Straight Leg Raise 10 reps;2 seconds   Other Supine Lumbar Exercises hip abduction isometrics holding 5 seconds x 10 reps     Modalities   Modalities Electrical Stimulation;Moist Heat     Moist Heat Therapy   Number Minutes Moist Heat 15 Minutes   Moist Heat Location Lumbar Spine     Electrical Stimulation   Electrical Stimulation Location Lower lumbar.   Electrical Stimulation Action IFC   Electrical Stimulation Parameters 80-150 Hz x 15 minutes, intensity to tolerance   Electrical Stimulation Goals Tone;Pain                PT Education - 03/14/17 1150    Education provided Yes   Education Details  pirformis stretch, hamstring stretch added to HEP   Person(s) Educated Patient   Methods Explanation;Demonstration;Tactile cues;Verbal cues   Comprehension Verbalized understanding;Returned demonstration;Verbal cues required             PT Long Term Goals - 03/14/17 1218      PT LONG TERM GOAL #1   Title Independent with a HEP.   Status On-going     PT LONG TERM GOAL #2   Title Active lumbar extension to 15 degrees to decrease joint reaction forces during functional activities.   Period Weeks   Status New     PT LONG TERM GOAL #3   Title Sit 20 minutes with pain not > 3/10.   Period Weeks   Status New     PT LONG TERM GOAL #4   Title Stand 20 minutes with pain not > 3/10.   Time 6   Period Weeks   Status New     PT LONG TERM GOAL #5   Title Perform ADL's with pain not > 3/10.   Time 6   Period Weeks   Status New               Plan - 03/14/17 1153    Clinical Impression Statement Patient presenting to therapy today reporting 6/10 pain in his low back after working most of the morning. Pt tolerated mat exercises well, still complaining of pain at end of session of 3-4/10 in his left low back. Pt tolerated E-stim at end of session. continue with skilled PT to progress toward goals set.    Rehab Potential Good   PT Frequency 2x / week   PT Duration 6 weeks   PT Treatment/Interventions ADLs/Self Care Home Management;Cryotherapy;Electrical Stimulation;Moist Heat;Ultrasound;Functional mobility training;Patient/family education;Therapeutic exercise;Therapeutic activities;Manual techniques;Passive range of motion;Dry needling   PT Next Visit Plan Start with conservative care:  [place]; e'stim; STW/M; U/S.  Draw-ins, hip bridges and SKTC.  Patient may try dry needling.   Consulted and Agree with Plan of Care Patient      Patient will benefit from skilled therapeutic intervention in order to improve the following deficits and impairments:  Abnormal gait, Decreased  activity tolerance, Decreased mobility, Decreased range of motion, Decreased strength, Difficulty walking, Increased muscle spasms, Impaired flexibility, Postural dysfunction, Pain  Visit Diagnosis: Abnormal posture  Chronic left-sided low back pain, with sciatica presence unspecified     Problem List Patient Active Problem List   Diagnosis Date Noted  . Allergic reaction 04/03/2016  . Alpha-gal hypersensitivity 04/03/2016  . Drug reaction 12/14/2015  . Renal insufficiency 12/14/2015  . Anxiety 12/14/2015  . CAD S/P percutaneous  coronary angioplasty 12/03/2015  . Abnormal nuclear stress test 12/02/2015  . Palpitations 09/16/2015  . Low back strain 11/13/2013  . Perennial allergic rhinitis 11/13/2013  . Annual physical exam 08/07/2013  . Multiple benign nevi   . Need for prophylactic vaccination and inoculation against influenza 04/28/2013  . Hyperlipidemia   . Tinnitus of both ears 03/06/2013  . Dyslipidemia 10/29/2012  . Hypothyroidism 10/29/2012  . GERD (gastroesophageal reflux disease) 10/29/2012  . Prostatitis, chronic 10/29/2012  . Chest pain 11/28/2011  . Dizziness 11/28/2011  . Essential hypertension 06/11/2008  . PULMONARY NODULE 06/11/2008    Oretha Caprice, MPT 03/14/2017, 12:39 PM  [facility] [place], [place], [address] Phone: [phone]   Fax:  [phone]  Name: Jeffrey Hooper MRN: [id number] Date of Birth: 06-30-1961

## 2017-03-14 NOTE — Patient Instructions (Signed)
  Use a towel/rope, pull leg up keeping your knee straight Hold 30 seconds Repeat 3 times on each leg Twice a day   Pirformis Stretch: Cross left leg over your right Use a towel roll to help lift your right leg up off the floor/bed Hold 20 seconds Repeat 3 times Twice a day

## 2017-03-21 ENCOUNTER — Ambulatory Visit (INDEPENDENT_AMBULATORY_CARE_PROVIDER_SITE_OTHER): Payer: BLUE CROSS/BLUE SHIELD | Admitting: Pediatrics

## 2017-03-21 ENCOUNTER — Encounter: Payer: Self-pay | Admitting: Pediatrics

## 2017-03-21 VITALS — BP 116/80 | HR 50 | Temp 97.2°F | Ht 70.0 in | Wt 203.2 lb

## 2017-03-21 DIAGNOSIS — M5442 Lumbago with sciatica, left side: Secondary | ICD-10-CM | POA: Diagnosis not present

## 2017-03-21 DIAGNOSIS — H9313 Tinnitus, bilateral: Secondary | ICD-10-CM | POA: Diagnosis not present

## 2017-03-21 DIAGNOSIS — G4700 Insomnia, unspecified: Secondary | ICD-10-CM | POA: Diagnosis not present

## 2017-03-21 DIAGNOSIS — R29898 Other symptoms and signs involving the musculoskeletal system: Secondary | ICD-10-CM

## 2017-03-21 DIAGNOSIS — G8929 Other chronic pain: Secondary | ICD-10-CM | POA: Diagnosis not present

## 2017-03-21 MED ORDER — TRAMADOL HCL 50 MG PO TABS: 50.0000 mg | ORAL_TABLET | Freq: Four times a day (QID) | ORAL | 0 refills | Status: DC | PRN

## 2017-03-21 NOTE — Progress Notes (Signed)
  Subjective:   Patient ID: Jeffrey Hooper, male    DOB: 1960-12-11, 56 y.o.   MRN: [id number] CC: Back Pain (lower) and Numbness (Left leg, burning and aching)  HPI: Jeffrey Hooper is a 56 y.o. 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 Allergies and medications reviewed and updated. History  Smoking Status  . Former Smoker  . Packs/day: 1.00  . Years: 20.00  . Types: Cigarettes  . Quit date: 11/28/2006  Smokeless Tobacco  . Never Used   ROS: Per HPI   Objective:    BP 116/80   Pulse (!) 50   Temp (!) 97.2 F (36.2 C) (Oral)   Ht 5\' 10"  (1.778 m)   Wt 203 lb 3.2 oz (92.2 kg)   BMI 29.16 kg/m   Wt Readings from Last 3 Encounters:  03/21/17 203 lb 3.2 oz (92.2 kg)  03/06/17 200 lb (90.7 kg)  02/25/17 202 lb 12.8 oz (92 kg)    Gen: NAD, alert, cooperative with exam, NCAT EYES: EOMI, no conjunctival injection, or no icterus CV: NRRR, normal S1/S2, no murmur Resp: CTABL, no wheezes, normal WOB Ext: No edema, warm Neuro: Alert and oriented, strength equal b/l knee ext/flex, 4/5 ankle dorsiflex L side compared with R. decreased sensation to touch L lower leg compared with R lower leg MSK: normal ROM b/l knees, L knee with popping with extension from flexion, minmal crepitus  b/l  Assessment & Plan:  Jeffrey Hooper was seen today for back pain and numbness.  Diagnoses and all orders for this visit:  Tinnitus of both ears Cont valium prn, thinks is helping him rest at night, continues to have the tinnitus  Insomnia, unspecified type As above  Chronic left-sided low back pain with left-sided sciatica H/o back surgery Now 2 mo of worsening L leg burning, numbness and pain Trial of tramadol Will need to be seen for further tramadol Rx, needs to sign pain contract if ongoing Rx -     MR Lumbar Spine Wo Contrast; Future -     traMADol (ULTRAM) 50 MG tablet; Take 1 tablet (50 mg total) by mouth every 6 (six) hours as needed.  Popping sound of knee joint -     Ambulatory referral to Orthopedic Surgery   Follow up plan: 3 mo Jeffrey Found, MD [place]

## 2017-04-03 ENCOUNTER — Ambulatory Visit (HOSPITAL_COMMUNITY)
Admission: RE | Admit: 2017-04-03 | Discharge: 2017-04-03 | Disposition: A | Discharge status: Home / Self Care | Payer: BLUE CROSS/BLUE SHIELD | Source: Ambulatory Visit | Attending: Pediatrics | Admitting: Pediatrics

## 2017-04-03 DIAGNOSIS — M5126 Other intervertebral disc displacement, lumbar region: Secondary | ICD-10-CM | POA: Diagnosis not present

## 2017-04-03 DIAGNOSIS — M48061 Spinal stenosis, lumbar region without neurogenic claudication: Secondary | ICD-10-CM | POA: Diagnosis not present

## 2017-04-03 DIAGNOSIS — M5442 Lumbago with sciatica, left side: Secondary | ICD-10-CM | POA: Diagnosis not present

## 2017-04-03 DIAGNOSIS — G8929 Other chronic pain: Secondary | ICD-10-CM

## 2017-04-04 ENCOUNTER — Telehealth: Payer: Self-pay | Admitting: Pediatrics

## 2017-04-04 ENCOUNTER — Other Ambulatory Visit: Payer: Self-pay | Admitting: Pediatrics

## 2017-04-04 DIAGNOSIS — G8929 Other chronic pain: Secondary | ICD-10-CM

## 2017-04-04 DIAGNOSIS — M5410 Radiculopathy, site unspecified: Secondary | ICD-10-CM

## 2017-04-04 DIAGNOSIS — M5442 Lumbago with sciatica, left side: Principal | ICD-10-CM

## 2017-04-04 MED ORDER — TRAMADOL HCL 50 MG PO TABS: 50.0000 mg | ORAL_TABLET | Freq: Four times a day (QID) | ORAL | 0 refills | Status: DC | PRN

## 2017-04-04 NOTE — Telephone Encounter (Signed)
Spoke with pt re MRI results. Cont tramadol as needed. Referral in to the neurosurgeon

## 2017-04-04 NOTE — Telephone Encounter (Signed)
Refer to neurosurg based on recent MRI findings, ongoing pain and neuro symptoms in legs

## 2017-04-04 NOTE — Telephone Encounter (Signed)
Patient states he is still waiting to see the specialist. He states that the tramadol is not helping and wants to know if you can give him something stronger. Please advise and route to pool B

## 2017-04-10 ENCOUNTER — Ambulatory Visit (INDEPENDENT_AMBULATORY_CARE_PROVIDER_SITE_OTHER): Payer: BLUE CROSS/BLUE SHIELD | Admitting: Pediatrics

## 2017-04-10 ENCOUNTER — Encounter: Payer: Self-pay | Admitting: Pediatrics

## 2017-04-10 VITALS — BP 114/73 | HR 49 | Temp 96.8°F | Ht 70.0 in | Wt 200.0 lb

## 2017-04-10 DIAGNOSIS — M5410 Radiculopathy, site unspecified: Secondary | ICD-10-CM | POA: Diagnosis not present

## 2017-04-10 MED ORDER — HYDROCODONE-ACETAMINOPHEN 5-325 MG PO TABS: 1.0000 | ORAL_TABLET | Freq: Three times a day (TID) | ORAL | 0 refills | Status: DC | PRN

## 2017-04-10 NOTE — Progress Notes (Signed)
  Subjective:   Patient ID: Jeffrey Hooper, male    DOB: 23-Jul-1961, 56 y.o.   MRN: [id number] CC: Back Pain (Tramadol not helping)  HPI: Jeffrey Hooper is a 56 y.o. male presenting for Back Pain (Tramadol not helping)  Increasing pain in both legs, L still more than R Takes a few steps, starts getting tingling in L leg, afraid going to fall  Recent MRI with disc protrusion, contacts and displaces L4 nerve root  Taking tramadol and tylenol daily Not touching the pain Pain is mostly tingling, sometimes aching down back of L leg and into L ant upper leg  Relevant past medical, surgical, family and social history reviewed. Allergies and medications reviewed and updated. History  Smoking Status  . Former Smoker  . Packs/day: 1.00  . Years: 20.00  . Types: Cigarettes  . Quit date: 11/28/2006  Smokeless Tobacco  . Never Used   ROS: Per HPI   Objective:    BP 114/73   Pulse (!) 49   Temp (!) 96.8 F (36 C) (Oral)   Ht 5\' 10"  (1.778 m)   Wt 200 lb (90.7 kg)   BMI 28.70 kg/m   Wt Readings from Last 3 Encounters:  04/10/17 200 lb (90.7 kg)  03/21/17 203 lb 3.2 oz (92.2 kg)  03/06/17 200 lb (90.7 kg)    Gen: NAD, alert, cooperative with exam, NCAT EYES: EOMI, no conjunctival injection, or no icterus ENT:  OP without erythema LYMPH: no cervical LAD CV: NRRR, normal S1/S2 Resp: CTABL, no wheezes, normal WOB Abd: +BS, soft, NTND. no guarding or organomegaly Ext: No edema, warm Neuro: Alert and oriented, 4/5 L knee ext/flex, hip flex, 5/5 R knee ext/flex, hip flex, 1+ patellar reflex b/l, sensation intact b/l LE.   Assessment & Plan:  Jeffrey Hooper was seen today for back pain.  Diagnoses and all orders for this visit:  Radiculopathy, unspecified spinal region Weakness L leg Disc protrusions on MRI Referral in to NSG, cont tylenol prn Use below prn -     HYDROcodone-acetaminophen (NORCO/VICODIN) 5-325 MG tablet; Take 1-2 tablets by mouth every 8 (eight) hours as needed for  moderate pain.   Follow up plan: 3 mo Assunta Found, MD [place]

## 2017-04-17 ENCOUNTER — Other Ambulatory Visit: Payer: Self-pay | Admitting: Pediatrics

## 2017-04-17 DIAGNOSIS — E039 Hypothyroidism, unspecified: Secondary | ICD-10-CM

## 2017-04-17 DIAGNOSIS — I10 Essential (primary) hypertension: Secondary | ICD-10-CM

## 2017-04-18 ENCOUNTER — Telehealth: Payer: Self-pay | Admitting: *Deleted

## 2017-04-18 DIAGNOSIS — M5410 Radiculopathy, site unspecified: Secondary | ICD-10-CM

## 2017-04-18 MED ORDER — HYDROCODONE-ACETAMINOPHEN 5-325 MG PO TABS: 1.0000 | ORAL_TABLET | Freq: Three times a day (TID) | ORAL | 0 refills | Status: DC | PRN

## 2017-04-18 NOTE — Telephone Encounter (Signed)
Pt requesting note to be out of work until eval by neurosurgeon Pt also needs refill on pain med

## 2017-04-18 NOTE — Telephone Encounter (Signed)
Pt given RX and work note. Okayed per Dr Evette Doffing

## 2017-04-27 ENCOUNTER — Other Ambulatory Visit: Payer: Self-pay | Admitting: Pediatrics

## 2017-04-27 DIAGNOSIS — K219 Gastro-esophageal reflux disease without esophagitis: Secondary | ICD-10-CM

## 2017-04-27 DIAGNOSIS — F419 Anxiety disorder, unspecified: Secondary | ICD-10-CM

## 2017-05-02 DIAGNOSIS — Z6828 Body mass index (BMI) 28.0-28.9, adult: Secondary | ICD-10-CM | POA: Diagnosis not present

## 2017-05-02 DIAGNOSIS — M4727 Other spondylosis with radiculopathy, lumbosacral region: Secondary | ICD-10-CM | POA: Diagnosis not present

## 2017-05-02 DIAGNOSIS — I10 Essential (primary) hypertension: Secondary | ICD-10-CM | POA: Diagnosis not present

## 2017-05-03 ENCOUNTER — Other Ambulatory Visit: Payer: Self-pay | Admitting: Neurological Surgery

## 2017-05-03 ENCOUNTER — Telehealth: Payer: Self-pay | Admitting: Pediatrics

## 2017-05-03 DIAGNOSIS — M5410 Radiculopathy, site unspecified: Secondary | ICD-10-CM

## 2017-05-03 MED ORDER — HYDROCODONE-ACETAMINOPHEN 5-325 MG PO TABS
1.0000 | ORAL_TABLET | Freq: Three times a day (TID) | ORAL | 0 refills | Status: DC | PRN
Start: 2017-05-03 — End: 2017-05-06

## 2017-05-03 NOTE — Telephone Encounter (Addendum)
Called pt back. Has surgery scheduled for Oct 29 for spinal fusion with neurosurgery. Currently has limited mobility, pain with movement. Continues to have weakness, tingling in legs, mostly L leg at this time. Some pain R side.  One additional Rx hydrocodone for #60 tabs written. [place] SRS reviewed and appropriate.

## 2017-05-03 NOTE — Addendum Note (Signed)
Addended by: [name] on: 05/03/2017 04:26 PM   Modules accepted: Orders

## 2017-05-03 NOTE — Telephone Encounter (Signed)
Patient called stating that he seen Neurosurgeon and will need surgery.  Level 3 fusion

## 2017-05-06 ENCOUNTER — Other Ambulatory Visit: Payer: Self-pay

## 2017-05-06 DIAGNOSIS — M5410 Radiculopathy, site unspecified: Secondary | ICD-10-CM

## 2017-05-06 MED ORDER — HYDROCODONE-ACETAMINOPHEN 5-325 MG PO TABS: 1.0000 | ORAL_TABLET | Freq: Three times a day (TID) | ORAL | 0 refills | Status: DC | PRN

## 2017-05-06 NOTE — Telephone Encounter (Signed)
Unable to find previous rx. Written by Dr. Evette Doffing.  New Rx given for 1 week until Dr. Evette Doffing is back in the office.

## 2017-05-09 ENCOUNTER — Telehealth: Payer: Self-pay | Admitting: *Deleted

## 2017-05-09 NOTE — Telephone Encounter (Signed)
Requesting surgical clearance:   1. Type of surgery: L2-3 L3-4 L4-5 Posterior lumbar Interbody Fusion  2. Surgeon: Dr Kristeen Miss  3. Surgical date: 06/03/2017  4. Medications that need to be held: Plavix  5. NeuroSurgery & Spine: (P) [phone] (F) [phone]    Is pt cleared for surgery and how long can Plavix be held?

## 2017-05-10 ENCOUNTER — Ambulatory Visit (INDEPENDENT_AMBULATORY_CARE_PROVIDER_SITE_OTHER): Payer: Self-pay | Admitting: Orthopaedic Surgery

## 2017-05-10 DIAGNOSIS — M4726 Other spondylosis with radiculopathy, lumbar region: Secondary | ICD-10-CM | POA: Diagnosis not present

## 2017-05-10 DIAGNOSIS — M48061 Spinal stenosis, lumbar region without neurogenic claudication: Secondary | ICD-10-CM | POA: Diagnosis not present

## 2017-05-10 DIAGNOSIS — Z981 Arthrodesis status: Secondary | ICD-10-CM | POA: Diagnosis not present

## 2017-05-10 DIAGNOSIS — M5136 Other intervertebral disc degeneration, lumbar region: Secondary | ICD-10-CM | POA: Diagnosis not present

## 2017-05-10 DIAGNOSIS — M5416 Radiculopathy, lumbar region: Secondary | ICD-10-CM | POA: Diagnosis not present

## 2017-05-13 NOTE — Telephone Encounter (Signed)
He is overdue for six month follow up.  I will see him for this and for preop clearance.

## 2017-05-15 NOTE — Telephone Encounter (Signed)
Message send to Jeffrey Hooper to schedule pt in [place] for pre op clearance

## 2017-05-19 NOTE — Progress Notes (Signed)
And white he will keep this.    Cardiology Office Note   Date:  05/21/2017   ID:  MARX DOIG, DOB 1961/06/24, MRN [id number]  PCP:  [name], MD  Cardiologist:   Minus Breeding, MD   Chief Complaint  Patient presents with  . Coronary Artery Disease      History of Present Illness: Jeffrey Hooper is a 56 y.o. male who presents initially for evaluation of recent PCI.  Last year he had chest pain and palpitations. Stress perfusion study suggested a moderate sized defect in the inferior wall with ischemia. The EF was well-preserved.  The patient presented to [facility] on 12/02/15 for cath and was found to have single vessel obstructive CAD involving the mid LAD. He underwent successful PCI with a mid LAD DES.  He was placed on DAPT with ASA and Brilinta, high intensity statin, metoprolol and amlodipine.  Beta blocker was stopped in hospital because of bradycardia.   He had to come back to the hospital with tachycardia however thought to be related to the beta blocker withdrawal.  His beta blocker was restarted.  Also the patient seemed to have hives with Brilinta.  He was switched to Plavix.  He presents for follow up.  In addition he is scheduled to have spine surgery.    Since I last saw him he has done well.  He is very limited because of the back pain.  However, with his activity he denies any cardiovascular symptoms.  He has not needed a NTG.  The patient denies any new symptoms such as chest discomfort, neck or arm discomfort. There has been no new shortness of breath, PND or orthopnea. There have been no reported palpitations, presyncope or syncope.  He still has some rare palpitation.s    Past Medical History:  Diagnosis Date  . Allergy   . Anginal pain ([place])   . Arthritis   . CAD (coronary artery disease), native coronary artery 12/03/2015  . Coronary artery disease   . Dysrhythmia    "irregular and PAC'S"  . GERD (gastroesophageal reflux disease)   . Headache   .  Hyperlipidemia   . Hypertension   . Hypothyroid   . Multiple benign nevi   . Neuromuscular disorder ([place]) 2011   LEFT RADIAL NERVE SURGERY R/T TRAUMA  . Pneumonia 2014, 2017  . Prostatism   . Pulmonary nodule   . Seasonal allergies     Past Surgical History:  Procedure Laterality Date  . BACK SURGERY     T12 - L1  . CARDIAC CATHETERIZATION N/A 12/02/2015   Procedure: Left Heart Cath and Coronary Angiography;  Surgeon: Peter M Martinique, MD;  Location: [place] [facility];  Service: Cardiovascular;  Laterality: N/A;  . CARDIAC CATHETERIZATION N/A 12/02/2015   Procedure: Coronary Stent Intervention;  Surgeon: Peter M Martinique, MD;  Location: [place] [facility];  Service: Cardiovascular;  Laterality: N/A;  mid LAD Promus 2.5x12  . CORONARY ANGIOPLASTY    . MASS EXCISION  06/25/2012   Procedure: EXCISION MASS;  Surgeon: Wynonia Sours, MD;  Location: [place];  Service: Orthopedics;  Laterality: Left;  transection of NEUROMA, BURYING RADIAL NERVE IN BRACHIORADIALIS LEFT SIDE  . radial nerve    . SPINAL FUSION     C6-7  . TONSILLECTOMY  1982  . VASECTOMY       Current Outpatient Prescriptions  Medication Sig Dispense Refill  . acetaminophen (TYLENOL) 500 MG tablet Take 1,000 mg by  mouth every 6 (six) hours as needed (pain).     [name] amLODipine (NORVASC) 5 MG tablet TAKE 1 TABLET (5 MG TOTAL) BY MOUTH DAILY. 90 tablet 1  . aspirin EC 81 MG tablet Take 81 mg by mouth at bedtime.    [name] atorvastatin (LIPITOR) 80 MG tablet Take 1 tablet (80 mg total) by mouth daily. 90 tablet 3  . diazepam (VALIUM) 2 MG tablet Take 1 tablet (2 mg total) by mouth every 12 (twelve) hours as needed for anxiety. 60 tablet 1  . diphenhydrAMINE (BENADRYL) 25 MG tablet Take 50 mg by mouth every 6 (six) hours as needed for itching.    . DULoxetine (CYMBALTA) 60 MG capsule TAKE 1 CAPSULE BY MOUTH EVERY DAY 30 capsule 2  . EPINEPHrine 0.3 mg/0.3 mL IJ SOAJ injection Inject 0.3 mLs (0.3 mg total) into the  muscle once. 2 Device 1  . fluticasone (FLONASE) 50 MCG/ACT nasal spray PLACE 2 SPRAYS INTO BOTH NOSTRILS DAILY. 16 g 5  . hydrochlorothiazide (HYDRODIURIL) 25 MG tablet TAKE 1 TABLET (25 MG TOTAL) BY MOUTH DAILY. 90 tablet 1  . HYDROcodone-acetaminophen (NORCO/VICODIN) 5-325 MG tablet Take 1 tablet by mouth every 8 (eight) hours as needed for moderate pain. 21 tablet 0  . levocetirizine (XYZAL) 5 MG tablet TAKE 1/2 TABLET (2.5MG ) TWICE A DAY FOR HIVES/ITCH 30 tablet 3  . levothyroxine (SYNTHROID, LEVOTHROID) 100 MCG tablet TAKE 1 TABLET (100 MCG TOTAL) BY MOUTH DAILY. 90 tablet 2  . Melatonin 10 MG TABS Take 10 mg by mouth at bedtime.     . metoprolol tartrate (LOPRESSOR) 25 MG tablet TAKE 1/2 TABLET (12.5 MG TOTAL) BY MOUTH 2 (TWO) TIMES DAILY. 90 tablet 3  . nitroGLYCERIN (NITROSTAT) 0.4 MG SL tablet Place 1 tablet (0.4 mg total) under the tongue every 5 (five) minutes as needed for chest pain. 20 tablet 2  . pantoprazole (PROTONIX) 40 MG tablet TAKE 1 TABLET BY MOUTH EVERY DAY 30 tablet 5   No current facility-administered medications for this visit.     Allergies:   Prednisone; Brilinta [ticagrelor]; Doxycycline; Doxycycline hyclate; Penicillins; Shellfish allergy; Beef extract; Beef-derived products; Fentanyl; Lambs quarters; Penicillin g; and Pork-derived products    ROS:  Please see the history of present illness.   Otherwise, review of systems are positive for none.   All other systems are reviewed and negative.    PHYSICAL EXAM: VS:  BP 130/64 (BP Location: Right Arm, Patient Position: Sitting, Cuff Size: Normal)   Pulse (!) 44   Ht 5\' 10"  (1.778 m)   Wt 190 lb (86.2 kg)   BMI 27.26 kg/m  , BMI Body mass index is 27.26 kg/m.  GENERAL:  Well appearing NECK:  No jugular venous distention, waveform within normal limits, carotid upstroke brisk and symmetric, no bruits, no thyromegaly LUNGS:  Clear to auscultation bilaterally CHEST:  Unremarkable HEART:  PMI not displaced or  sustained,S1 and S2 within normal limits, no S3, no S4, no clicks, no rubs, no murmurs ABD:  Flat, positive bowel sounds normal in frequency in pitch, no bruits, no rebound, no guarding, no midline pulsatile mass, no hepatomegaly, no splenomegaly EXT:  2 plus pulses throughout, no edema, no cyanosis no clubbing   EKG:  EKG  Is  ordered today. Sinus rhythm, rate 43 , axis within normal limits, intervals within normal limits, no acute ST-T wave changes.  Recent Labs: 09/12/2016: ALT 32; BUN 12; Creatinine, Ser 1.04; Hemoglobin 14.2; Platelets 207; Potassium 4.0; Sodium 144; TSH 3.270  Lipid Panel    Component Value Date/Time   CHOL 129 09/12/2016 1324   CHOL 136 10/29/2012 0922   TRIG 119 09/12/2016 1324   TRIG 139 04/28/2013 1505   TRIG 103 10/29/2012 0922   HDL 47 09/12/2016 1324   HDL 44 04/28/2013 1505   HDL 41 10/29/2012 0922   CHOLHDL 2.7 09/12/2016 1324   LDLCALC 58 09/12/2016 1324   LDLCALC 50 04/28/2013 1505   LDLCALC 74 10/29/2012 0922      Wt Readings from Last 3 Encounters:  05/20/17 190 lb (86.2 kg)  04/10/17 200 lb (90.7 kg)  03/21/17 203 lb 3.2 oz (92.2 kg)      Other studies Reviewed: Additional studies/ records that were reviewed today include: None Review of the above records demonstrates:    ASSESSMENT AND PLAN:  Preop Op The patient has no high risk findings.  He is not going for a high risk procedure.  According to ACC/AHA guidelines he is at acceptable risk for the planned surgery.  It is OK for him to stop his Plavix.  However, I would suggest that he continue ASA unless the risk for surgery is prohibitive on ASA.  He would be at higher risk for stent thrombosis even at this late stage without any antiplatelet.   CAD S/P percutaneous coronary angioplasty The patient has no new sypmtoms.  No further cardiovascular testing is indicated.  We will continue with aggressive risk reduction and meds as listed.  Essential hypertension The blood pressure  is at target. No change in medications is indicated. We will continue with therapeutic lifestyle changes (TLC).  Palpitations These are not as problematic.  He will continue meds as listed.  No further testing.   Dyslipidemia His lipids were OK earlier this year.  No change in therapy.    Bradycardia He has no symptoms related to this.  He will continue meds as listed.      Current medicines are reviewed at length with the patient today.  The patient does not have concerns regarding medicines.  The following changes have been made:  As above  Labs/ tests ordered today include:      No orders of the defined types were placed in this encounter.    Disposition:   FU with me in 12 months.    Signed, Minus Breeding, MD  05/21/2017 12:49 PM    [place]

## 2017-05-20 ENCOUNTER — Ambulatory Visit (INDEPENDENT_AMBULATORY_CARE_PROVIDER_SITE_OTHER): Payer: BLUE CROSS/BLUE SHIELD | Admitting: Cardiology

## 2017-05-20 ENCOUNTER — Encounter: Payer: Self-pay | Admitting: Cardiology

## 2017-05-20 VITALS — BP 130/64 | HR 44 | Ht 70.0 in | Wt 190.0 lb

## 2017-05-20 DIAGNOSIS — R001 Bradycardia, unspecified: Secondary | ICD-10-CM

## 2017-05-20 DIAGNOSIS — E785 Hyperlipidemia, unspecified: Secondary | ICD-10-CM

## 2017-05-20 DIAGNOSIS — I10 Essential (primary) hypertension: Secondary | ICD-10-CM | POA: Diagnosis not present

## 2017-05-20 DIAGNOSIS — Z9861 Coronary angioplasty status: Secondary | ICD-10-CM | POA: Diagnosis not present

## 2017-05-20 DIAGNOSIS — Z01810 Encounter for preprocedural cardiovascular examination: Secondary | ICD-10-CM | POA: Diagnosis not present

## 2017-05-20 DIAGNOSIS — I2510 Atherosclerotic heart disease of native coronary artery without angina pectoris: Secondary | ICD-10-CM

## 2017-05-20 NOTE — Patient Instructions (Signed)
Medication Instructions:  STOP- Plavix  If you need a refill on your cardiac medications before your next appointment, please call your pharmacy.  Labwork: None Ordered   Testing/Procedures: None Ordered  Follow-Up: Your physician wants you to follow-up in: 1 Year. You should receive a reminder letter in the mail two months in advance. If you do not receive a letter, please call our office [phone].   Thank you for choosing [facility] at [facility]!!

## 2017-05-21 ENCOUNTER — Encounter: Payer: Self-pay | Admitting: Cardiology

## 2017-05-28 ENCOUNTER — Ambulatory Visit (INDEPENDENT_AMBULATORY_CARE_PROVIDER_SITE_OTHER): Payer: BLUE CROSS/BLUE SHIELD | Admitting: Pediatrics

## 2017-05-28 ENCOUNTER — Encounter: Payer: Self-pay | Admitting: Pediatrics

## 2017-05-28 DIAGNOSIS — E039 Hypothyroidism, unspecified: Secondary | ICD-10-CM

## 2017-05-28 DIAGNOSIS — J3089 Other allergic rhinitis: Secondary | ICD-10-CM

## 2017-05-28 DIAGNOSIS — F419 Anxiety disorder, unspecified: Secondary | ICD-10-CM

## 2017-05-28 DIAGNOSIS — I2510 Atherosclerotic heart disease of native coronary artery without angina pectoris: Secondary | ICD-10-CM

## 2017-05-28 DIAGNOSIS — Z9861 Coronary angioplasty status: Secondary | ICD-10-CM | POA: Diagnosis not present

## 2017-05-28 DIAGNOSIS — H9313 Tinnitus, bilateral: Secondary | ICD-10-CM

## 2017-05-28 DIAGNOSIS — E785 Hyperlipidemia, unspecified: Secondary | ICD-10-CM | POA: Diagnosis not present

## 2017-05-28 DIAGNOSIS — M5410 Radiculopathy, site unspecified: Secondary | ICD-10-CM | POA: Diagnosis not present

## 2017-05-28 DIAGNOSIS — T7800XD Anaphylactic reaction due to unspecified food, subsequent encounter: Secondary | ICD-10-CM

## 2017-05-28 MED ORDER — DIAZEPAM 2 MG PO TABS: 2.0000 mg | ORAL_TABLET | Freq: Two times a day (BID) | ORAL | 1 refills | Status: DC | PRN

## 2017-05-28 MED ORDER — HYDROCODONE-ACETAMINOPHEN 10-300 MG PO TABS: 1.0000 | ORAL_TABLET | Freq: Four times a day (QID) | ORAL | 0 refills | Status: DC | PRN

## 2017-05-28 MED ORDER — METOPROLOL TARTRATE 25 MG PO TABS: ORAL_TABLET | ORAL | 1 refills | Status: DC

## 2017-05-28 MED ORDER — LEVOTHYROXINE SODIUM 100 MCG PO TABS: 100.0000 ug | ORAL_TABLET | Freq: Every day | ORAL | 2 refills | Status: DC

## 2017-05-28 MED ORDER — LEVOCETIRIZINE DIHYDROCHLORIDE 5 MG PO TABS: 5.0000 mg | ORAL_TABLET | Freq: Every evening | ORAL | 3 refills | Status: DC

## 2017-05-28 MED ORDER — DULOXETINE HCL 30 MG PO CPEP: 30.0000 mg | ORAL_CAPSULE | Freq: Every day | ORAL | 1 refills | Status: DC

## 2017-05-28 MED ORDER — ATORVASTATIN CALCIUM 80 MG PO TABS: 80.0000 mg | ORAL_TABLET | Freq: Every day | ORAL | 1 refills | Status: DC

## 2017-05-28 NOTE — Progress Notes (Signed)
Subjective:   Patient ID: Jeffrey Hooper, male    DOB: 05-18-1961, 56 y.o.   MRN: [id number] CC: Follow-up (3 month) multiple med problems HPI: Jeffrey Hooper is a 56 y.o. male presenting for Follow-up (3 month)  Still with pain in back and lower legs, L > R Has tried some steroid shots in back  Has surgery scheduled for Monday Taking pain medication 10mg  apprx 3 -4 times a day now  Tinnitus: continues to bother him isnt sure if valium is helping Says he feels better able to deal with it Taking the valium up to three times a day Ran out about a week ago hasnt noticed much difference  Anxiety: thinks anxiety is doing ok Doesn't think the cymbalta is making much difference would like to stop  CAD: taken off of plavix by cardiology for upcoming surgery No CP, SOB Has pre-op appt at hospital tomorrow   Relevant past medical, surgical, family and social history reviewed. Allergies and medications reviewed and updated. History  Smoking Status  . Former Smoker  . Packs/day: 1.00  . Years: 20.00  . Types: Cigarettes  . Quit date: 11/28/2006  Smokeless Tobacco  . Never Used   ROS: Per HPI   Objective:    BP 121/75   Pulse (!) 55   Temp 97.6 F (36.4 C) (Oral)   Ht 5\' 10"  (1.778 m)   Wt 189 lb (85.7 kg)   BMI 27.12 kg/m   Wt Readings from Last 3 Encounters:  05/28/17 189 lb (85.7 kg)  05/20/17 190 lb (86.2 kg)  04/10/17 200 lb (90.7 kg)    Gen: NAD, alert, cooperative with exam, NCAT EYES: EOMI, no conjunctival injection, or no icterus ENT: OP without erythema CV: NRRR, normal S1/S2, no murmur Resp: CTABL, no wheezes, normal WOB Ext: No edema, warm Neuro: Alert and oriented MSK: normal muscle bulk  Assessment & Plan:  Claudy was seen today for follow-up multiple med problems  Diagnoses and all orders for this visit:  Radiculopathy, unspecified spinal region Rx for #28 tabs of below given Ongoing pain, radiculopathy Upcoming surgery on back to treat  symptoms -     Hydrocodone-Acetaminophen 10-300 MG TABS; Take 1 tablet by mouth every 6 (six) hours as needed.  Hypothyroidism, unspecified type Stable, cont below -     levothyroxine (SYNTHROID, LEVOTHROID) 100 MCG tablet; Take 1 tablet (100 mcg total) by mouth daily.  CAD S/P percutaneous coronary angioplasty Asymptomatic, cont below -     metoprolol tartrate (LOPRESSOR) 25 MG tablet; TAKE 1/2 TABLET (12.5 MG TOTAL) BY MOUTH 2 (TWO) TIMES DAILY.  Tinnitus of both ears Stable, isnt sure if below helps Doing better he thinks Doesn't have any left With upcoming surgery will do additional rx Take as needed -     diazepam (VALIUM) 2 MG tablet; Take 1 tablet (2 mg total) by mouth every 12 (twelve) hours as needed for anxiety.  Hyperlipidemia, unspecified hyperlipidemia type Stable, cont below -     atorvastatin (LIPITOR) 80 MG tablet; Take 1 tablet (80 mg total) by mouth at bedtime.  Anxiety Stable, wants to decrease cymbalta Rx decrease to 30mg  -     DULoxetine (CYMBALTA) 30 MG capsule; Take 1 capsule (30 mg total) by mouth daily.  Perennial allergic rhinitis -     levocetirizine (XYZAL) 5 MG tablet; Take 1 tablet (5 mg total) by mouth every evening.  Follow up plan: Return in about 3 months (around 08/28/2017). Assunta Found, MD [facility]

## 2017-05-28 NOTE — Pre-Procedure Instructions (Signed)
KASHTYN JANKOWSKI  05/28/2017      [id number] - [place], [place] - [address] [place] [address] Phone: [phone] Fax: [phone]    Your procedure is scheduled on Monday October 29.  Report to [facility] Admitting at 5:30 A.M.  Call this number if you have problems the morning of surgery:  [phone]   Remember:  Do not eat food or drink liquids after midnight.  Take these medicines the morning of surgery with A SIP OF WATER:   amlodipine (norvasc) Metoprolol (lopressor) Duloxetine (cymbalta) Levothyroxine (synthroid) Pantoprazole (protonix) Hydrocodone-acetaminophen if needed Flonase If needed Diazepam (valium) if needed  7 days prior to surgery STOP taking any Aleve, Naproxen, Ibuprofen, Motrin, Advil, Goody's, BC's, all herbal medications, fish oil, and all vitamins  FOLLOW MD's instructions on stopping Aspirin    Do not wear jewelry, make-up or nail polish.  Do not wear lotions, powders, or perfumes, or deoderant.  Do not shave 48 hours prior to surgery.  Men may shave face and neck.  Do not bring valuables to the hospital.  [facility] is not responsible for any belongings or valuables.  Contacts, dentures or bridgework may not be worn into surgery.  Leave your suitcase in the car.  After surgery it may be brought to your room.  For patients admitted to the hospital, discharge time will be determined by your treatment team.  Patients discharged the day of surgery will not be allowed to drive home.    Special instructions:    [facility]- Preparing For Surgery  Before surgery, you can play an important role. Because skin is not sterile, your skin needs to be as free of germs as possible. You can reduce the number of germs on your skin by washing with CHG (chlorahexidine gluconate) Soap before surgery.  CHG is an antiseptic cleaner which kills germs and bonds with the skin to continue killing germs  even after washing.  Please do not use if you have an allergy to CHG or antibacterial soaps. If your skin becomes reddened/irritated stop using the CHG.  Do not shave (including legs and underarms) for at least 48 hours prior to first CHG shower. It is OK to shave your face.  Please follow these instructions carefully.   1. Shower the NIGHT BEFORE SURGERY and the MORNING OF SURGERY with CHG.   2. If you chose to wash your hair, wash your hair first as usual with your normal shampoo.  3. After you shampoo, rinse your hair and body thoroughly to remove the shampoo.  4. Use CHG as you would any other liquid soap. You can apply CHG directly to the skin and wash gently with a scrungie or a clean washcloth.   5. Apply the CHG Soap to your body ONLY FROM THE NECK DOWN.  Do not use on open wounds or open sores. Avoid contact with your eyes, ears, mouth and genitals (private parts). Wash Face and genitals (private parts)  with your normal soap.  6. Wash thoroughly, paying special attention to the area where your surgery will be performed.  7. Thoroughly rinse your body with warm water from the neck down.  8. DO NOT shower/wash with your normal soap after using and rinsing off the CHG Soap.  9. Pat yourself dry with a CLEAN TOWEL.  10. Wear CLEAN PAJAMAS to bed the night before surgery, wear comfortable clothes the morning of surgery  11. Place CLEAN SHEETS  on your bed the night of your first shower and DO NOT SLEEP WITH PETS.    Day of Surgery: Do not apply any deodorants/lotions. Please wear clean clothes to the hospital/surgery center.      Please read over the following fact sheets that you were given. Coughing and Deep Breathing and MRSA Information

## 2017-05-29 ENCOUNTER — Encounter (HOSPITAL_COMMUNITY)
Admission: RE | Admit: 2017-05-29 | Discharge: 2017-05-29 | Disposition: A | Discharge status: Home / Self Care | Payer: BLUE CROSS/BLUE SHIELD | Source: Ambulatory Visit | Attending: Neurological Surgery | Admitting: Neurological Surgery

## 2017-05-29 ENCOUNTER — Encounter (HOSPITAL_COMMUNITY): Payer: Self-pay

## 2017-05-29 DIAGNOSIS — F419 Anxiety disorder, unspecified: Secondary | ICD-10-CM | POA: Insufficient documentation

## 2017-05-29 DIAGNOSIS — M5410 Radiculopathy, site unspecified: Secondary | ICD-10-CM | POA: Insufficient documentation

## 2017-05-29 DIAGNOSIS — E039 Hypothyroidism, unspecified: Secondary | ICD-10-CM | POA: Insufficient documentation

## 2017-05-29 DIAGNOSIS — I2510 Atherosclerotic heart disease of native coronary artery without angina pectoris: Secondary | ICD-10-CM | POA: Insufficient documentation

## 2017-05-29 DIAGNOSIS — Z87891 Personal history of nicotine dependence: Secondary | ICD-10-CM | POA: Insufficient documentation

## 2017-05-29 DIAGNOSIS — E785 Hyperlipidemia, unspecified: Secondary | ICD-10-CM | POA: Diagnosis not present

## 2017-05-29 DIAGNOSIS — Z01818 Encounter for other preprocedural examination: Secondary | ICD-10-CM | POA: Diagnosis not present

## 2017-05-29 HISTORY — DX: Personal history of urinary calculi: Z87.442

## 2017-05-29 HISTORY — DX: Anxiety disorder, unspecified: F41.9

## 2017-05-29 LAB — BASIC METABOLIC PANEL
ANION GAP: 10 (ref 5–15)
BUN: 18 mg/dL (ref 6–20)
CALCIUM: 9 mg/dL (ref 8.9–10.3)
CO2: 28 mmol/L (ref 22–32)
CREATININE: 1.05 mg/dL (ref 0.61–1.24)
Chloride: 99 mmol/L — ABNORMAL LOW (ref 101–111)
GFR calc Af Amer: >60 mL/min (ref >60)
GFR calc non Af Amer: >60 mL/min (ref >60)
GLUCOSE: 121 mg/dL — AB (ref 65–99)
Potassium: 3.5 mmol/L (ref 3.5–5.1)
Sodium: 137 mmol/L (ref 135–145)

## 2017-05-29 LAB — CBC
HEMATOCRIT: 44 % (ref 39.0–52.0)
HEMOGLOBIN: 15.3 g/dL (ref 13.0–17.0)
MCH: 31.4 pg (ref 26.0–34.0)
MCHC: 34.8 g/dL (ref 30.0–36.0)
MCV: 90.3 fL (ref 78.0–100.0)
Platelets: 234 10*3/uL (ref 150–400)
RBC: 4.87 MIL/uL (ref 4.22–5.81)
RDW: 12.8 % (ref 11.5–15.5)
WBC: 10.7 10*3/uL — ABNORMAL HIGH (ref 4.0–10.5)

## 2017-05-29 LAB — ABO/RH: ABO/RH(D): A POS

## 2017-05-29 LAB — SURGICAL PCR SCREEN
MRSA, PCR: NEGATIVE
STAPHYLOCOCCUS AUREUS: NEGATIVE

## 2017-05-29 LAB — TYPE AND SCREEN
ABO/RH(D): A POS
Antibody Screen: NEGATIVE

## 2017-05-29 NOTE — Progress Notes (Signed)
PCP - Dr. Evette Doffing Cardiologist - Dr. [name]; Cardiac clearance in Epic note on 05/20/2017; patient states that his plavix was discontinued and last dose was on 05/20/2017  Chest x-ray - 07/26/2017 EKG - 05/20/2017 Stress Test - 11/24/2015 ECHO - 11/25/2015 Cardiac Cath - 12/02/2015   Sleep Study - patient denies   Patient denies shortness of breath, fever, cough and chest pain at PAT appointment   Patient verbalized understanding of instructions that were given to them at the PAT appointment. Patient was also instructed that they will need to review over the PAT instructions again at home before surgery.   Anesthesia review for cardiac history.

## 2017-05-30 NOTE — Progress Notes (Signed)
Anesthesia Chart Review:  Pt is a 56 year old male scheduled for L2-3, L3-4, L4-5 PLIF on 06/03/2017 with Kristeen Miss, MD  - PCP is Assunta Found, MD. Last office visit note 05/28/17 documents PCP is aware of upcoming surgery.  - Cardiologist is Minus Breeding, MD who cleared pt for surgery at last office visit 05/20/17.   PMH includes:  CAD (DES to LAD 12/02/15), HTN, hyperlipidemia, hypothyroidism, GERD. Former smoker. BMI 27  Medications include: amlodipine, ASA 81mg , hctz, levothyroxine, metoprolol, protonix.   BP 124/72   Pulse (!) 59   Temp 36.6 C   Resp 20   Ht 5\' 10"  (1.778 m)   Wt 188 lb (85.3 kg)   SpO2 100%   BMI 26.98 kg/m    Preoperative labs reviewed.    CXR 07/26/16: Hyperinflated lungs without acute infiltrate.  EKG 05/20/17: Marked sinus bradycardia (43 bpm).   Cardiac cath 12/02/15:   Prox RCA to Mid RCA lesion, 30% stenosed.  Mid LAD-1 lesion, 40% stenosed.  Mid LAD-2 lesion, 95% stenosed. Post intervention, there is a 15% residual stenosis. 1. Single vessel obstructive CAD involving the mid LAD 2. Successful stenting of the Mid LAD with DES.  Echo 11/25/15:  - Left ventricle: The cavity size was normal. Systolic function was normal. The estimated ejection fraction was in the range of 60% to 65%. Wall motion was normal; there were no regional wall motion abnormalities. - Aortic valve: There was trivial regurgitation. - Mitral valve: There was mild regurgitation. - Left atrium: The atrium was mildly dilated.  If no changes, I anticipate pt can proceed with surgery as scheduled.   Willeen Cass, FNP-BC [facility]/Anesthesiology Phone: [phone] 05/30/2017 12:13 PM

## 2017-06-03 ENCOUNTER — Inpatient Hospital Stay (HOSPITAL_COMMUNITY): Payer: BLUE CROSS/BLUE SHIELD | Admitting: Certified Registered"

## 2017-06-03 ENCOUNTER — Inpatient Hospital Stay (HOSPITAL_COMMUNITY): Payer: BLUE CROSS/BLUE SHIELD

## 2017-06-03 ENCOUNTER — Inpatient Hospital Stay (HOSPITAL_COMMUNITY): Payer: BLUE CROSS/BLUE SHIELD | Admitting: Emergency Medicine

## 2017-06-03 ENCOUNTER — Encounter (HOSPITAL_COMMUNITY)
Admission: RE | Disposition: A | Discharge status: Home / Self Care | Payer: Self-pay | Source: Ambulatory Visit | Attending: Neurological Surgery

## 2017-06-03 ENCOUNTER — Inpatient Hospital Stay (HOSPITAL_COMMUNITY)
Admission: RE | Admit: 2017-06-03 | Discharge: 2017-06-06 | DRG: 454 | Disposition: A | Discharge status: Home / Self Care | Payer: BLUE CROSS/BLUE SHIELD | Source: Ambulatory Visit | Attending: Neurological Surgery | Admitting: Neurological Surgery

## 2017-06-03 ENCOUNTER — Encounter (HOSPITAL_COMMUNITY): Payer: Self-pay | Admitting: Certified Registered"

## 2017-06-03 DIAGNOSIS — Z981 Arthrodesis status: Secondary | ICD-10-CM

## 2017-06-03 DIAGNOSIS — D62 Acute posthemorrhagic anemia: Secondary | ICD-10-CM | POA: Diagnosis not present

## 2017-06-03 DIAGNOSIS — Z87891 Personal history of nicotine dependence: Secondary | ICD-10-CM

## 2017-06-03 DIAGNOSIS — M5417 Radiculopathy, lumbosacral region: Secondary | ICD-10-CM | POA: Diagnosis not present

## 2017-06-03 DIAGNOSIS — K219 Gastro-esophageal reflux disease without esophagitis: Secondary | ICD-10-CM | POA: Diagnosis not present

## 2017-06-03 DIAGNOSIS — M5116 Intervertebral disc disorders with radiculopathy, lumbar region: Secondary | ICD-10-CM | POA: Diagnosis not present

## 2017-06-03 DIAGNOSIS — I2510 Atherosclerotic heart disease of native coronary artery without angina pectoris: Secondary | ICD-10-CM | POA: Diagnosis present

## 2017-06-03 DIAGNOSIS — Z419 Encounter for procedure for purposes other than remedying health state, unspecified: Secondary | ICD-10-CM

## 2017-06-03 DIAGNOSIS — I25119 Atherosclerotic heart disease of native coronary artery with unspecified angina pectoris: Secondary | ICD-10-CM | POA: Diagnosis not present

## 2017-06-03 DIAGNOSIS — Z7982 Long term (current) use of aspirin: Secondary | ICD-10-CM | POA: Diagnosis not present

## 2017-06-03 DIAGNOSIS — Z79899 Other long term (current) drug therapy: Secondary | ICD-10-CM

## 2017-06-03 DIAGNOSIS — E7800 Pure hypercholesterolemia, unspecified: Secondary | ICD-10-CM | POA: Diagnosis not present

## 2017-06-03 DIAGNOSIS — I10 Essential (primary) hypertension: Secondary | ICD-10-CM | POA: Diagnosis not present

## 2017-06-03 DIAGNOSIS — F419 Anxiety disorder, unspecified: Secondary | ICD-10-CM | POA: Diagnosis present

## 2017-06-03 DIAGNOSIS — M545 Low back pain: Secondary | ICD-10-CM | POA: Diagnosis not present

## 2017-06-03 DIAGNOSIS — M48062 Spinal stenosis, lumbar region with neurogenic claudication: Secondary | ICD-10-CM | POA: Diagnosis not present

## 2017-06-03 DIAGNOSIS — M869 Osteomyelitis, unspecified: Secondary | ICD-10-CM | POA: Diagnosis not present

## 2017-06-03 DIAGNOSIS — M4726 Other spondylosis with radiculopathy, lumbar region: Secondary | ICD-10-CM | POA: Diagnosis not present

## 2017-06-03 DIAGNOSIS — M48061 Spinal stenosis, lumbar region without neurogenic claudication: Secondary | ICD-10-CM | POA: Diagnosis present

## 2017-06-03 LAB — GLUCOSE, CAPILLARY: GLUCOSE-CAPILLARY: 152 mg/dL — AB (ref 65–99)

## 2017-06-03 SURGERY — POSTERIOR LUMBAR FUSION 3 LEVEL
Anesthesia: General | Site: Back

## 2017-06-03 MED ORDER — LIDOCAINE-EPINEPHRINE 1 %-1:100000 IJ SOLN
INTRAMUSCULAR | Status: AC
  Filled 2017-06-03: qty 1

## 2017-06-03 MED ORDER — EPINEPHRINE 0.3 MG/0.3ML IJ SOAJ: 0.3000 mg | Freq: Once | INTRAMUSCULAR | Status: DC

## 2017-06-03 MED ORDER — METHOCARBAMOL 500 MG PO TABS
ORAL_TABLET | ORAL | Status: AC
  Filled 2017-06-03: qty 1

## 2017-06-03 MED ORDER — LACTATED RINGERS IV SOLN
INTRAVENOUS | Status: DC
  Administered 2017-06-03 – 2017-06-04 (×2): via INTRAVENOUS

## 2017-06-03 MED ORDER — DEXTROSE 5 % IV SOLN
INTRAVENOUS | Status: DC | PRN
  Administered 2017-06-03: 15 ug/min via INTRAVENOUS

## 2017-06-03 MED ORDER — LEVOTHYROXINE SODIUM 100 MCG PO TABS
100.0000 ug | ORAL_TABLET | Freq: Every day | ORAL | Status: DC
  Administered 2017-06-04 – 2017-06-06 (×3): 100 ug via ORAL
  Filled 2017-06-03 (×3): qty 1

## 2017-06-03 MED ORDER — HYDROCHLOROTHIAZIDE 25 MG PO TABS
25.0000 mg | ORAL_TABLET | Freq: Every day | ORAL | Status: DC
  Administered 2017-06-03 – 2017-06-06 (×3): 25 mg via ORAL
  Filled 2017-06-03 (×3): qty 1

## 2017-06-03 MED ORDER — DIPHENHYDRAMINE HCL 25 MG PO CAPS
50.0000 mg | ORAL_CAPSULE | Freq: Two times a day (BID) | ORAL | Status: DC | PRN
  Administered 2017-06-04: 50 mg via ORAL
  Filled 2017-06-03: qty 2

## 2017-06-03 MED ORDER — LACTATED RINGERS IV SOLN
INTRAVENOUS | Status: DC | PRN
  Administered 2017-06-03 (×4): via INTRAVENOUS

## 2017-06-03 MED ORDER — MIDAZOLAM HCL 5 MG/5ML IJ SOLN
INTRAMUSCULAR | Status: DC | PRN
  Administered 2017-06-03: 2 mg via INTRAVENOUS

## 2017-06-03 MED ORDER — BUPIVACAINE HCL (PF) 0.5 % IJ SOLN
INTRAMUSCULAR | Status: AC
  Filled 2017-06-03: qty 30

## 2017-06-03 MED ORDER — ACETAMINOPHEN 650 MG RE SUPP: 650.0000 mg | RECTAL | Status: DC | PRN

## 2017-06-03 MED ORDER — FENTANYL CITRATE (PF) 250 MCG/5ML IJ SOLN
INTRAMUSCULAR | Status: AC
  Filled 2017-06-03: qty 5

## 2017-06-03 MED ORDER — FLEET ENEMA 7-19 GM/118ML RE ENEM: 1.0000 | ENEMA | Freq: Once | RECTAL | Status: DC | PRN

## 2017-06-03 MED ORDER — THROMBIN (RECOMBINANT) 20000 UNITS EX SOLR
CUTANEOUS | Status: AC
  Filled 2017-06-03: qty 20000

## 2017-06-03 MED ORDER — SODIUM CHLORIDE 0.9 % IV SOLN: 250.0000 mL | INTRAVENOUS | Status: DC

## 2017-06-03 MED ORDER — POLYETHYLENE GLYCOL 3350 17 G PO PACK: 17.0000 g | PACK | Freq: Every day | ORAL | Status: DC | PRN

## 2017-06-03 MED ORDER — OXYCODONE HCL 5 MG PO TABS
ORAL_TABLET | ORAL | Status: AC
  Filled 2017-06-03: qty 1

## 2017-06-03 MED ORDER — DEXAMETHASONE SODIUM PHOSPHATE 10 MG/ML IJ SOLN
INTRAMUSCULAR | Status: DC | PRN
  Administered 2017-06-03: 10 mg via INTRAVENOUS

## 2017-06-03 MED ORDER — CHLORHEXIDINE GLUCONATE CLOTH 2 % EX PADS: 6.0000 | MEDICATED_PAD | Freq: Once | CUTANEOUS | Status: DC

## 2017-06-03 MED ORDER — MORPHINE SULFATE (PF) 4 MG/ML IV SOLN
4.0000 mg | INTRAVENOUS | Status: DC | PRN
  Administered 2017-06-03 – 2017-06-04 (×4): 4 mg via INTRAVENOUS
  Filled 2017-06-03 (×4): qty 1

## 2017-06-03 MED ORDER — PROPOFOL 10 MG/ML IV BOLUS
INTRAVENOUS | Status: DC | PRN
  Administered 2017-06-03: 120 mg via INTRAVENOUS

## 2017-06-03 MED ORDER — ATORVASTATIN CALCIUM 80 MG PO TABS
80.0000 mg | ORAL_TABLET | Freq: Every day | ORAL | Status: DC
  Administered 2017-06-03 – 2017-06-05 (×3): 80 mg via ORAL
  Filled 2017-06-03 (×3): qty 1

## 2017-06-03 MED ORDER — ACETAMINOPHEN 325 MG PO TABS
650.0000 mg | ORAL_TABLET | ORAL | Status: DC | PRN
  Administered 2017-06-04: 650 mg via ORAL
  Filled 2017-06-03: qty 2

## 2017-06-03 MED ORDER — HYDROMORPHONE HCL 1 MG/ML IJ SOLN
INTRAMUSCULAR | Status: AC
  Filled 2017-06-03: qty 1

## 2017-06-03 MED ORDER — BISACODYL 10 MG RE SUPP: 10.0000 mg | Freq: Every day | RECTAL | Status: DC | PRN

## 2017-06-03 MED ORDER — PHENOL 1.4 % MT LIQD: 1.0000 | OROMUCOSAL | Status: DC | PRN

## 2017-06-03 MED ORDER — HYDROCODONE-ACETAMINOPHEN 10-325 MG PO TABS: 1.0000 | ORAL_TABLET | ORAL | Status: DC | PRN

## 2017-06-03 MED ORDER — HYDROCODONE-ACETAMINOPHEN 10-325 MG PO TABS
2.0000 | ORAL_TABLET | ORAL | Status: DC | PRN
  Administered 2017-06-03 – 2017-06-06 (×12): 2 via ORAL
  Filled 2017-06-03 (×12): qty 2

## 2017-06-03 MED ORDER — ALBUMIN HUMAN 5 % IV SOLN
INTRAVENOUS | Status: DC | PRN
  Administered 2017-06-03: 14:00:00 via INTRAVENOUS

## 2017-06-03 MED ORDER — LIDOCAINE HCL (CARDIAC) 20 MG/ML IV SOLN
INTRAVENOUS | Status: DC | PRN
  Administered 2017-06-03: 100 mg via INTRAVENOUS

## 2017-06-03 MED ORDER — DOCUSATE SODIUM 100 MG PO CAPS
100.0000 mg | ORAL_CAPSULE | Freq: Two times a day (BID) | ORAL | Status: DC
  Administered 2017-06-03 – 2017-06-06 (×6): 100 mg via ORAL
  Filled 2017-06-03 (×6): qty 1

## 2017-06-03 MED ORDER — BUPIVACAINE HCL (PF) 0.5 % IJ SOLN
INTRAMUSCULAR | Status: DC | PRN
  Administered 2017-06-03: 10 mL

## 2017-06-03 MED ORDER — ONDANSETRON HCL 4 MG/2ML IJ SOLN
4.0000 mg | Freq: Four times a day (QID) | INTRAMUSCULAR | Status: AC | PRN
  Administered 2017-06-03: 4 mg via INTRAVENOUS

## 2017-06-03 MED ORDER — ONDANSETRON HCL 4 MG/2ML IJ SOLN
4.0000 mg | Freq: Four times a day (QID) | INTRAMUSCULAR | Status: DC | PRN
  Filled 2017-06-03 (×2): qty 2

## 2017-06-03 MED ORDER — THROMBIN (RECOMBINANT) 5000 UNITS EX SOLR
CUTANEOUS | Status: AC
  Filled 2017-06-03: qty 5000

## 2017-06-03 MED ORDER — ONDANSETRON HCL 4 MG/2ML IJ SOLN
INTRAMUSCULAR | Status: AC
  Filled 2017-06-03: qty 2

## 2017-06-03 MED ORDER — NITROGLYCERIN 0.4 MG SL SUBL: 0.4000 mg | SUBLINGUAL_TABLET | SUBLINGUAL | Status: DC | PRN

## 2017-06-03 MED ORDER — ONDANSETRON HCL 4 MG PO TABS: 4.0000 mg | ORAL_TABLET | Freq: Four times a day (QID) | ORAL | Status: DC | PRN

## 2017-06-03 MED ORDER — FENTANYL CITRATE (PF) 100 MCG/2ML IJ SOLN
INTRAMUSCULAR | Status: DC | PRN
  Administered 2017-06-03 (×4): 50 ug via INTRAVENOUS
  Administered 2017-06-03: 200 ug via INTRAVENOUS

## 2017-06-03 MED ORDER — AMLODIPINE BESYLATE 5 MG PO TABS
5.0000 mg | ORAL_TABLET | Freq: Every day | ORAL | Status: DC
  Administered 2017-06-05 – 2017-06-06 (×2): 5 mg via ORAL
  Filled 2017-06-03 (×2): qty 1

## 2017-06-03 MED ORDER — 0.9 % SODIUM CHLORIDE (POUR BTL) OPTIME
TOPICAL | Status: DC | PRN
  Administered 2017-06-03: 1000 mL

## 2017-06-03 MED ORDER — ONDANSETRON HCL 4 MG/2ML IJ SOLN
INTRAMUSCULAR | Status: DC | PRN
  Administered 2017-06-03: 4 mg via INTRAVENOUS

## 2017-06-03 MED ORDER — SENNA 8.6 MG PO TABS
1.0000 | ORAL_TABLET | Freq: Two times a day (BID) | ORAL | Status: DC
  Administered 2017-06-03 – 2017-06-06 (×6): 8.6 mg via ORAL
  Filled 2017-06-03 (×6): qty 1

## 2017-06-03 MED ORDER — LIDOCAINE-EPINEPHRINE 1 %-1:100000 IJ SOLN
INTRAMUSCULAR | Status: DC | PRN
  Administered 2017-06-03: 10 mL

## 2017-06-03 MED ORDER — DIPHENHYDRAMINE HCL 50 MG/ML IJ SOLN
INTRAMUSCULAR | Status: DC | PRN
  Administered 2017-06-03: 12.5 mg via INTRAVENOUS

## 2017-06-03 MED ORDER — CEFAZOLIN SODIUM-DEXTROSE 2-4 GM/100ML-% IV SOLN
2.0000 g | Freq: Three times a day (TID) | INTRAVENOUS | Status: AC
  Administered 2017-06-03 – 2017-06-04 (×2): 2 g via INTRAVENOUS
  Filled 2017-06-03 (×3): qty 100

## 2017-06-03 MED ORDER — DULOXETINE HCL 30 MG PO CPEP
30.0000 mg | ORAL_CAPSULE | Freq: Every day | ORAL | Status: DC
  Administered 2017-06-05 – 2017-06-06 (×2): 30 mg via ORAL
  Filled 2017-06-03 (×2): qty 1

## 2017-06-03 MED ORDER — LEVOCETIRIZINE DIHYDROCHLORIDE 5 MG PO TABS: 5.0000 mg | ORAL_TABLET | Freq: Every evening | ORAL | Status: DC

## 2017-06-03 MED ORDER — SODIUM CHLORIDE 0.9% FLUSH: 3.0000 mL | INTRAVENOUS | Status: DC | PRN

## 2017-06-03 MED ORDER — PANTOPRAZOLE SODIUM 40 MG PO TBEC
40.0000 mg | DELAYED_RELEASE_TABLET | Freq: Every day | ORAL | Status: DC
  Administered 2017-06-04 – 2017-06-06 (×3): 40 mg via ORAL
  Filled 2017-06-03 (×3): qty 1

## 2017-06-03 MED ORDER — METHOCARBAMOL 1000 MG/10ML IJ SOLN
500.0000 mg | Freq: Four times a day (QID) | INTRAMUSCULAR | Status: DC | PRN
  Filled 2017-06-03: qty 5

## 2017-06-03 MED ORDER — THROMBIN (RECOMBINANT) 5000 UNITS EX SOLR
CUTANEOUS | Status: DC | PRN
  Administered 2017-06-03 (×2): via TOPICAL

## 2017-06-03 MED ORDER — SUGAMMADEX SODIUM 200 MG/2ML IV SOLN
INTRAVENOUS | Status: DC | PRN
  Administered 2017-06-03: 200 mg via INTRAVENOUS

## 2017-06-03 MED ORDER — METHOCARBAMOL 500 MG PO TABS
500.0000 mg | ORAL_TABLET | Freq: Four times a day (QID) | ORAL | Status: DC | PRN
  Administered 2017-06-03 – 2017-06-06 (×6): 500 mg via ORAL
  Filled 2017-06-03 (×5): qty 1

## 2017-06-03 MED ORDER — PROPOFOL 10 MG/ML IV BOLUS
INTRAVENOUS | Status: AC
  Filled 2017-06-03: qty 20

## 2017-06-03 MED ORDER — ALUM & MAG HYDROXIDE-SIMETH 200-200-20 MG/5ML PO SUSP: 30.0000 mL | Freq: Four times a day (QID) | ORAL | Status: DC | PRN

## 2017-06-03 MED ORDER — LORATADINE 10 MG PO TABS
10.0000 mg | ORAL_TABLET | Freq: Every day | ORAL | Status: DC
  Administered 2017-06-04 – 2017-06-06 (×3): 10 mg via ORAL
  Filled 2017-06-03 (×3): qty 1

## 2017-06-03 MED ORDER — THROMBIN (RECOMBINANT) 20000 UNITS EX SOLR
CUTANEOUS | Status: DC | PRN
  Administered 2017-06-03 (×2): via TOPICAL

## 2017-06-03 MED ORDER — OXYCODONE HCL 5 MG PO TABS
5.0000 mg | ORAL_TABLET | Freq: Once | ORAL | Status: AC | PRN
  Administered 2017-06-03: 5 mg via ORAL

## 2017-06-03 MED ORDER — FLUTICASONE PROPIONATE 50 MCG/ACT NA SUSP
2.0000 | Freq: Every day | NASAL | Status: DC
  Administered 2017-06-03 – 2017-06-06 (×3): 2 via NASAL
  Filled 2017-06-03: qty 16

## 2017-06-03 MED ORDER — ROCURONIUM BROMIDE 100 MG/10ML IV SOLN
INTRAVENOUS | Status: DC | PRN
  Administered 2017-06-03: 50 mg via INTRAVENOUS
  Administered 2017-06-03: 30 mg via INTRAVENOUS
  Administered 2017-06-03: 20 mg via INTRAVENOUS
  Administered 2017-06-03 (×2): 10 mg via INTRAVENOUS
  Administered 2017-06-03: 20 mg via INTRAVENOUS
  Administered 2017-06-03: 30 mg via INTRAVENOUS

## 2017-06-03 MED ORDER — HYDROMORPHONE HCL 1 MG/ML IJ SOLN
0.2500 mg | INTRAMUSCULAR | Status: DC | PRN
  Administered 2017-06-03 (×4): 0.5 mg via INTRAVENOUS

## 2017-06-03 MED ORDER — SODIUM CHLORIDE 0.9 % IR SOLN
Status: DC | PRN
  Administered 2017-06-03: 09:00:00

## 2017-06-03 MED ORDER — VANCOMYCIN HCL IN DEXTROSE 1-5 GM/200ML-% IV SOLN
INTRAVENOUS | Status: AC
  Filled 2017-06-03: qty 200

## 2017-06-03 MED ORDER — EPHEDRINE SULFATE 50 MG/ML IJ SOLN
INTRAMUSCULAR | Status: DC | PRN
  Administered 2017-06-03 (×3): 5 mg via INTRAVENOUS

## 2017-06-03 MED ORDER — OXYCODONE HCL 5 MG/5ML PO SOLN: 5.0000 mg | Freq: Once | ORAL | Status: AC | PRN

## 2017-06-03 MED ORDER — DIAZEPAM 2 MG PO TABS
2.0000 mg | ORAL_TABLET | Freq: Two times a day (BID) | ORAL | Status: DC | PRN
  Administered 2017-06-04 – 2017-06-05 (×2): 2 mg via ORAL
  Filled 2017-06-03 (×2): qty 1

## 2017-06-03 MED ORDER — MENTHOL 3 MG MT LOZG: 1.0000 | LOZENGE | OROMUCOSAL | Status: DC | PRN

## 2017-06-03 MED ORDER — METOPROLOL TARTRATE 25 MG PO TABS
12.5000 mg | ORAL_TABLET | Freq: Two times a day (BID) | ORAL | Status: DC
  Administered 2017-06-03 – 2017-06-06 (×5): 12.5 mg via ORAL
  Filled 2017-06-03 (×5): qty 1

## 2017-06-03 MED ORDER — MIDAZOLAM HCL 2 MG/2ML IJ SOLN
INTRAMUSCULAR | Status: AC
  Filled 2017-06-03: qty 2

## 2017-06-03 MED ORDER — SODIUM CHLORIDE 0.9% FLUSH
3.0000 mL | Freq: Two times a day (BID) | INTRAVENOUS | Status: DC
  Administered 2017-06-03 – 2017-06-05 (×4): 3 mL via INTRAVENOUS

## 2017-06-03 MED ORDER — VANCOMYCIN HCL IN DEXTROSE 1-5 GM/200ML-% IV SOLN
1000.0000 mg | INTRAVENOUS | Status: AC
  Administered 2017-06-03: 1000 mg via INTRAVENOUS

## 2017-06-03 SURGICAL SUPPLY — 66 items
ADH SKN CLS APL DERMABOND .7 (GAUZE/BANDAGES/DRESSINGS) ×1
APL SRG 60D 8 XTD TIP BNDBL (TIP)
BAG DECANTER FOR FLEXI CONT (MISCELLANEOUS) ×2 IMPLANT
BASKET BONE COLLECTION (BASKET) ×2 IMPLANT
BLADE CLIPPER SURG (BLADE) IMPLANT
BUR MATCHSTICK NEURO 3.0 LAGG (BURR) ×2 IMPLANT
CANISTER SUCT 3000ML PPV (MISCELLANEOUS) ×2 IMPLANT
CAP LOCKING THREADED (Cap) ×8 IMPLANT
CARTRIDGE OIL MAESTRO DRILL (MISCELLANEOUS) ×1 IMPLANT
CONT SPEC 4OZ CLIKSEAL STRL BL (MISCELLANEOUS) ×2 IMPLANT
COVER BACK TABLE 60X90IN (DRAPES) ×2 IMPLANT
DECANTER SPIKE VIAL GLASS SM (MISCELLANEOUS) ×2 IMPLANT
DERMABOND ADVANCED (GAUZE/BANDAGES/DRESSINGS) ×1
DERMABOND ADVANCED .7 DNX12 (GAUZE/BANDAGES/DRESSINGS) ×1 IMPLANT
DEVICE DISSECT PLASMABLAD 3.0S (MISCELLANEOUS) ×1 IMPLANT
DIFFUSER DRILL AIR PNEUMATIC (MISCELLANEOUS) ×2 IMPLANT
DRAPE C-ARM 42X72 X-RAY (DRAPES) ×5 IMPLANT
DRAPE HALF SHEET 40X57 (DRAPES) IMPLANT
DRAPE LAPAROTOMY 100X72X124 (DRAPES) ×2 IMPLANT
DRAPE POUCH INSTRU U-SHP 10X18 (DRAPES) ×2 IMPLANT
DRSG OPSITE POSTOP 4X8 (GAUZE/BANDAGES/DRESSINGS) ×1 IMPLANT
DURAPREP 26ML APPLICATOR (WOUND CARE) ×2 IMPLANT
DURASEAL APPLICATOR TIP (TIP) IMPLANT
DURASEAL SPINE SEALANT 3ML (MISCELLANEOUS) IMPLANT
ELECT REM PT RETURN 9FT ADLT (ELECTROSURGICAL) ×2
ELECTRODE REM PT RTRN 9FT ADLT (ELECTROSURGICAL) ×1 IMPLANT
GAUZE SPONGE 4X4 12PLY STRL (GAUZE/BANDAGES/DRESSINGS) ×2 IMPLANT
GAUZE SPONGE 4X4 16PLY XRAY LF (GAUZE/BANDAGES/DRESSINGS) ×1 IMPLANT
GLOVE BIOGEL PI IND STRL 8.5 (GLOVE) ×2 IMPLANT
GLOVE BIOGEL PI INDICATOR 8.5 (GLOVE) ×2
GLOVE ECLIPSE 8.5 STRL (GLOVE) ×4 IMPLANT
GOWN STRL REUS W/ TWL LRG LVL3 (GOWN DISPOSABLE) IMPLANT
GOWN STRL REUS W/ TWL XL LVL3 (GOWN DISPOSABLE) IMPLANT
GOWN STRL REUS W/TWL 2XL LVL3 (GOWN DISPOSABLE) ×4 IMPLANT
GOWN STRL REUS W/TWL LRG LVL3 (GOWN DISPOSABLE)
GOWN STRL REUS W/TWL XL LVL3 (GOWN DISPOSABLE)
HEMOSTAT POWDER KIT SURGIFOAM (HEMOSTASIS) ×2 IMPLANT
KIT BASIN OR (CUSTOM PROCEDURE TRAY) ×2 IMPLANT
KIT ROOM TURNOVER OR (KITS) ×2 IMPLANT
MILL MEDIUM DISP (BLADE) ×2 IMPLANT
NEEDLE HYPO 22GX1.5 SAFETY (NEEDLE) ×2 IMPLANT
NS IRRIG 1000ML POUR BTL (IV SOLUTION) ×2 IMPLANT
OIL CARTRIDGE MAESTRO DRILL (MISCELLANEOUS) ×2
PACK LAMINECTOMY NEURO (CUSTOM PROCEDURE TRAY) ×2 IMPLANT
PAD ARMBOARD 7.5X6 YLW CONV (MISCELLANEOUS) ×6 IMPLANT
PATTIES SURGICAL .5 X1 (DISPOSABLE) ×4 IMPLANT
PATTIES SURGICAL 1X1 (DISPOSABLE) ×3 IMPLANT
PLASMABLADE 3.0S (MISCELLANEOUS) ×2
ROD TI CVD 5.5MMX90MM (Rod) ×2 IMPLANT
SCREW 6.5X45 (Screw) ×8 IMPLANT
SPACER SUSTAIN O SM 8X22 10M (Spacer) ×4 IMPLANT
SPACER SUSTAIN O SML 10X22 11M (Peek) ×2 IMPLANT
SPONGE LAP 4X18 X RAY DECT (DISPOSABLE) ×1 IMPLANT
SPONGE SURGIFOAM ABS GEL 100 (HEMOSTASIS) ×3 IMPLANT
STRIP BIOACTIVE 20CC 25X100X8 (Miscellaneous) ×1 IMPLANT
SUT PROLENE 6 0 BV (SUTURE) ×1 IMPLANT
SUT VIC AB 1 CT1 18XBRD ANBCTR (SUTURE) ×1 IMPLANT
SUT VIC AB 1 CT1 8-18 (SUTURE) ×2
SUT VIC AB 2-0 CP2 18 (SUTURE) ×2 IMPLANT
SUT VIC AB 3-0 SH 8-18 (SUTURE) ×2 IMPLANT
SYR 3ML LL SCALE MARK (SYRINGE) ×8 IMPLANT
TAPE CLOTH SURG 4X10 WHT LF (GAUZE/BANDAGES/DRESSINGS) ×1 IMPLANT
TOWEL GREEN STERILE (TOWEL DISPOSABLE) ×2 IMPLANT
TOWEL GREEN STERILE FF (TOWEL DISPOSABLE) ×2 IMPLANT
TRAY FOLEY W/METER SILVER 16FR (SET/KITS/TRAYS/PACK) ×2 IMPLANT
WATER STERILE IRR 1000ML POUR (IV SOLUTION) ×2 IMPLANT

## 2017-06-03 NOTE — Op Note (Signed)
Date of surgery: 06/03/2017 Preoperative diagnosis:lumbar spondylosis and stenosis L2-3 L3-4 L4-5, lumbar radiculopathy, neurogenic claudication. Postoperative diagnosis: Same Procedure: Bilateral laminectomies L2-3 L3-4 and L4-5 with decompression of the L2 L3 L4 and L5 nerve roots with more work than required for simple interbody technique. Posterior lumbar interbody arthrodesis with peek spacers local autograft and allograft L2-3 L3-4 L4-5. Pedicle screw fixation L2-L5 segmentally with posterior lateral arthrodesis using local autograft and allograft. Surgeon: Kristeen Miss First assistant: Alfredia Ferguson M.D. Anesthesia: Gen. Endotracheal Indications: Patient is a 56 year old individual who's had significant problems with back and left lower external he pain but now also right lower extremity pain. He's had previous thoracic lumbar joint fusion. He now has spondylitic degeneration with stenosis at L2-3 L3-4 and L4-5. He's been advised regarding the need for surgery.  Procedure: Patient was brought to the operating room supine on a stretcher. After the smooth induction of general endotracheal anesthesia, he was turned prone.the back was prepped with alcohol and DuraPrep. A midline incision was created in the lower lumbar spine carried down to the lumbar dorsal fascia. First spinous processes were identified as that of L3 and L4. The dissection was then undertaken subperiosteally to expose from L2 down to L5. Interlaminar spaces at L2-3 3445 were all exposed the dissection was carried out over the facet joints. Retaining retractors were used in the wound. Laminectomy was then created removing the inferior marginal lamina of L2 out to and including the entirety of the facet at L2-3. In the entire laminar arch of L3 was removed as was the entire laminar arch of L4 being cut at the pars to remove the facet complex and an en bloc fashion. The common dural tube was then carefully explored. There is one small  central rent in the dura that was oversewn with a singular 6-0 Prolene suture. With this watertight closure was obtained. Dissection was then carried out laterally to the left and to the right to decompress individually the L2 the L3 the L4 and the L5 nerve roots. This was done with a combination of high-speed drill 2 mm Kerrison punch. Once the decompression was completed and the disc spaces were isolated and discectomies were performed starting at L2-3 area the entirety of the disc was removed. The endplates were curettaged until all the articular cartilage was removed. Interspace was then sized for appropriate size spacer with a self-retaining spreader being placed in the wound. It is felt here that a 10 mm tall 8 mm wide 22 mm long 4 lordotic spacer would fit best into the interspace and this was fitted with a 6 mm of autograft. Attention was then turned to L3 for worse similar complete discectomy was completed. All along protection of the common dural tube and the neural elements was of utmost importance. Interspace was sized here and is felt that a 10 mm tall 8 mm wide for degree lordotic spacer measuring 22 mm in length would fit also. Here a total of 9 mL of bone graft was packed into the interspace. Attention was then turned to L4-5 where a similar discectomy was carried out here a 11 mm tall 10 mm wide 22 mm long for degree lordotic spacer was placed into the interspace along with a total of 12 mL of bone graft. Once the spaces were all grafted pedicle entry sites were then chosen at L2 L3 L4 and L5 using fluoroscopic visualization pedicle probe was passed through the pedicle and then a 6.5 mm tap was used to place  6.5 x 45 mm screws in each one of the pedicles left and right. A 95 mm precontoured rod was then used to connect the pedicle screws in neutral construct. Prior to doing this lateral gutters which had been packed away during the early dissection were filled with autologous bone graft and to  strips of kinex allograft. The kinex help hold the autograft in position. Once the screws were placed in the rods were contoured and tightened in a neutral construct the wound was inspected carefully and the pads of the L2 the L3 the L4 and L5 nerve root were checked to make sure that there are adequately decompressed. Hemostasis in the soft tissues was obtained meticulously. Then the lumbar dorsal fascia was closed with #1 Vicryl interrupted fashion 2-0 Vicryl was used in the subcutaneous anus tissues and 3-0 Vicryl subcuticularly. Dermabond was then placed on the incision and a honeycomb dressing was used. Blood loss was estimated at 1400 mL, 300 mL of Cell Saver blood was returned to the patient.

## 2017-06-03 NOTE — Transfer of Care (Signed)
Immediate Anesthesia Transfer of Care Note  Patient: Jeffrey Hooper  Procedure(s) Performed: L2-3 L3-4 L4-5 Posterior lumbar interbody fusion (N/A Back)  Patient Location: PACU  Anesthesia Type:General  Level of Consciousness: awake, alert  and oriented  Airway & Oxygen Therapy: Patient Spontanous Breathing and Patient connected to face mask oxygen  Post-op Assessment: Report given to RN and Post -op Vital signs reviewed and stable  Post vital signs: Reviewed and stable  Last Vitals:  Vitals:   06/03/17 0605 06/03/17 1500  BP: (!) 144/75   Pulse: (!) 45   Resp: 18   Temp: 36.8 C (P) 36.7 C  SpO2: 100%     Last Pain:  Vitals:   06/03/17 0620  TempSrc:   PainSc: 6          Complications: No apparent anesthesia complications

## 2017-06-03 NOTE — Anesthesia Preprocedure Evaluation (Addendum)
Anesthesia Evaluation  Patient identified by MRN, date of birth, ID band Patient awake    Reviewed: Allergy & Precautions, H&P , NPO status , Patient's Chart, lab work & pertinent test results  Airway Mallampati: II  TM Distance: <3 FB Neck ROM: Limited    Dental  (+) Teeth Intact, Dental Advisory Given   Pulmonary former smoker,    breath sounds clear to auscultation       Cardiovascular hypertension, + angina + CAD and + Cardiac Stents   Rhythm:regular Rate:Normal  Preserved LV function. Cleared by cardiology.   Neuro/Psych  Headaches, Anxiety  Neuromuscular disease    GI/Hepatic GERD  ,  Endo/Other  Hypothyroidism   Renal/GU      Musculoskeletal  (+) Arthritis ,   Abdominal   Peds  Hematology   Anesthesia Other Findings   Reproductive/Obstetrics                            Anesthesia Physical Anesthesia Plan  ASA: III  Anesthesia Plan: General   Post-op Pain Management:    Induction: Intravenous  PONV Risk Score and Plan: 2 and Ondansetron, Dexamethasone, Midazolam and Treatment may vary due to age or medical condition  Airway Management Planned: Oral ETT  Additional Equipment:   Intra-op Plan:   Post-operative Plan: Extubation in OR  Informed Consent: I have reviewed the patients History and Physical, chart, labs and discussed the procedure including the risks, benefits and alternatives for the proposed anesthesia with the patient or authorized representative who has indicated his/her understanding and acceptance.     Plan Discussed with: CRNA, Anesthesiologist and Surgeon  Anesthesia Plan Comments:         Anesthesia Quick Evaluation

## 2017-06-03 NOTE — Anesthesia Procedure Notes (Signed)
Procedure Name: Intubation Date/Time: 06/03/2017 7:47 AM Performed by: Gaylene Brooks Pre-anesthesia Checklist: Patient identified, Emergency Drugs available, Suction available and Patient being monitored Patient Re-evaluated:Patient Re-evaluated prior to induction Oxygen Delivery Method: Circle System Utilized Preoxygenation: Pre-oxygenation with 100% oxygen Induction Type: IV induction Ventilation: Mask ventilation without difficulty Laryngoscope Size: Miller and 2 Grade View: Grade II Tube type: Oral Tube size: 7.5 mm Number of attempts: 1 Airway Equipment and Method: Stylet and Oral airway Placement Confirmation: ETT inserted through vocal cords under direct vision,  positive ETCO2 and breath sounds checked- equal and bilateral Secured at: 2 cm Tube secured with: Tape Dental Injury: Teeth and Oropharynx as per pre-operative assessment

## 2017-06-03 NOTE — H&P (Addendum)
Chief complaint: Back and leg pain history of previous fusion.  History of present illness: [name] is a 56 year old individual who has had previous back issues in 1988 he underwent the surgical stabilization of a thoracolumbar fracture. I met him in the early 34s when he was having considerable back pain and suggested removal of his previously placed hardware. Over. Of time he gradually improved his been able to function fairly normally. Over the past 4 years he developed progressive worsening with back pain and some lower extremities this is the ages and inability to tolerate much ambulation. About 4 years ago I saw him with an MRI which demonstrated he had modest spondylitic changes most notably at L3-4 and L4-5 and I suggested further conservative efforts. I then saw him a few months ago with a recent MRI demonstrated that he now had significant spondylitic degeneration at L2-3 3445. L4-5 he has both central and lateral recess stenosis worse on left than on the right. He has a right-sided disc protrusion at that level. He has evidence of disc protrusion at L3-4 and the subligamentous space with subarticular stenosis on right side at L2-3 also has a broad-based disc protrusion that causes central canal stenosis and bilateral lateral recess stenosis for both L2 nerve roots. I recently did flexion-extension films which demonstrate that the patient has some loss of normal lordosis and loss of disc height at L2-3 3445. He also has significant ventral osteophytosis at L2-3 and L3-4. I advised that he should undergo surgical decompression and stabilization from L2-L5 and he is now being admitted for this procedure.   past medical history is notable that his general health has been good he has some hypertension. Aside from lumbar fusion surgery in 1988 and removal of hardware subsequently's had an anterior decompression arthrodesis at C6-C7 90s. Had some upper extremity decompressions of peripheral nerves  by Dr. Fredna Dow 2011 2013    current medications include amlodipine aspirin atorvastatin hydrochlorothiazide levothyroxine melatonin mometasone suspension for allergies and omeprazole. He's recently been started on meloxicam which she had taken for a number of months time but this is become ineffective. He also uses cyclobenzaprine. He notes that he has an allergy to Vibramycin. His systems review is notable for ringing in the ears some sinus problems I blood pressure high cholesterol leg pain on walking and back pain as noted in history present illness.  The patient is also noted to have an L5 gallop allergy knee keeps an EpiPen handy. Physical examination reveals that he walks with a moderate amount of antalgia involving the left lower extremity. Motor strength in major groups including the iliopsoas and quadriceps appear to be intact. Does have a slight bit of antalgia involving left lower extremity. Tibialis anterior group strength seems to be diminished. Straight leg raising is markedly positive at 15 in either lower extremity Patrick's maneuver is negative bilaterally. Sensation appears intact in lower extremities. Palpation and percussion of his back reproduces no significant pain. Cranial nerve examination in upper extremity examination is normal. Lungs are clear to auscultation heart has regular rate and rhythm the abdomen is soft bowel sounds positive no masses are palpable extremities feel no sinuses clubbing or edema.  Impression: Patient has evidence of significant spondylitic stenosis at L2-3 3445. He is now being admitted to undergo surgical decompression and stabilization from L2-L5.

## 2017-06-04 LAB — CBC
HCT: 29.5 % — ABNORMAL LOW (ref 39.0–52.0)
Hemoglobin: 10.3 g/dL — ABNORMAL LOW (ref 13.0–17.0)
MCH: 31.7 pg (ref 26.0–34.0)
MCHC: 34.9 g/dL (ref 30.0–36.0)
MCV: 90.8 fL (ref 78.0–100.0)
PLATELETS: 130 10*3/uL — AB (ref 150–400)
RBC: 3.25 MIL/uL — AB (ref 4.22–5.81)
RDW: 12.8 % (ref 11.5–15.5)
WBC: 9.4 10*3/uL (ref 4.0–10.5)

## 2017-06-04 LAB — BASIC METABOLIC PANEL
Anion gap: 6 (ref 5–15)
BUN: 12 mg/dL (ref 6–20)
CO2: 31 mmol/L (ref 22–32)
CREATININE: 1.01 mg/dL (ref 0.61–1.24)
Calcium: 8.4 mg/dL — ABNORMAL LOW (ref 8.9–10.3)
Chloride: 102 mmol/L (ref 101–111)
GFR calc Af Amer: >60 mL/min (ref >60)
GLUCOSE: 122 mg/dL — AB (ref 65–99)
POTASSIUM: 3.7 mmol/L (ref 3.5–5.1)
Sodium: 139 mmol/L (ref 135–145)

## 2017-06-04 MED ORDER — DEXAMETHASONE SODIUM PHOSPHATE 4 MG/ML IJ SOLN
4.0000 mg | Freq: Once | INTRAMUSCULAR | Status: AC
  Administered 2017-06-04: 4 mg via INTRAVENOUS
  Filled 2017-06-04: qty 1

## 2017-06-04 MED FILL — Heparin Sodium (Porcine) Inj 1000 Unit/ML: INTRAMUSCULAR | Qty: 10 | Status: AC

## 2017-06-04 MED FILL — Sodium Chloride IV Soln 0.9%: INTRAVENOUS | Qty: 1000 | Status: AC

## 2017-06-04 MED FILL — Sodium Chloride Irrigation Soln 0.9%: Qty: 3000 | Status: AC

## 2017-06-04 NOTE — Care Management Note (Signed)
Case Management Note  Patient Details  Name: Jeffrey Hooper MRN: [id number] Date of Birth: July 25, 1961  Subjective/Objective:   Pt admitted on 06/03/17 s/p L2-3 L3-4 L4-5 posterior lumbar interbody fusion.  PTA, pt independent, lives with spouse.                   Action/Plan: PT/OT consults pending.  Will follow for discharge needs as pt progresses.    Expected Discharge Date:                  Expected Discharge Plan:  [place]  In-House Referral:     Discharge planning Services  CM Consult  Post Acute Care Choice:    Choice offered to:     DME Arranged:    DME Agency:     HH Arranged:    [place] Agency:     Status of Service:  In process, will continue to follow  If discussed at Long Length of Stay Meetings, dates discussed:    Additional Comments:  Reinaldo Raddle, RN, BSN  Trauma/Neuro ICU Case Manager [phone]

## 2017-06-04 NOTE — Evaluation (Signed)
Physical Therapy Evaluation Patient Details Name: Jeffrey Hooper MRN: [id number] DOB: 04/18/1961 Today's Date: 06/04/2017   History of Present Illness  Pt is a 56 y/o male who presents s/p L2-L5 PLIF on 06/03/17.   Clinical Impression  Pt admitted with above diagnosis. Pt currently with functional limitations due to the deficits listed below (see PT Problem List). At the time of PT eval pt was able to perform transfers and ambulation with gross min guard assist progressing to supervision for safety by end of session. Pt reports feeling nauseated throughout the morning, but was able to tolerate OOB mobility well without complaints of nausea or pain. Anticipate pt will progress well with acute therapy and not require any follow-up until cleared by surgeon for outpatient PT. Pt will benefit from skilled PT to increase their independence and safety with mobility to allow discharge to the venue listed below.       Follow Up Recommendations DC plan and follow up therapy as arranged by surgeon;Outpatient PT    Equipment Recommendations  Rolling walker with 5" wheels;3in1 (PT)    Recommendations for Other Services       Precautions / Restrictions Precautions Precautions: Fall;Back Precaution Booklet Issued: No Precaution Comments: Reviewed verbally. Will need precaution sheet next session.  Required Braces or Orthoses: Spinal Brace Spinal Brace: Lumbar corset;Applied in sitting position Restrictions Weight Bearing Restrictions: No      Mobility  Bed Mobility Overal bed mobility: Needs Assistance Bed Mobility: Rolling;Sidelying to Sit Rolling: Supervision Sidelying to sit: Min guard       General bed mobility comments: Increased time and use of rails required. Pt was able to transition to EOB with close guard at the trunk to achieve full sitting position.   Transfers Overall transfer level: Needs assistance Equipment used: Rolling walker (2 wheeled) Transfers: Sit to/from  Stand Sit to Stand: Min guard         General transfer comment: VC's for hand placement on seated surface for safety. Pt was able to power-up to full standing position without assistance, however close guard required for safety.   Ambulation/Gait Ambulation/Gait assistance: Min guard;Supervision Ambulation Distance (Feet): 100 Feet Assistive device: Rolling walker (2 wheeled) Gait Pattern/deviations: Step-through pattern;Decreased stride length;Trunk flexed;Narrow base of support Gait velocity: Decreased Gait velocity interpretation: Below normal speed for age/gender General Gait Details: Pt appears guarded and required frequent VC's for improved posture and increased step/stride length. Overall no LOB and therapist provided only min guard progressing to supervision for safety by end of gait training.   Stairs            Wheelchair Mobility    Modified Rankin (Stroke Patients Only)       Balance Overall balance assessment: Needs assistance Sitting-balance support: Feet supported;No upper extremity supported Sitting balance-Leahy Scale: Good     Standing balance support: Bilateral upper extremity supported;During functional activity Standing balance-Leahy Scale: Poor Standing balance comment: Requires UE support for dynamic standing balance.                              Pertinent Vitals/Pain Pain Assessment: 0-10 Pain Score: 5  Pain Location: Incision site Pain Descriptors / Indicators: Operative site guarding Pain Intervention(s): Limited activity within patient's tolerance;Monitored during session;Repositioned    Home Living Family/patient expects to be discharged to:: Private residence Living Arrangements: Spouse/significant other Available Help at Discharge: Family Type of Home: House Home Access: Stairs to enter Entrance Stairs-Rails: None Entrance  Stairs-Number of Steps: 1 Home Layout: One level Home Equipment: None      Prior Function  Level of Independence: Independent               Hand Dominance        Extremity/Trunk Assessment   Upper Extremity Assessment Upper Extremity Assessment: Defer to OT evaluation    Lower Extremity Assessment Lower Extremity Assessment: Generalized weakness (Consistent with pre-op diagnosis)    Cervical / Trunk Assessment Cervical / Trunk Assessment: Other exceptions Cervical / Trunk Exceptions: s/p surgery  Communication   Communication: No difficulties  Cognition Arousal/Alertness: Awake/alert Behavior During Therapy: WFL for tasks assessed/performed Overall Cognitive Status: Within Functional Limits for tasks assessed                                        General Comments      Exercises     Assessment/Plan    PT Assessment Patient needs continued PT services  PT Problem List Decreased strength;Decreased range of motion;Decreased activity tolerance;Decreased balance;Decreased mobility;Decreased knowledge of use of DME;Decreased safety awareness;Decreased knowledge of precautions;Pain       PT Treatment Interventions DME instruction;Gait training;Stair training;Functional mobility training;Therapeutic exercise;Therapeutic activities;Neuromuscular re-education;Patient/family education    PT Goals (Current goals can be found in the Care Plan section)  Acute Rehab PT Goals Patient Stated Goal: Return to independence PT Goal Formulation: With patient Time For Goal Achievement: 06/11/17 Potential to Achieve Goals: Good    Frequency Min 5X/week   Barriers to discharge        Co-evaluation               AM-PAC PT "6 Clicks" Daily Activity  Outcome Measure Difficulty turning over in bed (including adjusting bedclothes, sheets and blankets)?: Unable Difficulty moving from lying on back to sitting on the side of the bed? : Unable Difficulty sitting down on and standing up from a chair with arms (e.g., wheelchair, bedside commode,  etc,.)?: Unable Help needed moving to and from a bed to chair (including a wheelchair)?: A Little Help needed walking in hospital room?: A Little Help needed climbing 3-5 steps with a railing? : A Little 6 Click Score: 12    End of Session Equipment Utilized During Treatment: Gait belt Activity Tolerance: Patient tolerated treatment well Patient left: in chair;with call bell/phone within reach Nurse Communication: Mobility status PT Visit Diagnosis: Unsteadiness on feet (R26.81);Pain;Other symptoms and signs involving the nervous system (R29.898) Pain - part of body:  (back)    Time: 3546-5681 PT Time Calculation (min) (ACUTE ONLY): 33 min   Charges:   PT Evaluation $PT Eval Moderate Complexity: 1 Mod PT Treatments $Gait Training: 8-22 mins   PT G Codes:        Rolinda Roan, PT, DPT Acute Rehabilitation Services Pager: [phone]   Thelma Comp 06/04/2017, 2:43 PM

## 2017-06-04 NOTE — Progress Notes (Signed)
Vital signs are stable Dressing is dry Motor function appears good Postoperative hemoglobin and electrolytes are within limits of normal hemoglobin is 11 which is stable considering amount of blood loss Encourage mobilization Patient has had chronic nausea May be slight reaction to heparin We'll continue Decadron

## 2017-06-04 NOTE — Progress Notes (Signed)
Orthopedic Tech Progress Note Patient Details:  Jeffrey Hooper [birth year] [id number]  Patient ID: Jeffrey Hooper, male   DOB: 24-Mar-1961, 56 y.o.   MRN: [id number]   [name] 06/04/2017, 11:51 AM [name] for Lumbar brace.

## 2017-06-04 NOTE — Anesthesia Postprocedure Evaluation (Signed)
Anesthesia Post Note  Patient: Jeffrey Hooper  Procedure(s) Performed: L2-3 L3-4 L4-5 Posterior lumbar interbody fusion (N/A Back)     Patient location during evaluation: PACU Anesthesia Type: General Level of consciousness: awake and alert Pain management: pain level controlled Vital Signs Assessment: post-procedure vital signs reviewed and stable Respiratory status: spontaneous breathing, nonlabored ventilation, respiratory function stable and patient connected to nasal cannula oxygen Cardiovascular status: blood pressure returned to baseline and stable Postop Assessment: no apparent nausea or vomiting Anesthetic complications: no    Last Vitals:  Vitals:   06/04/17 0414 06/04/17 0700  BP: 111/64   Pulse: 87   Resp: 18   Temp: 37.2 C 37.1 C  SpO2: 98%     Last Pain:  Vitals:   06/04/17 0641  TempSrc:   PainSc: [place]

## 2017-06-05 DIAGNOSIS — Z0289 Encounter for other administrative examinations: Secondary | ICD-10-CM

## 2017-06-05 LAB — POCT I-STAT 4, (NA,K, GLUC, HGB,HCT)
Glucose, Bld: 151 mg/dL — ABNORMAL HIGH (ref 65–99)
HEMATOCRIT: 33 % — AB (ref 39.0–52.0)
Hemoglobin: 11.2 g/dL — ABNORMAL LOW (ref 13.0–17.0)
Potassium: 4 mmol/L (ref 3.5–5.1)
SODIUM: 138 mmol/L (ref 135–145)

## 2017-06-05 MED ORDER — DEXAMETHASONE SODIUM PHOSPHATE 4 MG/ML IJ SOLN
4.0000 mg | Freq: Once | INTRAMUSCULAR | Status: AC
  Administered 2017-06-05: 4 mg via INTRAVENOUS
  Filled 2017-06-05: qty 1

## 2017-06-05 MED ORDER — MAGNESIUM HYDROXIDE 400 MG/5ML PO SUSP
30.0000 mL | Freq: Once | ORAL | Status: AC
  Administered 2017-06-05: 30 mL via ORAL
  Filled 2017-06-05: qty 30

## 2017-06-05 NOTE — Progress Notes (Signed)
Physical Therapy Treatment Patient Details Name: Jeffrey Hooper MRN: [id number] DOB: 1961/06/15 Today's Date: 06/05/2017    History of Present Illness Pt is a 56 y/o male who presents s/p L2-L5 PLIF on 06/03/17.     PT Comments    Pt very pleasant and eager to progress mobility. Pt educated for all precautions with handout provided. Pt with increased ability with transfers and able to perform long hall ambulation and stairs. Will continue to follow and educated for sitting short periods of time. Pt able to don brace without assist.    Follow Up Recommendations  DC plan and follow up therapy as arranged by surgeon;Outpatient PT     Equipment Recommendations  Rolling walker with 5" wheels;3in1 (PT)    Recommendations for Other Services       Precautions / Restrictions Precautions Precautions: Fall;Back Precaution Booklet Issued: Yes (comment) Precaution Comments: pt able to recall all precautions with handout provided and discussed Required Braces or Orthoses: Spinal Brace Spinal Brace: Lumbar corset;Applied in sitting position Restrictions Weight Bearing Restrictions: No    Mobility  Bed Mobility Overal bed mobility: Needs Assistance Bed Mobility: Rolling;Sidelying to Sit Rolling: Supervision Sidelying to sit: Supervision       General bed mobility comments: bed flat, no rails, cues for sequence  Transfers Overall transfer level: Needs assistance   Transfers: Sit to/from Stand Sit to Stand: Supervision         General transfer comment: cues for posture  Ambulation/Gait Ambulation/Gait assistance: Supervision Ambulation Distance (Feet): 250 Feet Assistive device: Rolling walker (2 wheeled) Gait Pattern/deviations: Step-through pattern;Decreased stride length   Gait velocity interpretation: Below normal speed for age/gender General Gait Details: cues for increased stride, Pt walked last 5' without RW   Stairs Stairs: Yes   Stair Management:  Forwards;Step to pattern Number of Stairs: 2 General stair comments: hand held assist with cues for sequence and assist for balance  Wheelchair Mobility    Modified Rankin (Stroke Patients Only)       Balance                                            Cognition Arousal/Alertness: Awake/alert Behavior During Therapy: WFL for tasks assessed/performed Overall Cognitive Status: Within Functional Limits for tasks assessed                                        Exercises      General Comments        Pertinent Vitals/Pain Pain Score: 5  Pain Location: back and left buttock Pain Descriptors / Indicators: Operative site guarding Pain Intervention(s): Limited activity within patient's tolerance;Repositioned;Premedicated before session    Home Living                      Prior Function            PT Goals (current goals can now be found in the care plan section) Progress towards PT goals: Progressing toward goals    Frequency    Min 5X/week      PT Plan Current plan remains appropriate    Co-evaluation              AM-PAC PT "6 Clicks" Daily Activity  Outcome Measure  Difficulty turning over in  bed (including adjusting bedclothes, sheets and blankets)?: A Little Difficulty moving from lying on back to sitting on the side of the bed? : A Little Difficulty sitting down on and standing up from a chair with arms (e.g., wheelchair, bedside commode, etc,.)?: A Little Help needed moving to and from a bed to chair (including a wheelchair)?: A Little Help needed walking in hospital room?: A Little Help needed climbing 3-5 steps with a railing? : A Little 6 Click Score: 18    End of Session Equipment Utilized During Treatment: Back brace Activity Tolerance: Patient tolerated treatment well Patient left: in chair;with call bell/phone within reach Nurse Communication: Mobility status;Precautions PT Visit Diagnosis:  Other abnormalities of gait and mobility (R26.89)     Time: 1194-1740 PT Time Calculation (min) (ACUTE ONLY): 27 min  Charges:  $Gait Training: 8-22 mins $Therapeutic Activity: 8-22 mins                    G Codes:       Elwyn Reach, PT [place] 06/05/2017, 9:02 AM

## 2017-06-05 NOTE — Progress Notes (Signed)
Vital signs stable  Dressing dry  Doing fair mobilizing well stable

## 2017-06-05 NOTE — Evaluation (Signed)
Occupational Therapy Evaluation Patient Details Name: Jeffrey Hooper MRN: [id number] DOB: [birth year] Today's Date: 06/05/2017    History of Present Illness Pt is a 56 y/o male who presents s/p L2-L5 PLIF on 06/03/17.    Clinical Impression   Pt reports he was independent with ADL PTA. Currently pt overall supervision for ADL and functional mobility with the exception of min assist for LB ADL. All back, safety, and ADL education completed with pt. Pt planning to d/c home with supervision from family. Pt would benefit from continued skilled OT to address established goals.     Follow Up Recommendations  No OT follow up;Supervision - Intermittent    Equipment Recommendations  3 in 1 bedside commode    Recommendations for Other Services       Precautions / Restrictions Precautions Precautions: Fall;Back Precaution Booklet Issued: No Precaution Comments: able to recall 3/3 precautions Required Braces or Orthoses: Spinal Brace Spinal Brace: Lumbar corset;Applied in sitting position Restrictions Weight Bearing Restrictions: No      Mobility Bed Mobility               General bed mobility comments: Pt OOB in chair upon arrival  Transfers Overall transfer level: Needs assistance Equipment used: Rolling walker (2 wheeled) Transfers: Sit to/from Stand Sit to Stand: Supervision         General transfer comment: for safety. good hand placement and technique with RW    Balance Overall balance assessment: Needs assistance Sitting-balance support: Feet supported;No upper extremity supported Sitting balance-Leahy Scale: Good     Standing balance support: No upper extremity supported;During functional activity Standing balance-Leahy Scale: Fair Standing balance comment: Able to adjust back brace in standing without UE support                           ADL either performed or assessed with clinical judgement   ADL Overall ADL's : Needs  assistance/impaired Eating/Feeding: Set up;Sitting   Grooming: Supervision/safety;Standing   Upper Body Bathing: Set up;Supervision/ safety;Sitting   Lower Body Bathing: Minimal assistance;Sit to/from stand   Upper Body Dressing : Set up;Supervision/safety;Sitting   Lower Body Dressing: Minimal assistance;Sit to/from stand Lower Body Dressing Details (indicate cue type and reason): Pt able to cross foot over opposite knee with assist. Reports wife can assist as needed Toilet Transfer: Supervision/safety;Ambulation;BSC;RW Toilet Transfer Details (indicate cue type and reason): Simulated by sit to stand from chair with functional mobility   Toileting - Clothing Manipulation Details (indicate cue type and reason): Educated on no twsiting during peri care, use of wet wipes   Tub/Shower Transfer Details (indicate cue type and reason): Educated on use of shower seat for safety initially with bathing Functional mobility during ADLs: Supervision/safety;Rolling walker General ADL Comments: Educated pt on maintaining back precuations during functional activities, keeping frequently used items at counter top height, frequent mobility throughout the day upon return home     Vision         Perception     Praxis      Pertinent Vitals/Pain Pain Assessment: 0-10 Pain Score: 5  Pain Location: back Pain Descriptors / Indicators: Aching;Sore Pain Intervention(s): Monitored during session     Hand Dominance     Extremity/Trunk Assessment Upper Extremity Assessment Upper Extremity Assessment: Overall WFL for tasks assessed   Lower Extremity Assessment Lower Extremity Assessment: Defer to PT evaluation   Cervical / Trunk Assessment Cervical / Trunk Assessment: Other exceptions Cervical / Trunk Exceptions: s/p  spinal surgery   Communication Communication Communication: No difficulties   Cognition Arousal/Alertness: Awake/alert Behavior During Therapy: WFL for tasks  assessed/performed Overall Cognitive Status: Within Functional Limits for tasks assessed                                     General Comments       Exercises     Shoulder Instructions      Home Living Family/patient expects to be discharged to:: Private residence Living Arrangements: Spouse/significant other Available Help at Discharge: Family Type of Home: House Home Access: Stairs to enter Technical brewer of Steps: 1 Entrance Stairs-Rails: None Home Layout: One level     Bathroom Shower/Tub: Occupational psychologist: [place]: [place] in          Prior Functioning/Environment Level of Independence: Independent                 OT Problem List: Decreased strength;Decreased activity tolerance;Impaired balance (sitting and/or standing);Decreased knowledge of use of DME or AE;Decreased knowledge of precautions;Pain      OT Treatment/Interventions: Self-care/ADL training;Energy conservation;DME and/or AE instruction;Therapeutic activities;Patient/family education;Balance training    OT Goals(Current goals can be found in the care plan section) Acute Rehab OT Goals Patient Stated Goal: Return to independence OT Goal Formulation: With patient Time For Goal Achievement: 06/19/17 Potential to Achieve Goals: Good ADL Goals Pt Will Perform Lower Body Bathing: with supervision;sit to/from stand Pt Will Perform Lower Body Dressing: with supervision;sit to/from stand Pt Will Transfer to Toilet: with supervision;ambulating;bedside commode Pt Will Perform Toileting - Clothing Manipulation and hygiene: with supervision;sit to/from stand Pt Will Perform Tub/Shower Transfer: Shower transfer;with supervision;ambulating;3 in 1;rolling walker Additional ADL Goal #1: Pt will independently verbally recall 3/3 back precuations and maintain throughout ADL. Additional ADL Goal #2: Pt will don/doff back brace with set up as  precursor to ADL.  OT Frequency: Min 2X/week   Barriers to D/C:            Co-evaluation              AM-PAC PT "6 Clicks" Daily Activity     Outcome Measure Help from another person eating meals?: None Help from another person taking care of personal grooming?: A Little Help from another person toileting, which includes using toliet, bedpan, or urinal?: A Little Help from another person bathing (including washing, rinsing, drying)?: A Little Help from another person to put on and taking off regular upper body clothing?: None Help from another person to put on and taking off regular lower body clothing?: A Little 6 Click Score: 20   End of Session Equipment Utilized During Treatment: Rolling walker;Back brace Nurse Communication: Mobility status  Activity Tolerance: Patient tolerated treatment well Patient left: in chair;with call bell/phone within reach  OT Visit Diagnosis: Unsteadiness on feet (R26.81);Other abnormalities of gait and mobility (R26.89);Pain Pain - part of body:  (back)                Time: 7628-3151 OT Time Calculation (min): 18 min Charges:  OT General Charges $OT Visit: 1 Visit OT Evaluation $OT Eval Moderate Complexity: 1 Mod G-Codes:     Zeda Gangwer A. Ulice Brilliant, M.S., OTR/L Pager: [place] 06/05/2017, 3:13 PM

## 2017-06-06 MED ORDER — METHOCARBAMOL 500 MG PO TABS: 500.0000 mg | ORAL_TABLET | Freq: Four times a day (QID) | ORAL | 3 refills | Status: DC | PRN

## 2017-06-06 MED ORDER — HYDROCODONE-ACETAMINOPHEN 10-325 MG PO TABS: 1.0000 | ORAL_TABLET | ORAL | 0 refills | Status: DC | PRN

## 2017-06-06 MED ORDER — DEXAMETHASONE 1 MG PO TABS: ORAL_TABLET | ORAL | 0 refills | Status: AC

## 2017-06-06 NOTE — Progress Notes (Signed)
Occupational Therapy Treatment Patient Details Name: Jeffrey Hooper MRN: [id number] DOB: 1960-08-21 Today's Date: 06/06/2017    History of present illness Pt is a 56 y/o male who presents s/p L2-L5 PLIF on 06/03/17.    OT comments  Pt able to perform UB dressing including brace management with set up, min assist for LB dressing today. Pt required supervision for functional mobility including simulated shower transfer in room. Pt able to verbally recall 3/3 back precautions and maintain throughout all functional activities this session. D/c plan remains appropriate. Will continue to follow acutely.   Follow Up Recommendations  No OT follow up;Supervision - Intermittent    Equipment Recommendations  3 in 1 bedside commode    Recommendations for Other Services      Precautions / Restrictions Precautions Precautions: Fall;Back Precaution Booklet Issued: No Precaution Comments: able to recall 3/3 precautions Required Braces or Orthoses: Spinal Brace Spinal Brace: Lumbar corset;Applied in sitting position Restrictions Weight Bearing Restrictions: No       Mobility Bed Mobility         General bed mobility comments: Pt OOB in chair upon arrival  Transfers Overall transfer level: Needs assistance Equipment used: None Transfers: Sit to/from Stand Sit to Stand: Supervision         General transfer comment: for safety, no physical assist    Balance Overall balance assessment: Needs assistance Sitting-balance support: No upper extremity supported;Feet supported Sitting balance-Leahy Scale: Good     Standing balance support: No upper extremity supported;During functional activity Standing balance-Leahy Scale: Good                             ADL either performed or assessed with clinical judgement   ADL Overall ADL's : Needs assistance/impaired                 Upper Body Dressing : Set up;Sitting Upper Body Dressing Details (indicate cue type  and reason): for shirt and brace Lower Body Dressing: Minimal assistance;Sit to/from stand Lower Body Dressing Details (indicate cue type and reason): to start clothing over feet due to sticky socks. Wife to assist as needed Toilet Transfer: Supervision/safety;Ambulation Toilet Transfer Details (indicate cue type and reason): Simulated by sit to stand from chair with funcitonal mobility     Tub/ Shower Transfer: Supervision/safety;Walk-in shower;Ambulation Tub/Shower Transfer Details (indicate cue type and reason): Educated pt on walk in shower transfer; pt able to return demo simulation in room. Recommend supervision for safety with shower transfer upon return home for safety Functional mobility during ADLs: Supervision/safety General ADL Comments: Pt able to maintain back precautions thorughout functional activities this session     Vision       Perception     Praxis      Cognition Arousal/Alertness: Awake/alert Behavior During Therapy: WFL for tasks assessed/performed Overall Cognitive Status: Within Functional Limits for tasks assessed                                          Exercises    Shoulder Instructions       General Comments      Pertinent Vitals/ Pain       Pain Assessment: 0-10 Pain Score: 5  Pain Location: back, L hip Pain Descriptors / Indicators: Discomfort;Sore Pain Intervention(s): Monitored during session  Home Living  Prior Functioning/Environment              Frequency  Min 2X/week        Progress Toward Goals  OT Goals(current goals can now be found in the care plan section)  Progress towards OT goals: Progressing toward goals  Acute Rehab OT Goals Patient Stated Goal: Return to independence OT Goal Formulation: With patient  Plan Discharge plan remains appropriate    Co-evaluation                 AM-PAC PT "6 Clicks" Daily Activity      Outcome Measure   Help from another person eating meals?: None Help from another person taking care of personal grooming?: None Help from another person toileting, which includes using toliet, bedpan, or urinal?: A Little Help from another person bathing (including washing, rinsing, drying)?: A Little Help from another person to put on and taking off regular upper body clothing?: None Help from another person to put on and taking off regular lower body clothing?: A Little 6 Click Score: 21    End of Session Equipment Utilized During Treatment: Back brace  OT Visit Diagnosis: Unsteadiness on feet (R26.81);Other abnormalities of gait and mobility (R26.89);Pain Pain - part of body:  (back)   Activity Tolerance Patient tolerated treatment well   Patient Left in chair;with call bell/phone within reach   Nurse Communication          Time: 4496-7591 OT Time Calculation (min): 16 min  Charges: OT General Charges $OT Visit: 1 Visit OT Treatments $Self Care/Home Management : 8-22 mins  Willett Lefeber A. Ulice Brilliant, M.S., OTR/L Pager: [place] 06/06/2017, 11:03 AM

## 2017-06-06 NOTE — Care Management Note (Addendum)
Case Management Note  Patient Details  Name: Jeffrey Hooper MRN: [id number] Date of Birth: 09/25/1960  Subjective/Objective:   Pt admitted on 06/03/17 s/p L2-3 L3-4 L4-5 posterior lumbar interbody fusion.  PTA, pt independent, lives with spouse.                   Action/Plan: PT/OT consults pending.  Will follow for discharge needs as pt progresses.    Expected Discharge Date:  06/06/17               Expected Discharge Plan:  Home/Self Care  In-House Referral:     Discharge planning Services  CM Consult  Post Acute Care Choice:  Durable Medical Equipment Choice offered to:  Patient  DME Arranged:  [place] Rile rolling DME Agency:  [place]:    [place]:     Status of Service:  Completed, signed off  If discussed at [place] of Stay Meetings, dates discussed:    Additional Comments:  06/06/17 J. Jonpaul Lumm, RN, BSN Pt medically stable for discharge home today with wife.  PT/OT recommending no OP follow up, DME for home.  Pt declines 3 in 1 BSC.   Referral to [facility] for DME needs.  RW to be delivered to pt's room prior to dc.    Reinaldo Raddle, RN, BSN  Trauma/Neuro ICU Case Manager [phone]

## 2017-06-06 NOTE — Progress Notes (Signed)
Physical Therapy Treatment Patient Details Name: Jeffrey Hooper MRN: [id number] DOB: Apr 25, 1961 Today's Date: 06/06/2017    History of Present Illness Pt is a 56 y/o male who presents s/p L2-L5 PLIF on 06/03/17.     PT Comments    Pt very pleasant and moving well. Pt apprehensive about mobility but moving well and required cues for increased function. Pt aware of precautions and able to follow. Pt safe for D/C   Follow Up Recommendations  No PT follow up     Equipment Recommendations  Rolling walker with 5" wheels;3in1 (PT)    Recommendations for Other Services       Precautions / Restrictions Precautions Precautions: Fall;Back Precaution Comments: able to recall 3/3 precautions Required Braces or Orthoses: Spinal Brace Spinal Brace: Lumbar corset;Applied in sitting position Restrictions Weight Bearing Restrictions: No    Mobility  Bed Mobility Overal bed mobility: Modified Independent Bed Mobility: Sidelying to Sit;Sit to Sidelying              Transfers Overall transfer level: Modified independent                  Ambulation/Gait Ambulation/Gait assistance: Supervision Ambulation Distance (Feet): 425 Feet Assistive device: None Gait Pattern/deviations: Step-through pattern;Decreased stride length   Gait velocity interpretation: Below normal speed for age/gender General Gait Details: cues for increased stride and arm swing   Stairs            Wheelchair Mobility    Modified Rankin (Stroke Patients Only)       Balance                                            Cognition Arousal/Alertness: Awake/alert Behavior During Therapy: WFL for tasks assessed/performed Overall Cognitive Status: Within Functional Limits for tasks assessed                                        Exercises General Exercises - Lower Extremity Long Arc Quad: AROM;Both;Seated;10 reps Hip Flexion/Marching: AROM;Both;Seated;10  reps    General Comments        Pertinent Vitals/Pain Pain Assessment: No/denies pain    Home Living                      Prior Function            PT Goals (current goals can now be found in the care plan section) Progress towards PT goals: Progressing toward goals    Frequency    Min 5X/week      PT Plan Current plan remains appropriate    Co-evaluation              AM-PAC PT "6 Clicks" Daily Activity  Outcome Measure  Difficulty turning over in bed (including adjusting bedclothes, sheets and blankets)?: None Difficulty moving from lying on back to sitting on the side of the bed? : A Little Difficulty sitting down on and standing up from a chair with arms (e.g., wheelchair, bedside commode, etc,.)?: A Little Help needed moving to and from a bed to chair (including a wheelchair)?: None Help needed walking in hospital room?: A Little Help needed climbing 3-5 steps with a railing? : A Little 6 Click Score: 20    End of Session Equipment  Utilized During Treatment: Back brace Activity Tolerance: Patient tolerated treatment well Patient left: in chair;with call bell/phone within reach Nurse Communication: Mobility status;Precautions PT Visit Diagnosis: Other abnormalities of gait and mobility (R26.89)     Time: 2641-5830 PT Time Calculation (min) (ACUTE ONLY): 20 min  Charges:  $Gait Training: 8-22 mins                    G Codes:       Elwyn Reach, [place]    [place] 06/06/2017, 9:35 AM

## 2017-06-06 NOTE — Discharge Summary (Signed)
Date of admission: 06/03/2017 Date of discharge: 06/06/2017 Admitting diagnosis: Lumbar spondylosis and stenosis with radiculopathy and neurogenic claudication L2-3 L3-4 L4-5. Discharge and final diagnosis: Lumbar spondylosis and stenosis with radiculopathy and neurogenic claudication, L2-3 L3-4 L4-5. Acute blood loss anemia. History of decompression and fusion of thoracolumbar fracture. Condition on discharge: Improved Hospital course: Patient was admitted to undergo surgical decompression and stabilization from L2-L5. He tolerated surgery well. Postoperatively his hemoglobin decreased to 10.9 however this 3 g drop in hemoglobin was not sufficient to require transfusion. This was secondary to acute blood loss anemia from surgery.  Discharge medications: Hydrocodone 05/08/2024 #60 no refills Methocarbamol 500 mg #43 refills Decadron taper 1 mg #15 tablets no refills.

## 2017-06-06 NOTE — Progress Notes (Signed)
Patient d/c instructions given, questions answered. Prescriptions given to patient.  IV removed. Patient taken via wheelchair to car with son.

## 2017-06-20 DIAGNOSIS — M4727 Other spondylosis with radiculopathy, lumbosacral region: Secondary | ICD-10-CM | POA: Diagnosis not present

## 2017-07-03 ENCOUNTER — Other Ambulatory Visit: Payer: Self-pay | Admitting: Pediatrics

## 2017-07-03 DIAGNOSIS — L509 Urticaria, unspecified: Secondary | ICD-10-CM

## 2017-07-03 DIAGNOSIS — T7840XA Allergy, unspecified, initial encounter: Secondary | ICD-10-CM

## 2017-07-03 MED ORDER — EPINEPHRINE 0.3 MG/0.3ML IJ SOAJ: 0.3000 mg | Freq: Once | INTRAMUSCULAR | 1 refills | Status: AC

## 2017-07-03 NOTE — Telephone Encounter (Signed)
What is the name of the medication? Epinephrine 0.3 mg/0.3 ml [facility] sent over a refill request last week and hadn't been filled  Have you contacted your pharmacy to request a refill? YES  Which pharmacy would you like this sent to? [facility] [place]   Patient notified that their request is being sent to the clinical staff for review and that they should receive a call once it is complete. If they do not receive a call within 24 hours they can check with their pharmacy or our office.

## 2017-07-05 ENCOUNTER — Emergency Department (HOSPITAL_COMMUNITY)
Admission: EM | Admit: 2017-07-05 | Discharge: 2017-07-05 | Disposition: A | Discharge status: Home / Self Care | Payer: BLUE CROSS/BLUE SHIELD | Attending: Emergency Medicine | Admitting: Emergency Medicine

## 2017-07-05 ENCOUNTER — Emergency Department (HOSPITAL_COMMUNITY): Payer: BLUE CROSS/BLUE SHIELD

## 2017-07-05 ENCOUNTER — Encounter (HOSPITAL_COMMUNITY): Payer: Self-pay | Admitting: *Deleted

## 2017-07-05 ENCOUNTER — Emergency Department (HOSPITAL_BASED_OUTPATIENT_CLINIC_OR_DEPARTMENT_OTHER)
Admit: 2017-07-05 | Discharge: 2017-07-05 | Disposition: A | Discharge status: Home / Self Care | Payer: BLUE CROSS/BLUE SHIELD | Attending: Emergency Medicine | Admitting: Emergency Medicine

## 2017-07-05 DIAGNOSIS — Z87891 Personal history of nicotine dependence: Secondary | ICD-10-CM | POA: Diagnosis not present

## 2017-07-05 DIAGNOSIS — M79662 Pain in left lower leg: Secondary | ICD-10-CM | POA: Diagnosis not present

## 2017-07-05 DIAGNOSIS — E039 Hypothyroidism, unspecified: Secondary | ICD-10-CM | POA: Insufficient documentation

## 2017-07-05 DIAGNOSIS — N189 Chronic kidney disease, unspecified: Secondary | ICD-10-CM | POA: Diagnosis not present

## 2017-07-05 DIAGNOSIS — R9431 Abnormal electrocardiogram [ECG] [EKG]: Secondary | ICD-10-CM | POA: Diagnosis not present

## 2017-07-05 DIAGNOSIS — M7989 Other specified soft tissue disorders: Secondary | ICD-10-CM | POA: Insufficient documentation

## 2017-07-05 DIAGNOSIS — R079 Chest pain, unspecified: Secondary | ICD-10-CM | POA: Diagnosis not present

## 2017-07-05 DIAGNOSIS — Z79899 Other long term (current) drug therapy: Secondary | ICD-10-CM | POA: Insufficient documentation

## 2017-07-05 DIAGNOSIS — Z955 Presence of coronary angioplasty implant and graft: Secondary | ICD-10-CM | POA: Insufficient documentation

## 2017-07-05 DIAGNOSIS — I259 Chronic ischemic heart disease, unspecified: Secondary | ICD-10-CM | POA: Insufficient documentation

## 2017-07-05 DIAGNOSIS — M79605 Pain in left leg: Secondary | ICD-10-CM

## 2017-07-05 DIAGNOSIS — M79661 Pain in right lower leg: Secondary | ICD-10-CM | POA: Insufficient documentation

## 2017-07-05 DIAGNOSIS — M79604 Pain in right leg: Secondary | ICD-10-CM

## 2017-07-05 DIAGNOSIS — R2243 Localized swelling, mass and lump, lower limb, bilateral: Secondary | ICD-10-CM | POA: Diagnosis not present

## 2017-07-05 DIAGNOSIS — I129 Hypertensive chronic kidney disease with stage 1 through stage 4 chronic kidney disease, or unspecified chronic kidney disease: Secondary | ICD-10-CM | POA: Diagnosis not present

## 2017-07-05 DIAGNOSIS — R609 Edema, unspecified: Secondary | ICD-10-CM | POA: Diagnosis not present

## 2017-07-05 LAB — I-STAT TROPONIN, ED: TROPONIN I, POC: 0.01 ng/mL (ref 0.00–0.08)

## 2017-07-05 LAB — CBC
HEMATOCRIT: 35.9 % — AB (ref 39.0–52.0)
HEMOGLOBIN: 11.8 g/dL — AB (ref 13.0–17.0)
MCH: 30.1 pg (ref 26.0–34.0)
MCHC: 32.9 g/dL (ref 30.0–36.0)
MCV: 91.6 fL (ref 78.0–100.0)
Platelets: 292 10*3/uL (ref 150–400)
RBC: 3.92 MIL/uL — AB (ref 4.22–5.81)
RDW: 12.9 % (ref 11.5–15.5)
WBC: 6.9 10*3/uL (ref 4.0–10.5)

## 2017-07-05 LAB — TROPONIN I

## 2017-07-05 LAB — BASIC METABOLIC PANEL
ANION GAP: 10 (ref 5–15)
BUN: 7 mg/dL (ref 6–20)
CALCIUM: 9.1 mg/dL (ref 8.9–10.3)
CO2: 25 mmol/L (ref 22–32)
Chloride: 103 mmol/L (ref 101–111)
Creatinine, Ser: 0.98 mg/dL (ref 0.61–1.24)
GLUCOSE: 94 mg/dL (ref 65–99)
POTASSIUM: 3.5 mmol/L (ref 3.5–5.1)
Sodium: 138 mmol/L (ref 135–145)

## 2017-07-05 LAB — PROTIME-INR
INR: 0.95
PROTHROMBIN TIME: 12.6 s (ref 11.4–15.2)

## 2017-07-05 NOTE — ED Notes (Signed)
Pt discussed with dr Billy Fischer  Orders for doppler studies

## 2017-07-05 NOTE — ED Notes (Signed)
Patient given discharge instructions and verbalized understanding.  Patient stable to discharge at this time.  Patient is alert and oriented to baseline.  No distressed noted at this time.  All belongings taken with the patient at discharge.   

## 2017-07-05 NOTE — Progress Notes (Signed)
LE venous duplex prelim: negative for DVT. [place] Ruddy Eunice, RDMS, RVT  

## 2017-07-05 NOTE — ED Provider Notes (Signed)
[facility]PART[facility] Provider Note   CSN: [id number] Arrival date & time: 07/05/17  1507     History   Chief Complaint Chief Complaint  Patient presents with  . Leg Swelling    HPI Jeffrey Hooper is a 56 y.o. male.  HPI Patient has spinal fusion October 29.  For 3-4 days his noting swelling in his legs and burning pain.  Swelling improves with elevating.  He contacted his physician and was referred to the emergency department to rule out DVT.  She denies shortness of breath.  She reports ever since his stents he has had periodic chest pain.  No worse than usual.  He follows up with his cardiologist regularly.  No fevers no chills.  Abdominal pain. Past Medical History:  Diagnosis Date  . Allergy   . Anginal pain ([place])   . Anxiety   . Arthritis   . CAD (coronary artery disease), native coronary artery 12/03/2015  . Coronary artery disease   . Dysrhythmia    "irregular and PAC'S"  . GERD (gastroesophageal reflux disease)   . Headache   . History of kidney stones   . Hyperlipidemia   . Hypertension   . Hypothyroid   . Multiple benign nevi   . Neuromuscular disorder ([place]) 2011   LEFT RADIAL NERVE SURGERY R/T TRAUMA  . Pneumonia 2014, 2017  . Prostatism   . Pulmonary nodule   . Seasonal allergies     Patient Active Problem List   Diagnosis Date Noted  . Spinal stenosis of lumbar region 06/03/2017  . Bradycardia 05/21/2017  . Allergic reaction 04/03/2016  . Alpha-gal hypersensitivity 04/03/2016  . Drug reaction 12/14/2015  . Renal insufficiency 12/14/2015  . Anxiety 12/14/2015  . CAD S/P percutaneous coronary angioplasty 12/03/2015  . Abnormal nuclear stress test 12/02/2015  . Palpitations 09/16/2015  . Low back strain 11/13/2013  . Perennial allergic rhinitis 11/13/2013  . Annual physical exam 08/07/2013  . Multiple benign nevi   . Need for prophylactic vaccination and inoculation against influenza 04/28/2013  . Hyperlipidemia     . Tinnitus of both ears 03/06/2013  . Dyslipidemia 10/29/2012  . Hypothyroidism 10/29/2012  . GERD (gastroesophageal reflux disease) 10/29/2012  . Prostatitis, chronic 10/29/2012  . Chest pain 11/28/2011  . Dizziness 11/28/2011  . Essential hypertension 06/11/2008  . PULMONARY NODULE 06/11/2008    Past Surgical History:  Procedure Laterality Date  . BACK SURGERY     T12 - L1  . CARDIAC CATHETERIZATION N/A 12/02/2015   Procedure: Left Heart Cath and Coronary Angiography;  Surgeon: Peter M Martinique, MD;  Location: [facility];  Service: Cardiovascular;  Laterality: N/A;  . CARDIAC CATHETERIZATION N/A 12/02/2015   Procedure: Coronary Stent Intervention;  Surgeon: Peter M Martinique, MD;  Location: [facility];  Service: Cardiovascular;  Laterality: N/A;  mid LAD Promus 2.5x12  . CORONARY ANGIOPLASTY    . MASS EXCISION  06/25/2012   Procedure: EXCISION MASS;  Surgeon: Wynonia Sours, MD;  Location: [facility];  Service: Orthopedics;  Laterality: Left;  transection of NEUROMA, BURYING RADIAL NERVE IN BRACHIORADIALIS LEFT SIDE  . radial nerve    . SPINAL FUSION     C6-7  . TONSILLECTOMY  1982  . VASECTOMY         Home Medications    Prior to Admission medications   Medication Sig Start Date End Date Taking? Authorizing Provider  acetaminophen (TYLENOL) 500 MG tablet Take 1,000 mg by  mouth every 6 (six) hours as needed for mild pain or headache.     [provider]  amLODipine (NORVASC) 5 MG tablet TAKE 1 TABLET (5 MG TOTAL) BY MOUTH DAILY. 04/17/17   [name], MD  aspirin EC 81 MG tablet Take 81 mg by mouth at bedtime.    [provider]  atorvastatin (LIPITOR) 80 MG tablet Take 1 tablet (80 mg total) by mouth at bedtime. 05/28/17   [name], MD  diazepam (VALIUM) 2 MG tablet Take 1 tablet (2 mg total) by mouth every 12 (twelve) hours as needed for anxiety. 05/28/17   [name], MD  diphenhydrAMINE (BENADRYL) 25 MG tablet  Take 50 mg by mouth 2 (two) times daily as needed for itching.     [provider]  DULoxetine (CYMBALTA) 30 MG capsule Take 1 capsule (30 mg total) by mouth daily. 05/28/17   [name], MD  fluticasone (FLONASE) 50 MCG/ACT nasal spray PLACE 2 SPRAYS INTO BOTH NOSTRILS DAILY. 03/04/17   [name], MD  hydrochlorothiazide (HYDRODIURIL) 25 MG tablet TAKE 1 TABLET (25 MG TOTAL) BY MOUTH DAILY. 04/17/17   [name], MD  HYDROcodone-acetaminophen (NORCO) 10-325 MG tablet Take 1-2 tablets by mouth every 4 (four) hours as needed for moderate pain ((score 4 to 6)). 06/06/17   Kristeen Miss, MD  Hydrocodone-Acetaminophen 10-300 MG TABS Take 1 tablet by mouth every 6 (six) hours as needed. 05/28/17   [name], MD  levocetirizine (XYZAL) 5 MG tablet Take 1 tablet (5 mg total) by mouth every evening. 05/28/17   [name], MD  levothyroxine (SYNTHROID, LEVOTHROID) 100 MCG tablet Take 1 tablet (100 mcg total) by mouth daily. 05/28/17   [name], MD  Melatonin 10 MG TABS Take 10 mg by mouth at bedtime.     [provider]  methocarbamol (ROBAXIN) 500 MG tablet Take 1 tablet (500 mg total) by mouth every 6 (six) hours as needed for muscle spasms. 06/06/17   Kristeen Miss, MD  metoprolol tartrate (LOPRESSOR) 25 MG tablet TAKE 1/2 TABLET (12.5 MG TOTAL) BY MOUTH 2 (TWO) TIMES DAILY. 05/28/17   [name], MD  nitroGLYCERIN (NITROSTAT) 0.4 MG SL tablet Place 1 tablet (0.4 mg total) under the tongue every 5 (five) minutes as needed for chest pain. 12/26/16   [name], MD  pantoprazole (PROTONIX) 40 MG tablet TAKE 1 TABLET BY MOUTH EVERY DAY 04/29/17   [name], MD    Family History Family History  Problem Relation Age of Onset  . Heart failure Mother 48       Died age 17  . Congestive Heart Failure Mother   . Heart attack Father 18       Died with MI  . Heart attack Paternal Grandmother   . Heart attack Paternal Grandfather   . Colon  cancer Neg Hx     Social History Social History   Tobacco Use  . Smoking status: Former Smoker    Packs/day: 1.00    Years: 20.00    Pack years: 20.00    Types: Cigarettes    Last attempt to quit: 11/28/2006    Years since quitting: 10.6  . Smokeless tobacco: Never Used  Substance Use Topics  . Alcohol use: Yes    Alcohol/week: 0.0 oz    Comment: rarely  . Drug use: No     Allergies   Alpha-gal; Beef extract; Beef-derived products; Lambs quarters; Pork-derived  products; Prednisone; Brilinta [ticagrelor]; Doxycycline; Doxycycline hyclate; Penicillins; Shellfish allergy; Adhesive [tape]; Fentanyl; and Penicillin g   Review of Systems Review of Systems 10 Systems reviewed and are negative for acute change except as noted in the HPI.   Physical Exam Updated Vital Signs BP 122/74 (BP Location: Right Arm)   Pulse 72   Temp 98.3 F (36.8 C) (Oral)   Resp 18   Ht 5\' 10"  (1.778 m)   Wt 85.7 kg (189 lb)   SpO2 100%   BMI 27.12 kg/m   Physical Exam  Constitutional: He is oriented to person, place, and time. He appears well-developed and well-nourished. No distress.  HENT:  Head: Normocephalic and atraumatic.  Eyes: Conjunctivae are normal.  Neck: Neck supple.  Cardiovascular: Normal rate, regular rhythm, normal heart sounds and intact distal pulses.  No murmur heard. Pulmonary/Chest: Effort normal and breath sounds normal. No respiratory distress.  Abdominal: Soft. He exhibits no distension. There is no tenderness.  Musculoskeletal: He exhibits edema.  1+ pretibial pitting edema bilateral lower extremities.  Calves are soft and nontender.  Skin condition excellent.  Neurological: He is alert and oriented to person, place, and time. No cranial nerve deficit. He exhibits normal muscle tone. Coordination normal.  Skin: Skin is warm and dry.  Psychiatric: He has a normal mood and affect.  Nursing note and vitals reviewed.    ED Treatments / Results  Labs (all labs  ordered are listed, but only abnormal results are displayed) Labs Reviewed  CBC - Abnormal; Notable for the following components:      Result Value   RBC 3.92 (*)    Hemoglobin 11.8 (*)    HCT 35.9 (*)    All other components within normal limits  BASIC METABOLIC PANEL  PROTIME-INR  TROPONIN I  I-STAT TROPONIN, ED    EKG  EKG Interpretation None       Radiology Dg Chest 2 View  Result Date: 07/05/2017 CLINICAL DATA:  56 y/o M; bilateral leg swelling in intermittent chest pain. EXAM: CHEST  2 VIEW COMPARISON:  07/26/2016 chest radiograph. FINDINGS: Stable normal cardiac silhouette. Linear opacities in the lung bases compatible with minor atelectasis. No consolidation or effusion. No pneumothorax. Mild multilevel degenerative changes of thoracic spine. Anterior cervical fusion hardware. IMPRESSION: Minor bibasilar atelectasis. Otherwise unremarkable chest radiograph. Electronically Signed   By: Kristine Garbe M.D.   On: 07/05/2017 15:55    Procedures Procedures (including critical care time)  Medications Ordered in ED Medications - No data to display   Initial Impression / Assessment and Plan / ED Course  I have reviewed the triage vital signs and the nursing notes.  Pertinent labs & imaging results that were available during my care of the patient were reviewed by me and considered in my medical decision making (see chart for details).     Final Clinical Impressions(s) / ED Diagnoses   Final diagnoses:  Leg swelling  Pain in both lower extremities  Patient's main reason for ED evaluation was to rule out DVT.  DVT study is negative.  Patient has mild peripheral edema that improves with elevation.  Cardiac enzymes are negative and EKG is unchanged from previous.  No signs of acute cardiac ischemia.  Patient has known coronary artery disease but follows closely with his cardiologist.  At this time no suspicion for ACS.  Patient discharged in stable condition.   Discussed neuropathic pain and the use of gabapentin.  Patient reports he believes he does have gabapentin at  home and will discuss ongoing pain control and symptoms with his neurosurgeon.  ED Discharge Orders    None       Charlesetta Shanks, MD 07/05/17 1910

## 2017-07-05 NOTE — ED Triage Notes (Signed)
The pt had spinal fusion oct 29  And for the past 3-4 days he has had bi;lateral leg swelling and some intermittent chest pain none now.  The pt was on plav ix until his surgery nonbe now.  Dr Ellene Route did his surgery and told him to come over to r/o a blood clot he has a stent in his coronary artery placed there last year  Dizziness at times  No sob  Rt leg more painful than the lt

## 2017-08-02 ENCOUNTER — Other Ambulatory Visit: Payer: Self-pay | Admitting: Neurological Surgery

## 2017-08-02 ENCOUNTER — Other Ambulatory Visit (HOSPITAL_COMMUNITY): Payer: Self-pay | Admitting: Neurological Surgery

## 2017-08-02 DIAGNOSIS — M4727 Other spondylosis with radiculopathy, lumbosacral region: Secondary | ICD-10-CM

## 2017-08-08 ENCOUNTER — Ambulatory Visit (HOSPITAL_COMMUNITY)
Admission: RE | Admit: 2017-08-08 | Discharge: 2017-08-08 | Disposition: A | Discharge status: Home / Self Care | Payer: BLUE CROSS/BLUE SHIELD | Source: Ambulatory Visit | Attending: Neurological Surgery | Admitting: Neurological Surgery

## 2017-08-08 ENCOUNTER — Ambulatory Visit (HOSPITAL_COMMUNITY): Payer: BLUE CROSS/BLUE SHIELD

## 2017-08-08 DIAGNOSIS — M4727 Other spondylosis with radiculopathy, lumbosacral region: Secondary | ICD-10-CM

## 2017-08-08 DIAGNOSIS — Z981 Arthrodesis status: Secondary | ICD-10-CM

## 2017-08-08 DIAGNOSIS — M5116 Intervertebral disc disorders with radiculopathy, lumbar region: Secondary | ICD-10-CM | POA: Diagnosis not present

## 2017-08-08 DIAGNOSIS — M48061 Spinal stenosis, lumbar region without neurogenic claudication: Secondary | ICD-10-CM | POA: Diagnosis not present

## 2017-08-08 DIAGNOSIS — M5416 Radiculopathy, lumbar region: Secondary | ICD-10-CM | POA: Diagnosis not present

## 2017-08-08 MED ORDER — DIAZEPAM 5 MG PO TABS
ORAL_TABLET | ORAL | Status: AC
  Administered 2017-08-08: 10 mg via ORAL
  Filled 2017-08-08: qty 2

## 2017-08-08 MED ORDER — IOPAMIDOL (ISOVUE-M 200) INJECTION 41%
20.0000 mL | Freq: Once | INTRAMUSCULAR | Status: AC
  Administered 2017-08-08: 14 mL via INTRATHECAL

## 2017-08-08 MED ORDER — DEXAMETHASONE 6 MG PO TABS
6.0000 mg | ORAL_TABLET | Freq: Once | ORAL | Status: AC
  Administered 2017-08-08: 6 mg via ORAL
  Filled 2017-08-08: qty 1

## 2017-08-08 MED ORDER — OXYCODONE-ACETAMINOPHEN 5-325 MG PO TABS
ORAL_TABLET | ORAL | Status: AC
  Filled 2017-08-08: qty 1

## 2017-08-08 MED ORDER — ONDANSETRON HCL 4 MG/2ML IJ SOLN: 4.0000 mg | Freq: Four times a day (QID) | INTRAMUSCULAR | Status: DC | PRN

## 2017-08-08 MED ORDER — LIDOCAINE HCL (PF) 1 % IJ SOLN
5.0000 mL | Freq: Once | INTRAMUSCULAR | Status: AC
  Administered 2017-08-08: 2 mL via INTRADERMAL

## 2017-08-08 MED ORDER — LIDOCAINE HCL (PF) 1 % IJ SOLN
INTRAMUSCULAR | Status: AC
  Administered 2017-08-08: 2 mL via INTRADERMAL
  Filled 2017-08-08: qty 5

## 2017-08-08 MED ORDER — IOPAMIDOL (ISOVUE-M 200) INJECTION 41%
INTRAMUSCULAR | Status: AC
  Administered 2017-08-08: 14 mL via INTRATHECAL
  Filled 2017-08-08: qty 10

## 2017-08-08 MED ORDER — DEXAMETHASONE 4 MG PO TABS
ORAL_TABLET | ORAL | Status: AC
  Filled 2017-08-08: qty 2

## 2017-08-08 MED ORDER — OXYCODONE-ACETAMINOPHEN 5-325 MG PO TABS
2.0000 | ORAL_TABLET | ORAL | Status: DC | PRN
  Administered 2017-08-08: 2 via ORAL
  Filled 2017-08-08: qty 2

## 2017-08-08 MED ORDER — OXYCODONE-ACETAMINOPHEN 5-325 MG PO TABS
ORAL_TABLET | ORAL | Status: AC
  Administered 2017-08-08: 2 via ORAL
  Filled 2017-08-08: qty 1

## 2017-08-08 MED ORDER — DIAZEPAM 5 MG PO TABS
10.0000 mg | ORAL_TABLET | Freq: Once | ORAL | Status: AC
  Administered 2017-08-08: 10 mg via ORAL
  Filled 2017-08-08: qty 2

## 2017-08-08 NOTE — Procedures (Signed)
Jeffrey Hooper is a 57 year old individual who has had surgical decompression and stabilization from L2-L5 06/03/2017. He has had problems with right leg pain that has become increasingly severe in the recent past. His x-rays appear to show good stability of his decompression and fixation, his pain however has been unrelenting and worsening despite efforts at pain medication and conservative effort. I've advised the myelogram to evaluate the surgical site in addition to any other areas in the lumbar spine might be problematic. He is now admitted for this procedure.  Pre op Dx: Right lumbar radiculopathy Post op Dx: Same, status post fusion L2-L5 Procedure: Lumbar myelogram Surgeon: Jeffrey Hooper Puncture level: L3-4 Fluid color: Clear colorless Injection: Isovue 200, 14 mL Findings: Severe stenosis L5-S1 not previously observed on MRI from August 2018. Further evaluation with CT

## 2017-08-08 NOTE — Discharge Instructions (Signed)
Myelogram, Care After °These instructions give you information about caring for yourself after your procedure. Your doctor may also give you more specific instructions. Call your doctor if you have any problems or questions after your procedure. °Follow these instructions at home: °· Drink enough fluid to keep your pee (urine) clear or pale yellow. °· Rest as told by your doctor. °· Lie flat with your head slightly raised (elevated). °· Do not bend, lift, or do any hard activities for 24-48 hours or as told by your doctor. °· Take over-the-counter and prescription medicines only as told by your doctor. °· Take care of and remove your bandage (dressing) as told by your doctor. °· Bathe or shower as told by your doctor. °Contact a health care provider if: °· You have a fever. °· You have a headache that lasts longer than 24 hours. °· You feel sick to your stomach (nauseous). °· You throw up (vomit). °· Your neck is stiff. °· Your legs feel numb. °· You cannot pee. °· You cannot poop (have a bowel movement). °· You have a rash. °· You are itchy or sneezing. °Get help right away if: °· You have new symptoms or your symptoms get worse. °· You have a seizure. °· You have trouble breathing. °This information is not intended to replace advice given to you by your health care provider. Make sure you discuss any questions you have with your health care provider. °Document Released: 05/01/2008 Document Revised: 03/22/2016 Document Reviewed: 05/05/2015 °Elsevier Interactive Patient Education © 2018 [employer] ° °

## 2017-08-20 ENCOUNTER — Telehealth: Payer: Self-pay | Admitting: *Deleted

## 2017-08-20 ENCOUNTER — Other Ambulatory Visit: Payer: Self-pay | Admitting: Neurological Surgery

## 2017-08-20 NOTE — Telephone Encounter (Signed)
   [facility] Pre-operative Risk Assessment    Request for surgical clearance:  1. What type of surgery is being performed? RIGHT L5 REVISION OF PEDICLE SCREW   2. When is this surgery scheduled? 08/26/17   3. Are there any medications that need to be held prior to surgery and how long?PATIENT STOPPED PLAVIX PRIOR TO BACK SURGERY WITH DR Ellene Route ON 06-03-17 AND HAS NOT RESTARTED IT; HE CONTINUED TO REMAIN ON ASPIRIN 81 MG.   4. Practice name and name of physician performing surgery? DR Kristeen Miss   5. What is your office phone and fax number? PH= [phone]  FAX=[phone]   6. Anesthesia type (None, local, MAC, general) ? UNKNOWN   Fredia Beets 08/20/2017, 5:20 PM  _________________________________________________________________   (provider comments below)

## 2017-08-22 ENCOUNTER — Telehealth: Payer: Self-pay | Admitting: Cardiology

## 2017-08-22 NOTE — Telephone Encounter (Signed)
   Primary Cardiologist: Dr [name]  Chart reviewed and I called pt at home as part of pre-operative protocol coverage. Given past medical history and time since last visit, my phone call with him today,  based on ACC/AHA guidelines, Jeffrey Hooper would be at acceptable risk for the planned procedure without further cardiovascular testing. He has been off Plavix since Oct and this is per Dr Rosezella [place] instructions. The pt may hold ASA up to 48 hrs if needed peri op but resume ASAP post op.  I will route this recommendation to the requesting party via Epic fax function and remove from pre-op pool.  Please call with questions.  Kerin Ransom, PA-C 08/22/2017, 3:12 PM

## 2017-08-23 ENCOUNTER — Encounter (HOSPITAL_COMMUNITY): Payer: Self-pay | Admitting: *Deleted

## 2017-08-23 ENCOUNTER — Other Ambulatory Visit: Payer: Self-pay

## 2017-08-26 ENCOUNTER — Inpatient Hospital Stay (HOSPITAL_COMMUNITY): Payer: BLUE CROSS/BLUE SHIELD | Admitting: Certified Registered Nurse Anesthetist

## 2017-08-26 ENCOUNTER — Other Ambulatory Visit: Payer: Self-pay

## 2017-08-26 ENCOUNTER — Encounter (HOSPITAL_COMMUNITY): Payer: Self-pay | Admitting: Certified Registered Nurse Anesthetist

## 2017-08-26 ENCOUNTER — Observation Stay (HOSPITAL_COMMUNITY)
Admission: RE | Admit: 2017-08-26 | Discharge: 2017-08-26 | Disposition: A | Discharge status: Home / Self Care | Payer: BLUE CROSS/BLUE SHIELD | Source: Ambulatory Visit | Attending: Neurological Surgery | Admitting: Neurological Surgery

## 2017-08-26 ENCOUNTER — Encounter (HOSPITAL_COMMUNITY)
Admission: RE | Disposition: A | Discharge status: Home / Self Care | Payer: Self-pay | Source: Ambulatory Visit | Attending: Neurological Surgery

## 2017-08-26 ENCOUNTER — Inpatient Hospital Stay (HOSPITAL_COMMUNITY): Payer: BLUE CROSS/BLUE SHIELD

## 2017-08-26 DIAGNOSIS — K219 Gastro-esophageal reflux disease without esophagitis: Secondary | ICD-10-CM | POA: Diagnosis not present

## 2017-08-26 DIAGNOSIS — I2510 Atherosclerotic heart disease of native coronary artery without angina pectoris: Secondary | ICD-10-CM | POA: Diagnosis not present

## 2017-08-26 DIAGNOSIS — Z981 Arthrodesis status: Secondary | ICD-10-CM | POA: Diagnosis not present

## 2017-08-26 DIAGNOSIS — T84296A Other mechanical complication of internal fixation device of vertebrae, initial encounter: Secondary | ICD-10-CM | POA: Diagnosis not present

## 2017-08-26 DIAGNOSIS — M5416 Radiculopathy, lumbar region: Secondary | ICD-10-CM | POA: Diagnosis not present

## 2017-08-26 DIAGNOSIS — M48061 Spinal stenosis, lumbar region without neurogenic claudication: Secondary | ICD-10-CM | POA: Insufficient documentation

## 2017-08-26 DIAGNOSIS — Z87891 Personal history of nicotine dependence: Secondary | ICD-10-CM | POA: Insufficient documentation

## 2017-08-26 DIAGNOSIS — F419 Anxiety disorder, unspecified: Secondary | ICD-10-CM | POA: Insufficient documentation

## 2017-08-26 DIAGNOSIS — I10 Essential (primary) hypertension: Secondary | ICD-10-CM | POA: Diagnosis not present

## 2017-08-26 DIAGNOSIS — Z419 Encounter for procedure for purposes other than remedying health state, unspecified: Secondary | ICD-10-CM

## 2017-08-26 DIAGNOSIS — M1990 Unspecified osteoarthritis, unspecified site: Secondary | ICD-10-CM | POA: Insufficient documentation

## 2017-08-26 DIAGNOSIS — Z7982 Long term (current) use of aspirin: Secondary | ICD-10-CM | POA: Diagnosis not present

## 2017-08-26 DIAGNOSIS — E039 Hypothyroidism, unspecified: Secondary | ICD-10-CM | POA: Insufficient documentation

## 2017-08-26 DIAGNOSIS — E785 Hyperlipidemia, unspecified: Secondary | ICD-10-CM | POA: Insufficient documentation

## 2017-08-26 DIAGNOSIS — Z79899 Other long term (current) drug therapy: Secondary | ICD-10-CM | POA: Insufficient documentation

## 2017-08-26 DIAGNOSIS — Z472 Encounter for removal of internal fixation device: Secondary | ICD-10-CM | POA: Diagnosis not present

## 2017-08-26 DIAGNOSIS — Z880 Allergy status to penicillin: Secondary | ICD-10-CM | POA: Insufficient documentation

## 2017-08-26 DIAGNOSIS — M4326 Fusion of spine, lumbar region: Secondary | ICD-10-CM | POA: Diagnosis not present

## 2017-08-26 HISTORY — PX: HARDWARE REMOVAL: SHX979

## 2017-08-26 HISTORY — DX: Unspecified intracranial injury with loss of consciousness of unspecified duration, initial encounter: S06.9X9A

## 2017-08-26 LAB — CBC
HCT: 36.2 % — ABNORMAL LOW (ref 39.0–52.0)
Hemoglobin: 11.7 g/dL — ABNORMAL LOW (ref 13.0–17.0)
MCH: 28 pg (ref 26.0–34.0)
MCHC: 32.3 g/dL (ref 30.0–36.0)
MCV: 86.6 fL (ref 78.0–100.0)
Platelets: 231 10*3/uL (ref 150–400)
RBC: 4.18 MIL/uL — AB (ref 4.22–5.81)
RDW: 12.8 % (ref 11.5–15.5)
WBC: 4.8 10*3/uL (ref 4.0–10.5)

## 2017-08-26 LAB — BASIC METABOLIC PANEL
ANION GAP: 9 (ref 5–15)
BUN: 11 mg/dL (ref 6–20)
CALCIUM: 9.1 mg/dL (ref 8.9–10.3)
CHLORIDE: 105 mmol/L (ref 101–111)
CO2: 27 mmol/L (ref 22–32)
Creatinine, Ser: 1.13 mg/dL (ref 0.61–1.24)
GFR calc non Af Amer: >60 mL/min (ref >60)
Glucose, Bld: 93 mg/dL (ref 65–99)
Potassium: 4 mmol/L (ref 3.5–5.1)
Sodium: 141 mmol/L (ref 135–145)

## 2017-08-26 LAB — SURGICAL PCR SCREEN
MRSA, PCR: NEGATIVE
Staphylococcus aureus: NEGATIVE

## 2017-08-26 SURGERY — REMOVAL, HARDWARE
Anesthesia: General | Site: Spine Lumbar | Laterality: Right

## 2017-08-26 MED ORDER — LACTATED RINGERS IV SOLN
INTRAVENOUS | Status: DC | PRN
  Administered 2017-08-26: 07:00:00 via INTRAVENOUS

## 2017-08-26 MED ORDER — MIDAZOLAM HCL 2 MG/2ML IJ SOLN
INTRAMUSCULAR | Status: AC
  Filled 2017-08-26: qty 2

## 2017-08-26 MED ORDER — ONDANSETRON HCL 4 MG PO TABS: 4.0000 mg | ORAL_TABLET | Freq: Four times a day (QID) | ORAL | Status: DC | PRN

## 2017-08-26 MED ORDER — ROCURONIUM BROMIDE 10 MG/ML (PF) SYRINGE
PREFILLED_SYRINGE | INTRAVENOUS | Status: AC
  Filled 2017-08-26: qty 10

## 2017-08-26 MED ORDER — PROPOFOL 10 MG/ML IV BOLUS
INTRAVENOUS | Status: AC
  Filled 2017-08-26: qty 20

## 2017-08-26 MED ORDER — CHLORHEXIDINE GLUCONATE CLOTH 2 % EX PADS: 6.0000 | MEDICATED_PAD | Freq: Once | CUTANEOUS | Status: DC

## 2017-08-26 MED ORDER — ATORVASTATIN CALCIUM 20 MG PO TABS: 80.0000 mg | ORAL_TABLET | Freq: Every day | ORAL | Status: DC

## 2017-08-26 MED ORDER — MUPIROCIN CALCIUM 2 % EX CREA
TOPICAL_CREAM | CUTANEOUS | Status: AC
  Filled 2017-08-26: qty 15

## 2017-08-26 MED ORDER — FLEET ENEMA 7-19 GM/118ML RE ENEM: 1.0000 | ENEMA | Freq: Once | RECTAL | Status: DC | PRN

## 2017-08-26 MED ORDER — MUPIROCIN 2 % EX OINT
TOPICAL_OINTMENT | CUTANEOUS | Status: AC
  Filled 2017-08-26: qty 22

## 2017-08-26 MED ORDER — SODIUM CHLORIDE 0.9 % IV SOLN: 250.0000 mL | INTRAVENOUS | Status: DC

## 2017-08-26 MED ORDER — VANCOMYCIN HCL 1000 MG IV SOLR
INTRAVENOUS | Status: AC
  Filled 2017-08-26: qty 1000

## 2017-08-26 MED ORDER — MENTHOL 3 MG MT LOZG: 1.0000 | LOZENGE | OROMUCOSAL | Status: DC | PRN

## 2017-08-26 MED ORDER — OXYCODONE HCL 5 MG/5ML PO SOLN: 5.0000 mg | Freq: Once | ORAL | Status: AC | PRN

## 2017-08-26 MED ORDER — SODIUM CHLORIDE 0.9 % IR SOLN
Status: DC | PRN
  Administered 2017-08-26: 500 mL

## 2017-08-26 MED ORDER — DOCUSATE SODIUM 100 MG PO CAPS: 100.0000 mg | ORAL_CAPSULE | Freq: Two times a day (BID) | ORAL | Status: DC

## 2017-08-26 MED ORDER — ONDANSETRON HCL 4 MG/2ML IJ SOLN: 4.0000 mg | Freq: Four times a day (QID) | INTRAMUSCULAR | Status: DC | PRN

## 2017-08-26 MED ORDER — MUPIROCIN 2 % EX OINT
1.0000 "application " | TOPICAL_OINTMENT | Freq: Once | CUTANEOUS | Status: AC
  Administered 2017-08-26: 1 via TOPICAL

## 2017-08-26 MED ORDER — HYDROCODONE-ACETAMINOPHEN 5-325 MG PO TABS: 2.0000 | ORAL_TABLET | ORAL | Status: DC | PRN

## 2017-08-26 MED ORDER — PROMETHAZINE HCL 25 MG/ML IJ SOLN
INTRAMUSCULAR | Status: AC
  Filled 2017-08-26: qty 1

## 2017-08-26 MED ORDER — BISACODYL 10 MG RE SUPP
10.0000 mg | Freq: Every day | RECTAL | Status: DC | PRN
Start: 2017-08-26 — End: 2017-08-26

## 2017-08-26 MED ORDER — LEVOTHYROXINE SODIUM 100 MCG PO TABS: 100.0000 ug | ORAL_TABLET | Freq: Every day | ORAL | Status: DC

## 2017-08-26 MED ORDER — FLUTICASONE PROPIONATE 50 MCG/ACT NA SUSP
2.0000 | Freq: Every day | NASAL | Status: DC
Start: 2017-08-27 — End: 2017-08-26
  Filled 2017-08-26: qty 16

## 2017-08-26 MED ORDER — OXYCODONE HCL 5 MG PO TABS
ORAL_TABLET | ORAL | Status: AC
Start: 2017-08-26 — End: 2017-08-26
  Administered 2017-08-26: 5 mg via ORAL
  Filled 2017-08-26: qty 1

## 2017-08-26 MED ORDER — ROCURONIUM BROMIDE 100 MG/10ML IV SOLN
INTRAVENOUS | Status: DC | PRN
  Administered 2017-08-26: 60 mg via INTRAVENOUS

## 2017-08-26 MED ORDER — HEMOSTATIC AGENTS (NO CHARGE) OPTIME
TOPICAL | Status: DC | PRN
  Administered 2017-08-26: 1 via TOPICAL

## 2017-08-26 MED ORDER — METHOCARBAMOL 500 MG PO TABS: 500.0000 mg | ORAL_TABLET | Freq: Four times a day (QID) | ORAL | Status: DC | PRN

## 2017-08-26 MED ORDER — SUGAMMADEX SODIUM 200 MG/2ML IV SOLN
INTRAVENOUS | Status: AC
  Filled 2017-08-26: qty 2

## 2017-08-26 MED ORDER — LORATADINE 10 MG PO TABS: 10.0000 mg | ORAL_TABLET | Freq: Every evening | ORAL | Status: DC

## 2017-08-26 MED ORDER — MORPHINE SULFATE (PF) 4 MG/ML IV SOLN: 2.0000 mg | INTRAVENOUS | Status: DC | PRN

## 2017-08-26 MED ORDER — PROPOFOL 10 MG/ML IV BOLUS
INTRAVENOUS | Status: DC | PRN
  Administered 2017-08-26: 50 mg via INTRAVENOUS
  Administered 2017-08-26: 110 mg via INTRAVENOUS

## 2017-08-26 MED ORDER — LIDOCAINE-EPINEPHRINE 1 %-1:100000 IJ SOLN
INTRAMUSCULAR | Status: AC
  Filled 2017-08-26: qty 1

## 2017-08-26 MED ORDER — POLYETHYLENE GLYCOL 3350 17 G PO PACK: 17.0000 g | PACK | Freq: Every day | ORAL | Status: DC | PRN

## 2017-08-26 MED ORDER — LIDOCAINE 2% (20 MG/ML) 5 ML SYRINGE
INTRAMUSCULAR | Status: AC
  Filled 2017-08-26: qty 10

## 2017-08-26 MED ORDER — SODIUM CHLORIDE 0.9% FLUSH: 3.0000 mL | Freq: Two times a day (BID) | INTRAVENOUS | Status: DC

## 2017-08-26 MED ORDER — PROMETHAZINE HCL 25 MG/ML IJ SOLN
6.2500 mg | Freq: Once | INTRAMUSCULAR | Status: AC
  Administered 2017-08-26: 12.5 mg via INTRAVENOUS

## 2017-08-26 MED ORDER — ACETAMINOPHEN 325 MG PO TABS: 650.0000 mg | ORAL_TABLET | ORAL | Status: DC | PRN

## 2017-08-26 MED ORDER — NITROGLYCERIN 0.4 MG SL SUBL: 0.4000 mg | SUBLINGUAL_TABLET | SUBLINGUAL | Status: DC | PRN

## 2017-08-26 MED ORDER — OXYCODONE HCL 5 MG PO TABS
5.0000 mg | ORAL_TABLET | Freq: Once | ORAL | Status: AC | PRN
  Administered 2017-08-26: 5 mg via ORAL

## 2017-08-26 MED ORDER — HYDROCODONE-ACETAMINOPHEN 5-325 MG PO TABS: 1.0000 | ORAL_TABLET | ORAL | 0 refills | Status: DC | PRN

## 2017-08-26 MED ORDER — THROMBIN (RECOMBINANT) 5000 UNITS EX SOLR
CUTANEOUS | Status: DC | PRN
  Administered 2017-08-26: 5000 [IU] via TOPICAL

## 2017-08-26 MED ORDER — GABAPENTIN 300 MG PO CAPS
300.0000 mg | ORAL_CAPSULE | Freq: Two times a day (BID) | ORAL | Status: DC
  Administered 2017-08-26: 300 mg via ORAL
  Filled 2017-08-26: qty 1

## 2017-08-26 MED ORDER — ONDANSETRON HCL 4 MG/2ML IJ SOLN
INTRAMUSCULAR | Status: DC | PRN
  Administered 2017-08-26: 4 mg via INTRAVENOUS

## 2017-08-26 MED ORDER — HYDROMORPHONE HCL 1 MG/ML IJ SOLN
0.2500 mg | INTRAMUSCULAR | Status: DC | PRN
  Administered 2017-08-26 (×4): 0.5 mg via INTRAVENOUS

## 2017-08-26 MED ORDER — HYDROCODONE-ACETAMINOPHEN 10-325 MG PO TABS: 1.0000 | ORAL_TABLET | ORAL | Status: DC | PRN

## 2017-08-26 MED ORDER — FENTANYL CITRATE (PF) 100 MCG/2ML IJ SOLN
INTRAMUSCULAR | Status: DC | PRN
  Administered 2017-08-26: 50 ug via INTRAVENOUS
  Administered 2017-08-26: 150 ug via INTRAVENOUS
  Administered 2017-08-26: 50 ug via INTRAVENOUS

## 2017-08-26 MED ORDER — BUPIVACAINE HCL (PF) 0.5 % IJ SOLN
INTRAMUSCULAR | Status: AC
  Filled 2017-08-26: qty 30

## 2017-08-26 MED ORDER — ACETAMINOPHEN 650 MG RE SUPP
650.0000 mg | RECTAL | Status: DC | PRN
Start: 2017-08-26 — End: 2017-08-26

## 2017-08-26 MED ORDER — SUGAMMADEX SODIUM 200 MG/2ML IV SOLN
INTRAVENOUS | Status: DC | PRN
  Administered 2017-08-26: 100 mg via INTRAVENOUS

## 2017-08-26 MED ORDER — HYDROCODONE-ACETAMINOPHEN 5-325 MG PO TABS
1.0000 | ORAL_TABLET | ORAL | Status: DC | PRN
  Administered 2017-08-26: 1 via ORAL
  Filled 2017-08-26: qty 1

## 2017-08-26 MED ORDER — HYDROCHLOROTHIAZIDE 25 MG PO TABS
25.0000 mg | ORAL_TABLET | Freq: Every day | ORAL | Status: DC
  Administered 2017-08-26: 25 mg via ORAL
  Filled 2017-08-26: qty 1

## 2017-08-26 MED ORDER — LIDOCAINE HCL (CARDIAC) 20 MG/ML IV SOLN
INTRAVENOUS | Status: DC | PRN
  Administered 2017-08-26: 60 mg via INTRAVENOUS

## 2017-08-26 MED ORDER — BUPIVACAINE HCL (PF) 0.5 % IJ SOLN
INTRAMUSCULAR | Status: DC | PRN
  Administered 2017-08-26: 5 mL
  Administered 2017-08-26: 20 mL

## 2017-08-26 MED ORDER — METHOCARBAMOL 500 MG PO TABS: 500.0000 mg | ORAL_TABLET | Freq: Four times a day (QID) | ORAL | 3 refills | Status: DC | PRN

## 2017-08-26 MED ORDER — DIAZEPAM 2 MG PO TABS: 2.0000 mg | ORAL_TABLET | Freq: Two times a day (BID) | ORAL | Status: DC | PRN

## 2017-08-26 MED ORDER — VANCOMYCIN HCL IN DEXTROSE 1-5 GM/200ML-% IV SOLN
1000.0000 mg | INTRAVENOUS | Status: AC
  Administered 2017-08-26: 1000 mg via INTRAVENOUS
  Filled 2017-08-26: qty 200

## 2017-08-26 MED ORDER — LIDOCAINE-EPINEPHRINE 1 %-1:100000 IJ SOLN
INTRAMUSCULAR | Status: DC | PRN
  Administered 2017-08-26: 5 mL

## 2017-08-26 MED ORDER — FENTANYL CITRATE (PF) 250 MCG/5ML IJ SOLN
INTRAMUSCULAR | Status: AC
Start: 2017-08-26 — End: ?
  Filled 2017-08-26: qty 5

## 2017-08-26 MED ORDER — THROMBIN (RECOMBINANT) 5000 UNITS EX SOLR
CUTANEOUS | Status: AC
  Filled 2017-08-26: qty 10000

## 2017-08-26 MED ORDER — DEXAMETHASONE SODIUM PHOSPHATE 10 MG/ML IJ SOLN
INTRAMUSCULAR | Status: DC | PRN
  Administered 2017-08-26: 10 mg via INTRAVENOUS

## 2017-08-26 MED ORDER — METHOCARBAMOL 500 MG PO TABS
ORAL_TABLET | ORAL | Status: AC
  Administered 2017-08-26: 500 mg via ORAL
  Filled 2017-08-26: qty 1

## 2017-08-26 MED ORDER — ACETAMINOPHEN 325 MG PO TABS: 325.0000 mg | ORAL_TABLET | ORAL | Status: DC | PRN

## 2017-08-26 MED ORDER — HYDROMORPHONE HCL 1 MG/ML IJ SOLN
INTRAMUSCULAR | Status: AC
  Administered 2017-08-26: 0.5 mg via INTRAVENOUS
  Filled 2017-08-26: qty 1

## 2017-08-26 MED ORDER — METHOCARBAMOL 1000 MG/10ML IJ SOLN
500.0000 mg | Freq: Four times a day (QID) | INTRAVENOUS | Status: DC | PRN
  Filled 2017-08-26: qty 5

## 2017-08-26 MED ORDER — DIPHENHYDRAMINE HCL 25 MG PO CAPS: 50.0000 mg | ORAL_CAPSULE | Freq: Two times a day (BID) | ORAL | Status: DC | PRN

## 2017-08-26 MED ORDER — PHENOL 1.4 % MT LIQD: 1.0000 | OROMUCOSAL | Status: DC | PRN

## 2017-08-26 MED ORDER — MELATONIN 3 MG PO TABS
9.0000 mg | ORAL_TABLET | Freq: Every evening | ORAL | Status: DC | PRN
  Filled 2017-08-26: qty 3

## 2017-08-26 MED ORDER — SODIUM CHLORIDE 0.9% FLUSH: 3.0000 mL | INTRAVENOUS | Status: DC | PRN

## 2017-08-26 MED ORDER — METHOCARBAMOL 500 MG PO TABS
500.0000 mg | ORAL_TABLET | Freq: Four times a day (QID) | ORAL | Status: DC | PRN
  Administered 2017-08-26: 500 mg via ORAL
  Filled 2017-08-26: qty 1

## 2017-08-26 MED ORDER — AMLODIPINE BESYLATE 5 MG PO TABS: 5.0000 mg | ORAL_TABLET | Freq: Every day | ORAL | Status: DC

## 2017-08-26 MED ORDER — DEXAMETHASONE SODIUM PHOSPHATE 10 MG/ML IJ SOLN
INTRAMUSCULAR | Status: AC
  Filled 2017-08-26: qty 1

## 2017-08-26 MED ORDER — LEVOCETIRIZINE DIHYDROCHLORIDE 5 MG PO TABS: 5.0000 mg | ORAL_TABLET | Freq: Every evening | ORAL | Status: DC

## 2017-08-26 MED ORDER — METOPROLOL TARTRATE 12.5 MG HALF TABLET: 12.5000 mg | ORAL_TABLET | Freq: Two times a day (BID) | ORAL | Status: DC

## 2017-08-26 MED ORDER — SENNA 8.6 MG PO TABS
1.0000 | ORAL_TABLET | Freq: Two times a day (BID) | ORAL | Status: DC
  Administered 2017-08-26: 8.6 mg via ORAL
  Filled 2017-08-26: qty 1

## 2017-08-26 MED ORDER — ACETAMINOPHEN 160 MG/5ML PO SOLN: 325.0000 mg | ORAL | Status: DC | PRN

## 2017-08-26 MED ORDER — ONDANSETRON HCL 4 MG/2ML IJ SOLN
INTRAMUSCULAR | Status: AC
  Filled 2017-08-26: qty 2

## 2017-08-26 MED ORDER — ALUM & MAG HYDROXIDE-SIMETH 200-200-20 MG/5ML PO SUSP: 30.0000 mL | Freq: Four times a day (QID) | ORAL | Status: DC | PRN

## 2017-08-26 MED ORDER — 0.9 % SODIUM CHLORIDE (POUR BTL) OPTIME
TOPICAL | Status: DC | PRN
Start: 2017-08-26 — End: 2017-08-26
  Administered 2017-08-26: 1000 mL

## 2017-08-26 MED ORDER — PANTOPRAZOLE SODIUM 40 MG PO TBEC: 40.0000 mg | DELAYED_RELEASE_TABLET | Freq: Every day | ORAL | Status: DC

## 2017-08-26 MED ORDER — MIDAZOLAM HCL 5 MG/5ML IJ SOLN
INTRAMUSCULAR | Status: DC | PRN
  Administered 2017-08-26: 2 mg via INTRAVENOUS

## 2017-08-26 SURGICAL SUPPLY — 57 items
ADH SKN CLS APL DERMABOND .7 (GAUZE/BANDAGES/DRESSINGS) ×1
APL SKNCLS STERI-STRIP NONHPOA (GAUZE/BANDAGES/DRESSINGS)
BAG DECANTER FOR FLEXI CONT (MISCELLANEOUS) ×2 IMPLANT
BENZOIN TINCTURE PRP APPL 2/3 (GAUZE/BANDAGES/DRESSINGS) IMPLANT
BLADE CLIPPER SURG (BLADE) ×1 IMPLANT
CANISTER SUCT 3000ML PPV (MISCELLANEOUS) ×2 IMPLANT
CARTRIDGE OIL MAESTRO DRILL (MISCELLANEOUS) IMPLANT
DECANTER SPIKE VIAL GLASS SM (MISCELLANEOUS) ×2 IMPLANT
DERMABOND ADVANCED (GAUZE/BANDAGES/DRESSINGS) ×1
DERMABOND ADVANCED .7 DNX12 (GAUZE/BANDAGES/DRESSINGS) IMPLANT
DIFFUSER DRILL AIR PNEUMATIC (MISCELLANEOUS) IMPLANT
DRAPE C-ARM 42X72 X-RAY (DRAPES) ×2 IMPLANT
DRAPE LAPAROTOMY 100X72 PEDS (DRAPES) ×1 IMPLANT
DRAPE LAPAROTOMY 100X72X124 (DRAPES) IMPLANT
DRAPE POUCH INSTRU U-SHP 10X18 (DRAPES) ×2 IMPLANT
DRSG OPSITE POSTOP 4X6 (GAUZE/BANDAGES/DRESSINGS) ×1 IMPLANT
DURAPREP 26ML APPLICATOR (WOUND CARE) ×2 IMPLANT
ELECT BLADE 4.0 EZ CLEAN MEGAD (MISCELLANEOUS) ×2
ELECT REM PT RETURN 9FT ADLT (ELECTROSURGICAL) ×2
ELECTRODE BLDE 4.0 EZ CLN MEGD (MISCELLANEOUS) ×1 IMPLANT
ELECTRODE REM PT RTRN 9FT ADLT (ELECTROSURGICAL) ×1 IMPLANT
GAUZE SPONGE 4X4 12PLY STRL (GAUZE/BANDAGES/DRESSINGS) ×1 IMPLANT
GAUZE SPONGE 4X4 16PLY XRAY LF (GAUZE/BANDAGES/DRESSINGS) ×1 IMPLANT
GLOVE BIOGEL PI IND STRL 8 (GLOVE) IMPLANT
GLOVE BIOGEL PI IND STRL 8.5 (GLOVE) ×1 IMPLANT
GLOVE BIOGEL PI INDICATOR 8 (GLOVE) ×2
GLOVE BIOGEL PI INDICATOR 8.5 (GLOVE) ×1
GLOVE ECLIPSE 7.5 STRL STRAW (GLOVE) ×3 IMPLANT
GLOVE ECLIPSE 8.5 STRL (GLOVE) ×2 IMPLANT
GOWN STRL REUS W/ TWL LRG LVL3 (GOWN DISPOSABLE) ×1 IMPLANT
GOWN STRL REUS W/ TWL XL LVL3 (GOWN DISPOSABLE) ×1 IMPLANT
GOWN STRL REUS W/TWL 2XL LVL3 (GOWN DISPOSABLE) ×3 IMPLANT
GOWN STRL REUS W/TWL LRG LVL3 (GOWN DISPOSABLE)
GOWN STRL REUS W/TWL XL LVL3 (GOWN DISPOSABLE)
KIT BASIN OR (CUSTOM PROCEDURE TRAY) ×2 IMPLANT
KIT ROOM TURNOVER OR (KITS) ×2 IMPLANT
MARKER SKIN DUAL TIP RULER LAB (MISCELLANEOUS) ×2 IMPLANT
NEEDLE HYPO 22GX1.5 SAFETY (NEEDLE) ×2 IMPLANT
NS IRRIG 1000ML POUR BTL (IV SOLUTION) ×2 IMPLANT
OIL CARTRIDGE MAESTRO DRILL (MISCELLANEOUS)
PACK LAMINECTOMY NEURO (CUSTOM PROCEDURE TRAY) ×2 IMPLANT
PAD ARMBOARD 7.5X6 YLW CONV (MISCELLANEOUS) ×6 IMPLANT
RASP 3.0MM (RASP) IMPLANT
ROD 70MM SPINAL (Rod) ×1 IMPLANT
SPONGE LAP 4X18 X RAY DECT (DISPOSABLE) IMPLANT
SPONGE SURGIFOAM ABS GEL SZ50 (HEMOSTASIS) ×1 IMPLANT
STAPLER SKIN PROX WIDE 3.9 (STAPLE) IMPLANT
STRIP CLOSURE SKIN 1/2X4 (GAUZE/BANDAGES/DRESSINGS) IMPLANT
SUT VIC AB 0 CT1 18XCR BRD8 (SUTURE) ×1 IMPLANT
SUT VIC AB 0 CT1 8-18 (SUTURE) ×2
SUT VIC AB 2-0 CP2 18 (SUTURE) ×2 IMPLANT
SUT VIC AB 3-0 SH 8-18 (SUTURE) ×3 IMPLANT
SWAB COLLECTION DEVICE MRSA (MISCELLANEOUS) IMPLANT
SWAB CULTURE ESWAB REG 1ML (MISCELLANEOUS) IMPLANT
TOWEL GREEN STERILE (TOWEL DISPOSABLE) ×2 IMPLANT
TOWEL GREEN STERILE FF (TOWEL DISPOSABLE) ×2 IMPLANT
WATER STERILE IRR 1000ML POUR (IV SOLUTION) ×2 IMPLANT

## 2017-08-26 NOTE — Progress Notes (Signed)
Patient is discharged from 3C08 at this time. Alert and in stable condition. IV site d/c'd and instructions read to patient and spouse with understanding verbalized. Left unit via wheelchair with all belongings at side.

## 2017-08-26 NOTE — Transfer of Care (Signed)
Immediate Anesthesia Transfer of Care Note  Patient: Jeffrey Hooper  Procedure(s) Performed: Right Lumbar Five Revision of pedicle screw with Removal of Lumbar Five Screw (Right Spine Lumbar)  Patient Location: PACU   Anesthesia Type:General  Level of Consciousness: awake, alert  and patient cooperative  Airway & Oxygen Therapy: Patient Spontanous Breathing  Post-op Assessment: Report given to RN and Post -op Vital signs reviewed and stable  Post vital signs: Reviewed and stable  Last Vitals:  Vitals:   08/26/17 0623 08/26/17 0905  BP: 139/76 (!) 146/76  Pulse: (!) 57 88  Resp: 20 12  Temp: 36.6 C (!) 36.3 C  SpO2: 100% 99%    Last Pain:  Vitals:   08/26/17 0623  TempSrc: Oral  PainSc:          Complications: No apparent anesthesia complications

## 2017-08-26 NOTE — Discharge Summary (Signed)
Physician Discharge Summary  Patient ID: Jeffrey Hooper MRN: [id number] DOB/AGE: 57-Feb-1962 57 y.o.  Admit date: 08/26/2017 Discharge date: 08/26/2017  Admission Diagnoses: Right lumbar radiculopathy status post decompression fusion L2-L5  Discharge Diagnoses: Right lumbar radiculopathy status post decompression fusion L2-L5  Active Problems:   Lumbar radiculopathy, chronic   Discharged Condition: good  Hospital Course: Patient was admitted to undergo surgical decompression by removing a poorly placed pedicle screw at L5. He tolerated surgery well. His incision is clean and dry. He is ambulatory.  Consults: None  Significant Diagnostic Studies: Nonesurgery:   Treatments: Removal of pedicle screw from L5 revision of posterior fixation L2-L4  Discharge Exam: Blood pressure 140/84, pulse 70, temperature 98 F (36.7 C), resp. rate 16, height 5' 10.5" (1.791 m), weight 85.7 kg (189 lb), SpO2 98 %. Incision is clean and dry station and gait are intact.  Disposition: 01-Home or Self Care  Discharge Instructions    Call MD for:  redness, tenderness, or signs of infection (pain, swelling, redness, odor or green/yellow discharge around incision site)   Complete by:  As directed    Call MD for:  severe uncontrolled pain   Complete by:  As directed    Call MD for:  temperature >100.4   Complete by:  As directed    Diet - low sodium heart healthy   Complete by:  As directed    Discharge instructions   Complete by:  As directed    Okay to shower. Do not apply salves or appointments to incision. No heavy lifting with the upper extremities greater than 15 pounds. May resume driving when not requiring pain medication and patient feels comfortable with doing so.   Incentive spirometry RT   Complete by:  As directed    Increase activity slowly   Complete by:  As directed      Allergies as of 08/26/2017      Reactions   Alpha-gal Rash, Other (See Comments)   SEVERE ALLERGY TO ANY MEAT  OR MEAT DERIVED PRODUCTS FROM 4 LEGGED ANIMALS > > BEEF, PORK , GOATS, DEER, ETC. < < RESULT OF BITE FROM LONE STAR TICK   Beef Extract Rash, Other (See Comments)   Beef-derived Products Rash, Other (See Comments)   Lambs Quarters Rash, Other (See Comments)   Pork-derived Products Rash, Other (See Comments)   Prednisone Other (See Comments)   im depo medrol caused dizziness - pt passed out. Ask pt before giving (pt has taken since the reaction)   Brilinta [ticagrelor] Hives   Doxycycline Hives   Doxycycline Hyclate Hives, Itching   Penicillins Hives   Has patient had a PCN reaction causing immediate rash, facial/tongue/throat swelling, SOB or lightheadedness with hypotension: NO Pt states has has taken PCN after Vibramycin and not had any problems. Has patient had a PCN reaction causing severe rash involving mucus membranes or skin necrosis: No Has patient had a PCN reaction that required hospitalization No Has patient had a PCN reaction occurring within the last 10 years: No If all of the above answers are "NO", then may proceed with Cephalospo   Shellfish Allergy Hives   Adhesive [tape] Rash   Fentanyl Rash   Reaction to adhesive, not the drug    Penicillin G Rash      Medication List    TAKE these medications   acetaminophen 500 MG tablet Commonly known as:  TYLENOL Take 1,000 mg by mouth every 6 (six) hours as needed for mild pain or headache.  amLODipine 5 MG tablet Commonly known as:  NORVASC TAKE 1 TABLET (5 MG TOTAL) BY MOUTH DAILY.   aspirin EC 81 MG tablet Take 81 mg by mouth at bedtime.   atorvastatin 80 MG tablet Commonly known as:  LIPITOR Take 1 tablet (80 mg total) by mouth at bedtime.   diazepam 2 MG tablet Commonly known as:  VALIUM Take 1 tablet (2 mg total) by mouth every 12 (twelve) hours as needed for anxiety.   diphenhydrAMINE 25 MG tablet Commonly known as:  BENADRYL Take 50 mg by mouth 2 (two) times daily as needed for itching.   DULoxetine  30 MG capsule Commonly known as:  CYMBALTA Take 1 capsule (30 mg total) by mouth daily.   fluticasone 50 MCG/ACT nasal spray Commonly known as:  FLONASE PLACE 2 SPRAYS INTO BOTH NOSTRILS DAILY.   gabapentin 300 MG capsule Commonly known as:  NEURONTIN Take 300 mg by mouth 2 (two) times daily.   hydrochlorothiazide 25 MG tablet Commonly known as:  HYDRODIURIL TAKE 1 TABLET (25 MG TOTAL) BY MOUTH DAILY.   HYDROcodone-acetaminophen 10-325 MG tablet Commonly known as:  NORCO Take 1-2 tablets by mouth every 4 (four) hours as needed for moderate pain ((score 4 to 6)). What changed:  Another medication with the same name was added. Make sure you understand how and when to take each.   HYDROcodone-acetaminophen 5-325 MG tablet Commonly known as:  NORCO/VICODIN Take 1-2 tablets by mouth every 4 (four) hours as needed for severe pain ((score 7 to 10)). What changed:  You were already taking a medication with the same name, and this prescription was added. Make sure you understand how and when to take each.   levocetirizine 5 MG tablet Commonly known as:  XYZAL Take 1 tablet (5 mg total) by mouth every evening.   levothyroxine 100 MCG tablet Commonly known as:  SYNTHROID, LEVOTHROID Take 1 tablet (100 mcg total) by mouth daily.   Melatonin 10 MG Tabs Take 10 mg by mouth at bedtime as needed (sleep).   methocarbamol 500 MG tablet Commonly known as:  ROBAXIN Take 1 tablet (500 mg total) by mouth every 6 (six) hours as needed for muscle spasms. What changed:  Another medication with the same name was added. Make sure you understand how and when to take each.   methocarbamol 500 MG tablet Commonly known as:  ROBAXIN Take 1 tablet (500 mg total) by mouth every 6 (six) hours as needed for muscle spasms. What changed:  You were already taking a medication with the same name, and this prescription was added. Make sure you understand how and when to take each.   metoprolol tartrate 25 MG  tablet Commonly known as:  LOPRESSOR TAKE 1/2 TABLET (12.5 MG TOTAL) BY MOUTH 2 (TWO) TIMES DAILY.   nitroGLYCERIN 0.4 MG SL tablet Commonly known as:  NITROSTAT Place 1 tablet (0.4 mg total) under the tongue every 5 (five) minutes as needed for chest pain.   pantoprazole 40 MG tablet Commonly known as:  PROTONIX TAKE 1 TABLET BY MOUTH EVERY DAY What changed:    how much to take  how to take this  when to take this        Signed: Earleen Newport 08/26/2017, 10:51 AM

## 2017-08-26 NOTE — Op Note (Signed)
Date of surgery: 08/26/2017 Preoperative diagnosis: Right L5 radiculopathy, status post decompression and fusion L2-L5 with pedicle screw fixation. Postoperative diagnosis: Same Procedure: Revision of hardware L2-L5 on the right with removal of L5 pedicle screw fluoroscopic guidance Surgeon: Kristeen Miss Anesthesia: Gen. endotracheal Indications: Jeffrey Hooper is a 57 year old individual who in October of this year underwent surgical decompression from L2-L5 secondary to advanced spondylosis and stenosis. Postoperatively complained of right lumbar radicular pain. Because this has persisted a CT scan in a myelogram was performed which ultimately demonstrated that he had some cutout of the L5 screw medially and inferiorly. This was likely affecting the path of the L5 nerve root. Is advised regarding removal of the hardware and possible reinsertion versus a simple removal alone.  Procedure: Patient is brought to the operating room supine on a stretcher. After the smooth induction of general endotracheal anesthesia, he was turned prone. Back was prepped without wall DuraPrep and draped in a sterile fashion. Fluoroscopic guidance was used to localize a hardware in the right side. The skin above this area was infiltrated with 10 mL of 1% lidocaine with epinephrine mixed 50-50 with half percent Marcaine. A vertical incision was made in this region carried down through subcutaneous tissues. Lumbar dorsal fascia was opened and a blunt dissection was then performed with the first Metrix dilator. The center of the inferior most screw was identified. Series of dilators was passed over this and ultimately a 2 retractor measuring 18 mm in diameter and 6 cm in depth was passed over the pedicle screw. The Of the screws removed. Then after removing this retractor the dissection was performed superiorly over the hardware through the paraspinous muscle. A self-retaining retractor was placed in this wound and the other screws  were identified all the way of L2. The screw heads were clean. The caps were removed. The rod was removed. Then by placing a screwdriver into the L5 screw the construct was mobilized and no mobility could be obtained is felt that this fusion was healing solidly. Because of that the L5 screw was removed. Area was inspected and hemostasis was obtained. A shorter rod was then used to connect the L2-L3 and L4 screws on the right side. Screw caps were replaced and the system was torqued into a neutral construct. Hemostasis in the soft tissues obtained. Then the lumbar dorsal fascia was closed with #1 Vicryl loosely in interrupted fashion and 2-0 Vicryl was used subcutaneously and 3-0 Vicryl subcutaneous take oral he. Blood loss is estimated at approximately 75 mL. Patient was returned to recovery room in stable condition.

## 2017-08-26 NOTE — Anesthesia Preprocedure Evaluation (Signed)
Anesthesia Evaluation  Patient identified by MRN, date of birth, ID band Patient awake    Reviewed: Allergy & Precautions, H&P , NPO status , Patient's Chart, lab work & pertinent test results, reviewed documented beta blocker date and time   History of Anesthesia Complications Negative for: history of anesthetic complications  Airway Mallampati: II  TM Distance: <3 FB Neck ROM: Limited    Dental  (+) Teeth Intact, Dental Advisory Given   Pulmonary neg shortness of breath, neg sleep apnea, neg recent URI, former smoker,    breath sounds clear to auscultation       Cardiovascular hypertension, Pt. on medications and Pt. on home beta blockers (-) angina+ CAD and + Cardiac Stents   Rhythm:regular Rate:Normal  Preserved LV function. Cleared by cardiology.   Neuro/Psych  Headaches, Anxiety  Neuromuscular disease    GI/Hepatic Neg liver ROS, GERD  ,  Endo/Other  Hypothyroidism   Renal/GU negative Renal ROS     Musculoskeletal  (+) Arthritis ,   Abdominal   Peds  Hematology negative hematology ROS (+)   Anesthesia Other Findings   Reproductive/Obstetrics                             Anesthesia Physical Anesthesia Plan  ASA: II  Anesthesia Plan: General   Post-op Pain Management:    Induction: Intravenous  PONV Risk Score and Plan: 2 and Ondansetron and Dexamethasone  Airway Management Planned: Oral ETT  Additional Equipment: None  Intra-op Plan:   Post-operative Plan: Extubation in OR  Informed Consent: I have reviewed the patients History and Physical, chart, labs and discussed the procedure including the risks, benefits and alternatives for the proposed anesthesia with the patient or authorized representative who has indicated his/her understanding and acceptance.   Dental advisory given  Plan Discussed with: CRNA and Surgeon  Anesthesia Plan Comments:          Anesthesia Quick Evaluation

## 2017-08-26 NOTE — Discharge Instructions (Signed)
Wound Care Leave incision open to air. You may shower. Do not scrub directly on incision.  Do not put any creams, lotions, or ointments on incision. Activity Walk each and every day, increasing distance each day. No lifting greater than 5 lbs.  Avoid excessive neck motion. No driving for 2 weeks; may ride as a passenger locally.  Diet Resume your normal diet.   Call Your Doctor If Any of These Occur Redness, drainage, or swelling at the wound.  Temperature greater than 101 degrees. Severe pain not relieved by pain medication. Increased difficulty swallowing. Incision starts to come apart. Follow Up Appt Call today for appointment in 2-3 weeks ([phone]) or for problems.  If you have any hardware placed in your spine, you will need an x-ray before your appointment.  

## 2017-08-26 NOTE — H&P (Signed)
Jeffrey Hooper is an 57 y.o. male.   Chief Complaint: Persistent back and right leg pain HPI: Jeffrey Hooper is a 57 year old individual who underwent surgical decompression stabilization from L2-L5on 06/03/2017. He woke up and has had persistent right lumbar radiculopathy since that time. A recent myelogram demonstrates presence of a displaced screw on the right side at L5. He's been advised that this may likely be the cause of his persistent radiculopathy. He is now being admitted to undergo surgical revision of his fixation on the right side.  Past Medical History:  Diagnosis Date  . Allergy   . Anginal pain ([place])   . Anxiety   . Arthritis   . CAD (coronary artery disease), native coronary artery 12/03/2015  . Coronary artery disease   . Dyspnea    with  exertion  . Dysrhythmia    "irregular and PAC'S"  . GERD (gastroesophageal reflux disease)   . Head injury with loss of consciousness ([place])   . Headache   . History of kidney stones   . Hyperlipidemia   . Hypertension   . Hypothyroid   . Multiple benign nevi   . Neuromuscular disorder ([place]) 2011   LEFT RADIAL NERVE SURGERY R/T TRAUMA  . Pneumonia 2014, 2017  . Prostatism   . Pulmonary nodule   . Seasonal allergies     Past Surgical History:  Procedure Laterality Date  . BACK SURGERY     T12 - L1  . CARDIAC CATHETERIZATION N/A 12/02/2015   Procedure: Left Heart Cath and Coronary Angiography;  Surgeon: Peter M Martinique, MD;  Location: [facility];  Service: Cardiovascular;  Laterality: N/A;  . CARDIAC CATHETERIZATION N/A 12/02/2015   Procedure: Coronary Stent Intervention;  Surgeon: Peter M Martinique, MD;  Location: [facility];  Service: Cardiovascular;  Laterality: N/A;  mid LAD Promus 2.5x12  . CORONARY ANGIOPLASTY    . MASS EXCISION  06/25/2012   Procedure: EXCISION MASS;  Surgeon: Wynonia Sours, MD;  Location: [facility];  Service: Orthopedics;  Laterality: Left;  transection of NEUROMA, BURYING  RADIAL NERVE IN BRACHIORADIALIS LEFT SIDE  . radial nerve Left    cut in work injury  . SPINAL FUSION     C6-7  . TONSILLECTOMY  1982  . VASECTOMY      Family History  Problem Relation Age of Onset  . Heart failure Mother 41       Died age 7  . Congestive Heart Failure Mother   . Heart attack Father 46       Died with MI  . Heart attack Paternal Grandmother   . Heart attack Paternal Grandfather   . Colon cancer Neg Hx    Social History:  reports that he quit smoking about 10 years ago. His smoking use included cigarettes. He has a 20.00 pack-year smoking history. he has never used smokeless tobacco. He reports that he drinks alcohol. He reports that he does not use drugs.  Allergies:  Allergies  Allergen Reactions  . Alpha-Gal Rash and Other (See Comments)    SEVERE ALLERGY TO ANY MEAT OR MEAT DERIVED PRODUCTS FROM 4 LEGGED ANIMALS > > BEEF, PORK , GOATS, DEER, ETC. < < RESULT OF BITE FROM LONE STAR TICK  . Beef Extract Rash and Other (See Comments)  . Beef-Derived Products Rash and Other (See Comments)  . Lambs Quarters Rash and Other (See Comments)  . Pork-Derived Products Rash and Other (See Comments)  . Prednisone Other (See  Comments)    im depo medrol caused dizziness - pt passed out. Ask pt before giving (pt has taken since the reaction)  . Brilinta [Ticagrelor] Hives  . Doxycycline Hives  . Doxycycline Hyclate Hives and Itching  . Penicillins Hives    Has patient had a PCN reaction causing immediate rash, facial/tongue/throat swelling, SOB or lightheadedness with hypotension: NO Pt states has has taken PCN after Vibramycin and not had any problems. Has patient had a PCN reaction causing severe rash involving mucus membranes or skin necrosis: No Has patient had a PCN reaction that required hospitalization No Has patient had a PCN reaction occurring within the last 10 years: No If all of the above answers are "NO", then may proceed with Cephalospo  . Shellfish  Allergy Hives  . Adhesive [Tape] Rash  . Fentanyl Rash    Reaction to adhesive, not the drug   . Penicillin G Rash    Medications Prior to Admission  Medication Sig Dispense Refill  . acetaminophen (TYLENOL) 500 MG tablet Take 1,000 mg by mouth every 6 (six) hours as needed for mild pain or headache.     [name] amLODipine (NORVASC) 5 MG tablet TAKE 1 TABLET (5 MG TOTAL) BY MOUTH DAILY. 90 tablet 1  . aspirin EC 81 MG tablet Take 81 mg by mouth at bedtime.    [name] atorvastatin (LIPITOR) 80 MG tablet Take 1 tablet (80 mg total) by mouth at bedtime. 90 tablet 1  . diazepam (VALIUM) 2 MG tablet Take 1 tablet (2 mg total) by mouth every 12 (twelve) hours as needed for anxiety. 60 tablet 1  . DULoxetine (CYMBALTA) 30 MG capsule Take 1 capsule (30 mg total) by mouth daily. 30 capsule 1  . fluticasone (FLONASE) 50 MCG/ACT nasal spray PLACE 2 SPRAYS INTO BOTH NOSTRILS DAILY. 16 g 5  . gabapentin (NEURONTIN) 300 MG capsule Take 300 mg by mouth 2 (two) times daily.  2  . hydrochlorothiazide (HYDRODIURIL) 25 MG tablet TAKE 1 TABLET (25 MG TOTAL) BY MOUTH DAILY. 90 tablet 1  . HYDROcodone-acetaminophen (NORCO) 10-325 MG tablet Take 1-2 tablets by mouth every 4 (four) hours as needed for moderate pain ((score 4 to 6)). 60 tablet 0  . levocetirizine (XYZAL) 5 MG tablet Take 1 tablet (5 mg total) by mouth every evening. 90 tablet 3  . levothyroxine (SYNTHROID, LEVOTHROID) 100 MCG tablet Take 1 tablet (100 mcg total) by mouth daily. 90 tablet 2  . metoprolol tartrate (LOPRESSOR) 25 MG tablet TAKE 1/2 TABLET (12.5 MG TOTAL) BY MOUTH 2 (TWO) TIMES DAILY. 90 tablet 1  . pantoprazole (PROTONIX) 40 MG tablet TAKE 1 TABLET BY MOUTH EVERY DAY (Patient taking differently: TAKE 1 TABLET (40mg ) BY MOUTH EVERY DAY) 30 tablet 5  . diphenhydrAMINE (BENADRYL) 25 MG tablet Take 50 mg by mouth 2 (two) times daily as needed for itching.     . Melatonin 10 MG TABS Take 10 mg by mouth at bedtime as needed (sleep).     . methocarbamol  (ROBAXIN) 500 MG tablet Take 1 tablet (500 mg total) by mouth every 6 (six) hours as needed for muscle spasms. 40 tablet 3  . nitroGLYCERIN (NITROSTAT) 0.4 MG SL tablet Place 1 tablet (0.4 mg total) under the tongue every 5 (five) minutes as needed for chest pain. 20 tablet 2    No results found for this or any previous visit (from the past 48 hour(s)). No results found.  Review of Systems  Constitutional: Negative.   HENT: Negative.  Eyes: Negative.   Respiratory: Negative.   Cardiovascular: Negative.   Gastrointestinal: Negative.   Genitourinary: Negative.   Musculoskeletal: Positive for back pain.  Skin: Negative.   Neurological: Positive for tingling, sensory change and focal weakness.  Endo/Heme/Allergies: Negative.   Psychiatric/Behavioral: Negative.     Blood pressure 139/76, pulse (!) 57, temperature 97.8 F (36.6 C), temperature source Oral, resp. rate 20, height 5' 10.5" (1.791 m), weight 85.7 kg (189 lb), SpO2 100 %. Physical Exam  Constitutional: He is oriented to person, place, and time. He appears well-developed and well-nourished.  HENT:  Head: Normocephalic and atraumatic.  Eyes: Conjunctivae and EOM are normal. Pupils are equal, round, and reactive to light.  Neck: Normal range of motion. Neck supple.  Cardiovascular: Normal rate and regular rhythm.  Respiratory: Effort normal and breath sounds normal.  GI: Soft. Bowel sounds are normal.  Musculoskeletal: Normal range of motion.  Neurological: He is alert and oriented to person, place, and time.  Positive straight leg raising on the right at 15 Patrick's maneuver is negative. Weakness in tibialis anterior on the right compared to the left.  Skin: Skin is warm and dry.  Psychiatric: He has a normal mood and affect. His behavior is normal. Judgment and thought content normal.   lar pain. Her recent myelogram post milligrams CAT scan demonstrates presence of a displaced screw on the right  side.  Assessment/Plan Right lumbar radiculopathy secondary to displaced implanted hardware.  Plan: Revision of hardware on the right side with replacement of right L5 screw.  Earleen Newport, MD 08/26/2017, 7:28 AM

## 2017-08-26 NOTE — Anesthesia Procedure Notes (Signed)
Procedure Name: Intubation Date/Time: 08/26/2017 7:40 AM Performed by: White, Amedeo Plenty, CRNA Pre-anesthesia Checklist: Patient identified, Emergency Drugs available, Suction available and Patient being monitored Patient Re-evaluated:Patient Re-evaluated prior to induction Oxygen Delivery Method: Circle System Utilized Preoxygenation: Pre-oxygenation with 100% oxygen Induction Type: IV induction Ventilation: Mask ventilation without difficulty Laryngoscope Size: 4 and Mac Grade View: Grade III Tube type: Oral Tube size: 7.5 mm Number of attempts: 1 Airway Equipment and Method: Oral airway Placement Confirmation: ETT inserted through vocal cords under direct vision,  positive ETCO2 and breath sounds checked- equal and bilateral Secured at: 22 cm Tube secured with: Tape Dental Injury: Teeth and Oropharynx as per pre-operative assessment

## 2017-08-26 NOTE — Progress Notes (Signed)
Patient ID: Jeffrey Hooper, male   DOB: 29-May-1961, 57 y.o.   MRN: [id number] Patient is awake alert Still has some right lumbar radicular pain He is good in voided May be ready for discharge this afternoon

## 2017-08-27 ENCOUNTER — Encounter (HOSPITAL_COMMUNITY): Payer: Self-pay | Admitting: Neurological Surgery

## 2017-08-27 NOTE — Anesthesia Postprocedure Evaluation (Signed)
Anesthesia Post Note  Patient: Jeffrey Hooper  Procedure(s) Performed: Right Lumbar Five Revision of pedicle screw with Removal of Lumbar Five Screw (Right Spine Lumbar)     Patient location during evaluation: PACU Anesthesia Type: General Level of consciousness: awake and alert Pain management: pain level controlled Vital Signs Assessment: post-procedure vital signs reviewed and stable Respiratory status: spontaneous breathing, nonlabored ventilation, respiratory function stable and patient connected to nasal cannula oxygen Cardiovascular status: blood pressure returned to baseline and stable Postop Assessment: no apparent nausea or vomiting Anesthetic complications: no    Last Vitals:  Vitals:   08/26/17 1010 08/26/17 1036  BP: 133/84 140/84  Pulse: 72 70  Resp: 12 16  Temp: 36.4 C 36.7 C  SpO2: 95% 98%    Last Pain:  Vitals:   08/26/17 1235  TempSrc:   PainSc: 4                  Lidiya Reise

## 2017-08-29 ENCOUNTER — Ambulatory Visit: Payer: BLUE CROSS/BLUE SHIELD | Admitting: Pediatrics

## 2017-08-29 ENCOUNTER — Encounter: Payer: Self-pay | Admitting: Pediatrics

## 2017-08-29 VITALS — BP 127/83 | HR 66 | Temp 97.7°F | Ht 70.5 in | Wt 212.0 lb

## 2017-08-29 DIAGNOSIS — I10 Essential (primary) hypertension: Secondary | ICD-10-CM

## 2017-08-29 DIAGNOSIS — H9313 Tinnitus, bilateral: Secondary | ICD-10-CM

## 2017-08-29 DIAGNOSIS — E785 Hyperlipidemia, unspecified: Secondary | ICD-10-CM

## 2017-08-29 DIAGNOSIS — M79604 Pain in right leg: Secondary | ICD-10-CM

## 2017-08-29 MED ORDER — HYDROCODONE-ACETAMINOPHEN 5-325 MG PO TABS: 1.0000 | ORAL_TABLET | Freq: Four times a day (QID) | ORAL | 0 refills | Status: DC | PRN

## 2017-08-29 NOTE — Progress Notes (Signed)
  Subjective:   Patient ID: Jeffrey Hooper, male    DOB: 13-Nov-1960, 57 y.o.   MRN: [id number] CC: Follow-up (3 month) med problems HPI: Jeffrey Hooper is a 57 y.o. male presenting for Follow-up (3 month)  Had first surgery Oct 29, second surgery 3 days ago to remove "a poorly placed screw"   Has been walking around house, carport regularly Can't walk for long time R leg still with burning, shooting pains that started after first surgery Prior to either surgery it was the L leg that was primarily bothering him, has been improving Using a cane now, at first with a walker Not lifting more than 5 lbs at a time for several months  Was sent home with hydrocodone 5mg , taking 2-3 a day  HTN: taking meds regularly, no CP, SOB  HLD: taking med regularly, no s/e  Relevant past medical, surgical, family and social history reviewed. Allergies and medications reviewed and updated. Social History   Tobacco Use  Smoking Status Former Smoker  . Packs/day: 1.00  . Years: 20.00  . Pack years: 20.00  . Types: Cigarettes  . Last attempt to quit: 11/28/2006  . Years since quitting: 10.7  Smokeless Tobacco Never Used   ROS: Per HPI   Objective:    BP 127/83   Pulse 66   Temp 97.7 F (36.5 C) (Oral)   Ht 5' 10.5" (1.791 m)   Wt 212 lb (96.2 kg)   BMI 29.99 kg/m   Wt Readings from Last 3 Encounters:  08/29/17 212 lb (96.2 kg)  08/26/17 189 lb (85.7 kg)  08/08/17 189 lb (85.7 kg)    Gen: NAD, alert, cooperative with exam, NCAT EYES: EOMI, no conjunctival injection, or no icterus ENT:  OP without erythema LYMPH: no cervical LAD CV: NRRR, normal S1/S2, no murmur, distal pulses 2+ b/l Resp: CTABL, no wheezes, normal WOB Ext: No edema, warm Neuro: Alert and oriented, strength equal b/l UE and LE, coordination grossly normal MSK: normal muscle bulk  Assessment & Plan:  Jeffrey Hooper was seen today for follow-up multiple med problems  Diagnoses and all orders for this visit:  Pain of  right lower extremity S/p back surgery 3 days ago Has been able to decrease hydrocodone to 2-3 tabs daily Plan to continue to decrease, take as needed. Additional Rx for #30 tabs given  Has f/u with NSG mid-feb -     HYDROcodone-acetaminophen (NORCO/VICODIN) 5-325 MG tablet; Take 1 tablet by mouth every 6 (six) hours as needed for severe pain ((score 7 to 10)).  Tinnitus of both ears Ongoing, diazepam not helping, pt stopped Trying to keep radio or other sound on  Hyperlipidemia, unspecified hyperlipidemia type Stable, cont meds  Essential hypertension Stable, cont meds  Follow up plan: Return in about 3 months (around 11/27/2017). Assunta Found, MD [place]

## 2017-08-29 NOTE — Telephone Encounter (Signed)
See note from 08-22-17

## 2017-09-18 DIAGNOSIS — M5417 Radiculopathy, lumbosacral region: Secondary | ICD-10-CM | POA: Diagnosis not present

## 2017-10-09 ENCOUNTER — Other Ambulatory Visit: Payer: Self-pay | Admitting: Pediatrics

## 2017-10-09 DIAGNOSIS — I10 Essential (primary) hypertension: Secondary | ICD-10-CM

## 2017-10-22 ENCOUNTER — Other Ambulatory Visit: Payer: Self-pay | Admitting: Pediatrics

## 2017-10-22 ENCOUNTER — Other Ambulatory Visit: Payer: Self-pay | Admitting: Neurological Surgery

## 2017-10-22 DIAGNOSIS — K219 Gastro-esophageal reflux disease without esophagitis: Secondary | ICD-10-CM

## 2017-10-25 ENCOUNTER — Telehealth: Payer: Self-pay

## 2017-10-25 NOTE — Telephone Encounter (Signed)
     [facility] Pre-operative Risk Assessment    Request for surgical clearance:  1. What type of surgery is being performed? R-L2 - L4 PEDICLE SCREW REMOVAL WITH METREX 12-27-2017   2. When is this surgery scheduled? 12-27-17   3. What type of clearance is required (medical clearance vs. Pharmacy clearance to hold med vs. Both)? MEDICAL  4. Are there any medications that need to be held prior to surgery and how long? NONE LISTED   5. Practice name and name of physician performing surgery?  [facility] DR Kristeen Miss  6. What is your office phone and fax number? [phone] FX [phone]   7. Anesthesia type (None, local, MAC, general) ? NONE LISTED   Waylan Rocher 10/25/2017, 7:36 AM  _________________________________________________________________   (provider comments below)

## 2017-10-25 NOTE — Telephone Encounter (Signed)
   Primary Cardiologist:James Hochrein, MD  Chart reviewed as part of pre-operative protocol coverage.   Patient has a hx of coronary artery disease s/p drug eluting stent to the LAD in 11/2015.  He needs lumbar spine surgery in May 2019.  This will be his 3rd surgery since October 2018.  He has an appointment with Dr. [name] 10/28/17.  Clearance for his procedure can be given at that appointment.   Richardson Dopp, PA-C  10/25/2017, 12:03 PM

## 2017-10-26 NOTE — Progress Notes (Signed)
.    Cardiology Office Note   Date:  10/28/2017   ID:  Jeffrey Hooper, DOB 07/22/1961, MRN [id number]  PCP:  [name], MD  Cardiologist:   Minus Breeding, MD   Chief Complaint  Patient presents with  . Coronary Artery Disease      History of Present Illness: Jeffrey Hooper is a 57 y.o. male who presents follow up of CAD.  Stress perfusion study in 2017 suggested a moderate sized defect in the inferior wall with ischemia. The EF was well-preserved.  The patient presented to [facility] on 12/02/15 for cath and was found to have single vessel obstructive CAD involving the mid LAD. He underwent successful PCI with a mid LAD DES.  He was placed on DAPT.  Plavix was discontinued after the appropriate time and prior to a back surgery.  His most recent back surgery was in January.  He is going to have another one in May.  Is very limited by back pain and pain that travels down his legs.  He is applying for disability.  With what he can do he is not experiencing any chest pressure, neck or arm discomfort.  He has some occasional palpitations but these have not been particularly symptomatic and is not having any presyncope or syncope.  He has had no new shortness of breath, PND or orthopnea.  He is had no weight gain or edema.   Past Medical History:  Diagnosis Date  . Allergy   . Anxiety   . Arthritis   . CAD (coronary artery disease), native coronary artery 12/03/2015  . Coronary artery disease   . Dysrhythmia    "irregular and PAC'S"  . GERD (gastroesophageal reflux disease)   . Head injury with loss of consciousness ([place])   . History of kidney stones   . Hyperlipidemia   . Hypertension   . Hypothyroid   . Multiple benign nevi   . Neuromuscular disorder ([place]) 2011   LEFT RADIAL NERVE SURGERY R/T TRAUMA  . Pneumonia 2014, 2017  . Prostatism   . Pulmonary nodule     Past Surgical History:  Procedure Laterality Date  . BACK SURGERY     T12 - L1  . CARDIAC CATHETERIZATION N/A  12/02/2015   Procedure: Left Heart Cath and Coronary Angiography;  Surgeon: Peter M Martinique, MD;  Location: [place] [facility];  Service: Cardiovascular;  Laterality: N/A;  . CARDIAC CATHETERIZATION N/A 12/02/2015   Procedure: Coronary Stent Intervention;  Surgeon: Peter M Martinique, MD;  Location: [place] [facility];  Service: Cardiovascular;  Laterality: N/A;  mid LAD Promus 2.5x12  . CORONARY ANGIOPLASTY    . HARDWARE REMOVAL Right 08/26/2017   Procedure: Right Lumbar Five Revision of pedicle screw with Removal of Lumbar Five Screw;  Surgeon: Kristeen Miss, MD;  Location: [facility];  Service: Neurosurgery;  Laterality: Right;  posterior  . MASS EXCISION  06/25/2012   Procedure: EXCISION MASS;  Surgeon: Wynonia Sours, MD;  Location: [facility];  Service: Orthopedics;  Laterality: Left;  transection of NEUROMA, BURYING RADIAL NERVE IN BRACHIORADIALIS LEFT SIDE  . radial nerve Left    cut in work injury  . SPINAL FUSION     C6-7  . TONSILLECTOMY  1982  . VASECTOMY       Current Outpatient Medications  Medication Sig Dispense Refill  . acetaminophen (TYLENOL) 500 MG tablet Take 1,000 mg by mouth every 6 (six) hours as needed for mild pain or headache.     [name]  amLODipine (NORVASC) 5 MG tablet TAKE 1 TABLET (5 MG TOTAL) BY MOUTH DAILY. 90 tablet 0  . aspirin EC 81 MG tablet Take 81 mg by mouth at bedtime.    [name] atorvastatin (LIPITOR) 80 MG tablet Take 1 tablet (80 mg total) by mouth at bedtime. 90 tablet 1  . diphenhydrAMINE (BENADRYL) 25 MG tablet Take 50 mg by mouth 2 (two) times daily as needed for itching.     . fluticasone (FLONASE) 50 MCG/ACT nasal spray PLACE 2 SPRAYS INTO BOTH NOSTRILS DAILY. 16 g 5  . gabapentin (NEURONTIN) 300 MG capsule Take 300 mg by mouth 4 (four) times daily.   2  . hydrochlorothiazide (HYDRODIURIL) 25 MG tablet TAKE 1 TABLET (25 MG TOTAL) BY MOUTH DAILY. 90 tablet 1  . HYDROcodone-acetaminophen (NORCO/VICODIN) 5-325 MG tablet Take 1 tablet by mouth every 6  (six) hours as needed for severe pain ((score 7 to 10)). 30 tablet 0  . levocetirizine (XYZAL) 5 MG tablet Take 1 tablet (5 mg total) by mouth every evening. 90 tablet 3  . levothyroxine (SYNTHROID, LEVOTHROID) 100 MCG tablet Take 1 tablet (100 mcg total) by mouth daily. 90 tablet 2  . Melatonin 10 MG TABS Take 10 mg by mouth at bedtime as needed (sleep).     . methocarbamol (ROBAXIN) 500 MG tablet Take 1 tablet (500 mg total) by mouth every 6 (six) hours as needed for muscle spasms. 40 tablet 3  . metoprolol tartrate (LOPRESSOR) 25 MG tablet TAKE 1/2 TABLET (12.5 MG TOTAL) BY MOUTH 2 (TWO) TIMES DAILY. 90 tablet 1  . nitroGLYCERIN (NITROSTAT) 0.4 MG SL tablet Place 1 tablet (0.4 mg total) under the tongue every 5 (five) minutes as needed for chest pain. 20 tablet 2  . pantoprazole (PROTONIX) 40 MG tablet TAKE 1 TABLET BY MOUTH EVERY DAY 30 tablet 3   No current facility-administered medications for this visit.     Allergies:   Alpha-gal; Beef extract; Beef-derived products; Lambs quarters; Pork-derived products; Prednisone; Brilinta [ticagrelor]; Doxycycline; Doxycycline hyclate; Penicillins; Shellfish allergy; Adhesive [tape]; Fentanyl; and Penicillin g    ROS:  Please see the history of present illness.   Otherwise, review of systems are positive for none.   All other systems are reviewed and negative.    PHYSICAL EXAM: VS:  BP 112/62   Ht 5' 10.5" (1.791 m)   Wt 212 lb (96.2 kg)   BMI 29.99 kg/m  , BMI Body mass index is 29.99 kg/m.  GENERAL:  Well appearing NECK:  No jugular venous distention, waveform within normal limits, carotid upstroke brisk and symmetric, no bruits, no thyromegaly LUNGS:  Clear to auscultation bilaterally CHEST:  Unremarkable HEART:  PMI not displaced or sustained,S1 and S2 within normal limits, no S3, no S4, no clicks, no rubs, none murmurs ABD:  Flat, positive bowel sounds normal in frequency in pitch, no bruits, no rebound, no guarding, no midline  pulsatile mass, no hepatomegaly, no splenomegaly EXT:  2 plus pulses throughout, no edema, no cyanosis no clubbing   EKG:  EKG  ordered today. Sinus rhythm, rate 50, axis within normal limits, intervals within normal limits, no acute ST-T wave changes.  Recent Labs: 08/26/2017: BUN 11; Creatinine, Ser 1.13; Hemoglobin 11.7; Platelets 231; Potassium 4.0; Sodium 141    Lipid Panel    Component Value Date/Time   CHOL 129 09/12/2016 1324   CHOL 136 10/29/2012 0922   TRIG 119 09/12/2016 1324   TRIG 139 04/28/2013 1505   TRIG 103 10/29/2012 0922  HDL 47 09/12/2016 1324   HDL 44 04/28/2013 1505   HDL 41 10/29/2012 0922   CHOLHDL 2.7 09/12/2016 1324   LDLCALC 58 09/12/2016 1324   LDLCALC 50 04/28/2013 1505   LDLCALC 74 10/29/2012 0922      Wt Readings from Last 3 Encounters:  10/28/17 212 lb (96.2 kg)  08/29/17 212 lb (96.2 kg)  08/26/17 189 lb (85.7 kg)      Other studies Reviewed: Additional studies/ records that were reviewed today include: None Review of the above records demonstrates:    ASSESSMENT AND PLAN:   CAD S/P percutaneous coronary angioplasty The patient has no new sypmtoms.  No further cardiovascular testing is indicated.  We will continue with aggressive risk reduction and meds as listed.  Preop I do have a phone call into, and spoke with a nurse, at the neurosurgery office.  I would like to understand more fully the risk of surgery if he is to remain on ASA during this surgery.  It is not clear to me that he stopped it for the previous surgery.  If it is felt to be lower risk I would suggest continuing ASA during surgery.  Hold ASA if the risk is prohibitive.    Essential hypertension The blood pressure is at target. No change in medications is indicated. We will continue with therapeutic lifestyle changes (TLC).  Palpitations These are not particularly symptomatic.  No change in therapy.  He tolerates the bradycardia with his low-dose of  beta-blocker.  Dyslipidemia His LDL was 58 with an HDL of 47.  No change in therapy.  Bradycardia As above.    Current medicines are reviewed at length with the patient today.  The patient does not have concerns regarding medicines.  The following changes have been made:  None  Labs/ tests ordered today include:    None  Orders Placed This Encounter  Procedures  . EKG 12-Lead     Disposition:   FU with me in 12 months.    Signed, Minus Breeding, MD  10/28/2017 12:32 PM    [facility]

## 2017-10-28 ENCOUNTER — Ambulatory Visit: Payer: BLUE CROSS/BLUE SHIELD | Admitting: Cardiology

## 2017-10-28 ENCOUNTER — Encounter: Payer: Self-pay | Admitting: Cardiology

## 2017-10-28 VITALS — BP 112/62 | Ht 70.5 in | Wt 212.0 lb

## 2017-10-28 DIAGNOSIS — I2510 Atherosclerotic heart disease of native coronary artery without angina pectoris: Secondary | ICD-10-CM | POA: Diagnosis not present

## 2017-10-28 DIAGNOSIS — I10 Essential (primary) hypertension: Secondary | ICD-10-CM | POA: Diagnosis not present

## 2017-10-28 DIAGNOSIS — R001 Bradycardia, unspecified: Secondary | ICD-10-CM | POA: Diagnosis not present

## 2017-10-28 DIAGNOSIS — E785 Hyperlipidemia, unspecified: Secondary | ICD-10-CM | POA: Diagnosis not present

## 2017-10-28 NOTE — Patient Instructions (Signed)
Medication Instructions:  Continue current medications  If you need a refill on your cardiac medications before your next appointment, please call your pharmacy.  Labwork: None Ordered   Testing/Procedures: None Ordered  Follow-Up: Your physician wants you to follow-up in: 1 Year. You should receive a reminder letter in the mail two months in advance. If you do not receive a letter, please call our office [phone].    Thank you for choosing [facility]!!      

## 2017-11-22 ENCOUNTER — Other Ambulatory Visit: Payer: Self-pay | Admitting: Pediatrics

## 2017-11-27 ENCOUNTER — Ambulatory Visit: Payer: BLUE CROSS/BLUE SHIELD | Admitting: Pediatrics

## 2017-11-27 ENCOUNTER — Encounter: Payer: Self-pay | Admitting: Pediatrics

## 2017-11-27 VITALS — BP 125/78 | HR 47 | Temp 97.6°F | Ht 70.5 in | Wt 209.4 lb

## 2017-11-27 DIAGNOSIS — L309 Dermatitis, unspecified: Secondary | ICD-10-CM | POA: Diagnosis not present

## 2017-11-27 DIAGNOSIS — J3089 Other allergic rhinitis: Secondary | ICD-10-CM | POA: Diagnosis not present

## 2017-11-27 DIAGNOSIS — E039 Hypothyroidism, unspecified: Secondary | ICD-10-CM

## 2017-11-27 DIAGNOSIS — Z9861 Coronary angioplasty status: Secondary | ICD-10-CM

## 2017-11-27 DIAGNOSIS — R609 Edema, unspecified: Secondary | ICD-10-CM

## 2017-11-27 DIAGNOSIS — Z125 Encounter for screening for malignant neoplasm of prostate: Secondary | ICD-10-CM

## 2017-11-27 DIAGNOSIS — I2510 Atherosclerotic heart disease of native coronary artery without angina pectoris: Secondary | ICD-10-CM

## 2017-11-27 DIAGNOSIS — I10 Essential (primary) hypertension: Secondary | ICD-10-CM | POA: Diagnosis not present

## 2017-11-27 DIAGNOSIS — E785 Hyperlipidemia, unspecified: Secondary | ICD-10-CM

## 2017-11-27 DIAGNOSIS — K219 Gastro-esophageal reflux disease without esophagitis: Secondary | ICD-10-CM

## 2017-11-27 MED ORDER — AMLODIPINE BESYLATE 5 MG PO TABS: 5.0000 mg | ORAL_TABLET | Freq: Every day | ORAL | 2 refills | Status: DC

## 2017-11-27 MED ORDER — PANTOPRAZOLE SODIUM 40 MG PO TBEC: 40.0000 mg | DELAYED_RELEASE_TABLET | Freq: Every day | ORAL | 3 refills | Status: DC

## 2017-11-27 MED ORDER — LEVOTHYROXINE SODIUM 100 MCG PO TABS: 100.0000 ug | ORAL_TABLET | Freq: Every day | ORAL | 2 refills | Status: DC

## 2017-11-27 MED ORDER — TRIAMCINOLONE ACETONIDE 0.025 % EX OINT: 1.0000 "application " | TOPICAL_OINTMENT | Freq: Two times a day (BID) | CUTANEOUS | 0 refills | Status: DC

## 2017-11-27 MED ORDER — ATORVASTATIN CALCIUM 80 MG PO TABS: 80.0000 mg | ORAL_TABLET | Freq: Every day | ORAL | 2 refills | Status: DC

## 2017-11-27 MED ORDER — METOPROLOL TARTRATE 25 MG PO TABS: ORAL_TABLET | ORAL | 2 refills | Status: DC

## 2017-11-27 MED ORDER — LEVOCETIRIZINE DIHYDROCHLORIDE 5 MG PO TABS: 5.0000 mg | ORAL_TABLET | Freq: Every evening | ORAL | 3 refills | Status: DC

## 2017-11-27 MED ORDER — FUROSEMIDE 20 MG PO TABS: 20.0000 mg | ORAL_TABLET | Freq: Every day | ORAL | 1 refills | Status: DC | PRN

## 2017-11-27 NOTE — Progress Notes (Signed)
Subjective:   Patient ID: Jeffrey Hooper, male    DOB: 1961-04-08, 57 y.o.   MRN: [id number] CC: Follow-up (3 month) Medical problems  HPI: Jeffrey Hooper is a 57 y.o. male presenting for Follow-up (3 month)  Back pain: Surgery scheduled in 1 month to remove screws per patient.  Continues to limit his daily activities due to pain.  Swelling: Both ankles with more swelling some days when he is been up on his feet, thinks is been ongoing for the last 3 months since last back surgery.  Some days worse than others.  Worse at the end of the day.  Usually better by the morning but not always.  No new shortness of breath, any chest pain.  Cell cardiology about a month ago.  Hypothyroid: Taking medicine regularly  Hypothyroidism: Taking medicine regularly  No family history of prostate cancer.  Would like prostate cancer screening.  Rash: Scaling itching area left upper arm.  Improved with over-the-counter hydrocortisone.  Still somewhat there.  Relevant past medical, surgical, family and social history reviewed. Allergies and medications reviewed and updated. Social History   Tobacco Use  Smoking Status Former Smoker  . Packs/day: 1.00  . Years: 20.00  . Pack years: 20.00  . Types: Cigarettes  . Last attempt to quit: 11/28/2006  . Years since quitting: 11.0  Smokeless Tobacco Never Used   ROS: Per HPI   Objective:    BP 125/78   Pulse (!) 47   Temp 97.6 F (36.4 C) (Oral)   Ht 5' 10.5" (1.791 m)   Wt 209 lb 6.4 oz (95 kg)   BMI 29.62 kg/m   Wt Readings from Last 3 Encounters:  11/27/17 209 lb 6.4 oz (95 kg)  10/28/17 212 lb (96.2 kg)  08/29/17 212 lb (96.2 kg)    Gen: NAD, alert, cooperative with exam, NCAT EYES: EOMI, no conjunctival injection, or no icterus ENT:  TMs pearly gray b/l, OP without erythema LYMPH: no cervical LAD CV: NRRR, normal S1/S2, no murmur, distal pulses 2+ b/l Resp: CTABL, no wheezes, normal WOB Abd: +BS, soft, NTND. no guarding or  organomegaly Ext:  trace pitting edema bilateral ankle Neuro: Alert and oriented, strength equal b/l UE and LE, coordination grossly normal MSK: normal muscle bulk Skin: Patchy scaly upper left arm.  No redness.  Assessment & Plan:  Jeffrey Hooper was seen today for follow-up medical problems.  Diagnoses and all orders for this visit:  Eczema, unspecified type Trial below.  If not improving let me know. -     triamcinolone (KENALOG) 0.025 % ointment; Apply 1 application topically 2 (two) times daily.  Essential hypertension -     amLODipine (NORVASC) 5 MG tablet; Take 1 tablet (5 mg total) by mouth daily. -     Basic Metabolic Panel  Hyperlipidemia, unspecified hyperlipidemia type -     atorvastatin (LIPITOR) 80 MG tablet; Take 1 tablet (80 mg total) by mouth at bedtime.  Perennial allergic rhinitis -     levocetirizine (XYZAL) 5 MG tablet; Take 1 tablet (5 mg total) by mouth every evening.  Hypothyroidism, unspecified type -     levothyroxine (SYNTHROID, LEVOTHROID) 100 MCG tablet; Take 1 tablet (100 mcg total) by mouth daily. -     TSH  CAD S/P percutaneous coronary angioplasty -     metoprolol tartrate (LOPRESSOR) 25 MG tablet; TAKE 1/2 TABLET (12.5 MG TOTAL) BY MOUTH 2 (TWO) TIMES DAILY.  Gastroesophageal reflux disease, esophagitis presence not specified -  pantoprazole (PROTONIX) 40 MG tablet; Take 1 tablet (40 mg total) by mouth daily.  Screening for prostate cancer -     PSA, total and free  Swelling No shortness of breath, chest pain.  Worse at the end of the day.  Try to keep feet propped up, walk when able.  Use Lasix if swelling still present the next morning.  If happening regularly let me know. -     furosemide (LASIX) 20 MG tablet; Take 1 tablet (20 mg total) by mouth daily as needed.   Follow up plan: Return in about 3 months (around 02/26/2018). Assunta Found, MD [place]

## 2017-11-28 LAB — BASIC METABOLIC PANEL
BUN/Creatinine Ratio: 15 (ref 9–20)
BUN: 14 mg/dL (ref 6–24)
CO2: 28 mmol/L (ref 20–29)
CREATININE: 0.95 mg/dL (ref 0.76–1.27)
Calcium: 9.5 mg/dL (ref 8.7–10.2)
Chloride: 102 mmol/L (ref 96–106)
GFR calc Af Amer: 103 mL/min/{1.73_m2} (ref >59)
GFR calc non Af Amer: 89 mL/min/{1.73_m2} (ref >59)
GLUCOSE: 87 mg/dL (ref 65–99)
Potassium: 4 mmol/L (ref 3.5–5.2)
Sodium: 143 mmol/L (ref 134–144)

## 2017-11-28 LAB — PSA, TOTAL AND FREE
PSA, Free Pct: 40 %
PSA, Free: 0.44 ng/mL
Prostate Specific Ag, Serum: 1.1 ng/mL (ref 0.0–4.0)

## 2017-11-28 LAB — TSH: TSH: 3.74 u[IU]/mL (ref 0.450–4.500)

## 2017-12-18 NOTE — Pre-Procedure Instructions (Signed)
Jeffrey Hooper  12/18/2017      [id number] [place], [place] - [place] [address] Phone: [phone] Fax: [phone]    Your procedure is scheduled on Friday May 24.  Report to [facility] Admitting at 12:30 A.M.  Call this number if you have problems the morning of surgery:  [phone]   Remember:  Do not eat food or drink liquids after midnight.  Take these medicines the morning of surgery with A SIP OF WATER:   Amlodipine (norvasc) Metoprolol (lopressor) levocetirizine (Xyzal) Levothyroxine (synthroid) Pantoprazole (protonix) Flonase Gabapentin (neurontin) Hydrocodone-acetaminophen (norco) if needed OR Tylenol if needed  7 days prior to surgery STOP taking any Aspirin(unless otherwise instructed by your surgeon), Aleve, Naproxen, Ibuprofen, Motrin, Advil, Goody's, BC's, all herbal medications, fish oil, and all vitamins   Do not wear jewelry, make-up or nail polish.  Do not wear lotions, powders, or perfumes, or deodorant.  Do not shave 48 hours prior to surgery.  Men may shave face and neck.  Do not bring valuables to the hospital.  [facility] is not responsible for any belongings or valuables.  Contacts, dentures or bridgework may not be worn into surgery.  Leave your suitcase in the car.  After surgery it may be brought to your room.  For patients admitted to the hospital, discharge time will be determined by your treatment team.  Patients discharged the day of surgery will not be allowed to drive home.   Special instructions:    [facility]- Preparing For Surgery  Before surgery, you can play an important role. Because skin is not sterile, your skin needs to be as free of germs as possible. You can reduce the number of germs on your skin by washing with CHG (chlorahexidine gluconate) Soap before surgery.  CHG is an antiseptic cleaner which kills germs and bonds with the skin to continue  killing germs even after washing.  Oral Hygiene is also important to reduce your risk of infection.  Remember - BRUSH YOUR TEETH THE MORNING OF SURGERY  Please do not use if you have an allergy to CHG or antibacterial soaps. If your skin becomes reddened/irritated stop using the CHG.  Do not shave (including legs and underarms) for at least 48 hours prior to first CHG shower. It is OK to shave your face.  Please follow these instructions carefully.   1. Shower the NIGHT BEFORE SURGERY and the MORNING OF SURGERY with CHG.   2. If you chose to wash your hair, wash your hair first as usual with your normal shampoo.  3. After you shampoo, rinse your hair and body thoroughly to remove the shampoo.  4. Use CHG as you would any other liquid soap. You can apply CHG directly to the skin and wash gently with a scrungie or a clean washcloth.   5. Apply the CHG Soap to your body ONLY FROM THE NECK DOWN.  Do not use on open wounds or open sores. Avoid contact with your eyes, ears, mouth and genitals (private parts). Wash Face and genitals (private parts)  with your normal soap.  6. Wash thoroughly, paying special attention to the area where your surgery will be performed.  7. Thoroughly rinse your body with warm water from the neck down.  8. DO NOT shower/wash with your normal soap after using and rinsing off the CHG Soap.  9. Pat yourself dry with a CLEAN TOWEL.  10. Wear  CLEAN PAJAMAS to bed the night before surgery, wear comfortable clothes the morning of surgery  11. Place CLEAN SHEETS on your bed the night of your first shower and DO NOT SLEEP WITH PETS.    Day of Surgery:  Do not apply any deodorants/lotions.  Please wear clean clothes to the hospital/surgery center.   Remember to brush your teeth.      Please read over the following fact sheets that you were given. Coughing and Deep Breathing, MRSA Information and Surgical Site Infection Prevention

## 2017-12-19 ENCOUNTER — Encounter (HOSPITAL_COMMUNITY)
Admission: RE | Admit: 2017-12-19 | Discharge: 2017-12-19 | Disposition: A | Discharge status: Home / Self Care | Payer: BLUE CROSS/BLUE SHIELD | Source: Ambulatory Visit | Attending: Neurological Surgery | Admitting: Neurological Surgery

## 2017-12-19 ENCOUNTER — Other Ambulatory Visit: Payer: Self-pay | Admitting: Pediatrics

## 2017-12-19 ENCOUNTER — Other Ambulatory Visit: Payer: Self-pay

## 2017-12-19 ENCOUNTER — Encounter (HOSPITAL_COMMUNITY): Payer: Self-pay

## 2017-12-19 DIAGNOSIS — K219 Gastro-esophageal reflux disease without esophagitis: Secondary | ICD-10-CM | POA: Diagnosis not present

## 2017-12-19 DIAGNOSIS — Z7951 Long term (current) use of inhaled steroids: Secondary | ICD-10-CM | POA: Insufficient documentation

## 2017-12-19 DIAGNOSIS — Z79890 Hormone replacement therapy (postmenopausal): Secondary | ICD-10-CM | POA: Insufficient documentation

## 2017-12-19 DIAGNOSIS — I10 Essential (primary) hypertension: Secondary | ICD-10-CM | POA: Insufficient documentation

## 2017-12-19 DIAGNOSIS — Z87442 Personal history of urinary calculi: Secondary | ICD-10-CM | POA: Diagnosis not present

## 2017-12-19 DIAGNOSIS — Z981 Arthrodesis status: Secondary | ICD-10-CM | POA: Insufficient documentation

## 2017-12-19 DIAGNOSIS — Z955 Presence of coronary angioplasty implant and graft: Secondary | ICD-10-CM | POA: Insufficient documentation

## 2017-12-19 DIAGNOSIS — Z79899 Other long term (current) drug therapy: Secondary | ICD-10-CM | POA: Diagnosis not present

## 2017-12-19 DIAGNOSIS — Z8701 Personal history of pneumonia (recurrent): Secondary | ICD-10-CM | POA: Diagnosis not present

## 2017-12-19 DIAGNOSIS — R609 Edema, unspecified: Secondary | ICD-10-CM

## 2017-12-19 DIAGNOSIS — E039 Hypothyroidism, unspecified: Secondary | ICD-10-CM | POA: Diagnosis not present

## 2017-12-19 DIAGNOSIS — E785 Hyperlipidemia, unspecified: Secondary | ICD-10-CM | POA: Diagnosis not present

## 2017-12-19 DIAGNOSIS — Z01812 Encounter for preprocedural laboratory examination: Secondary | ICD-10-CM | POA: Diagnosis not present

## 2017-12-19 DIAGNOSIS — M5417 Radiculopathy, lumbosacral region: Secondary | ICD-10-CM | POA: Diagnosis not present

## 2017-12-19 DIAGNOSIS — Z7982 Long term (current) use of aspirin: Secondary | ICD-10-CM | POA: Insufficient documentation

## 2017-12-19 DIAGNOSIS — I2510 Atherosclerotic heart disease of native coronary artery without angina pectoris: Secondary | ICD-10-CM | POA: Diagnosis not present

## 2017-12-19 LAB — BASIC METABOLIC PANEL
Anion gap: 10 (ref 5–15)
BUN: 12 mg/dL (ref 6–20)
CHLORIDE: 102 mmol/L (ref 101–111)
CO2: 28 mmol/L (ref 22–32)
CREATININE: 1 mg/dL (ref 0.61–1.24)
Calcium: 9.1 mg/dL (ref 8.9–10.3)
GFR calc Af Amer: >60 mL/min (ref >60)
GFR calc non Af Amer: >60 mL/min (ref >60)
GLUCOSE: 97 mg/dL (ref 65–99)
POTASSIUM: 3.7 mmol/L (ref 3.5–5.1)
SODIUM: 140 mmol/L (ref 135–145)

## 2017-12-19 LAB — CBC
HCT: 39 % (ref 39.0–52.0)
HEMOGLOBIN: 12.4 g/dL — AB (ref 13.0–17.0)
MCH: 26.3 pg (ref 26.0–34.0)
MCHC: 31.8 g/dL (ref 30.0–36.0)
MCV: 82.6 fL (ref 78.0–100.0)
Platelets: 243 10*3/uL (ref 150–400)
RBC: 4.72 MIL/uL (ref 4.22–5.81)
RDW: 15.2 % (ref 11.5–15.5)
WBC: 6.5 10*3/uL (ref 4.0–10.5)

## 2017-12-19 LAB — SURGICAL PCR SCREEN
MRSA, PCR: NEGATIVE
Staphylococcus aureus: NEGATIVE

## 2017-12-19 NOTE — Pre-Procedure Instructions (Signed)
Jeffrey Hooper  12/19/2017      [id number] [place] [place] - [place] [address] Phone: [phone] Fax: [phone]    Your procedure is scheduled on Friday May 24.   Report to [facility] Admitting at 12:30 pm             (posted surgery time 2:34p - 4:11p)   Call this number if you have problems the morning of surgery:  [phone]   Remember:   Do not eat food or drink liquids after midnight, Thursday.   Take these medicines the morning of surgery with A SIP OF WATER:   Amlodipine (norvasc) Metoprolol (lopressor) levocetirizine (Xyzal) Levothyroxine (synthroid) Pantoprazole (protonix) Flonase Gabapentin (neurontin) Hydrocodone-acetaminophen (norco) if needed OR Tylenol if needed  7 days prior to surgery STOP taking any Aspirin(unless otherwise instructed by your surgeon), Aleve, Naproxen, Ibuprofen, Motrin, Advil, Goody's, BC's, all herbal medications, fish oil, and all vitamins   Do not wear jewelry - no rings or eacths.  Do not wear lotions, colognes or deodorant.  Men may shave face and neck.  Do not bring valuables to the hospital.  [facility] is not responsible for any belongings or valuables.  Contacts, dentures or bridgework may not be worn into surgery.  Leave your suitcase in the car.  After surgery it may be brought to your room.  For patients admitted to the hospital, discharge time will be determined by your treatment team.    [facility]- Preparing For Surgery  Before surgery, you can play an important role. Because skin is not sterile, your skin needs to be as free of germs as possible. You can reduce the number of germs on your skin by washing with CHG (chlorahexidine gluconate) Soap before surgery.  CHG is an antiseptic cleaner which kills germs and bonds with the skin to continue killing germs even after washing.  Oral Hygiene is also important to reduce your risk of  infection.  Remember - BRUSH YOUR TEETH THE MORNING OF SURGERY  Please do not use if you have an allergy to CHG or antibacterial soaps. If your skin becomes reddened/irritated stop using the CHG.  Do not shave (including legs and underarms) for at least 48 hours prior to first CHG shower. It is OK to shave your face.  Please follow these instructions carefully.   1. Shower the NIGHT BEFORE SURGERY and the MORNING OF SURGERY with CHG.   2. If you chose to wash your hair, wash your hair first as usual with your normal shampoo.  3. After you shampoo, rinse your hair and body thoroughly to remove the shampoo.  4. Use CHG as you would any other liquid soap. You can apply CHG directly to the skin and wash gently with a scrungie or a clean washcloth.   5. Apply the CHG Soap to your body ONLY FROM THE NECK DOWN.  Do not use on open wounds or open sores. Avoid contact with your eyes, ears, mouth and genitals (private parts). Wash Face and genitals (private parts)  with your normal soap.  6. Wash thoroughly, paying special attention to the area where your surgery will be performed.  7. Thoroughly rinse your body with warm water from the neck down.  8. DO NOT shower/wash with your normal soap after using and rinsing off the CHG Soap.  9. Pat yourself dry with a CLEAN TOWEL.  10. Wear CLEAN PAJAMAS to bed the  night before surgery, wear comfortable clothes the morning of surgery  11. Place CLEAN SHEETS on your bed the night of your first shower and DO NOT SLEEP WITH PETS.    Day of Surgery:  Do not apply any deodorants/lotions.  Please wear clean clothes to the hospital/surgery center.   Remember to brush your teeth.      Please read over the following fact sheets that you were given. Coughing and Deep Breathing, MRSA Information and Surgical Site Infection Prevention

## 2017-12-19 NOTE — Progress Notes (Signed)
   12/19/17 0831  OBSTRUCTIVE SLEEP APNEA  Have you ever been diagnosed with sleep apnea through a sleep study? No  Do you snore loudly (loud enough to be heard through closed doors)?  0  Do you often feel tired, fatigued, or sleepy during the daytime (such as falling asleep during driving or talking to someone)? 0  Has anyone observed you stop breathing during your sleep? 0  Do you have, or are you being treated for high blood pressure? 1  BMI more than 35 kg/m2? 0  Age > 50 (1-yes) 1  Neck circumference greater than:Male 16 inches or larger, Male 17inches or larger? 1 (79 1/2)  Male Gender (Yes=1) 1  Obstructive Sleep Apnea Score 4  Score 5 or greater  Results sent to PCP

## 2017-12-19 NOTE — Progress Notes (Signed)
Echo 11/2015 Stress 11/2015 Heart Cath 11/2015 Cardio is Dr. Vita Barley  LOV 10/2017 Stopping Plavix on PCP

## 2017-12-19 NOTE — Progress Notes (Addendum)
Echo 11/2015 Stress 11/2015 Heart Cath 11/2015 (done by Dr. P Martinique) Cardio is Dr. Vita Barley  LOV 10/2017 Stopping Plavix in January 2019 per Dr. [name]. Have called Dr. Wallene Huh' office to see when he needs to stop his aspirin -- office called back and said he may keep taking his aspirin up until the day of. (patient made aware) PCP is Assunta Found  LOV  10/2017

## 2017-12-20 DIAGNOSIS — Z0289 Encounter for other administrative examinations: Secondary | ICD-10-CM

## 2017-12-20 NOTE — Progress Notes (Signed)
Anesthesia Chart Review:   Case:  [id number] Date/Time:  12/27/17 1419   Procedure:  Right L2 to L4 Pedicle screw removal with mterex (Right ) - Right L2 to L4 Pedicle screw removal with mterex   Anesthesia type:  General   Pre-op diagnosis:  Radiculoapthy, Lumobasra   Location:  [facility] OR ROOM 63 / [facility]   Surgeon:  Kristeen Miss, MD      DISCUSSION:  - Pt is a 57 year old male with hx CAD (DES to LAD 12/02/15), HTN, hyperlipidemia  - Pt to hold ASA day of surgery   VS: BP 122/67   Pulse (!) 57   Temp 36.5 C   Resp 20   Ht 5\' 10"  (1.778 m)   Wt 211 lb 1.6 oz (95.8 kg)   SpO2 100%   BMI 30.29 kg/m    PROVIDERS: PCP is [name], MD Cardiologist is Minus Breeding, MD. Pt cleared for surgery at last office visit 10/28/17   LABS: Labs reviewed: Acceptable for surgery. (all labs ordered are listed, but only abnormal results are displayed)  Labs Reviewed  CBC - Abnormal; Notable for the following components:      Result Value   Hemoglobin 12.4 (*)    All other components within normal limits  SURGICAL PCR SCREEN  BASIC METABOLIC PANEL     IMAGES:  CXR 07/05/17: Minor bibasilar atelectasis. Otherwise unremarkable chest radiograph   EKG 10/28/17: sinus bradycardia (50 bpm)   CV:  Cardiac cath 12/02/15:   Prox RCA to Mid RCA lesion, 30% stenosed.  Mid LAD-1 lesion, 40% stenosed.  Mid LAD-2 lesion, 95% stenosed. Post intervention, there is a 15% residual stenosis. 1. Single vessel obstructive CAD involving the mid LAD 2. Successful stenting of the Mid LAD with DES.  Echo 11/25/15:  - Left ventricle: The cavity size was normal. Systolic function was normal. The estimated ejection fraction was in the range of 60%to 65%. Wall motion was normal; there were no regional wallmotion abnormalities. - Aortic valve: There was trivial regurgitation. - Mitral valve: There was mild regurgitation. - Left atrium: The atrium was mildly dilated.   Past Medical History:   Diagnosis Date  . Allergy   . Anxiety   . Arthritis   . CAD (coronary artery disease), native coronary artery 12/03/2015  . Coronary artery disease   . Dysrhythmia    "irregular and PAC'S"  . GERD (gastroesophageal reflux disease)   . Head injury with loss of consciousness ([place])    back in the 1980's  . History of kidney stones   . Hyperlipidemia   . Hypertension   . Hypothyroid   . Multiple benign nevi   . Neuromuscular disorder ([place]) 2011   LEFT RADIAL NERVE SURGERY R/T TRAUMA  . Pneumonia 2014, 2017  . Prostatism   . Pulmonary nodule     Past Surgical History:  Procedure Laterality Date  . BACK SURGERY     T12 - L1  . CARDIAC CATHETERIZATION N/A 12/02/2015   Procedure: Left Heart Cath and Coronary Angiography;  Surgeon: Peter M Martinique, MD;  Location: [facility] [facility];  Service: Cardiovascular;  Laterality: N/A;  . CARDIAC CATHETERIZATION N/A 12/02/2015   Procedure: Coronary Stent Intervention;  Surgeon: Peter M Martinique, MD;  Location: [place];  Service: Cardiovascular;  Laterality: N/A;  mid LAD Promus 2.5x12  . CORONARY ANGIOPLASTY     one stent placed by Dr. P Martinique  . HARDWARE REMOVAL Right 08/26/2017  Procedure: Right Lumbar Five Revision of pedicle screw with Removal of Lumbar Five Screw;  Surgeon: Kristeen Miss, MD;  Location: [place];  Service: Neurosurgery;  Laterality: Right;  posterior  . MASS EXCISION  06/25/2012   Procedure: EXCISION MASS;  Surgeon: Wynonia Sours, MD;  Location: [place];  Service: Orthopedics;  Laterality: Left;  transection of NEUROMA, BURYING RADIAL NERVE IN BRACHIORADIALIS LEFT SIDE  . radial nerve Left    cut in work injury  . SPINAL FUSION     C6-7  . TONSILLECTOMY  1982  . VASECTOMY      MEDICATIONS: . acetaminophen (TYLENOL) 500 MG tablet  . amLODipine (NORVASC) 5 MG tablet  . amoxicillin (AMOXIL) 500 MG tablet  . aspirin EC 81 MG tablet  . atorvastatin (LIPITOR) 80 MG tablet  . diphenhydrAMINE  (BENADRYL) 25 MG tablet  . fluticasone (FLONASE) 50 MCG/ACT nasal spray  . furosemide (LASIX) 20 MG tablet  . gabapentin (NEURONTIN) 300 MG capsule  . hydrochlorothiazide (HYDRODIURIL) 25 MG tablet  . HYDROcodone-acetaminophen (NORCO/VICODIN) 5-325 MG tablet  . levocetirizine (XYZAL) 5 MG tablet  . levothyroxine (SYNTHROID, LEVOTHROID) 100 MCG tablet  . metoprolol tartrate (LOPRESSOR) 25 MG tablet  . nitroGLYCERIN (NITROSTAT) 0.4 MG SL tablet  . pantoprazole (PROTONIX) 40 MG tablet  . triamcinolone (KENALOG) 0.025 % ointment   No current facility-administered medications for this encounter.    - Pt to hold ASA day of surgery   Pt tolerated PLIF 05/2017 and lumbar revision 08/26/17 without issue. If no changes, I anticipate pt can proceed with surgery as scheduled.   Willeen Cass, FNP-BC [facility]/Anesthesiology Phone: [phone] 12/20/2017 12:44 PM

## 2017-12-27 ENCOUNTER — Encounter (HOSPITAL_COMMUNITY): Payer: Self-pay | Admitting: Anesthesiology

## 2017-12-27 ENCOUNTER — Ambulatory Visit (HOSPITAL_COMMUNITY)
Admission: RE | Admit: 2017-12-27 | Discharge: 2017-12-27 | Disposition: A | Discharge status: Home / Self Care | Payer: BLUE CROSS/BLUE SHIELD | Source: Ambulatory Visit | Attending: Neurological Surgery | Admitting: Neurological Surgery

## 2017-12-27 ENCOUNTER — Encounter (HOSPITAL_COMMUNITY)
Admission: RE | Disposition: A | Discharge status: Home / Self Care | Payer: Self-pay | Source: Ambulatory Visit | Attending: Neurological Surgery

## 2017-12-27 ENCOUNTER — Ambulatory Visit (HOSPITAL_COMMUNITY): Payer: BLUE CROSS/BLUE SHIELD

## 2017-12-27 ENCOUNTER — Ambulatory Visit (HOSPITAL_COMMUNITY): Payer: BLUE CROSS/BLUE SHIELD | Admitting: Anesthesiology

## 2017-12-27 ENCOUNTER — Other Ambulatory Visit: Payer: Self-pay

## 2017-12-27 ENCOUNTER — Ambulatory Visit (HOSPITAL_COMMUNITY): Payer: BLUE CROSS/BLUE SHIELD | Admitting: Vascular Surgery

## 2017-12-27 DIAGNOSIS — Z79899 Other long term (current) drug therapy: Secondary | ICD-10-CM | POA: Insufficient documentation

## 2017-12-27 DIAGNOSIS — I10 Essential (primary) hypertension: Secondary | ICD-10-CM | POA: Insufficient documentation

## 2017-12-27 DIAGNOSIS — K219 Gastro-esophageal reflux disease without esophagitis: Secondary | ICD-10-CM | POA: Insufficient documentation

## 2017-12-27 DIAGNOSIS — I2510 Atherosclerotic heart disease of native coronary artery without angina pectoris: Secondary | ICD-10-CM | POA: Insufficient documentation

## 2017-12-27 DIAGNOSIS — M5416 Radiculopathy, lumbar region: Secondary | ICD-10-CM | POA: Diagnosis not present

## 2017-12-27 DIAGNOSIS — Z7982 Long term (current) use of aspirin: Secondary | ICD-10-CM | POA: Insufficient documentation

## 2017-12-27 DIAGNOSIS — T84226A Displacement of internal fixation device of vertebrae, initial encounter: Secondary | ICD-10-CM | POA: Insufficient documentation

## 2017-12-27 DIAGNOSIS — Z955 Presence of coronary angioplasty implant and graft: Secondary | ICD-10-CM | POA: Insufficient documentation

## 2017-12-27 DIAGNOSIS — Z981 Arthrodesis status: Secondary | ICD-10-CM | POA: Diagnosis not present

## 2017-12-27 DIAGNOSIS — E785 Hyperlipidemia, unspecified: Secondary | ICD-10-CM | POA: Insufficient documentation

## 2017-12-27 DIAGNOSIS — T84296A Other mechanical complication of internal fixation device of vertebrae, initial encounter: Secondary | ICD-10-CM | POA: Diagnosis not present

## 2017-12-27 DIAGNOSIS — Y838 Other surgical procedures as the cause of abnormal reaction of the patient, or of later complication, without mention of misadventure at the time of the procedure: Secondary | ICD-10-CM | POA: Diagnosis not present

## 2017-12-27 DIAGNOSIS — Z79890 Hormone replacement therapy (postmenopausal): Secondary | ICD-10-CM | POA: Diagnosis not present

## 2017-12-27 DIAGNOSIS — Z7951 Long term (current) use of inhaled steroids: Secondary | ICD-10-CM | POA: Diagnosis not present

## 2017-12-27 DIAGNOSIS — Z87891 Personal history of nicotine dependence: Secondary | ICD-10-CM | POA: Diagnosis not present

## 2017-12-27 DIAGNOSIS — Z419 Encounter for procedure for purposes other than remedying health state, unspecified: Secondary | ICD-10-CM

## 2017-12-27 DIAGNOSIS — E039 Hypothyroidism, unspecified: Secondary | ICD-10-CM | POA: Diagnosis not present

## 2017-12-27 HISTORY — PX: HARDWARE REMOVAL: SHX979

## 2017-12-27 SURGERY — REMOVAL, HARDWARE
Anesthesia: General | Site: Spine Lumbar | Laterality: Right

## 2017-12-27 MED ORDER — DEXAMETHASONE SODIUM PHOSPHATE 10 MG/ML IJ SOLN
INTRAMUSCULAR | Status: AC
  Filled 2017-12-27: qty 2

## 2017-12-27 MED ORDER — LIDOCAINE 2% (20 MG/ML) 5 ML SYRINGE
INTRAMUSCULAR | Status: AC
  Filled 2017-12-27: qty 10

## 2017-12-27 MED ORDER — SUGAMMADEX SODIUM 200 MG/2ML IV SOLN
INTRAVENOUS | Status: AC
  Filled 2017-12-27: qty 2

## 2017-12-27 MED ORDER — FLEET ENEMA 7-19 GM/118ML RE ENEM: 1.0000 | ENEMA | Freq: Once | RECTAL | Status: DC | PRN

## 2017-12-27 MED ORDER — ONDANSETRON HCL 4 MG/2ML IJ SOLN
INTRAMUSCULAR | Status: AC
  Filled 2017-12-27: qty 2

## 2017-12-27 MED ORDER — DIPHENHYDRAMINE HCL 25 MG PO TABS
50.0000 mg | ORAL_TABLET | Freq: Two times a day (BID) | ORAL | Status: DC | PRN
  Filled 2017-12-27: qty 2

## 2017-12-27 MED ORDER — METHOCARBAMOL 500 MG PO TABS
ORAL_TABLET | ORAL | Status: AC
  Filled 2017-12-27: qty 1

## 2017-12-27 MED ORDER — MENTHOL 3 MG MT LOZG: 1.0000 | LOZENGE | OROMUCOSAL | Status: DC | PRN

## 2017-12-27 MED ORDER — SUCCINYLCHOLINE CHLORIDE 200 MG/10ML IV SOSY
PREFILLED_SYRINGE | INTRAVENOUS | Status: AC
  Filled 2017-12-27: qty 20

## 2017-12-27 MED ORDER — HYDROCODONE-ACETAMINOPHEN 5-325 MG PO TABS
2.0000 | ORAL_TABLET | ORAL | Status: DC | PRN
  Administered 2017-12-27 (×2): 2 via ORAL
  Filled 2017-12-27: qty 2

## 2017-12-27 MED ORDER — CHLORHEXIDINE GLUCONATE CLOTH 2 % EX PADS: 6.0000 | MEDICATED_PAD | Freq: Once | CUTANEOUS | Status: DC

## 2017-12-27 MED ORDER — SENNA 8.6 MG PO TABS: 1.0000 | ORAL_TABLET | Freq: Two times a day (BID) | ORAL | Status: DC

## 2017-12-27 MED ORDER — FLUTICASONE PROPIONATE 50 MCG/ACT NA SUSP
2.0000 | Freq: Every day | NASAL | Status: DC
  Filled 2017-12-27: qty 16

## 2017-12-27 MED ORDER — METHOCARBAMOL 500 MG PO TABS
500.0000 mg | ORAL_TABLET | Freq: Four times a day (QID) | ORAL | Status: DC | PRN
  Administered 2017-12-27: 500 mg via ORAL

## 2017-12-27 MED ORDER — PANTOPRAZOLE SODIUM 40 MG PO TBEC: 40.0000 mg | DELAYED_RELEASE_TABLET | Freq: Every day | ORAL | Status: DC

## 2017-12-27 MED ORDER — HYDROMORPHONE HCL 2 MG/ML IJ SOLN
INTRAMUSCULAR | Status: AC
  Filled 2017-12-27: qty 1

## 2017-12-27 MED ORDER — THROMBIN (RECOMBINANT) 5000 UNITS EX SOLR
OROMUCOSAL | Status: DC | PRN
  Administered 2017-12-27: 14:00:00 via TOPICAL

## 2017-12-27 MED ORDER — LIDOCAINE-EPINEPHRINE 1 %-1:100000 IJ SOLN
INTRAMUSCULAR | Status: DC | PRN
  Administered 2017-12-27: 3 mL

## 2017-12-27 MED ORDER — NITROGLYCERIN 0.4 MG SL SUBL: 0.4000 mg | SUBLINGUAL_TABLET | SUBLINGUAL | Status: DC | PRN

## 2017-12-27 MED ORDER — ATORVASTATIN CALCIUM 20 MG PO TABS: 80.0000 mg | ORAL_TABLET | Freq: Every day | ORAL | Status: DC

## 2017-12-27 MED ORDER — HYDROCHLOROTHIAZIDE 25 MG PO TABS
25.0000 mg | ORAL_TABLET | Freq: Every day | ORAL | Status: DC
  Administered 2017-12-27: 25 mg via ORAL
  Filled 2017-12-27: qty 1

## 2017-12-27 MED ORDER — SODIUM CHLORIDE 0.9% FLUSH: 3.0000 mL | Freq: Two times a day (BID) | INTRAVENOUS | Status: DC

## 2017-12-27 MED ORDER — ROCURONIUM BROMIDE 100 MG/10ML IV SOLN
INTRAVENOUS | Status: DC | PRN
  Administered 2017-12-27: 50 mg via INTRAVENOUS

## 2017-12-27 MED ORDER — ONDANSETRON HCL 4 MG PO TABS: 4.0000 mg | ORAL_TABLET | Freq: Four times a day (QID) | ORAL | Status: DC | PRN

## 2017-12-27 MED ORDER — MIDAZOLAM HCL 2 MG/2ML IJ SOLN
INTRAMUSCULAR | Status: AC
  Filled 2017-12-27: qty 2

## 2017-12-27 MED ORDER — ONDANSETRON HCL 4 MG/2ML IJ SOLN: 4.0000 mg | Freq: Four times a day (QID) | INTRAMUSCULAR | Status: DC | PRN

## 2017-12-27 MED ORDER — BUPIVACAINE HCL (PF) 0.5 % IJ SOLN
INTRAMUSCULAR | Status: DC | PRN
  Administered 2017-12-27: 3 mL

## 2017-12-27 MED ORDER — FENTANYL CITRATE (PF) 250 MCG/5ML IJ SOLN
INTRAMUSCULAR | Status: AC
  Filled 2017-12-27: qty 5

## 2017-12-27 MED ORDER — METOPROLOL TARTRATE 12.5 MG HALF TABLET: 12.5000 mg | ORAL_TABLET | Freq: Two times a day (BID) | ORAL | Status: DC

## 2017-12-27 MED ORDER — LACTATED RINGERS IV SOLN
INTRAVENOUS | Status: DC | PRN
  Administered 2017-12-27 (×2): via INTRAVENOUS

## 2017-12-27 MED ORDER — OXYCODONE HCL 5 MG PO TABS: 5.0000 mg | ORAL_TABLET | Freq: Once | ORAL | Status: DC | PRN

## 2017-12-27 MED ORDER — PHENOL 1.4 % MT LIQD: 1.0000 | OROMUCOSAL | Status: DC | PRN

## 2017-12-27 MED ORDER — KETOROLAC TROMETHAMINE 15 MG/ML IJ SOLN
15.0000 mg | Freq: Four times a day (QID) | INTRAMUSCULAR | Status: DC
  Administered 2017-12-27: 15 mg via INTRAVENOUS
  Filled 2017-12-27: qty 1

## 2017-12-27 MED ORDER — MORPHINE SULFATE (PF) 4 MG/ML IV SOLN: 4.0000 mg | INTRAVENOUS | Status: DC | PRN

## 2017-12-27 MED ORDER — HYDROCODONE-ACETAMINOPHEN 5-325 MG PO TABS
ORAL_TABLET | ORAL | Status: AC
  Filled 2017-12-27: qty 2

## 2017-12-27 MED ORDER — OXYCODONE HCL 5 MG/5ML PO SOLN: 5.0000 mg | Freq: Once | ORAL | Status: DC | PRN

## 2017-12-27 MED ORDER — THROMBIN (RECOMBINANT) 5000 UNITS EX SOLR
CUTANEOUS | Status: AC
  Filled 2017-12-27: qty 5000

## 2017-12-27 MED ORDER — GLYCOPYRROLATE 0.2 MG/ML IJ SOLN
INTRAMUSCULAR | Status: DC | PRN
  Administered 2017-12-27: .2 mg via INTRAVENOUS

## 2017-12-27 MED ORDER — 0.9 % SODIUM CHLORIDE (POUR BTL) OPTIME
TOPICAL | Status: DC | PRN
  Administered 2017-12-27: 1000 mL

## 2017-12-27 MED ORDER — BUPIVACAINE HCL (PF) 0.5 % IJ SOLN
INTRAMUSCULAR | Status: AC
  Filled 2017-12-27: qty 30

## 2017-12-27 MED ORDER — SODIUM CHLORIDE 0.9 % IV SOLN
INTRAVENOUS | Status: DC | PRN
  Administered 2017-12-27: 14:00:00

## 2017-12-27 MED ORDER — BISACODYL 10 MG RE SUPP: 10.0000 mg | Freq: Every day | RECTAL | Status: DC | PRN

## 2017-12-27 MED ORDER — CEFAZOLIN SODIUM 1 G IJ SOLR
INTRAMUSCULAR | Status: AC
  Filled 2017-12-27: qty 20

## 2017-12-27 MED ORDER — DOCUSATE SODIUM 100 MG PO CAPS: 100.0000 mg | ORAL_CAPSULE | Freq: Two times a day (BID) | ORAL | Status: DC

## 2017-12-27 MED ORDER — HYDROCODONE-ACETAMINOPHEN 7.5-325 MG PO TABS: 1.0000 | ORAL_TABLET | Freq: Four times a day (QID) | ORAL | 0 refills | Status: DC | PRN

## 2017-12-27 MED ORDER — ONDANSETRON HCL 4 MG/2ML IJ SOLN
4.0000 mg | Freq: Once | INTRAMUSCULAR | Status: AC | PRN
  Administered 2017-12-27: 4 mg via INTRAVENOUS

## 2017-12-27 MED ORDER — LIDOCAINE-EPINEPHRINE 1 %-1:100000 IJ SOLN
INTRAMUSCULAR | Status: AC
  Filled 2017-12-27: qty 1

## 2017-12-27 MED ORDER — ALUM & MAG HYDROXIDE-SIMETH 200-200-20 MG/5ML PO SUSP: 30.0000 mL | Freq: Four times a day (QID) | ORAL | Status: DC | PRN

## 2017-12-27 MED ORDER — LEVOCETIRIZINE DIHYDROCHLORIDE 5 MG PO TABS: 5.0000 mg | ORAL_TABLET | Freq: Every evening | ORAL | Status: DC

## 2017-12-27 MED ORDER — PROPOFOL 10 MG/ML IV BOLUS
INTRAVENOUS | Status: AC
  Filled 2017-12-27: qty 20

## 2017-12-27 MED ORDER — ACETAMINOPHEN 650 MG RE SUPP: 650.0000 mg | RECTAL | Status: DC | PRN

## 2017-12-27 MED ORDER — ARTIFICIAL TEARS OPHTHALMIC OINT
TOPICAL_OINTMENT | OPHTHALMIC | Status: AC
  Filled 2017-12-27: qty 7

## 2017-12-27 MED ORDER — GABAPENTIN 300 MG PO CAPS
300.0000 mg | ORAL_CAPSULE | Freq: Four times a day (QID) | ORAL | Status: DC
  Administered 2017-12-27: 300 mg via ORAL
  Filled 2017-12-27: qty 1

## 2017-12-27 MED ORDER — POLYETHYLENE GLYCOL 3350 17 G PO PACK: 17.0000 g | PACK | Freq: Every day | ORAL | Status: DC | PRN

## 2017-12-27 MED ORDER — LORATADINE 10 MG PO TABS: 10.0000 mg | ORAL_TABLET | Freq: Every day | ORAL | Status: DC

## 2017-12-27 MED ORDER — ROCURONIUM BROMIDE 50 MG/5ML IV SOLN
INTRAVENOUS | Status: AC
  Filled 2017-12-27: qty 2

## 2017-12-27 MED ORDER — METHOCARBAMOL 1000 MG/10ML IJ SOLN
500.0000 mg | Freq: Four times a day (QID) | INTRAMUSCULAR | Status: DC | PRN
  Filled 2017-12-27: qty 5

## 2017-12-27 MED ORDER — ACETAMINOPHEN 325 MG PO TABS: 650.0000 mg | ORAL_TABLET | ORAL | Status: DC | PRN

## 2017-12-27 MED ORDER — ONDANSETRON HCL 4 MG/2ML IJ SOLN
INTRAMUSCULAR | Status: AC
  Filled 2017-12-27: qty 4

## 2017-12-27 MED ORDER — HYDROMORPHONE HCL 2 MG/ML IJ SOLN
0.3000 mg | INTRAMUSCULAR | Status: DC | PRN
  Administered 2017-12-27 (×4): 0.5 mg via INTRAVENOUS

## 2017-12-27 MED ORDER — FUROSEMIDE 20 MG PO TABS: 20.0000 mg | ORAL_TABLET | Freq: Every day | ORAL | Status: DC

## 2017-12-27 MED ORDER — EPHEDRINE SULFATE 50 MG/ML IJ SOLN
INTRAMUSCULAR | Status: AC
  Filled 2017-12-27: qty 2

## 2017-12-27 MED ORDER — DOCUSATE SODIUM 50 MG/5ML PO LIQD
100.0000 mg | Freq: Two times a day (BID) | ORAL | Status: DC
  Filled 2017-12-27: qty 10

## 2017-12-27 MED ORDER — ARTIFICIAL TEARS OPHTHALMIC OINT
TOPICAL_OINTMENT | OPHTHALMIC | Status: DC | PRN
  Administered 2017-12-27: 1 via OPHTHALMIC

## 2017-12-27 MED ORDER — VANCOMYCIN HCL IN DEXTROSE 1-5 GM/200ML-% IV SOLN
1000.0000 mg | INTRAVENOUS | Status: AC
  Administered 2017-12-27: 1000 mg via INTRAVENOUS

## 2017-12-27 MED ORDER — LEVOTHYROXINE SODIUM 100 MCG PO TABS: 100.0000 ug | ORAL_TABLET | Freq: Every day | ORAL | Status: DC

## 2017-12-27 MED ORDER — ONDANSETRON HCL 4 MG/2ML IJ SOLN
INTRAMUSCULAR | Status: DC | PRN
  Administered 2017-12-27: 4 mg via INTRAVENOUS

## 2017-12-27 MED ORDER — PROPOFOL 10 MG/ML IV BOLUS
INTRAVENOUS | Status: DC | PRN
  Administered 2017-12-27: 120 mg via INTRAVENOUS

## 2017-12-27 MED ORDER — HYDROCODONE-ACETAMINOPHEN 5-325 MG PO TABS: 1.0000 | ORAL_TABLET | Freq: Four times a day (QID) | ORAL | Status: DC | PRN

## 2017-12-27 MED ORDER — SUGAMMADEX SODIUM 500 MG/5ML IV SOLN
INTRAVENOUS | Status: DC | PRN
  Administered 2017-12-27: 400 mg via INTRAVENOUS

## 2017-12-27 MED ORDER — FENTANYL CITRATE (PF) 100 MCG/2ML IJ SOLN
INTRAMUSCULAR | Status: DC | PRN
  Administered 2017-12-27: 100 ug via INTRAVENOUS

## 2017-12-27 MED ORDER — AMLODIPINE BESYLATE 5 MG PO TABS: 5.0000 mg | ORAL_TABLET | Freq: Every day | ORAL | Status: DC

## 2017-12-27 MED ORDER — SODIUM CHLORIDE 0.9% FLUSH: 3.0000 mL | INTRAVENOUS | Status: DC | PRN

## 2017-12-27 SURGICAL SUPPLY — 53 items
ADH SKN CLS APL DERMABOND .7 (GAUZE/BANDAGES/DRESSINGS) ×1
APL SKNCLS STERI-STRIP NONHPOA (GAUZE/BANDAGES/DRESSINGS)
BAG DECANTER FOR FLEXI CONT (MISCELLANEOUS) ×2 IMPLANT
BENZOIN TINCTURE PRP APPL 2/3 (GAUZE/BANDAGES/DRESSINGS) IMPLANT
BLADE CLIPPER SURG (BLADE) IMPLANT
CANISTER SUCT 3000ML PPV (MISCELLANEOUS) ×2 IMPLANT
CARTRIDGE OIL MAESTRO DRILL (MISCELLANEOUS) IMPLANT
DECANTER SPIKE VIAL GLASS SM (MISCELLANEOUS) ×2 IMPLANT
DERMABOND ADVANCED (GAUZE/BANDAGES/DRESSINGS) ×1
DERMABOND ADVANCED .7 DNX12 (GAUZE/BANDAGES/DRESSINGS) IMPLANT
DIFFUSER DRILL AIR PNEUMATIC (MISCELLANEOUS) IMPLANT
DRAPE C-ARM 42X72 X-RAY (DRAPES) IMPLANT
DRAPE LAPAROTOMY 100X72 PEDS (DRAPES) IMPLANT
DRAPE LAPAROTOMY 100X72X124 (DRAPES) IMPLANT
DRAPE POUCH INSTRU U-SHP 10X18 (DRAPES) ×2 IMPLANT
DRSG OPSITE POSTOP 3X4 (GAUZE/BANDAGES/DRESSINGS) ×1 IMPLANT
DURAPREP 26ML APPLICATOR (WOUND CARE) ×2 IMPLANT
ELECT BLADE 4.0 EZ CLEAN MEGAD (MISCELLANEOUS) ×2
ELECT REM PT RETURN 9FT ADLT (ELECTROSURGICAL) ×2
ELECTRODE BLDE 4.0 EZ CLN MEGD (MISCELLANEOUS) ×1 IMPLANT
ELECTRODE REM PT RTRN 9FT ADLT (ELECTROSURGICAL) ×1 IMPLANT
GAUZE SPONGE 4X4 12PLY STRL (GAUZE/BANDAGES/DRESSINGS) ×2 IMPLANT
GAUZE SPONGE 4X4 16PLY XRAY LF (GAUZE/BANDAGES/DRESSINGS) IMPLANT
GLOVE BIOGEL PI IND STRL 8.5 (GLOVE) ×1 IMPLANT
GLOVE BIOGEL PI INDICATOR 8.5 (GLOVE) ×1
GLOVE ECLIPSE 8.5 STRL (GLOVE) ×2 IMPLANT
GOWN STRL REUS W/ TWL LRG LVL3 (GOWN DISPOSABLE) ×1 IMPLANT
GOWN STRL REUS W/ TWL XL LVL3 (GOWN DISPOSABLE) ×1 IMPLANT
GOWN STRL REUS W/TWL 2XL LVL3 (GOWN DISPOSABLE) ×2 IMPLANT
GOWN STRL REUS W/TWL LRG LVL3 (GOWN DISPOSABLE) ×2
GOWN STRL REUS W/TWL XL LVL3 (GOWN DISPOSABLE) ×2
KIT BASIN OR (CUSTOM PROCEDURE TRAY) ×2 IMPLANT
KIT TURNOVER KIT B (KITS) ×2 IMPLANT
MARKER SKIN DUAL TIP RULER LAB (MISCELLANEOUS) ×2 IMPLANT
NEEDLE HYPO 22GX1.5 SAFETY (NEEDLE) ×2 IMPLANT
NS IRRIG 1000ML POUR BTL (IV SOLUTION) ×2 IMPLANT
OIL CARTRIDGE MAESTRO DRILL (MISCELLANEOUS)
PACK LAMINECTOMY NEURO (CUSTOM PROCEDURE TRAY) ×2 IMPLANT
PAD ARMBOARD 7.5X6 YLW CONV (MISCELLANEOUS) ×2 IMPLANT
RASP 3.0MM (RASP) IMPLANT
SPONGE LAP 4X18 X RAY DECT (DISPOSABLE) IMPLANT
SPONGE SURGIFOAM ABS GEL SZ50 (HEMOSTASIS) ×2 IMPLANT
STAPLER SKIN PROX WIDE 3.9 (STAPLE) IMPLANT
STRIP CLOSURE SKIN 1/2X4 (GAUZE/BANDAGES/DRESSINGS) IMPLANT
SUT VIC AB 0 CT1 18XCR BRD8 (SUTURE) ×1 IMPLANT
SUT VIC AB 0 CT1 8-18 (SUTURE) ×2
SUT VIC AB 2-0 CP2 18 (SUTURE) ×2 IMPLANT
SUT VIC AB 3-0 SH 8-18 (SUTURE) ×2 IMPLANT
SWAB COLLECTION DEVICE MRSA (MISCELLANEOUS) IMPLANT
SWAB CULTURE ESWAB REG 1ML (MISCELLANEOUS) IMPLANT
TOWEL GREEN STERILE (TOWEL DISPOSABLE) ×2 IMPLANT
TOWEL GREEN STERILE FF (TOWEL DISPOSABLE) ×2 IMPLANT
WATER STERILE IRR 1000ML POUR (IV SOLUTION) ×2 IMPLANT

## 2017-12-27 NOTE — Transfer of Care (Signed)
Immediate Anesthesia Transfer of Care Note  Patient: Jeffrey Hooper  Procedure(s) Performed: Right Lumbar Two, Lumbar Three, Lumbar Four Pedicle screw removal with metrex (Right Spine Lumbar)  Patient Location: PACU  Anesthesia Type:General  Level of Consciousness: awake, alert  and oriented  Airway & Oxygen Therapy: Patient Spontanous Breathing and Patient connected to face mask oxygen  Post-op Assessment: Report given to RN, Post -op Vital signs reviewed and stable and Patient moving all extremities X 4  Post vital signs: Reviewed and stable  Last Vitals:  Vitals Value Taken Time  BP 145/78 12/27/2017  2:35 PM  Temp    Pulse 66 12/27/2017  2:36 PM  Resp 6 12/27/2017  2:36 PM  SpO2 100 % 12/27/2017  2:36 PM  Vitals shown include unvalidated device data.  Last Pain:  Vitals:   12/27/17 1040  TempSrc:   PainSc: 8       Patients Stated Pain Goal: 5 (18/59/09 3112)  Complications: No apparent anesthesia complications

## 2017-12-27 NOTE — Progress Notes (Signed)
Patient ID: Jeffrey Hooper, male   DOB: 1960-10-03, 57 y.o.   MRN: [id number] Vital signs are stable Motor function appears to be stable postoperatively Patient is resting comfortably at this time Can be discharged home today after he awakens adequately.

## 2017-12-27 NOTE — Anesthesia Procedure Notes (Signed)
Procedure Name: Intubation Date/Time: 12/27/2017 1:03 PM Performed by: Neldon Newport, CRNA Pre-anesthesia Checklist: Timeout performed, Patient being monitored, Suction available, Emergency Drugs available and Patient identified Patient Re-evaluated:Patient Re-evaluated prior to induction Oxygen Delivery Method: Circle system utilized Preoxygenation: Pre-oxygenation with 100% oxygen Induction Type: IV induction Ventilation: Mask ventilation without difficulty Laryngoscope Size: Glidescope and 3 Grade View: Grade II Tube type: Oral Tube size: 7.5 mm Number of attempts: 2 Placement Confirmation: breath sounds checked- equal and bilateral,  positive ETCO2 and ETT inserted through vocal cords under direct vision Secured at: 23 cm Tube secured with: Tape Dental Injury: Teeth and Oropharynx as per pre-operative assessment  Comments: First intubation cuff ruptured. Bougie used for tube exchange.

## 2017-12-27 NOTE — Progress Notes (Signed)
Discharged instructions/education/AVS/Rx given to patient with wife at bedside and they both verbalized understanding. No swelling, no drainage, no redness noted on incision site. MAE well, ambulating well with supervision. Voiding and Emptying his bladder with no problem. Discharge via wheelchair.

## 2017-12-27 NOTE — Anesthesia Preprocedure Evaluation (Addendum)
Anesthesia Evaluation  Patient identified by MRN, date of birth, ID band Patient awake    Reviewed: Allergy & Precautions, NPO status , Patient's Chart, lab work & pertinent test results  Airway Mallampati: II  TM Distance: >3 FB Neck ROM: full    Dental  (+) Teeth Intact, Dental Advidsory Given   Pulmonary former smoker,    breath sounds clear to auscultation       Cardiovascular hypertension, + CAD   Rhythm:Regular Rate:Normal     Neuro/Psych  Neuromuscular disease    GI/Hepatic GERD  Medicated and Controlled,  Endo/Other  Hypothyroidism   Renal/GU Renal disease     Musculoskeletal  (+) Arthritis ,   Abdominal   Peds  Hematology   Anesthesia Other Findings Left ventricle: The cavity size was normal. Systolic function was   normal. The estimated ejection fraction was in the range of 60%   to 65%. Wall motion was normal; there were no regional wall   motion abnormalities. - Aortic valve: There was trivial regurgitation. - Mitral valve: There was mild regurgitation. - Left atrium: The atrium was mildly dilated  Reproductive/Obstetrics                          Anesthesia Physical Anesthesia Plan  ASA: III  Anesthesia Plan: General   Post-op Pain Management:    Induction: Intravenous  PONV Risk Score and Plan: Ondansetron and Dexamethasone  Airway Management Planned: Oral ETT  Additional Equipment:   Intra-op Plan:   Post-operative Plan: Extubation in OR  Informed Consent: I have reviewed the patients History and Physical, chart, labs and discussed the procedure including the risks, benefits and alternatives for the proposed anesthesia with the patient or authorized representative who has indicated his/her understanding and acceptance.   Dental Advisory Given and Dental advisory given  Plan Discussed with: CRNA, Anesthesiologist and Surgeon  Anesthesia Plan Comments:         Anesthesia Quick Evaluation

## 2017-12-27 NOTE — Discharge Summary (Signed)
Physician Discharge Summary  Patient ID: CRISTIN SZATKOWSKI MRN: [id number] DOB/AGE: 1961-07-02 57 y.o.  Admit date: 12/27/2017 Discharge date: 12/27/2017  Admission Diagnoses: Lumbar radiculopathy status post decompression and fusion L2-L5  Discharge Diagnoses: Lumbar radiculopathy, status post decompression L2-L5 Active Problems:   Lumbar radiculopathy   Discharged Condition: stable  Hospital Course: Patient was admitted to undergo surgical removal of right-sided hardware from L2-L4.  Has a right lumbar radiculopathy.  Tolerated surgery well.  Consults: None  Significant Diagnostic Studies: None  Treatments: surgery: Removal of pedicle screws L2-L4 on the right  Discharge Exam: Blood pressure 140/85, pulse (!) 58, temperature 97.6 F (36.4 C), temperature source Oral, resp. rate 18, height 5\' 10"  (1.778 m), weight 95.7 kg (211 lb), SpO2 100 %. Tolerating mobilization well  Disposition: Discharge disposition: 01-Home or Self Care       Discharge Instructions    Call MD for:  redness, tenderness, or signs of infection (pain, swelling, redness, odor or green/yellow discharge around incision site)   Complete by:  As directed    Call MD for:  severe uncontrolled pain   Complete by:  As directed    Call MD for:  temperature >100.4   Complete by:  As directed    Diet - low sodium heart healthy   Complete by:  As directed    Discharge instructions   Complete by:  As directed    Okay to shower. Do not apply salves or appointments to incision. No heavy lifting with the upper extremities greater than 15 pounds. May resume driving when not requiring pain medication and patient feels comfortable with doing so.   Incentive spirometry RT   Complete by:  As directed    Increase activity slowly   Complete by:  As directed      Allergies as of 12/27/2017      Reactions   Alpha-gal Rash, Other (See Comments)   SEVERE ALLERGY TO ANY MEAT OR MEAT DERIVED PRODUCTS FROM 4 LEGGED  ANIMALS > > BEEF, PORK , GOATS, DEER, ETC. < < RESULT OF BITE FROM LONE STAR TICK   Beef Extract Rash, Other (See Comments)   Beef-derived Products Rash, Other (See Comments)   Lambs Quarters Rash, Other (See Comments)   Pork-derived Products Rash, Other (See Comments)   Prednisone Other (See Comments)   im depo medrol caused dizziness - pt passed out. Ask pt before giving (pt has taken since the reaction with no issues)   Brilinta [ticagrelor] Hives   Doxycycline Hives   Methylprednisolone Acetate    im depo medrol caused dizziness - pt passed out. Ask pt before giving    Penicillins Hives   Has patient had a PCN reaction causing immediate rash, facial/tongue/throat swelling, SOB or lightheadedness with hypotension: NO Pt states has has taken PCN after Vibramycin and not had any problems. Has patient had a PCN reaction causing severe rash involving mucus membranes or skin necrosis: No Has patient had a PCN reaction that required hospitalization No Has patient had a PCN reaction occurring within the last 10 years: No If all of the above answers are "NO", then may proceed with Cephalospo   Shellfish Allergy Hives   Adhesive [tape] Rash   Fentanyl Rash   Reaction to adhesive, not the drug       Medication List    TAKE these medications   acetaminophen 500 MG tablet Commonly known as:  TYLENOL Take 1,000 mg by mouth daily as needed for mild pain or headache.  amLODipine 5 MG tablet Commonly known as:  NORVASC Take 1 tablet (5 mg total) by mouth daily.   amoxicillin 500 MG tablet Commonly known as:  AMOXIL Take 2,000 mg by mouth See admin instructions. Take 2000 mg 1 hour prior to dental work   aspirin EC 81 MG tablet Take 81 mg by mouth at bedtime.   atorvastatin 80 MG tablet Commonly known as:  LIPITOR Take 1 tablet (80 mg total) by mouth at bedtime.   diphenhydrAMINE 25 MG tablet Commonly known as:  BENADRYL Take 50 mg by mouth 2 (two) times daily as needed for  itching.   fluticasone 50 MCG/ACT nasal spray Commonly known as:  FLONASE SPRAY 2 SPRAYS INTO EACH NOSTRIL EVERY DAY   furosemide 20 MG tablet Commonly known as:  LASIX TAKE 1 TABLET BY MOUTH EVERY DAY AS NEEDED   gabapentin 300 MG capsule Commonly known as:  NEURONTIN Take 300 mg by mouth 4 (four) times daily.   hydrochlorothiazide 25 MG tablet Commonly known as:  HYDRODIURIL TAKE 1 TABLET (25 MG TOTAL) BY MOUTH DAILY.   HYDROcodone-acetaminophen 5-325 MG tablet Commonly known as:  NORCO/VICODIN Take 1 tablet by mouth every 6 (six) hours as needed for severe pain ((score 7 to 10)). What changed:  Another medication with the same name was added. Make sure you understand how and when to take each.   HYDROcodone-acetaminophen 7.5-325 MG tablet Commonly known as:  NORCO Take 1-2 tablets by mouth every 6 (six) hours as needed for moderate pain or severe pain. What changed:  You were already taking a medication with the same name, and this prescription was added. Make sure you understand how and when to take each.   levocetirizine 5 MG tablet Commonly known as:  XYZAL Take 1 tablet (5 mg total) by mouth every evening.   levothyroxine 100 MCG tablet Commonly known as:  SYNTHROID, LEVOTHROID Take 1 tablet (100 mcg total) by mouth daily.   metoprolol tartrate 25 MG tablet Commonly known as:  LOPRESSOR TAKE 1/2 TABLET (12.5 MG TOTAL) BY MOUTH 2 (TWO) TIMES DAILY.   nitroGLYCERIN 0.4 MG SL tablet Commonly known as:  NITROSTAT Place 1 tablet (0.4 mg total) under the tongue every 5 (five) minutes as needed for chest pain.   pantoprazole 40 MG tablet Commonly known as:  PROTONIX Take 1 tablet (40 mg total) by mouth daily.   triamcinolone 0.025 % ointment Commonly known as:  KENALOG Apply 1 application topically 2 (two) times daily. What changed:    when to take this  reasons to take this        Signed: Earleen Newport 12/27/2017, 6:55 PM

## 2017-12-27 NOTE — Op Note (Signed)
Date of surgery: 12/27/2017 Preoperative diagnosis: Lumbar radiculopathy on the right, status post lumbar fusion L2-L5 October 2018 Postoperative diagnosis: Same Procedure: Removal of right-sided hardware at L2-L3-L4 with Metrix technique. Surgeon: Kristeen Miss, MD First assistant: None Anesthesia: General endotracheal Indications: Jeffrey Hooper is a 57 year old individuals had significant right lumbar radicular pain since a posterior lumbar interbody fusion at L2-L3-L4 and L5.  He had a dislocated screw at L5 on the right side and this had been removed back in January unless he is Persistent pain none review of the films he does have some medial displacement of the L3 and L4 screws also is advised after 6 months that all the hardware on the right side could be removed and this is now being performed to see if it helps with his lumbar radicular pain.  Procedure: Patient was brought to the operating room supine on stretcher.  After the smooth induction of general endotracheal anesthesia, he was turned prone.  The back was prepped with alcohol DuraPrep and draped in a sterile fashion.  Fluoroscopic guidance was used to localize the L2-3 and 4 screws on the right side.  A small vertical incision was created over this area.  A K wire was used to localize the top of the screw at L4.  Then using a series of dilators the entire screw head was uncovered and ultimately an 18 mm diameter 6 cm deep cannula was used to retract the soft tissues.  The screw was removed without difficulty then attention was turned to the neck screw and here the screw was noted to be severely tightened such that 2 screwdrivers had broken her caps when trying to remove the screw ultimately after using torque and counter torque the screw could be removed and the rod was then removed after removing the L2 screw.  The screws were then sequentially removed.  The wound was inspected both visually and radiographically and no remaining hardware was  noted on the right side the subcutaneous tissues were then closed with 2-0 Vicryl interrupted fashion 3-0 Vicryl was used in the subarticular skin Dermabond was placed on the skin.  Blood loss is estimated less than 20 cc.  Patient tolerated procedure well was returned to recovery room in stable condition.

## 2017-12-27 NOTE — H&P (Signed)
Jeffrey Hooper is an 57 y.o. male.   Chief Complaint: Right lumbar radiculopathy status post fusion L2-L5 October 2018 HPI: The patient is a 57 year old individual who is had significant chronic right lumbar radicular pain he has had severe spondylitic disease and multiple levels having had stenosis at L2 33445 he underwent surgical decompression in October 2018.  He has had complaints of severe persistent right lumbar radicular pain since that time a previous myelogram demonstrates that he had some medial displacement of the L5 screw.  This was removed in January.  However he has Persistent pain in that right lumbar radicular distribution affecting what appears to be the L3-L4-L5 distributions.  After careful consideration of the situation I advised removal of all the right-sided hardware as the screws in general appear medially displaced though none of them have significant cut out.  He is now admitted for that procedure.  Past Medical History:  Diagnosis Date  . Allergy   . Anxiety   . Arthritis   . CAD (coronary artery disease), native coronary artery 12/03/2015  . Coronary artery disease   . Dysrhythmia    "irregular and PAC'S"  . GERD (gastroesophageal reflux disease)   . Head injury with loss of consciousness ([place])    back in the 1980's  . History of kidney stones   . Hyperlipidemia   . Hypertension   . Hypothyroid   . Multiple benign nevi   . Neuromuscular disorder ([place]) 2011   LEFT RADIAL NERVE SURGERY R/T TRAUMA  . Pneumonia 2014, 2017  . Prostatism   . Pulmonary nodule     Past Surgical History:  Procedure Laterality Date  . BACK SURGERY     T12 - L1  . CARDIAC CATHETERIZATION N/A 12/02/2015   Procedure: Left Heart Cath and Coronary Angiography;  Surgeon: Peter M Martinique, MD;  Location: [place] [facility];  Service: Cardiovascular;  Laterality: N/A;  . CARDIAC CATHETERIZATION N/A 12/02/2015   Procedure: Coronary Stent Intervention;  Surgeon: Peter M Martinique, MD;  Location:  [place] [facility];  Service: Cardiovascular;  Laterality: N/A;  mid LAD Promus 2.5x12  . CORONARY ANGIOPLASTY     one stent placed by Dr. P Martinique  . HARDWARE REMOVAL Right 08/26/2017   Procedure: Right Lumbar Five Revision of pedicle screw with Removal of Lumbar Five Screw;  Surgeon: Kristeen Miss, MD;  Location: [place];  Service: Neurosurgery;  Laterality: Right;  posterior  . MASS EXCISION  06/25/2012   Procedure: EXCISION MASS;  Surgeon: Wynonia Sours, MD;  Location: [place];  Service: Orthopedics;  Laterality: Left;  transection of NEUROMA, BURYING RADIAL NERVE IN BRACHIORADIALIS LEFT SIDE  . radial nerve Left    cut in work injury  . SPINAL FUSION     C6-7  . TONSILLECTOMY  1982  . VASECTOMY      Family History  Problem Relation Age of Onset  . Heart failure Mother 74       Died age 2  . Congestive Heart Failure Mother   . Heart attack Father 23       Died with MI  . Heart attack Paternal Grandmother   . Heart attack Paternal Grandfather   . Colon cancer Neg Hx    Social History:  reports that he quit smoking about 11 years ago. His smoking use included cigarettes. He has a 20.00 pack-year smoking history. He has never used smokeless tobacco. He reports that he drinks alcohol. He reports that he does  not use drugs.  Allergies:  Allergies  Allergen Reactions  . Alpha-Gal Rash and Other (See Comments)    SEVERE ALLERGY TO ANY MEAT OR MEAT DERIVED PRODUCTS FROM 4 LEGGED ANIMALS > > BEEF, PORK , GOATS, DEER, ETC. < < RESULT OF BITE FROM LONE STAR TICK  . Beef Extract Rash and Other (See Comments)  . Beef-Derived Products Rash and Other (See Comments)  . Lambs Quarters Rash and Other (See Comments)  . Pork-Derived Products Rash and Other (See Comments)  . Prednisone Other (See Comments)    im depo medrol caused dizziness - pt passed out. Ask pt before giving (pt has taken since the reaction with no issues)  . Brilinta [Ticagrelor] Hives  . Doxycycline Hives   . Methylprednisolone Acetate     im depo medrol caused dizziness - pt passed out. Ask pt before giving   . Penicillins Hives    Has patient had a PCN reaction causing immediate rash, facial/tongue/throat swelling, SOB or lightheadedness with hypotension: NO Pt states has has taken PCN after Vibramycin and not had any problems. Has patient had a PCN reaction causing severe rash involving mucus membranes or skin necrosis: No Has patient had a PCN reaction that required hospitalization No Has patient had a PCN reaction occurring within the last 10 years: No If all of the above answers are "NO", then may proceed with Cephalospo  . Shellfish Allergy Hives  . Adhesive [Tape] Rash  . Fentanyl Rash    Reaction to adhesive, not the drug     No medications prior to admission.    No results found for this or any previous visit (from the past 48 hour(s)). No results found.  Review of Systems  Constitutional: Negative.   HENT: Negative.   Eyes: Negative.   Respiratory: Negative.   Cardiovascular: Negative.   Gastrointestinal: Negative.   Genitourinary: Negative.   Musculoskeletal: Positive for back pain.  Skin: Negative.   Neurological: Positive for tingling, sensory change and focal weakness.  Endo/Heme/Allergies: Negative.   Psychiatric/Behavioral: Negative.     There were no vitals taken for this visit. Physical Exam  Constitutional: He is oriented to person, place, and time. He appears well-developed and well-nourished.  HENT:  Head: Normocephalic and atraumatic.  Eyes: Pupils are equal, round, and reactive to light. Conjunctivae and EOM are normal.  Neck: Normal range of motion. Neck supple.  Cardiovascular: Normal rate and regular rhythm.  Respiratory: Effort normal and breath sounds normal.  GI: Soft. Bowel sounds are normal.  Neurological: He is alert and oriented to person, place, and time.  Cranial nerve examination is normal.  Upper extremity strength and reflexes are  normal.  Lower extremity there is modest weakness in the right tibialis anterior group and right gastroc.  Absent patellar and Achilles reflexes are noted bilaterally.  Straight leg raising is positive bilaterally.  Patrick's maneuver is negative at the extreme.  Skin: Skin is warm and dry.  Psychiatric: He has a normal mood and affect. His behavior is normal. Judgment and thought content normal.     Assessment/Plan Right lumbar radiculopathy status post decompression and fusion L2-L5.  Removal of right-sided hardware.  Earleen Newport, MD 12/27/2017, 6:35 AM

## 2017-12-27 NOTE — Anesthesia Postprocedure Evaluation (Signed)
Anesthesia Post Note  Patient: Jeffrey Hooper  Procedure(s) Performed: Right Lumbar Two, Lumbar Three, Lumbar Four Pedicle screw removal with metrex (Right Spine Lumbar)     Patient location during evaluation: PACU Anesthesia Type: General Level of consciousness: awake and alert Pain management: pain level controlled Vital Signs Assessment: post-procedure vital signs reviewed and stable Respiratory status: spontaneous breathing, nonlabored ventilation, respiratory function stable and patient connected to nasal cannula oxygen Cardiovascular status: blood pressure returned to baseline and stable Postop Assessment: no apparent nausea or vomiting Anesthetic complications: no    Last Vitals:  Vitals:   12/27/17 1535 12/27/17 1606  BP: 132/75 140/85  Pulse: 63 (!) 58  Resp: 18 18  Temp:  36.4 C  SpO2: 100%     Last Pain:  Vitals:   12/27/17 1900  TempSrc:   PainSc: 4                  Kinsie Belford COKER

## 2017-12-30 ENCOUNTER — Encounter (HOSPITAL_COMMUNITY): Payer: Self-pay | Admitting: Neurological Surgery

## 2017-12-31 ENCOUNTER — Other Ambulatory Visit: Payer: Self-pay | Admitting: Pediatrics

## 2017-12-31 DIAGNOSIS — I10 Essential (primary) hypertension: Secondary | ICD-10-CM

## 2018-01-05 ENCOUNTER — Other Ambulatory Visit: Payer: Self-pay | Admitting: Pediatrics

## 2018-01-05 DIAGNOSIS — Z9861 Coronary angioplasty status: Principal | ICD-10-CM

## 2018-01-05 DIAGNOSIS — I2510 Atherosclerotic heart disease of native coronary artery without angina pectoris: Secondary | ICD-10-CM

## 2018-01-15 DIAGNOSIS — M5417 Radiculopathy, lumbosacral region: Secondary | ICD-10-CM | POA: Diagnosis not present

## 2018-02-21 DIAGNOSIS — M4726 Other spondylosis with radiculopathy, lumbar region: Secondary | ICD-10-CM | POA: Diagnosis not present

## 2018-02-21 DIAGNOSIS — M5417 Radiculopathy, lumbosacral region: Secondary | ICD-10-CM | POA: Diagnosis not present

## 2018-02-26 ENCOUNTER — Encounter: Payer: Self-pay | Admitting: Pediatrics

## 2018-02-26 ENCOUNTER — Ambulatory Visit: Payer: BLUE CROSS/BLUE SHIELD | Admitting: Pediatrics

## 2018-02-26 VITALS — BP 157/79 | HR 44 | Temp 97.5°F | Ht 70.0 in | Wt 202.0 lb

## 2018-02-26 DIAGNOSIS — I10 Essential (primary) hypertension: Secondary | ICD-10-CM

## 2018-02-26 DIAGNOSIS — F419 Anxiety disorder, unspecified: Secondary | ICD-10-CM

## 2018-02-26 DIAGNOSIS — R001 Bradycardia, unspecified: Secondary | ICD-10-CM

## 2018-02-26 MED ORDER — BETAMETHASONE DIPROPIONATE 0.05 % EX CREA: TOPICAL_CREAM | Freq: Two times a day (BID) | CUTANEOUS | 0 refills | Status: DC

## 2018-02-26 MED ORDER — HYDROCHLOROTHIAZIDE 25 MG PO TABS: 25.0000 mg | ORAL_TABLET | Freq: Every day | ORAL | 0 refills | Status: DC

## 2018-02-26 MED ORDER — DULOXETINE HCL 30 MG PO CPEP: ORAL_CAPSULE | ORAL | 3 refills | Status: DC

## 2018-02-26 NOTE — Progress Notes (Signed)
  Subjective:   Patient ID: Jeffrey Hooper, male    DOB: [birth year], 57 y.o.   MRN: [id number] CC: Medical Management of Chronic Issues and Anxiety (GAD7-21)  HPI: Jeffrey Hooper is a 57 y.o. male   Past few weeks he has been feeling very tired and fatigued.  His mood is been down because he is in so much pain from his back.  He has follow-up with a specialist for that.  Sometimes feeling extra heartbeats or palpitations.  He has been taking his medicines regularly.  No headaches.  Sometimes he feels lightheaded when he stands up.   He has been feeling regularly very anxious. Not able to turn his thoughts off at night, keeping him awake some. Ear ringing still bothering him a lot.  Relevant past medical, surgical, family and social history reviewed. Allergies and medications reviewed and updated. Social History   Tobacco Use  Smoking Status Former Smoker  . Packs/day: 1.00  . Years: 20.00  . Pack years: 20.00  . Types: Cigarettes  . Last attempt to quit: 11/28/2006  . Years since quitting: 11.2  Smokeless Tobacco Never Used   ROS: Per HPI   Objective:    BP (!) 157/79   Pulse (!) 44   Temp (!) 97.5 F (36.4 C) (Oral)   Ht 5\' 10"  (1.778 m)   Wt 202 lb (91.6 kg)   BMI 28.98 kg/m   Wt Readings from Last 3 Encounters:  02/26/18 202 lb (91.6 kg)  12/27/17 211 lb (95.7 kg)  12/19/17 211 lb 1.6 oz (95.8 kg)    Gen: NAD, alert, cooperative with exam, NCAT EYES: EOMI, no conjunctival injection, or no icterus CV: bradycardic, normal S1/S2, no murmur, distal pulses 2+ b/l Resp: CTABL, no wheezes, normal WOB Abd: +BS, soft, NTND.  Ext: No edema, warm Neuro: Alert and oriented, strength equal b/l UE and LE, coordination grossly normal MSK: normal muscle bulk Psych: normal affect, mood down, no thoughts of self harm or not wanting to be here anymore  Assessment & Plan:  Jeffrey Hooper was seen today for medical management of chronic issues and anxiety.   Diagnoses and all orders for  this visit:  Bradycardia Heart rate in low 40s.  Concern he now may be symptomatic from the bradycardia.  He has been tolerant of it in the past though it has been in the lower 50s at that time.  Will hold metoprolol, takes 12.5mg  twice a dayy -     EKG 12-Lead  Essential hypertension Slightly elevated today.  Continue to check at home, let me know if remains elevated. -     hydrochlorothiazide (HYDRODIURIL) 25 MG tablet; Take 1 tablet (25 mg total) by mouth daily.  Anxiety Ongoing symptoms.  Patient agreeable to trying a medicine for it. -     DULoxetine (CYMBALTA) 30 MG capsule; Take one tab for 2 weeks, then take two tabs in the morning  Other orders -     betamethasone dipropionate (DIPROLENE) 0.05 % cream; Apply topically 2 (two) times daily.   Follow up plan: Return in about 1 month (around 03/26/2018). Assunta Found, MD [place]

## 2018-03-03 ENCOUNTER — Ambulatory Visit: Payer: BLUE CROSS/BLUE SHIELD | Admitting: *Deleted

## 2018-03-03 VITALS — BP 130/74 | HR 62

## 2018-03-03 DIAGNOSIS — Z0130 Encounter for examination of blood pressure without abnormal findings: Secondary | ICD-10-CM

## 2018-03-03 NOTE — Progress Notes (Signed)
Pt here for BP check BP  130 74 P 62

## 2018-03-20 ENCOUNTER — Other Ambulatory Visit: Payer: Self-pay | Admitting: Pediatrics

## 2018-03-20 DIAGNOSIS — F419 Anxiety disorder, unspecified: Secondary | ICD-10-CM

## 2018-04-10 ENCOUNTER — Other Ambulatory Visit: Payer: Self-pay | Admitting: Pediatrics

## 2018-04-10 DIAGNOSIS — R609 Edema, unspecified: Secondary | ICD-10-CM

## 2018-05-15 DIAGNOSIS — M5417 Radiculopathy, lumbosacral region: Secondary | ICD-10-CM | POA: Diagnosis not present

## 2018-05-18 ENCOUNTER — Other Ambulatory Visit: Payer: Self-pay | Admitting: Pediatrics

## 2018-05-20 ENCOUNTER — Encounter: Payer: Self-pay | Admitting: *Deleted

## 2018-05-30 ENCOUNTER — Ambulatory Visit: Payer: Self-pay | Admitting: Pediatrics

## 2018-06-02 DIAGNOSIS — M461 Sacroiliitis, not elsewhere classified: Secondary | ICD-10-CM | POA: Diagnosis not present

## 2018-06-04 ENCOUNTER — Encounter: Payer: Self-pay | Admitting: Pediatrics

## 2018-06-04 ENCOUNTER — Ambulatory Visit: Payer: BLUE CROSS/BLUE SHIELD | Admitting: Pediatrics

## 2018-06-04 VITALS — BP 134/81 | HR 61 | Temp 97.7°F | Ht 70.0 in | Wt 197.2 lb

## 2018-06-04 DIAGNOSIS — I2510 Atherosclerotic heart disease of native coronary artery without angina pectoris: Secondary | ICD-10-CM

## 2018-06-04 DIAGNOSIS — K219 Gastro-esophageal reflux disease without esophagitis: Secondary | ICD-10-CM | POA: Diagnosis not present

## 2018-06-04 DIAGNOSIS — E039 Hypothyroidism, unspecified: Secondary | ICD-10-CM

## 2018-06-04 DIAGNOSIS — E785 Hyperlipidemia, unspecified: Secondary | ICD-10-CM

## 2018-06-04 DIAGNOSIS — F419 Anxiety disorder, unspecified: Secondary | ICD-10-CM | POA: Diagnosis not present

## 2018-06-04 DIAGNOSIS — Z9861 Coronary angioplasty status: Secondary | ICD-10-CM

## 2018-06-04 DIAGNOSIS — T7800XD Anaphylactic reaction due to unspecified food, subsequent encounter: Secondary | ICD-10-CM

## 2018-06-04 DIAGNOSIS — I10 Essential (primary) hypertension: Secondary | ICD-10-CM | POA: Diagnosis not present

## 2018-06-04 MED ORDER — NITROGLYCERIN 0.4 MG SL SUBL: 0.4000 mg | SUBLINGUAL_TABLET | SUBLINGUAL | 2 refills | Status: DC | PRN

## 2018-06-04 MED ORDER — ATORVASTATIN CALCIUM 80 MG PO TABS: 80.0000 mg | ORAL_TABLET | Freq: Every day | ORAL | 2 refills | Status: DC

## 2018-06-04 MED ORDER — AMLODIPINE BESYLATE 5 MG PO TABS: 5.0000 mg | ORAL_TABLET | Freq: Every day | ORAL | 2 refills | Status: DC

## 2018-06-04 MED ORDER — PANTOPRAZOLE SODIUM 40 MG PO TBEC: 40.0000 mg | DELAYED_RELEASE_TABLET | Freq: Every day | ORAL | 3 refills | Status: DC

## 2018-06-04 MED ORDER — LEVOTHYROXINE SODIUM 100 MCG PO TABS: 100.0000 ug | ORAL_TABLET | Freq: Every day | ORAL | 2 refills | Status: DC

## 2018-06-04 MED ORDER — EPINEPHRINE 0.15 MG/0.3ML IJ SOAJ: 0.1500 mg | INTRAMUSCULAR | 1 refills | Status: DC | PRN

## 2018-06-04 MED ORDER — HYDROCHLOROTHIAZIDE 25 MG PO TABS: 25.0000 mg | ORAL_TABLET | Freq: Every day | ORAL | 1 refills | Status: DC

## 2018-06-04 MED ORDER — DULOXETINE HCL 60 MG PO CPEP: 60.0000 mg | ORAL_CAPSULE | Freq: Two times a day (BID) | ORAL | 1 refills | Status: DC

## 2018-06-04 NOTE — Progress Notes (Signed)
Subjective:   Patient ID: DERRIUS FURTICK, male    DOB: June 16, 1961, 57 y.o.   MRN: [id number] CC: Medical Management of Chronic Issues  HPI: Jeffrey Hooper is a 57 y.o. male   Hypertension: Taking medicine regularly at home.  No recent chest pain, shortness of breath.  Exercise limited by leg and back pain.  Feeling better, back to his normal self and less tired and fatigued since beta-blocker decreased due to bradycardia, was taking metoprolol 12.5 mg twice a day.  Now not on beta-blocker.  Hypothyroidism: Tolerating medicine.  GERD: Taking medicine regularly  History of alpha gal allergy: No recent exposures.  Avoiding meats.  Last seen by allergy over a year ago.    Hyperlipidemia: Tolerating statin, no side effects  Anxiety: Taking Cymbalta 30 mg daily.  Does still have some symptoms.  Bothered regularly still by the ringing in his ears  CAD: Has not recently needed to use nitroglycerin.  Current medicine not a day.  Relevant past medical, surgical, family and social history reviewed. Allergies and medications reviewed and updated. Social History   Tobacco Use  Smoking Status Former Smoker  . Packs/day: 1.00  . Years: 20.00  . Pack years: 20.00  . Types: Cigarettes  . Last attempt to quit: 11/28/2006  . Years since quitting: 11.5  Smokeless Tobacco Never Used   ROS: Per HPI   Objective:    BP 134/81   Pulse 61   Temp 97.7 F (36.5 C) (Oral)   Ht 5\' 10"  (1.778 m)   Wt 197 lb 3.2 oz (89.4 kg)   BMI 28.30 kg/m   Wt Readings from Last 3 Encounters:  06/04/18 197 lb 3.2 oz (89.4 kg)  02/26/18 202 lb (91.6 kg)  12/27/17 211 lb (95.7 kg)    Gen: NAD, alert, cooperative with exam, NCAT EYES: EOMI, no conjunctival injection, or no icterus ENT: OP without erythema LYMPH: no cervical LAD CV: NRRR, normal S1/S2, no murmur, distal pulses 2+ b/l Resp: CTABL, no wheezes, normal WOB Abd: +BS, soft, NTND. Ext: No edema, warm Neuro: Alert and oriented MSK: normal muscle  bulk  Assessment & Plan:  Concepcion was seen today for medical management of chronic issues.  Diagnoses and all orders for this visit:  Essential hypertension Slightly elevated, continue below.  Fatigue lightheadedness has improved -     hydrochlorothiazide (HYDRODIURIL) 25 MG tablet; Take 1 tablet (25 mg total) by mouth daily. -     amLODipine (NORVASC) 5 MG tablet; Take 1 tablet (5 mg total) by mouth daily. -     Basic Metabolic Panel  Anxiety Some ongoing symptoms.  Will increase to 60 mg a below.  Discussed possible options for future including CBT for tinnitus. -     DULoxetine (CYMBALTA) 60 MG capsule; Take 1 capsule (60 mg total) by mouth daily.  Gastroesophageal reflux disease, esophagitis presence not specified Stable, continue below -     pantoprazole (PROTONIX) 40 MG tablet; Take 1 tablet (40 mg total) by mouth daily.  Hyperlipidemia, unspecified hyperlipidemia type Stable, continue below -     atorvastatin (LIPITOR) 80 MG tablet; Take 1 tablet (80 mg total) by mouth at bedtime.  Hypothyroidism, unspecified type Stable, continue below -     levothyroxine (SYNTHROID, LEVOTHROID) 100 MCG tablet; Take 1 tablet (100 mcg total) by mouth daily.  CAD S/P percutaneous coronary angioplasty Stable, no recent symptoms.  Will refill nitroglycerin, current prescription is out of date.  Taking aspirin, atorvastatin.  Was on low-dose  beta-blocker, metoprolol 12.5 mg twice daily was having heart rate persistently in the low 40s, symptomatic.  Symptoms have improved since this was stopped. -     nitroGLYCERIN (NITROSTAT) 0.4 MG SL tablet; Place 1 tablet (0.4 mg total) under the tongue every 5 (five) minutes as needed for chest pain.  Anaphylactic shock due to food, subsequent encounter Due for follow-up with allergy, patient aware -     EPINEPHrine (EPIPEN 2-PAK) 0.3 mg/0.3 mL IJ SOAJ injection; Inject 0.3 mLs (0.3 mg total) into the muscle once for 1 dose.   Follow up plan: Return in  about 6 months (around 12/04/2018). Assunta Found, MD [place]

## 2018-06-05 MED ORDER — EPINEPHRINE 0.3 MG/0.3ML IJ SOAJ: 0.3000 mg | Freq: Once | INTRAMUSCULAR | 1 refills | Status: AC

## 2018-06-05 MED ORDER — DULOXETINE HCL 60 MG PO CPEP: 60.0000 mg | ORAL_CAPSULE | Freq: Every day | ORAL | 1 refills | Status: DC

## 2018-07-06 ENCOUNTER — Other Ambulatory Visit: Payer: Self-pay | Admitting: Pediatrics

## 2018-07-06 DIAGNOSIS — R609 Edema, unspecified: Secondary | ICD-10-CM

## 2018-07-15 DIAGNOSIS — M5417 Radiculopathy, lumbosacral region: Secondary | ICD-10-CM | POA: Diagnosis not present

## 2018-07-28 DIAGNOSIS — M5417 Radiculopathy, lumbosacral region: Secondary | ICD-10-CM | POA: Diagnosis not present

## 2018-07-28 DIAGNOSIS — R200 Anesthesia of skin: Secondary | ICD-10-CM | POA: Insufficient documentation

## 2018-07-28 DIAGNOSIS — R29898 Other symptoms and signs involving the musculoskeletal system: Secondary | ICD-10-CM | POA: Diagnosis not present

## 2018-08-04 ENCOUNTER — Telehealth: Payer: Self-pay | Admitting: Pediatrics

## 2018-08-04 NOTE — Telephone Encounter (Signed)
Pt wanted to verify dose of cymbalta. Current rx of Cymbalta 60mg  take 1 daily verified. Pt states [facility] has the instructions as 60mg  take 1 BID. Called [facility] and cancelled the 60 mg twice daily.

## 2018-08-14 DIAGNOSIS — G8928 Other chronic postprocedural pain: Secondary | ICD-10-CM | POA: Insufficient documentation

## 2018-08-14 DIAGNOSIS — Z981 Arthrodesis status: Secondary | ICD-10-CM | POA: Diagnosis not present

## 2018-08-21 DIAGNOSIS — M5417 Radiculopathy, lumbosacral region: Secondary | ICD-10-CM | POA: Diagnosis not present

## 2018-08-21 DIAGNOSIS — I10 Essential (primary) hypertension: Secondary | ICD-10-CM | POA: Diagnosis not present

## 2018-08-21 DIAGNOSIS — R200 Anesthesia of skin: Secondary | ICD-10-CM | POA: Diagnosis not present

## 2018-08-21 DIAGNOSIS — R29898 Other symptoms and signs involving the musculoskeletal system: Secondary | ICD-10-CM | POA: Diagnosis not present

## 2018-09-15 ENCOUNTER — Other Ambulatory Visit: Payer: Self-pay | Admitting: Pediatrics

## 2018-09-30 ENCOUNTER — Other Ambulatory Visit: Payer: Self-pay | Admitting: Pediatrics

## 2018-09-30 DIAGNOSIS — R609 Edema, unspecified: Secondary | ICD-10-CM

## 2018-10-06 ENCOUNTER — Ambulatory Visit: Payer: BLUE CROSS/BLUE SHIELD | Admitting: Nurse Practitioner

## 2018-10-06 ENCOUNTER — Encounter: Payer: Self-pay | Admitting: Nurse Practitioner

## 2018-10-06 VITALS — BP 139/78 | HR 83 | Temp 98.3°F | Ht 70.0 in | Wt 212.0 lb

## 2018-10-06 DIAGNOSIS — R52 Pain, unspecified: Secondary | ICD-10-CM | POA: Diagnosis not present

## 2018-10-06 DIAGNOSIS — J069 Acute upper respiratory infection, unspecified: Secondary | ICD-10-CM | POA: Diagnosis not present

## 2018-10-06 LAB — VERITOR FLU A/B WAIVED
INFLUENZA B: NEGATIVE
Influenza A: NEGATIVE

## 2018-10-06 MED ORDER — AZITHROMYCIN 250 MG PO TABS: ORAL_TABLET | ORAL | 0 refills | Status: DC

## 2018-10-06 MED ORDER — HYDROCODONE-HOMATROPINE 5-1.5 MG/5ML PO SYRP: 5.0000 mL | ORAL_SOLUTION | Freq: Four times a day (QID) | ORAL | 0 refills | Status: DC | PRN

## 2018-10-06 NOTE — Patient Instructions (Signed)

## 2018-10-06 NOTE — Progress Notes (Signed)
Subjective:    Patient ID: Jeffrey Hooper, male    DOB: 07/27/1961, 58 y.o.   MRN: [id number]   Chief Complaint: Cough (Started yesterday); Fever; and Generalized Body Aches   HPI Patient comes in c/o fever and chills that started yesterday. Has slight cough. Has developed pneumonia in th epast 2 times after having flu.   Review of Systems  Constitutional: Positive for chills and fever (101.6).  HENT: Positive for congestion and rhinorrhea. Negative for ear pain, sore throat and trouble swallowing.   Respiratory: Positive for cough. Negative for shortness of breath.   Cardiovascular: Negative.   Musculoskeletal: Positive for myalgias.  Neurological: Positive for headaches.  Psychiatric/Behavioral: Negative.   All other systems reviewed and are negative.      Objective:   Physical Exam Vitals signs and nursing note reviewed.  Constitutional:      General: He is in acute distress (mld).     Appearance: Normal appearance. He is normal weight.  HENT:     Right Ear: Hearing, tympanic membrane, ear canal and external ear normal.     Left Ear: Hearing, tympanic membrane, ear canal and external ear normal.     Nose: Mucosal edema, congestion and rhinorrhea present.     Right Sinus: No maxillary sinus tenderness or frontal sinus tenderness.     Left Sinus: No maxillary sinus tenderness or frontal sinus tenderness.     Mouth/Throat:     Lips: Pink.     Mouth: Mucous membranes are moist.     Pharynx: Oropharynx is clear.     Tonsils: Swelling: 0 on the right. 0 on the left.  Neck:     Musculoskeletal: Normal range of motion and neck supple.  Cardiovascular:     Rate and Rhythm: Normal rate and regular rhythm.     Heart sounds: Normal heart sounds.  Pulmonary:     Effort: Pulmonary effort is normal.     Breath sounds: Normal breath sounds.  Skin:    General: Skin is warm.  Neurological:     General: No focal deficit present.     Mental Status: He is alert and oriented to  person, place, and time.  Psychiatric:        Mood and Affect: Mood normal.        Behavior: Behavior normal.    BP 139/78   Pulse 83   Temp 98.3 F (36.8 C) (Oral)   Ht 5\' 10"  (1.778 m)   Wt 212 lb (96.2 kg)   BMI 30.42 kg/m   Flu negative    Assessment & Plan:  Jeffrey Hooper in today with chief complaint of Cough (Started yesterday); Fever; and Generalized Body Aches   1. Body aches - Veritor Flu A/B Waived  2. Upper respiratory infection with cough and congestion 1. Take meds as prescribed 2. Use a cool mist humidifier especially during the winter months and when heat has been humid. 3. Use saline nose sprays frequently 4. Saline irrigations of the nose can be very helpful if done frequently.  * 4X daily for 1 week*  * Use of a nettie pot can be helpful with this. Follow directions with this* 5. Drink plenty of fluids 6. Keep thermostat turn down low 7.For any cough or congestion  Use plain Mucinex- regular strength or max strength is fine   * Children- consult with Pharmacist for dosing 8. For fever or aces or pains- take tylenol or ibuprofen appropriate for age and weight.  *  for fevers greater than 101 orally you may alternate ibuprofen and tylenol every  3 hours.    - azithromycin (ZITHROMAX Z-PAK) 250 MG tablet; As directed  Dispense: 6 tablet; Refill: 0 - HYDROcodone-homatropine (HYCODAN) 5-1.5 MG/5ML syrup; Take 5 mLs by mouth every 6 (six) hours as needed for cough.  Dispense: 120 mL; Refill: 0  Mary-Margaret Hassell Done, FNP

## 2018-10-22 DIAGNOSIS — M961 Postlaminectomy syndrome, not elsewhere classified: Secondary | ICD-10-CM | POA: Diagnosis not present

## 2018-10-22 DIAGNOSIS — I10 Essential (primary) hypertension: Secondary | ICD-10-CM | POA: Diagnosis not present

## 2018-10-22 DIAGNOSIS — Z6831 Body mass index (BMI) 31.0-31.9, adult: Secondary | ICD-10-CM | POA: Diagnosis not present

## 2018-11-14 ENCOUNTER — Telehealth: Payer: Self-pay | Admitting: Cardiology

## 2018-11-14 NOTE — Telephone Encounter (Signed)
Smart phone/mychart/pre reg complete/dc/04.10.2020

## 2018-11-18 ENCOUNTER — Telehealth: Payer: Self-pay

## 2018-11-18 NOTE — Telephone Encounter (Signed)
LEFT MESSAGE FOR PT TO RETURN CALL TO SCHEDULE VIRTUAL APPT

## 2018-11-18 NOTE — Telephone Encounter (Signed)
Follow up  ° ° °Patient is returning call.  °

## 2018-11-18 NOTE — Telephone Encounter (Signed)
Virtual Visit Pre-Appointment Phone Call  Steps For Call: [place] PHONE   1. Confirm consent - "In the setting of the current Covid19 crisis, you are scheduled for a (phone or video) visit with your provider on (date) at (time).  Just as we do with many in-office visits, in order for you to participate in this visit, we must obtain consent.  If you'd like, I can send this to your mychart (if signed up) or email for you to review.  Otherwise, I can obtain your verbal consent now.  All virtual visits are billed to your insurance company just like a normal visit would be.  By agreeing to a virtual visit, we'd like you to understand that the technology does not allow for your provider to perform an examination, and thus may limit your provider's ability to fully assess your condition.  Finally, though the technology is pretty good, we cannot assure that it will always work on either your or our end, and in the setting of a video visit, we may have to convert it to a phone-only visit.  In either situation, we cannot ensure that we have a secure connection.  Are you willing to proceed?" STAFF: Did the patient verbally acknowledge consent to treatment? Document YES/NO  2. Give patient instructions for WebEx/MyChart download to smartphone or [email or web address] if video visit (depending on what platform provider is using)  3. Advise patient to be prepared with any vital sign or heart rhythm information, their current medicines, and a piece of paper and pen handy for any instructions they may receive the day of their visit  4. Inform patient they will receive a phone call 15 minutes prior to their appointment time (may be from unknown caller ID) so they should be prepared to answer  5. Confirm that appointment type is correct in Epic appointment notes (video vs telephone)    TELEPHONE CALL NOTE  Jeffrey Hooper has been deemed a candidate for a follow-up tele-health visit to limit community  exposure during the Covid-19 pandemic. I spoke with the patient via phone to ensure availability of phone/video source, confirm preferred email & phone number, and discuss instructions and expectations.  I reminded [name] to be prepared with any vital sign and/or heart rhythm information that could potentially be obtained via home monitoring, at the time of his visit. I reminded [name] to expect a phone call at the time of his visit if his visit.  Zebedee Iba, CMA 11/18/2018 11:22 AM   DOWNLOADING THE WEBEX SOFTWARE TO SMARTPHONE  - If Apple, go to [employer] and type in WebEx in the search bar. [place] [employer], the blue/Elonda Giuliano circle. The app is free but as with any other app downloads, their phone may require them to verify saved payment information or Apple password. The patient does NOT have to create an account.  - If Android, ask patient to go to [employer] and type in WebEx in the search bar. [place] [employer], the blue/Christa Fasig circle. The app is free but as with any other app downloads, their phone may require them to verify saved payment information or Android password. The patient does NOT have to create an account.   CONSENT FOR TELE-HEALTH VISIT - PLEASE REVIEW  I hereby voluntarily request, consent and authorize [facility] and its employed or contracted physicians, physician assistants, nurse practitioners or other licensed health care professionals (the Practitioner), to provide me with  telemedicine health care services (the "Services") as deemed necessary by the treating Practitioner. I acknowledge and consent to receive the Services by the Practitioner via telemedicine. I understand that the telemedicine visit will involve communicating with the Practitioner through live audiovisual communication technology and the disclosure of certain medical information by electronic transmission. I acknowledge that I have been given the  opportunity to request an in-person assessment or other available alternative prior to the telemedicine visit and am voluntarily participating in the telemedicine visit.  I understand that I have the right to withhold or withdraw my consent to the use of telemedicine in the course of my care at any time, without affecting my right to future care or treatment, and that the Practitioner or I may terminate the telemedicine visit at any time. I understand that I have the right to inspect all information obtained and/or recorded in the course of the telemedicine visit and may receive copies of available information for a reasonable fee.  I understand that some of the potential risks of receiving the Services via telemedicine include:  [name] Delay or interruption in medical evaluation due to technological equipment failure or disruption; . Information transmitted may not be sufficient (e.g. poor resolution of images) to allow for appropriate medical decision making by the Practitioner; and/or  . In rare instances, security protocols could fail, causing a breach of personal health information.  Furthermore, I acknowledge that it is my responsibility to provide information about my medical history, conditions and care that is complete and accurate to the best of my ability. I acknowledge that Practitioner's advice, recommendations, and/or decision may be based on factors not within their control, such as incomplete or inaccurate data provided by me or distortions of diagnostic images or specimens that may result from electronic transmissions. I understand that the practice of medicine is not an exact science and that Practitioner makes no warranties or guarantees regarding treatment outcomes. I acknowledge that I will receive a copy of this consent concurrently upon execution via email to the email address I last provided but may also request a printed copy by calling the office of [facility].    I understand that  my insurance will be billed for this visit.   I have read or had this consent read to me. . I understand the contents of this consent, which adequately explains the benefits and risks of the Services being provided via telemedicine.  . I have been provided ample opportunity to ask questions regarding this consent and the Services and have had my questions answered to my satisfaction. . I give my informed consent for the services to be provided through the use of telemedicine in my medical care  By participating in this telemedicine visit I agree to the above.

## 2018-11-20 NOTE — Progress Notes (Signed)
Virtual Visit via Video Note   This visit type was conducted due to national recommendations for restrictions regarding the COVID-19 Pandemic (e.g. social distancing) in an effort to limit this patient's exposure and mitigate transmission in our community.  Due to his co-morbid illnesses, this patient is at least at moderate risk for complications without adequate follow up.  This format is felt to be most appropriate for this patient at this time.  All issues noted in this document were discussed and addressed.  A limited physical exam was performed with this format.  Please refer to the patient's chart for his consent to telehealth for [facility].   Evaluation Performed:  Follow-up visit  Date:  11/21/2018   ID:  Jeffrey Hooper, DOB [birth year], MRN [id number]  Patient Location: Home Provider Location: Home  PCP:  Baruch Gouty, FNP  Cardiologist:  Hooper Breeding, MD  Electrophysiologist:  None   Chief Complaint:  CAD  History of Present Illness:    Jeffrey Hooper is a 58 y.o. male who presents follow up of CAD.  Stress perfusion study in 2017 suggested a moderate sized defect in the inferior wall with ischemia. The EF was well-preserved.  The patient presented to [facility] on 12/02/15 for cath and was found to have single vessel obstructive CAD involving the mid LAD. He underwent successful PCI with a mid LAD DES.   Since I last saw him he has done well.  The patient denies any new symptoms such as chest discomfort, neck or arm discomfort. There has been no new shortness of breath, PND or orthopnea. There has been  presyncope or syncope.  He still feels palpitations.  However, these are fleeting.  They last for only a few minutes at a time.  The patient does not have symptoms concerning for COVID-19 infection (fever, chills, cough, or new shortness of breath).    Past Medical History:  Diagnosis Date  . Allergy   . Anxiety   . Arthritis   . CAD (coronary artery disease), native  coronary artery 12/03/2015  . Coronary artery disease   . Dysrhythmia    "irregular and PAC'S"  . GERD (gastroesophageal reflux disease)   . Head injury with loss of consciousness ([place])    back in the 1980's  . History of kidney stones   . Hyperlipidemia   . Hypertension   . Hypothyroid   . Multiple benign nevi   . Neuromuscular disorder ([place]) 2011   LEFT RADIAL NERVE SURGERY R/T TRAUMA  . Pneumonia 2014, 2017  . Prostatism   . Pulmonary nodule    Past Surgical History:  Procedure Laterality Date  . BACK SURGERY     T12 - L1  . CARDIAC CATHETERIZATION N/A 12/02/2015   Procedure: Left Heart Cath and Coronary Angiography;  Surgeon: Peter M Martinique, MD;  Location: [place] [facility];  Service: Cardiovascular;  Laterality: N/A;  . CARDIAC CATHETERIZATION N/A 12/02/2015   Procedure: Coronary Stent Intervention;  Surgeon: Peter M Martinique, MD;  Location: [place] [facility];  Service: Cardiovascular;  Laterality: N/A;  mid LAD Promus 2.5x12  . CORONARY ANGIOPLASTY     one stent placed by Dr. P Martinique  . HARDWARE REMOVAL Right 08/26/2017   Procedure: Right Lumbar Five Revision of pedicle screw with Removal of Lumbar Five Screw;  Surgeon: Kristeen Miss, MD;  Location: [place];  Service: Neurosurgery;  Laterality: Right;  posterior  . HARDWARE REMOVAL Right 12/27/2017   Procedure: Right Lumbar Two, Lumbar  Three, Lumbar Four Pedicle screw removal with metrex;  Surgeon: Kristeen Miss, MD;  Location: [place];  Service: Neurosurgery;  Laterality: Right;  Right L2 to L4 Pedicle screw removal with mterex  . MASS EXCISION  06/25/2012   Procedure: EXCISION MASS;  Surgeon: Wynonia Sours, MD;  Location: [place];  Service: Orthopedics;  Laterality: Left;  transection of NEUROMA, BURYING RADIAL NERVE IN BRACHIORADIALIS LEFT SIDE  . radial nerve Left    cut in work injury  . SPINAL FUSION     C6-7  . TONSILLECTOMY  1982  . VASECTOMY       Prior to Admission medications   Medication Sig Start  Date End Date Taking? Authorizing Provider  acetaminophen (TYLENOL) 500 MG tablet Take 1,000 mg by mouth daily as needed for mild pain or headache.    Yes [provider]  amLODipine (NORVASC) 5 MG tablet Take 1 tablet (5 mg total) by mouth daily. 06/04/18  Yes [name], MD  aspirin EC 81 MG tablet Take 81 mg by mouth at bedtime.   Yes [provider]  atorvastatin (LIPITOR) 80 MG tablet Take 1 tablet (80 mg total) by mouth at bedtime. 06/04/18  Yes [name], MD  betamethasone dipropionate (DIPROLENE) 0.05 % cream APPLY TO AFFECTED AREA TWICE A DAY 09/16/18  Yes Rakes, Connye Burkitt, FNP  diphenhydrAMINE (BENADRYL) 25 MG tablet Take 50 mg by mouth 2 (two) times daily as needed for itching.    Yes [provider]  EPINEPHrine 0.3 mg/0.3 mL IJ SOAJ injection Inject 0.3 mg into the muscle as needed for anaphylaxis.   Yes [provider]  fluticasone (FLONASE) 50 MCG/ACT nasal spray SPRAY 2 SPRAYS INTO EACH NOSTRIL EVERY DAY 05/19/18  Yes [name], MD  hydrochlorothiazide (HYDRODIURIL) 25 MG tablet Take 1 tablet (25 mg total) by mouth daily. 06/04/18  Yes [name], MD  HYDROcodone-acetaminophen (NORCO/VICODIN) 5-325 MG tablet Take 1 tablet by mouth every 6 (six) hours as needed. for pain 09/24/18  Yes [provider]  levocetirizine (XYZAL) 5 MG tablet Take 1 tablet (5 mg total) by mouth every evening. 11/27/17  Yes [name], MD  levothyroxine (SYNTHROID, LEVOTHROID) 100 MCG tablet Take 1 tablet (100 mcg total) by mouth daily. 06/04/18  Yes [name], MD  nitroGLYCERIN (NITROSTAT) 0.4 MG SL tablet Place 1 tablet (0.4 mg total) under the tongue every 5 (five) minutes as needed for chest pain. 06/04/18  Yes [name], MD  pantoprazole (PROTONIX) 40 MG tablet Take 1 tablet (40 mg total) by mouth daily. 06/04/18  Yes [name], MD  triamcinolone (KENALOG) 0.025 % ointment Apply 1 application topically 2 (two)  times daily. Patient taking differently: Apply 1 application topically 2 (two) times daily as needed (rash).  11/27/17  Yes [name], MD    Allergies:   Alpha-gal; Beef extract; Beef-derived products; Lambs quarters; Pork-derived products; Prednisone; Brilinta [ticagrelor]; Doxycycline; Methylprednisolone acetate; Penicillins; Shellfish allergy; Adhesive [tape]; and Fentanyl   Social History   Tobacco Use  . Smoking status: Former Smoker    Packs/day: 1.00    Years: 20.00    Pack years: 20.00    Types: Cigarettes    Last attempt to quit: 11/28/2006    Years since quitting: 11.9  . Smokeless tobacco: Never Used  Substance Use Topics  . Alcohol use: Yes    Alcohol/week: 0.0 standard drinks    Comment: rarely  . Drug use:  No     Family Hx: The patient's family history includes Congestive Heart Failure in his mother; Heart attack in his paternal grandfather and paternal grandmother; Heart attack (age of onset: 29) in his father; Heart failure (age of onset: 54) in his mother. There is no history of Colon cancer.  ROS:   Please see the history of present illness.    As stated in the HPI and negative for all other systems.   Prior CV studies:   The following studies were reviewed today:    Labs/Other Tests and Data Reviewed:    EKG:  No ECG reviewed.  Recent Labs: 11/27/2017: TSH 3.740 12/19/2017: BUN 12; Creatinine, Ser 1.00; Hemoglobin 12.4; Platelets 243; Potassium 3.7; Sodium 140   Recent Lipid Panel Lab Results  Component Value Date/Time   CHOL 129 09/12/2016 01:24 PM   CHOL 136 10/29/2012 09:22 AM   TRIG 119 09/12/2016 01:24 PM   TRIG 139 04/28/2013 03:05 PM   TRIG 103 10/29/2012 09:22 AM   HDL 47 09/12/2016 01:24 PM   HDL 44 04/28/2013 03:05 PM   HDL 41 10/29/2012 09:22 AM   CHOLHDL 2.7 09/12/2016 01:24 PM   LDLCALC 58 09/12/2016 01:24 PM   LDLCALC 50 04/28/2013 03:05 PM   LDLCALC 74 10/29/2012 09:22 AM    Wt Readings from Last 3 Encounters:   11/21/18 210 lb (95.3 kg)  10/06/18 212 lb (96.2 kg)  06/04/18 197 lb 3.2 oz (89.4 kg)     Objective:    Vital Signs:  BP (!) 146/75   Pulse 62   Ht 5' 10.5" (1.791 m)   Wt 210 lb (95.3 kg)   BMI 29.71 kg/m    VITAL SIGNS:  reviewed GEN:  no acute distress  ASSESSMENT & PLAN:    CAD S/P percutaneous coronary angioplasty The patient has no new sypmtoms.  No further cardiovascular testing is indicated.  We will continue with aggressive risk reduction and meds as listed.  Essential hypertension The blood pressure is not well controlled and increase his Norvasc to 7.5 mg daily.  Palpitations These are not particularly problematic.  No change in therapy.  Dyslipidemia He is overdue for follow up and I will defer to [place], FNP  Continue on the meds as listed.  Bradycardia He has not tolerated the beta-blocker because of bradycardia.  He will remain off this medication.   COVID-19 Education: The signs and symptoms of COVID-19 were discussed with the patient and how to seek care for testing (follow up with PCP or arrange E-visit).  The importance of social distancing was discussed today.  Time:   Today, I have spent 25 minutes with the patient with telehealth technology discussing the above problems.     Medication Adjustments/Labs and Tests Ordered: Current medicines are reviewed at length with the patient today.  Concerns regarding medicines are outlined above.   Tests Ordered: No orders of the defined types were placed in this encounter.   Medication Changes: No orders of the defined types were placed in this encounter.   Disposition:  Follow up 12 months  Signed, Hooper Breeding, MD  11/21/2018 10:37 AM    [place]

## 2018-11-21 ENCOUNTER — Telehealth (INDEPENDENT_AMBULATORY_CARE_PROVIDER_SITE_OTHER): Payer: BLUE CROSS/BLUE SHIELD | Admitting: Cardiology

## 2018-11-21 ENCOUNTER — Encounter: Payer: Self-pay | Admitting: Cardiology

## 2018-11-21 DIAGNOSIS — I10 Essential (primary) hypertension: Secondary | ICD-10-CM | POA: Diagnosis not present

## 2018-11-21 MED ORDER — AMLODIPINE BESYLATE 5 MG PO TABS: 7.5000 mg | ORAL_TABLET | Freq: Every day | ORAL | 3 refills | Status: DC

## 2018-11-21 MED ORDER — HYDROCHLOROTHIAZIDE 25 MG PO TABS: 25.0000 mg | ORAL_TABLET | Freq: Every day | ORAL | 3 refills | Status: DC

## 2018-11-21 NOTE — Patient Instructions (Addendum)
Medication Instructions:  INCREASE- Amlodipine 7.5 mg (1 1/2 tablets) daily  If you need a refill on your cardiac medications before your next appointment, please call your pharmacy.  Labwork: None Ordered   Testing/Procedures: None Ordere   Follow-Up: You will need a follow up appointment in 12 months.  Please call our office 2 months in advance to schedule this appointment.  You may see Minus Breeding, MD or one of the following Advanced Practice Providers on your designated Care Team:   Rosaria Ferries, PA-C . Jory Sims, DNP, ANP    , Jory Sims, DNP, [place], you and your health needs are our priority.  As part of our continuing mission to provide you with exceptional heart care, we have created designated Provider Care Teams.  These Care Teams include your primary Cardiologist (physician) and Advanced Practice Providers (APPs -  Physician Assistants and Nurse Practitioners) who all work together to provide you with the care you need, when you need it.  Thank you for choosing [facility] at [facility]!!

## 2018-11-26 DIAGNOSIS — M5417 Radiculopathy, lumbosacral region: Secondary | ICD-10-CM | POA: Diagnosis not present

## 2018-12-02 ENCOUNTER — Other Ambulatory Visit: Payer: Self-pay

## 2018-12-02 ENCOUNTER — Encounter: Payer: Self-pay | Admitting: Family Medicine

## 2018-12-02 ENCOUNTER — Ambulatory Visit (INDEPENDENT_AMBULATORY_CARE_PROVIDER_SITE_OTHER): Payer: BLUE CROSS/BLUE SHIELD | Admitting: Family Medicine

## 2018-12-02 DIAGNOSIS — I2510 Atherosclerotic heart disease of native coronary artery without angina pectoris: Secondary | ICD-10-CM

## 2018-12-02 DIAGNOSIS — K219 Gastro-esophageal reflux disease without esophagitis: Secondary | ICD-10-CM

## 2018-12-02 DIAGNOSIS — J3089 Other allergic rhinitis: Secondary | ICD-10-CM | POA: Diagnosis not present

## 2018-12-02 DIAGNOSIS — I10 Essential (primary) hypertension: Secondary | ICD-10-CM | POA: Diagnosis not present

## 2018-12-02 DIAGNOSIS — E782 Mixed hyperlipidemia: Secondary | ICD-10-CM | POA: Diagnosis not present

## 2018-12-02 DIAGNOSIS — E039 Hypothyroidism, unspecified: Secondary | ICD-10-CM | POA: Diagnosis not present

## 2018-12-02 DIAGNOSIS — T7800XD Anaphylactic reaction due to unspecified food, subsequent encounter: Secondary | ICD-10-CM

## 2018-12-02 DIAGNOSIS — Z9861 Coronary angioplasty status: Secondary | ICD-10-CM

## 2018-12-02 MED ORDER — PANTOPRAZOLE SODIUM 40 MG PO TBEC: 40.0000 mg | DELAYED_RELEASE_TABLET | Freq: Every day | ORAL | 3 refills | Status: DC

## 2018-12-02 MED ORDER — ATORVASTATIN CALCIUM 80 MG PO TABS: 80.0000 mg | ORAL_TABLET | Freq: Every day | ORAL | 2 refills | Status: DC

## 2018-12-02 MED ORDER — LEVOTHYROXINE SODIUM 100 MCG PO TABS: 100.0000 ug | ORAL_TABLET | Freq: Every day | ORAL | 2 refills | Status: DC

## 2018-12-02 MED ORDER — EPINEPHRINE 0.3 MG/0.3ML IJ SOAJ: 0.3000 mg | INTRAMUSCULAR | 3 refills | Status: DC | PRN

## 2018-12-02 MED ORDER — LEVOCETIRIZINE DIHYDROCHLORIDE 5 MG PO TABS: 5.0000 mg | ORAL_TABLET | Freq: Every evening | ORAL | 3 refills | Status: DC

## 2018-12-02 NOTE — Progress Notes (Signed)
Virtual Visit via telephone Note Due to COVID-19, visit is conducted virtually and was requested by patient. This visit type was conducted due to national recommendations for restrictions regarding the COVID-19 Pandemic (e.g. social distancing) in an effort to limit this patient's exposure and mitigate transmission in our community.  Due to her co-morbid illnesses, this patient is at least at moderate risk for complications without adequate follow up.  This format is felt to be most appropriate for this patient at this time.  All issues noted in this document were discussed and addressed.  A physical exam was not performed with this format.   I connected with [name] on 12/02/18 at 0850 by telephone and verified that I am speaking with the correct person using two identifiers. Jeffrey Hooper is currently located at home and family is currently with them during visit. The provider, Monia Pouch, FNP is located in their office at time of visit.  I discussed the limitations, risks, security and privacy concerns of performing an evaluation and management service by telephone and the availability of in person appointments. I also discussed with the patient that there may be a patient responsible charge related to this service. The patient expressed understanding and agreed to proceed.  Subjective:  Patient ID: Jeffrey Hooper, male    DOB: 01/15/1961, 58 y.o.   MRN: [id number]  Chief Complaint:  Medical Management of Chronic Issues   HPI: Jeffrey Hooper is a 58 y.o. male presenting on 12/02/2018 for Medical Management of Chronic Issues   1. Essential hypertension Followed by Dr. [name] who recently increased his Norvasc to 7.5 mg. States he has been doing well with this increase.  Complaint with meds - Yes Checking BP at home ranging 124/74 Exercising Regularly - Not as much as usual due to back pain Watching Salt intake - Yes Pertinent ROS:  Headache - No Chest pain - No Dyspnea  - No Palpitations - No LE edema - Yes, slight ankle edema They report good compliance with medications and can restate their regimen by memory. No medication side effects.  BP Readings from Last 3 Encounters:  11/21/18 (!) 146/75  10/06/18 139/78  06/04/18 134/81     2. Mixed hyperlipidemia Compliant with medications. Does try to watch diet. Has not been exercising as much as usual due to back pain.   3. Perennial allergic rhinitis Doing well on current medications. Taking them as prescribed without associated side effects.   4. Hypothyroidism, acquired No fatigue, brain fog, palpitations, weight changes, constipation, diarrhea, hair changes, nail changes, or skin changes. Compliant with medications.   5. Gastroesophageal reflux disease, esophagitis presence not specified Well controlled except for when he consumes caffeine. This seems to exacerbate the reflux. Denies symptoms any other time. No sore throat, cough, voice changes, dysphagia, or hemoptysis.   6. Anaphylactic shock due to food, subsequent encounter Has significant reactions to shell fish and hoofed animal protein. Does avoid these items. States he has not had to use his Epi pen and it is about to expire and he needs a refill.   7. CAD S/P percutaneous coronary angioplasty Followed by cardiology. No new symptoms. No angina. States he is doing well.    Relevant past medical, surgical, family, and social history reviewed and updated as indicated.  Allergies and medications reviewed and updated.   Past Medical History:  Diagnosis Date  . Allergy   . Anxiety   . Arthritis   . CAD (coronary artery disease),  native coronary artery 12/03/2015  . Coronary artery disease   . Dysrhythmia    "irregular and PAC'S"  . GERD (gastroesophageal reflux disease)   . Head injury with loss of consciousness ([place])    back in the 1980's  . History of kidney stones   . Hyperlipidemia   . Hypertension   . Hypothyroid   . Multiple  benign nevi   . Neuromuscular disorder ([place]) 2011   LEFT RADIAL NERVE SURGERY R/T TRAUMA  . Pneumonia 2014, 2017  . Prostatism   . Pulmonary nodule     Past Surgical History:  Procedure Laterality Date  . BACK SURGERY     T12 - L1  . CARDIAC CATHETERIZATION N/A 12/02/2015   Procedure: Left Heart Cath and Coronary Angiography;  Surgeon: Peter M Martinique, MD;  Location: [facility];  Service: Cardiovascular;  Laterality: N/A;  . CARDIAC CATHETERIZATION N/A 12/02/2015   Procedure: Coronary Stent Intervention;  Surgeon: Peter M Martinique, MD;  Location: [facility];  Service: Cardiovascular;  Laterality: N/A;  mid LAD Promus 2.5x12  . CORONARY ANGIOPLASTY     one stent placed by Dr. P Martinique  . HARDWARE REMOVAL Right 08/26/2017   Procedure: Right Lumbar Five Revision of pedicle screw with Removal of Lumbar Five Screw;  Surgeon: Kristeen Miss, MD;  Location: [facility];  Service: Neurosurgery;  Laterality: Right;  posterior  . HARDWARE REMOVAL Right 12/27/2017   Procedure: Right Lumbar Two, Lumbar Three, Lumbar Four Pedicle screw removal with metrex;  Surgeon: Kristeen Miss, MD;  Location: [facility];  Service: Neurosurgery;  Laterality: Right;  Right L2 to L4 Pedicle screw removal with mterex  . MASS EXCISION  06/25/2012   Procedure: EXCISION MASS;  Surgeon: Wynonia Sours, MD;  Location: [facility];  Service: Orthopedics;  Laterality: Left;  transection of NEUROMA, BURYING RADIAL NERVE IN BRACHIORADIALIS LEFT SIDE  . radial nerve Left    cut in work injury  . SPINAL FUSION     C6-7  . TONSILLECTOMY  1982  . VASECTOMY      Social History   Socioeconomic History  . Marital status: Married    Spouse name: Not on file  . Number of children: 3  . Years of education: Not on file  . Highest education level: Not on file  Occupational History  . Occupation: [employer]  . Financial resource strain: Not on file  . Food insecurity:    Worry: Not  on file    Inability: Not on file  . Transportation needs:    Medical: Not on file    Non-medical: Not on file  Tobacco Use  . Smoking status: Former Smoker    Packs/day: 1.00    Years: 20.00    Pack years: 20.00    Types: Cigarettes    Last attempt to quit: 11/28/2006    Years since quitting: 12.0  . Smokeless tobacco: Never Used  Substance and Sexual Activity  . Alcohol use: Yes    Alcohol/week: 0.0 standard drinks    Comment: rarely  . Drug use: No  . Sexual activity: Yes  Lifestyle  . Physical activity:    Days per week: Not on file    Minutes per session: Not on file  . Stress: Not on file  Relationships  . Social connections:    Talks on phone: Not on file    Gets together: Not on file    Attends  religious service: Not on file    Active member of club or organization: Not on file    Attends meetings of clubs or organizations: Not on file    Relationship status: Not on file  . Intimate partner violence:    Fear of current or ex partner: Not on file    Emotionally abused: Not on file    Physically abused: Not on file    Forced sexual activity: Not on file  Other Topics Concern  . Not on file  Social History Narrative   Lives at home with wife, new 48 year old adopted boy.      Outpatient Encounter Medications as of 12/02/2018  Medication Sig  . acetaminophen (TYLENOL) 500 MG tablet Take 1,000 mg by mouth daily as needed for mild pain or headache.   [name] amLODipine (NORVASC) 5 MG tablet Take 1.5 tablets (7.5 mg total) by mouth daily.  [name] aspirin EC 81 MG tablet Take 81 mg by mouth at bedtime.  [name] atorvastatin (LIPITOR) 80 MG tablet Take 1 tablet (80 mg total) by mouth at bedtime.  . betamethasone dipropionate (DIPROLENE) 0.05 % cream APPLY TO AFFECTED AREA TWICE A DAY  . diphenhydrAMINE (BENADRYL) 25 MG tablet Take 50 mg by mouth 2 (two) times daily as needed for itching.   [name] EPINEPHrine 0.3 mg/0.3 mL IJ SOAJ injection Inject 0.3 mLs (0.3 mg total) into the muscle as  needed for anaphylaxis.  . fluticasone (FLONASE) 50 MCG/ACT nasal spray SPRAY 2 SPRAYS INTO EACH NOSTRIL EVERY DAY  . hydrochlorothiazide (HYDRODIURIL) 25 MG tablet Take 1 tablet (25 mg total) by mouth daily.  [name] HYDROcodone-acetaminophen (NORCO/VICODIN) 5-325 MG tablet Take 1 tablet by mouth every 6 (six) hours as needed. for pain  . levocetirizine (XYZAL) 5 MG tablet Take 1 tablet (5 mg total) by mouth every evening.  [name] levothyroxine (SYNTHROID) 100 MCG tablet Take 1 tablet (100 mcg total) by mouth daily.  . nitroGLYCERIN (NITROSTAT) 0.4 MG SL tablet Place 1 tablet (0.4 mg total) under the tongue every 5 (five) minutes as needed for chest pain.  . pantoprazole (PROTONIX) 40 MG tablet Take 1 tablet (40 mg total) by mouth daily.  [name] triamcinolone (KENALOG) 0.025 % ointment Apply 1 application topically 2 (two) times daily. (Patient taking differently: Apply 1 application topically 2 (two) times daily as needed (rash). )  . [DISCONTINUED] atorvastatin (LIPITOR) 80 MG tablet Take 1 tablet (80 mg total) by mouth at bedtime.  . [DISCONTINUED] EPINEPHrine 0.3 mg/0.3 mL IJ SOAJ injection Inject 0.3 mg into the muscle as needed for anaphylaxis.  . [DISCONTINUED] levocetirizine (XYZAL) 5 MG tablet Take 1 tablet (5 mg total) by mouth every evening.  . [DISCONTINUED] levothyroxine (SYNTHROID, LEVOTHROID) 100 MCG tablet Take 1 tablet (100 mcg total) by mouth daily.  . [DISCONTINUED] pantoprazole (PROTONIX) 40 MG tablet Take 1 tablet (40 mg total) by mouth daily.   No facility-administered encounter medications on file as of 12/02/2018.     Allergies  Allergen Reactions  . Alpha-Gal Rash and Other (See Comments)    SEVERE ALLERGY TO ANY MEAT OR MEAT DERIVED PRODUCTS FROM 4 LEGGED ANIMALS > > BEEF, PORK , GOATS, DEER, ETC. < < RESULT OF BITE FROM LONE STAR TICK  . Beef Extract Rash and Other (See Comments)  . Beef-Derived Products Rash and Other (See Comments)  . Lambs Quarters Rash and Other (See  Comments)  . Pork-Derived Products Rash and Other (See Comments)  . Prednisone Other (See Comments)  im depo medrol caused dizziness - pt passed out. Ask pt before giving (pt has taken since the reaction with no issues)  . Brilinta [Ticagrelor] Hives  . Doxycycline Hives  . Methylprednisolone Acetate     im depo medrol caused dizziness - pt passed out. Ask pt before giving   . Penicillins Hives    Has patient had a PCN reaction causing immediate rash, facial/tongue/throat swelling, SOB or lightheadedness with hypotension: NO Pt states has has taken PCN after Vibramycin and not had any problems. Has patient had a PCN reaction causing severe rash involving mucus membranes or skin necrosis: No Has patient had a PCN reaction that required hospitalization No Has patient had a PCN reaction occurring within the last 10 years: No If all of the above answers are "NO", then may proceed with Cephalospo  . Shellfish Allergy Hives  . Adhesive [Tape] Rash  . Fentanyl Rash    Reaction to adhesive, not the drug     Review of Systems  Constitutional: Negative for activity change, appetite change, chills, diaphoresis, fatigue, fever and unexpected weight change.  HENT: Positive for postnasal drip and rhinorrhea. Negative for congestion, sore throat, trouble swallowing and voice change.   Eyes: Negative for photophobia and visual disturbance.  Respiratory: Negative for cough, chest tightness, shortness of breath and wheezing.   Cardiovascular: Positive for leg swelling. Negative for chest pain and palpitations.  Gastrointestinal:       Reflux with caffeine intake  Endocrine: Negative for cold intolerance, heat intolerance, polydipsia, polyphagia and polyuria.  Genitourinary: Negative for decreased urine volume and difficulty urinating.  Musculoskeletal: Positive for back pain. Negative for arthralgias and myalgias.  Skin: Negative for color change and pallor.  Neurological: Negative for dizziness,  tremors, seizures, syncope, facial asymmetry, speech difficulty, weakness, light-headedness, numbness and headaches.  Hematological: Negative for adenopathy. Does not bruise/bleed easily.  Psychiatric/Behavioral: Negative for confusion.  All other systems reviewed and are negative.        Observations/Objective: No vital signs or physical exam, this was a telephone or virtual health encounter.  Pt alert and oriented, answers all questions appropriately, and able to speak in full sentences.    Assessment and Plan: Diagnoses and all orders for this visit:  Essential hypertension DASH diet. Diet and exercise encouraged. Followed by cardiology. Continue medications as prescribed. Report any persistent highs or lows.   Mixed hyperlipidemia Diet and exercise encouraged. Medications as prescribed. Will check labs in 3 months.  -     atorvastatin (LIPITOR) 80 MG tablet; Take 1 tablet (80 mg total) by mouth at bedtime.  Perennial allergic rhinitis Well controlled on Xyzal and Flonase. Continue as prescribed. Report any new or worsening symptoms.  -     levocetirizine (XYZAL) 5 MG tablet; Take 1 tablet (5 mg total) by mouth every evening.  Hypothyroidism, acquired Continue medications as prescribed. Will check labs in 3 months.  -     levothyroxine (SYNTHROID) 100 MCG tablet; Take 1 tablet (100 mcg total) by mouth daily.  Gastroesophageal reflux disease, esophagitis presence not specified Avoid triggers such as caffeine. Medications as prescribed. Report any new or worsening symptoms.  -     pantoprazole (PROTONIX) 40 MG tablet; Take 1 tablet (40 mg total) by mouth daily.  Anaphylactic shock due to food, subsequent encounter Epi pen expiring, refilled today. Avoid triggers. Pt aware to seek emergent evaluation if he has to use Epi pen.  -     EPINEPHrine 0.3 mg/0.3 mL IJ SOAJ injection;  Inject 0.3 mLs (0.3 mg total) into the muscle as needed for anaphylaxis.  CAD S/P percutaneous  coronary angioplasty Followed by cardiology. Stable. No new or recurring symptoms. Has not had to use SL NTG.     Follow Up Instructions: Return in about 3 months (around 03/03/2019), or if symptoms worsen or fail to improve, for HTN, Lipids, Thyroid, GERD. Appointment scheduled today.    I discussed the assessment and treatment plan with the patient. The patient was provided an opportunity to ask questions and all were answered. The patient agreed with the plan and demonstrated an understanding of the instructions.   The patient was advised to call back or seek an in-person evaluation if the symptoms worsen or if the condition fails to improve as anticipated.  The above assessment and management plan was discussed with the patient. The patient verbalized understanding of and has agreed to the management plan. Patient is aware to call the clinic if symptoms persist or worsen. Patient is aware when to return to the clinic for a follow-up visit. Patient educated on when it is appropriate to go to the emergency department.    I provided 25 minutes of non-face-to-face time during this encounter. The call started at 0850. The call ended at 0915.   Monia Pouch, FNP-C [facility] [address] [place], [place] [address] [phone]

## 2018-12-04 ENCOUNTER — Ambulatory Visit: Payer: Self-pay | Admitting: Family Medicine

## 2018-12-04 DIAGNOSIS — M5136 Other intervertebral disc degeneration, lumbar region: Secondary | ICD-10-CM | POA: Diagnosis not present

## 2018-12-04 DIAGNOSIS — M5417 Radiculopathy, lumbosacral region: Secondary | ICD-10-CM | POA: Diagnosis not present

## 2018-12-04 DIAGNOSIS — G894 Chronic pain syndrome: Secondary | ICD-10-CM | POA: Diagnosis not present

## 2018-12-04 DIAGNOSIS — M961 Postlaminectomy syndrome, not elsewhere classified: Secondary | ICD-10-CM | POA: Diagnosis not present

## 2018-12-12 DIAGNOSIS — G8929 Other chronic pain: Secondary | ICD-10-CM | POA: Diagnosis not present

## 2018-12-12 DIAGNOSIS — M5417 Radiculopathy, lumbosacral region: Secondary | ICD-10-CM | POA: Diagnosis not present

## 2018-12-12 DIAGNOSIS — M961 Postlaminectomy syndrome, not elsewhere classified: Secondary | ICD-10-CM | POA: Diagnosis not present

## 2018-12-12 DIAGNOSIS — M549 Dorsalgia, unspecified: Secondary | ICD-10-CM | POA: Diagnosis not present

## 2018-12-24 ENCOUNTER — Other Ambulatory Visit: Payer: Self-pay | Admitting: Pediatrics

## 2018-12-24 DIAGNOSIS — R609 Edema, unspecified: Secondary | ICD-10-CM

## 2019-01-26 ENCOUNTER — Other Ambulatory Visit: Payer: Self-pay | Admitting: Family Medicine

## 2019-01-26 DIAGNOSIS — R609 Edema, unspecified: Secondary | ICD-10-CM

## 2019-03-04 ENCOUNTER — Other Ambulatory Visit: Payer: Self-pay

## 2019-03-05 ENCOUNTER — Ambulatory Visit (INDEPENDENT_AMBULATORY_CARE_PROVIDER_SITE_OTHER): Payer: BC Managed Care – PPO | Admitting: Family Medicine

## 2019-03-05 ENCOUNTER — Encounter: Payer: Self-pay | Admitting: Family Medicine

## 2019-03-05 VITALS — BP 135/87 | HR 76 | Temp 99.0°F | Ht 70.5 in | Wt 207.0 lb

## 2019-03-05 DIAGNOSIS — J3089 Other allergic rhinitis: Secondary | ICD-10-CM

## 2019-03-05 DIAGNOSIS — E039 Hypothyroidism, unspecified: Secondary | ICD-10-CM

## 2019-03-05 DIAGNOSIS — I471 Supraventricular tachycardia, unspecified: Secondary | ICD-10-CM | POA: Insufficient documentation

## 2019-03-05 DIAGNOSIS — N289 Disorder of kidney and ureter, unspecified: Secondary | ICD-10-CM

## 2019-03-05 DIAGNOSIS — Z114 Encounter for screening for human immunodeficiency virus [HIV]: Secondary | ICD-10-CM

## 2019-03-05 DIAGNOSIS — D689 Coagulation defect, unspecified: Secondary | ICD-10-CM | POA: Insufficient documentation

## 2019-03-05 DIAGNOSIS — I25119 Atherosclerotic heart disease of native coronary artery with unspecified angina pectoris: Secondary | ICD-10-CM

## 2019-03-05 DIAGNOSIS — I10 Essential (primary) hypertension: Secondary | ICD-10-CM | POA: Diagnosis not present

## 2019-03-05 DIAGNOSIS — H9313 Tinnitus, bilateral: Secondary | ICD-10-CM

## 2019-03-05 DIAGNOSIS — E782 Mixed hyperlipidemia: Secondary | ICD-10-CM

## 2019-03-05 MED ORDER — HYDROCHLOROTHIAZIDE 50 MG PO TABS: 50.0000 mg | ORAL_TABLET | Freq: Every day | ORAL | 1 refills | Status: DC

## 2019-03-05 MED ORDER — FLUTICASONE PROPIONATE 50 MCG/ACT NA SUSP: NASAL | 1 refills | Status: DC

## 2019-03-05 NOTE — Progress Notes (Signed)
Subjective:  Patient ID: Jeffrey Hooper, male    DOB: [birth year], 58 y.o.   MRN: [id number]  Patient Care Team: Baruch Gouty, FNP as PCP - General (Family Medicine) Minus Breeding, MD as PCP - Cardiology (Cardiology)   Chief Complaint:  Medical Management of Chronic Issues, Hyperlipidemia, Hypertension, Hypothyroidism, and Gastroesophageal Reflux   HPI: Jeffrey Hooper is a 58 y.o. male presenting on 03/05/2019 for Medical Management of Chronic Issues, Hyperlipidemia, Hypertension, Hypothyroidism, and Gastroesophageal Reflux   1. Essential hypertension  Complaint with meds - Yes Checking BP at home ranging 130/75 Exercising Regularly - No Watching Salt intake - Yes Pertinent ROS:  Headache - No Chest pain - No Dyspnea - No Palpitations - No LE edema - No They report good compliance with medications and can restate their regimen by memory. No medication side effects.  BP Readings from Last 3 Encounters:  03/05/19 135/87  11/21/18 (!) 146/75  10/06/18 139/78     2. Supraventricular tachycardia (HCC) Chronic No palpitations, chest pain, shortness of breath, dizziness, or syncope. ASA daily. Followed by cardiology. Last appointment was in May 2020   3. Atherosclerosis of native coronary artery of native heart with angina pectoris (HCC) Chronic No chest pain. No exertional chest pain. No need for NTG in several months. ASA therapy daily. Followed by cardiology.    4. Mixed hyperlipidemia  Does try to watch diet. Is compliant with statin therapy without associated side effects.    5. Acquired hypothyroidism  No fatigue, bowel changes, heat or cold intolerance, skin changes, nail changes, or hair changes.    6. Renal insufficiency  No decrease in urine output. No swelling, weakness, or confusion.    7. Tinnitus of both ears  Ongoing for several months. Would like referral to audiology as he has been told he needs hearing aids in the past.      Relevant past  medical, surgical, family, and social history reviewed and updated as indicated.  Allergies and medications reviewed and updated. Date reviewed: Chart in Epic.   Past Medical History:  Diagnosis Date  . Allergy   . Anxiety   . Arthritis   . CAD (coronary artery disease), native coronary artery 12/03/2015  . Coronary artery disease   . Dysrhythmia    "irregular and PAC'S"  . GERD (gastroesophageal reflux disease)   . Head injury with loss of consciousness ([place])    back in the 1980's  . History of kidney stones   . Hyperlipidemia   . Hypertension   . Hypothyroid   . Multiple benign nevi   . Neuromuscular disorder ([place]) 2011   LEFT RADIAL NERVE SURGERY R/T TRAUMA  . Pneumonia 2014, 2017  . Prostatism   . Pulmonary nodule     Past Surgical History:  Procedure Laterality Date  . BACK SURGERY     T12 - L1  . CARDIAC CATHETERIZATION N/A 12/02/2015   Procedure: Left Heart Cath and Coronary Angiography;  Surgeon: Peter M Martinique, MD;  Location: [facility];  Service: Cardiovascular;  Laterality: N/A;  . CARDIAC CATHETERIZATION N/A 12/02/2015   Procedure: Coronary Stent Intervention;  Surgeon: Peter M Martinique, MD;  Location: [place] [facility];  Service: Cardiovascular;  Laterality: N/A;  mid LAD Promus 2.5x12  . CORONARY ANGIOPLASTY     one stent placed by Dr. P Martinique  . HARDWARE REMOVAL Right 08/26/2017   Procedure: Right Lumbar Five Revision of pedicle screw with Removal of Lumbar Five Screw;  Surgeon: Kristeen Miss, MD;  Location: [facility];  Service: Neurosurgery;  Laterality: Right;  posterior  . HARDWARE REMOVAL Right 12/27/2017   Procedure: Right Lumbar Two, Lumbar Three, Lumbar Four Pedicle screw removal with metrex;  Surgeon: Kristeen Miss, MD;  Location: [place] [facility];  Service: Neurosurgery;  Laterality: Right;  Right L2 to L4 Pedicle screw removal with mterex  . MASS EXCISION  06/25/2012   Procedure: EXCISION MASS;  Surgeon: Wynonia Sours, MD;  Location: [facility];   Service: Orthopedics;  Laterality: Left;  transection of NEUROMA, BURYING RADIAL NERVE IN BRACHIORADIALIS LEFT SIDE  . radial nerve Left    cut in work injury  . SPINAL FUSION     C6-7  . TONSILLECTOMY  1982  . VASECTOMY      Social History   Socioeconomic History  . Marital status: Married    Spouse name: Not on file  . Number of children: 3  . Years of education: Not on file  . Highest education level: Not on file  Occupational History  . Occupation: [employer]  . Financial resource strain: Not on file  . Food insecurity    Worry: Not on file    Inability: Not on file  . Transportation needs    Medical: Not on file    Non-medical: Not on file  Tobacco Use  . Smoking status: Former Smoker    Packs/day: 1.00    Years: 20.00    Pack years: 20.00    Types: Cigarettes    Quit date: 11/28/2006    Years since quitting: 12.2  . Smokeless tobacco: Never Used  Substance and Sexual Activity  . Alcohol use: Yes    Alcohol/week: 0.0 standard drinks    Comment: rarely  . Drug use: No  . Sexual activity: Yes  Lifestyle  . Physical activity    Days per week: Not on file    Minutes per session: Not on file  . Stress: Not on file  Relationships  . Social Herbalist on phone: Not on file    Gets together: Not on file    Attends religious service: Not on file    Active member of club or organization: Not on file    Attends meetings of clubs or organizations: Not on file    Relationship status: Not on file  . Intimate partner violence    Fear of current or ex partner: Not on file    Emotionally abused: Not on file    Physically abused: Not on file    Forced sexual activity: Not on file  Other Topics Concern  . Not on file  Social History Narrative   Lives at home with wife, new 37 year old adopted boy.      Outpatient Encounter Medications as of 03/05/2019  Medication Sig  . acetaminophen (TYLENOL) 500 MG tablet Take 1,000 mg by  mouth daily as needed for mild pain or headache.   [name] amLODipine (NORVASC) 5 MG tablet Take 1.5 tablets (7.5 mg total) by mouth daily.  [name] aspirin EC 81 MG tablet Take 81 mg by mouth at bedtime.  [name] atorvastatin (LIPITOR) 80 MG tablet Take 1 tablet (80 mg total) by mouth at bedtime.  . betamethasone dipropionate (DIPROLENE) 0.05 % cream APPLY TO AFFECTED AREA TWICE A DAY  . diphenhydrAMINE (BENADRYL) 25 MG tablet Take 50 mg by mouth 2 (two) times daily as needed for itching.   [name]  fluticasone (FLONASE) 50 MCG/ACT nasal spray SPRAY 2 SPRAYS INTO EACH NOSTRIL EVERY DAY  . HYDROcodone-acetaminophen (NORCO/VICODIN) 5-325 MG tablet Take 1 tablet by mouth every 6 (six) hours as needed. for pain  . levocetirizine (XYZAL) 5 MG tablet Take 1 tablet (5 mg total) by mouth every evening.  [name] levothyroxine (SYNTHROID) 100 MCG tablet Take 1 tablet (100 mcg total) by mouth daily.  . pantoprazole (PROTONIX) 40 MG tablet Take 1 tablet (40 mg total) by mouth daily.  [name] triamcinolone (KENALOG) 0.025 % ointment Apply 1 application topically 2 (two) times daily. (Patient taking differently: Apply 1 application topically 2 (two) times daily as needed (rash). )  . [DISCONTINUED] fluticasone (FLONASE) 50 MCG/ACT nasal spray SPRAY 2 SPRAYS INTO EACH NOSTRIL EVERY DAY  . [DISCONTINUED] hydrochlorothiazide (HYDRODIURIL) 25 MG tablet Take 1 tablet (25 mg total) by mouth daily.  [name] EPINEPHrine 0.3 mg/0.3 mL IJ SOAJ injection Inject 0.3 mLs (0.3 mg total) into the muscle as needed for anaphylaxis. (Patient not taking: Reported on 03/05/2019)  . hydrochlorothiazide (HYDRODIURIL) 50 MG tablet Take 1 tablet (50 mg total) by mouth daily.  . nitroGLYCERIN (NITROSTAT) 0.4 MG SL tablet Place 1 tablet (0.4 mg total) under the tongue every 5 (five) minutes as needed for chest pain. (Patient not taking: Reported on 03/05/2019)   No facility-administered encounter medications on file as of 03/05/2019.     Allergies  Allergen Reactions  .  Alpha-Gal Rash and Other (See Comments)    SEVERE ALLERGY TO ANY MEAT OR MEAT DERIVED PRODUCTS FROM 4 LEGGED ANIMALS > > BEEF, PORK , GOATS, DEER, ETC. < < RESULT OF BITE FROM LONE STAR TICK  . Beef Extract Rash and Other (See Comments)  . Beef-Derived Products Rash and Other (See Comments)  . Lambs Quarters Rash and Other (See Comments)  . Pork-Derived Products Rash and Other (See Comments)  . Prednisone Other (See Comments)    im depo medrol caused dizziness - pt passed out. Ask pt before giving (pt has taken since the reaction with no issues)  . Brilinta [Ticagrelor] Hives  . Doxycycline Hives  . Methylprednisolone Acetate     im depo medrol caused dizziness - pt passed out. Ask pt before giving   . Penicillins Hives    Has patient had a PCN reaction causing immediate rash, facial/tongue/throat swelling, SOB or lightheadedness with hypotension: NO Pt states has has taken PCN after Vibramycin and not had any problems. Has patient had a PCN reaction causing severe rash involving mucus membranes or skin necrosis: No Has patient had a PCN reaction that required hospitalization No Has patient had a PCN reaction occurring within the last 10 years: No If all of the above answers are "NO", then may proceed with Cephalospo  . Shellfish Allergy Hives  . Adhesive [Tape] Rash  . Fentanyl Rash    Reaction to adhesive, not the drug     Review of Systems  Constitutional: Negative for activity change, appetite change, chills, diaphoresis, fatigue, fever and unexpected weight change.  HENT: Positive for tinnitus. Negative for sore throat and trouble swallowing.   Eyes: Negative for photophobia and visual disturbance.  Respiratory: Negative for cough, choking, chest tightness, shortness of breath and wheezing.   Cardiovascular: Negative for chest pain, palpitations and leg swelling.  Gastrointestinal: Negative for abdominal distention, abdominal pain, anal bleeding, blood in stool, constipation,  diarrhea, nausea, rectal pain and vomiting.  Endocrine: Negative for cold intolerance, heat intolerance, polydipsia, polyphagia and polyuria.  Genitourinary: Negative for decreased  urine volume and difficulty urinating.  Musculoskeletal: Positive for arthralgias and back pain. Negative for myalgias.  Skin: Negative for color change and pallor.  Neurological: Negative for dizziness, tremors, seizures, syncope, facial asymmetry, speech difficulty, weakness, light-headedness, numbness and headaches.  Hematological: Negative for adenopathy. Does not bruise/bleed easily.  Psychiatric/Behavioral: Negative for confusion.  All other systems reviewed and are negative.       Objective:  BP 135/87   Pulse 76   Temp 99 F (37.2 C)   Ht 5' 10.5" (1.791 m)   Wt 207 lb (93.9 kg)   BMI 29.28 kg/m    Wt Readings from Last 3 Encounters:  03/05/19 207 lb (93.9 kg)  11/21/18 210 lb (95.3 kg)  10/06/18 212 lb (96.2 kg)    Physical Exam Vitals signs and nursing note reviewed.  Constitutional:      General: He is not in acute distress.    Appearance: Normal appearance. He is well-developed and well-groomed. He is not ill-appearing, toxic-appearing or diaphoretic.  HENT:     Head: Normocephalic and atraumatic.     Jaw: There is normal jaw occlusion.     Right Ear: Hearing, tympanic membrane, ear canal and external ear normal.     Left Ear: Hearing, tympanic membrane, ear canal and external ear normal.     Nose: Nose normal.     Mouth/Throat:     Lips: Pink.     Mouth: Mucous membranes are moist.     Pharynx: Oropharynx is clear. Uvula midline.  Eyes:     General: Lids are normal.     Extraocular Movements: Extraocular movements intact.     Conjunctiva/sclera: Conjunctivae normal.     Pupils: Pupils are equal, round, and reactive to light.  Neck:     Musculoskeletal: Normal range of motion and neck supple.     Thyroid: No thyroid mass, thyromegaly or thyroid tenderness.     Vascular: No  carotid bruit or JVD.     Trachea: Trachea and phonation normal.  Cardiovascular:     Rate and Rhythm: Normal rate and regular rhythm.     Chest Wall: PMI is not displaced.     Pulses: Normal pulses.     Heart sounds: Normal heart sounds. No murmur. No friction rub. No gallop.   Pulmonary:     Effort: Pulmonary effort is normal. No respiratory distress.     Breath sounds: Normal breath sounds. No wheezing.  Abdominal:     General: Bowel sounds are normal. There is no distension or abdominal bruit.     Palpations: Abdomen is soft. There is no hepatomegaly or splenomegaly.     Tenderness: There is no abdominal tenderness. There is no right CVA tenderness or left CVA tenderness.     Hernia: No hernia is present.  Musculoskeletal:     Right hip: Normal.     Left hip: Normal.     Cervical back: Normal.     Thoracic back: He exhibits decreased range of motion, tenderness and pain. He exhibits no bony tenderness, no swelling, no edema, no deformity, no laceration, no spasm and normal pulse.     Lumbar back: He exhibits decreased range of motion, tenderness and pain. He exhibits no bony tenderness, no swelling, no edema, no deformity, no laceration, no spasm and normal pulse.     Right lower leg: No edema.     Left lower leg: No edema.  Lymphadenopathy:     Cervical: No cervical adenopathy.  Skin:  General: Skin is warm and dry.     Capillary Refill: Capillary refill takes less than 2 seconds.     Coloration: Skin is not cyanotic, jaundiced or pale.     Findings: No rash.  Neurological:     General: No focal deficit present.     Mental Status: He is alert and oriented to person, place, and time.     Cranial Nerves: Cranial nerves are intact.     Sensory: Sensation is intact.     Motor: Motor function is intact.     Coordination: Coordination is intact.     Gait: Gait abnormal (antalgic gait, uses cane, chronic back pain, recent surgery to place nerve stimulator).     Deep Tendon  Reflexes: Reflexes are normal and symmetric.  Psychiatric:        Attention and Perception: Attention and perception normal.        Mood and Affect: Mood and affect normal.        Speech: Speech normal.        Behavior: Behavior normal. Behavior is cooperative.        Thought Content: Thought content normal.        Cognition and Memory: Cognition and memory normal.        Judgment: Judgment normal.     Results for orders placed or performed in visit on 10/06/18  Veritor Flu A/B Waived  Result Value Ref Range   Influenza A Negative Negative   Influenza B Negative Negative       Pertinent labs & imaging results that were available during my care of the patient were reviewed by me and considered in my medical decision making.  Assessment & Plan:  Geovonni was seen today for medical management of chronic issues, hyperlipidemia, hypertension, hypothyroidism and gastroesophageal reflux.  Diagnoses and all orders for this visit:  Essential hypertension DASH diet and exercise encouraged. Labs pending. Continue medications as prescribed.  -     CBC with Differential/Platelet -     CMP14+EGFR -     hydrochlorothiazide (HYDRODIURIL) 50 MG tablet; Take 1 tablet (50 mg total) by mouth daily. -     Microalbumin / creatinine urine ratio  Supraventricular tachycardia ([place]) Labs pending. Follow up with cardiology as scheduled.  -     CBC with Differential/Platelet -     Thyroid Panel With TSH  Atherosclerosis of native coronary artery of native heart with angina pectoris ([place]) Labs pending. Follow up with cardiology as scheduled.  -     CBC with Differential/Platelet -     CMP14+EGFR -     Lipid panel  Mixed hyperlipidemia Labs pending. Diet and exercise encouraged. Continue statin therapy.  -     Lipid panel  Acquired hypothyroidism Labs pending. Will adjust therapy if warranted.  -     Thyroid Panel With TSH  Renal insufficiency Labs pending.  -     CMP14+EGFR -     Microalbumin  / creatinine urine ratio  Tinnitus of both ears Ongoing. Will refer to audiology per pts request.  -     Ambulatory referral to Audiology  Encounter for screening for HIV -     HIV Antibody (routine testing w rflx)  Perennial allergic rhinitis Well controlled with Xyzal and flonase.  -     fluticasone (FLONASE) 50 MCG/ACT nasal spray; SPRAY 2 SPRAYS INTO EACH NOSTRIL EVERY DAY     Continue all other maintenance medications.  Follow up plan: Return in about 3 months (  around 06/05/2019), or if symptoms worsen or fail to improve, for HTN.  Educational handout given for survey and COVID-19  The above assessment and management plan was discussed with the patient. The patient verbalized understanding of and has agreed to the management plan. Patient is aware to call the clinic if symptoms persist or worsen. Patient is aware when to return to the clinic for a follow-up visit. Patient educated on when it is appropriate to go to the emergency department.   Monia Pouch, FNP-C [facility] [phone] 03/05/19

## 2019-03-05 NOTE — Patient Instructions (Signed)
It was a pleasure seeing you today, Jeffrey Hooper.  Information regarding what we discussed is included in this packet.  Please make an appointment to see me in 3 months.   In a few days you may receive a survey in the mail or online from [employer] regarding your visit with [place] today. Please take a moment to fill this out. Your feedback is very important to our office. It can help [place] better understand your needs as well as improve your experience and satisfaction. Thank you for taking your time to complete it. We care about you.  Because of recent events of COVID-19 ("Coronavirus"), please follow CDC recommendations:   1. Wash your hand frequently 2. Avoid touching your face 3. Stay away from people who are sick 4. If you have symptoms such as fever, cough, shortness of breath then call your healthcare provider for further guidance 5. If you are sick, STAY AT HOME, unless otherwise directed by your healthcare provider. 6. Follow directions from state and national officials regarding staying safe    Please feel free to call our office if any questions or concerns arise.  Warm Regards, Monia Pouch, FNP-C [facility] [address] [place], [place] [address] [phone]

## 2019-03-06 LAB — MICROALBUMIN / CREATININE URINE RATIO
Creatinine, Urine: 349 mg/dL
Microalb/Creat Ratio: 4 mg/g creat (ref 0–29)
Microalbumin, Urine: 15.5 ug/mL

## 2019-03-10 LAB — CBC WITH DIFFERENTIAL/PLATELET
Basophils Absolute: 0.1 10*3/uL (ref 0.0–0.2)
Basos: 1 %
EOS (ABSOLUTE): 0.2 10*3/uL (ref 0.0–0.4)
Eos: 2 %
Hematocrit: 45.1 % (ref 37.5–51.0)
Hemoglobin: 15 g/dL (ref 13.0–17.7)
Immature Grans (Abs): 0 10*3/uL (ref 0.0–0.1)
Immature Granulocytes: 0 %
Lymphocytes Absolute: 1.6 10*3/uL (ref 0.7–3.1)
Lymphs: 21 %
MCH: 28.4 pg (ref 26.6–33.0)
MCHC: 33.3 g/dL (ref 31.5–35.7)
MCV: 85 fL (ref 79–97)
Monocytes Absolute: 0.8 10*3/uL (ref 0.1–0.9)
Monocytes: 10 %
Neutrophils Absolute: 5.2 10*3/uL (ref 1.4–7.0)
Neutrophils: 66 %
Platelets: 251 10*3/uL (ref 150–450)
RBC: 5.29 x10E6/uL (ref 4.14–5.80)
RDW: 13.6 % (ref 11.6–15.4)
WBC: 7.8 10*3/uL (ref 3.4–10.8)

## 2019-03-10 LAB — CMP14+EGFR
ALT: 31 IU/L (ref 0–44)
AST: 22 IU/L (ref 0–40)
Albumin/Globulin Ratio: 2.4 — ABNORMAL HIGH (ref 1.2–2.2)
Albumin: 4.8 g/dL (ref 3.8–4.9)
Alkaline Phosphatase: 119 IU/L — ABNORMAL HIGH (ref 39–117)
BUN/Creatinine Ratio: 19 (ref 9–20)
BUN: 21 mg/dL (ref 6–24)
Bilirubin Total: 1 mg/dL (ref 0.0–1.2)
CO2: 26 mmol/L (ref 20–29)
Calcium: 9.9 mg/dL (ref 8.7–10.2)
Chloride: 96 mmol/L (ref 96–106)
Creatinine, Ser: 1.13 mg/dL (ref 0.76–1.27)
GFR calc Af Amer: 83 mL/min/{1.73_m2} (ref >59)
GFR calc non Af Amer: 72 mL/min/{1.73_m2} (ref >59)
Globulin, Total: 2 g/dL (ref 1.5–4.5)
Glucose: 97 mg/dL (ref 65–99)
Potassium: 3.6 mmol/L (ref 3.5–5.2)
Sodium: 144 mmol/L (ref 134–144)
Total Protein: 6.8 g/dL (ref 6.0–8.5)

## 2019-03-10 LAB — LIPID PANEL
Chol/HDL Ratio: 3.1 ratio (ref 0.0–5.0)
Cholesterol, Total: 141 mg/dL (ref 100–199)
HDL: 46 mg/dL (ref >39)
LDL Calculated: 65 mg/dL (ref 0–99)
Triglycerides: 148 mg/dL (ref 0–149)
VLDL Cholesterol Cal: 30 mg/dL (ref 5–40)

## 2019-03-10 LAB — THYROID PANEL WITH TSH
Free Thyroxine Index: 3.8 (ref 1.2–4.9)
T3 Uptake Ratio: 29 % (ref 24–39)
T4, Total: 13.2 ug/dL — ABNORMAL HIGH (ref 4.5–12.0)
TSH: 2.2 u[IU]/mL (ref 0.450–4.500)

## 2019-03-10 LAB — HIV ANTIBODY (ROUTINE TESTING W REFLEX): HIV Screen 4th Generation wRfx: REACTIVE — AB

## 2019-03-10 LAB — RNA QUALITATIVE: HIV 1 RNA Qualitative: NEGATIVE

## 2019-03-10 LAB — HIV 1/2 AB DIFFERENTIATION
HIV 1 Ab: NEGATIVE
HIV 2 Ab: NEGATIVE
NOTE (HIV CONF MULTIP: NEGATIVE

## 2019-03-17 ENCOUNTER — Telehealth: Payer: Self-pay | Admitting: Family Medicine

## 2019-03-17 NOTE — Telephone Encounter (Signed)
Have him make an appointment to discuss this further please. Thanks.

## 2019-03-17 NOTE — Telephone Encounter (Signed)
Appt made.  Patient aware  

## 2019-03-19 ENCOUNTER — Encounter: Payer: Self-pay | Admitting: Family Medicine

## 2019-03-19 ENCOUNTER — Ambulatory Visit (INDEPENDENT_AMBULATORY_CARE_PROVIDER_SITE_OTHER): Payer: BC Managed Care – PPO | Admitting: Family Medicine

## 2019-03-19 DIAGNOSIS — F411 Generalized anxiety disorder: Secondary | ICD-10-CM | POA: Diagnosis not present

## 2019-03-19 DIAGNOSIS — L989 Disorder of the skin and subcutaneous tissue, unspecified: Secondary | ICD-10-CM

## 2019-03-19 MED ORDER — PAROXETINE HCL 10 MG PO TABS: 10.0000 mg | ORAL_TABLET | Freq: Every day | ORAL | 3 refills | Status: DC

## 2019-03-19 NOTE — Progress Notes (Signed)
Virtual Visit via telephone Note Due to COVID-19 pandemic this visit was conducted virtually. This visit type was conducted due to national recommendations for restrictions regarding the COVID-19 Pandemic (e.g. social distancing, sheltering in place) in an effort to limit this patient's exposure and mitigate transmission in our community. All issues noted in this document were discussed and addressed.  A physical exam was not performed with this format.   I connected with Jeffrey Hooper on 03/19/19 at 0810 by telephone and verified that I am speaking with the correct person using two identifiers. Jeffrey Hooper is currently located at home and family is currently with them during visit. The provider, Monia Pouch, FNP is located in their office at time of visit.  I discussed the limitations, risks, security and privacy concerns of performing an evaluation and management service by telephone and the availability of in person appointments. I also discussed with the patient that there may be a patient responsible charge related to this service. The patient expressed understanding and agreed to proceed.  Subjective:  Patient ID: Jeffrey Hooper, male    DOB: [birth year], 58 y.o.   MRN: [id number]  Chief Complaint:  Anxiety and skin lesion   HPI: Jeffrey Hooper is a 58 y.o. male presenting on 03/19/2019 for Anxiety and skin lesion   Pt reports ongoing and increasing anxiety. Pt states he has had increasing symptoms for several months and now it is starting to affect his marriage. States he is very anxious and irritable. States he constantly worries about everything and this makes him irritable and snappy. States he has tried some medications in the past without success. States he feels it is time to start something to help control his racing thoughts and anxiety.  He also reports a flaky, red lesion to right cheek. States this has been there for some time. States over the last few weeks it has  increased in size and is changing color. No known exposure, injury, or new hygiene products. No fever, chills, weakness, headaches, or weight changes.   Anxiety Presents for initial visit. Onset was 1 to 6 months ago. The problem has been gradually worsening. Symptoms include compulsions, decreased concentration, excessive worry, insomnia, irritability, muscle tension, nervous/anxious behavior and obsessions. Patient reports no chest pain, confusion, depressed mood, dizziness, dry mouth, feeling of choking, hyperventilation, impotence, malaise, nausea, palpitations, panic, restlessness, shortness of breath or suicidal ideas. Symptoms occur most days. The severity of symptoms is moderate. The symptoms are aggravated by family issues, social activities and work stress. The quality of sleep is fair. Nighttime awakenings: occasional.   His past medical history is significant for anxiety/panic attacks, arrhythmia and CAD. There is no history of asthma, bipolar disorder, CHF, chronic lung disease, fibromyalgia, hyperthyroidism or suicide attempts. Past treatments include non-SSRI antidepressants and non-benzodiazephine anxiolytics. The treatment provided no relief. Compliance with prior treatments has been variable.     Relevant past medical, surgical, family, and social history reviewed and updated as indicated.  Allergies and medications reviewed and updated.   Past Medical History:  Diagnosis Date   Allergy    Anxiety    Arthritis    CAD (coronary artery disease), native coronary artery 12/03/2015   Coronary artery disease    Dysrhythmia    "irregular and PAC'S"   GERD (gastroesophageal reflux disease)    Head injury with loss of consciousness ([place])    back in the 1980's   History of kidney stones    Hyperlipidemia  Hypertension    Hypothyroid    Multiple benign nevi    Neuromuscular disorder ([place]) 2011   LEFT RADIAL NERVE SURGERY R/T TRAUMA   Pneumonia 2014, 2017    Prostatism    Pulmonary nodule     Past Surgical History:  Procedure Laterality Date   BACK SURGERY     T12 - L1   CARDIAC CATHETERIZATION N/A 12/02/2015   Procedure: Left Heart Cath and Coronary Angiography;  Surgeon: Peter M Martinique, MD;  Location: [facility];  Service: Cardiovascular;  Laterality: N/A;   CARDIAC CATHETERIZATION N/A 12/02/2015   Procedure: Coronary Stent Intervention;  Surgeon: Peter M Martinique, MD;  Location: [facility];  Service: Cardiovascular;  Laterality: N/A;  mid LAD Promus 2.5x12   CORONARY ANGIOPLASTY     one stent placed by Dr. P Martinique   HARDWARE REMOVAL Right 08/26/2017   Procedure: Right Lumbar Five Revision of pedicle screw with Removal of Lumbar Five Screw;  Surgeon: Kristeen Miss, MD;  Location: [facility];  Service: Neurosurgery;  Laterality: Right;  posterior   HARDWARE REMOVAL Right 12/27/2017   Procedure: Right Lumbar Two, Lumbar Three, Lumbar Four Pedicle screw removal with metrex;  Surgeon: Kristeen Miss, MD;  Location: [facility];  Service: Neurosurgery;  Laterality: Right;  Right L2 to L4 Pedicle screw removal with mterex   MASS EXCISION  06/25/2012   Procedure: EXCISION MASS;  Surgeon: Wynonia Sours, MD;  Location: [facility];  Service: Orthopedics;  Laterality: Left;  transection of NEUROMA, BURYING RADIAL NERVE IN BRACHIORADIALIS LEFT SIDE   radial nerve Left    cut in work injury   SPINAL FUSION     C6-7   TONSILLECTOMY  1982   VASECTOMY      Social History   Socioeconomic History   Marital status: Married    Spouse name: Not on file   Number of children: 3   Years of education: Not on file   Highest education level: Not on file  Occupational History   Occupation: [employer] strain: Not on file   Food insecurity    Worry: Not on file    Inability: Not on file   Transportation needs    Medical: Not on file    Non-medical: Not on file    Tobacco Use   Smoking status: Former Smoker    Packs/day: 1.00    Years: 20.00    Pack years: 20.00    Types: Cigarettes    Quit date: 11/28/2006    Years since quitting: 12.3   Smokeless tobacco: Never Used  Substance and Sexual Activity   Alcohol use: Yes    Alcohol/week: 0.0 standard drinks    Comment: rarely   Drug use: No   Sexual activity: Yes  Lifestyle   Physical activity    Days per week: Not on file    Minutes per session: Not on file   Stress: Not on file  Relationships   Social connections    Talks on phone: Not on file    Gets together: Not on file    Attends religious service: Not on file    Active member of club or organization: Not on file    Attends meetings of clubs or organizations: Not on file    Relationship status: Not on file   Intimate partner violence    Fear of current or ex partner: Not on file  Emotionally abused: Not on file    Physically abused: Not on file    Forced sexual activity: Not on file  Other Topics Concern   Not on file  Social History Narrative   Lives at home with wife, new 64 year old adopted boy.      Outpatient Encounter Medications as of 03/19/2019  Medication Sig   acetaminophen (TYLENOL) 500 MG tablet Take 1,000 mg by mouth daily as needed for mild pain or headache.    amLODipine (NORVASC) 5 MG tablet Take 1.5 tablets (7.5 mg total) by mouth daily.   aspirin EC 81 MG tablet Take 81 mg by mouth at bedtime.   atorvastatin (LIPITOR) 80 MG tablet Take 1 tablet (80 mg total) by mouth at bedtime.   betamethasone dipropionate (DIPROLENE) 0.05 % cream APPLY TO AFFECTED AREA TWICE A DAY   diphenhydrAMINE (BENADRYL) 25 MG tablet Take 50 mg by mouth 2 (two) times daily as needed for itching.    EPINEPHrine 0.3 mg/0.3 mL IJ SOAJ injection Inject 0.3 mLs (0.3 mg total) into the muscle as needed for anaphylaxis. (Patient not taking: Reported on 03/05/2019)   fluticasone (FLONASE) 50 MCG/ACT nasal spray SPRAY 2 SPRAYS  INTO EACH NOSTRIL EVERY DAY   hydrochlorothiazide (HYDRODIURIL) 50 MG tablet Take 1 tablet (50 mg total) by mouth daily.   HYDROcodone-acetaminophen (NORCO/VICODIN) 5-325 MG tablet Take 1 tablet by mouth every 6 (six) hours as needed. for pain   levocetirizine (XYZAL) 5 MG tablet Take 1 tablet (5 mg total) by mouth every evening.   levothyroxine (SYNTHROID) 100 MCG tablet Take 1 tablet (100 mcg total) by mouth daily.   nitroGLYCERIN (NITROSTAT) 0.4 MG SL tablet Place 1 tablet (0.4 mg total) under the tongue every 5 (five) minutes as needed for chest pain. (Patient not taking: Reported on 03/05/2019)   pantoprazole (PROTONIX) 40 MG tablet Take 1 tablet (40 mg total) by mouth daily.   PARoxetine (PAXIL) 10 MG tablet Take 1 tablet (10 mg total) by mouth daily.   triamcinolone (KENALOG) 0.025 % ointment Apply 1 application topically 2 (two) times daily. (Patient taking differently: Apply 1 application topically 2 (two) times daily as needed (rash). )   No facility-administered encounter medications on file as of 03/19/2019.     Allergies  Allergen Reactions   Alpha-Gal Rash and Other (See Comments)    SEVERE ALLERGY TO ANY MEAT OR MEAT DERIVED PRODUCTS FROM 4 LEGGED ANIMALS > > BEEF, PORK , GOATS, DEER, ETC. < < RESULT OF BITE FROM LONE STAR TICK   Beef Extract Rash and Other (See Comments)   Beef-Derived Products Rash and Other (See Comments)   Lambs Quarters Rash and Other (See Comments)   Pork-Derived Products Rash and Other (See Comments)   Prednisone Other (See Comments)    im depo medrol caused dizziness - pt passed out. Ask pt before giving (pt has taken since the reaction with no issues)   Brilinta [Ticagrelor] Hives   Doxycycline Hives   Methylprednisolone Acetate     im depo medrol caused dizziness - pt passed out. Ask pt before giving    Penicillins Hives    Has patient had a PCN reaction causing immediate rash, facial/tongue/throat swelling, SOB or  lightheadedness with hypotension: NO Pt states has has taken PCN after Vibramycin and not had any problems. Has patient had a PCN reaction causing severe rash involving mucus membranes or skin necrosis: No Has patient had a PCN reaction that required hospitalization No Has patient had a  PCN reaction occurring within the last 10 years: No If all of the above answers are "NO", then may proceed with Cephalospo   Shellfish Allergy Hives   Adhesive [Tape] Rash   Fentanyl Rash    Reaction to adhesive, not the drug     Review of Systems  Constitutional: Positive for irritability. Negative for activity change, appetite change, chills, diaphoresis, fatigue, fever and unexpected weight change.  Eyes: Negative for photophobia and visual disturbance.  Respiratory: Negative for cough, choking, chest tightness, shortness of breath and wheezing.   Cardiovascular: Negative for chest pain and palpitations.  Gastrointestinal: Negative for abdominal pain, constipation, diarrhea, nausea and vomiting.  Genitourinary: Negative for decreased urine volume, difficulty urinating and impotence.  Skin: Positive for rash (right cheek).  Neurological: Negative for dizziness, tremors, seizures, syncope, facial asymmetry, speech difficulty, weakness, light-headedness, numbness and headaches.  Hematological: Does not bruise/bleed easily.  Psychiatric/Behavioral: Positive for agitation, decreased concentration, dysphoric mood and sleep disturbance. Negative for behavioral problems, confusion, hallucinations, self-injury and suicidal ideas. The patient is nervous/anxious and has insomnia. The patient is not hyperactive.   All other systems reviewed and are negative.        Observations/Objective: No vital signs or physical exam, this was a telephone or virtual health encounter.  Pt alert and oriented, answers all questions appropriately, and able to speak in full sentences.    Assessment and Plan: Jameire was seen  today for anxiety and skin lesion.  Diagnoses and all orders for this visit:  GAD (generalized anxiety disorder) Due to ongoing and worsening symptoms, will initiate paroxetine today. Pt aware of stress management techniques and medication compliance. Pt aware to follow up in 2-4 weeks for reevaluation. Report any new or worsening symptoms. Medications as prescribed. Recommended to take at bedtime.  -     PARoxetine (PAXIL) 10 MG tablet; Take 1 tablet (10 mg total) by mouth daily.  Skin lesion of cheek Due to location of lesion on face, will refer to dermatology for evaluation and treatment. .  -     Ambulatory referral to Dermatology     Follow Up Instructions: Return in about 4 weeks (around 04/16/2019), or if symptoms worsen or fail to improve, for anxiety.    I discussed the assessment and treatment plan with the patient. The patient was provided an opportunity to ask questions and all were answered. The patient agreed with the plan and demonstrated an understanding of the instructions.   The patient was advised to call back or seek an in-person evaluation if the symptoms worsen or if the condition fails to improve as anticipated.  The above assessment and management plan was discussed with the patient. The patient verbalized understanding of and has agreed to the management plan. Patient is aware to call the clinic if symptoms persist or worsen. Patient is aware when to return to the clinic for a follow-up visit. Patient educated on when it is appropriate to go to the emergency department.    I provided 25 minutes of non-face-to-face time during this encounter. The call started at 0810. The call ended at 67. The other time was used for coordination of care.    Monia Pouch, FNP-C [facility] [address] [place], [place] [address] [phone] 03/19/19

## 2019-03-26 DIAGNOSIS — D485 Neoplasm of uncertain behavior of skin: Secondary | ICD-10-CM | POA: Diagnosis not present

## 2019-03-26 DIAGNOSIS — L570 Actinic keratosis: Secondary | ICD-10-CM | POA: Diagnosis not present

## 2019-03-26 DIAGNOSIS — D1801 Hemangioma of skin and subcutaneous tissue: Secondary | ICD-10-CM | POA: Diagnosis not present

## 2019-03-26 DIAGNOSIS — D225 Melanocytic nevi of trunk: Secondary | ICD-10-CM | POA: Diagnosis not present

## 2019-03-26 DIAGNOSIS — L821 Other seborrheic keratosis: Secondary | ICD-10-CM | POA: Diagnosis not present

## 2019-03-28 ENCOUNTER — Other Ambulatory Visit: Payer: Self-pay | Admitting: Family Medicine

## 2019-03-28 DIAGNOSIS — J3089 Other allergic rhinitis: Secondary | ICD-10-CM

## 2019-04-10 ENCOUNTER — Other Ambulatory Visit: Payer: Self-pay | Admitting: Family Medicine

## 2019-04-10 DIAGNOSIS — F411 Generalized anxiety disorder: Secondary | ICD-10-CM

## 2019-04-29 DIAGNOSIS — D225 Melanocytic nevi of trunk: Secondary | ICD-10-CM | POA: Diagnosis not present

## 2019-04-30 ENCOUNTER — Other Ambulatory Visit: Payer: Self-pay | Admitting: *Deleted

## 2019-04-30 MED ORDER — BETAMETHASONE DIPROPIONATE 0.05 % EX CREA: TOPICAL_CREAM | CUTANEOUS | 0 refills | Status: DC

## 2019-04-30 NOTE — Telephone Encounter (Signed)
Please advise 

## 2019-05-13 DIAGNOSIS — M5417 Radiculopathy, lumbosacral region: Secondary | ICD-10-CM | POA: Diagnosis not present

## 2019-05-13 DIAGNOSIS — I10 Essential (primary) hypertension: Secondary | ICD-10-CM | POA: Diagnosis not present

## 2019-05-13 DIAGNOSIS — Z6830 Body mass index (BMI) 30.0-30.9, adult: Secondary | ICD-10-CM | POA: Diagnosis not present

## 2019-05-23 IMAGING — MR MR LUMBAR SPINE W/O CM
4 of 5 series · 15 of 48 positions shown · non-contrast
Comparison: MRI lumbar spine 01/06/2015

CLINICAL DATA: Low back and bilateral leg pain and weakness for 2
years. History of prior lumbar surgery.

EXAM:
MRI LUMBAR SPINE WITHOUT CONTRAST
TECHNIQUE: Multiplanar, multisequence MR imaging of the lumbar spine was
performed. No intravenous contrast was administered.

[Series 3: T2 · sagittal · 4.0mm · 0.78mm/px · 6 of 16 slices shown (1 of 2)]
[im 1/16]
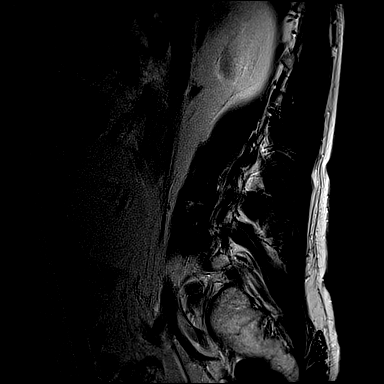
[im 4/16]
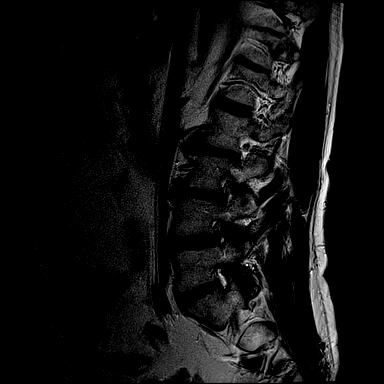
[im 7/16]
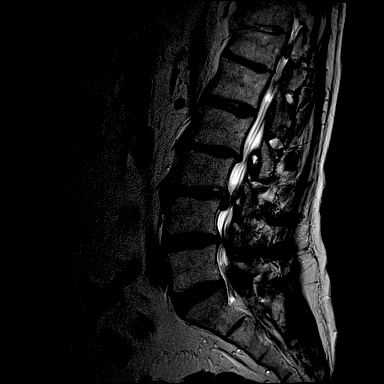
[im 10/16]
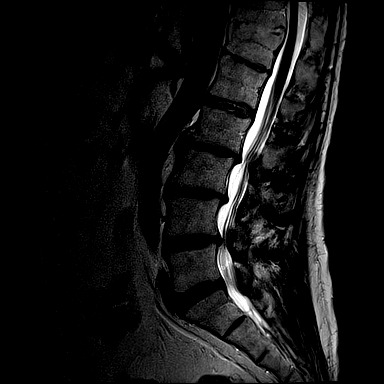
[im 13/16]
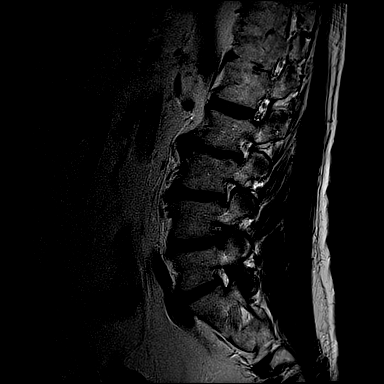
[im 16/16]
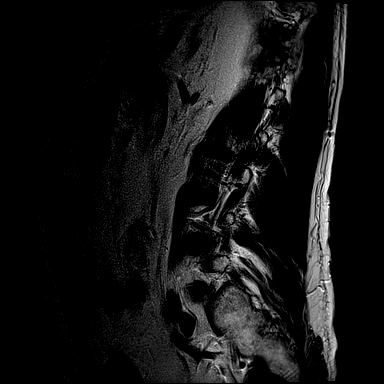

[Series 4: T1 · sagittal · 4.0mm · 0.39mm/px · 3 of 15 slices shown (1 of 2)]
[im 3/15]
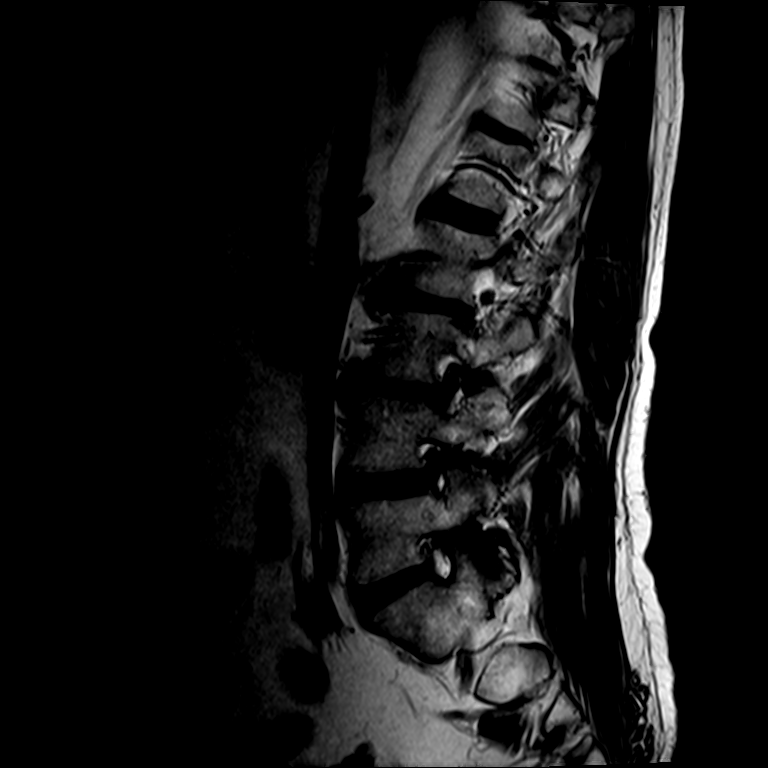
[im 9/15]
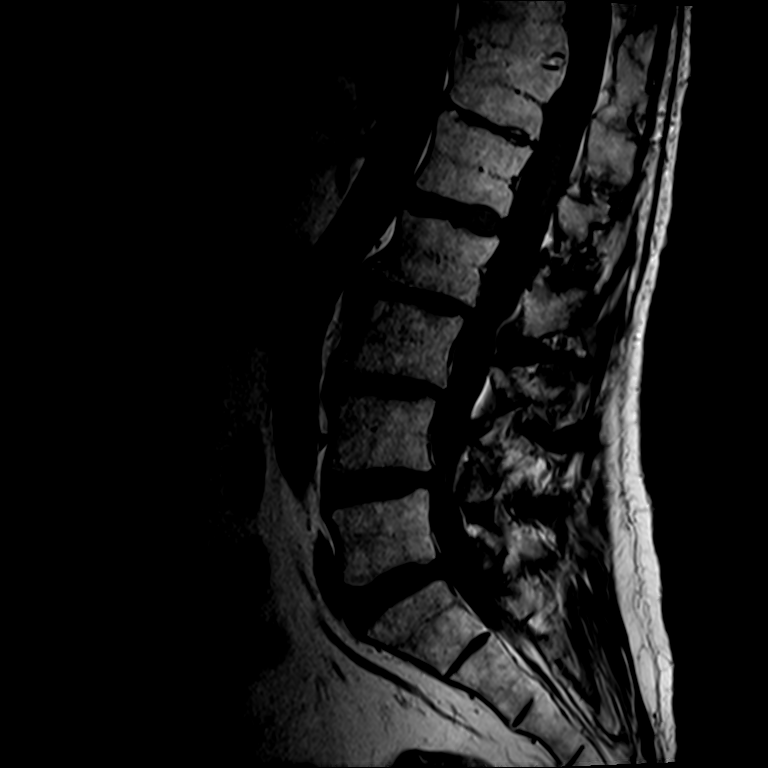
[im 15/15]
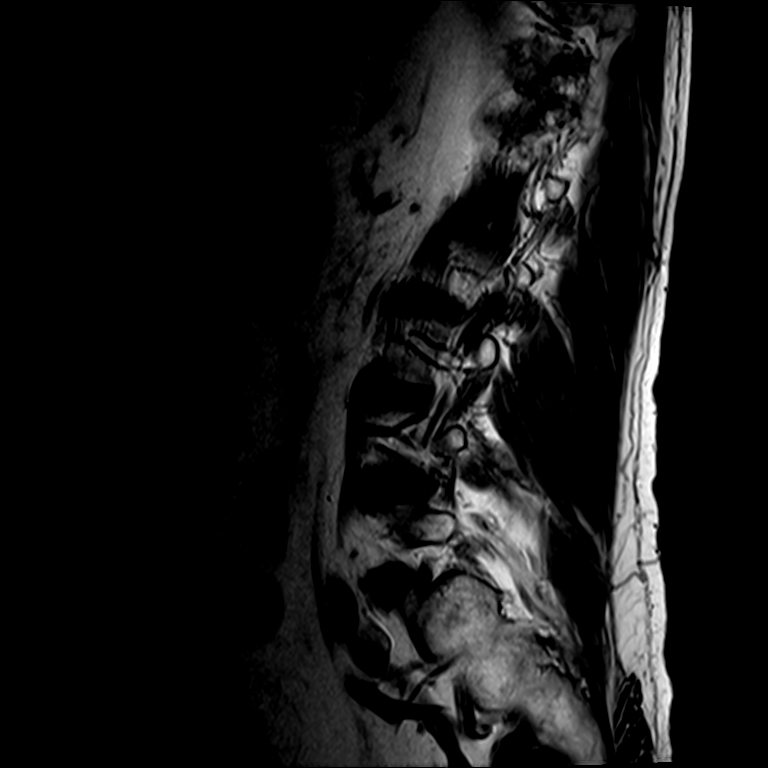

[Series 6: T2 · axial · 4.0mm · 0.24mm/px · z∈[-63,+78]mm · 3 of 39 slices shown (2 of 2)]
[im 6/39]
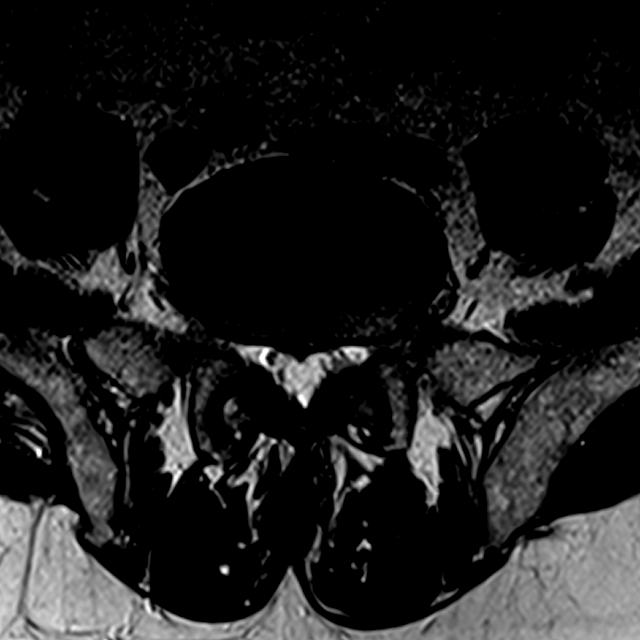
[im 20/39]
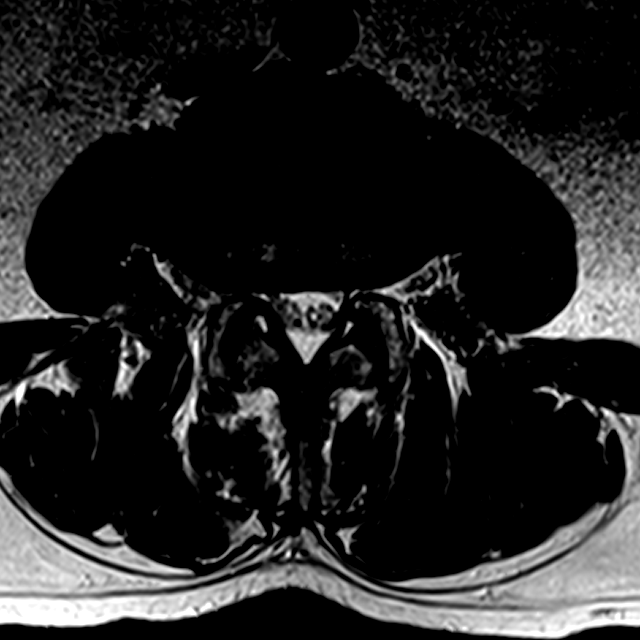
[im 33/39]
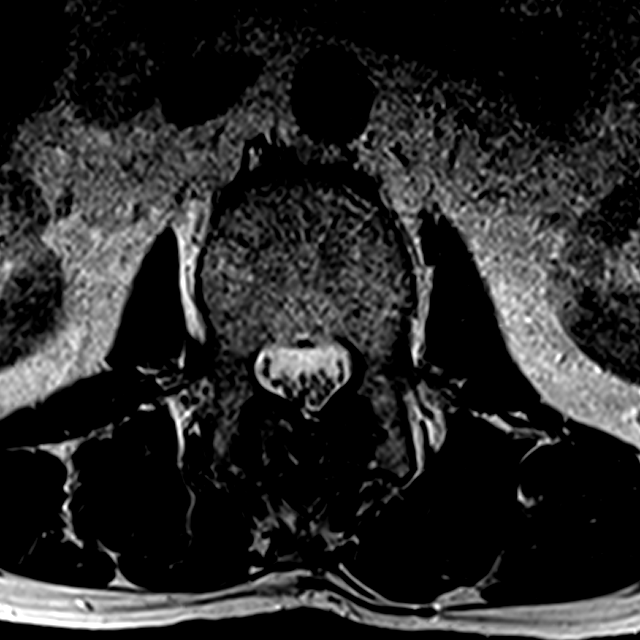

[Series 7: T1 · axial · 4.0mm · 0.24mm/px · z∈[-63,+78]mm · 3 of 39 slices shown (2 of 2)]
[im 6/39]
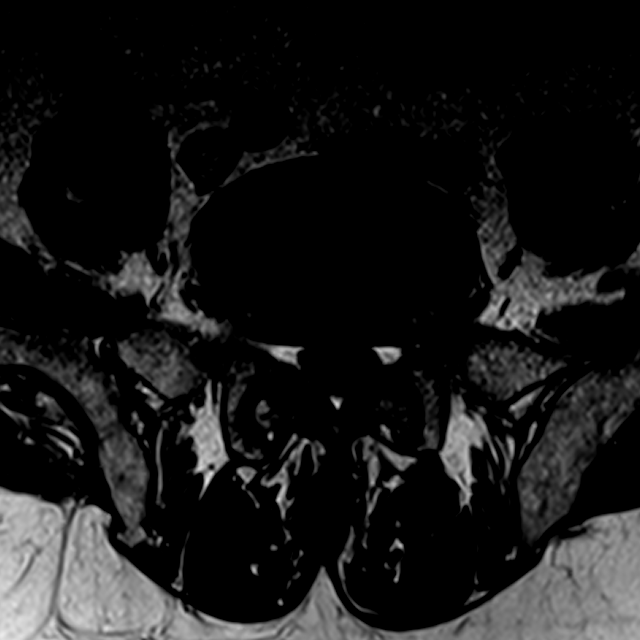
[im 20/39]
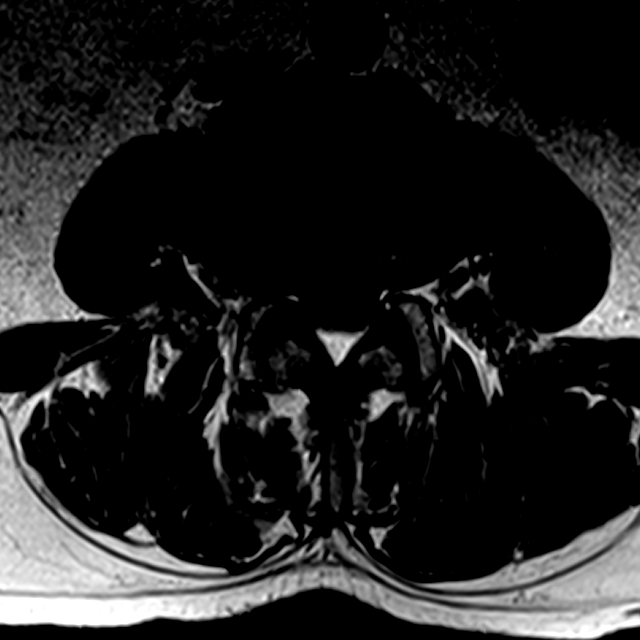
[im 33/39]
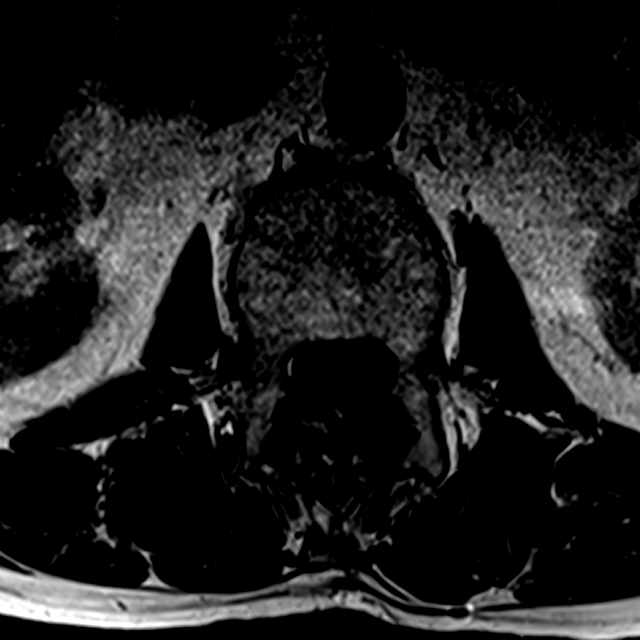

[15 of 48 positions shown; findings below may reference images not displayed]

FINDINGS: Segmentation: 5 lumbar type vertebral bodies. The last full
intervertebral disc space is labeled L5-S1. This correlates with the
prior MRI examination.

Alignment:  Normal overall alignment of the lumbar vertebral bodies.

Vertebrae: Normal marrow signal. No bone lesions or fracture. Prior
interbody fusion at T11-12 is noted.

Conus medullaris: Extends to the L1 level and appears normal.

Paraspinal and other soft tissues: No significant findings

Disc levels:

L1-2:  No significant findings.  Stable surgical changes.

L2-3: Degenerated bulging annulus, osteophytic ridging, facet
disease and ligamentum flavum buckling contributing to moderate
spinal and bilateral lateral recess stenosis. These findings are
progressive when compared to the prior MRI examination. No
significant foraminal stenosis.

L3-4: Bulging degenerated annulus, osteophytic ridging and facet
disease with flattening of the ventral thecal sac and narrowing of
the ventral CSF space. There is also a focal right paracentral disc
protrusion with mass effect on the right side of thecal sac which
could affect the right L4 nerve root in the lateral recess. This is
a new finding.

L4-5: Broad-based bulging annulus, short pedicles and facet disease
contributing to moderate to moderately severe spinal and bilateral
lateral recess stenosis. These findings are progressive. There is
also a new broad-based foraminal and extraforaminal disc protrusion
on the left which contacts and displaces the left L4 nerve root
extra foraminally.

L5-S1: Mild annular bulge and moderate to advanced facet disease but
no focal disc protrusion, spinal or foraminal stenosis.
IMPRESSION: 1. Progressive moderate multifactorial spinal and bilateral lateral
recess stenosis at L2-3.
2. Underlying mild spinal and bilateral lateral recess stenosis at
L3-4 with a new small right paracentral disc protrusion likely
effecting the right L4 nerve root in the lateral recess.
3. Progressive moderate to moderately severe multifactorial spinal
and bilateral lateral recess stenosis at L4-5.
4. New broad-based foraminal and extraforaminal disc protrusion on
the left at L4-5 which contacts and displaces the left L4 nerve root
extra foraminally.

## 2019-06-11 ENCOUNTER — Other Ambulatory Visit: Payer: Self-pay

## 2019-06-12 ENCOUNTER — Encounter: Payer: Self-pay | Admitting: Family Medicine

## 2019-06-12 ENCOUNTER — Ambulatory Visit (INDEPENDENT_AMBULATORY_CARE_PROVIDER_SITE_OTHER): Payer: BC Managed Care – PPO | Admitting: Family Medicine

## 2019-06-12 VITALS — BP 123/80 | HR 58 | Temp 99.5°F | Resp 20 | Ht 70.0 in | Wt 203.0 lb

## 2019-06-12 DIAGNOSIS — E782 Mixed hyperlipidemia: Secondary | ICD-10-CM

## 2019-06-12 DIAGNOSIS — R748 Abnormal levels of other serum enzymes: Secondary | ICD-10-CM | POA: Diagnosis not present

## 2019-06-12 DIAGNOSIS — E039 Hypothyroidism, unspecified: Secondary | ICD-10-CM | POA: Diagnosis not present

## 2019-06-12 DIAGNOSIS — F411 Generalized anxiety disorder: Secondary | ICD-10-CM

## 2019-06-12 DIAGNOSIS — I10 Essential (primary) hypertension: Secondary | ICD-10-CM | POA: Diagnosis not present

## 2019-06-12 DIAGNOSIS — K219 Gastro-esophageal reflux disease without esophagitis: Secondary | ICD-10-CM

## 2019-06-12 DIAGNOSIS — I471 Supraventricular tachycardia: Secondary | ICD-10-CM

## 2019-06-12 MED ORDER — HYDROCHLOROTHIAZIDE 50 MG PO TABS: 50.0000 mg | ORAL_TABLET | Freq: Every day | ORAL | 1 refills | Status: DC

## 2019-06-12 MED ORDER — PANTOPRAZOLE SODIUM 40 MG PO TBEC: 40.0000 mg | DELAYED_RELEASE_TABLET | Freq: Every day | ORAL | 3 refills | Status: DC

## 2019-06-12 MED ORDER — PAROXETINE HCL 20 MG PO TABS: 20.0000 mg | ORAL_TABLET | Freq: Every day | ORAL | 1 refills | Status: DC

## 2019-06-12 MED ORDER — ATORVASTATIN CALCIUM 80 MG PO TABS: 80.0000 mg | ORAL_TABLET | Freq: Every day | ORAL | 2 refills | Status: DC

## 2019-06-12 MED ORDER — LEVOTHYROXINE SODIUM 100 MCG PO TABS: 100.0000 ug | ORAL_TABLET | Freq: Every day | ORAL | 2 refills | Status: DC

## 2019-06-12 NOTE — Progress Notes (Signed)
Subjective:  Patient ID: [name], male    DOB: May 01, 1961, 58 y.o.   MRN: [id number]  Patient Care Team: Baruch Gouty, FNP as PCP - General (Family Medicine) Minus Breeding, MD as PCP - Cardiology (Cardiology)   Chief Complaint:  Medical Management of Chronic Issues   HPI: Jeffrey Hooper is a 58 y.o. male presenting on 06/12/2019 for Medical Management of Chronic Issues  1. GAD (generalized anxiety disorder) Doing well with Paxil. States he still has some anxiety. States this is exacerbated by stress at home due to virtual learning. He states he does not have panic. No SI or HI. No side effects from the medication.  GAD 7 : Generalized Anxiety Score 06/12/2019 03/19/2019 12/02/2018 02/26/2018  Nervous, Anxious, on Edge 2 2 0 3  Control/stop worrying 2 2 0 3  Worry too much - different things 2 2 0 3  Trouble relaxing _0 Restless 2 0 0 3  Easily annoyed or irritable 3 3 0 3  Afraid - awful might happen 1 0 0 3  Total GAD 7 Score _1 Anxiety Difficulty - - - Somewhat difficult    Depression screen [facility] 2/9 06/12/2019 03/19/2019 03/05/2019 12/02/2018 10/06/2018  Decreased Interest 0 1 0 0 0  Down, Depressed, Hopeless 0 0 0 0 0  PHQ - 2 Score 0 1 0 0 0  Altered sleeping - - - - -  Tired, decreased energy - - - - -  Change in appetite - - - - -  Feeling bad or failure about yourself  - - - - -  Trouble concentrating - - - - -  Moving slowly or fidgety/restless - - - - -  Suicidal thoughts - - - - -  PHQ-9 Score - - - - -  Difficult doing work/chores - - - - -  Some recent data might be hidden    2. Essential hypertension Complaint with meds - Yes Current Medications - Norvasc, HCTZ Checking BP at home - No Exercising Regularly - No Watching Salt intake - Yes Pertinent ROS:  Headache - No Fatigue - No Visual Disturbances - No Chest pain - No Dyspnea - No Palpitations - Yes LE edema - No They report good compliance with medications and can restate  their regimen by memory. No medication side effects.  Family, social, and smoking history reviewed.   BP Readings from Last 3 Encounters:  06/12/19 123/80  03/05/19 135/87  11/21/18 (!) 146/75   CMP Latest Ref Rng & Units 03/05/2019 12/19/2017 11/27/2017  Glucose 65 - 99 mg/dL 97 97 87  BUN 6 - 24 mg/dL _2 Creatinine 0.76 - 1.27 mg/dL 1.13 1.00 0.95  Sodium 134 - 144 mmol/L 144 140 143  Potassium 3.5 - 5.2 mmol/L 3.6 3.7 4.0  Chloride 96 - 106 mmol/L 96 102 102  CO2 20 - 29 mmol/L _3 Calcium 8.7 - 10.2 mg/dL 9.9 9.1 9.5  Total Protein 6.0 - 8.5 g/dL 6.8 - -  Total Bilirubin 0.0 - 1.2 mg/dL 1.0 - -  Alkaline Phos 39 - 117 IU/L 119(H) - -  AST 0 - 40 IU/L 22 - -  ALT 0 - 44 IU/L 31 - -      3. Acquired hypothyroidism Compliant with medications - Yes Current medications - synthroid Adverse side effects - No Weight - stable  Bowel habit changes - No Heat or cold  intolerance - No Mood changes - No Changes in sleep habits - No Fatigue - No Skin, hair, or nail changes - No Tremor - No Palpitations - Yes Edema - No Shortness of breath - No  Lab Results  Component Value Date   TSH 2.200 03/05/2019     4. Gastroesophageal reflux disease without esophagitis Compliant with medications - Yes Current medications - pantoprazole Adverse side effects - No Cough - No Sore throat - No Voice change - No Hemoptysis - No Dysphagia or dyspepsia - No Water brash - No Red Flags (weight loss, hematochezia, melena, weight loss, early satiety, fevers, odynophagia, or persistent vomiting) - No   5. Mixed hyperlipidemia Compliant with medications - Yes Current medications - atorvastatin Side effects from medications - No Diet - generally healthy Exercise - none  Lab Results  Component Value Date   CHOL 141 03/05/2019   HDL 46 03/05/2019   LDLCALC 65 03/05/2019   TRIG 148 03/05/2019   CHOLHDL 3.1 03/05/2019     Family and personal medical history reviewed.  Smoking and ETOH history reviewed.    Relevant past medical, surgical, family, and social history reviewed and updated as indicated.  Allergies and medications reviewed and updated. Date reviewed: Chart in Epic.   Past Medical History:  Diagnosis Date   Allergy    Anxiety    Arthritis    CAD (coronary artery disease), native coronary artery 12/03/2015   Coronary artery disease    Dysrhythmia    "irregular and PAC'S"   GERD (gastroesophageal reflux disease)    Head injury with loss of consciousness ([place])    back in the 1980's   History of kidney stones    Hyperlipidemia    Hypertension    Hypothyroid    Multiple benign nevi    Neuromuscular disorder ([place]) 2011   LEFT RADIAL NERVE SURGERY R/T TRAUMA   Pneumonia 2014, 2017   Prostatism    Pulmonary nodule     Past Surgical History:  Procedure Laterality Date   BACK SURGERY     T12 - L1   CARDIAC CATHETERIZATION N/A 12/02/2015   Procedure: Left Heart Cath and Coronary Angiography;  Surgeon: Peter M Martinique, MD;  Location: [place] [facility];  Service: Cardiovascular;  Laterality: N/A;   CARDIAC CATHETERIZATION N/A 12/02/2015   Procedure: Coronary Stent Intervention;  Surgeon: Peter M Martinique, MD;  Location: [place] [facility];  Service: Cardiovascular;  Laterality: N/A;  mid LAD Promus 2.5x12   CORONARY ANGIOPLASTY     one stent placed by Dr. P Martinique   HARDWARE REMOVAL Right 08/26/2017   Procedure: Right Lumbar Five Revision of pedicle screw with Removal of Lumbar Five Screw;  Surgeon: Kristeen Miss, MD;  Location: [place];  Service: Neurosurgery;  Laterality: Right;  posterior   HARDWARE REMOVAL Right 12/27/2017   Procedure: Right Lumbar Two, Lumbar Three, Lumbar Four Pedicle screw removal with metrex;  Surgeon: Kristeen Miss, MD;  Location: [place];  Service: Neurosurgery;  Laterality: Right;  Right L2 to L4 Pedicle screw removal with mterex   MASS EXCISION  06/25/2012   Procedure: EXCISION MASS;  Surgeon:  Wynonia Sours, MD;  Location: [place];  Service: Orthopedics;  Laterality: Left;  transection of NEUROMA, BURYING RADIAL NERVE IN BRACHIORADIALIS LEFT SIDE   radial nerve Left    cut in work injury   SPINAL FUSION     C6-7   [place]  Social History   Socioeconomic History   Marital status: Married    Spouse name: Not on file   Number of children: 3   Years of education: Not on file   Highest education level: Not on file  Occupational History   Occupation: [employer] strain: Not on file   Food insecurity    Worry: Not on file    Inability: Not on file   Transportation needs    Medical: Not on file    Non-medical: Not on file  Tobacco Use   Smoking status: Former Smoker    Packs/day: 1.00    Years: 20.00    Pack years: 20.00    Types: Cigarettes    Quit date: 11/28/2006    Years since quitting: 12.5   Smokeless tobacco: Never Used  Substance and Sexual Activity   Alcohol use: Yes    Alcohol/week: 0.0 standard drinks    Comment: rarely   Drug use: No   Sexual activity: Yes  Lifestyle   Physical activity    Days per week: Not on file    Minutes per session: Not on file   Stress: Not on file  Relationships   Social connections    Talks on phone: Not on file    Gets together: Not on file    Attends religious service: Not on file    Active member of club or organization: Not on file    Attends meetings of clubs or organizations: Not on file    Relationship status: Not on file   Intimate partner violence    Fear of current or ex partner: Not on file    Emotionally abused: Not on file    Physically abused: Not on file    Forced sexual activity: Not on file  Other Topics Concern   Not on file  Social History Narrative   Lives at home with wife, new 41 year old adopted boy.      Outpatient Encounter Medications as of 06/12/2019  Medication Sig     acetaminophen (TYLENOL) 500 MG tablet Take 1,000 mg by mouth daily as needed for mild pain or headache.    amLODipine (NORVASC) 5 MG tablet Take 1.5 tablets (7.5 mg total) by mouth daily.   aspirin EC 81 MG tablet Take 81 mg by mouth at bedtime.   atorvastatin (LIPITOR) 80 MG tablet Take 1 tablet (80 mg total) by mouth at bedtime.   betamethasone dipropionate (DIPROLENE) 0.05 % cream APPLY TO AFFECTED AREA TWICE A DAY   diphenhydrAMINE (BENADRYL) 25 MG tablet Take 50 mg by mouth 2 (two) times daily as needed for itching.    EPINEPHrine 0.3 mg/0.3 mL IJ SOAJ injection Inject 0.3 mLs (0.3 mg total) into the muscle as needed for anaphylaxis.   fluticasone (FLONASE) 50 MCG/ACT nasal spray SPRAY 2 SPRAYS INTO EACH NOSTRIL EVERY DAY   HYDROcodone-acetaminophen (NORCO/VICODIN) 5-325 MG tablet Take 1 tablet by mouth every 6 (six) hours as needed. for pain   levocetirizine (XYZAL) 5 MG tablet Take 1 tablet (5 mg total) by mouth every evening.   levothyroxine (SYNTHROID) 100 MCG tablet Take 1 tablet (100 mcg total) by mouth daily.   nitroGLYCERIN (NITROSTAT) 0.4 MG SL tablet Place 1 tablet (0.4 mg total) under the tongue every 5 (five) minutes as needed for chest pain.   pantoprazole (PROTONIX) 40 MG tablet Take 1 tablet (40 mg total) by mouth daily.   triamcinolone (KENALOG)  0.025 % ointment Apply 1 application topically 2 (two) times daily. (Patient taking differently: Apply 1 application topically 2 (two) times daily as needed (rash). )   [DISCONTINUED] atorvastatin (LIPITOR) 80 MG tablet Take 1 tablet (80 mg total) by mouth at bedtime.   [DISCONTINUED] levothyroxine (SYNTHROID) 100 MCG tablet Take 1 tablet (100 mcg total) by mouth daily.   [DISCONTINUED] pantoprazole (PROTONIX) 40 MG tablet Take 1 tablet (40 mg total) by mouth daily.   [DISCONTINUED] PARoxetine (PAXIL) 10 MG tablet TAKE 1 TABLET BY MOUTH EVERY DAY   hydrochlorothiazide (HYDRODIURIL) 50 MG tablet Take 1 tablet (50 mg  total) by mouth daily.   PARoxetine (PAXIL) 20 MG tablet Take 1 tablet (20 mg total) by mouth daily.   [DISCONTINUED] hydrochlorothiazide (HYDRODIURIL) 50 MG tablet Take 1 tablet (50 mg total) by mouth daily.   No facility-administered encounter medications on file as of 06/12/2019.     Allergies  Allergen Reactions   Alpha-Gal Rash and Other (See Comments)    SEVERE ALLERGY TO ANY MEAT OR MEAT DERIVED PRODUCTS FROM 4 LEGGED ANIMALS > > BEEF, PORK , GOATS, DEER, ETC. < < RESULT OF BITE FROM LONE STAR TICK   Beef (Bovine) Protein Rash and Other (See Comments)   Beef-Derived Products Rash and Other (See Comments)   Lambs Quarters Rash and Other (See Comments)   Pork-Derived Products Rash and Other (See Comments)   Prednisone Other (See Comments)    im depo medrol caused dizziness - pt passed out. Ask pt before giving (pt has taken since the reaction with no issues)   Brilinta [Ticagrelor] Hives   Doxycycline Hives   Methylprednisolone Acetate     im depo medrol caused dizziness - pt passed out. Ask pt before giving    Penicillins Hives    Has patient had a PCN reaction causing immediate rash, facial/tongue/throat swelling, SOB or lightheadedness with hypotension: NO Pt states has has taken PCN after Vibramycin and not had any problems. Has patient had a PCN reaction causing severe rash involving mucus membranes or skin necrosis: No Has patient had a PCN reaction that required hospitalization No Has patient had a PCN reaction occurring within the last 10 years: No If all of the above answers are "NO", then may proceed with Cephalospo   Shellfish Allergy Hives   Adhesive [Tape] Rash   Fentanyl Rash    Reaction to adhesive, not the drug     Review of Systems  Constitutional: Negative for activity change, appetite change, chills, diaphoresis, fatigue, fever and unexpected weight change.  HENT: Negative.   Eyes: Negative.  Negative for photophobia and visual disturbance.   Respiratory: Negative for cough, chest tightness and shortness of breath.   Cardiovascular: Positive for palpitations. Negative for chest pain and leg swelling.  Gastrointestinal: Negative for abdominal distention, abdominal pain, anal bleeding, blood in stool, constipation, diarrhea, nausea, rectal pain and vomiting.  Endocrine: Negative.  Negative for cold intolerance, heat intolerance, polydipsia, polyphagia and polyuria.  Genitourinary: Negative for decreased urine volume, difficulty urinating, dysuria, frequency and urgency.  Musculoskeletal: Positive for arthralgias, back pain and gait problem (uses cane). Negative for myalgias.  Skin: Negative.  Negative for color change.  Allergic/Immunologic: Negative.   Neurological: Negative for dizziness, tremors, seizures, syncope, facial asymmetry, speech difficulty, weakness, light-headedness, numbness and headaches.  Hematological: Negative.  Does not bruise/bleed easily.  Psychiatric/Behavioral: Positive for agitation, decreased concentration, dysphoric mood and sleep disturbance. Negative for behavioral problems, confusion, hallucinations, self-injury and suicidal ideas. The patient is  nervous/anxious. The patient is not hyperactive.   All other systems reviewed and are negative.       Objective:  BP 123/80    Pulse (!) 58    Temp 99.5 F (37.5 C) (Temporal)    Resp 20    Ht _0  (1.778 m)    Wt 203 lb (92.1 kg)    SpO2 100%    BMI 29.13 kg/m    Wt Readings from Last 3 Encounters:  06/12/19 203 lb (92.1 kg)  03/05/19 207 lb (93.9 kg)  11/21/18 210 lb (95.3 kg)    Physical Exam Vitals signs and nursing note reviewed.  Constitutional:      General: He is not in acute distress.    Appearance: Normal appearance. He is well-developed, well-groomed and overweight. He is not ill-appearing, toxic-appearing or diaphoretic.  HENT:     Head: Normocephalic and atraumatic.     Jaw: There is normal jaw occlusion.     Right Ear: Hearing  normal.     Left Ear: Hearing normal.     Nose: Nose normal.     Mouth/Throat:     Lips: Pink.     Mouth: Mucous membranes are moist.     Pharynx: Oropharynx is clear. Uvula midline.  Eyes:     General: Lids are normal.     Extraocular Movements: Extraocular movements intact.     Conjunctiva/sclera: Conjunctivae normal.     Pupils: Pupils are equal, round, and reactive to light.  Neck:     Musculoskeletal: Normal range of motion and neck supple.     Thyroid: No thyroid mass, thyromegaly or thyroid tenderness.     Vascular: No carotid bruit or JVD.     Trachea: Trachea and phonation normal.  Cardiovascular:     Rate and Rhythm: Normal rate and regular rhythm.     Chest Wall: PMI is not displaced.     Pulses: Normal pulses.     Heart sounds: Normal heart sounds. No murmur. No friction rub. No gallop.   Pulmonary:     Effort: Pulmonary effort is normal. No respiratory distress.     Breath sounds: Normal breath sounds. No wheezing.  Abdominal:     General: Bowel sounds are normal. There is no distension or abdominal bruit.     Palpations: Abdomen is soft. There is no hepatomegaly or splenomegaly.     Tenderness: There is no abdominal tenderness. There is no right CVA tenderness or left CVA tenderness.     Hernia: No hernia is present.  Musculoskeletal:     Right hip: Normal.     Left hip: Normal.     Cervical back: Normal.     Thoracic back: He exhibits decreased range of motion, tenderness and pain. He exhibits no bony tenderness, no swelling, no edema, no deformity, no laceration, no spasm and normal pulse.     Lumbar back: He exhibits decreased range of motion, tenderness and pain. He exhibits no bony tenderness, no swelling, no edema, no deformity, no laceration, no spasm and normal pulse.     Right lower leg: No edema.     Left lower leg: No edema.  Lymphadenopathy:     Cervical: No cervical adenopathy.  Skin:    General: Skin is warm and dry.     Capillary Refill:  Capillary refill takes less than 2 seconds.     Coloration: Skin is not cyanotic, jaundiced or pale.     Findings: No rash.  Neurological:  General: No focal deficit present.     Mental Status: He is alert and oriented to person, place, and time.     Cranial Nerves: Cranial nerves are intact. No cranial nerve deficit.     Sensory: Sensation is intact.     Motor: Motor function is intact. No weakness.     Coordination: Coordination is intact.     Gait: Gait abnormal (antalgic gait, uses cane).     Deep Tendon Reflexes: Reflexes are normal and symmetric.  Psychiatric:        Attention and Perception: Attention and perception normal.        Mood and Affect: Affect normal. Mood is anxious.        Speech: Speech normal.        Behavior: Behavior normal. Behavior is cooperative.        Thought Content: Thought content normal.        Cognition and Memory: Cognition and memory normal.        Judgment: Judgment normal.     Results for orders placed or performed in visit on 03/05/19  CBC with Differential/Platelet  Result Value Ref Range   WBC 7.8 3.4 - 10.8 x10E3/uL   RBC 5.29 4.14 - 5.80 x10E6/uL   Hemoglobin 15.0 13.0 - 17.7 g/dL   Hematocrit 45.1 37.5 - 51.0 %   MCV 85 79 - 97 fL   MCH 28.4 26.6 - 33.0 pg   MCHC 33.3 31.5 - 35.7 g/dL   RDW 13.6 11.6 - 15.4 %   Platelets 251 150 - 450 x10E3/uL   Neutrophils 66 Not Estab. %   Lymphs 21 Not Estab. %   Monocytes 10 Not Estab. %   Eos 2 Not Estab. %   Basos 1 Not Estab. %   Neutrophils Absolute 5.2 1.4 - 7.0 x10E3/uL   Lymphocytes Absolute 1.6 0.7 - 3.1 x10E3/uL   Monocytes Absolute 0.8 0.1 - 0.9 x10E3/uL   EOS (ABSOLUTE) 0.2 0.0 - 0.4 x10E3/uL   Basophils Absolute 0.1 0.0 - 0.2 x10E3/uL   Immature Granulocytes 0 Not Estab. %   Immature Grans (Abs) 0.0 0.0 - 0.1 x10E3/uL  CMP14+EGFR  Result Value Ref Range   Glucose 97 65 - 99 mg/dL   BUN 21 6 - 24 mg/dL   Creatinine, Ser 1.13 0.76 - 1.27 mg/dL   GFR calc non Af Amer 72  >59 mL/min/1.73   GFR calc Af Amer 83 >59 mL/min/1.73   BUN/Creatinine Ratio 19 9 - 20   Sodium 144 134 - 144 mmol/L   Potassium 3.6 3.5 - 5.2 mmol/L   Chloride 96 96 - 106 mmol/L   CO2 26 20 - 29 mmol/L   Calcium 9.9 8.7 - 10.2 mg/dL   Total Protein 6.8 6.0 - 8.5 g/dL   Albumin 4.8 3.8 - 4.9 g/dL   Globulin, Total 2.0 1.5 - 4.5 g/dL   Albumin/Globulin Ratio 2.4 (H) 1.2 - 2.2   Bilirubin Total 1.0 0.0 - 1.2 mg/dL   Alkaline Phosphatase 119 (H) 39 - 117 IU/L   AST 22 0 - 40 IU/L   ALT 31 0 - 44 IU/L  Lipid panel  Result Value Ref Range   Cholesterol, Total 141 100 - 199 mg/dL   Triglycerides 148 0 - 149 mg/dL   HDL 46 >39 mg/dL   VLDL Cholesterol Cal 30 5 - 40 mg/dL   LDL Calculated 65 0 - 99 mg/dL   Chol/HDL Ratio 3.1 0.0 - 5.0 ratio  Thyroid Panel  With TSH  Result Value Ref Range   TSH 2.200 0.450 - 4.500 uIU/mL   T4, Total 13.2 (H) 4.5 - 12.0 ug/dL   T3 Uptake Ratio 29 24 - 39 %   Free Thyroxine Index 3.8 1.2 - 4.9  Microalbumin / creatinine urine ratio  Result Value Ref Range   Creatinine, Urine 349.0 Not Estab. mg/dL   Microalbumin, Urine 15.5 Not Estab. ug/mL   Microalb/Creat Ratio 4 0 - 29 mg/g creat  HIV Antibody (routine testing w rflx)  Result Value Ref Range   HIV Screen 4th Generation wRfx Reactive (A) Non Reactive  HIV 1/2 Ab Differentiation  Result Value Ref Range   HIV 1 Ab Negative Negative   HIV 2 Ab Negative Negative   NOTE (HIV CONF MULTIP Negative   RNA Qualitative  Result Value Ref Range   HIV 1 RNA Qualitative Negative Negative   Final Interpretation Comment        Pertinent labs & imaging results that were available during my care of the patient were reviewed by me and considered in my medical decision making.  Assessment & Plan:  Jeffrey Hooper was seen today for medical management of chronic issues.  Diagnoses and all orders for this visit:  Elevated alkaline phosphatase level Pt fasting today. Will check below.  -     CMP14+EGFR -      Alkaline Phosphatase, Isoenzymes  GAD (generalized anxiety disorder) Slight improvement since initiating paroxetine, will increase dose to 20 mg daily today. Report any new or worsening symptoms.  -     PARoxetine (PAXIL) 20 MG tablet; Take 1 tablet (20 mg total) by mouth daily.  Essential hypertension BP well controlled. Changes were not made in regimen today. Goal BP is 130/80. Pt aware to report any persistent high or low readings. DASH diet and exercise encouraged. Exercise at least 150 minutes per week and increase as tolerated. Goal BMI > 25. Stress management encouraged. Avoid nicotine and tobacco product use. Avoid excessive alcohol and NSAID's. Avoid more than 2000 mg of sodium daily. Medications as prescribed. Follow up as scheduled.  -     hydrochlorothiazide (HYDRODIURIL) 50 MG tablet; Take 1 tablet (50 mg total) by mouth daily. -     CMP14+EGFR  Acquired hypothyroidism Thyroid disease has been well controlled. Labs are pending. Adjustments to regimen will be made if warranted. Make sure to take medications on an empty stomach with a full glass of water. Make sure to avoid vitamins or supplements for at least 4 hours before and 4 hours after taking medications. Repeat labs in 3 months if adjustments are made and in 6 months if stable.   -     levothyroxine (SYNTHROID) 100 MCG tablet; Take 1 tablet (100 mcg total) by mouth daily.  Gastroesophageal reflux disease without esophagitis No red flags present. Diet discussed. Avoid fried, spicy, fatty, greasy, and acidic foods. Avoid caffeine, nicotine, and alcohol. Do not eat 2-3 hours before bedtime and stay upright for at least 1-2 hours after eating. Eat small frequent meals. Avoid NSAID's like motrin and aleve. Medications as prescribed. Report any new or worsening symptoms. Follow up as discussed or sooner if needed.   -     pantoprazole (PROTONIX) 40 MG tablet; Take 1 tablet (40 mg total) by mouth daily.  Mixed hyperlipidemia Diet  encouraged - increase intake of fresh fruits and vegetables, increase intake of lean proteins. Bake, broil, or grill foods. Avoid fried, greasy, and fatty foods. Avoid fast foods. Increase intake  of fiber-rich whole grains. Exercise encouraged - at least 150 minutes per week and advance as tolerated.  Goal BMI < 25. Continue medications as prescribed. Follow up in 3-6 months as discussed.  -     atorvastatin (LIPITOR) 80 MG tablet; Take 1 tablet (80 mg total) by mouth at bedtime.  Supraventricular tachycardia ([place]) Followed by cardiology, has upcoming appointment. Does have intermittent palpitations, no chest pain.     Continue all other maintenance medications.  Follow up plan: Return in about 3 months (around 09/12/2019), or if symptoms worsen or fail to improve, for GAD.  Continue healthy lifestyle choices, including diet (rich in fruits, vegetables, and lean proteins, and low in salt and simple carbohydrates) and exercise (at least 30 minutes of moderate physical activity daily).  Educational handout given for depression   The above assessment and management plan was discussed with the patient. The patient verbalized understanding of and has agreed to the management plan. Patient is aware to call the clinic if they develop any new symptoms or if symptoms persist or worsen. Patient is aware when to return to the clinic for a follow-up visit. Patient educated on when it is appropriate to go to the emergency department.   Monia Pouch, FNP-C [facility] [phone]

## 2019-06-12 NOTE — Patient Instructions (Signed)
If your symptoms worsen or you have thoughts of suicide/homicide, PLEASE SEEK IMMEDIATE MEDICAL ATTENTION.  You may always call the National Suicide Hotline.  This is available 24 hours a day, 7 days a week.  Their number is: 1-800-273-8255  Taking the medicine as directed and not missing any doses is one of the best things you can do to treat your depression.  Here are some things to keep in mind:  1) Side effects (stomach upset, some increased anxiety) may happen before you notice a benefit.  These side effects typically go away over time. 2) Changes to your dose of medicine or a change in medication all together is sometimes necessary 3) Most people need to be on medication at least 12 months 4) Many people will notice an improvement within two weeks but the full effect of the medication can take up to 4-6 weeks 5) Stopping the medication when you start feeling better often results in a return of symptoms 6) Never discontinue your medication without contacting a health care professional first.  Some medications require gradual discontinuation/ taper and can make you sick if you stop them abruptly.  If your symptoms worsen or you have thoughts of suicide/homicide, PLEASE SEEK IMMEDIATE MEDICAL ATTENTION.  You may always call:  National Suicide Hotline: 800-273-8255 [place] Crisis Line: 336-832-9700 Crisis Recovery in [place]: 800-939-5911   These are available 24 hours a day, 7 days a week.   

## 2019-06-16 LAB — CMP14+EGFR
ALT: 41 IU/L (ref 0–44)
AST: 28 IU/L (ref 0–40)
Albumin/Globulin Ratio: 2.1 (ref 1.2–2.2)
Albumin: 4.5 g/dL (ref 3.8–4.9)
Alkaline Phosphatase: 115 IU/L (ref 39–117)
BUN/Creatinine Ratio: 14 (ref 9–20)
BUN: 15 mg/dL (ref 6–24)
Bilirubin Total: 1 mg/dL (ref 0.0–1.2)
CO2: 26 mmol/L (ref 20–29)
Calcium: 9.7 mg/dL (ref 8.7–10.2)
Chloride: 99 mmol/L (ref 96–106)
Creatinine, Ser: 1.04 mg/dL (ref 0.76–1.27)
GFR calc Af Amer: 91 mL/min/{1.73_m2} (ref >59)
GFR calc non Af Amer: 79 mL/min/{1.73_m2} (ref >59)
Globulin, Total: 2.1 g/dL (ref 1.5–4.5)
Glucose: 93 mg/dL (ref 65–99)
Potassium: 3.6 mmol/L (ref 3.5–5.2)
Sodium: 142 mmol/L (ref 134–144)
Total Protein: 6.6 g/dL (ref 6.0–8.5)

## 2019-06-16 LAB — ALKALINE PHOSPHATASE, ISOENZYMES
BONE FRACTION: 25 % (ref 12–68)
INTESTINAL FRAC.: 0 % (ref 0–18)
LIVER FRACTION: 75 % (ref 13–88)

## 2019-07-09 ENCOUNTER — Ambulatory Visit (INDEPENDENT_AMBULATORY_CARE_PROVIDER_SITE_OTHER): Payer: BC Managed Care – PPO | Admitting: Family Medicine

## 2019-07-09 ENCOUNTER — Encounter: Payer: Self-pay | Admitting: Family Medicine

## 2019-07-09 DIAGNOSIS — Z20828 Contact with and (suspected) exposure to other viral communicable diseases: Secondary | ICD-10-CM

## 2019-07-09 DIAGNOSIS — Z7189 Other specified counseling: Secondary | ICD-10-CM

## 2019-07-09 DIAGNOSIS — R058 Other specified cough: Secondary | ICD-10-CM

## 2019-07-09 DIAGNOSIS — R05 Cough: Secondary | ICD-10-CM

## 2019-07-09 DIAGNOSIS — Z20822 Contact with and (suspected) exposure to covid-19: Secondary | ICD-10-CM

## 2019-07-09 MED ORDER — BENZONATATE 100 MG PO CAPS: 200.0000 mg | ORAL_CAPSULE | Freq: Three times a day (TID) | ORAL | 0 refills | Status: DC | PRN

## 2019-07-09 NOTE — Progress Notes (Signed)
Virtual Visit via telephone Note Due to COVID-19 pandemic this visit was conducted virtually. This visit type was conducted due to national recommendations for restrictions regarding the COVID-19 Pandemic (e.g. social distancing, sheltering in place) in an effort to limit this patient's exposure and mitigate transmission in our community. All issues noted in this document were discussed and addressed.  A physical exam was not performed with this format.   I connected with Jeffrey Hooper on 07/09/2019 at 1145 by telephone and verified that I am speaking with the correct person using two identifiers. Jeffrey Hooper is currently located at home and family is currently with them during visit. The provider, Monia Pouch, FNP is located in their office at time of visit.  I discussed the limitations, risks, security and privacy concerns of performing an evaluation and management service by telephone and the availability of in person appointments. I also discussed with the patient that there may be a patient responsible charge related to this service. The patient expressed understanding and agreed to proceed.  Subjective:  Patient ID: Jeffrey Hooper, male    DOB: [birth year], 58 y.o.   MRN: [id number]  Chief Complaint:  COVID-19 exposure   HPI: Jeffrey Hooper is a 58 y.o. male presenting on 07/09/2019 for COVID-19 exposure   Pt reports his wife tested positive for COVID-19 yesterday. Pt states he has developed cough, congestion, and malaise. Symptoms started last night. He denies fever, chills, or shortness of breath. No chest pain, abdominal pain, loss of taste or smell, nausea, vomiting, diarrhea, or rash. He has not tried anything for his symptoms. Nothing worsens or improves his symptoms.     Relevant past medical, surgical, family, and social history reviewed and updated as indicated.  Allergies and medications reviewed and updated.   Past Medical History:  Diagnosis Date  . Allergy   .  Anxiety   . Arthritis   . CAD (coronary artery disease), native coronary artery 12/03/2015  . Coronary artery disease   . Dysrhythmia    "irregular and PAC'S"  . GERD (gastroesophageal reflux disease)   . Head [place] with loss of consciousness [place]    back in the 1980's  . History of kidney stones   . Hyperlipidemia   . Hypertension   . Hypothyroid   . Multiple benign nevi   . Neuromuscular disorder ([place]) 2011   LEFT RADIAL NERVE SURGERY R/T TRAUMA  . Pneumonia 2014, 2017  . Prostatism   . Pulmonary nodule     Past Surgical History:  Procedure Laterality Date  . BACK SURGERY     T12 - L1  . CARDIAC CATHETERIZATION N/A 12/02/2015   Procedure: Left Heart Cath and Coronary Angiography;  Surgeon: Peter M Martinique, MD;  Location: [facility];  Service: Cardiovascular;  Laterality: N/A;  . CARDIAC CATHETERIZATION N/A 12/02/2015   Procedure: Coronary Stent Intervention;  Surgeon: Peter M Martinique, MD;  Location: [facility];  Service: Cardiovascular;  Laterality: N/A;  mid LAD Promus 2.5x12  . CORONARY ANGIOPLASTY     one stent placed by Dr. P Martinique  . HARDWARE REMOVAL Right 08/26/2017   Procedure: Right Lumbar Five Revision of pedicle screw with Removal of Lumbar Five Screw;  Surgeon: Kristeen Miss, MD;  Location: [facility];  Service: Neurosurgery;  Laterality: Right;  posterior  . HARDWARE REMOVAL Right 12/27/2017   Procedure: Right Lumbar Two, Lumbar Three, Lumbar Four Pedicle screw removal with metrex;  Surgeon: Kristeen Miss, MD;  Location:  [facility];  Service: Neurosurgery;  Laterality: Right;  Right L2 to L4 Pedicle screw removal with mterex  . MASS EXCISION  06/25/2012   Procedure: EXCISION MASS;  Surgeon: Wynonia Sours, MD;  Location: [facility];  Service: Orthopedics;  Laterality: Left;  transection of NEUROMA, BURYING RADIAL NERVE IN BRACHIORADIALIS LEFT SIDE  . radial nerve Left    cut in work injury  . SPINAL FUSION     C6-7  . TONSILLECTOMY  1982  .  VASECTOMY      Social History   Socioeconomic History  . Marital status: Married    Spouse name: Not on file  . Number of children: 3  . Years of education: Not on file  . Highest education level: Not on file  Occupational History  . Occupation: [employer] for [employer]  . Financial resource strain: Not on file  . Food insecurity    Worry: Not on file    Inability: Not on file  . Transportation needs    Medical: Not on file    Non-medical: Not on file  Tobacco Use  . Smoking status: Former Smoker    Packs/day: 1.00    Years: 20.00    Pack years: 20.00    Types: Cigarettes    Quit date: 11/28/2006    Years since quitting: 12.6  . Smokeless tobacco: Never Used  Substance and Sexual Activity  . Alcohol use: Yes    Alcohol/week: 0.0 standard drinks    Comment: rarely  . Drug use: No  . Sexual activity: Yes  Lifestyle  . Physical activity    Days per week: Not on file    Minutes per session: Not on file  . Stress: Not on file  Relationships  . Social Herbalist on phone: Not on file    Gets together: Not on file    Attends religious service: Not on file    Active member of club or organization: Not on file    Attends meetings of clubs or organizations: Not on file    Relationship status: Not on file  . Intimate partner violence    Fear of current or ex partner: Not on file    Emotionally abused: Not on file    Physically abused: Not on file    Forced sexual activity: Not on file  Other Topics Concern  . Not on file  Social History Narrative   Lives at home with wife, new 40 year old adopted boy.      Outpatient Encounter Medications as of 07/09/2019  Medication Sig  . acetaminophen (TYLENOL) 500 MG tablet Take 1,000 mg by mouth daily as needed for mild pain or headache.   [name] amLODipine (NORVASC) 5 MG tablet Take 1.5 tablets (7.5 mg total) by mouth daily.  [name] aspirin EC 81 MG tablet Take 81 mg by mouth at bedtime.  [name] atorvastatin  (LIPITOR) 80 MG tablet Take 1 tablet (80 mg total) by mouth at bedtime.  . benzonatate (TESSALON PERLES) 100 MG capsule Take 2 capsules (200 mg total) by mouth 3 (three) times daily as needed for cough.  . betamethasone dipropionate (DIPROLENE) 0.05 % cream APPLY TO AFFECTED AREA TWICE A DAY  . diphenhydrAMINE (BENADRYL) 25 MG tablet Take 50 mg by mouth 2 (two) times daily as needed for itching.   [name] EPINEPHrine 0.3 mg/0.3 mL IJ SOAJ injection Inject 0.3 mLs (0.3 mg total) into the muscle as needed for  anaphylaxis.  . fluticasone (FLONASE) 50 MCG/ACT nasal spray SPRAY 2 SPRAYS INTO EACH NOSTRIL EVERY DAY  . hydrochlorothiazide (HYDRODIURIL) 50 MG tablet Take 1 tablet (50 mg total) by mouth daily.  [name] HYDROcodone-acetaminophen (NORCO/VICODIN) 5-325 MG tablet Take 1 tablet by mouth every 6 (six) hours as needed. for pain  . levocetirizine (XYZAL) 5 MG tablet Take 1 tablet (5 mg total) by mouth every evening.  [name] levothyroxine (SYNTHROID) 100 MCG tablet Take 1 tablet (100 mcg total) by mouth daily.  . nitroGLYCERIN (NITROSTAT) 0.4 MG SL tablet Place 1 tablet (0.4 mg total) under the tongue every 5 (five) minutes as needed for chest pain.  . pantoprazole (PROTONIX) 40 MG tablet Take 1 tablet (40 mg total) by mouth daily.  [name] PARoxetine (PAXIL) 20 MG tablet Take 1 tablet (20 mg total) by mouth daily.  [name] triamcinolone (KENALOG) 0.025 % ointment Apply 1 application topically 2 (two) times daily. (Patient taking differently: Apply 1 application topically 2 (two) times daily as needed (rash). )   No facility-administered encounter medications on file as of 07/09/2019.     Allergies  Allergen Reactions  . Alpha-Gal Rash and Other (See Comments)    SEVERE ALLERGY TO ANY MEAT OR MEAT DERIVED PRODUCTS FROM 4 LEGGED ANIMALS > > BEEF, PORK , GOATS, DEER, ETC. < < RESULT OF BITE FROM LONE STAR TICK  . Beef (Bovine) Protein Rash and Other (See Comments)  . Beef-Derived Products Rash and Other (See Comments)  .  Lambs Quarters Rash and Other (See Comments)  . Pork-Derived Products Rash and Other (See Comments)  . Prednisone Other (See Comments)    im depo medrol caused dizziness - pt passed out. Ask pt before giving (pt has taken since the reaction with no issues)  . Brilinta [Ticagrelor] Hives  . Doxycycline Hives  . Methylprednisolone Sodium Succ     im depo medrol caused dizziness - pt passed out. Ask pt before giving   . Penicillins Hives    Has patient had a PCN reaction causing immediate rash, facial/tongue/throat swelling, SOB or lightheadedness with hypotension: NO Pt states has has taken PCN after Vibramycin and not had any problems. Has patient had a PCN reaction causing severe rash involving mucus membranes or skin necrosis: No Has patient had a PCN reaction that required hospitalization No Has patient had a PCN reaction occurring within the last 10 years: No If all of the above answers are "NO", then may proceed with Cephalospo  . Shellfish Allergy Hives  . Adhesive [Tape] Rash  . Fentanyl Rash    Reaction to adhesive, not the drug     Review of Systems  Constitutional: Positive for fatigue. Negative for activity change, appetite change, chills, diaphoresis, fever and unexpected weight change.  HENT: Positive for congestion and postnasal drip. Negative for dental problem, drooling, ear discharge, ear pain, facial swelling, hearing loss, mouth sores, nosebleeds, rhinorrhea, sinus pressure, sinus pain, sneezing, sore throat, tinnitus, trouble swallowing and voice change.   Eyes: Negative.  Negative for visual disturbance.  Respiratory: Positive for cough. Negative for chest tightness and shortness of breath.   Cardiovascular: Negative for chest pain, palpitations and leg swelling.  Gastrointestinal: Negative for abdominal pain, blood in stool, constipation, diarrhea, nausea and vomiting.  Endocrine: Negative.   Genitourinary: Negative for decreased urine volume, difficulty urinating,  dysuria, frequency and urgency.  Musculoskeletal: Negative for arthralgias and myalgias.  Skin: Negative.  Negative for color change and rash.  Allergic/Immunologic: Negative.   Neurological: Negative for  dizziness, tremors, seizures, syncope, facial asymmetry, speech difficulty, weakness, light-headedness, numbness and headaches.  Hematological: Negative.   Psychiatric/Behavioral: Negative for confusion, hallucinations, sleep disturbance and suicidal ideas.  All other systems reviewed and are negative.        Observations/Objective: No vital signs or physical exam, this was a telephone or virtual health encounter.  Pt alert and oriented, answers all questions appropriately, and able to speak in full sentences.    Assessment and Plan: Eryn was seen today for covid-19 exposure.  Diagnoses and all orders for this visit:  Cough with exposure to COVID-19 virus Suspected COVID-19 virus infection Close exposure to positive COVID-19 pt with development of symptoms. Suspected COVID-19 infection. Symptomatic care discussed in detail. Pt aware of symptoms that require emergent evaluation and treatment. Will trial below for cough.  -     benzonatate (TESSALON PERLES) 100 MG capsule; Take 2 capsules (200 mg total) by mouth 3 (three) times daily as needed for cough.  Educated about COVID-19 virus infection Following information discussed and sent to pts MyChart.  This is a viral infection that cannot be treated with antibiotics. (Antibiotics are for bacterial infections, not viral infections.)  Viruses usually causes high fever during the first 3 days, followed by a gradual decrease in fever over the next 3-4 days. This infection will resolve through the body's defenses. Understand that fever is one of the body's primary defense mechanisms; an increased core body temperature (a fever) helps to kill germs. Symptomatic care at home should include the following:    Get plenty of rest.   Drink  plenty of fluids (water, gatorade, or pedialyte), popsicles, and ice cream.    Take acetaminophen (Tylenol) or ibuprofen (Advil, Motrin) for fever or pain as needed.    For fever, you can also put a cool rag on the head and drink cold drinks.  A warm (not hot) bath will also be helpful.    Take honey for sore throat or to soothe an irritant cough.   Avoid spicy or acidic foods to minimize further throat irritation. For the same reason, avoid juices with citric acid.   Contact and droplet isolation for 5 days. Wash hands very well.  Wipe down all surfaces with sanitizer wipes at least once a day. Do not share cups or utensils.    Expect resolution in about 10 days.  If her fever increases after the 3rd day, instead of decreases, please return to the office.  If her voice starts to change and becomes very nasal sounding, then please return to the office.   Reported symptoms concerning for COVID-19 infection. Testing not recommended as spouse is positive and pt is likely positive. Symptomatic care discussed. Adequate hydration and rest. Tylenol as needed for fever and pain control. Over the counter decongestants and cough suppressants. Report any new, worsening, or persistent symptoms. Pt aware of self quarantine and infection prevention guidelines. Aware to shelter in place until symptom free without medications for at least 72 hours. Medications as discussed. Pt aware of symptoms that require emergent evaluation and treatment. Follow up as needed.      Follow Up Instructions: Return if symptoms worsen or fail to improve.    I discussed the assessment and treatment plan with the patient. The patient was provided an opportunity to ask questions and all were answered. The patient agreed with the plan and demonstrated an understanding of the instructions.   The patient was advised to call back or seek an in-person evaluation if  the symptoms worsen or if the condition fails to improve as  anticipated.  The above assessment and management plan was discussed with the patient. The patient verbalized understanding of and has agreed to the management plan. Patient is aware to call the clinic if they develop any new symptoms or if symptoms persist or worsen. Patient is aware when to return to the clinic for a follow-up visit. Patient educated on when it is appropriate to go to the emergency department.    I provided 15 minutes of non-face-to-face time during this encounter. The call started at 1145. The call ended at 1200. The other time was used for coordination of care.    Monia Pouch, FNP-C [facility] [address] [place], [place] [address] [phone] 07/09/2019

## 2019-07-28 ENCOUNTER — Other Ambulatory Visit: Payer: Self-pay

## 2019-07-29 ENCOUNTER — Ambulatory Visit (INDEPENDENT_AMBULATORY_CARE_PROVIDER_SITE_OTHER): Payer: BC Managed Care – PPO | Admitting: Family Medicine

## 2019-07-29 ENCOUNTER — Encounter: Payer: Self-pay | Admitting: Family Medicine

## 2019-07-29 VITALS — BP 120/68 | HR 59 | Temp 98.9°F | Resp 20 | Ht 70.0 in | Wt 198.0 lb

## 2019-07-29 DIAGNOSIS — S90211A Contusion of right great toe with damage to nail, initial encounter: Secondary | ICD-10-CM | POA: Diagnosis not present

## 2019-07-29 NOTE — Progress Notes (Signed)
Subjective:  Patient ID: Jeffrey Hooper, male    DOB: August 25, 1960, 58 y.o.   MRN: [id number]  Patient Care Team: Baruch Gouty, FNP as PCP - General (Family Medicine) Minus Breeding, MD as PCP - Cardiology (Cardiology)   Chief Complaint:  right great toe pain   HPI: Jeffrey Hooper is a 58 y.o. male presenting on 07/29/2019 for right great toe pain   Pt presents today with recurrent right great toe subungual hematoma. States symptoms started several days ago and are not improving. Pt denies known injury. States this has happened in the past and the nail fell off on its own. States this time the toe is starting to ache due to the pressure. He has tried soaking the toe without relief of symptoms. No deformity or erythema. No swelling or drainage. States 4/10 at worst and aching in nature.    Relevant past medical, surgical, family, and social history reviewed and updated as indicated.  Allergies and medications reviewed and updated. Date reviewed: Chart in Epic.   Past Medical History:  Diagnosis Date  . Allergy   . Anxiety   . Arthritis   . CAD (coronary artery disease), native coronary artery 12/03/2015  . Coronary artery disease   . Dysrhythmia    "irregular and PAC'S"  . GERD (gastroesophageal reflux disease)   . Head injury with loss of consciousness ([place])    back in the 1980's  . History of kidney stones   . Hyperlipidemia   . Hypertension   . Hypothyroid   . Multiple benign nevi   . Neuromuscular disorder ([place]) 2011   LEFT RADIAL NERVE SURGERY R/T TRAUMA  . Pneumonia 2014, 2017  . Prostatism   . Pulmonary nodule     Past Surgical History:  Procedure Laterality Date  . BACK SURGERY     T12 - L1  . CARDIAC CATHETERIZATION N/A 12/02/2015   Procedure: Left Heart Cath and Coronary Angiography;  Surgeon: Peter M Martinique, MD;  Location: [facility];  Service: Cardiovascular;  Laterality: N/A;  . CARDIAC CATHETERIZATION N/A 12/02/2015   Procedure: Coronary  Stent Intervention;  Surgeon: Peter M Martinique, MD;  Location: [facility];  Service: Cardiovascular;  Laterality: N/A;  mid LAD Promus 2.5x12  . CORONARY ANGIOPLASTY     one stent placed by Dr. P Martinique  . HARDWARE REMOVAL Right 08/26/2017   Procedure: Right Lumbar Five Revision of pedicle screw with Removal of Lumbar Five Screw;  Surgeon: Kristeen Miss, MD;  Location: [facility];  Service: Neurosurgery;  Laterality: Right;  posterior  . HARDWARE REMOVAL Right 12/27/2017   Procedure: Right Lumbar Two, Lumbar Three, Lumbar Four Pedicle screw removal with metrex;  Surgeon: Kristeen Miss, MD;  Location: [facility];  Service: Neurosurgery;  Laterality: Right;  Right L2 to L4 Pedicle screw removal with mterex  . MASS EXCISION  06/25/2012   Procedure: EXCISION MASS;  Surgeon: Wynonia Sours, MD;  Location: [facility];  Service: Orthopedics;  Laterality: Left;  transection of NEUROMA, BURYING RADIAL NERVE IN BRACHIORADIALIS LEFT SIDE  . radial nerve Left    cut in work injury  . SPINAL FUSION     C6-7  . TONSILLECTOMY  1982  . VASECTOMY      Social History   Socioeconomic History  . Marital status: Married    Spouse name: Not on file  . Number of children: 3  . Years of education: Not on file  .  Highest education level: Not on file  Occupational History  . Occupation: [employer]  Tobacco Use  . Smoking status: Former Smoker    Packs/day: 1.00    Years: 20.00    Pack years: 20.00    Types: Cigarettes    Quit date: 11/28/2006    Years since quitting: 12.6  . Smokeless tobacco: Never Used  Substance and Sexual Activity  . Alcohol use: Yes    Alcohol/week: 0.0 standard drinks    Comment: rarely  . Drug use: No  . Sexual activity: Yes  Other Topics Concern  . Not on file  Social History Narrative   Lives at home with wife, new 44 year old adopted boy.     Social Determinants of Health   Financial Resource Strain:   . Difficulty of Paying Living Expenses:  Not on file  Food Insecurity:   . Worried About Charity fundraiser in the Last Year: Not on file  . Ran Out of Food in the Last Year: Not on file  Transportation Needs:   . Lack of Transportation (Medical): Not on file  . Lack of Transportation (Non-Medical): Not on file  Physical Activity:   . Days of Exercise per Week: Not on file  . Minutes of Exercise per Session: Not on file  Stress:   . Feeling of Stress : Not on file  Social Connections:   . Frequency of Communication with Friends and Family: Not on file  . Frequency of Social Gatherings with Friends and Family: Not on file  . Attends Religious Services: Not on file  . Active Member of Clubs or Organizations: Not on file  . Attends Archivist Meetings: Not on file  . Marital Status: Not on file  Intimate Partner Violence:   . Fear of Current or Ex-Partner: Not on file  . Emotionally Abused: Not on file  . Physically Abused: Not on file  . Sexually Abused: Not on file    Outpatient Encounter Medications as of 07/29/2019  Medication Sig  . acetaminophen (TYLENOL) 500 MG tablet Take 1,000 mg by mouth daily as needed for mild pain or headache.   [name] amLODipine (NORVASC) 5 MG tablet Take 1.5 tablets (7.5 mg total) by mouth daily.  [name] aspirin EC 81 MG tablet Take 81 mg by mouth at bedtime.  [name] atorvastatin (LIPITOR) 80 MG tablet Take 1 tablet (80 mg total) by mouth at bedtime.  . betamethasone dipropionate (DIPROLENE) 0.05 % cream APPLY TO AFFECTED AREA TWICE A DAY  . diphenhydrAMINE (BENADRYL) 25 MG tablet Take 50 mg by mouth 2 (two) times daily as needed for itching.   . fluticasone (FLONASE) 50 MCG/ACT nasal spray SPRAY 2 SPRAYS INTO EACH NOSTRIL EVERY DAY  . hydrochlorothiazide (HYDRODIURIL) 50 MG tablet Take 1 tablet (50 mg total) by mouth daily.  [name] HYDROcodone-acetaminophen (NORCO/VICODIN) 5-325 MG tablet Take 1 tablet by mouth every 6 (six) hours as needed. for pain  . levocetirizine (XYZAL) 5 MG tablet Take 1  tablet (5 mg total) by mouth every evening.  [name] levothyroxine (SYNTHROID) 100 MCG tablet Take 1 tablet (100 mcg total) by mouth daily.  . pantoprazole (PROTONIX) 40 MG tablet Take 1 tablet (40 mg total) by mouth daily.  [name] PARoxetine (PAXIL) 20 MG tablet Take 1 tablet (20 mg total) by mouth daily.  [name] triamcinolone (KENALOG) 0.025 % ointment Apply 1 application topically 2 (two) times daily. (Patient taking differently: Apply 1 application topically 2 (two) times  daily as needed (rash). )  . EPINEPHrine 0.3 mg/0.3 mL IJ SOAJ injection Inject 0.3 mLs (0.3 mg total) into the muscle as needed for anaphylaxis. (Patient not taking: Reported on 07/29/2019)  . nitroGLYCERIN (NITROSTAT) 0.4 MG SL tablet Place 1 tablet (0.4 mg total) under the tongue every 5 (five) minutes as needed for chest pain. (Patient not taking: Reported on 07/29/2019)  . [DISCONTINUED] benzonatate (TESSALON PERLES) 100 MG capsule Take 2 capsules (200 mg total) by mouth 3 (three) times daily as needed for cough.   No facility-administered encounter medications on file as of 07/29/2019.    Allergies  Allergen Reactions  . Alpha-Gal Rash and Other (See Comments)    SEVERE ALLERGY TO ANY MEAT OR MEAT DERIVED PRODUCTS FROM 4 LEGGED ANIMALS > > BEEF, PORK , GOATS, DEER, ETC. < < RESULT OF BITE FROM LONE STAR TICK  . Beef (Bovine) Protein Rash and Other (See Comments)  . Beef-Derived Products Rash and Other (See Comments)  . Lambs Quarters Rash and Other (See Comments)  . Pork-Derived Products Rash and Other (See Comments)  . Prednisone Other (See Comments)    im depo medrol caused dizziness - pt passed out. Ask pt before giving (pt has taken since the reaction with no issues)  . Brilinta [Ticagrelor] Hives  . Doxycycline Hives  . Methylprednisolone Sodium Succ     im depo medrol caused dizziness - pt passed out. Ask pt before giving   . Penicillins Hives    Has patient had a PCN reaction causing immediate rash,  facial/tongue/throat swelling, SOB or lightheadedness with hypotension: NO Pt states has has taken PCN after Vibramycin and not had any problems. Has patient had a PCN reaction causing severe rash involving mucus membranes or skin necrosis: No Has patient had a PCN reaction that required hospitalization No Has patient had a PCN reaction occurring within the last 10 years: No If all of the above answers are "NO", then may proceed with Cephalospo  . Shellfish Allergy Hives  . Adhesive [Tape] Rash  . Fentanyl Rash    Reaction to adhesive, not the drug     Review of Systems  Constitutional: Negative for activity change, appetite change, chills, fatigue and fever.  HENT: Negative.   Eyes: Negative.   Respiratory: Negative for cough, chest tightness and shortness of breath.   Cardiovascular: Negative for chest pain, palpitations and leg swelling.  Gastrointestinal: Negative for blood in stool, constipation, diarrhea, nausea and vomiting.  Endocrine: Negative.   Genitourinary: Negative for dysuria, frequency and urgency.  Musculoskeletal: Positive for arthralgias, back pain, gait problem (uses cane) and myalgias.  Skin: Positive for color change (hematoma under right great toenail).  Allergic/Immunologic: Negative.   Neurological: Negative for dizziness and headaches.  Hematological: Negative.   Psychiatric/Behavioral: Negative for confusion, hallucinations, sleep disturbance and suicidal ideas.  All other systems reviewed and are negative.       Objective:  BP 120/68   Pulse (!) 59   Temp 98.9 F (37.2 C)   Resp 20   Ht 5' 10"  (1.778 m)   Wt 198 lb (89.8 kg)   SpO2 99%   BMI 28.41 kg/m    Wt Readings from Last 3 Encounters:  07/29/19 198 lb (89.8 kg)  06/12/19 203 lb (92.1 kg)  03/05/19 207 lb (93.9 kg)    Physical Exam Vitals and nursing note reviewed.  Constitutional:      General: He is not in acute distress.    Appearance: Normal appearance. He  is well-developed  and well-groomed. He is not ill-appearing, toxic-appearing or diaphoretic.  HENT:     Head: Normocephalic and atraumatic.     Jaw: There is normal jaw occlusion.     Right Ear: Hearing normal.     Left Ear: Hearing normal.     Nose: Nose normal.     Mouth/Throat:     Lips: Pink.     Mouth: Mucous membranes are moist.     Pharynx: Oropharynx is clear. Uvula midline.  Eyes:     General: Lids are normal.     Extraocular Movements: Extraocular movements intact.     Conjunctiva/sclera: Conjunctivae normal.     Pupils: Pupils are equal, round, and reactive to light.  Neck:     Thyroid: No thyroid mass, thyromegaly or thyroid tenderness.     Vascular: No carotid bruit or JVD.     Trachea: Trachea and phonation normal.  Cardiovascular:     Rate and Rhythm: Normal rate and regular rhythm.     Chest Wall: PMI is not displaced.     Pulses: Normal pulses.          Dorsalis pedis pulses are 2+ on the right side and 2+ on the left side.       Posterior tibial pulses are 2+ on the right side and 2+ on the left side.     Heart sounds: Normal heart sounds. No murmur. No friction rub. No gallop.   Pulmonary:     Effort: Pulmonary effort is normal. No respiratory distress.     Breath sounds: Normal breath sounds. No wheezing.  Abdominal:     General: Bowel sounds are normal. There is no distension or abdominal bruit.     Palpations: Abdomen is soft. There is no hepatomegaly or splenomegaly.     Tenderness: There is no abdominal tenderness. There is no right CVA tenderness or left CVA tenderness.     Hernia: No hernia is present.  Musculoskeletal:     Cervical back: Normal range of motion and neck supple.     Right lower leg: No edema.     Left lower leg: No edema.       Feet:  Lymphadenopathy:     Cervical: No cervical adenopathy.  Skin:    General: Skin is warm and dry.     Capillary Refill: Capillary refill takes less than 2 seconds.     Coloration: Skin is not cyanotic, jaundiced or  pale.     Findings: No rash.  Neurological:     General: No focal deficit present.     Mental Status: He is alert and oriented to person, place, and time.     Cranial Nerves: Cranial nerves are intact.     Sensory: Sensation is intact.     Motor: Motor function is intact.     Coordination: Coordination is intact.     Gait: Gait abnormal (uses cane).     Deep Tendon Reflexes: Reflexes are normal and symmetric.  Psychiatric:        Attention and Perception: Attention and perception normal.        Mood and Affect: Mood and affect normal.        Speech: Speech normal.        Behavior: Behavior normal. Behavior is cooperative.        Thought Content: Thought content normal.        Cognition and Memory: Cognition and memory normal.        Judgment:  Judgment normal.     Results for orders placed or performed in visit on 06/12/19  CMP14+EGFR  Result Value Ref Range   Glucose 93 65 - 99 mg/dL   BUN 15 6 - 24 mg/dL   Creatinine, Ser 1.04 0.76 - 1.27 mg/dL   GFR calc non Af Amer 79 >59 mL/min/1.73   GFR calc Af Amer 91 >59 mL/min/1.73   BUN/Creatinine Ratio 14 9 - 20   Sodium 142 134 - 144 mmol/L   Potassium 3.6 3.5 - 5.2 mmol/L   Chloride 99 96 - 106 mmol/L   CO2 26 20 - 29 mmol/L   Calcium 9.7 8.7 - 10.2 mg/dL   Total Protein 6.6 6.0 - 8.5 g/dL   Albumin 4.5 3.8 - 4.9 g/dL   Globulin, Total 2.1 1.5 - 4.5 g/dL   Albumin/Globulin Ratio 2.1 1.2 - 2.2   Bilirubin Total 1.0 0.0 - 1.2 mg/dL   Alkaline Phosphatase 115 39 - 117 IU/L   AST 28 0 - 40 IU/L   ALT 41 0 - 44 IU/L  Alkaline Phosphatase, Isoenzymes  Result Value Ref Range   LIVER FRACTION 75 13 - 88 %   BONE FRACTION 25 12 - 68 %   INTESTINAL FRAC. 0 0 - 18 %     Attempted subungual hematoma drainage via trephination with disposable electric cautery without success. Three holes placed with minimal return of blood. Pt tolerated procedure well.   Pertinent labs & imaging results that were available during my care of the  patient were reviewed by me and considered in my medical decision making.  Assessment & Plan:  Kmarion was seen today for right great toe pain.  Diagnoses and all orders for this visit:  Subungual hematoma of great toe of right foot, initial encounter Attempted trephination in office without relief of hematoma. Symptomatic care discussed in detail. Due to recurrent nature will refer to podiatry for evaluation and treatment.  -     Ambulatory referral to Podiatry     Continue all other maintenance medications.  Follow up plan: Return if symptoms worsen or fail to improve.  Continue healthy lifestyle choices, including diet (rich in fruits, vegetables, and lean proteins, and low in salt and simple carbohydrates) and exercise (at least 30 minutes of moderate physical activity daily).  Educational handout given for subungual hematoma  The above assessment and management plan was discussed with the patient. The patient verbalized understanding of and has agreed to the management plan. Patient is aware to call the clinic if they develop any new symptoms or if symptoms persist or worsen. Patient is aware when to return to the clinic for a follow-up visit. Patient educated on when it is appropriate to go to the emergency department.   Monia Pouch, FNP-C [facility] [phone]

## 2019-07-29 NOTE — Patient Instructions (Signed)
Subungual Hematoma A subungual hematoma is a collection of blood under a fingernail or toenail. It can cause pain and a blue area under the nail. What are the causes? This condition is caused by an injury to a finger or toe that breaks a blood vessel under the nail. It can result from:  A hard, direct hit to a finger or toe (crush injury).  Pressure being put on a finger or toe over and over again, such as pressure on a toe from running. What are the signs or symptoms?   A blue or dark blue color under the nail.  Pain or throbbing in the injured area. How is this treated? Treatment is often not needed for this condition. The pain often goes away in a few days, and the dark color under the nail will go away as the nail grows. If treatment is needed, your doctor may:  Do a procedure to drain the blood from under the nail. This may be done if you have a lot of pain or if a lot of blood collects under the nail.  Remove the nail. This may be done if there is a cut under the nail that needs stitches (sutures). Follow these instructions at home: Managing pain, stiffness, and swelling   If told, put ice on the area. ? Put ice in a plastic bag. ? Place a towel between your skin and the bag. ? Leave the ice on for 20 minutes, 2-3 times a day.  Raise (elevate) the injured finger or toe above the level of your heart while you are sitting or lying down. Doing this will help with pain and swelling. Injury care  Follow instructions from your doctor about how to take care of your injury. Make sure you: ? Change any bandage (dressing) as told by your doctor. ? Wash your hands with soap and water before you change your bandage. If you cannot use soap and water, use hand sanitizer. ? Leave stitches (sutures) in place. You may have these if your doctor fixed a cut under the nail. The stitches may need to stay in place for 2 weeks or longer.  If part of your nail falls off, gently trim the rest of  the nail. General instructions  Take over-the-counter and prescription medicines only as told by your doctor.  Return to your normal activities as told by your doctor. Ask your doctor what activities are safe for you.  Keep all follow-up visits as told by your doctor. This is important. Contact a doctor if you have:  Pain that is not helped by medicine.  A fever.  Redness, swelling, or pain around your nail. Get help right away if you have:  Fluid, blood, or pus coming from your nail. Summary  A subungual hematoma is a collection of blood under a fingernail or toenail.  It can cause pain and a blue area under the nail.  Treatment is often not needed for this condition.  Raise the injured finger or toe above the level of your heart while you are sitting or lying down. This information is not intended to replace advice given to you by your health care provider. Make sure you discuss any questions you have with your health care provider. Document Released: 10/15/2011 Document Revised: 12/26/2017 Document Reviewed: 12/26/2017 Elsevier Patient Education  2020 [employer].

## 2019-08-11 DIAGNOSIS — S90211A Contusion of right great toe with damage to nail, initial encounter: Secondary | ICD-10-CM | POA: Diagnosis not present

## 2019-08-11 DIAGNOSIS — M79674 Pain in right toe(s): Secondary | ICD-10-CM | POA: Diagnosis not present

## 2019-08-25 DIAGNOSIS — S90211D Contusion of right great toe with damage to nail, subsequent encounter: Secondary | ICD-10-CM | POA: Diagnosis not present

## 2019-09-10 ENCOUNTER — Telehealth: Payer: Self-pay | Admitting: Cardiology

## 2019-09-10 DIAGNOSIS — R001 Bradycardia, unspecified: Secondary | ICD-10-CM | POA: Insufficient documentation

## 2019-09-10 DIAGNOSIS — Z7189 Other specified counseling: Secondary | ICD-10-CM | POA: Insufficient documentation

## 2019-09-10 NOTE — Telephone Encounter (Signed)
Spoke with pt who report intermittent chest pain for the past week. Pt denies active pain at the moment but report having to take nitro 4-5 times over the past week.  He describes symptom as a stabbing pain that last for a few seconds. He denies any specific triggers or aggravating factors.   Appointment scheduled for tomorrow 2/5. Pt voiced understanding to report to ER if symptoms reoccur between now and appointment time.

## 2019-09-10 NOTE — Telephone Encounter (Signed)
Pt c/o of Chest Pain: STAT if CP now or developed within 24 hours  1. Are you having CP right now? No  2. Are you experiencing any other symptoms (ex. SOB, nausea, vomiting, sweating)? No  3. How long have you been experiencing CP? 1 week  4. Is your CP continuous or coming and going? Coming and going  5. Have you taken Nitroglycerin? No ?

## 2019-09-10 NOTE — Progress Notes (Signed)
Cardiology Office Note   Date:  09/11/2019   ID:  KAIDIN RUSHLOW, DOB 11-Jan-1961, MRN [id number]  PCP:  Baruch Gouty, FNP  Cardiologist:   Minus Breeding, MD   No chief complaint on file.     History of Present Illness: Jeffrey Hooper is a 59 y.o. male who presents follow up of CAD.  Stress perfusion study in 2017 suggested a moderate sized defect in the inferior wall with ischemia. The EF was well-preserved.  The patient presented to [facility] on 12/02/15 for cath and was found to have single vessel obstructive CAD involving the mid LAD. He underwent successful PCI with a mid LAD DES.   He called yesterday and he reported chest pain.  He was added to my schedule today.  He called because he was having chest discomfort on and off for 2 weeks.  He is actually taken a couple of sublingual nitroglycerin.  It happened four times in the last 2 weeks.  He describes it as a spike in his chest.  Typically it was feeling like a finger in his chest but it got more intense.  However, it only seems to last for a few seconds.  He cannot bring it on with activity although he is limited by back pain walks with a cane sometimes.  He just had a back stimulator put in.  He does have nausea occasionally out of the blue but this is not related to the chest pain.  He does not describe jaw or arm discomfort.  He does not think it was like previous angina.  He does have palpitations as well.  These are on and off.  They were bothering him a while back and then did not seem to but have returned.  They last for 20 to 30 seconds.  They are every day.  He feels his heart beating fast.  Its not particularly irregular.  It stops and starts spontaneously.  He has not had associated presyncope or syncope.  Since I last saw him he has done well.  The patient denies any new symptoms such as chest discomfort, neck or arm discomfort. There has been no new shortness of breath, PND or orthopnea. There has been  presyncope or  syncope.  He still feels palpitations.  However, these are fleeting.  They last for only a few minutes at a time.   Past Medical History:  Diagnosis Date  . Allergy   . Anxiety   . Arthritis   . CAD (coronary artery disease), native coronary artery 12/03/2015  . Coronary artery disease   . Dysrhythmia    "irregular and PAC'S"  . GERD (gastroesophageal reflux disease)   . Head injury with loss of consciousness ([place])    back in the 1980's  . History of kidney stones   . Hyperlipidemia   . Hypertension   . Hypothyroid   . Multiple benign nevi   . Neuromuscular disorder ([place]) 2011   LEFT RADIAL NERVE SURGERY R/T TRAUMA  . Pneumonia 2014, 2017  . Prostatism   . Pulmonary nodule     Past Surgical History:  Procedure Laterality Date  . BACK SURGERY     T12 - L1  . CARDIAC CATHETERIZATION N/A 12/02/2015   Procedure: Left Heart Cath and Coronary Angiography;  Surgeon: Peter M Martinique, MD;  Location: [facility];  Service: Cardiovascular;  Laterality: N/A;  . CARDIAC CATHETERIZATION N/A 12/02/2015   Procedure: Coronary Stent Intervention;  Surgeon: Ander Slade  Martinique, MD;  Location: [facility];  Service: Cardiovascular;  Laterality: N/A;  mid LAD Promus 2.5x12  . CORONARY ANGIOPLASTY     one stent placed by Dr. P Martinique  . HARDWARE REMOVAL Right 08/26/2017   Procedure: Right Lumbar Five Revision of pedicle screw with Removal of Lumbar Five Screw;  Surgeon: Kristeen Miss, MD;  Location: [facility];  Service: Neurosurgery;  Laterality: Right;  posterior  . HARDWARE REMOVAL Right 12/27/2017   Procedure: Right Lumbar Two, Lumbar Three, Lumbar Four Pedicle screw removal with metrex;  Surgeon: Kristeen Miss, MD;  Location: [facility];  Service: Neurosurgery;  Laterality: Right;  Right L2 to L4 Pedicle screw removal with mterex  . MASS EXCISION  06/25/2012   Procedure: EXCISION MASS;  Surgeon: Wynonia Sours, MD;  Location: [facility];  Service: Orthopedics;  Laterality: Left;   transection of NEUROMA, BURYING RADIAL NERVE IN BRACHIORADIALIS LEFT SIDE  . radial nerve Left    cut in work injury  . SPINAL FUSION     C6-7  . TONSILLECTOMY  1982  . VASECTOMY       Current Outpatient Medications  Medication Sig Dispense Refill  . acetaminophen (TYLENOL) 500 MG tablet Take 1,000 mg by mouth daily as needed for mild pain or headache.     [name] amLODipine (NORVASC) 5 MG tablet Take 1.5 tablets (7.5 mg total) by mouth daily. 135 tablet 3  . aspirin EC 81 MG tablet Take 81 mg by mouth at bedtime.    [name] atorvastatin (LIPITOR) 80 MG tablet Take 1 tablet (80 mg total) by mouth at bedtime. 90 tablet 2  . betamethasone dipropionate (DIPROLENE) 0.05 % cream APPLY TO AFFECTED AREA TWICE A DAY 30 g 0  . diphenhydrAMINE (BENADRYL) 25 MG tablet Take 50 mg by mouth 2 (two) times daily as needed for itching.     [name] EPINEPHrine 0.3 mg/0.3 mL IJ SOAJ injection Inject 0.3 mLs (0.3 mg total) into the muscle as needed for anaphylaxis. 1 Device 3  . fluticasone (FLONASE) 50 MCG/ACT nasal spray SPRAY 2 SPRAYS INTO EACH NOSTRIL EVERY DAY 48 mL 1  . HYDROcodone-acetaminophen (NORCO/VICODIN) 5-325 MG tablet Take 1 tablet by mouth every 6 (six) hours as needed. for pain    . levocetirizine (XYZAL) 5 MG tablet Take 1 tablet (5 mg total) by mouth every evening. 90 tablet 3  . levothyroxine (SYNTHROID) 100 MCG tablet Take 1 tablet (100 mcg total) by mouth daily. 90 tablet 2  . nitroGLYCERIN (NITROSTAT) 0.4 MG SL tablet Place 1 tablet (0.4 mg total) under the tongue every 5 (five) minutes as needed for chest pain. 20 tablet 2  . pantoprazole (PROTONIX) 40 MG tablet Take 1 tablet (40 mg total) by mouth daily. 90 tablet 3  . triamcinolone (KENALOG) 0.025 % ointment Apply 1 application topically 2 (two) times daily. (Patient taking differently: Apply 1 application topically 2 (two) times daily as needed (rash). ) 30 g 0  . hydrochlorothiazide (HYDRODIURIL) 50 MG tablet Take 1 tablet (50 mg total) by mouth  daily. 90 tablet 1  . PARoxetine (PAXIL) 20 MG tablet Take 1 tablet (20 mg total) by mouth daily. 90 tablet 1   No current facility-administered medications for this visit.    Allergies:   Alpha-gal, Beef (bovine) protein, Beef-derived products, Lambs quarters, Pork-derived products, Prednisone, Brilinta [ticagrelor], Doxycycline, Methylprednisolone sodium succ, Penicillins, Shellfish allergy, Adhesive [tape], and Fentanyl    ROS:  Please see the history of present illness.  Otherwise, review of systems are positive for radial nerve pain, alpha gal, back pain.   All other systems are reviewed and negative.    PHYSICAL EXAM: VS:  BP 128/72   Pulse 64   Ht 5' 10.5" (1.791 m)   Wt 201 lb 12.8 oz (91.5 kg)   SpO2 98%   BMI 28.55 kg/m  , BMI Body mass index is 28.55 kg/m. GENERAL:  Well appearing NECK:  No jugular venous distention, waveform within normal limits, carotid upstroke brisk and symmetric, no bruits, no thyromegaly LUNGS:  Clear to auscultation bilaterally CHEST:  Unremarkable HEART:  PMI not displaced or sustained,S1 and S2 within normal limits, no S3, no S4, no clicks, no rubs, no murmurs ABD:  Flat, positive bowel sounds normal in frequency in pitch, no bruits, no rebound, no guarding, no midline pulsatile mass, no hepatomegaly, no splenomegaly EXT:  2 plus pulses throughout, no edema, no cyanosis no clubbing    EKG:  EKG is ordered today. The ekg ordered today demonstrates sinus rhythm, rate 64, axis within normal limits, intervals within normal limits, no acute ST-T wave changes.   Recent Labs: 03/05/2019: Hemoglobin 15.0; Platelets 251; TSH 2.200 06/12/2019: ALT 41; BUN 15; Creatinine, Ser 1.04; Potassium 3.6; Sodium 142    Lipid Panel    Component Value Date/Time   CHOL 141 03/05/2019 1004   CHOL 136 10/29/2012 0922   TRIG 148 03/05/2019 1004   TRIG 139 04/28/2013 1505   TRIG 103 10/29/2012 0922   HDL 46 03/05/2019 1004   HDL 44 04/28/2013 1505   HDL 41  10/29/2012 0922   CHOLHDL 3.1 03/05/2019 1004   LDLCALC 65 03/05/2019 1004   LDLCALC 50 04/28/2013 1505   LDLCALC 74 10/29/2012 0922      Wt Readings from Last 3 Encounters:  09/11/19 201 lb 12.8 oz (91.5 kg)  07/29/19 198 lb (89.8 kg)  06/12/19 203 lb (92.1 kg)      Other studies Reviewed: Additional studies/ records that were reviewed today include: Labs. Review of the above records demonstrates:  Please see elsewhere in the note.     ASSESSMENT AND PLAN:  CAD S/P percutaneous coronary angioplasty Given the chest discomfort and his previous history he needs to be screened.  With his back pain he would not be able to walk on a treadmill.  Therefore, he will have a [employer].  Chest pain This will be evaluated as above.  Essential hypertension The blood pressure is controlled.  No change in therapy.   Palpitations These are increased palpitations.  He needs a 3-day Holter monitor.   Dyslipidemia LDL this summer was 65 with an HDL of 46.  No change in therapy.  Covid education I did answer his questions about this.  He thinks he would get vaccinated when it is his turn.  Current medicines are reviewed at length with the patient today.  The patient does not have concerns regarding medicines.  The following changes have been made:  no change  Labs/ tests ordered today include:   Orders Placed This Encounter  Procedures  . LONG TERM MONITOR (3-14 DAYS)  . MYOCARDIAL PERFUSION IMAGING     Disposition:   FU with me in six weeks in [place].     Signed, Minus Breeding, MD  09/11/2019 10:48 AM    [facility]

## 2019-09-11 ENCOUNTER — Telehealth: Payer: Self-pay | Admitting: Radiology

## 2019-09-11 ENCOUNTER — Encounter: Payer: Self-pay | Admitting: Cardiology

## 2019-09-11 ENCOUNTER — Other Ambulatory Visit: Payer: Self-pay

## 2019-09-11 ENCOUNTER — Ambulatory Visit: Payer: BC Managed Care – PPO | Admitting: Cardiology

## 2019-09-11 VITALS — BP 128/72 | HR 64 | Ht 70.5 in | Wt 201.8 lb

## 2019-09-11 DIAGNOSIS — I25119 Atherosclerotic heart disease of native coronary artery with unspecified angina pectoris: Secondary | ICD-10-CM

## 2019-09-11 DIAGNOSIS — Z7189 Other specified counseling: Secondary | ICD-10-CM | POA: Diagnosis not present

## 2019-09-11 DIAGNOSIS — R072 Precordial pain: Secondary | ICD-10-CM

## 2019-09-11 DIAGNOSIS — R001 Bradycardia, unspecified: Secondary | ICD-10-CM

## 2019-09-11 DIAGNOSIS — R002 Palpitations: Secondary | ICD-10-CM

## 2019-09-11 DIAGNOSIS — I10 Essential (primary) hypertension: Secondary | ICD-10-CM

## 2019-09-11 NOTE — Patient Instructions (Signed)
Medication Instructions:  No Changes *If you need a refill on your cardiac medications before your next appointment, please call your pharmacy*  Lab Work: None  Testing/Procedures: Your physician has recommended that you wear an event monitor. Event monitors are medical devices that record the heart's electrical activity. Doctors most often [place] these monitors to diagnose arrhythmias. Arrhythmias are problems with the speed or rhythm of the heartbeat. The monitor is a small, portable device. You can wear one while you do your normal daily activities. This is usually used to diagnose what is causing palpitations/syncope (passing out).  Your physician has requested that you have a lexiscan myoview. For further information please visit [email or web address]. Please follow instruction sheet, as given.   Follow-Up: At [facility], you and your health needs are our priority.  As part of our continuing mission to provide you with exceptional heart care, we have created designated Provider Care Teams.  These Care Teams include your primary Cardiologist (physician) and Advanced Practice Providers (APPs -  Physician Assistants and Nurse Practitioners) who all work together to provide you with the care you need, when you need it.  Your next appointment:   6 week(s) in [place]  The format for your next appointment:   In Person  Provider:   Minus Breeding, MD  Other Instructions [place] Monitor Instructions   Your physician has requested you wear your ZIO patch monitor_3_days.   This is a single patch monitor.  [employer] supplies one patch monitor per enrollment.  Additional stickers are not available.   Please do not apply patch if you will be having a Nuclear Stress Test, Echocardiogram, Cardiac CT, MRI, or Chest Xray during the time frame you would be wearing the monitor. The patch cannot be worn during these tests.  You cannot remove and re-apply the ZIO XT patch monitor.   Your ZIO  patch monitor will be sent USPS Priority mail from [facility] directly to your home address. The monitor may also be mailed to a PO BOX if home delivery is not available.   It may take 3-5 days to receive your monitor after you have been enrolled.   Once you have received you monitor, please review enclosed instructions.  Your monitor has already been registered assigning a specific monitor serial # to you.   Applying the monitor   Shave hair from upper left chest.   Hold abrader disc by orange tab.  Rub abrader in 40 strokes over left upper chest as indicated in your monitor instructions.   Clean area with 4 enclosed alcohol pads .  Use all pads to assure are is cleaned thoroughly.  Let dry.   Apply patch as indicated in monitor instructions.  Patch will be place under collarbone on left side of chest with arrow pointing upward.   Rub patch adhesive wings for 2 minutes.Remove white label marked "1".  Remove white label marked "2".  Rub patch adhesive wings for 2 additional minutes.   While looking in a mirror, press and release button in center of patch.  A small green light will flash 3-4 times .  This will be your only indicator the monitor has been turned on.     Do not shower for the first 24 hours.  You may shower after the first 24 hours.   Press button if you feel a symptom. You will hear a small click.  Record Date, Time and Symptom in the Patient Log Book.   When you are  ready to remove patch, follow instructions on last 2 pages of Patient Log Book.  Stick patch monitor onto last page of Patient Log Book.   Place Patient Log Book in [place] box.  Use locking tab on box and tape box closed securely.  The [employer] has [employer] on it.  Please place in mailbox as soon as possible.  Your physician should have your test results approximately 7 days after the monitor has been mailed back to [facility].   Call [place] at [phone] if  you have questions regarding your ZIO XT patch monitor.  Call them immediately if you see an orange light blinking on your monitor.   If your monitor falls off in less than 4 days contact our Monitor department at [phone].  If your monitor becomes loose or falls off after 4 days call [employer] at [phone] for suggestions on securing your monitor.

## 2019-09-11 NOTE — Telephone Encounter (Signed)
Enrolled patient for a 14 day Zio monitor to be mailed to patients home.  

## 2019-09-15 ENCOUNTER — Other Ambulatory Visit: Payer: Self-pay

## 2019-09-15 ENCOUNTER — Telehealth (HOSPITAL_COMMUNITY): Payer: Self-pay

## 2019-09-15 DIAGNOSIS — M5417 Radiculopathy, lumbosacral region: Secondary | ICD-10-CM | POA: Diagnosis not present

## 2019-09-15 NOTE — Telephone Encounter (Signed)
Encounter complete. 

## 2019-09-15 NOTE — Addendum Note (Signed)
Addended by: Hinton Dyer on: 09/15/2019 04:16 PM   Modules accepted: Orders

## 2019-09-16 ENCOUNTER — Other Ambulatory Visit: Payer: Self-pay | Admitting: Neurological Surgery

## 2019-09-16 ENCOUNTER — Other Ambulatory Visit: Payer: Self-pay

## 2019-09-16 ENCOUNTER — Telehealth: Payer: Self-pay | Admitting: *Deleted

## 2019-09-16 ENCOUNTER — Ambulatory Visit: Payer: BC Managed Care – PPO | Admitting: Family Medicine

## 2019-09-16 ENCOUNTER — Encounter: Payer: Self-pay | Admitting: Family Medicine

## 2019-09-16 VITALS — BP 114/68 | HR 57 | Temp 98.4°F | Resp 20 | Ht 70.5 in | Wt 202.0 lb

## 2019-09-16 DIAGNOSIS — D689 Coagulation defect, unspecified: Secondary | ICD-10-CM | POA: Diagnosis not present

## 2019-09-16 DIAGNOSIS — E782 Mixed hyperlipidemia: Secondary | ICD-10-CM | POA: Diagnosis not present

## 2019-09-16 DIAGNOSIS — M5416 Radiculopathy, lumbar region: Secondary | ICD-10-CM | POA: Diagnosis not present

## 2019-09-16 DIAGNOSIS — I10 Essential (primary) hypertension: Secondary | ICD-10-CM

## 2019-09-16 DIAGNOSIS — K219 Gastro-esophageal reflux disease without esophagitis: Secondary | ICD-10-CM

## 2019-09-16 DIAGNOSIS — E039 Hypothyroidism, unspecified: Secondary | ICD-10-CM

## 2019-09-16 DIAGNOSIS — F411 Generalized anxiety disorder: Secondary | ICD-10-CM | POA: Diagnosis not present

## 2019-09-16 DIAGNOSIS — M5417 Radiculopathy, lumbosacral region: Secondary | ICD-10-CM

## 2019-09-16 MED ORDER — PAROXETINE HCL 20 MG PO TABS: 20.0000 mg | ORAL_TABLET | Freq: Every day | ORAL | 1 refills | Status: DC

## 2019-09-16 NOTE — Telephone Encounter (Signed)
   [facility] Pre-operative Risk Assessment    Request for surgical clearance:  1. What type of surgery is being performed? LUMBAR MYELOGRAM WITH POST MYELOGRAM CT   2. When is this surgery scheduled? TBD   3. What type of clearance is required (medical clearance vs. Pharmacy clearance to hold med vs. Both)? MEDICAL  4. Are there any medications that need to be held prior to surgery and how long? PLAVIX    5. Practice name and name of physician performing surgery? [facility]; DR. Kristeen Miss   6. What is your office phone number [phone]    7.   What is your office fax number [phone] ATTN: JESSICA  8.   Anesthesia type (None, local, MAC, general) ? NOT LISTED   Julaine Hua 09/16/2019, 10:29 AM  _________________________________________________________________   (provider comments below)

## 2019-09-16 NOTE — Patient Instructions (Addendum)
If your symptoms worsen or you have thoughts of suicide/homicide, PLEASE SEEK IMMEDIATE MEDICAL ATTENTION.  You may always call the National Suicide Hotline.  This is available 24 hours a day, 7 days a week.  Their number is: 1-800-273-8255  Taking the medicine as directed and not missing any doses is one of the best things you can do to treat your depression.  Here are some things to keep in mind:  1) Side effects (stomach upset, some increased anxiety) may happen before you notice a benefit.  These side effects typically go away over time. 2) Changes to your dose of medicine or a change in medication all together is sometimes necessary 3) Most people need to be on medication at least 12 months 4) Many people will notice an improvement within two weeks but the full effect of the medication can take up to 4-6 weeks 5) Stopping the medication when you start feeling better often results in a return of symptoms 6) Never discontinue your medication without contacting a health care professional first.  Some medications require gradual discontinuation/ taper and can make you sick if you stop them abruptly.  If your symptoms worsen or you have thoughts of suicide/homicide, PLEASE SEEK IMMEDIATE MEDICAL ATTENTION.  You may always call:  National Suicide Hotline: 800-273-8255 [place] Crisis Line: 336-832-9700 Crisis Recovery in [place]: 800-939-5911   These are available 24 hours a day, 7 days a week.   

## 2019-09-16 NOTE — Telephone Encounter (Signed)
Patient was recently seen by Dr. [name] on 09/11/2019 for intermittent chest pain over the prior 2 weeks as well as increased palpitations. A nuclear stress test and 3-day holter monitor were ordered. Lexiscan Myoview is scheduled for tomorrow (09/17/2019). Will wait for these results before addressing pre-operative risk.   Darreld Mclean, PA-C 09/16/2019 10:39 AM

## 2019-09-16 NOTE — Progress Notes (Signed)
Subjective:  Patient ID: Jeffrey Hooper, male    DOB: 02-19-1961, 59 y.o.   MRN: [id number]  Patient Care Team: Baruch Gouty, FNP as PCP - General (Family Medicine) Minus Breeding, MD as PCP - Cardiology (Cardiology)   Chief Complaint:  Medical Management of Chronic Issues (4 mo ), Hypothyroidism, and Hyperlipidemia   HPI: Jeffrey Hooper is a 59 y.o. male presenting on 09/16/2019 for Medical Management of Chronic Issues (4 mo ), Hypothyroidism, and Hyperlipidemia    1. Essential hypertension Patient reports great control of his blood pressure on current regimen.  He denies chest pain, shortness of breath, palpitations, dizziness, leg swelling, confusion, headaches, or weakness.  No adverse side effects to medications.  2. Acquired hypothyroidism Compliant with current repletion therapy and doing well.  No changes in hair, bowel habits, weight, mood, or energy level.  3. Clotting disorder (HCC) No bruising, bleeding, shortness of breath, chest pain, or leg swelling.   4. Lumbar radiculopathy, chronic Ongoing and worsening symptoms.  Has upcoming appointment with neurology for myelogram.  States he has had worsening pain down his right leg.  Has been placed on a steroid Dosepak.  Has not noticed any changes in symptoms as he just started the medication.  He is using his cane to assist with ambulation.  5. Gastroesophageal reflux disease without esophagitis Compliant with medications.  No adverse side effects.  No voice change, cough, trouble swallowing, dysphagia, hematochezia, melena, or hematemesis.  No early satiety, weight loss, fatigue, fever, or abdominal pain.  6. Mixed hyperlipidemia Compliant with medications without arthralgias or myalgias.  Does try to watch diet but has a difficult time with this.  Does not exercise on a regular basis due to chronic lumbar radiculopathy.  7. GAD (generalized anxiety disorder) Patient states he has had a little bit of increased  anxiety due to his ongoing medical condition.  States that he feels he is able to control his anxiety with current medications and relaxation techniques.  Does feel that the medication has been beneficial to reduce his symptoms.  No SI or HI. GAD 7 : Generalized Anxiety Score 09/16/2019 06/12/2019 03/19/2019 12/02/2018  Nervous, Anxious, on Edge _0 0  Control/stop worrying _1 0  Worry too much - different things _2 0  Trouble relaxing 0 _3 Restless 0 2 0 0  Easily annoyed or irritable _4 0  Afraid - awful might happen 1 1 0 0  Total GAD 7 Score _5 Anxiety Difficulty - - - -    Depression screen [facility] 2/9 09/16/2019 07/29/2019 06/12/2019 03/19/2019 03/05/2019  Decreased Interest 0 0 0 1 0  Down, Depressed, Hopeless 0 0 0 0 0  PHQ - 2 Score 0 0 0 1 0  Altered sleeping - - - - -  Tired, decreased energy - - - - -  Change in appetite - - - - -  Feeling bad or failure about yourself  - - - - -  Trouble concentrating - - - - -  Moving slowly or fidgety/restless - - - - -  Suicidal thoughts - - - - -  PHQ-9 Score - - - - -  Difficult doing work/chores - - - - -  Some recent data might be hidden       Relevant past medical, surgical, family, and social history reviewed and updated as indicated.  Allergies and medications reviewed and  updated. Date reviewed: Chart in Epic.   Past Medical History:  Diagnosis Date  . Allergy   . Anxiety   . Arthritis   . CAD (coronary artery disease), native coronary artery 12/03/2015  . Coronary artery disease   . Dysrhythmia    "irregular and PAC'S"  . GERD (gastroesophageal reflux disease)   . Head injury with loss of consciousness ([place])    back in the 1980's  . History of kidney stones   . Hyperlipidemia   . Hypertension   . Hypothyroid   . Multiple benign nevi   . Neuromuscular disorder ([place]) 2011   LEFT RADIAL NERVE SURGERY R/T TRAUMA  . Pneumonia 2014, 2017  . Prostatism   . Pulmonary nodule     Past Surgical  History:  Procedure Laterality Date  . BACK SURGERY     T12 - L1  . CARDIAC CATHETERIZATION N/A 12/02/2015   Procedure: Left Heart Cath and Coronary Angiography;  Surgeon: Peter M Martinique, MD;  Location: [place] [facility];  Service: Cardiovascular;  Laterality: N/A;  . CARDIAC CATHETERIZATION N/A 12/02/2015   Procedure: Coronary Stent Intervention;  Surgeon: Peter M Martinique, MD;  Location: [place] [facility];  Service: Cardiovascular;  Laterality: N/A;  mid LAD Promus 2.5x12  . CORONARY ANGIOPLASTY     one stent placed by Dr. P Martinique  . HARDWARE REMOVAL Right 08/26/2017   Procedure: Right Lumbar Five Revision of pedicle screw with Removal of Lumbar Five Screw;  Surgeon: Kristeen Miss, MD;  Location: [place];  Service: Neurosurgery;  Laterality: Right;  posterior  . HARDWARE REMOVAL Right 12/27/2017   Procedure: Right Lumbar Two, Lumbar Three, Lumbar Four Pedicle screw removal with metrex;  Surgeon: Kristeen Miss, MD;  Location: [place];  Service: Neurosurgery;  Laterality: Right;  Right L2 to L4 Pedicle screw removal with mterex  . MASS EXCISION  06/25/2012   Procedure: EXCISION MASS;  Surgeon: Wynonia Sours, MD;  Location: [place];  Service: Orthopedics;  Laterality: Left;  transection of NEUROMA, BURYING RADIAL NERVE IN BRACHIORADIALIS LEFT SIDE  . radial nerve Left    cut in work injury  . SPINAL FUSION     C6-7  . TONSILLECTOMY  1982  . VASECTOMY      Social History   Socioeconomic History  . Marital status: Married    Spouse name: Not on file  . Number of children: 3  . Years of education: Not on file  . Highest education level: Not on file  Occupational History  . Occupation: [employer]  Tobacco Use  . Smoking status: Former Smoker    Packs/day: 1.00    Years: 20.00    Pack years: 20.00    Types: Cigarettes    Quit date: 11/28/2006    Years since quitting: 12.8  . Smokeless tobacco: Never Used  Substance and Sexual Activity  . Alcohol use:  Yes    Alcohol/week: 0.0 standard drinks    Comment: rarely  . Drug use: No  . Sexual activity: Yes  Other Topics Concern  . Not on file  Social History Narrative   Lives at home with wife, new 9 year old adopted boy.     Social Determinants of Health   Financial Resource Strain:   . Difficulty of Paying Living Expenses: Not on file  Food Insecurity:   . Worried About Charity fundraiser in the Last Year: Not on file  . Ran Out of  Food in the Last Year: Not on file  Transportation Needs:   . Lack of Transportation (Medical): Not on file  . Lack of Transportation (Non-Medical): Not on file  Physical Activity:   . Days of Exercise per Week: Not on file  . Minutes of Exercise per Session: Not on file  Stress:   . Feeling of Stress : Not on file  Social Connections:   . Frequency of Communication with Friends and Family: Not on file  . Frequency of Social Gatherings with Friends and Family: Not on file  . Attends Religious Services: Not on file  . Active Member of Clubs or Organizations: Not on file  . Attends Archivist Meetings: Not on file  . Marital Status: Not on file  Intimate Partner Violence:   . Fear of Current or Ex-Partner: Not on file  . Emotionally Abused: Not on file  . Physically Abused: Not on file  . Sexually Abused: Not on file    Outpatient Encounter Medications as of 09/16/2019  Medication Sig  . acetaminophen (TYLENOL) 500 MG tablet Take 1,000 mg by mouth daily as needed for mild pain or headache.   [name] amLODipine (NORVASC) 5 MG tablet Take 1.5 tablets (7.5 mg total) by mouth daily.  [name] aspirin EC 81 MG tablet Take 81 mg by mouth at bedtime.  [name] atorvastatin (LIPITOR) 80 MG tablet Take 1 tablet (80 mg total) by mouth at bedtime.  . betamethasone dipropionate (DIPROLENE) 0.05 % cream APPLY TO AFFECTED AREA TWICE A DAY  . diphenhydrAMINE (BENADRYL) 25 MG tablet Take 50 mg by mouth 2 (two) times daily as needed for itching.   [name] EPINEPHrine 0.3 mg/0.3  mL IJ SOAJ injection Inject 0.3 mLs (0.3 mg total) into the muscle as needed for anaphylaxis.  . fluticasone (FLONASE) 50 MCG/ACT nasal spray SPRAY 2 SPRAYS INTO EACH NOSTRIL EVERY DAY  . HYDROcodone-acetaminophen (NORCO/VICODIN) 5-325 MG tablet Take 1 tablet by mouth every 6 (six) hours as needed. for pain  . levocetirizine (XYZAL) 5 MG tablet Take 1 tablet (5 mg total) by mouth every evening.  [name] levothyroxine (SYNTHROID) 100 MCG tablet Take 1 tablet (100 mcg total) by mouth daily.  . nitroGLYCERIN (NITROSTAT) 0.4 MG SL tablet Place 1 tablet (0.4 mg total) under the tongue every 5 (five) minutes as needed for chest pain.  . pantoprazole (PROTONIX) 40 MG tablet Take 1 tablet (40 mg total) by mouth daily.  . [DISCONTINUED] triamcinolone (KENALOG) 0.025 % ointment Apply 1 application topically 2 (two) times daily. (Patient taking differently: Apply 1 application topically 2 (two) times daily as needed (rash). )  . hydrochlorothiazide (HYDRODIURIL) 50 MG tablet Take 1 tablet (50 mg total) by mouth daily.  [name] PARoxetine (PAXIL) 20 MG tablet Take 1 tablet (20 mg total) by mouth daily.  . [DISCONTINUED] PARoxetine (PAXIL) 20 MG tablet Take 1 tablet (20 mg total) by mouth daily.   No facility-administered encounter medications on file as of 09/16/2019.    Allergies  Allergen Reactions  . Alpha-Gal Rash and Other (See Comments)    SEVERE ALLERGY TO ANY MEAT OR MEAT DERIVED PRODUCTS FROM 4 LEGGED ANIMALS > > BEEF, PORK , GOATS, DEER, ETC. < < RESULT OF BITE FROM LONE STAR TICK  . Beef (Bovine) Protein Rash and Other (See Comments)  . Beef-Derived Products Rash and Other (See Comments)  . Lambs Quarters Rash and Other (See Comments)  . Pork-Derived Products Rash and Other (See Comments)  . Prednisone Other (See Comments)  im depo medrol caused dizziness - pt passed out. Ask pt before giving (pt has taken since the reaction with no issues)  . Brilinta [Ticagrelor] Hives  . Doxycycline Hives  .  Methylprednisolone Sodium Succ     im depo medrol caused dizziness - pt passed out. Ask pt before giving   . Penicillins Hives    Has patient had a PCN reaction causing immediate rash, facial/tongue/throat swelling, SOB or lightheadedness with hypotension: NO Pt states has has taken PCN after Vibramycin and not had any problems. Has patient had a PCN reaction causing severe rash involving mucus membranes or skin necrosis: No Has patient had a PCN reaction that required hospitalization No Has patient had a PCN reaction occurring within the last 10 years: No If all of the above answers are "NO", then may proceed with Cephalospo  . Shellfish Allergy Hives  . Adhesive [Tape] Rash  . Fentanyl Rash    Reaction to adhesive, not the drug     Review of Systems  Constitutional: Negative for activity change, appetite change, chills, diaphoresis, fatigue, fever and unexpected weight change.  HENT: Negative.  Negative for sore throat, trouble swallowing and voice change.   Eyes: Negative.  Negative for photophobia and visual disturbance.  Respiratory: Negative for cough, chest tightness and shortness of breath.   Cardiovascular: Negative for chest pain, palpitations and leg swelling.  Gastrointestinal: Negative for abdominal pain, blood in stool, constipation, diarrhea, nausea and vomiting.  Endocrine: Negative.  Negative for cold intolerance, heat intolerance, polydipsia, polyphagia and polyuria.  Genitourinary: Negative for decreased urine volume, difficulty urinating, dysuria, frequency and urgency.  Musculoskeletal: Positive for arthralgias, back pain, gait problem and myalgias. Negative for joint swelling, neck pain and neck stiffness.  Skin: Negative.   Allergic/Immunologic: Negative.   Neurological: Negative for dizziness, tremors, seizures, syncope, facial asymmetry, speech difficulty, weakness, light-headedness, numbness and headaches.  Hematological: Negative.   Psychiatric/Behavioral:  Positive for agitation, decreased concentration and dysphoric mood. Negative for behavioral problems, confusion, hallucinations, self-injury, sleep disturbance and suicidal ideas. The patient is nervous/anxious. The patient is not hyperactive.   All other systems reviewed and are negative.       Objective:  BP 114/68   Pulse (!) 57   Temp 98.4 F (36.9 C)   Resp 20   Ht 5' 10.5" (1.791 m)   Wt 202 lb (91.6 kg)   SpO2 100%   BMI 28.57 kg/m    Wt Readings from Last 3 Encounters:  09/17/19 201 lb (91.2 kg)  09/16/19 202 lb (91.6 kg)  09/11/19 201 lb 12.8 oz (91.5 kg)    Physical Exam Vitals and nursing note reviewed.  Constitutional:      General: He is not in acute distress.    Appearance: Normal appearance. He is well-developed, well-groomed and overweight. He is not ill-appearing, toxic-appearing or diaphoretic.  HENT:     Head: Normocephalic and atraumatic.     Jaw: There is normal jaw occlusion.     Right Ear: Hearing normal.     Left Ear: Hearing normal.     Nose: Nose normal.     Mouth/Throat:     Lips: Pink.     Mouth: Mucous membranes are moist.     Pharynx: Oropharynx is clear. Uvula midline.  Eyes:     General: Lids are normal.     Extraocular Movements: Extraocular movements intact.     Conjunctiva/sclera: Conjunctivae normal.     Pupils: Pupils are equal, round, and reactive to light.  Neck:     Thyroid: No thyroid mass, thyromegaly or thyroid tenderness.     Vascular: No carotid bruit or JVD.     Trachea: Trachea and phonation normal.  Cardiovascular:     Rate and Rhythm: Normal rate and regular rhythm.     Chest Wall: PMI is not displaced.     Pulses: Normal pulses.     Heart sounds: Normal heart sounds. No murmur. No friction rub. No gallop.   Pulmonary:     Effort: Pulmonary effort is normal. No respiratory distress.     Breath sounds: Normal breath sounds. No wheezing.  Abdominal:     General: Bowel sounds are normal. There is no distension or  abdominal bruit.     Palpations: Abdomen is soft. There is no hepatomegaly or splenomegaly.     Tenderness: There is no abdominal tenderness. There is no right CVA tenderness or left CVA tenderness.     Hernia: No hernia is present.  Musculoskeletal:     Cervical back: Normal, normal range of motion and neck supple.     Thoracic back: Tenderness present. No bony tenderness. Decreased range of motion.     Lumbar back: Tenderness present. Decreased range of motion. Positive right straight leg raise test.     Right hip: Normal.     Left hip: Normal.     Right lower leg: No edema.     Left lower leg: No edema.  Lymphadenopathy:     Cervical: No cervical adenopathy.  Skin:    General: Skin is warm and dry.     Capillary Refill: Capillary refill takes less than 2 seconds.     Coloration: Skin is not cyanotic, jaundiced or pale.     Findings: No rash.  Neurological:     General: No focal deficit present.     Mental Status: He is alert and oriented to person, place, and time.     Cranial Nerves: Cranial nerves are intact. No cranial nerve deficit.     Sensory: Sensation is intact. No sensory deficit.     Motor: Motor function is intact. No weakness.     Coordination: Coordination is intact. Coordination normal.     Gait: Gait abnormal (antalgic gait, uses cane).     Deep Tendon Reflexes: Reflexes are normal and symmetric. Reflexes normal.  Psychiatric:        Attention and Perception: Attention and perception normal.        Mood and Affect: Mood and affect normal.        Speech: Speech normal.        Behavior: Behavior normal. Behavior is cooperative.        Thought Content: Thought content normal.        Cognition and Memory: Cognition and memory normal.        Judgment: Judgment normal.     Results for orders placed or performed in visit on 09/16/19  CBC with Differential/Platelet  Result Value Ref Range   WBC 7.0 3.4 - 10.8 x10E3/uL   RBC 5.04 4.14 - 5.80 x10E6/uL   Hemoglobin  14.9 13.0 - 17.7 g/dL   Hematocrit 43.5 37.5 - 51.0 %   MCV 86 79 - 97 fL   MCH 29.6 26.6 - 33.0 pg   MCHC 34.3 31.5 - 35.7 g/dL   RDW 14.2 11.6 - 15.4 %   Platelets 236 150 - 450 x10E3/uL   Neutrophils 71 Not Estab. %   Lymphs 18 Not Estab. %  Monocytes 7 Not Estab. %   Eos 3 Not Estab. %   Basos 1 Not Estab. %   Neutrophils Absolute 5.0 1.4 - 7.0 x10E3/uL   Lymphocytes Absolute 1.3 0.7 - 3.1 x10E3/uL   Monocytes Absolute 0.5 0.1 - 0.9 x10E3/uL   EOS (ABSOLUTE) 0.2 0.0 - 0.4 x10E3/uL   Basophils Absolute 0.1 0.0 - 0.2 x10E3/uL   Immature Granulocytes 0 Not Estab. %   Immature Grans (Abs) 0.0 0.0 - 0.1 x10E3/uL  CMP14+EGFR  Result Value Ref Range   Glucose 91 65 - 99 mg/dL   BUN 15 6 - 24 mg/dL   Creatinine, Ser 1.08 0.76 - 1.27 mg/dL   GFR calc non Af Amer 75 >59 mL/min/1.73   GFR calc Af Amer 87 >59 mL/min/1.73   BUN/Creatinine Ratio 14 9 - 20   Sodium 144 134 - 144 mmol/L   Potassium 4.1 3.5 - 5.2 mmol/L   Chloride 100 96 - 106 mmol/L   CO2 28 20 - 29 mmol/L   Calcium 9.6 8.7 - 10.2 mg/dL   Total Protein 6.7 6.0 - 8.5 g/dL   Albumin 4.6 3.8 - 4.9 g/dL   Globulin, Total 2.1 1.5 - 4.5 g/dL   Albumin/Globulin Ratio 2.2 1.2 - 2.2   Bilirubin Total 0.8 0.0 - 1.2 mg/dL   Alkaline Phosphatase 129 (H) 39 - 117 IU/L   AST 25 0 - 40 IU/L   ALT 34 0 - 44 IU/L  Lipid panel  Result Value Ref Range   Cholesterol, Total 136 100 - 199 mg/dL   Triglycerides 73 0 - 149 mg/dL   HDL 54 >39 mg/dL   VLDL Cholesterol Cal 15 5 - 40 mg/dL   LDL Chol Calc (NIH) 67 0 - 99 mg/dL   Chol/HDL Ratio 2.5 0.0 - 5.0 ratio  Thyroid Panel With TSH  Result Value Ref Range   TSH 2.070 0.450 - 4.500 uIU/mL   T4, Total 11.7 4.5 - 12.0 ug/dL   T3 Uptake Ratio 25 24 - 39 %   Free Thyroxine Index 2.9 1.2 - 4.9       Pertinent labs & imaging results that were available during my care of the patient were reviewed by me and considered in my medical decision making.  Assessment & Plan:  Jeffrey Hooper was  seen today for medical management of chronic issues, hypothyroidism and hyperlipidemia.  Diagnoses and all orders for this visit:  Essential hypertension BP well controlled. Changes were not made in regimen. Goal BP is 130/80. Pt aware to report any persistent high or low readings. DASH diet and exercise encouraged. Exercise at least 150 minutes per week and increase as tolerated. Goal BMI > 25. Stress management encouraged. Avoid nicotine and tobacco product use. Avoid excessive alcohol and NSAID's. Avoid more than 2000 mg of sodium daily. Medications as prescribed. Follow up as scheduled.  -     CBC with Differential/Platelet -     CMP14+EGFR -     Lipid panel -     Thyroid Panel With TSH  Acquired hypothyroidism Thyroid disease has been well controlled. Labs are pending. Adjustments to regimen will be made if warranted. Make sure to take medications on an empty stomach with a full glass of water. Make sure to avoid vitamins or supplements for at least 4 hours before and 4 hours after taking medications. Repeat labs in 3 months if adjustments are made and in 6 months if stable.   -     Thyroid Panel With  TSH  Clotting disorder (HCC) Stable.  Will check CBC today. -     CBC with Differential/Platelet  Lumbar radiculopathy, chronic Ongoing and worsening symptoms.  Has upcoming appointment for myelogram with neurology.  Patient aware to report any new, worsening, or persistent symptoms.  Gastroesophageal reflux disease without esophagitis No red flags present. Diet discussed. Avoid fried, spicy, fatty, greasy, and acidic foods. Avoid caffeine, nicotine, and alcohol. Do not eat 2-3 hours before bedtime and stay upright for at least 1-2 hours after eating. Eat small frequent meals. Avoid NSAID's like motrin and aleve. Medications as prescribed. Report any new or worsening symptoms. Follow up as discussed or sooner if needed.   -     CBC with Differential/Platelet  Mixed hyperlipidemia Diet  encouraged - increase intake of fresh fruits and vegetables, increase intake of lean proteins. Bake, broil, or grill foods. Avoid fried, greasy, and fatty foods. Avoid fast foods. Increase intake of fiber-rich whole grains. Exercise encouraged - at least 150 minutes per week and advance as tolerated.  Goal BMI < 25. Continue medications as prescribed. Follow up in 3-6 months as discussed.  -     CMP14+EGFR -     Lipid panel  GAD (generalized anxiety disorder) Doing well on current medications.  Slight increase in anxiety due to medical condition.  Patient denies need for increase in dosage today.  We will continue below.  Patient aware to report any new, worsening, or persistent symptoms. -     Thyroid Panel With TSH -     PARoxetine (PAXIL) 20 MG tablet; Take 1 tablet (20 mg total) by mouth daily.  Total time spent with patient 45 minutes.  Greater than 50% of encounter spent in coordination of care/counseling.    Continue all other maintenance medications.  Follow up plan: Return in about 3 months (around 12/14/2019), or if symptoms worsen or fail to improve, for GAD.  Continue healthy lifestyle choices, including diet (rich in fruits, vegetables, and lean proteins, and low in salt and simple carbohydrates) and exercise (at least 30 minutes of moderate physical activity daily).  Educational handout given for depression  The above assessment and management plan was discussed with the patient. The patient verbalized understanding of and has agreed to the management plan. Patient is aware to call the clinic if they develop any new symptoms or if symptoms persist or worsen. Patient is aware when to return to the clinic for a follow-up visit. Patient educated on when it is appropriate to go to the emergency department.   Monia Pouch, FNP-C [facility] [phone]

## 2019-09-17 ENCOUNTER — Other Ambulatory Visit (INDEPENDENT_AMBULATORY_CARE_PROVIDER_SITE_OTHER): Payer: BC Managed Care – PPO

## 2019-09-17 ENCOUNTER — Ambulatory Visit (HOSPITAL_COMMUNITY)
Admission: RE | Admit: 2019-09-17 | Discharge: 2019-09-17 | Disposition: A | Discharge status: Home / Self Care | Payer: BC Managed Care – PPO | Source: Ambulatory Visit | Attending: Cardiovascular Disease | Admitting: Cardiovascular Disease

## 2019-09-17 DIAGNOSIS — I25119 Atherosclerotic heart disease of native coronary artery with unspecified angina pectoris: Secondary | ICD-10-CM | POA: Insufficient documentation

## 2019-09-17 DIAGNOSIS — R072 Precordial pain: Secondary | ICD-10-CM | POA: Diagnosis not present

## 2019-09-17 DIAGNOSIS — R002 Palpitations: Secondary | ICD-10-CM | POA: Diagnosis not present

## 2019-09-17 LAB — CBC WITH DIFFERENTIAL/PLATELET
Basophils Absolute: 0.1 10*3/uL (ref 0.0–0.2)
Basos: 1 %
EOS (ABSOLUTE): 0.2 10*3/uL (ref 0.0–0.4)
Eos: 3 %
Hematocrit: 43.5 % (ref 37.5–51.0)
Hemoglobin: 14.9 g/dL (ref 13.0–17.7)
Immature Grans (Abs): 0 10*3/uL (ref 0.0–0.1)
Immature Granulocytes: 0 %
Lymphocytes Absolute: 1.3 10*3/uL (ref 0.7–3.1)
Lymphs: 18 %
MCH: 29.6 pg (ref 26.6–33.0)
MCHC: 34.3 g/dL (ref 31.5–35.7)
MCV: 86 fL (ref 79–97)
Monocytes Absolute: 0.5 10*3/uL (ref 0.1–0.9)
Monocytes: 7 %
Neutrophils Absolute: 5 10*3/uL (ref 1.4–7.0)
Neutrophils: 71 %
Platelets: 236 10*3/uL (ref 150–450)
RBC: 5.04 x10E6/uL (ref 4.14–5.80)
RDW: 14.2 % (ref 11.6–15.4)
WBC: 7 10*3/uL (ref 3.4–10.8)

## 2019-09-17 LAB — CMP14+EGFR
ALT: 34 IU/L (ref 0–44)
AST: 25 IU/L (ref 0–40)
Albumin/Globulin Ratio: 2.2 (ref 1.2–2.2)
Albumin: 4.6 g/dL (ref 3.8–4.9)
Alkaline Phosphatase: 129 IU/L — ABNORMAL HIGH (ref 39–117)
BUN/Creatinine Ratio: 14 (ref 9–20)
BUN: 15 mg/dL (ref 6–24)
Bilirubin Total: 0.8 mg/dL (ref 0.0–1.2)
CO2: 28 mmol/L (ref 20–29)
Calcium: 9.6 mg/dL (ref 8.7–10.2)
Chloride: 100 mmol/L (ref 96–106)
Creatinine, Ser: 1.08 mg/dL (ref 0.76–1.27)
GFR calc Af Amer: 87 mL/min/{1.73_m2} (ref >59)
GFR calc non Af Amer: 75 mL/min/{1.73_m2} (ref >59)
Globulin, Total: 2.1 g/dL (ref 1.5–4.5)
Glucose: 91 mg/dL (ref 65–99)
Potassium: 4.1 mmol/L (ref 3.5–5.2)
Sodium: 144 mmol/L (ref 134–144)
Total Protein: 6.7 g/dL (ref 6.0–8.5)

## 2019-09-17 LAB — LIPID PANEL
Chol/HDL Ratio: 2.5 ratio (ref 0.0–5.0)
Cholesterol, Total: 136 mg/dL (ref 100–199)
HDL: 54 mg/dL (ref >39)
LDL Chol Calc (NIH): 67 mg/dL (ref 0–99)
Triglycerides: 73 mg/dL (ref 0–149)
VLDL Cholesterol Cal: 15 mg/dL (ref 5–40)

## 2019-09-17 LAB — MYOCARDIAL PERFUSION IMAGING
LV dias vol: 113 mL (ref 62–150)
LV sys vol: 30 mL
Peak HR: 100 {beats}/min
Rest HR: 69 {beats}/min
SDS: 4
SRS: 3
SSS: 7
TID: 0.96

## 2019-09-17 LAB — THYROID PANEL WITH TSH
Free Thyroxine Index: 2.9 (ref 1.2–4.9)
T3 Uptake Ratio: 25 % (ref 24–39)
T4, Total: 11.7 ug/dL (ref 4.5–12.0)
TSH: 2.07 u[IU]/mL (ref 0.450–4.500)

## 2019-09-17 MED ORDER — REGADENOSON 0.4 MG/5ML IV SOLN
0.4000 mg | Freq: Once | INTRAVENOUS | Status: AC
  Administered 2019-09-17: 0.4 mg via INTRAVENOUS

## 2019-09-17 MED ORDER — TECHNETIUM TC 99M TETROFOSMIN IV KIT
31.4000 | PACK | Freq: Once | INTRAVENOUS | Status: AC | PRN
  Administered 2019-09-17: 31.4 via INTRAVENOUS
  Filled 2019-09-17: qty 32

## 2019-09-17 MED ORDER — TECHNETIUM TC 99M TETROFOSMIN IV KIT
10.1000 | PACK | Freq: Once | INTRAVENOUS | Status: AC | PRN
  Administered 2019-09-17: 10.1 via INTRAVENOUS
  Filled 2019-09-17: qty 11

## 2019-09-23 NOTE — Telephone Encounter (Signed)
Pt was seen 09/11/19 and recommended for nuclear stress test and heart monitor. NM stress test completed 09/17/19 negative for reversible ischemia. Zio patch results not back. Hx of DES to LAD in 2017.   Dr. [name]:  1. Surgeon requests hold for plavix. Can you please comment on holding plavix in the patient for lumbar myelogram?   2. Does he need to wait for results of zio patch placed for palpitations before clearance is given?

## 2019-09-25 NOTE — Telephone Encounter (Signed)
OK to hold Plavix as needed for the procedure.   

## 2019-09-28 NOTE — Telephone Encounter (Signed)
I s/w the pt in regards to monitor. Pt states he wore the monitor x 3 days as instructed. Pt states he returned the monitor he said it was somewhere between last Wed-Fri 2/17-19. He states he does not remember which day he actually mailed it back. I assured the pt that I will let the Pre Op Team know as well as I will ask the monitor tech. I will ask the monitor tech to keep an eye out for the monitor as it needed for his pre op clearance as well. Pt thanked me for the call and the help.

## 2019-09-29 DIAGNOSIS — R002 Palpitations: Secondary | ICD-10-CM | POA: Diagnosis not present

## 2019-10-01 NOTE — Telephone Encounter (Signed)
Looks like results are in for the monitor. I will send note to pre op provider to see if the pt is cleared.

## 2019-10-05 ENCOUNTER — Telehealth: Payer: Self-pay | Admitting: Cardiology

## 2019-10-05 NOTE — Telephone Encounter (Signed)
Agree.  Continue with planned studies.

## 2019-10-05 NOTE — Telephone Encounter (Signed)
Patient calling in for monitor results.

## 2019-10-05 NOTE — Telephone Encounter (Signed)
   Primary Cardiologist: Minus Breeding, MD  Chart reviewed as part of pre-operative protocol coverage. Given past medical history and time since last visit, based on ACC/AHA guidelines, Jeffrey Hooper would be at acceptable risk for the planned procedure without further cardiovascular testing. Recent myoview and heart monitor results reviewed with the patient and they were quite reassuring. According to the patient, he has not been on the plavix since 2018.   I will route this recommendation to the requesting party via Epic fax function and remove from pre-op pool.  Please call with questions.  Almyra Deforest, [place] 10/05/2019, 10:36 AM

## 2019-10-05 NOTE — Telephone Encounter (Signed)
Called pt and gave monitor results. Wanting to know whether he should still have his CT and Myelogram tomorrow 3/2 - advised pt that there should not be an issue. Will send to primary and nurse for review.

## 2019-10-05 NOTE — Telephone Encounter (Signed)
Sent to requesting party via manual fax and EPIC fax

## 2019-10-06 ENCOUNTER — Other Ambulatory Visit: Payer: Self-pay

## 2019-10-06 ENCOUNTER — Ambulatory Visit (HOSPITAL_COMMUNITY)
Admission: RE | Admit: 2019-10-06 | Discharge: 2019-10-06 | Disposition: A | Discharge status: Home / Self Care | Payer: Medicare Other | Source: Ambulatory Visit | Attending: Neurological Surgery | Admitting: Neurological Surgery

## 2019-10-06 DIAGNOSIS — M5417 Radiculopathy, lumbosacral region: Secondary | ICD-10-CM | POA: Diagnosis not present

## 2019-10-06 DIAGNOSIS — M48061 Spinal stenosis, lumbar region without neurogenic claudication: Secondary | ICD-10-CM | POA: Insufficient documentation

## 2019-10-06 DIAGNOSIS — M5126 Other intervertebral disc displacement, lumbar region: Secondary | ICD-10-CM | POA: Diagnosis not present

## 2019-10-06 DIAGNOSIS — Z981 Arthrodesis status: Secondary | ICD-10-CM | POA: Diagnosis not present

## 2019-10-06 DIAGNOSIS — M4325 Fusion of spine, thoracolumbar region: Secondary | ICD-10-CM | POA: Diagnosis not present

## 2019-10-06 DIAGNOSIS — M5416 Radiculopathy, lumbar region: Secondary | ICD-10-CM | POA: Diagnosis not present

## 2019-10-06 MED ORDER — ONDANSETRON HCL 4 MG/2ML IJ SOLN: 4.0000 mg | Freq: Four times a day (QID) | INTRAMUSCULAR | Status: DC | PRN

## 2019-10-06 MED ORDER — HYDROCODONE-ACETAMINOPHEN 5-325 MG PO TABS
ORAL_TABLET | ORAL | Status: AC
  Administered 2019-10-06: 1 via ORAL
  Filled 2019-10-06: qty 1

## 2019-10-06 MED ORDER — DIAZEPAM 5 MG PO TABS
ORAL_TABLET | ORAL | Status: AC
  Filled 2019-10-06: qty 2

## 2019-10-06 MED ORDER — DEXAMETHASONE 4 MG PO TABS
4.0000 mg | ORAL_TABLET | Freq: Once | ORAL | Status: AC
  Administered 2019-10-06: 4 mg via ORAL
  Filled 2019-10-06: qty 1

## 2019-10-06 MED ORDER — HYDROCODONE-ACETAMINOPHEN 5-325 MG PO TABS: 1.0000 | ORAL_TABLET | ORAL | Status: DC | PRN

## 2019-10-06 MED ORDER — DIAZEPAM 5 MG PO TABS
10.0000 mg | ORAL_TABLET | Freq: Once | ORAL | Status: AC
  Administered 2019-10-06: 07:00:00 10 mg via ORAL

## 2019-10-06 MED ORDER — IOHEXOL 180 MG/ML  SOLN
20.0000 mL | Freq: Once | INTRAMUSCULAR | Status: AC | PRN
  Administered 2019-10-06: 10 mL via INTRATHECAL

## 2019-10-06 MED ORDER — LIDOCAINE HCL (PF) 1 % IJ SOLN
5.0000 mL | Freq: Once | INTRAMUSCULAR | Status: AC
  Administered 2019-10-06: 09:00:00 1 mL

## 2019-10-06 NOTE — Procedures (Signed)
Jeffrey Hooper is a 59 year old individuals had chronic back pain and right lower extremity pain that has been troubling him for a number of years has had a previous decompression fusion for thoracolumbar fracture subsequently had degenerative processes in the lower lumbar spine he has had placement of a spinal fusion stimulator but because of his chronic right lumbar radiculopathy a lumbar myelogram is now being performed.  Pre op Dx: Chronic lumbar radiculopathy history of lumbar fusion Post op Dx: Same Procedure: Lumbar myelogram Surgeon: Perel Hauschild Puncture level: L3-4 Fluid color: Clear colorless Injection: Iohexol 180, 10 mL Findings: Relative area of spondylitic stenosis on the right side at the L5-S1 level.  Further evaluation with CT scanning.

## 2019-10-06 NOTE — Progress Notes (Signed)
Spoke with pt about allergy to prednisone, states he is ok to take pill form. Decadron was ordered he states he is ok to take that.

## 2019-10-06 NOTE — Discharge Instructions (Signed)
Myelogram  A myelogram is an imaging test. This test checks for problems in the spinal cord and the places where nerves attach to the spinal cord (nerve roots). A dye (contrast material) is put into your spine before the X-ray. This provides a clearer image for your doctor to see. You may need this test if you have a spinal cord problem that cannot be diagnosed with other imaging tests. You may also have this test to check your spine after surgery. Tell a doctor about:  Any allergies you have, especially to iodine.  All medicines you are taking, including vitamins, herbs, eye drops, creams, and over-the-counter medicines.  Any problems you or family members have had with anesthetic medicines or dye.  Any blood disorders you have.  Any surgeries you have had.  Any medical conditions you have or have had, including asthma.  Whether you are pregnant or may be pregnant. What are the risks? Generally, this is a safe procedure. However, problems may occur, including:  Infection.  Bleeding.  Allergic reaction to medicines or dyes.  Damage to your spinal cord or nerves.  Leaking of spinal fluid. This can cause a headache.  Damage to kidneys.  Seizures. This is rare. What happens before the procedure?  Follow instructions from your doctor about what you cannot eat or drink. You may be asked to drink more fluids.  Ask your doctor about changing or stopping your normal medicines. This is important if you take diabetes medicines or blood thinners.  Plan to have someone take you home from the hospital or clinic.  If you will be going home right after the procedure, plan to have someone with you for 24 hours. What happens during the procedure?  You will lie face down on a table.  Your doctor will find the best injection site on your spine. This is most often in the lower back.  This area will be washed with soap.  You will be given a medicine to numb the area (local  anesthetic).  Your doctor will place a long needle into the space around your spinal cord.  A sample of spinal fluid may be taken. This may be sent to the lab for testing.  The dye will be injected into the space around your spinal cord.  The exam table may be tilted. This helps the dye flow up or down your spine.  The X-ray will take images of your spinal cord.  A bandage (dressing) may be placed over the area where the dye was injected. The procedure may vary among doctors and hospitals. What can I expect after the procedure?  You may be monitored until you leave the hospital or clinic. This includes checking your blood pressure, heart rate, breathing rate, and blood oxygen level.  You may feel sore at the injection site. You may have a mild headache.  You will be told to lie flat with your head raised (elevated). This lowers the risk of a headache.  It is up to you to get the results of your procedure. Ask your doctor, or the department that is doing the procedure, when your results will be ready. Follow these instructions at home:   Rest as told by your doctor. Lie flat with your head slightly elevated.  Do not bend, lift, or do hard work for 24-48 hours, or as told by your doctor.  Take over-the-counter and prescription medicines only as told by your doctor.  Take care of your bandage as told by your   doctor.  Drink enough fluid to keep your pee (urine) pale yellow.  Bathe or shower as told by your doctor. Contact a doctor if:  You have a fever.  You have a headache that lasts longer than 24 hours.  You feel sick to your stomach (nauseous).  You vomit.  Your neck is stiff.  Your legs feel numb.  You cannot pee.  You cannot poop (no bowel movement).  You have a rash.  You are itchy or sneezing. Get help right away if:  You have new symptoms or your symptoms get worse.  You have a seizure.  You have trouble breathing. Summary  A myelogram is an  imaging test that checks for problems in the spinal cord and the places where nerves attach to the spinal cord (nerve roots).  Before the procedure, follow instructions from your doctor. You will be told what not to eat or drink, or what medicines to change or stop.  After the procedure, you will be told to lie flat with your head raised (elevated). This will lower your risk of a headache.  Do not bend, lift, or do any hard work for 24-48 hours, or as told by your doctor.  Contact a doctor if you have a stiff neck or numb legs. Get help right away if your symptoms get worse, or you have a seizure or trouble breathing. This information is not intended to replace advice given to you by your health care provider. Make sure you discuss any questions you have with your health care provider. Document Revised: 10/01/2018 Document Reviewed: 10/02/2018 Elsevier Patient Education  2020 [employer].  

## 2019-10-06 NOTE — Progress Notes (Signed)
Discharge instructions reviewed with pt and his wife Abigail Butts (via telephone) both voice understanding.

## 2019-10-07 ENCOUNTER — Telehealth: Payer: Self-pay | Admitting: *Deleted

## 2019-10-07 ENCOUNTER — Other Ambulatory Visit: Payer: Self-pay | Admitting: Neurological Surgery

## 2019-10-07 DIAGNOSIS — M48061 Spinal stenosis, lumbar region without neurogenic claudication: Secondary | ICD-10-CM | POA: Diagnosis not present

## 2019-10-07 DIAGNOSIS — M5417 Radiculopathy, lumbosacral region: Secondary | ICD-10-CM | POA: Diagnosis not present

## 2019-10-07 DIAGNOSIS — M5416 Radiculopathy, lumbar region: Secondary | ICD-10-CM | POA: Diagnosis not present

## 2019-10-07 NOTE — Telephone Encounter (Signed)
   [facility] Pre-operative Risk Assessment    Request for surgical clearance:  1. What type of surgery is being performed? L5-S1 POSTERIOR LUMBAR INTERBODY FUSION w/FIXATION   2. When is this surgery scheduled? 10/27/19   3. What type of clearance is required (medical clearance vs. Pharmacy clearance to hold med vs. Both)? MEDICAL  4. Are there any medications that need to be held prior to surgery and how long? ASA   5. Practice name and name of physician performing surgery? [facility]; DR. Kristeen Miss   6. What is your office phone number [phone]    7.   What is your office fax number [phone] ATTN: JESSICA  8.   Anesthesia type (None, local, MAC, general) ? GENERAL   Julaine Hua 10/07/2019, 1:55 PM  _________________________________________________________________   (provider comments below)

## 2019-10-08 NOTE — Telephone Encounter (Signed)
   Primary Cardiologist: Minus Breeding, MD  Chart reviewed as part of pre-operative protocol coverage. Given past medical history and time since last visit, based on ACC/AHA guidelines, Jeffrey Hooper would be at acceptable risk for the planned procedure without further cardiovascular testing. Recent myocardial perfusion image was low risk with normal ejection fraction. Heart monitor did not show significant arrhythmia. He may hold aspirin for 5-7 days prior to the surgery and restart as soon as possible afterward at the surgeon's discretion.  I will route this recommendation to the requesting party via Epic fax function and remove from pre-op pool.  Please call with questions.  [place], [place] 10/08/2019, 9:47 AM

## 2019-10-14 NOTE — Progress Notes (Signed)
Cardiology Office Note   Date:  10/21/2019   ID:  Jeffrey Hooper, DOB [birth year], MRN [id number]  PCP:  Baruch Gouty, FNP  Cardiologist:   Minus Breeding, MD   Chief Complaint  Patient presents with  . Coronary Artery Disease      History of Present Illness: Jeffrey Hooper is a 59 y.o. male who presents follow up of CAD.  Stress perfusion study in 2017 suggested a moderate sized defect in the inferior wall with ischemia. The EF was well-preserved.  The patient presented to [facility] on 12/02/15 for cath and was found to have single vessel obstructive CAD involving the mid LAD. He underwent successful PCI with a mid LAD DES.   He had chest pain when I last saw him but he had a negative perfusion study.  He is scheduled for back surgery.  He is continuing to get some fleeting discomfort feeling like a nail pinching in his chest.  However, he is not having any substernal discomfort.  He might have some fleeting skipping beats.  However, 3-day monitor demonstrated very rare supraventricular ventricular beats with only one 4 beat run of SVT.  He is really not describing substernal discomfort, neck or arm discomfort.  He is not having any presyncope or syncope.  He has no new shortness of breath, PND or orthopnea.  He has had no weight gain or edema.  Past Medical History:  Diagnosis Date  . Allergy   . Anxiety   . Arthritis   . CAD (coronary artery disease), native coronary artery 12/03/2015  . Coronary artery disease   . Dysrhythmia    "irregular and PAC'S"  . GERD (gastroesophageal reflux disease)   . Head injury with loss of consciousness ([place])    back in the 1980's  . History of kidney stones   . Hyperlipidemia   . Hypertension   . Hypothyroid   . Multiple benign nevi   . Neuromuscular disorder ([place]) 2011   LEFT RADIAL NERVE SURGERY R/T TRAUMA  . Pneumonia 2014, 2017  . Prostatism   . Pulmonary nodule     Past Surgical History:  Procedure Laterality Date  . BACK SURGERY      T12 - L1  . CARDIAC CATHETERIZATION N/A 12/02/2015   Procedure: Left Heart Cath and Coronary Angiography;  Surgeon: Peter M Martinique, MD;  Location: [place] [facility];  Service: Cardiovascular;  Laterality: N/A;  . CARDIAC CATHETERIZATION N/A 12/02/2015   Procedure: Coronary Stent Intervention;  Surgeon: Peter M Martinique, MD;  Location: [place] [facility];  Service: Cardiovascular;  Laterality: N/A;  mid LAD Promus 2.5x12  . CORONARY ANGIOPLASTY     one stent placed by Dr. P Martinique  . HARDWARE REMOVAL Right 08/26/2017   Procedure: Right Lumbar Five Revision of pedicle screw with Removal of Lumbar Five Screw;  Surgeon: Kristeen Miss, MD;  Location: [facility];  Service: Neurosurgery;  Laterality: Right;  posterior  . HARDWARE REMOVAL Right 12/27/2017   Procedure: Right Lumbar Two, Lumbar Three, Lumbar Four Pedicle screw removal with metrex;  Surgeon: Kristeen Miss, MD;  Location: [place] [place];  Service: Neurosurgery;  Laterality: Right;  Right L2 to L4 Pedicle screw removal with mterex  . MASS EXCISION  06/25/2012   Procedure: EXCISION MASS;  Surgeon: Wynonia Sours, MD;  Location: [facility];  Service: Orthopedics;  Laterality: Left;  transection of NEUROMA, BURYING RADIAL NERVE IN BRACHIORADIALIS LEFT SIDE  . radial nerve Left  cut in work injury  . SPINAL FUSION     C6-7  . TONSILLECTOMY  1982  . VASECTOMY       Current Outpatient Medications  Medication Sig Dispense Refill  . acetaminophen (TYLENOL) 500 MG tablet Take 1,000 mg by mouth daily as needed for mild pain or headache.     [name] amLODipine (NORVASC) 5 MG tablet Take 1.5 tablets (7.5 mg total) by mouth daily. 135 tablet 3  . aspirin EC 81 MG tablet Take 81 mg by mouth at bedtime.    [name] atorvastatin (LIPITOR) 80 MG tablet Take 1 tablet (80 mg total) by mouth at bedtime. 90 tablet 2  . betamethasone dipropionate (DIPROLENE) 0.05 % cream APPLY TO AFFECTED AREA TWICE A DAY (Patient taking differently: Apply 1 application topically  daily as needed (hemorrhoids). ) 30 g 0  . diphenhydrAMINE (BENADRYL) 25 MG tablet Take 25 mg by mouth 2 (two) times daily as needed for itching.     . fluticasone (FLONASE) 50 MCG/ACT nasal spray SPRAY 2 SPRAYS INTO EACH NOSTRIL EVERY DAY (Patient taking differently: Place 2 sprays into both nostrils daily. ) 48 mL 1  . hydrochlorothiazide (HYDRODIURIL) 50 MG tablet Take 1 tablet (50 mg total) by mouth daily. 90 tablet 1  . HYDROcodone-acetaminophen (NORCO/VICODIN) 5-325 MG tablet Take 1 tablet by mouth every 6 (six) hours as needed. for pain    . ketoconazole (NIZORAL) 2 % cream Apply 1 application topically daily.    [name] levocetirizine (XYZAL) 5 MG tablet Take 1 tablet (5 mg total) by mouth every evening. 90 tablet 3  . levothyroxine (SYNTHROID) 100 MCG tablet Take 1 tablet (100 mcg total) by mouth daily. 90 tablet 2  . nitroGLYCERIN (NITROSTAT) 0.4 MG SL tablet Place 1 tablet (0.4 mg total) under the tongue every 5 (five) minutes as needed for chest pain. 20 tablet 2  . pantoprazole (PROTONIX) 40 MG tablet Take 1 tablet (40 mg total) by mouth daily. 90 tablet 3  . PARoxetine (PAXIL) 20 MG tablet Take 1 tablet (20 mg total) by mouth daily. (Patient taking differently: Take 20 mg by mouth every evening. ) 90 tablet 1  . EPINEPHrine 0.3 mg/0.3 mL IJ SOAJ injection Inject 0.3 mLs (0.3 mg total) into the muscle as needed for anaphylaxis. 1 Device 3   No current facility-administered medications for this visit.    Allergies:   Alpha-gal, Beef (bovine) protein, Beef-derived products, Lambs quarters, Pork-derived products, Prednisone, Brilinta [ticagrelor], Doxycycline, Lyrica [pregabalin], Methylprednisolone sodium succ, Penicillins, Shellfish allergy, Adhesive [tape], and Fentanyl    ROS:  Please see the history of present illness.   Otherwise, review of systems are positive for none.   All other systems are reviewed and negative.    PHYSICAL EXAM: VS:  BP 120/72   Pulse (!) 56   Ht 5\' 10"   (1.778 m)   Wt 201 lb (91.2 kg)   BMI 28.84 kg/m  , BMI Body mass index is 28.84 kg/m. GENERAL:  Well appearing NECK:  No jugular venous distention, waveform within normal limits, carotid upstroke brisk and symmetric, no bruits, no thyromegaly LUNGS:  Clear to auscultation bilaterally CHEST:  Unremarkable HEART:  PMI not displaced or sustained,S1 and S2 within normal limits, no S3, no S4, no clicks, no rubs, no murmurs ABD:  Flat, positive bowel sounds normal in frequency in pitch, no bruits, no rebound, no guarding, no midline pulsatile mass, no hepatomegaly, no splenomegaly EXT:  2 plus pulses throughout, no edema, no cyanosis no clubbing  EKG:  EKG is not ordered today.    Recent Labs: 09/16/2019: ALT 34; BUN 15; Creatinine, Ser 1.08; Hemoglobin 14.9; Platelets 236; Potassium 4.1; Sodium 144; TSH 2.070    Lipid Panel    Component Value Date/Time   CHOL 136 09/16/2019 1005   CHOL 136 10/29/2012 0922   TRIG 73 09/16/2019 1005   TRIG 139 04/28/2013 1505   TRIG 103 10/29/2012 0922   HDL 54 09/16/2019 1005   HDL 44 04/28/2013 1505   HDL 41 10/29/2012 0922   CHOLHDL 2.5 09/16/2019 1005   LDLCALC 67 09/16/2019 1005   LDLCALC 50 04/28/2013 1505   LDLCALC 74 10/29/2012 0922      Wt Readings from Last 3 Encounters:  10/21/19 201 lb (91.2 kg)  10/06/19 202 lb (91.6 kg)  09/17/19 201 lb (91.2 kg)      Other studies Reviewed: Additional studies/ records that were reviewed today include: None. Review of the above records demonstrates:  NA   ASSESSMENT AND PLAN:  CAD:  The patient has no new sypmtoms.  No further cardiovascular testing is indicated.  We will continue with aggressive risk reduction and meds as listed.[name] He had a low risk perfusion study.  No further testing.  Preop: The patient had a low risk perfusion study.  He is not going for high risk procedure.  No further cardiovascular testing is indicated.  According to ACC/AHA guidelines he is acceptable risk for the  planned procedure.  Essential hypertension:  The blood pressure is controlled.  No change in therapy.  Palpitations: He has no significant arrhythmia on monitor.  No change in therapy.   Dyslipidemia:  LDL this summer was 67.  No change in therapy.  Covid education: We talked again about the vaccine.  He is thinking of signing up.  Current medicines are reviewed at length with the patient today.  The patient does not have concerns regarding medicines.  The following changes have been made:  None Note Labs/ tests ordered today include: none  No orders of the defined types were placed in this encounter.    Disposition:   FU with me in 12 months    Signed, Minus Breeding, MD  10/21/2019 9:46 AM    [place]

## 2019-10-21 ENCOUNTER — Encounter: Payer: Self-pay | Admitting: Cardiology

## 2019-10-21 ENCOUNTER — Ambulatory Visit (INDEPENDENT_AMBULATORY_CARE_PROVIDER_SITE_OTHER): Payer: Medicare Other | Admitting: Cardiology

## 2019-10-21 ENCOUNTER — Other Ambulatory Visit: Payer: Self-pay

## 2019-10-21 VITALS — BP 120/72 | HR 56 | Ht 70.0 in | Wt 201.0 lb

## 2019-10-21 DIAGNOSIS — I2510 Atherosclerotic heart disease of native coronary artery without angina pectoris: Secondary | ICD-10-CM

## 2019-10-21 DIAGNOSIS — I25119 Atherosclerotic heart disease of native coronary artery with unspecified angina pectoris: Secondary | ICD-10-CM | POA: Diagnosis not present

## 2019-10-21 DIAGNOSIS — Z7189 Other specified counseling: Secondary | ICD-10-CM

## 2019-10-21 DIAGNOSIS — R002 Palpitations: Secondary | ICD-10-CM | POA: Diagnosis not present

## 2019-10-21 NOTE — Patient Instructions (Signed)
Medication Instructions:  The current medical regimen is effective;  continue present plan and medications.  *If you need a refill on your cardiac medications before your next appointment, please call your pharmacy*  Follow-Up: At [facility], you and your health needs are our priority.  As part of our continuing mission to provide you with exceptional heart care, we have created designated Provider Care Teams.  These Care Teams include your primary Cardiologist (physician) and Advanced Practice Providers (APPs -  Physician Assistants and Nurse Practitioners) who all work together to provide you with the care you need, when you need it.  We recommend signing up for the patient portal called "MyChart".  Sign up information is provided on this After Visit Summary.  MyChart is used to connect with patients for Virtual Visits (Telemedicine).  Patients are able to view lab/test results, encounter notes, upcoming appointments, etc.  Non-urgent messages can be sent to your provider as well.   To learn more about what you can do with MyChart, go to [email or web address].    Your next appointment:   12 month(s)  The format for your next appointment:   In Person  Provider:   James Hochrein, MD   Thank you for choosing [facility]!!     

## 2019-10-22 NOTE — Progress Notes (Signed)
[id number] [place] [place] [facility] [place] [address] [place] [address] Phone: [phone] Fax: [phone]      Your procedure is scheduled on Tuesday, 10/27/19.  Report to [facility] Entrance "A" at 10:20 am A.M., and check in at the Admitting office.  Call this number if you have problems the morning of surgery:  [phone]  Call [phone] if you have any questions prior to your surgery date Monday-Friday 8am-4pm    Remember:  Do not eat or drink after midnight the night before your surgery - Monday   Take these medicines the morning of surgery with A SIP OF WATER: amLODipine (NORVASC)  fluticasone (FLONASE)  levothyroxine (SYNTHROID)  pantoprazole (PROTONIX) nitroGLYCERIN (NITROSTAT) if needed HYDROcodone-acetaminophen (NORCO/VICODIN) or tylenol if needed  STOP now taking any Aspirin (unless otherwise instructed by your surgeon), Aleve, Naproxen, Ibuprofen, Motrin, Advil, Goody's, BC's, all herbal medications, fish oil, and all vitamins.    The Morning of Surgery  Do not wear jewelry.  Do not wear lotions, powders,  colognes, or deodorant  Men may shave face and neck.  Do not bring valuables to the hospital.  [facility] is not responsible for any belongings or valuables.  If you are a smoker, DO NOT Smoke 24 hours prior to surgery  If you wear a CPAP at night please bring your mask the morning of surgery   Remember that you must have someone to transport you home after your surgery, and remain with you for 24 hours if you are discharged the same day.  Please bring cases for contacts, glasses, hearing aids, dentures or bridgework because it cannot be worn into surgery.   Leave your suitcase in the car.  After surgery it may be brought to your room.  For patients admitted to the hospital, discharge time will be determined by your treatment team.  Patients discharged the day of surgery will not be allowed to drive home.     Special instructions:   [facility]- Preparing For Surgery  Before surgery, you can play an important role. Because skin is not sterile, your skin needs to be as free of germs as possible. You can reduce the number of germs on your skin by washing with CHG (chlorahexidine gluconate) Soap before surgery.  CHG is an antiseptic cleaner which kills germs and bonds with the skin to continue killing germs even after washing.    Oral Hygiene is also important to reduce your risk of infection.  Remember - BRUSH YOUR TEETH THE MORNING OF SURGERY WITH YOUR REGULAR TOOTHPASTE  Please do not use if you have an allergy to CHG or antibacterial soaps. If your skin becomes reddened/irritated stop using the CHG.  Do not shave (including legs and underarms) for at least 48 hours prior to first CHG shower. It is OK to shave your face.  Please follow these instructions carefully.   1. Shower the NIGHT BEFORE SURGERY-Mon and the MORNING OF SURGERY-Tues with CHG Soap.   2. If you chose to wash your hair, wash your hair first as usual with your normal shampoo.  3. After you shampoo, rinse your hair and body thoroughly to remove the shampoo.  4. Use CHG as you would any other liquid soap. You can apply CHG directly to the skin and wash gently with a scrungie or a clean washcloth.   5. Apply the CHG Soap to your body ONLY FROM THE NECK DOWN.  Do not use on open wounds or  open sores. Avoid contact with your eyes, ears, mouth and genitals (private parts). Wash Face and genitals (private parts)  with your normal soap.   6. Wash thoroughly, paying special attention to the area where your surgery will be performed.  7. Thoroughly rinse your body with warm water from the neck down.  8. DO NOT shower/wash with your normal soap after using and rinsing off the CHG Soap.  9. Pat yourself dry with a CLEAN TOWEL.  10. Wear CLEAN PAJAMAS to bed the night before surgery, wear comfortable clothes the morning of  surgery  11. Place CLEAN SHEETS on your bed the night of your first shower and DO NOT SLEEP WITH PETS.    Day of Surgery:  Please shower the morning of surgery with the CHG soap Do not apply any deodorants/lotions. Please wear clean clothes to the hospital/surgery center.   Remember to brush your teeth WITH YOUR REGULAR TOOTHPASTE.   Please read over the following fact sheets that you were given.

## 2019-10-23 ENCOUNTER — Ambulatory Visit: Payer: BLUE CROSS/BLUE SHIELD | Admitting: Cardiology

## 2019-10-23 ENCOUNTER — Other Ambulatory Visit (HOSPITAL_COMMUNITY)
Admission: RE | Admit: 2019-10-23 | Discharge: 2019-10-23 | Disposition: A | Discharge status: Home / Self Care | Payer: Medicare Other | Source: Ambulatory Visit | Attending: Neurological Surgery | Admitting: Neurological Surgery

## 2019-10-23 ENCOUNTER — Other Ambulatory Visit: Payer: Self-pay

## 2019-10-23 ENCOUNTER — Encounter (HOSPITAL_COMMUNITY): Payer: Self-pay

## 2019-10-23 ENCOUNTER — Encounter (HOSPITAL_COMMUNITY)
Admission: RE | Admit: 2019-10-23 | Discharge: 2019-10-23 | Disposition: A | Discharge status: Home / Self Care | Payer: Medicare Other | Source: Ambulatory Visit | Attending: Neurological Surgery | Admitting: Neurological Surgery

## 2019-10-23 DIAGNOSIS — I10 Essential (primary) hypertension: Secondary | ICD-10-CM | POA: Insufficient documentation

## 2019-10-23 DIAGNOSIS — Z20822 Contact with and (suspected) exposure to covid-19: Secondary | ICD-10-CM | POA: Diagnosis not present

## 2019-10-23 DIAGNOSIS — Z01818 Encounter for other preprocedural examination: Secondary | ICD-10-CM | POA: Insufficient documentation

## 2019-10-23 DIAGNOSIS — Z955 Presence of coronary angioplasty implant and graft: Secondary | ICD-10-CM | POA: Diagnosis not present

## 2019-10-23 DIAGNOSIS — I2510 Atherosclerotic heart disease of native coronary artery without angina pectoris: Secondary | ICD-10-CM | POA: Diagnosis not present

## 2019-10-23 DIAGNOSIS — E785 Hyperlipidemia, unspecified: Secondary | ICD-10-CM | POA: Diagnosis not present

## 2019-10-23 LAB — BASIC METABOLIC PANEL
Anion gap: 12 (ref 5–15)
BUN: 10 mg/dL (ref 6–20)
CO2: 29 mmol/L (ref 22–32)
Calcium: 9.3 mg/dL (ref 8.9–10.3)
Chloride: 101 mmol/L (ref 98–111)
Creatinine, Ser: 0.77 mg/dL (ref 0.61–1.24)
GFR calc Af Amer: >60 mL/min (ref >60)
GFR calc non Af Amer: >60 mL/min (ref >60)
Glucose, Bld: 107 mg/dL — ABNORMAL HIGH (ref 70–99)
Potassium: 3.2 mmol/L — ABNORMAL LOW (ref 3.5–5.1)
Sodium: 142 mmol/L (ref 135–145)

## 2019-10-23 LAB — TYPE AND SCREEN
ABO/RH(D): A POS
Antibody Screen: NEGATIVE

## 2019-10-23 LAB — CBC
HCT: 45.7 % (ref 39.0–52.0)
Hemoglobin: 15.5 g/dL (ref 13.0–17.0)
MCH: 29.3 pg (ref 26.0–34.0)
MCHC: 33.9 g/dL (ref 30.0–36.0)
MCV: 86.4 fL (ref 80.0–100.0)
Platelets: 254 10*3/uL (ref 150–400)
RBC: 5.29 MIL/uL (ref 4.22–5.81)
RDW: 13.8 % (ref 11.5–15.5)
WBC: 7.7 10*3/uL (ref 4.0–10.5)
nRBC: 0 % (ref 0.0–0.2)

## 2019-10-23 LAB — SURGICAL PCR SCREEN
MRSA, PCR: NEGATIVE
Staphylococcus aureus: NEGATIVE

## 2019-10-23 LAB — SARS CORONAVIRUS 2 (TAT 6-24 HRS): SARS Coronavirus 2: NEGATIVE

## 2019-10-23 NOTE — Progress Notes (Addendum)
PCP: Darla Lesches FNP, but she is leaving soon so will be switching to other NP in the practice soon Cardiologist: Dr. Abelina Bachelor note in [place] 10/21/19  EKG: 09/11/2019 CXR: n/a ECHO: 11/25/2015 Stress Test: 09/17/2019 Cardiac Cath: 12/02/2015  Last Dose ASA: 10/20/2019  Going for Covid Test today after PAT appt  PCR swab pending, treat with betadine DOS if needed--states betadine is ok even with shellfish allergy  Called office and left message for Linton Hall regarding order for outside vendor brace.  Pt did not receive any instructions to pick up brace to bring DOS--has an old one from previous surgery, states he will plan to bring "just in case".  Awaiting call from office for further instructions  Patient denies shortness of breath, fever, cough, and chest pain at PAT appointment.  Patient verbalized understanding of instructions provided today at the PAT appointment.  Patient asked to review instructions at home and day of surgery.   Forward to anesthesia: hx CAD  Addendum: Received return call from office: Pt will receive brace once admitted.  No further action required for pt--pt made aware.

## 2019-10-26 NOTE — Progress Notes (Signed)
Anesthesia Chart Review:  Follows with cardiology for history of palpitations, hypertension, dyslipidemia, CAD: Cath 12/02/15 revealed single vessel obstructive CAD involving the mid LAD. He underwent successful PCI with a mid LAD DES 4/17. Seen by Dr. [name] 10/21/2019 for preop clearance. Per note, "CAD:  The patient has no new sypmtoms.  No further cardiovascular testing is indicated.  We will continue with aggressive risk reduction and meds as listed.[name] He had a low risk perfusion study.  No further testing. Preop: The patient had a low risk perfusion study.  He is not going for high risk procedure.  No further cardiovascular testing is indicated.  According to ACC/AHA guidelines he is acceptable risk for the planned procedure."   Preop labs reviewed, potassium mildly low at 3.2. Otherwise unremarkable.  EKG 09/11/2019:  NSR. Rate 64  Event monitor 09/30/2019: NSR Rare PVCs and SVEs One 4 beat SVT run.   No sustained arrhythmias.   Nuclear stress 09/17/2019:  The left ventricular ejection fraction is hyperdynamic (>65%).  Nuclear stress EF: 73%.  There was no ST segment deviation noted during stress.  Defect 1: There is a small defect of mild severity present in the apical inferior location.  This is a low risk study. No ischemia identified.   Cath and PCI 11/24/2015:  Prox RCA to Mid RCA lesion, 30% stenosed.  Mid LAD-1 lesion, 40% stenosed.  Mid LAD-2 lesion, 95% stenosed. Post intervention, there is a 15% residual stenosis.   1. Single vessel obstructive CAD involving the mid LAD 2. Successful stenting of the Mid LAD with DES.  Plan: DAPT for one year. Anticipate DC in am.  Echo 11/25/2015: - Left ventricle: The cavity size was normal. Systolic function was  normal. The estimated ejection fraction was in the range of 60%  to 65%. Wall motion was normal; there were no regional wall  motion abnormalities.  - Aortic valve: There was trivial regurgitation.  - Mitral  valve: There was mild regurgitation.  - Left atrium: The atrium was mildly dilated.   Wynonia Musty [facility] Short Stay Center/Anesthesiology Phone [phone] 10/26/2019 8:59 AM

## 2019-10-26 NOTE — Anesthesia Preprocedure Evaluation (Addendum)
Anesthesia Evaluation  Patient identified by MRN, date of birth, ID band Patient awake    Reviewed: Allergy & Precautions, NPO status , Patient's Chart, lab work & pertinent test results  Airway Mallampati: III  TM Distance: >3 FB Neck ROM: Full    Dental no notable dental hx.    Pulmonary former smoker,    Pulmonary exam normal breath sounds clear to auscultation       Cardiovascular hypertension, Pt. on medications + angina + CAD and + Cardiac Stents  Normal cardiovascular exam Rhythm:Regular Rate:Normal  ECG: NSR, rate 64  Myocardial perfusion: The left ventricular ejection fraction is hyperdynamic (>65%). Nuclear stress EF: 73%. There was no ST segment deviation noted during stress. Defect 1: There is a small defect of mild severity present in the apical inferior location. This is a low risk study. No ischemia identified.   Neuro/Psych PSYCHIATRIC DISORDERS Anxiety negative neurological ROS     GI/Hepatic Neg liver ROS, GERD  Medicated and Controlled,  Endo/Other  Hypothyroidism   Renal/GU negative Renal ROS     Musculoskeletal negative musculoskeletal ROS (+)   Abdominal   Peds  Hematology HLD   Anesthesia Other Findings Spinal stenosis of lumbar region with radiculopathy  Reproductive/Obstetrics                          Anesthesia Physical Anesthesia Plan  ASA: IV  Anesthesia Plan: General   Post-op Pain Management:    Induction: Intravenous  PONV Risk Score and Plan: 3 and Midazolam, Dexamethasone, Ondansetron and Treatment may vary due to age or medical condition  Airway Management Planned: Oral ETT  Additional Equipment:   Intra-op Plan:   Post-operative Plan: Extubation in OR  Informed Consent: I have reviewed the patients History and Physical, chart, labs and discussed the procedure including the risks, benefits and alternatives for the proposed anesthesia with  the patient or authorized representative who has indicated his/her understanding and acceptance.     Dental advisory given  Plan Discussed with: CRNA  Anesthesia Plan Comments: (Per PA-C: Follows with cardiology for history of palpitations, hypertension, dyslipidemia, CAD: Cath 12/02/15 revealed single vessel obstructive CAD involving the mid LAD. He underwent successful PCI with a mid LAD DES 4/17. Seen by Dr. [name] 10/21/2019 for preop clearance. Per note, "CAD:  The patient has no new sypmtoms.  No further cardiovascular testing is indicated.  We will continue with aggressive risk reduction and meds as listed.[name] He had a low risk perfusion study.  No further testing. Preop: The patient had a low risk perfusion study.  He is not going for high risk procedure.  No further cardiovascular testing is indicated.  According to ACC/AHA guidelines he is acceptable risk for the planned procedure."   Preop labs reviewed, potassium mildly low at 3.2. Otherwise unremarkable.  EKG 09/11/2019:  NSR. Rate 64  Event monitor 09/30/2019: NSR Rare PVCs and SVEs One 4 beat SVT run.   No sustained arrhythmias.   Nuclear stress 09/17/2019: The left ventricular ejection fraction is hyperdynamic (>65%). Nuclear stress EF: 73%. There was no ST segment deviation noted during stress. Defect 1: There is a small defect of mild severity present in the apical inferior location. This is a low risk study. No ischemia identified.   Cath and PCI 11/24/2015: Prox RCA to Mid RCA lesion, 30% stenosed. Mid LAD-1 lesion, 40% stenosed. Mid LAD-2 lesion, 95% stenosed. Post intervention, there is a 15% residual stenosis.   1. Single  vessel obstructive CAD involving the mid LAD 2. Successful stenting of the Mid LAD with DES.  Plan: DAPT for one year. Anticipate DC in am.  Echo 11/25/2015: - Left ventricle: The cavity size was normal. Systolic function was  normal. The estimated ejection fraction was in the range of 60%   to 65%. Wall motion was normal; there were no regional wall  motion abnormalities.  - Aortic valve: There was trivial regurgitation.  - Mitral valve: There was mild regurgitation.  - Left atrium: The atrium was mildly dilated.  )     Anesthesia Quick Evaluation

## 2019-10-27 ENCOUNTER — Encounter (HOSPITAL_COMMUNITY): Payer: Self-pay | Admitting: Neurological Surgery

## 2019-10-27 ENCOUNTER — Inpatient Hospital Stay (HOSPITAL_COMMUNITY): Payer: Medicare Other

## 2019-10-27 ENCOUNTER — Inpatient Hospital Stay (HOSPITAL_COMMUNITY)
Admission: RE | Disposition: A | Discharge status: Home / Self Care | Payer: Self-pay | Source: Home / Self Care | Attending: Neurological Surgery

## 2019-10-27 ENCOUNTER — Other Ambulatory Visit: Payer: Self-pay

## 2019-10-27 ENCOUNTER — Inpatient Hospital Stay (HOSPITAL_COMMUNITY): Payer: Medicare Other | Admitting: Physician Assistant

## 2019-10-27 ENCOUNTER — Inpatient Hospital Stay (HOSPITAL_COMMUNITY): Payer: Medicare Other | Admitting: Anesthesiology

## 2019-10-27 ENCOUNTER — Inpatient Hospital Stay (HOSPITAL_COMMUNITY)
Admission: RE | Admit: 2019-10-27 | Discharge: 2019-10-29 | DRG: 455 | Disposition: A | Discharge status: Home / Self Care | Payer: Medicare Other | Attending: Neurological Surgery | Admitting: Neurological Surgery

## 2019-10-27 DIAGNOSIS — M4326 Fusion of spine, lumbar region: Secondary | ICD-10-CM | POA: Diagnosis not present

## 2019-10-27 DIAGNOSIS — E785 Hyperlipidemia, unspecified: Secondary | ICD-10-CM | POA: Diagnosis not present

## 2019-10-27 DIAGNOSIS — E039 Hypothyroidism, unspecified: Secondary | ICD-10-CM | POA: Diagnosis present

## 2019-10-27 DIAGNOSIS — M48061 Spinal stenosis, lumbar region without neurogenic claudication: Secondary | ICD-10-CM | POA: Diagnosis not present

## 2019-10-27 DIAGNOSIS — Z79899 Other long term (current) drug therapy: Secondary | ICD-10-CM

## 2019-10-27 DIAGNOSIS — K219 Gastro-esophageal reflux disease without esophagitis: Secondary | ICD-10-CM | POA: Diagnosis not present

## 2019-10-27 DIAGNOSIS — Z91013 Allergy to seafood: Secondary | ICD-10-CM

## 2019-10-27 DIAGNOSIS — I10 Essential (primary) hypertension: Secondary | ICD-10-CM | POA: Diagnosis not present

## 2019-10-27 DIAGNOSIS — M47817 Spondylosis without myelopathy or radiculopathy, lumbosacral region: Principal | ICD-10-CM | POA: Diagnosis present

## 2019-10-27 DIAGNOSIS — Z419 Encounter for procedure for purposes other than remedying health state, unspecified: Secondary | ICD-10-CM

## 2019-10-27 DIAGNOSIS — Z7982 Long term (current) use of aspirin: Secondary | ICD-10-CM | POA: Diagnosis not present

## 2019-10-27 DIAGNOSIS — F419 Anxiety disorder, unspecified: Secondary | ICD-10-CM | POA: Diagnosis not present

## 2019-10-27 DIAGNOSIS — M5416 Radiculopathy, lumbar region: Secondary | ICD-10-CM | POA: Diagnosis present

## 2019-10-27 DIAGNOSIS — Z981 Arthrodesis status: Secondary | ICD-10-CM

## 2019-10-27 DIAGNOSIS — E782 Mixed hyperlipidemia: Secondary | ICD-10-CM | POA: Diagnosis not present

## 2019-10-27 DIAGNOSIS — Z888 Allergy status to other drugs, medicaments and biological substances status: Secondary | ICD-10-CM | POA: Diagnosis not present

## 2019-10-27 DIAGNOSIS — Z880 Allergy status to penicillin: Secondary | ICD-10-CM | POA: Diagnosis not present

## 2019-10-27 DIAGNOSIS — I2510 Atherosclerotic heart disease of native coronary artery without angina pectoris: Secondary | ICD-10-CM | POA: Diagnosis present

## 2019-10-27 DIAGNOSIS — M7130 Other bursal cyst, unspecified site: Secondary | ICD-10-CM | POA: Diagnosis present

## 2019-10-27 DIAGNOSIS — Z9852 Vasectomy status: Secondary | ICD-10-CM

## 2019-10-27 DIAGNOSIS — M79604 Pain in right leg: Secondary | ICD-10-CM | POA: Diagnosis not present

## 2019-10-27 DIAGNOSIS — Z8249 Family history of ischemic heart disease and other diseases of the circulatory system: Secondary | ICD-10-CM

## 2019-10-27 DIAGNOSIS — M1990 Unspecified osteoarthritis, unspecified site: Secondary | ICD-10-CM | POA: Diagnosis present

## 2019-10-27 DIAGNOSIS — M5418 Radiculopathy, sacral and sacrococcygeal region: Secondary | ICD-10-CM | POA: Diagnosis not present

## 2019-10-27 DIAGNOSIS — M5417 Radiculopathy, lumbosacral region: Secondary | ICD-10-CM | POA: Diagnosis not present

## 2019-10-27 DIAGNOSIS — Z9109 Other allergy status, other than to drugs and biological substances: Secondary | ICD-10-CM | POA: Diagnosis not present

## 2019-10-27 DIAGNOSIS — Z79890 Hormone replacement therapy (postmenopausal): Secondary | ICD-10-CM | POA: Diagnosis not present

## 2019-10-27 DIAGNOSIS — Z91018 Allergy to other foods: Secondary | ICD-10-CM | POA: Diagnosis not present

## 2019-10-27 DIAGNOSIS — G709 Myoneural disorder, unspecified: Secondary | ICD-10-CM | POA: Diagnosis not present

## 2019-10-27 DIAGNOSIS — M4807 Spinal stenosis, lumbosacral region: Secondary | ICD-10-CM | POA: Diagnosis present

## 2019-10-27 DIAGNOSIS — M7138 Other bursal cyst, other site: Secondary | ICD-10-CM | POA: Diagnosis not present

## 2019-10-27 DIAGNOSIS — Z87891 Personal history of nicotine dependence: Secondary | ICD-10-CM | POA: Diagnosis not present

## 2019-10-27 DIAGNOSIS — M549 Dorsalgia, unspecified: Secondary | ICD-10-CM | POA: Diagnosis not present

## 2019-10-27 SURGERY — POSTERIOR LUMBAR FUSION 1 LEVEL
Anesthesia: General | Site: Spine Lumbar

## 2019-10-27 MED ORDER — SODIUM CHLORIDE 0.9 % IV SOLN: 250.0000 mL | INTRAVENOUS | Status: DC

## 2019-10-27 MED ORDER — ALBUMIN HUMAN 5 % IV SOLN: INTRAVENOUS | Status: DC | PRN

## 2019-10-27 MED ORDER — ROCURONIUM BROMIDE 10 MG/ML (PF) SYRINGE
PREFILLED_SYRINGE | INTRAVENOUS | Status: AC
  Filled 2019-10-27: qty 10

## 2019-10-27 MED ORDER — PHENOL 1.4 % MT LIQD: 1.0000 | OROMUCOSAL | Status: DC | PRN

## 2019-10-27 MED ORDER — KETOROLAC TROMETHAMINE 15 MG/ML IJ SOLN
15.0000 mg | Freq: Four times a day (QID) | INTRAMUSCULAR | Status: AC
  Administered 2019-10-27 – 2019-10-28 (×4): 15 mg via INTRAVENOUS
  Filled 2019-10-27 (×2): qty 1

## 2019-10-27 MED ORDER — VANCOMYCIN HCL IN DEXTROSE 1-5 GM/200ML-% IV SOLN: 1000.0000 mg | INTRAVENOUS | Status: AC

## 2019-10-27 MED ORDER — OXYCODONE HCL 5 MG PO TABS
5.0000 mg | ORAL_TABLET | Freq: Once | ORAL | Status: AC | PRN
  Administered 2019-10-27: 5 mg via ORAL

## 2019-10-27 MED ORDER — FLEET ENEMA 7-19 GM/118ML RE ENEM: 1.0000 | ENEMA | Freq: Once | RECTAL | Status: DC | PRN

## 2019-10-27 MED ORDER — SUCCINYLCHOLINE CHLORIDE 200 MG/10ML IV SOSY
PREFILLED_SYRINGE | INTRAVENOUS | Status: AC
  Filled 2019-10-27: qty 10

## 2019-10-27 MED ORDER — PROPOFOL 10 MG/ML IV BOLUS
INTRAVENOUS | Status: DC | PRN
  Administered 2019-10-27: 150 mg via INTRAVENOUS
  Administered 2019-10-27: 20 mg via INTRAVENOUS

## 2019-10-27 MED ORDER — SODIUM CHLORIDE 0.9% FLUSH: 3.0000 mL | Freq: Two times a day (BID) | INTRAVENOUS | Status: DC

## 2019-10-27 MED ORDER — CHLORHEXIDINE GLUCONATE CLOTH 2 % EX PADS: 6.0000 | MEDICATED_PAD | Freq: Once | CUTANEOUS | Status: DC

## 2019-10-27 MED ORDER — BUPIVACAINE HCL (PF) 0.5 % IJ SOLN
INTRAMUSCULAR | Status: AC
  Filled 2019-10-27: qty 30

## 2019-10-27 MED ORDER — HYDROMORPHONE HCL 1 MG/ML IJ SOLN
INTRAMUSCULAR | Status: AC
  Filled 2019-10-27: qty 1

## 2019-10-27 MED ORDER — MIDAZOLAM HCL 5 MG/5ML IJ SOLN
INTRAMUSCULAR | Status: DC | PRN
  Administered 2019-10-27: 2 mg via INTRAVENOUS

## 2019-10-27 MED ORDER — NITROGLYCERIN 0.4 MG SL SUBL: 0.4000 mg | SUBLINGUAL_TABLET | SUBLINGUAL | Status: DC | PRN

## 2019-10-27 MED ORDER — BISACODYL 10 MG RE SUPP: 10.0000 mg | Freq: Every day | RECTAL | Status: DC | PRN

## 2019-10-27 MED ORDER — ACETAMINOPHEN 325 MG PO TABS: 650.0000 mg | ORAL_TABLET | ORAL | Status: DC | PRN

## 2019-10-27 MED ORDER — EPHEDRINE 5 MG/ML INJ
INTRAVENOUS | Status: AC
  Filled 2019-10-27: qty 20

## 2019-10-27 MED ORDER — OXYCODONE-ACETAMINOPHEN 5-325 MG PO TABS
1.0000 | ORAL_TABLET | ORAL | Status: DC | PRN
  Administered 2019-10-27 – 2019-10-29 (×10): 2 via ORAL
  Filled 2019-10-27 (×10): qty 2

## 2019-10-27 MED ORDER — THROMBIN 5000 UNITS EX SOLR
OROMUCOSAL | Status: DC | PRN
  Administered 2019-10-27: 5 mL via TOPICAL

## 2019-10-27 MED ORDER — SUGAMMADEX SODIUM 200 MG/2ML IV SOLN
INTRAVENOUS | Status: DC | PRN
  Administered 2019-10-27: 200 mg via INTRAVENOUS

## 2019-10-27 MED ORDER — ALUM & MAG HYDROXIDE-SIMETH 200-200-20 MG/5ML PO SUSP: 30.0000 mL | Freq: Four times a day (QID) | ORAL | Status: DC | PRN

## 2019-10-27 MED ORDER — SODIUM CHLORIDE 0.9 % IV SOLN
INTRAVENOUS | Status: DC | PRN
  Administered 2019-10-27: 500 mL

## 2019-10-27 MED ORDER — LEVOTHYROXINE SODIUM 100 MCG PO TABS
100.0000 ug | ORAL_TABLET | Freq: Every day | ORAL | Status: DC
  Administered 2019-10-28 – 2019-10-29 (×2): 100 ug via ORAL
  Filled 2019-10-27 (×2): qty 1

## 2019-10-27 MED ORDER — MIDAZOLAM HCL 2 MG/2ML IJ SOLN
INTRAMUSCULAR | Status: AC
  Filled 2019-10-27: qty 2

## 2019-10-27 MED ORDER — AMLODIPINE BESYLATE 5 MG PO TABS
7.5000 mg | ORAL_TABLET | Freq: Every day | ORAL | Status: DC
  Administered 2019-10-28 – 2019-10-29 (×2): 7.5 mg via ORAL
  Filled 2019-10-27 (×2): qty 2

## 2019-10-27 MED ORDER — ACETAMINOPHEN 500 MG PO TABS: 1000.0000 mg | ORAL_TABLET | Freq: Once | ORAL | Status: AC

## 2019-10-27 MED ORDER — THROMBIN (RECOMBINANT) 5000 UNITS EX SOLR
CUTANEOUS | Status: AC
  Filled 2019-10-27: qty 5000

## 2019-10-27 MED ORDER — FENTANYL CITRATE (PF) 100 MCG/2ML IJ SOLN
INTRAMUSCULAR | Status: DC | PRN
  Administered 2019-10-27 (×3): 50 ug via INTRAVENOUS
  Administered 2019-10-27: 25 ug via INTRAVENOUS

## 2019-10-27 MED ORDER — ONDANSETRON HCL 4 MG/2ML IJ SOLN
INTRAMUSCULAR | Status: DC | PRN
  Administered 2019-10-27: 4 mg via INTRAVENOUS

## 2019-10-27 MED ORDER — OXYCODONE HCL 5 MG/5ML PO SOLN: 5.0000 mg | Freq: Once | ORAL | Status: AC | PRN

## 2019-10-27 MED ORDER — METHOCARBAMOL 1000 MG/10ML IJ SOLN
500.0000 mg | Freq: Four times a day (QID) | INTRAVENOUS | Status: DC | PRN
  Filled 2019-10-27: qty 5

## 2019-10-27 MED ORDER — OXYCODONE HCL 5 MG PO TABS
ORAL_TABLET | ORAL | Status: AC
  Filled 2019-10-27: qty 1

## 2019-10-27 MED ORDER — LIDOCAINE-EPINEPHRINE 1 %-1:100000 IJ SOLN
INTRAMUSCULAR | Status: DC | PRN
  Administered 2019-10-27: 5 mL

## 2019-10-27 MED ORDER — FLUMAZENIL 0.5 MG/5ML IV SOLN
INTRAVENOUS | Status: AC
  Filled 2019-10-27: qty 5

## 2019-10-27 MED ORDER — SODIUM CHLORIDE 0.9% FLUSH: 3.0000 mL | INTRAVENOUS | Status: DC | PRN

## 2019-10-27 MED ORDER — LACTATED RINGERS IV SOLN: INTRAVENOUS | Status: DC | PRN

## 2019-10-27 MED ORDER — KETOROLAC TROMETHAMINE 15 MG/ML IJ SOLN
INTRAMUSCULAR | Status: AC
  Filled 2019-10-27: qty 1

## 2019-10-27 MED ORDER — LIDOCAINE-EPINEPHRINE 1 %-1:100000 IJ SOLN
INTRAMUSCULAR | Status: AC
  Filled 2019-10-27: qty 1

## 2019-10-27 MED ORDER — FLUTICASONE PROPIONATE 50 MCG/ACT NA SUSP
2.0000 | Freq: Every day | NASAL | Status: DC
  Administered 2019-10-28 – 2019-10-29 (×2): 2 via NASAL
  Filled 2019-10-27: qty 16

## 2019-10-27 MED ORDER — PHENYLEPHRINE 40 MCG/ML (10ML) SYRINGE FOR IV PUSH (FOR BLOOD PRESSURE SUPPORT)
PREFILLED_SYRINGE | INTRAVENOUS | Status: AC
  Filled 2019-10-27: qty 10

## 2019-10-27 MED ORDER — CEFAZOLIN SODIUM-DEXTROSE 2-4 GM/100ML-% IV SOLN
2.0000 g | Freq: Three times a day (TID) | INTRAVENOUS | Status: AC
  Administered 2019-10-27 – 2019-10-28 (×2): 2 g via INTRAVENOUS
  Filled 2019-10-27 (×2): qty 100

## 2019-10-27 MED ORDER — LIDOCAINE 2% (20 MG/ML) 5 ML SYRINGE
INTRAMUSCULAR | Status: AC
  Filled 2019-10-27: qty 5

## 2019-10-27 MED ORDER — KETOROLAC TROMETHAMINE 30 MG/ML IJ SOLN
INTRAMUSCULAR | Status: AC
  Filled 2019-10-27: qty 1

## 2019-10-27 MED ORDER — ROCURONIUM BROMIDE 100 MG/10ML IV SOLN
INTRAVENOUS | Status: DC | PRN
  Administered 2019-10-27: 10 mg via INTRAVENOUS
  Administered 2019-10-27: 20 mg via INTRAVENOUS
  Administered 2019-10-27: 10 mg via INTRAVENOUS
  Administered 2019-10-27: 60 mg via INTRAVENOUS
  Administered 2019-10-27: 20 mg via INTRAVENOUS

## 2019-10-27 MED ORDER — ACETAMINOPHEN 500 MG PO TABS
ORAL_TABLET | ORAL | Status: AC
  Administered 2019-10-27: 1000 mg via ORAL
  Filled 2019-10-27: qty 2

## 2019-10-27 MED ORDER — METHOCARBAMOL 500 MG PO TABS
500.0000 mg | ORAL_TABLET | Freq: Four times a day (QID) | ORAL | Status: DC | PRN
  Administered 2019-10-27 – 2019-10-29 (×6): 500 mg via ORAL
  Filled 2019-10-27 (×5): qty 1

## 2019-10-27 MED ORDER — MENTHOL 3 MG MT LOZG: 1.0000 | LOZENGE | OROMUCOSAL | Status: DC | PRN

## 2019-10-27 MED ORDER — PROMETHAZINE HCL 25 MG/ML IJ SOLN
INTRAMUSCULAR | Status: AC
  Filled 2019-10-27: qty 1

## 2019-10-27 MED ORDER — EPINEPHRINE 0.3 MG/0.3ML IJ SOAJ: 0.3000 mg | INTRAMUSCULAR | Status: DC | PRN

## 2019-10-27 MED ORDER — ACETAMINOPHEN 500 MG PO TABS: 1000.0000 mg | ORAL_TABLET | Freq: Every day | ORAL | Status: DC | PRN

## 2019-10-27 MED ORDER — ATORVASTATIN CALCIUM 80 MG PO TABS
80.0000 mg | ORAL_TABLET | Freq: Every day | ORAL | Status: DC
  Administered 2019-10-27 – 2019-10-28 (×2): 80 mg via ORAL
  Filled 2019-10-27 (×2): qty 1

## 2019-10-27 MED ORDER — DIPHENHYDRAMINE HCL 25 MG PO CAPS
25.0000 mg | ORAL_CAPSULE | Freq: Two times a day (BID) | ORAL | Status: DC | PRN
  Administered 2019-10-29: 25 mg via ORAL
  Filled 2019-10-27: qty 1

## 2019-10-27 MED ORDER — PHENYLEPHRINE HCL-NACL 10-0.9 MG/250ML-% IV SOLN
INTRAVENOUS | Status: DC | PRN
  Administered 2019-10-27: 50 ug/min via INTRAVENOUS

## 2019-10-27 MED ORDER — ONDANSETRON HCL 4 MG/2ML IJ SOLN
INTRAMUSCULAR | Status: AC
  Filled 2019-10-27: qty 6

## 2019-10-27 MED ORDER — ACETAMINOPHEN 650 MG RE SUPP: 650.0000 mg | RECTAL | Status: DC | PRN

## 2019-10-27 MED ORDER — VANCOMYCIN HCL IN DEXTROSE 1-5 GM/200ML-% IV SOLN
INTRAVENOUS | Status: AC
  Administered 2019-10-27: 1000 mg via INTRAVENOUS
  Filled 2019-10-27: qty 200

## 2019-10-27 MED ORDER — LACTATED RINGERS IV SOLN: Freq: Once | INTRAVENOUS | Status: AC

## 2019-10-27 MED ORDER — PROPOFOL 10 MG/ML IV BOLUS
INTRAVENOUS | Status: AC
  Filled 2019-10-27: qty 20

## 2019-10-27 MED ORDER — ONDANSETRON HCL 4 MG PO TABS: 4.0000 mg | ORAL_TABLET | Freq: Four times a day (QID) | ORAL | Status: DC | PRN

## 2019-10-27 MED ORDER — DEXAMETHASONE SODIUM PHOSPHATE 10 MG/ML IJ SOLN
INTRAMUSCULAR | Status: AC
  Filled 2019-10-27: qty 1

## 2019-10-27 MED ORDER — PANTOPRAZOLE SODIUM 40 MG PO TBEC
40.0000 mg | DELAYED_RELEASE_TABLET | Freq: Every day | ORAL | Status: DC
  Administered 2019-10-28 – 2019-10-29 (×2): 40 mg via ORAL
  Filled 2019-10-27 (×2): qty 1

## 2019-10-27 MED ORDER — SENNA 8.6 MG PO TABS
1.0000 | ORAL_TABLET | Freq: Two times a day (BID) | ORAL | Status: DC
  Administered 2019-10-27 – 2019-10-29 (×4): 8.6 mg via ORAL
  Filled 2019-10-27 (×3): qty 1

## 2019-10-27 MED ORDER — PROMETHAZINE HCL 25 MG/ML IJ SOLN
6.2500 mg | INTRAMUSCULAR | Status: DC | PRN
  Administered 2019-10-27: 6.25 mg via INTRAVENOUS

## 2019-10-27 MED ORDER — ARTIFICIAL TEARS OPHTHALMIC OINT
TOPICAL_OINTMENT | OPHTHALMIC | Status: AC
  Filled 2019-10-27: qty 10.5

## 2019-10-27 MED ORDER — METHOCARBAMOL 500 MG PO TABS
ORAL_TABLET | ORAL | Status: AC
  Filled 2019-10-27: qty 1

## 2019-10-27 MED ORDER — PAROXETINE HCL 20 MG PO TABS
20.0000 mg | ORAL_TABLET | Freq: Every evening | ORAL | Status: DC
  Administered 2019-10-27 – 2019-10-28 (×2): 20 mg via ORAL
  Filled 2019-10-27 (×3): qty 1

## 2019-10-27 MED ORDER — BUPIVACAINE HCL (PF) 0.5 % IJ SOLN
INTRAMUSCULAR | Status: DC | PRN
  Administered 2019-10-27: 25 mL
  Administered 2019-10-27: 5 mL

## 2019-10-27 MED ORDER — POLYETHYLENE GLYCOL 3350 17 G PO PACK
17.0000 g | PACK | Freq: Every day | ORAL | Status: DC | PRN
  Administered 2019-10-29: 17 g via ORAL
  Filled 2019-10-27: qty 1

## 2019-10-27 MED ORDER — LORATADINE 10 MG PO TABS
10.0000 mg | ORAL_TABLET | Freq: Every evening | ORAL | Status: DC
  Administered 2019-10-27 – 2019-10-28 (×2): 10 mg via ORAL
  Filled 2019-10-27 (×2): qty 1

## 2019-10-27 MED ORDER — ONDANSETRON HCL 4 MG/2ML IJ SOLN: 4.0000 mg | Freq: Four times a day (QID) | INTRAMUSCULAR | Status: DC | PRN

## 2019-10-27 MED ORDER — MORPHINE SULFATE (PF) 2 MG/ML IV SOLN: 2.0000 mg | INTRAVENOUS | Status: DC | PRN

## 2019-10-27 MED ORDER — FENTANYL CITRATE (PF) 250 MCG/5ML IJ SOLN
INTRAMUSCULAR | Status: AC
  Filled 2019-10-27: qty 5

## 2019-10-27 MED ORDER — HYDROCHLOROTHIAZIDE 25 MG PO TABS: 50.0000 mg | ORAL_TABLET | Freq: Every day | ORAL | Status: DC

## 2019-10-27 MED ORDER — HYDROMORPHONE HCL 1 MG/ML IJ SOLN
0.2500 mg | INTRAMUSCULAR | Status: DC | PRN
  Administered 2019-10-27: 0.5 mg via INTRAVENOUS

## 2019-10-27 MED ORDER — 0.9 % SODIUM CHLORIDE (POUR BTL) OPTIME
TOPICAL | Status: DC | PRN
  Administered 2019-10-27: 1000 mL

## 2019-10-27 MED ORDER — LIDOCAINE 2% (20 MG/ML) 5 ML SYRINGE
INTRAMUSCULAR | Status: DC | PRN
  Administered 2019-10-27: 20 mg via INTRAVENOUS
  Administered 2019-10-27: 40 mg via INTRAVENOUS

## 2019-10-27 SURGICAL SUPPLY — 74 items
ADH SKN CLS APL DERMABOND .7 (GAUZE/BANDAGES/DRESSINGS) ×1
ADH SKN CLS LQ APL DERMABOND (GAUZE/BANDAGES/DRESSINGS) ×1
APL SRG 60D 8 XTD TIP BNDBL (TIP)
BAG DECANTER FOR FLEXI CONT (MISCELLANEOUS) ×2 IMPLANT
BASKET BONE COLLECTION (BASKET) ×2 IMPLANT
BLADE CLIPPER SURG (BLADE) ×1 IMPLANT
BONE MATRIX OSTEOCEL PRO MED (Bone Implant) ×4 IMPLANT
BUR MATCHSTICK NEURO 3.0 LAGG (BURR) ×2 IMPLANT
CAGE COROENT LG 12X9X23-12 (Cage) ×2 IMPLANT
CANISTER SUCT 3000ML PPV (MISCELLANEOUS) ×2 IMPLANT
CNTNR URN SCR LID CUP LEK RST (MISCELLANEOUS) ×1 IMPLANT
CONNECTOR RELINE 20MM OPEN OFF (Connector) ×2 IMPLANT
CONT SPEC 4OZ STRL OR WHT (MISCELLANEOUS) ×2
COVER BACK TABLE 60X90IN (DRAPES) ×2 IMPLANT
COVER WAND RF STERILE (DRAPES) ×1 IMPLANT
DECANTER SPIKE VIAL GLASS SM (MISCELLANEOUS) ×2 IMPLANT
DERMABOND ADHESIVE PROPEN (GAUZE/BANDAGES/DRESSINGS) ×1
DERMABOND ADVANCED (GAUZE/BANDAGES/DRESSINGS) ×1
DERMABOND ADVANCED .7 DNX12 (GAUZE/BANDAGES/DRESSINGS) ×1 IMPLANT
DERMABOND ADVANCED .7 DNX6 (GAUZE/BANDAGES/DRESSINGS) IMPLANT
DEVICE DISSECT PLASMABLAD 3.0S (MISCELLANEOUS) ×1 IMPLANT
DIGITIZER BENDINI (MISCELLANEOUS) ×1 IMPLANT
DRAPE C-ARM 42X72 X-RAY (DRAPES) ×4 IMPLANT
DRAPE HALF SHEET 40X57 (DRAPES) IMPLANT
DRAPE LAPAROTOMY 100X72X124 (DRAPES) ×2 IMPLANT
DRSG OPSITE POSTOP 4X6 (GAUZE/BANDAGES/DRESSINGS) ×1 IMPLANT
DRSG OPSITE POSTOP 4X8 (GAUZE/BANDAGES/DRESSINGS) ×1 IMPLANT
DURAPREP 26ML APPLICATOR (WOUND CARE) ×2 IMPLANT
DURASEAL APPLICATOR TIP (TIP) IMPLANT
DURASEAL SPINE SEALANT 3ML (MISCELLANEOUS) IMPLANT
ELECT REM PT RETURN 9FT ADLT (ELECTROSURGICAL) ×2
ELECTRODE REM PT RTRN 9FT ADLT (ELECTROSURGICAL) ×1 IMPLANT
GAUZE 4X4 16PLY RFD (DISPOSABLE) IMPLANT
GAUZE SPONGE 4X4 12PLY STRL (GAUZE/BANDAGES/DRESSINGS) ×1 IMPLANT
GLOVE BIO SURGEON STRL SZ7 (GLOVE) ×1 IMPLANT
GLOVE BIOGEL PI IND STRL 8 (GLOVE) IMPLANT
GLOVE BIOGEL PI IND STRL 8.5 (GLOVE) ×2 IMPLANT
GLOVE BIOGEL PI INDICATOR 8 (GLOVE) ×2
GLOVE BIOGEL PI INDICATOR 8.5 (GLOVE) ×2
GLOVE ECLIPSE 7.5 STRL STRAW (GLOVE) ×1 IMPLANT
GLOVE ECLIPSE 8.5 STRL (GLOVE) ×4 IMPLANT
GLOVE SURG SS PI 7.5 STRL IVOR (GLOVE) ×6 IMPLANT
GOWN STRL REUS W/ TWL LRG LVL3 (GOWN DISPOSABLE) IMPLANT
GOWN STRL REUS W/ TWL XL LVL3 (GOWN DISPOSABLE) IMPLANT
GOWN STRL REUS W/TWL 2XL LVL3 (GOWN DISPOSABLE) ×5 IMPLANT
GOWN STRL REUS W/TWL LRG LVL3 (GOWN DISPOSABLE) ×4
GOWN STRL REUS W/TWL XL LVL3 (GOWN DISPOSABLE) ×2
HEMOSTAT POWDER KIT SURGIFOAM (HEMOSTASIS) ×1 IMPLANT
KIT BASIN OR (CUSTOM PROCEDURE TRAY) ×2 IMPLANT
KIT TURNOVER KIT B (KITS) ×2 IMPLANT
MILL MEDIUM DISP (BLADE) ×2 IMPLANT
NEEDLE HYPO 22GX1.5 SAFETY (NEEDLE) ×2 IMPLANT
NS IRRIG 1000ML POUR BTL (IV SOLUTION) ×2 IMPLANT
PACK LAMINECTOMY NEURO (CUSTOM PROCEDURE TRAY) ×2 IMPLANT
PAD ARMBOARD 7.5X6 YLW CONV (MISCELLANEOUS) ×8 IMPLANT
PATTIES SURGICAL .5 X1 (DISPOSABLE) ×2 IMPLANT
PLASMABLADE 3.0S (MISCELLANEOUS) ×2
ROD RELINE-O 5.5X300 STRT NS (Rod) IMPLANT
ROD RELINE-O 5.5X300MM STRT (Rod) ×4 IMPLANT
SCREW LOCK RELINE 5.5 TULIP (Screw) ×7 IMPLANT
SCREW RELINE 7.5X 80MM 2S POLY (Screw) ×2 IMPLANT
SCREW RELINE-O POLY 6.5X45 (Screw) ×3 IMPLANT
SPONGE LAP 4X18 RFD (DISPOSABLE) IMPLANT
SPONGE SURGIFOAM ABS GEL 100 (HEMOSTASIS) ×1 IMPLANT
SUT PROLENE 6 0 BV (SUTURE) IMPLANT
SUT VIC AB 1 CT1 18XBRD ANBCTR (SUTURE) ×1 IMPLANT
SUT VIC AB 1 CT1 8-18 (SUTURE) ×2
SUT VIC AB 2-0 CP2 18 (SUTURE) ×2 IMPLANT
SUT VIC AB 3-0 SH 8-18 (SUTURE) ×2 IMPLANT
SYR 3ML LL SCALE MARK (SYRINGE) ×8 IMPLANT
TOWEL GREEN STERILE (TOWEL DISPOSABLE) ×2 IMPLANT
TOWEL GREEN STERILE FF (TOWEL DISPOSABLE) ×2 IMPLANT
TRAY FOLEY MTR SLVR 16FR STAT (SET/KITS/TRAYS/PACK) ×2 IMPLANT
WATER STERILE IRR 1000ML POUR (IV SOLUTION) ×2 IMPLANT

## 2019-10-27 NOTE — Op Note (Signed)
Date of surgery: 10/27/2019 Preoperative diagnosis: Chronic right L5 radiculopathy, history of fusion L2 -L5 Postoperative diagnosis: Same Procedure: Decompression L5-S1 with complete facetectomy L5-S1 removal of large synovial cyst on the right side at L5-S1 causing both L5 and S1 radiculopathy and compression.  Posterior lumbar interbody arthrodesis L5-S1 using peek spacers local autograft and allograft, (osteocell) posterior fixation on the right from L5 to the sacrum including ileal fixation on the left from L2 to the ileum. Surgeon: Kristeen Miss First Assistant: Duffy Rhody, MD Anesthesia: General endotracheal Indications: Jeffrey Hooper is a 59 year old individual whose had a previous decompression fusion from L2-L5.  He has had a chronic right lumbar radiculopathy and was concerned that he may have had some hardware problems on the right side which was removed despite this he has persisted in the chronic radiculopathy and recently a myelogram and post myelogram CAT scan demonstrates the formation of a large synovial cyst on the right side causing primarily S1 compression but also L5 nerve root compression is been advised regarding surgery to decompress and stabilize the L5-S1 joint with fixation to the ileum.  Procedure: Patient was brought to the operating room supine on the stretcher after the smooth induction of general endotracheal anesthesia, he was carefully turned prone.  The back was prepped with alcohol DuraPrep and draped in a sterile fashion the previously made midline incision was reopened and dissection was carried inferiorly to expose the hardware on the left side and with this the L5 spinous process and laminar arch and the sacrum.  Dissection was carried out into the lateral gutters to expose the facet joints at L5-S1 and the sacral ala on both sides.'s were carefully decorticated and packed away for later use in grafting.  Then a laminectomy was carried out removing the entire  laminar arch of L5 up to and through the pars.  This allowed for good decompression of the common dural tube and on the right it became apparent that there was a large synovial cyst attached medially and superiorly on the S1 facet.  This was causing substantial dural compromise and was carefully dissected and freed from the dura and then removed in a piecemeal fashion this immediately gave good decompression for the exiting L5 nerve root superiorly and the S1 nerve root inferiorly.  Once this was removed the disc space could be explored there is no to be significant bulge of the disc posteriorly by isolating the dura in this region were able to then open the disc space and do a complete discectomy from the left and right sides removing a substantial quantity of severely degenerated desiccated disc material as the ventral aspect of the disc space was explored and decompressed hemostasis was well and easily able to be maintained decortication of the endplates was undertaken fully.  Then the interspace was sized for an appropriate size spacer and was felt that a 12 degree spacer measuring 12 mm in height 9 mm in width and 23 mm in length would fit best into the ventral aspect of the disc space to maintain the height and separation at L5-S1 with an appropriate lordotic angle this was then placed on the right side and the left side along with a total of 20 cc of bone graft which was packed into this interspace bone graft was a combination of ostia cell and the autograft from the laminar arch that had been harvested.  This was run through bone mill and then mixed with the osteocell in addition then the lateral gutters  were packed with the bone graft from L5 to the sacrum on both sides and the sacral ala had been decorticated previously.  Once this was accomplished then pedicle entry sites were chosen at L5 and S1 L5 on the right side was instrumented and S1 bilaterally was instrumented then by placing a separate fascial  incision over the posterior suprailiac crest is able to isolate the top of the ilium and a pedicle entry site was chosen here a screw was placed measuring 7.5 x 80 mm into the pelvis on the right and then on the left side these were checked radiographically.  An S1 6.5 x 45 mm screws were placed bilaterally under fluoroscopic visualization and on the right side at L4-5 a 6.5 x 45 mm screw was placed the rod caps were removed from the hardware that was plate and remained on the left side and this hardware was noted to be globus hardware.  Then using Bandini we fashioned the rods to fit between the saddles at L5 to the ileum on the right side with a separate transverse connection to the ileum and from L2 to the ileum on the left side.  The rods were contoured using Bandini and then placed into the saddles and tightened in a neutral construct.  In the end final radiographs demonstrated good alignment overall with maintenance of good lordosis at the L5-S1 level.  The remainder of some bone graft was then packed into the interspace at L5-S1 and care was taken to make sure that both nerve roots of L5 and S1 were well decompressed with this hemostasis in the soft tissues was obtained total blood loss of 350 cc was encountered no Cell Saver blood was able to be returned to the patient patient then underwent closure with #1 Vicryl in interrupted fashion 2-0 Vicryl in a subcutaneous tissues 20 cc of half percent Marcaine was injected into the paraspinous fascia and then the superficial tissues were closed with 2-0 Vicryl and 3-0 Vicryl in the subcuticular layer Dermabond was used on the skin patient tolerated procedure well is returned to recovery room in stable condition.

## 2019-10-27 NOTE — Progress Notes (Signed)
Patient ID: Jeffrey Hooper, male   DOB: November 08, 1960, 59 y.o.   MRN: [id number] Vital signs are stable Awakening sluggishly Moving all 4 extremities Stable postop

## 2019-10-27 NOTE — Progress Notes (Signed)
Orthopedic Tech Progress Note Patient Details:  Jeffrey Hooper [birth year] [id number]  Patient ID: Dierdre Highman, male   DOB: Aug 11, 1960, 59 y.o.   MRN: [id number] Applied lso.  Karolee Stamps 10/27/2019, 9:17 PM

## 2019-10-27 NOTE — Anesthesia Postprocedure Evaluation (Signed)
Anesthesia Post Note  Patient: NEKHI OEN  Procedure(s) Performed: Lumbar Five Sacral One Posterior lumbar interbody fusion with fixation from Lumbar Four to Pelvis (N/A Spine Lumbar)     Patient location during evaluation: PACU Anesthesia Type: General Level of consciousness: awake and alert Pain management: pain level controlled Vital Signs Assessment: post-procedure vital signs reviewed and stable Respiratory status: spontaneous breathing, nonlabored ventilation, respiratory function stable and patient connected to nasal cannula oxygen Cardiovascular status: blood pressure returned to baseline and stable Postop Assessment: no apparent nausea or vomiting Anesthetic complications: no    Last Vitals:  Vitals:   10/27/19 1842 10/27/19 2001  BP: 118/72 139/85  Pulse: 76 84  Resp: 10 20  Temp: 37.1 C 36.7 C  SpO2: 96% 98%    Last Pain:  Vitals:   10/27/19 2001  TempSrc: Oral  PainSc:                  Catalina Gravel

## 2019-10-27 NOTE — Anesthesia Procedure Notes (Signed)
Procedure Name: Intubation Date/Time: 10/27/2019 12:35 PM Performed by: Lavell Luster, CRNA Pre-anesthesia Checklist: Patient identified, Emergency Drugs available, Suction available, Patient being monitored and Timeout performed Patient Re-evaluated:Patient Re-evaluated prior to induction Oxygen Delivery Method: Circle system utilized Preoxygenation: Pre-oxygenation with 100% oxygen Induction Type: IV induction Ventilation: Mask ventilation without difficulty Laryngoscope Size: Mac and 4 Grade View: Grade I Tube type: Oral Tube size: 7.5 mm Number of attempts: 1 Airway Equipment and Method: Video-laryngoscopy and Stylet Placement Confirmation: ETT inserted through vocal cords under direct vision,  positive ETCO2 and breath sounds checked- equal and bilateral Secured at: 21 cm Tube secured with: Tape Dental Injury: Teeth and Oropharynx as per pre-operative assessment  Difficulty Due To: Difficulty was anticipated, Difficult Airway- due to reduced neck mobility, Difficult Airway- due to dentition, Difficult Airway- due to anterior larynx and Difficult Airway- due to limited oral opening Future Recommendations: Recommend- induction with short-acting agent, and alternative techniques readily available

## 2019-10-27 NOTE — H&P (Addendum)
Jeffrey Hooper is an 59 y.o. male.   Chief Complaint: Back and right leg pain HPI: Jeffrey Hooper is a 59 year old individual whose had extensive problems with his lumbar spine over the last 30 years he had a traumatic fracture in the thoracolumbar spine that was treated with posterior instrumentation this ultimately required removal and he did fairly well but over the last 5 years he had increasing pain in his back and his lower extremities found to have significant spondylitic stenosis below his fusion and he underwent a fusion at L to 334 and 4 5 unfortunately had problems with a persistent right lumbar radiculopathy this is been problematic for the last couple of years and recent work-up included a myelogram and post myelogram CAT scan which shows that even though his disc space is fairly well-preserved at 5 1 he has developed severe hypertrophic facet arthropathy with stenosis for the right L5 and S1 nerve roots after careful consideration I did advised surgical decompression at L5-S1 bilaterally with posterior interbody arthrodesis lateral fusion and fixation down to the ileum he is now admitted for that procedure.  Past Medical History:  Diagnosis Date  . Allergy   . Anxiety   . Arthritis   . CAD (coronary artery disease), native coronary artery 12/03/2015  . Coronary artery disease   . Dysrhythmia    "irregular and PAC'S"  . GERD (gastroesophageal reflux disease)   . Head injury with loss of consciousness ([place])    back in the 1980's  . History of kidney stones   . Hyperlipidemia   . Hypertension   . Hypothyroid   . Multiple benign nevi   . Neuromuscular disorder ([place]) 2011   LEFT RADIAL NERVE SURGERY R/T TRAUMA  . Pneumonia 2014, 2017  . Prostatism   . Pulmonary nodule     Past Surgical History:  Procedure Laterality Date  . BACK SURGERY     T12 - L1  . CARDIAC CATHETERIZATION N/A 12/02/2015   Procedure: Left Heart Cath and Coronary Angiography;  Surgeon: Peter M Martinique,  MD;  Location: [facility];  Service: Cardiovascular;  Laterality: N/A;  . CARDIAC CATHETERIZATION N/A 12/02/2015   Procedure: Coronary Stent Intervention;  Surgeon: Peter M Martinique, MD;  Location: [place] [facility];  Service: Cardiovascular;  Laterality: N/A;  mid LAD Promus 2.5x12  . CORONARY ANGIOPLASTY     one stent placed by Dr. P Martinique  . HARDWARE REMOVAL Right 08/26/2017   Procedure: Right Lumbar Five Revision of pedicle screw with Removal of Lumbar Five Screw;  Surgeon: Kristeen Miss, MD;  Location: [facility];  Service: Neurosurgery;  Laterality: Right;  posterior  . HARDWARE REMOVAL Right 12/27/2017   Procedure: Right Lumbar Two, Lumbar Three, Lumbar Four Pedicle screw removal with metrex;  Surgeon: Kristeen Miss, MD;  Location: [facility];  Service: Neurosurgery;  Laterality: Right;  Right L2 to L4 Pedicle screw removal with mterex  . MASS EXCISION  06/25/2012   Procedure: EXCISION MASS;  Surgeon: Wynonia Sours, MD;  Location: [facility];  Service: Orthopedics;  Laterality: Left;  transection of NEUROMA, BURYING RADIAL NERVE IN BRACHIORADIALIS LEFT SIDE  . radial nerve Left    cut in work injury  . SPINAL FUSION     C6-7  . TONSILLECTOMY  1982  . VASECTOMY      Family History  Problem Relation Age of Onset  . Heart failure Mother 44       Died age 94  .  Congestive Heart Failure Mother   . Heart attack Father 68       Died with MI  . Heart attack Paternal Grandmother   . Heart attack Paternal Grandfather   . Colon cancer Neg Hx    Social History:  reports that he quit smoking about 12 years ago. His smoking use included cigarettes. He has a 20.00 pack-year smoking history. He has never used smokeless tobacco. He reports current alcohol use. He reports that he does not use drugs.  Allergies:  Allergies  Allergen Reactions  . Alpha-Gal Rash and Other (See Comments)    SEVERE ALLERGY TO ANY MEAT OR MEAT DERIVED PRODUCTS FROM 4 LEGGED ANIMALS > > BEEF, PORK ,  GOATS, DEER, ETC. < < RESULT OF BITE FROM LONE STAR TICK  . Beef (Bovine) Protein Rash and Other (See Comments)  . Beef-Derived Products Rash and Other (See Comments)  . Lambs Quarters Rash and Other (See Comments)  . Pork-Derived Products Rash and Other (See Comments)  . Prednisone Other (See Comments)    im depo medrol caused dizziness - pt passed out. Ask pt before giving. Pt can take oral prednisone   . Brilinta [Ticagrelor] Hives    Pt ate pork on the same day he took Brilinta before knowing he had alpha gal. May have been the alpha gal reaction, pt is unsure.   . Doxycycline Hives  . Lyrica [Pregabalin]     Dry mouth and felt raw  . Methylprednisolone Sodium Succ     im depo medrol caused dizziness - pt passed out. Ask pt before giving.  [name] Penicillins Hives    Did it involve swelling of the face/tongue/throat, SOB, or low BP? No Did it involve sudden or severe rash/hives, skin peeling, or any reaction on the inside of your mouth or nose? No Did you need to seek medical attention at a hospital or doctor's office? No When did it last happen?10 + years ago If all above answers are "NO", may proceed with cephalosporin use.   . Shellfish Allergy Hives  . Adhesive [Tape] Rash  . Fentanyl Rash    Reaction to adhesive, not the drug     Medications Prior to Admission  Medication Sig Dispense Refill  . acetaminophen (TYLENOL) 500 MG tablet Take 1,000 mg by mouth daily as needed for mild pain or headache.     [name] amLODipine (NORVASC) 5 MG tablet Take 1.5 tablets (7.5 mg total) by mouth daily. 135 tablet 3  . aspirin EC 81 MG tablet Take 81 mg by mouth at bedtime.    [name] atorvastatin (LIPITOR) 80 MG tablet Take 1 tablet (80 mg total) by mouth at bedtime. 90 tablet 2  . betamethasone dipropionate (DIPROLENE) 0.05 % cream APPLY TO AFFECTED AREA TWICE A DAY (Patient taking differently: Apply 1 application topically daily as needed (hemorrhoids). ) 30 g 0  . EPINEPHrine 0.3 mg/0.3 mL IJ  SOAJ injection Inject 0.3 mLs (0.3 mg total) into the muscle as needed for anaphylaxis. 1 Device 3  . fluticasone (FLONASE) 50 MCG/ACT nasal spray SPRAY 2 SPRAYS INTO EACH NOSTRIL EVERY DAY (Patient taking differently: Place 2 sprays into both nostrils daily. ) 48 mL 1  . hydrochlorothiazide (HYDRODIURIL) 50 MG tablet Take 1 tablet (50 mg total) by mouth daily. 90 tablet 1  . HYDROcodone-acetaminophen (NORCO/VICODIN) 5-325 MG tablet Take 1 tablet by mouth every 6 (six) hours as needed. for pain    . ketoconazole (NIZORAL) 2 % cream Apply 1 application topically  daily.    . levocetirizine (XYZAL) 5 MG tablet Take 1 tablet (5 mg total) by mouth every evening. 90 tablet 3  . levothyroxine (SYNTHROID) 100 MCG tablet Take 1 tablet (100 mcg total) by mouth daily. 90 tablet 2  . nitroGLYCERIN (NITROSTAT) 0.4 MG SL tablet Place 1 tablet (0.4 mg total) under the tongue every 5 (five) minutes as needed for chest pain. 20 tablet 2  . pantoprazole (PROTONIX) 40 MG tablet Take 1 tablet (40 mg total) by mouth daily. 90 tablet 3  . PARoxetine (PAXIL) 20 MG tablet Take 1 tablet (20 mg total) by mouth daily. (Patient taking differently: Take 20 mg by mouth every evening. ) 90 tablet 1  . diphenhydrAMINE (BENADRYL) 25 MG tablet Take 25 mg by mouth 2 (two) times daily as needed for itching.       No results found for this or any previous visit (from the past 48 hour(s)). No results found.  Review of Systems  Constitutional: Positive for activity change.  HENT: Negative.   Eyes: Negative.   Respiratory: Negative.   Cardiovascular: Negative.   Gastrointestinal: Negative.   Endocrine: Negative.   Genitourinary: Negative.   Musculoskeletal: Positive for back pain.  Allergic/Immunologic: Negative.   Neurological:       Right lower extremity radiculopathy in S1 distribution  Hematological: Negative.   Psychiatric/Behavioral: Negative.     Blood pressure 138/67, pulse 71, temperature 98.4 F (36.9 C),  temperature source Oral, resp. rate 18, height 5' 10.5" (1.791 m), weight 91 kg, SpO2 99 %. Physical Exam  Constitutional: He is oriented to person, place, and time. He appears well-developed and well-nourished.  HENT:  Head: Normocephalic and atraumatic.  Eyes: Pupils are equal, round, and reactive to light. Conjunctivae and EOM are normal.  Cardiovascular: Normal rate and regular rhythm.  GI: Soft. Bowel sounds are normal.  Musculoskeletal:     Cervical back: Normal range of motion and neck supple.     Comments: Paraspinous stiffness and pain with palpation and percussion straight leg raising is positive on the right side at 15 degrees negative on the left side to 60 degrees Patrick's maneuver is negative bilaterally.  Neurological: He is alert and oriented to person, place, and time.  Sensation is diminished on the dorsum and lateral aspect of the right lower extremity.  Tibialis anterior strength is slightly decreased on the right to 4-5.  Left-sided strength is intact.  Cranial nerve examination cerebellar examination Station and gait are intact.  Skin: Skin is warm and dry.  Psychiatric: He has a normal mood and affect. His behavior is normal. Judgment and thought content normal.     Assessment/Plan Spondylosis L5-S1 with radiculopathy  Decompression and fusion L5-S1 with fixation to the ileum.  Posterior interbody arthrodesis.  Earleen Newport, MD 10/27/2019, 11:21 AM

## 2019-10-27 NOTE — Transfer of Care (Signed)
Immediate Anesthesia Transfer of Care Note  Patient: Jeffrey Hooper  Procedure(s) Performed: Lumbar Five Sacral One Posterior lumbar interbody fusion with fixation from Lumbar Four to Pelvis (N/A Spine Lumbar)  Patient Location: PACU  Anesthesia Type:General  Level of Consciousness: awake  Airway & Oxygen Therapy: Patient Spontanous Breathing and Patient connected to face mask oxygen  Post-op Assessment: Report given to RN and Post -op Vital signs reviewed and stable  Post vital signs: Reviewed and stable  Last Vitals:  Vitals Value Taken Time  BP 143/75 10/27/19 1811  Temp    Pulse 80 10/27/19 1815  Resp 12 10/27/19 1815  SpO2 100 % 10/27/19 1815  Vitals shown include unvalidated device data.  Last Pain:  Vitals:   10/27/19 1047  TempSrc:   PainSc: 4       Patients Stated Pain Goal: 3 ([id number])  Complications: No apparent anesthesia complications

## 2019-10-28 LAB — BASIC METABOLIC PANEL WITH GFR
Anion gap: 9 (ref 5–15)
BUN: 12 mg/dL (ref 6–20)
CO2: 30 mmol/L (ref 22–32)
Calcium: 8.4 mg/dL — ABNORMAL LOW (ref 8.9–10.3)
Chloride: 102 mmol/L (ref 98–111)
Creatinine, Ser: 1.03 mg/dL (ref 0.61–1.24)
GFR calc Af Amer: >60 mL/min
GFR calc non Af Amer: >60 mL/min
Glucose, Bld: 105 mg/dL — ABNORMAL HIGH (ref 70–99)
Potassium: 2.9 mmol/L — ABNORMAL LOW (ref 3.5–5.1)
Sodium: 141 mmol/L (ref 135–145)

## 2019-10-28 LAB — CBC
HCT: 30.5 % — ABNORMAL LOW (ref 39.0–52.0)
Hemoglobin: 10.4 g/dL — ABNORMAL LOW (ref 13.0–17.0)
MCH: 29.7 pg (ref 26.0–34.0)
MCHC: 34.1 g/dL (ref 30.0–36.0)
MCV: 87.1 fL (ref 80.0–100.0)
Platelets: 167 K/uL (ref 150–400)
RBC: 3.5 MIL/uL — ABNORMAL LOW (ref 4.22–5.81)
RDW: 13.8 % (ref 11.5–15.5)
WBC: 7.8 K/uL (ref 4.0–10.5)
nRBC: 0 % (ref 0.0–0.2)

## 2019-10-28 MED ORDER — POTASSIUM CHLORIDE 20 MEQ PO PACK
40.0000 meq | PACK | Freq: Three times a day (TID) | ORAL | Status: DC
  Filled 2019-10-28: qty 2

## 2019-10-28 MED ORDER — DEXAMETHASONE 4 MG PO TABS
2.0000 mg | ORAL_TABLET | Freq: Two times a day (BID) | ORAL | Status: DC
  Administered 2019-10-28 – 2019-10-29 (×3): 2 mg via ORAL
  Filled 2019-10-28 (×3): qty 1

## 2019-10-28 MED ORDER — POTASSIUM CHLORIDE CRYS ER 20 MEQ PO TBCR
40.0000 meq | EXTENDED_RELEASE_TABLET | Freq: Three times a day (TID) | ORAL | Status: DC
  Administered 2019-10-28 – 2019-10-29 (×4): 40 meq via ORAL
  Filled 2019-10-28 (×4): qty 2

## 2019-10-28 MED FILL — Thrombin (Recombinant) For Soln 5000 Unit: CUTANEOUS | Qty: 5000 | Status: AC

## 2019-10-28 NOTE — Progress Notes (Signed)
Patient ID: Jeffrey Hooper, male   DOB: Dec 29, 1960, 59 y.o.   MRN: [id number] Vital signs are stable Patient's potassium was 2.9 today Labs are otherwise good his hemoglobin is at 10 He is sore in his back but his right leg does feel somewhat better Overall I believe that he will do reasonably well we will allow to stay in the hospital today as he is not moving very well and is complaining of substantial pain Plan discharge for tomorrow if all is well

## 2019-10-28 NOTE — Evaluation (Signed)
Occupational Therapy Evaluation and Discharge Patient Details Name: Jeffrey Hooper MRN: [id number] DOB: [birth year] Today's Date: 10/28/2019    History of Present Illness Pt is a 59 yo male s/p L5-S1 decompression/fusion and posterior fixation of L5-sacrum. PMH includes chronic radiculopathy, prior decompression/fusion L2-5, CAD, HLD, and HTN.   Clinical Impression   Pt was walking with a walking stick or furniture and could perform ADL and IADL independently prior to admission. He reports he has had 9 prior back surgeries. Pt educated in back precautions, use of AE, compensatory strategies and benefits of seated showering. He will have support of his son and wife when he returns home. Pt verbalized understanding of all information. No further OT needs.     Follow Up Recommendations  No OT follow up    Equipment Recommendations  None recommended by OT    Recommendations for Other Services       Precautions / Restrictions Precautions Precautions: Back Precaution Booklet Issued: Yes (comment) Precaution Comments: reviewed back precautions related to ADL and IADL Required Braces or Orthoses: Spinal Brace Spinal Brace: Lumbar corset;Applied in sitting position;Other (comment) Spinal Brace Comments: educated pt in adjusting back brace after each use Restrictions Weight Bearing Restrictions: No      Mobility Bed Mobility       General bed mobility comments: receive in chair  Transfers Overall transfer level: Needs assistance Equipment used: Straight cane Transfers: Sit to/from Stand Sit to Stand: Supervision         General transfer comment: pt able to sit-stand with VCs for hand placement, no assist needed. no LOB upon initial stand.    Balance Overall balance assessment: Mild deficits observed, not formally tested                                         ADL either performed or assessed with clinical judgement   ADL Overall ADL's : Needs  assistance/impaired Eating/Feeding: Independent   Grooming: Supervision/safety;Standing Grooming Details (indicate cue type and reason): educated in two cup method for tooth brushing and wash cloth for washing face Upper Body Bathing: Set up;Sitting Upper Body Bathing Details (indicate cue type and reason): recommended long handled bath sponge for back to avoid twisting Lower Body Bathing: Minimal assistance;Sit to/from stand Lower Body Bathing Details (indicate cue type and reason): recommended long handled bath sponge, educated in benefits of reacher Upper Body Dressing : Set up;Sitting Upper Body Dressing Details (indicate cue type and reason): including back brace Lower Body Dressing: Moderate assistance;Sit to/from stand Lower Body Dressing Details (indicate cue type and reason): typically can cross foot over opposite knee, cannot currently due to pain Toilet Transfer: Ambulation;Min guard(cane)     Toileting - Clothing Manipulation Details (indicate cue type and reason): instructed to avoid twisting with pericare,use of tongs   Tub/Shower Transfer Details (indicate cue type and reason): recommended seated showering, pt unsure if his built in seat is adequate, discussed other options Functional mobility during ADLs: Min guard;Cane General ADL Comments: Pt can defer IADL to son.     Vision Baseline Vision/History: Wears glasses Wears Glasses: At all times       Perception     Praxis      Pertinent Vitals/Pain Pain Assessment: Faces Pain Score: 6  Faces Pain Scale: Hurts little more Pain Location: low back, r hip Pain Descriptors / Indicators: Discomfort;Operative site guarding;Sore;Grimacing Pain Intervention(s): Monitored during session;Repositioned;Patient  requesting pain meds-RN notified     Hand Dominance Right   Extremity/Trunk Assessment Upper Extremity Assessment Upper Extremity Assessment: Overall WFL for tasks assessed      Cervical / Trunk  Assessment Cervical / Trunk Assessment: Kyphotic;Other exceptions Cervical / Trunk Exceptions: s/p spinal sx   Communication Communication Communication: No difficulties   Cognition Arousal/Alertness: Awake/alert Behavior During Therapy: WFL for tasks assessed/performed Overall Cognitive Status: Within Functional Limits for tasks assessed                                     General Comments       Exercises     Shoulder Instructions      Home Living Family/patient expects to be discharged to:: Private residence Living Arrangements: Spouse/significant other;Children Available Help at Discharge: Available 24 hours/day;Family Type of Home: House Home Access: Stairs to enter [employer] of Steps: 1 Entrance Stairs-Rails: None Home Layout: One level     Bathroom Shower/Tub: Occupational psychologist: Handicapped height Bathroom Accessibility: Yes   Home Equipment: Environmental consultant - 2 wheels;Other (comment);Shower seat - built in(walking stick)   Additional Comments: pt thinks he has grab bars in the shower, unsure      Prior Functioning/Environment Level of Independence: Independent with assistive device(s)        Comments: pt using walking stick or furniture-walking in home. some use of RW        OT Problem List:        OT Treatment/Interventions:      OT Goals(Current goals can be found in the care plan section) Acute Rehab OT Goals Patient Stated Goal: to return home OT Goal Formulation: With patient  OT Frequency:     Barriers to D/C:            Co-evaluation              AM-PAC OT "6 Clicks" Daily Activity     Outcome Measure Help from another person eating meals?: None Help from another person taking care of personal grooming?: A Little Help from another person toileting, which includes using toliet, bedpan, or urinal?: A Little Help from another person bathing (including washing, rinsing, drying)?: A Little Help  from another person to put on and taking off regular upper body clothing?: None Help from another person to put on and taking off regular lower body clothing?: A Lot 6 Click Score: 19   End of Session Equipment Utilized During Treatment: Back brace Nurse Communication: Patient requests pain meds  Activity Tolerance: Patient tolerated treatment well Patient left: in chair;with call bell/phone within reach  OT Visit Diagnosis: Other abnormalities of gait and mobility (R26.89);Unsteadiness on feet (R26.81);Pain                Time: [id number] OT Time Calculation (min): 12 min Charges:  OT General Charges $OT Visit: 1 Visit OT Evaluation $OT Eval Moderate Complexity: 1 Mod  Nestor Lewandowsky, OTR/L Acute Rehabilitation Services Pager: [phone] Office: [phone]  Malka So 10/28/2019, 11:11 AM

## 2019-10-28 NOTE — Evaluation (Signed)
Physical Therapy Evaluation Patient Details Name: Jeffrey Hooper MRN: [id number] DOB: 11-09-1960 Today's Date: 10/28/2019   History of Present Illness  Pt is a 59 yo male s/p L5-S1 decompression/fusion and posterior fixation of L5-sacrum. PMH includes chronic radiculopathy, prior decompression/fusion L2-5, CAD, HLD, and HTN.  Clinical Impression  Pt in bed upon arrival of PT, agreeable to evaluation at this time. Prior to admission the pt was independent with use of a walking stick for ambulation and living with wife and adult son who can assist around the home as needed. The pt now presents with limitations in functional mobility, endurance, and deficits in strength and sensation in his RLE due to above dx, and will continue to benefit from skilled PT to address these deficits. The pt was able to demo good stability with ambulation in hall using [facility], and was able to demo good functional use of spinal precautions, but remains pain limited for further progression of mobility. The pt will continue to benefit from skilled PT to further progress his ability to mobilize independently.     Follow Up Recommendations Follow surgeon's recommendation for DC plan and follow-up therapies;Supervision/Assistance - 24 hour(pt would benefit from OPPT in future for continued RLE and core strengthening if surgeon wishes.)    Equipment Recommendations  Cane    Recommendations for Other Services       Precautions / Restrictions Precautions Precautions: Back Precaution Booklet Issued: Yes (comment) Required Braces or Orthoses: Spinal Brace Spinal Brace: Lumbar corset;Applied in sitting position;Other (comment) Spinal Brace Comments: pt can go to bathroom without putting brace on Restrictions Weight Bearing Restrictions: No      Mobility  Bed Mobility Overal bed mobility: Needs Assistance Bed Mobility: Rolling;Sidelying to Sit Rolling: Supervision Sidelying to sit: Supervision       General bed  mobility comments: supervision for log roll technique, no assist needed  Transfers Overall transfer level: Needs assistance Equipment used: None;Straight cane Transfers: Sit to/from Stand Sit to Stand: Supervision         General transfer comment: pt able to sit-stand with VCs for hand placement, no assist needed. no LOB upon initial stand.  Ambulation/Gait Ambulation/Gait assistance: Min guard Gait Distance (Feet): 100 Feet Assistive device: Straight cane Gait Pattern/deviations: Step-through pattern;Decreased stride length Gait velocity: 0.27 m/s Gait velocity interpretation: <1.8 ft/sec, indicate of risk for recurrent falls General Gait Details: pt with slow but steady ambulation, good use of spinal precautions during mobility. Pt benefits from use of SPC for stability. `  Stairs            Wheelchair Mobility    Modified Rankin (Stroke Patients Only)       Balance Overall balance assessment: Mild deficits observed, not formally tested                                           Pertinent Vitals/Pain Pain Assessment: 0-10 Pain Score: 6  Pain Location: low back, r hip Pain Descriptors / Indicators: Discomfort;Operative site guarding;Sore;Grimacing Pain Intervention(s): Limited activity within patient's tolerance;Monitored during session;Repositioned    Home Living Family/patient expects to be discharged to:: Private residence Living Arrangements: Spouse/significant other;Children(adult son home during day, wife home in evenings) Available Help at Discharge: Available 24 hours/day;Family Type of Home: House Home Access: Stairs to enter Entrance Stairs-Rails: None Entrance Stairs-Number of Steps: 1 Home Layout: One level Home Equipment: Environmental consultant - 2 wheels;Other (  comment)(pt reports having a "walking stick" and "seats" in the corners of his shower, but may need something studier) Additional Comments: pt thinks he has grab bars in the shower,  unsure    Prior Function Level of Independence: Independent with assistive device(s)         Comments: pt using walking stick or furniture-walking in home. some use of RW     Hand Dominance   Dominant Hand: Right    Extremity/Trunk Assessment   Upper Extremity Assessment Upper Extremity Assessment: Defer to OT evaluation    Lower Extremity Assessment Lower Extremity Assessment: RLE deficits/detail RLE Deficits / Details: 4/5 knee ext and great toe extension RLE RLE Sensation: decreased light touch(decreased LT L4-S2 dermatomes RLE)    Cervical / Trunk Assessment Cervical / Trunk Assessment: Kyphotic  Communication   Communication: No difficulties  Cognition Arousal/Alertness: Awake/alert Behavior During Therapy: WFL for tasks assessed/performed Overall Cognitive Status: Within Functional Limits for tasks assessed                                        General Comments      Exercises     Assessment/Plan    PT Assessment Patient needs continued PT services  PT Problem List Decreased strength;Decreased mobility;Decreased range of motion;Decreased coordination;Decreased activity tolerance;Decreased balance;Impaired sensation;Pain       PT Treatment Interventions DME instruction;Therapeutic exercise;Gait training;Balance training;Stair training;Functional mobility training;Patient/family education;Therapeutic activities    PT Goals (Current goals can be found in the Care Plan section)  Acute Rehab PT Goals Patient Stated Goal: to return home PT Goal Formulation: With patient Time For Goal Achievement: 11/11/19 Potential to Achieve Goals: Good    Frequency Min 5X/week   Barriers to discharge        Co-evaluation               AM-PAC PT "6 Clicks" Mobility  Outcome Measure Help needed turning from your back to your side while in a flat bed without using bedrails?: A Little Help needed moving from lying on your back to sitting on the  side of a flat bed without using bedrails?: A Little Help needed moving to and from a bed to a chair (including a wheelchair)?: A Little Help needed standing up from a chair using your arms (e.g., wheelchair or bedside chair)?: None Help needed to walk in hospital room?: A Little Help needed climbing 3-5 steps with a railing? : A Little 6 Click Score: 19    End of Session Equipment Utilized During Treatment: Gait belt;Back brace Activity Tolerance: Patient tolerated treatment well Patient left: in chair;with call bell/phone within reach Nurse Communication: Mobility status PT Visit Diagnosis: Difficulty in walking, not elsewhere classified (R26.2);Pain Pain - Right/Left: Right Pain - part of body: Hip(back)    Time: [id number] PT Time Calculation (min) (ACUTE ONLY): 28 min   Charges:   PT Evaluation $PT Eval Low Complexity: 1 Low PT Treatments $Gait Training: 8-22 mins        Karma Ganja, PT, DPT   Acute Rehabilitation Department Pager #: [phone]  Otho Bellows 10/28/2019, 10:32 AM

## 2019-10-29 LAB — BASIC METABOLIC PANEL
Anion gap: 11 (ref 5–15)
BUN: 9 mg/dL (ref 6–20)
CO2: 27 mmol/L (ref 22–32)
Calcium: 9.4 mg/dL (ref 8.9–10.3)
Chloride: 102 mmol/L (ref 98–111)
Creatinine, Ser: 0.98 mg/dL (ref 0.61–1.24)
GFR calc Af Amer: >60 mL/min (ref >60)
GFR calc non Af Amer: >60 mL/min (ref >60)
Glucose, Bld: 152 mg/dL — ABNORMAL HIGH (ref 70–99)
Potassium: 4.6 mmol/L (ref 3.5–5.1)
Sodium: 140 mmol/L (ref 135–145)

## 2019-10-29 MED ORDER — OXYCODONE-ACETAMINOPHEN 5-325 MG PO TABS: 1.0000 | ORAL_TABLET | ORAL | 0 refills | Status: DC | PRN

## 2019-10-29 MED ORDER — DIAZEPAM 5 MG PO TABS: 5.0000 mg | ORAL_TABLET | Freq: Four times a day (QID) | ORAL | 0 refills | Status: DC | PRN

## 2019-10-29 MED ORDER — DEXAMETHASONE 1 MG PO TABS: ORAL_TABLET | ORAL | Status: DC

## 2019-10-29 MED FILL — Sodium Chloride IV Soln 0.9%: INTRAVENOUS | Qty: 1000 | Status: AC

## 2019-10-29 MED FILL — Heparin Sodium (Porcine) Inj 1000 Unit/ML: INTRAMUSCULAR | Qty: 30 | Status: AC

## 2019-10-29 NOTE — Progress Notes (Signed)
Patient is discharged from room 3C05 at this time. Alert and in stable condition. IV site d/c'd and instructions read to patient with understanding verbalized. Left unit via wheelchair with all belongings at side. 

## 2019-10-29 NOTE — Discharge Summary (Signed)
Physician Discharge Summary  Patient ID: Jeffrey Hooper MRN: [id number] DOB/AGE: 01-08-1961 59 y.o.  Admit date: 10/27/2019 Discharge date: 10/29/2019  Admission Diagnoses: Spondylosis with radiculopathy L5-S1  Discharge Diagnoses: Spondylosis with radiculopathy L5-S1 history of lumbar fusion L2-L5 Active Problems:   Lumbar radiculopathy, chronic   Discharged Condition: good  Hospital Course: Patient was admitted to undergo surgical decompression at L5-S1 and fixation and fusion from the previous fusion down to the ileum he tolerated surgery well  Consults: None  Significant Diagnostic Studies: None  Treatments: surgery: Decompression and fusion L5-S1 with fixation to the ileum  Discharge Exam: Blood pressure (!) 144/77, pulse 84, temperature 98 F (36.7 C), temperature source Oral, resp. rate 16, height 5' 10.5" (1.791 m), weight 91 kg, SpO2 99 %. Incision is clean and dry motor function is intact  Disposition: Discharge disposition: 01-Home or Self Care       Discharge Instructions    Call MD for:  redness, tenderness, or signs of infection (pain, swelling, redness, odor or green/yellow discharge around incision site)   Complete by: As directed    Call MD for:  severe uncontrolled pain   Complete by: As directed    Call MD for:  temperature >100.4   Complete by: As directed    Diet - low sodium heart healthy   Complete by: As directed    Discharge instructions   Complete by: As directed    Okay to shower. Do not apply salves or appointments to incision. No heavy lifting with the upper extremities greater than 15 pounds. May resume driving when not requiring pain medication and patient feels comfortable with doing so.   Incentive spirometry RT   Complete by: As directed    Increase activity slowly   Complete by: As directed      Allergies as of 10/29/2019      Reactions   Alpha-gal Rash, Other (See Comments)   SEVERE ALLERGY TO ANY MEAT OR MEAT DERIVED PRODUCTS  FROM 4 LEGGED ANIMALS > > BEEF, PORK , GOATS, DEER, ETC. < < RESULT OF BITE FROM LONE STAR TICK   Beef (bovine) Protein Rash, Other (See Comments)   Beef-derived Products Rash, Other (See Comments)   Lambs Quarters Rash, Other (See Comments)   Pork-derived Products Rash, Other (See Comments)   Prednisone Other (See Comments)   im depo medrol caused dizziness - pt passed out. Ask pt before giving. Pt can take oral prednisone    Brilinta [ticagrelor] Hives   Pt ate pork on the same day he took Brilinta before knowing he had alpha gal. May have been the alpha gal reaction, pt is unsure.    Doxycycline Hives   Lyrica [pregabalin]    Dry mouth and felt raw   Methylprednisolone Sodium Succ    im depo medrol caused dizziness - pt passed out. Ask pt before giving.   Penicillins Hives   Did it involve swelling of the face/tongue/throat, SOB, or low BP? No Did it involve sudden or severe rash/hives, skin peeling, or any reaction on the inside of your mouth or nose? No Did you need to seek medical attention at a hospital or doctor's office? No When did it last happen?10 + years ago If all above answers are "NO", may proceed with cephalosporin use.   Shellfish Allergy Hives   Adhesive [tape] Rash   Fentanyl Rash   Reaction to adhesive, not the drug       Medication List    TAKE these medications  acetaminophen 500 MG tablet Commonly known as: TYLENOL Take 1,000 mg by mouth daily as needed for mild pain or headache.   amLODipine 5 MG tablet Commonly known as: NORVASC Take 1.5 tablets (7.5 mg total) by mouth daily.   aspirin EC 81 MG tablet Take 81 mg by mouth at bedtime.   atorvastatin 80 MG tablet Commonly known as: LIPITOR Take 1 tablet (80 mg total) by mouth at bedtime.   betamethasone dipropionate 0.05 % cream APPLY TO AFFECTED AREA TWICE A DAY What changed:   how much to take  how to take this  when to take this  reasons to take this  additional instructions    dexamethasone 1 MG tablet Commonly known as: DECADRON 2 tablets twice daily for 2 days, one tablet twice daily for 2 days, one tablet daily for 2 days.   diazepam 5 MG tablet Commonly known as: Valium Take 1 tablet (5 mg total) by mouth every 6 (six) hours as needed for muscle spasms.   diphenhydrAMINE 25 MG tablet Commonly known as: BENADRYL Take 25 mg by mouth 2 (two) times daily as needed for itching.   EPINEPHrine 0.3 mg/0.3 mL Soaj injection Commonly known as: EPI-PEN Inject 0.3 mLs (0.3 mg total) into the muscle as needed for anaphylaxis.   fluticasone 50 MCG/ACT nasal spray Commonly known as: FLONASE SPRAY 2 SPRAYS INTO EACH NOSTRIL EVERY DAY What changed: See the new instructions.   hydrochlorothiazide 50 MG tablet Commonly known as: HYDRODIURIL Take 1 tablet (50 mg total) by mouth daily.   HYDROcodone-acetaminophen 5-325 MG tablet Commonly known as: NORCO/VICODIN Take 1 tablet by mouth every 6 (six) hours as needed. for pain   ketoconazole 2 % cream Commonly known as: NIZORAL Apply 1 application topically daily.   levocetirizine 5 MG tablet Commonly known as: XYZAL Take 1 tablet (5 mg total) by mouth every evening.   levothyroxine 100 MCG tablet Commonly known as: SYNTHROID Take 1 tablet (100 mcg total) by mouth daily.   nitroGLYCERIN 0.4 MG SL tablet Commonly known as: NITROSTAT Place 1 tablet (0.4 mg total) under the tongue every 5 (five) minutes as needed for chest pain.   oxyCODONE-acetaminophen 5-325 MG tablet Commonly known as: PERCOCET/ROXICET Take 1-2 tablets by mouth every 4 (four) hours as needed for moderate pain or severe pain.   pantoprazole 40 MG tablet Commonly known as: PROTONIX Take 1 tablet (40 mg total) by mouth daily.   PARoxetine 20 MG tablet Commonly known as: Paxil Take 1 tablet (20 mg total) by mouth daily. What changed: when to take this            Durable Medical Equipment  (From admission, onward)         Start      Ordered   10/28/19 1024  For home use only DME Cane  Once     10/28/19 1023           Signed: Blanchie Dessert Kinley Dozier 10/29/2019, 10:55 AM

## 2019-10-29 NOTE — Discharge Instructions (Signed)

## 2019-10-29 NOTE — Progress Notes (Signed)
Physical Therapy Treatment Patient Details Name: Jeffrey Hooper MRN: [id number] DOB: [birth year] Today's Date: 10/29/2019    History of Present Illness Pt is a 59 yo male s/p L5-S1 decompression/fusion and posterior fixation of L5-sacrum. PMH includes chronic radiculopathy, prior decompression/fusion L2-5, CAD, HLD, and HTN.    PT Comments    Pt with good recall and application of spinal precautions this session, requires min cuing for upright posture during gait. Pt proficiently navigated steps, demonstrating readiness to d/c to home with support of his wife. PT reviewed home walking program with pt. Pt with no further questions, appropriate to d/c from acute setting from a mobility standpoint.    Follow Up Recommendations  Follow surgeon's recommendation for DC plan and follow-up therapies;Supervision/Assistance - 24 hour(pt would benefit from OPPT in future for continued RLE and core strengthening if surgeon wishes.)     Equipment Recommendations  Cane    Recommendations for Other Services       Precautions / Restrictions Precautions Precautions: Back Precaution Booklet Issued: Yes (comment) Precaution Comments: pt able to verbalize 3/4 back precautions, requires PT verbal cuing for no arching spine. Good application of spinal precautions during mobility. Required Braces or Orthoses: Spinal Brace Spinal Brace: Lumbar corset;Applied in sitting position;Other (comment) Restrictions Weight Bearing Restrictions: No    Mobility  Bed Mobility Overal bed mobility: Modified Independent             General bed mobility comments: pt verbalizes log roll technique for in/out of bed to PT, pt up in chair upon PT arrival to room.  Transfers Overall transfer level: Needs assistance Equipment used: Straight cane Transfers: Sit to/from Stand Sit to Stand: Supervision         General transfer comment: for safety, verbal cuing for standing and sitting with bending hips/knees as  opposed to back.  Ambulation/Gait Ambulation/Gait assistance: Supervision Gait Distance (Feet): 300 Feet Assistive device: Straight cane Gait Pattern/deviations: Step-through pattern;Decreased stride length;Trunk flexed     General Gait Details: supervision for safety, verbal cuing for sequencing cane with ambulation, upright posture cues "tuck your tailbone, chest up"   Stairs Stairs: Yes Stairs assistance: Supervision Stair Management: One rail Left Number of Stairs: 2 General stair comments: supervision for safety, use of L handrail as pt has retaining wall that will be supportive like rail on entry steps at home. Verbal cuing for caregiver placement for guarding when ascending/descending steps.   Wheelchair Mobility    Modified Rankin (Stroke Patients Only)       Balance Overall balance assessment: Mild deficits observed, not formally tested                                          Cognition Arousal/Alertness: Awake/alert Behavior During Therapy: WFL for tasks assessed/performed Overall Cognitive Status: Within Functional Limits for tasks assessed                                        Exercises Other Exercises Other Exercises: home walking program: discussed up and walking 5-10 minutes at a time with supervision, once every hour for circulation and prevention of spinal stiffness/poor posture.    General Comments        Pertinent Vitals/Pain Pain Assessment: 0-10 Pain Score: 3  Pain Location: low back, incisional Pain Descriptors /  Indicators: Discomfort;Operative site guarding;Sore;Grimacing Pain Intervention(s): Limited activity within patient's tolerance;Monitored during session;Repositioned;Premedicated before session    Home Living                      Prior Function            PT Goals (current goals can now be found in the care plan section) Acute Rehab PT Goals Patient Stated Goal: to return home PT  Goal Formulation: With patient Time For Goal Achievement: 11/11/19 Potential to Achieve Goals: Good Progress towards PT goals: Progressing toward goals    Frequency    Min 5X/week      PT Plan Current plan remains appropriate    Co-evaluation              AM-PAC PT "6 Clicks" Mobility   Outcome Measure  Help needed turning from your back to your side while in a flat bed without using bedrails?: None Help needed moving from lying on your back to sitting on the side of a flat bed without using bedrails?: None Help needed moving to and from a bed to a chair (including a wheelchair)?: None Help needed standing up from a chair using your arms (e.g., wheelchair or bedside chair)?: None Help needed to walk in hospital room?: A Little Help needed climbing 3-5 steps with a railing? : A Little 6 Click Score: 22    End of Session Equipment Utilized During Treatment: Gait belt;Back brace Activity Tolerance: Patient tolerated treatment well Patient left: in chair;with call bell/phone within reach Nurse Communication: Mobility status PT Visit Diagnosis: Difficulty in walking, not elsewhere classified (R26.2);Pain Pain - Right/Left: Right Pain - part of body: Hip(back)     Time: [id number] PT Time Calculation (min) (ACUTE ONLY): 13 min  Charges:  $Gait Training: 8-22 mins                    Jalaiya Oyster E, PT [place] Pager [phone]  Office [phone]    Simar Pothier D Disha Cottam 10/29/2019, 11:10 AM

## 2019-11-11 ENCOUNTER — Other Ambulatory Visit: Payer: Self-pay | Admitting: Cardiology

## 2019-11-11 DIAGNOSIS — I10 Essential (primary) hypertension: Secondary | ICD-10-CM

## 2019-11-13 DIAGNOSIS — M48061 Spinal stenosis, lumbar region without neurogenic claudication: Secondary | ICD-10-CM | POA: Diagnosis not present

## 2019-12-08 ENCOUNTER — Telehealth: Payer: Self-pay | Admitting: Cardiology

## 2019-12-08 NOTE — Telephone Encounter (Signed)
Left message to call back  

## 2019-12-08 NOTE — Telephone Encounter (Signed)
Spoke with pt who report he had back surgery on 3/23 and 2 days later had swelling in both legs. He report he had an old prescription for lasix 20 mg that he took on 3/27. He report swelling continues to come and go even with wearing compression stocking associated with burning and tingling in both legs. He report taking another lasix today. Pt also state wife report swelling in back area where he had surgery. Nurse advised pt to contact surgeon to make aware.    Pt also wanted to make Dr. [name] aware that he had some chest discomfort last week that was resolved with 1 nitro. Pt denies symptoms at this moment. Will route to MD for further recommendations.

## 2019-12-08 NOTE — Telephone Encounter (Signed)
OK to take PRN Lasix for one or two more days.  He can schedule to see me or an APP if the swelling continues or he has more chest pain.

## 2019-12-08 NOTE — Telephone Encounter (Signed)
Spoke with patient. Informed him of recommendations from Dr. [name]. Patient verbalized understanding.

## 2019-12-08 NOTE — Telephone Encounter (Signed)
New Message     Pt c/o swelling: STAT is pt has developed SOB within 24 hours  1) How much weight have you gained and in what time span? Has not weight himself, but he doesn't think so   2) If swelling, where is the swelling located? Both legs, burning sensation and tingling.  He says most days he wears compression socks. BP 125/62 HR 71   3) Are you currently taking a fluid pill? Yes   4) Are you currently SOB? No, pt says he has had a little chest pain a couple of times, but it was nothing major. He did use Nitroglycerin   5) Do you have a log of your daily weights (if so, list)? No   Have you gained 3 pounds in a day or 5 pounds in a week? Doesn't know   6) Have you traveled recently? No

## 2019-12-09 ENCOUNTER — Other Ambulatory Visit: Payer: Self-pay

## 2019-12-09 ENCOUNTER — Ambulatory Visit (HOSPITAL_COMMUNITY)
Admission: RE | Admit: 2019-12-09 | Discharge: 2019-12-09 | Disposition: A | Discharge status: Home / Self Care | Payer: Medicare Other | Source: Ambulatory Visit | Attending: Neurological Surgery | Admitting: Neurological Surgery

## 2019-12-09 ENCOUNTER — Other Ambulatory Visit (HOSPITAL_COMMUNITY): Payer: Self-pay | Admitting: Neurological Surgery

## 2019-12-09 DIAGNOSIS — M48061 Spinal stenosis, lumbar region without neurogenic claudication: Secondary | ICD-10-CM | POA: Diagnosis not present

## 2019-12-09 DIAGNOSIS — R609 Edema, unspecified: Secondary | ICD-10-CM

## 2019-12-09 NOTE — Progress Notes (Signed)
Bilateral lower extremity venous duplex has been completed. Preliminary results can be found in CV Proc through chart review.  Results were given to Susie at Dr. Clarice Pole office.  12/09/19 1:49 PM Carlos Levering RVT

## 2019-12-22 DIAGNOSIS — B351 Tinea unguium: Secondary | ICD-10-CM | POA: Diagnosis not present

## 2019-12-22 DIAGNOSIS — M79676 Pain in unspecified toe(s): Secondary | ICD-10-CM | POA: Diagnosis not present

## 2019-12-23 ENCOUNTER — Ambulatory Visit (INDEPENDENT_AMBULATORY_CARE_PROVIDER_SITE_OTHER): Payer: Medicare Other | Admitting: Family Medicine

## 2019-12-23 ENCOUNTER — Other Ambulatory Visit: Payer: Self-pay

## 2019-12-23 ENCOUNTER — Ambulatory Visit: Payer: BC Managed Care – PPO | Admitting: Family Medicine

## 2019-12-23 VITALS — BP 138/84 | HR 56 | Temp 97.8°F | Ht 70.0 in | Wt 198.0 lb

## 2019-12-23 DIAGNOSIS — G8929 Other chronic pain: Secondary | ICD-10-CM

## 2019-12-23 DIAGNOSIS — E039 Hypothyroidism, unspecified: Secondary | ICD-10-CM | POA: Diagnosis not present

## 2019-12-23 DIAGNOSIS — I10 Essential (primary) hypertension: Secondary | ICD-10-CM

## 2019-12-23 DIAGNOSIS — D649 Anemia, unspecified: Secondary | ICD-10-CM | POA: Diagnosis not present

## 2019-12-23 DIAGNOSIS — M5440 Lumbago with sciatica, unspecified side: Secondary | ICD-10-CM

## 2019-12-23 DIAGNOSIS — Z7689 Persons encountering health services in other specified circumstances: Secondary | ICD-10-CM | POA: Diagnosis not present

## 2019-12-23 NOTE — Patient Instructions (Signed)

## 2019-12-23 NOTE — Progress Notes (Signed)
Subjective: CC: Establish care, hypertension, hypothyroidism PCP: Janora Norlander, DO [id number] Jeffrey Hooper is a 59 y.o. male presenting to clinic today for:  1.  Hypertension with hyperlipidemia and CAD History: History of stent placed for 95% blockage.  Patient's cardiologist is Dr. [name]  He reports compliance with Norvasc, hydrochlorothiazide and atorvastatin.  Does not report any chest pain, shortness of breath.  Lower extremity edema has improved after discontinuation of gabapentin and short course of Lasix.  2.  Hypothyroidism, acquired History: No history of radiation to the neck but has had C-spine surgery.  There is a family history in both brother and sister of thyroid disease  Patient reports compliance with Synthroid 100 mcg daily.  Is not report any bowel dysfunction, tremor.  He does have occasional heart palpitations but this is baseline.  3.  Chronic back pain History: Several back surgeries in the past including C-spine, Thoracics and lumbar.  Patient with recent hardware placed in the lumbosacral region.  He has follow-up with his orthopedist in about 2 to 3 weeks at which point he hopes he will be out of the brace.  He has ongoing pain but this is somewhat improved from previous.  Pain does radiate down lower extremity.  He is treated with opioids.  Had swelling with gabapentin as above.  ROS: Per HPI  Allergies  Allergen Reactions  . Alpha-Gal Rash and Other (See Comments)    SEVERE ALLERGY TO ANY MEAT OR MEAT DERIVED PRODUCTS FROM 4 LEGGED ANIMALS > > BEEF, PORK , GOATS, DEER, ETC. < < RESULT OF BITE FROM LONE STAR TICK  . Beef (Bovine) Protein Rash and Other (See Comments)  . Beef-Derived Products Rash and Other (See Comments)  . Lambs Quarters Rash and Other (See Comments)  . Pork-Derived Products Rash and Other (See Comments)  . Prednisone Other (See Comments)    im depo medrol caused dizziness - pt passed out. Ask pt before giving. Pt can take oral  prednisone   . Brilinta [Ticagrelor] Hives    Pt ate pork on the same day he took Brilinta before knowing he had alpha gal. May have been the alpha gal reaction, pt is unsure.   . Doxycycline Hives  . Lyrica [Pregabalin]     Dry mouth and felt raw  . Methylprednisolone Sodium Succ     im depo medrol caused dizziness - pt passed out. Ask pt before giving.  [name] Penicillins Hives    Did it involve swelling of the face/tongue/throat, SOB, or low BP? No Did it involve sudden or severe rash/hives, skin peeling, or any reaction on the inside of your mouth or nose? No Did you need to seek medical attention at a hospital or doctor's office? No When did it last happen?10 + years ago If all above answers are "NO", may proceed with cephalosporin use.   . Shellfish Allergy Hives  . Adhesive [Tape] Rash  . Fentanyl Rash    Reaction to adhesive, not the drug    Past Medical History:  Diagnosis Date  . Allergy   . Anxiety   . Arthritis   . CAD (coronary artery disease), native coronary artery 12/03/2015  . Coronary artery disease   . Dysrhythmia    "irregular and PAC'S"  . GERD (gastroesophageal reflux disease)   . Head injury with loss of consciousness ([place])    back in the 1980's  . History of kidney stones   . Hyperlipidemia   . Hypertension   . Hypothyroid   .  Multiple benign nevi   . Neuromuscular disorder ([place]) 2011   LEFT RADIAL NERVE SURGERY R/T TRAUMA  . Pneumonia 2014, 2017  . Prostatism   . Pulmonary nodule     Current Outpatient Medications:  .  acetaminophen (TYLENOL) 500 MG tablet, Take 1,000 mg by mouth daily as needed for mild pain or headache. , Disp: , Rfl:  .  amLODipine (NORVASC) 5 MG tablet, TAKE 1.5 TABLETS (7.5 MG TOTAL) BY MOUTH DAILY., Disp: 135 tablet, Rfl: 3 .  aspirin EC 81 MG tablet, Take 81 mg by mouth at bedtime., Disp: , Rfl:  .  atorvastatin (LIPITOR) 80 MG tablet, Take 1 tablet (80 mg total) by mouth at bedtime., Disp: 90 tablet, Rfl: 2 .   betamethasone dipropionate (DIPROLENE) 0.05 % cream, APPLY TO AFFECTED AREA TWICE A DAY (Patient taking differently: Apply 1 application topically daily as needed (hemorrhoids). ), Disp: 30 g, Rfl: 0 .  dexamethasone (DECADRON) 1 MG tablet, 2 tablets twice daily for 2 days, one tablet twice daily for 2 days, one tablet daily for 2 days., Disp: 15 tablet, Rfl: 01 .  diazepam (VALIUM) 5 MG tablet, Take 1 tablet (5 mg total) by mouth every 6 (six) hours as needed for muscle spasms., Disp: 60 tablet, Rfl: 0 .  diphenhydrAMINE (BENADRYL) 25 MG tablet, Take 25 mg by mouth 2 (two) times daily as needed for itching. , Disp: , Rfl:  .  EPINEPHrine 0.3 mg/0.3 mL IJ SOAJ injection, Inject 0.3 mLs (0.3 mg total) into the muscle as needed for anaphylaxis., Disp: 1 Device, Rfl: 3 .  fluticasone (FLONASE) 50 MCG/ACT nasal spray, SPRAY 2 SPRAYS INTO EACH NOSTRIL EVERY DAY (Patient taking differently: Place 2 sprays into both nostrils daily. ), Disp: 48 mL, Rfl: 1 .  hydrochlorothiazide (HYDRODIURIL) 50 MG tablet, Take 1 tablet (50 mg total) by mouth daily., Disp: 90 tablet, Rfl: 1 .  HYDROcodone-acetaminophen (NORCO/VICODIN) 5-325 MG tablet, Take 1 tablet by mouth every 6 (six) hours as needed. for pain, Disp: , Rfl:  .  ketoconazole (NIZORAL) 2 % cream, Apply 1 application topically daily., Disp: , Rfl:  .  levocetirizine (XYZAL) 5 MG tablet, Take 1 tablet (5 mg total) by mouth every evening., Disp: 90 tablet, Rfl: 3 .  levothyroxine (SYNTHROID) 100 MCG tablet, Take 1 tablet (100 mcg total) by mouth daily., Disp: 90 tablet, Rfl: 2 .  nitroGLYCERIN (NITROSTAT) 0.4 MG SL tablet, Place 1 tablet (0.4 mg total) under the tongue every 5 (five) minutes as needed for chest pain., Disp: 20 tablet, Rfl: 2 .  oxyCODONE-acetaminophen (PERCOCET/ROXICET) 5-325 MG tablet, Take 1-2 tablets by mouth every 4 (four) hours as needed for moderate pain or severe pain., Disp: 60 tablet, Rfl: 0 .  pantoprazole (PROTONIX) 40 MG tablet, Take 1  tablet (40 mg total) by mouth daily., Disp: 90 tablet, Rfl: 3 .  PARoxetine (PAXIL) 20 MG tablet, Take 1 tablet (20 mg total) by mouth daily. (Patient taking differently: Take 20 mg by mouth every evening. ), Disp: 90 tablet, Rfl: 1 Social History   Socioeconomic History  . Marital status: Married    Spouse name: Not on file  . Number of children: 3  . Years of education: Not on file  . Highest education level: Not on file  Occupational History  . Occupation: [employer] for [employer]  Tobacco Use  . Smoking status: Former Smoker    Packs/day: 1.00    Years: 20.00    Pack years: 20.00  Types: Cigarettes    Quit date: 11/28/2006    Years since quitting: 13.0  . Smokeless tobacco: Never Used  Substance and Sexual Activity  . Alcohol use: Yes    Alcohol/week: 0.0 standard drinks    Comment: rarely  . Drug use: No  . Sexual activity: Yes  Other Topics Concern  . Not on file  Social History Narrative   Lives at home with wife, new 74 year old adopted boy.     Social Determinants of Health   Financial Resource Strain:   . Difficulty of Paying Living Expenses:   Food Insecurity:   . Worried About Charity fundraiser in the Last Year:   . Arboriculturist in the Last Year:   Transportation Needs:   . Film/video editor (Medical):   [name] Lack of Transportation (Non-Medical):   Physical Activity:   . Days of Exercise per Week:   . Minutes of Exercise per Session:   Stress:   . Feeling of Stress :   Social Connections:   . Frequency of Communication with Friends and Family:   . Frequency of Social Gatherings with Friends and Family:   . Attends Religious Services:   . Active Member of Clubs or Organizations:   . Attends Archivist Meetings:   [name] Marital Status:   Intimate Partner Violence:   . Fear of Current or Ex-Partner:   . Emotionally Abused:   [name] Physically Abused:   . Sexually Abused:    Family History  Problem Relation Age of Onset  . Heart  failure Mother 54       Died age 54  . Congestive Heart Failure Mother   . Heart attack Father 7       Died with MI  . Heart attack Paternal Grandmother   . Heart attack Paternal Grandfather   . Colon cancer Neg Hx     Objective: Office vital signs reviewed. BP 138/84   Pulse (!) 56   Temp 97.8 F (36.6 C) (Oral)   Ht 5\' 10"  (1.778 m)   Wt 198 lb (89.8 kg)   SpO2 98%   BMI 28.41 kg/m   Physical Examination:  General: Awake, alert, well nourished, No acute distress HEENT: Normal, sclera white, no exophthalmos.  Moist mucous membranes Cardio: Slightly bradycardic with regular rhythm, S1S2 heard, no murmurs appreciated Pulm: clear to auscultation bilaterally, no wheezes, rhonchi or rales; normal work of breathing on room air Extremities: warm, well perfused, No edema, cyanosis or clubbing; +2 pulses bilaterally MSK: antlagic gait and station; wearing lumbar brace Skin: dry; intact; no rashes or lesions; slightly cool temperature Neuro: No resting tremor  Assessment/ Plan: 59 y.o. male   1. Essential hypertension Well-controlled  2. Acquired hypothyroidism Asymptomatic - Hepatic Function Panel - Thyroid Panel With TSH  3. Establishing care with new doctor, encounter for  4. Anemia, unspecified type Likely postop anemia.  Recheck CBC for normalization - CBC  5. Chronic bilateral low back pain with sciatica, sciatica laterality unspecified S/p surgery.  Has follow up with ortho soon.  No orders of the defined types were placed in this encounter.  No orders of the defined types were placed in this encounter.    Janora Norlander, DO [place] [phone]

## 2019-12-24 LAB — HEPATIC FUNCTION PANEL
ALT: 28 IU/L (ref 0–44)
AST: 18 IU/L (ref 0–40)
Albumin: 4 g/dL (ref 3.8–4.9)
Alkaline Phosphatase: 149 IU/L — ABNORMAL HIGH (ref 48–121)
Bilirubin Total: 0.7 mg/dL (ref 0.0–1.2)
Bilirubin, Direct: 0.21 mg/dL (ref 0.00–0.40)
Total Protein: 6.1 g/dL (ref 6.0–8.5)

## 2019-12-24 LAB — CBC
Hematocrit: 40.3 % (ref 37.5–51.0)
Hemoglobin: 12.8 g/dL — ABNORMAL LOW (ref 13.0–17.7)
MCH: 27.2 pg (ref 26.6–33.0)
MCHC: 31.8 g/dL (ref 31.5–35.7)
MCV: 86 fL (ref 79–97)
Platelets: 260 10*3/uL (ref 150–450)
RBC: 4.71 x10E6/uL (ref 4.14–5.80)
RDW: 13.5 % (ref 11.6–15.4)
WBC: 11.4 10*3/uL — ABNORMAL HIGH (ref 3.4–10.8)

## 2019-12-24 LAB — THYROID PANEL WITH TSH
Free Thyroxine Index: 3.1 (ref 1.2–4.9)
T3 Uptake Ratio: 30 % (ref 24–39)
T4, Total: 10.3 ug/dL (ref 4.5–12.0)
TSH: 3.95 u[IU]/mL (ref 0.450–4.500)

## 2020-02-03 ENCOUNTER — Other Ambulatory Visit: Payer: Self-pay | Admitting: *Deleted

## 2020-02-03 DIAGNOSIS — I10 Essential (primary) hypertension: Secondary | ICD-10-CM

## 2020-02-03 MED ORDER — HYDROCHLOROTHIAZIDE 50 MG PO TABS: 50.0000 mg | ORAL_TABLET | Freq: Every day | ORAL | 1 refills | Status: DC

## 2020-02-26 ENCOUNTER — Other Ambulatory Visit: Payer: Self-pay | Admitting: *Deleted

## 2020-02-26 DIAGNOSIS — J3089 Other allergic rhinitis: Secondary | ICD-10-CM

## 2020-02-26 MED ORDER — LEVOCETIRIZINE DIHYDROCHLORIDE 5 MG PO TABS: 5.0000 mg | ORAL_TABLET | Freq: Every evening | ORAL | 1 refills | Status: DC

## 2020-03-11 DIAGNOSIS — M5416 Radiculopathy, lumbar region: Secondary | ICD-10-CM | POA: Diagnosis not present

## 2020-03-11 DIAGNOSIS — Z6829 Body mass index (BMI) 29.0-29.9, adult: Secondary | ICD-10-CM | POA: Diagnosis not present

## 2020-03-22 ENCOUNTER — Ambulatory Visit (INDEPENDENT_AMBULATORY_CARE_PROVIDER_SITE_OTHER): Payer: Medicare Other | Admitting: Family Medicine

## 2020-03-22 ENCOUNTER — Encounter: Payer: Self-pay | Admitting: Family Medicine

## 2020-03-22 ENCOUNTER — Other Ambulatory Visit: Payer: Self-pay

## 2020-03-22 VITALS — BP 107/57 | HR 62 | Temp 98.1°F | Ht 70.0 in | Wt 198.4 lb

## 2020-03-22 DIAGNOSIS — R110 Nausea: Secondary | ICD-10-CM

## 2020-03-22 DIAGNOSIS — I10 Essential (primary) hypertension: Secondary | ICD-10-CM | POA: Diagnosis not present

## 2020-03-22 DIAGNOSIS — D72829 Elevated white blood cell count, unspecified: Secondary | ICD-10-CM

## 2020-03-22 DIAGNOSIS — E039 Hypothyroidism, unspecified: Secondary | ICD-10-CM

## 2020-03-22 DIAGNOSIS — L299 Pruritus, unspecified: Secondary | ICD-10-CM

## 2020-03-22 NOTE — Progress Notes (Addendum)
Subjective: CC: Follow up hypertension, hypothyroidism PCP: Janora Norlander, DO [id number] D Jeffrey Hooper is a 59 y.o. male presenting to clinic today for:  1.  Hypertension with hyperlipidemia and CAD History: History of stent placed for 95% blockage.  Patient's cardiologist is Dr. [name]  He reports compliance with Norvasc, hydrochlorothiazide and atorvastatin.  No chest pain, shortness of breath, edema.  2.  Hypothyroidism, acquired History: No history of radiation to the neck but has had C-spine surgery.  There is a family history in both brother and sister of thyroid disease  Patient reports compliance with Synthroid 100 mcg daily.  Seems to be doing well from that standpoint.  Denies any change in voice, tremor, heart palpitations.  He has had some nausea that seems to be postprandial but occasionally will occur at random.  He reports compliance with Protonix 40 mg daily.  He has been taking meloxicam 7.5 mg twice daily for his back, this was a recent addition.  He thinks that the symptoms started prior to initiation of the NSAID however.  3.  Allergy Patient uses Xyzal for allergic pruritus.  His insurance stopped paying for this and he is wondering if there are any alternatives to the medication.  He has been using Benadryl twice daily for itching.  ROS: Per HPI  Allergies  Allergen Reactions  . Alpha-Gal Rash and Other (See Comments)    SEVERE ALLERGY TO ANY MEAT OR MEAT DERIVED PRODUCTS FROM 4 LEGGED ANIMALS > > BEEF, PORK , GOATS, DEER, ETC. < < RESULT OF BITE FROM LONE STAR TICK  . Beef (Bovine) Protein Rash and Other (See Comments)  . Beef-Derived Products Rash and Other (See Comments)  . Lambs Quarters Rash and Other (See Comments)  . Pork-Derived Products Rash and Other (See Comments)  . Prednisone Other (See Comments)    im depo medrol caused dizziness - pt passed out. Ask pt before giving. Pt can take oral prednisone   . Brilinta [Ticagrelor] Hives    Pt ate  pork on the same day he took Brilinta before knowing he had alpha gal. May have been the alpha gal reaction, pt is unsure.   . Doxycycline Hives  . Gabapentin Swelling  . Lyrica [Pregabalin]     Dry mouth and felt raw  . Methylprednisolone Sodium Succ     im depo medrol caused dizziness - pt passed out. Ask pt before giving.  [name] Penicillins Hives    Did it involve swelling of the face/tongue/throat, SOB, or low BP? No Did it involve sudden or severe rash/hives, skin peeling, or any reaction on the inside of your mouth or nose? No Did you need to seek medical attention at a hospital or doctor's office? No When did it last happen?10 + years ago If all above answers are "NO", may proceed with cephalosporin use.   . Shellfish Allergy Hives  . Adhesive [Tape] Rash  . Fentanyl Rash    Reaction to adhesive, not the drug    Past Medical History:  Diagnosis Date  . Allergy   . Anxiety   . Arthritis   . CAD (coronary artery disease), native coronary artery 12/03/2015  . Coronary artery disease   . Dysrhythmia    "irregular and PAC'S"  . GERD (gastroesophageal reflux disease)   . Head injury with loss of consciousness ([place])    back in the 1980's  . History of kidney stones   . Hyperlipidemia   . Hypertension   . Hypothyroid   .  Multiple benign nevi   . Neuromuscular disorder ([place]) 2011   LEFT RADIAL NERVE SURGERY R/T TRAUMA  . Pneumonia 2014, 2017  . Prostatism   . Pulmonary nodule     Current Outpatient Medications:  .  acetaminophen (TYLENOL) 500 MG tablet, Take 1,000 mg by mouth daily as needed for mild pain or headache. , Disp: , Rfl:  .  amLODipine (NORVASC) 5 MG tablet, TAKE 1.5 TABLETS (7.5 MG TOTAL) BY MOUTH DAILY., Disp: 135 tablet, Rfl: 3 .  aspirin EC 81 MG tablet, Take 81 mg by mouth at bedtime., Disp: , Rfl:  .  atorvastatin (LIPITOR) 80 MG tablet, Take 1 tablet (80 mg total) by mouth at bedtime., Disp: 90 tablet, Rfl: 2 .  betamethasone dipropionate (DIPROLENE)  0.05 % cream, APPLY TO AFFECTED AREA TWICE A DAY (Patient taking differently: Apply 1 application topically daily as needed (hemorrhoids). ), Disp: 30 g, Rfl: 0 .  diphenhydrAMINE (BENADRYL) 25 MG tablet, Take 25 mg by mouth 2 (two) times daily as needed for itching. , Disp: , Rfl:  .  EPINEPHrine 0.3 mg/0.3 mL IJ SOAJ injection, Inject 0.3 mLs (0.3 mg total) into the muscle as needed for anaphylaxis., Disp: 1 Device, Rfl: 3 .  fluticasone (FLONASE) 50 MCG/ACT nasal spray, SPRAY 2 SPRAYS INTO EACH NOSTRIL EVERY DAY (Patient taking differently: Place 2 sprays into both nostrils daily. ), Disp: 48 mL, Rfl: 1 .  hydrochlorothiazide (HYDRODIURIL) 50 MG tablet, Take 1 tablet (50 mg total) by mouth daily., Disp: 90 tablet, Rfl: 1 .  HYDROcodone-acetaminophen (NORCO/VICODIN) 5-325 MG tablet, Take 1 tablet by mouth every 6 (six) hours as needed. for pain, Disp: , Rfl:  .  ketoconazole (NIZORAL) 2 % cream, Apply 1 application topically daily., Disp: , Rfl:  .  levocetirizine (XYZAL) 5 MG tablet, Take 1 tablet (5 mg total) by mouth every evening., Disp: 90 tablet, Rfl: 1 .  levothyroxine (SYNTHROID) 100 MCG tablet, Take 1 tablet (100 mcg total) by mouth daily., Disp: 90 tablet, Rfl: 2 .  meloxicam (MOBIC) 7.5 MG tablet, Take 7.5 mg by mouth in the morning and at bedtime., Disp: , Rfl:  .  nitroGLYCERIN (NITROSTAT) 0.4 MG SL tablet, Place 1 tablet (0.4 mg total) under the tongue every 5 (five) minutes as needed for chest pain., Disp: 20 tablet, Rfl: 2 .  pantoprazole (PROTONIX) 40 MG tablet, Take 1 tablet (40 mg total) by mouth daily., Disp: 90 tablet, Rfl: 3 .  PARoxetine (PAXIL) 20 MG tablet, Take 1 tablet (20 mg total) by mouth daily. (Patient taking differently: Take 20 mg by mouth every evening. ), Disp: 90 tablet, Rfl: 1 Social History   Socioeconomic History  . Marital status: Married    Spouse name: Not on file  . Number of children: 3  . Years of education: Not on file  . Highest education level:  Not on file  Occupational History  . Occupation: [employer] for [employer]  Tobacco Use  . Smoking status: Former Smoker    Packs/day: 1.00    Years: 20.00    Pack years: 20.00    Types: Cigarettes    Quit date: 11/28/2006    Years since quitting: 13.3  . Smokeless tobacco: Never Used  Vaping Use  . Vaping Use: Never used  Substance and Sexual Activity  . Alcohol use: Yes    Alcohol/week: 0.0 standard drinks    Comment: rarely  . Drug use: No  . Sexual activity: Yes  Other Topics Concern  .  Not on file  Social History Narrative   Lives at home with wife, new 17 year old adopted boy.     Social Determinants of Health   Financial Resource Strain:   . Difficulty of Paying Living Expenses:   Food Insecurity:   . Worried About Charity fundraiser in the Last Year:   . Arboriculturist in the Last Year:   Transportation Needs:   . Film/video editor (Medical):   [name] Lack of Transportation (Non-Medical):   Physical Activity:   . Days of Exercise per Week:   . Minutes of Exercise per Session:   Stress:   . Feeling of Stress :   Social Connections:   . Frequency of Communication with Friends and Family:   . Frequency of Social Gatherings with Friends and Family:   . Attends Religious Services:   . Active Member of Clubs or Organizations:   . Attends Archivist Meetings:   [name] Marital Status:   Intimate Partner Violence:   . Fear of Current or Ex-Partner:   . Emotionally Abused:   [name] Physically Abused:   . Sexually Abused:    Family History  Problem Relation Age of Onset  . Heart failure Mother 33       Died age 70  . Congestive Heart Failure Mother   . Heart attack Father 30       Died with MI  . Heart attack Paternal Grandmother   . Heart attack Paternal Grandfather   . Colon cancer Neg Hx     Objective: Office vital signs reviewed. BP (!) 107/57   Pulse 62   Temp 98.1 F (36.7 C)   Ht 5\' 10"  (1.778 m)   Wt 198 lb 6.4 oz (90 kg)   SpO2 99%    BMI 28.47 kg/m   Physical Examination:  General: Awake, alert, well nourished, No acute distress HEENT: Normal, sclera white, no exophthalmos.  No goiter Cardio: Slightly bradycardic with regular rhythm, S1S2 heard, no murmurs appreciated Pulm: clear to auscultation bilaterally, no wheezes, rhonchi or rales; normal work of breathing on room air Extremities: warm, well perfused, No edema, cyanosis or clubbing; +2 pulses bilaterally MSK: Using cane for ambulation Neuro: No resting tremor  Assessment/ Plan: 59 y.o. male   1. Essential hypertension Controlled.  Continue current regimen - Basic Metabolic Panel  2. Acquired hypothyroidism Asymptomatic. - Thyroid Panel With TSH  3. Leukocytosis, unspecified type Check CBC - CBC with Differential  4. Postprandial nausea Advised to increase Protonix to 40 mg twice daily for the next week.  If this is effective in controlling intermittent nausea, we will change his medication to reflect usage.  Otherwise would resume daily use and follow-up  5. Pruritus Consider purchasing OTC at [facility] versus switching to Zyrtec if cheaper.  Would avoid overuse of Benadryl given sedation and short half-life   No orders of the defined types were placed in this encounter.  No orders of the defined types were placed in this encounter.    Janora Norlander, DO [place] [phone]

## 2020-03-23 LAB — BASIC METABOLIC PANEL
BUN/Creatinine Ratio: 13 (ref 9–20)
BUN: 14 mg/dL (ref 6–24)
CO2: 27 mmol/L (ref 20–29)
Calcium: 9.5 mg/dL (ref 8.7–10.2)
Chloride: 98 mmol/L (ref 96–106)
Creatinine, Ser: 1.06 mg/dL (ref 0.76–1.27)
GFR calc Af Amer: 89 mL/min/{1.73_m2} (ref >59)
GFR calc non Af Amer: 77 mL/min/{1.73_m2} (ref >59)
Glucose: 90 mg/dL (ref 65–99)
Potassium: 3.8 mmol/L (ref 3.5–5.2)
Sodium: 139 mmol/L (ref 134–144)

## 2020-03-23 LAB — CBC WITH DIFFERENTIAL/PLATELET
Basophils Absolute: 0 10*3/uL (ref 0.0–0.2)
Basos: 1 %
EOS (ABSOLUTE): 0.1 10*3/uL (ref 0.0–0.4)
Eos: 2 %
Hematocrit: 40 % (ref 37.5–51.0)
Hemoglobin: 13 g/dL (ref 13.0–17.7)
Immature Grans (Abs): 0 10*3/uL (ref 0.0–0.1)
Immature Granulocytes: 0 %
Lymphocytes Absolute: 0.8 10*3/uL (ref 0.7–3.1)
Lymphs: 13 %
MCH: 26.7 pg (ref 26.6–33.0)
MCHC: 32.5 g/dL (ref 31.5–35.7)
MCV: 82 fL (ref 79–97)
Monocytes Absolute: 0.6 10*3/uL (ref 0.1–0.9)
Monocytes: 10 %
Neutrophils Absolute: 4.7 10*3/uL (ref 1.4–7.0)
Neutrophils: 74 %
Platelets: 240 10*3/uL (ref 150–450)
RBC: 4.87 x10E6/uL (ref 4.14–5.80)
RDW: 15.8 % — ABNORMAL HIGH (ref 11.6–15.4)
WBC: 6.3 10*3/uL (ref 3.4–10.8)

## 2020-03-23 LAB — THYROID PANEL WITH TSH
Free Thyroxine Index: 3.2 (ref 1.2–4.9)
T3 Uptake Ratio: 25 % (ref 24–39)
T4, Total: 12.8 ug/dL — ABNORMAL HIGH (ref 4.5–12.0)
TSH: 1.16 u[IU]/mL (ref 0.450–4.500)

## 2020-03-24 ENCOUNTER — Ambulatory Visit: Payer: Medicare Other | Admitting: Family Medicine

## 2020-03-28 ENCOUNTER — Other Ambulatory Visit: Payer: Self-pay | Admitting: Family Medicine

## 2020-03-28 DIAGNOSIS — E039 Hypothyroidism, unspecified: Secondary | ICD-10-CM

## 2020-03-28 MED ORDER — LEVOTHYROXINE SODIUM 100 MCG PO TABS: 100.0000 ug | ORAL_TABLET | Freq: Every day | ORAL | 2 refills | Status: DC

## 2020-03-29 DIAGNOSIS — D225 Melanocytic nevi of trunk: Secondary | ICD-10-CM | POA: Diagnosis not present

## 2020-03-29 DIAGNOSIS — D485 Neoplasm of uncertain behavior of skin: Secondary | ICD-10-CM | POA: Diagnosis not present

## 2020-03-29 DIAGNOSIS — L570 Actinic keratosis: Secondary | ICD-10-CM | POA: Diagnosis not present

## 2020-03-29 DIAGNOSIS — L820 Inflamed seborrheic keratosis: Secondary | ICD-10-CM | POA: Diagnosis not present

## 2020-04-06 HISTORY — PX: OTHER SURGICAL HISTORY: SHX169

## 2020-04-19 ENCOUNTER — Other Ambulatory Visit: Payer: Self-pay | Admitting: Family Medicine

## 2020-04-19 DIAGNOSIS — T7800XD Anaphylactic reaction due to unspecified food, subsequent encounter: Secondary | ICD-10-CM

## 2020-04-21 DIAGNOSIS — M79676 Pain in unspecified toe(s): Secondary | ICD-10-CM | POA: Diagnosis not present

## 2020-04-21 DIAGNOSIS — B351 Tinea unguium: Secondary | ICD-10-CM | POA: Diagnosis not present

## 2020-04-21 DIAGNOSIS — L03031 Cellulitis of right toe: Secondary | ICD-10-CM | POA: Diagnosis not present

## 2020-05-03 ENCOUNTER — Other Ambulatory Visit: Payer: Self-pay | Admitting: Family Medicine

## 2020-05-03 DIAGNOSIS — K219 Gastro-esophageal reflux disease without esophagitis: Secondary | ICD-10-CM

## 2020-05-03 NOTE — Telephone Encounter (Signed)
Pt reports that twice a day Protonix does help the intermittent nausea and would like to change the Rx and have it sent into [facility]

## 2020-05-03 NOTE — Telephone Encounter (Signed)
  Prescription Request  05/03/2020  What is the name of the medication or equipment? protonics  Have you contacted your pharmacy to request a refill? (if applicable) Yes  Which pharmacy would you like this sent to? [facility]   Patient notified that their request is being sent to the clinical staff for review and that they should receive a response within 2 business days.   Gottschalk's pt  They are suppose to up his meds to 1 a day to 2 a day...  He doesn't want to run out.

## 2020-05-04 MED ORDER — PANTOPRAZOLE SODIUM 40 MG PO TBEC: 40.0000 mg | DELAYED_RELEASE_TABLET | Freq: Two times a day (BID) | ORAL | 3 refills | Status: DC

## 2020-05-04 NOTE — Telephone Encounter (Signed)
Pt aware prescription sent to pharmacy

## 2020-05-05 DIAGNOSIS — L03031 Cellulitis of right toe: Secondary | ICD-10-CM | POA: Diagnosis not present

## 2020-05-12 ENCOUNTER — Other Ambulatory Visit: Payer: Self-pay | Admitting: *Deleted

## 2020-05-12 DIAGNOSIS — F411 Generalized anxiety disorder: Secondary | ICD-10-CM

## 2020-05-13 MED ORDER — PAROXETINE HCL 20 MG PO TABS: 20.0000 mg | ORAL_TABLET | Freq: Every day | ORAL | 1 refills | Status: DC

## 2020-05-16 ENCOUNTER — Encounter: Payer: Self-pay | Admitting: Family Medicine

## 2020-05-16 ENCOUNTER — Ambulatory Visit (INDEPENDENT_AMBULATORY_CARE_PROVIDER_SITE_OTHER): Payer: Medicare Other | Admitting: Family Medicine

## 2020-05-16 ENCOUNTER — Other Ambulatory Visit: Payer: Self-pay

## 2020-05-16 VITALS — BP 132/79 | HR 62 | Temp 98.9°F | Ht 70.0 in | Wt 202.2 lb

## 2020-05-16 DIAGNOSIS — R1012 Left upper quadrant pain: Secondary | ICD-10-CM | POA: Diagnosis not present

## 2020-05-16 DIAGNOSIS — Z23 Encounter for immunization: Secondary | ICD-10-CM

## 2020-05-16 DIAGNOSIS — K2100 Gastro-esophageal reflux disease with esophagitis, without bleeding: Secondary | ICD-10-CM

## 2020-05-16 MED ORDER — ONDANSETRON 4 MG PO TBDP: 4.0000 mg | ORAL_TABLET | Freq: Three times a day (TID) | ORAL | 0 refills | Status: DC | PRN

## 2020-05-16 NOTE — Patient Instructions (Signed)
Hiatal Hernia  A hiatal hernia occurs when part of the stomach slides above the muscle that separates the abdomen from the chest (diaphragm). A person can be born with a hiatal hernia (congenital), or it may develop over time. In almost all cases of hiatal hernia, only the top part of the stomach pushes through the diaphragm. Many people have a hiatal hernia with no symptoms. The larger the hernia, the more likely it is that you will have symptoms. In some cases, a hiatal hernia allows stomach acid to flow back into the tube that carries food from your mouth to your stomach (esophagus). This may cause heartburn symptoms. Severe heartburn symptoms may mean that you have developed a condition called gastroesophageal reflux disease (GERD). What are the causes? This condition is caused by a weakness in the opening (hiatus) where the esophagus passes through the diaphragm to attach to the upper part of the stomach. A person may be born with a weakness in the hiatus, or a weakness can develop over time. What increases the risk? This condition is more likely to develop in:  Older people. Age is a major risk factor for a hiatal hernia, especially if you are over the age of 50.  Pregnant women.  People who are overweight.  People who have frequent constipation. What are the signs or symptoms? Symptoms of this condition usually develop in the form of GERD symptoms. Symptoms include:  Heartburn.  Belching.  Indigestion.  Trouble swallowing.  Coughing or wheezing.  Sore throat.  Hoarseness.  Chest pain.  Nausea and vomiting. How is this diagnosed? This condition may be diagnosed during testing for GERD. Tests that may be done include:  X-rays of your stomach or chest.  An upper gastrointestinal (GI) series. This is an X-ray exam of your GI tract that is taken after you swallow a chalky liquid that shows up clearly on the X-ray.  Endoscopy. This is a procedure to look into your stomach  using a thin, flexible tube that has a tiny camera and light on the end of it. How is this treated? This condition may be treated by:  Dietary and lifestyle changes to help reduce GERD symptoms.  Medicines. These may include: ? Over-the-counter antacids. ? Medicines that make your stomach empty more quickly. ? Medicines that block the production of stomach acid (H2 blockers). ? Stronger medicines to reduce stomach acid (proton pump inhibitors).  Surgery to repair the hernia, if other treatments are not helping. If you have no symptoms, you may not need treatment. Follow these instructions at home: Lifestyle and activity  Do not use any products that contain nicotine or tobacco, such as cigarettes and e-cigarettes. If you need help quitting, ask your health care provider.  Try to achieve and maintain a healthy body weight.  Avoid putting pressure on your abdomen. Anything that puts pressure on your abdomen increases the amount of acid that may be pushed up into your esophagus. ? Avoid bending over, especially after eating. ? Raise the head of your bed by putting blocks under the legs. This keeps your head and esophagus higher than your stomach. ? Do not wear tight clothing around your chest or stomach. ? Try not to strain when having a bowel movement, when urinating, or when lifting heavy objects. Eating and drinking  Avoid foods that can worsen GERD symptoms. These may include: ? Fatty foods, like fried foods. ? Citrus fruits, like oranges or lemon. ? Other foods and drinks that contain acid, like   orange juice or tomatoes. ? Spicy food. ? Chocolate.  Eat frequent small meals instead of three large meals a day. This helps prevent your stomach from getting too full. ? Eat slowly. ? Do not lie down right after eating. ? Do not eat 1-2 hours before bed.  Do not drink beverages with caffeine. These include cola, coffee, cocoa, and tea.  Do not drink alcohol. General  instructions  Take over-the-counter and prescription medicines only as told by your health care provider.  Keep all follow-up visits as told by your health care provider. This is important. Contact a health care provider if:  Your symptoms are not controlled with medicines or lifestyle changes.  You are having trouble swallowing.  You have coughing or wheezing that will not go away. Get help right away if:  Your pain is getting worse.  Your pain spreads to your arms, neck, jaw, teeth, or back.  You have shortness of breath.  You sweat for no reason.  You feel sick to your stomach (nauseous) or you vomit.  You vomit blood.  You have bright red blood in your stools.  You have black, tarry stools. This information is not intended to replace advice given to you by your health care provider. Make sure you discuss any questions you have with your health care provider. Document Revised: 07/05/2017 Document Reviewed: 02/25/2017 Elsevier Patient Education  2020 Elsevier Inc.  

## 2020-05-16 NOTE — Progress Notes (Signed)
Subjective: CC: Hiatal hernia PCP: Janora Norlander, DO [id number] Jeffrey Hooper is a 59 y.o. male presenting to clinic today for:  1.  Hiatal hernia Patient reports that he has been told that he has a hiatal hernia by his gastroenterologist previously.  He reports that over the last several weeks he has been experiencing increased pressure in the left upper abdomen, decreased appetite and sensation of left upper abdominal fullness.  His PPI was increased to twice daily and this was somewhat helpful but symptoms now seem to be worsening.  He denies any hematochezia or melena but does report nausea.  Bowel movements have been regular   ROS: Per HPI  Allergies  Allergen Reactions  . Alpha-Gal Rash and Other (See Comments)    SEVERE ALLERGY TO ANY MEAT OR MEAT DERIVED PRODUCTS FROM 4 LEGGED ANIMALS > > BEEF, PORK , GOATS, DEER, ETC. < < RESULT OF BITE FROM LONE STAR TICK  . Beef (Bovine) Protein Rash and Other (See Comments)  . Beef-Derived Products Rash and Other (See Comments)  . Lambs Quarters Rash and Other (See Comments)  . Pork-Derived Products Rash and Other (See Comments)  . Prednisone Other (See Comments)    im depo medrol caused dizziness - pt passed out. Ask pt before giving. Pt can take oral prednisone   . Brilinta [Ticagrelor] Hives    Pt ate pork on the same day he took Brilinta before knowing he had alpha gal. May have been the alpha gal reaction, pt is unsure.   . Doxycycline Hives  . Gabapentin Swelling  . Lyrica [Pregabalin]     Dry mouth and felt raw  . Methylprednisolone Sodium Succ     im depo medrol caused dizziness - pt passed out. Ask pt before giving.  [name] Penicillins Hives    Did it involve swelling of the face/tongue/throat, SOB, or low BP? No Did it involve sudden or severe rash/hives, skin peeling, or any reaction on the inside of your mouth or nose? No Did you need to seek medical attention at a hospital or doctor's office? No When did it last  happen?10 + years ago If all above answers are "NO", may proceed with cephalosporin use.   . Shellfish Allergy Hives  . Adhesive [Tape] Rash  . Fentanyl Rash    Reaction to adhesive, not the drug    Past Medical History:  Diagnosis Date  . Allergy   . Anxiety   . Arthritis   . CAD (coronary artery disease), native coronary artery 12/03/2015  . Coronary artery disease   . Dysrhythmia    "irregular and PAC'S"  . GERD (gastroesophageal reflux disease)   . Head injury with loss of consciousness ([place])    back in the 1980's  . History of kidney stones   . Hyperlipidemia   . Hypertension   . Hypothyroid   . Multiple benign nevi   . Neuromuscular disorder ([place]) 2011   LEFT RADIAL NERVE SURGERY R/T TRAUMA  . Pneumonia 2014, 2017  . Prostatism   . Pulmonary nodule     Current Outpatient Medications:  .  acetaminophen (TYLENOL) 500 MG tablet, Take 1,000 mg by mouth daily as needed for mild pain or headache. , Disp: , Rfl:  .  amLODipine (NORVASC) 5 MG tablet, TAKE 1.5 TABLETS (7.5 MG TOTAL) BY MOUTH DAILY., Disp: 135 tablet, Rfl: 3 .  aspirin EC 81 MG tablet, Take 81 mg by mouth at bedtime., Disp: , Rfl:  .  atorvastatin (LIPITOR)  80 MG tablet, Take 1 tablet (80 mg total) by mouth at bedtime., Disp: 90 tablet, Rfl: 2 .  betamethasone dipropionate (DIPROLENE) 0.05 % cream, APPLY TO AFFECTED AREA TWICE A DAY (Patient taking differently: Apply 1 application topically daily as needed (hemorrhoids). ), Disp: 30 g, Rfl: 0 .  diphenhydrAMINE (BENADRYL) 25 MG tablet, Take 25 mg by mouth 2 (two) times daily as needed for itching. , Disp: , Rfl:  .  EPINEPHRINE 0.3 mg/0.3 mL IJ SOAJ injection, INJECT 0.3 MLS (0.3 MG TOTAL) INTO THE MUSCLE AS NEEDED FOR ANAPHYLAXIS., Disp: 2 each, Rfl: 3 .  fluticasone (FLONASE) 50 MCG/ACT nasal spray, SPRAY 2 SPRAYS INTO EACH NOSTRIL EVERY DAY (Patient taking differently: Place 2 sprays into both nostrils daily. ), Disp: 48 mL, Rfl: 1 .   HYDROcodone-acetaminophen (NORCO/VICODIN) 5-325 MG tablet, Take 1 tablet by mouth every 6 (six) hours as needed. for pain, Disp: , Rfl:  .  ketoconazole (NIZORAL) 2 % cream, Apply 1 application topically daily., Disp: , Rfl:  .  levocetirizine (XYZAL) 5 MG tablet, Take 1 tablet (5 mg total) by mouth every evening., Disp: 90 tablet, Rfl: 1 .  levothyroxine (SYNTHROID) 100 MCG tablet, Take 1 tablet (100 mcg total) by mouth daily., Disp: 90 tablet, Rfl: 2 .  meloxicam (MOBIC) 7.5 MG tablet, Take 7.5 mg by mouth in the morning and at bedtime., Disp: , Rfl:  .  nitroGLYCERIN (NITROSTAT) 0.4 MG SL tablet, Place 1 tablet (0.4 mg total) under the tongue every 5 (five) minutes as needed for chest pain., Disp: 20 tablet, Rfl: 2 .  pantoprazole (PROTONIX) 40 MG tablet, Take 1 tablet (40 mg total) by mouth 2 (two) times daily., Disp: 180 tablet, Rfl: 3 .  PARoxetine (PAXIL) 20 MG tablet, Take 1 tablet (20 mg total) by mouth daily., Disp: 90 tablet, Rfl: 1 .  hydrochlorothiazide (HYDRODIURIL) 50 MG tablet, Take 1 tablet (50 mg total) by mouth daily., Disp: 90 tablet, Rfl: 1 Social History   Socioeconomic History  . Marital status: Married    Spouse name: Not on file  . Number of children: 3  . Years of education: Not on file  . Highest education level: Not on file  Occupational History  . Occupation: [employer] for [employer]  Tobacco Use  . Smoking status: Former Smoker    Packs/day: 1.00    Years: 20.00    Pack years: 20.00    Types: Cigarettes    Quit date: 11/28/2006    Years since quitting: 13.4  . Smokeless tobacco: Never Used  Vaping Use  . Vaping Use: Never used  Substance and Sexual Activity  . Alcohol use: Yes    Alcohol/week: 0.0 standard drinks    Comment: rarely  . Drug use: No  . Sexual activity: Yes  Other Topics Concern  . Not on file  Social History Narrative   Lives at home with wife, new 37 year old adopted boy.     Social Determinants of Health   Financial Resource  Strain:   . Difficulty of Paying Living Expenses: Not on file  Food Insecurity:   . Worried About Charity fundraiser in the Last Year: Not on file  . Ran Out of Food in the Last Year: Not on file  Transportation Needs:   . Lack of Transportation (Medical): Not on file  . Lack of Transportation (Non-Medical): Not on file  Physical Activity:   . Days of Exercise per Week: Not on file  .  Minutes of Exercise per Session: Not on file  Stress:   . Feeling of Stress : Not on file  Social Connections:   . Frequency of Communication with Friends and Family: Not on file  . Frequency of Social Gatherings with Friends and Family: Not on file  . Attends Religious Services: Not on file  . Active Member of Clubs or Organizations: Not on file  . Attends Archivist Meetings: Not on file  . Marital Status: Not on file  Intimate Partner Violence:   . Fear of Current or Ex-Partner: Not on file  . Emotionally Abused: Not on file  . Physically Abused: Not on file  . Sexually Abused: Not on file   Family History  Problem Relation Age of Onset  . Heart failure Mother 54       Died age 82  . Congestive Heart Failure Mother   . Heart attack Father 76       Died with MI  . Heart attack Paternal Grandmother   . Heart attack Paternal Grandfather   . Colon cancer Neg Hx     Objective: Office vital signs reviewed. BP 132/79   Pulse 62   Temp 98.9 F (37.2 C)   Ht 5\' 10"  (1.778 m)   Wt 202 lb 3.2 oz (91.7 kg)   SpO2 99%   BMI 29.01 kg/m   Physical Examination:  General: Awake, alert, nontoxic, No acute distress HEENT: Normal, sclera white Cardio: regular rate and rhythm, S1S2 heard, no murmurs appreciated Pulm: clear to auscultation bilaterally, no wheezes, rhonchi or rales; normal work of breathing on room air.  No bowel sounds were appreciated within the chest cavity GI: soft, non-tender (specifically no epigastric tenderness palpation), non-distended, no hepatomegaly, no  splenomegaly.  Palpation of the abdomen did induce nausea.  Assessment/ Plan: 58 y.o. male   1. Gastroesophageal reflux disease with esophagitis without hemorrhage Concern for advanced hiatal hernia.  Referral back to his gastroenterologist has been placed.  Interim I have given him Zofran.  We discussed red flag signs and symptoms.  Eat small meals. - Ambulatory referral to Gastroenterology - ondansetron (ZOFRAN ODT) 4 MG disintegrating tablet; Take 1 tablet (4 mg total) by mouth every 8 (eight) hours as needed for nausea or vomiting.  Dispense: 20 tablet; Refill: 0  2. Left upper quadrant abdominal pain - Ambulatory referral to Gastroenterology - ondansetron (ZOFRAN ODT) 4 MG disintegrating tablet; Take 1 tablet (4 mg total) by mouth every 8 (eight) hours as needed for nausea or vomiting.  Dispense: 20 tablet; Refill: 0  3. Need for Tdap vaccination Administered - Tdap vaccine greater than or equal to 7yo IM   No orders of the defined types were placed in this encounter.  No orders of the defined types were placed in this encounter.    Janora Norlander, DO [place] [phone]

## 2020-05-17 ENCOUNTER — Encounter: Payer: Self-pay | Admitting: Nurse Practitioner

## 2020-05-27 ENCOUNTER — Telehealth: Payer: Self-pay

## 2020-05-27 NOTE — Telephone Encounter (Signed)
He may take 2 zofran at a time for nausea.  If still no help, let me know

## 2020-05-27 NOTE — Telephone Encounter (Signed)
Pt called stating that he was recently prescribed Zofran for nausea but says the medicine is not helping. Requesting that something else be called in for him.  Says he was referred to see [facility] specialist but says they cant see him until 06/09/20.

## 2020-05-27 NOTE — Telephone Encounter (Signed)
Patient aware and verbalizes understanding. 

## 2020-05-30 ENCOUNTER — Other Ambulatory Visit: Payer: Self-pay

## 2020-05-30 DIAGNOSIS — R1012 Left upper quadrant pain: Secondary | ICD-10-CM

## 2020-05-30 DIAGNOSIS — K2100 Gastro-esophageal reflux disease with esophagitis, without bleeding: Secondary | ICD-10-CM

## 2020-05-30 MED ORDER — ONDANSETRON 4 MG PO TBDP: 4.0000 mg | ORAL_TABLET | Freq: Three times a day (TID) | ORAL | 1 refills | Status: DC | PRN

## 2020-05-30 NOTE — Telephone Encounter (Signed)
  Prescription Request  05/30/2020  What is the name of the medication or equipment? Needs zofran called in. He said it helps but doesn't take away. If you can call something else in that would be great  Have you contacted your pharmacy to request a refill? (if applicable) no  Which [facility]   Patient notified that their request is being sent to the clinical staff for review and that they should receive a response within 2 business days.

## 2020-05-30 NOTE — Telephone Encounter (Signed)
Please advise on possible change from zofran med, since it helps but does not take away symptoms

## 2020-05-30 NOTE — Telephone Encounter (Signed)
Nothing will likely totally resolve his symptoms as there is suspicion for hiatal hernia causing symptoms in this patient.  He has an appointment with gastroenterology, who hopefully can provide insight and further treatment for him.

## 2020-05-30 NOTE — Telephone Encounter (Signed)
Patient aware, reports his appointment is with one of PA's at [facility] on 06/09/2020.  He will take the last Zofran today and would like to know if you will send in a refill to [facility] to get him through until he sees them.

## 2020-06-09 ENCOUNTER — Encounter: Payer: Self-pay | Admitting: Nurse Practitioner

## 2020-06-09 ENCOUNTER — Ambulatory Visit (INDEPENDENT_AMBULATORY_CARE_PROVIDER_SITE_OTHER): Payer: Medicare Other | Admitting: Nurse Practitioner

## 2020-06-09 ENCOUNTER — Other Ambulatory Visit (INDEPENDENT_AMBULATORY_CARE_PROVIDER_SITE_OTHER): Payer: Medicare Other

## 2020-06-09 VITALS — BP 119/72 | HR 60 | Ht 71.0 in | Wt 201.8 lb

## 2020-06-09 DIAGNOSIS — R1012 Left upper quadrant pain: Secondary | ICD-10-CM

## 2020-06-09 DIAGNOSIS — R1032 Left lower quadrant pain: Secondary | ICD-10-CM | POA: Diagnosis not present

## 2020-06-09 LAB — COMPREHENSIVE METABOLIC PANEL
ALT: 23 U/L (ref 0–53)
AST: 20 U/L (ref 0–37)
Albumin: 4.5 g/dL (ref 3.5–5.2)
Alkaline Phosphatase: 120 U/L — ABNORMAL HIGH (ref 39–117)
BUN: 14 mg/dL (ref 6–23)
CO2: 31 mEq/L (ref 19–32)
Calcium: 9.4 mg/dL (ref 8.4–10.5)
Chloride: 99 mEq/L (ref 96–112)
Creatinine, Ser: 1.09 mg/dL (ref 0.40–1.50)
GFR: 74.51 mL/min (ref >60.00)
Glucose, Bld: 95 mg/dL (ref 70–99)
Potassium: 3.3 mEq/L — ABNORMAL LOW (ref 3.5–5.1)
Sodium: 139 mEq/L (ref 135–145)
Total Bilirubin: 1.1 mg/dL (ref 0.2–1.2)
Total Protein: 6.9 g/dL (ref 6.0–8.3)

## 2020-06-09 LAB — CBC WITH DIFFERENTIAL/PLATELET
Basophils Absolute: 0.1 10*3/uL (ref 0.0–0.1)
Basophils Relative: 1 % (ref 0.0–3.0)
Eosinophils Absolute: 0.2 10*3/uL (ref 0.0–0.7)
Eosinophils Relative: 3.8 % (ref 0.0–5.0)
HCT: 38.7 % — ABNORMAL LOW (ref 39.0–52.0)
Hemoglobin: 12.9 g/dL — ABNORMAL LOW (ref 13.0–17.0)
Lymphocytes Relative: 19.9 % (ref 12.0–46.0)
Lymphs Abs: 1.3 10*3/uL (ref 0.7–4.0)
MCHC: 33.4 g/dL (ref 30.0–36.0)
MCV: 81.1 fl (ref 78.0–100.0)
Monocytes Absolute: 0.7 10*3/uL (ref 0.1–1.0)
Monocytes Relative: 10.1 % (ref 3.0–12.0)
Neutro Abs: 4.2 10*3/uL (ref 1.4–7.7)
Neutrophils Relative %: 65.2 % (ref 43.0–77.0)
Platelets: 235 10*3/uL (ref 150.0–400.0)
RBC: 4.77 Mil/uL (ref 4.22–5.81)
RDW: 15.7 % — ABNORMAL HIGH (ref 11.5–15.5)
WBC: 6.5 10*3/uL (ref 4.0–10.5)

## 2020-06-09 LAB — HIGH SENSITIVITY CRP: CRP, High Sensitivity: 0.86 mg/L (ref 0.000–5.000)

## 2020-06-09 LAB — LIPASE: Lipase: 38 U/L (ref 11.0–59.0)

## 2020-06-09 NOTE — Progress Notes (Signed)
06/09/2020 BOBBYJOE PABST [id number] [birth year]   CHIEF COMPLAINT: LUQ pain   HISTORY OF PRESENT ILLNESS: Jeffrey Hooper is a 59 year old male with a past medical history of arthritis, anxiety, hypertension, coronary artery disease s/p LAD stent 11/2015, alpha gal, kidney stones, hypothyroidism, pneumonia H1N1 and GERD. Past surgical history includes s/p  L5-S1 fusion 10/27/2019, cervical fusion, tonsillectomy and vasectomy.  See further surgical history below.  He was referred to our office by Dr. Lajuana Ripple for further evaluation regarding left upper quadrant abdominal pain x 2 to 3 months.  Approximately 2 months ago, he awakened while he was sleeping in his recliner with a severe left upper quadrant pain.  He sleeps in a recliner due to lower back pain.  He complains of having a knot type pain to his left upper quadrant.  He feels pressure in his stomach when he eats.  Sitting in different positions sometimes worsens this pain.  He also notices worse LUQ pain when he takes a deep breath in.  No cough, shortness of breath or hemoptysis.  He reports having nausea for the past several weeks with a sour taste in his mouth.  He denies having classic heartburn.  No dysphagia.  He is on aspirin 81 mg daily and Meloxicam 7.5 mg once daily.  No lower abdominal pain.  He is passing a normal formed brown bowel movement once daily.  No rectal bleeding or melena.  He underwent an EGD 01/12/2015 which showed gastritis without evidence of H. pylori, findings were consistent with PPI use.  No history of PUD. He remains on Pantoprazole 40 mg p.o. twice daily.  His most recent colonoscopy was 11/08/2015 which was normal.  No chest pain, palpitations or dizziness.  History of heart disease with one DES followed by cardiologist Dr. [name].  He was last seen by Dr. [name] 10/21/2019.  At that time, his cardiac status was stable and he was advised to follow-up in 12 months.  He is intermittent edema to his ankles for  which he takes Hydrochlorothiazide 50 mg daily.   EGD 01/12/2015 by Dr. Ardis Hughs: There was mild to moderate pan-gastritis, non-specific. This was biopsied distally and sent to pathology. The examination was otherwise normal FINDINGS CONSISTENT WITH PROTON PUMP INHIBITOR THERAPY. - THERE IS NO EVIDENCE OF SIGNIFICANT INFLAMMATION, DYSPLASIA, OR MALIGNANCY.  Colonoscopy 11/08/2015 by Dr. Ardis Hughs: - The examined portion of the ileum was normal. - The entire examined colon is normal.  Biopsies were negative for microscopic colitis. - The examination was otherwise normal on direct and retroflexion views. - 10 year recall  Echo 11/25/2015: LVEF 60 to 65%  Past Medical History:  Diagnosis Date  . Allergy   . Anxiety   . Arthritis   . CAD (coronary artery disease), native coronary artery 12/03/2015  . Coronary artery disease   . Dysrhythmia    "irregular and PAC'S"  . GERD (gastroesophageal reflux disease)   . Head injury with loss of consciousness ([place])    back in the 1980's  . History of kidney stones   . Hyperlipidemia   . Hypertension   . Hypothyroid   . Multiple benign nevi   . Neuromuscular disorder ([place]) 2011   LEFT RADIAL NERVE SURGERY R/T TRAUMA  . Pneumonia 2014, 2017  . Prostatism   . Pulmonary nodule    Past Surgical History:  Procedure Laterality Date  . BACK SURGERY     T12 - L1  . CARDIAC CATHETERIZATION N/A 12/02/2015  Procedure: Left Heart Cath and Coronary Angiography;  Surgeon: Peter M Martinique, MD;  Location: [facility];  Service: Cardiovascular;  Laterality: N/A;  . CARDIAC CATHETERIZATION N/A 12/02/2015   Procedure: Coronary Stent Intervention;  Surgeon: Peter M Martinique, MD;  Location: [facility];  Service: Cardiovascular;  Laterality: N/A;  mid LAD Promus 2.5x12  . CORONARY ANGIOPLASTY     one stent placed by Dr. P Martinique  . HARDWARE REMOVAL Right 08/26/2017   Procedure: Right Lumbar Five Revision of pedicle screw with Removal of Lumbar Five  Screw;  Surgeon: Kristeen Miss, MD;  Location: [facility];  Service: Neurosurgery;  Laterality: Right;  posterior  . HARDWARE REMOVAL Right 12/27/2017   Procedure: Right Lumbar Two, Lumbar Three, Lumbar Four Pedicle screw removal with metrex;  Surgeon: Kristeen Miss, MD;  Location: [facility];  Service: Neurosurgery;  Laterality: Right;  Right L2 to L4 Pedicle screw removal with mterex  . MASS EXCISION  06/25/2012   Procedure: EXCISION MASS;  Surgeon: Wynonia Sours, MD;  Location: [facility];  Service: Orthopedics;  Laterality: Left;  transection of NEUROMA, BURYING RADIAL NERVE IN BRACHIORADIALIS LEFT SIDE  . radial nerve Left    cut in work injury  . RIGHT GREAT TOENAIL REMOVAL  04/2020  . SPINAL FUSION     C6-7  . TONSILLECTOMY  1982  . VASECTOMY     Social History: He is married. Retired. He has 3 sons. He smoked 1ppd x 30 years quit smoking 15 years. Rare alcohol intake.  No drug use.   Family History: Mother deceased age 43 with CHF. Father deceased age 61 with MI.  2 brothers and 2 sisters, sister with diverticulitis.    Allergies  Allergen Reactions  . Alpha-Gal Rash and Other (See Comments)    SEVERE ALLERGY TO ANY MEAT OR MEAT DERIVED PRODUCTS FROM 4 LEGGED ANIMALS > > BEEF, PORK , GOATS, DEER, ETC. < < RESULT OF BITE FROM LONE STAR TICK  . Beef (Bovine) Protein Rash and Other (See Comments)  . Beef-Derived Products Rash and Other (See Comments)  . Lambs Quarters Rash and Other (See Comments)  . Pork-Derived Products Rash and Other (See Comments)  . Prednisone Other (See Comments)    im depo medrol caused dizziness - pt passed out. Ask pt before giving. Pt can take oral prednisone   . Brilinta [Ticagrelor] Hives    Pt ate pork on the same day he took Brilinta before knowing he had alpha gal. May have been the alpha gal reaction, pt is unsure.   . Doxycycline Hives  . Gabapentin Swelling  . Lyrica [Pregabalin]     Dry mouth and felt raw  . Methylprednisolone Sodium  Succ     im depo medrol caused dizziness - pt passed out. Ask pt before giving.  [name] Penicillins Hives    Did it involve swelling of the face/tongue/throat, SOB, or low BP? No Did it involve sudden or severe rash/hives, skin peeling, or any reaction on the inside of your mouth or nose? No Did you need to seek medical attention at a hospital or doctor's office? No When did it last happen?10 + years ago If all above answers are "NO", may proceed with cephalosporin use.   . Shellfish Allergy Hives  . Adhesive [Tape] Rash  . Fentanyl Rash    Reaction to adhesive, not the drug       Outpatient Encounter Medications as of 06/09/2020  Medication Sig  .  acetaminophen (TYLENOL) 500 MG tablet Take 1,000 mg by mouth daily as needed for mild pain or headache.   [name] amLODipine (NORVASC) 5 MG tablet TAKE 1.5 TABLETS (7.5 MG TOTAL) BY MOUTH DAILY.  [name] aspirin EC 81 MG tablet Take 81 mg by mouth at bedtime.  [name] atorvastatin (LIPITOR) 80 MG tablet Take 1 tablet (80 mg total) by mouth at bedtime.  . betamethasone dipropionate (DIPROLENE) 0.05 % cream APPLY TO AFFECTED AREA TWICE A DAY (Patient taking differently: Apply 1 application topically daily as needed (hemorrhoids). )  . diphenhydrAMINE (BENADRYL) 25 MG tablet Take 25 mg by mouth 2 (two) times daily as needed for itching.   [name] EPINEPHRINE 0.3 mg/0.3 mL IJ SOAJ injection INJECT 0.3 MLS (0.3 MG TOTAL) INTO THE MUSCLE AS NEEDED FOR ANAPHYLAXIS.  . fluticasone (FLONASE) 50 MCG/ACT nasal spray SPRAY 2 SPRAYS INTO EACH NOSTRIL EVERY DAY (Patient taking differently: Place 2 sprays into both nostrils daily. )  . HYDROcodone-acetaminophen (NORCO/VICODIN) 5-325 MG tablet Take 1 tablet by mouth every 6 (six) hours as needed. for pain  . ketoconazole (NIZORAL) 2 % cream Apply 1 application topically daily.  [name] levocetirizine (XYZAL) 5 MG tablet Take 1 tablet (5 mg total) by mouth every evening.  [name] levothyroxine (SYNTHROID) 100 MCG tablet Take 1 tablet (100 mcg  total) by mouth daily.  . meloxicam (MOBIC) 7.5 MG tablet Take 7.5 mg by mouth in the morning and at bedtime.  . nitroGLYCERIN (NITROSTAT) 0.4 MG SL tablet Place 1 tablet (0.4 mg total) under the tongue every 5 (five) minutes as needed for chest pain.  [name] ondansetron (ZOFRAN ODT) 4 MG disintegrating tablet Take 1 tablet (4 mg total) by mouth every 8 (eight) hours as needed for nausea or vomiting. (Patient taking differently: Take 4 mg by mouth every 8 (eight) hours as needed for nausea or vomiting. Takes 2 tablets Q8H prn)  . pantoprazole (PROTONIX) 40 MG tablet Take 1 tablet (40 mg total) by mouth 2 (two) times daily.  [name] PARoxetine (PAXIL) 20 MG tablet Take 1 tablet (20 mg total) by mouth daily.  . hydrochlorothiazide (HYDRODIURIL) 50 MG tablet Take 1 tablet (50 mg total) by mouth daily.   No facility-administered encounter medications on file as of 06/09/2020.     REVIEW OF SYSTEMS: Gen: Denies fever, sweats or chills. No weight loss.  CV: Denies chest pain, palpitations or edema. See HPI.  Resp: Denies cough, shortness of breath of hemoptysis.  GI: See HPI.  GU : Denies urinary burning, blood in urine, increased urinary frequency or incontinence. MS: Chronic back and leg pain.  Derm: Denies rash, itchiness, skin lesions or unhealing ulcers. Psych: Denies depression or anxiety.  Heme: Denies bruising, bleeding. Neuro:  Denies headaches, dizziness or paresthesias. Endo:  Denies any problems with DM, thyroid or adrenal function.  PHYSICAL EXAM: BP 119/72   Pulse 60   Ht 5\' 11"  (1.803 m)   Wt 201 lb 12.8 oz (91.5 kg)   SpO2 99%   BMI 28.15 kg/m  General: Well developed 59 year old male in no acute distress. He ambulates with the assistance of a cane.  Head: Normocephalic and atraumatic. Eyes:  Sclerae non-icteric, conjunctive pink. Ears: Normal auditory acuity. Mouth: Dentition intact. Temporary crown in place. No ulcers or lesions.  Neck: Supple, no lymphadenopathy or thyromegaly.   Lungs: Clear bilaterally to auscultation without wheezes, crackles or rhonchi. Heart: Regular rate and rhythm. No murmur, rub or gallop appreciated.  Abdomen: Soft, non distended. Moderate LUQ tenderness,  periumbilical and LLQ tenderness without rebound or guarding. No CVA tenderness. No masses. No hepatosplenomegaly. Normoactive bowel sounds x 4 quadrants.  Rectal: Deferred.  Musculoskeletal: Symmetrical with no gross deformities. Skin: Warm and dry. No rash or lesions on visible extremities. Extremities: No edema. Neurological: Alert oriented x 4, no focal deficits.  Psychological:  Alert and cooperative. Normal mood and affect.  ASSESSMENT AND PLAN:  13.  59 year old male with a history of GERD on PPI bid presents with nausea and left upper quadrant abdominal pain which worsens after eating, change in position and with deep inspiration.  Moderate left upper quadrant, periumbilical and left lower quadrant tenderness on exam without rebound or guarding. + NSAID use.  -CBC, CMP and lipase level -CTAP with oral and IV contrast, BU/Cr levels to be reviewed prior to receiving IV contrast -Band diet  -Continue Pantoprazole 40 mg twice daily. Add Gaviscon 1 tablespoon 3 times daily before meals. -Schedule EGD after the above CT and lab results reviewed. EGD benefits and risks discussed including risk with sedation, risk of bleeding, perforation and infection  -Patient to call our office if his symptoms worsen -Patient to go to the ED if severe abdominal pain occurs  2.  History of CAD s/p DES 2017 on ASA   3. Colon cancer screening -Next colonoscopy due 11/2025, earlier if symptoms warrant        CC:  Janora Norlander, DO

## 2020-06-09 NOTE — Patient Instructions (Addendum)
Your provider has requested that you go to the basement level for lab work before leaving today. Press "B" on the elevator. The lab is located at the first door on the left as you exit the elevator.    You have been scheduled for a CT scan of the abdomen and pelvis at [facility], 1st floor Radiology. You are scheduled on 06/21/2020  at 8:30am. You should arrive 15 minutes prior to your appointment time for registration.   The solution may taste better if refrigerated, but do NOT add ice or any other liquid to this solution. Shake well before drinking.   Please follow the written instructions below on the day of your exam:   1) Do not eat anything after 4:30am (4 hours prior to your test)   2) Drink 1 bottle of contrast @ 6:30am (2 hours prior to your exam)  Remember to shake well before drinking and do NOT pour over ice.     Drink 1 bottle of contrast @ 7:30am (1 hour prior to your exam)   You may take any medications as prescribed with a small amount of water, if necessary. If you take any of the following medications: METFORMIN, GLUCOPHAGE, GLUCOVANCE, AVANDAMET, RIOMET, FORTAMET, [place] MET, JANUMET, GLUMETZA or METAGLIP, you MAY be asked to HOLD this medication 48 hours AFTER the exam.   The purpose of you drinking the oral contrast is to aid in the visualization of your intestinal tract. The contrast solution may cause some diarrhea. Depending on your individual set of symptoms, you may also receive an intravenous injection of x-ray contrast/dye. Plan on being at [facility] for 45 minutes or longer, depending on the type of exam you are having performed.   If you have any questions regarding your exam or if you need to reschedule, you may call [facility] at [phone] between the hours of 8:00 am and 5:00 pm, Monday-Friday.   Add Gaviscon - 1 tablespoon three times a day 30  Minutes before meals.  We will discuss scheduling an EGD if the CT is negative.  Call our  office if symptoms worsen

## 2020-06-10 DIAGNOSIS — M5416 Radiculopathy, lumbar region: Secondary | ICD-10-CM | POA: Diagnosis not present

## 2020-06-10 NOTE — Progress Notes (Signed)
I agree with the above note, plan.  Imaging and lab tests first to be possibly followed with further testing if needed.

## 2020-06-14 ENCOUNTER — Other Ambulatory Visit: Payer: Self-pay | Admitting: Family Medicine

## 2020-06-14 ENCOUNTER — Other Ambulatory Visit: Payer: Self-pay | Admitting: *Deleted

## 2020-06-14 DIAGNOSIS — Z9861 Coronary angioplasty status: Secondary | ICD-10-CM

## 2020-06-14 DIAGNOSIS — R748 Abnormal levels of other serum enzymes: Secondary | ICD-10-CM

## 2020-06-14 DIAGNOSIS — I10 Essential (primary) hypertension: Secondary | ICD-10-CM

## 2020-06-14 DIAGNOSIS — J3089 Other allergic rhinitis: Secondary | ICD-10-CM

## 2020-06-14 DIAGNOSIS — L03031 Cellulitis of right toe: Secondary | ICD-10-CM | POA: Diagnosis not present

## 2020-06-14 DIAGNOSIS — I2510 Atherosclerotic heart disease of native coronary artery without angina pectoris: Secondary | ICD-10-CM

## 2020-06-14 MED ORDER — FLUTICASONE PROPIONATE 50 MCG/ACT NA SUSP: 2.0000 | Freq: Every day | NASAL | 1 refills | Status: DC

## 2020-06-15 ENCOUNTER — Other Ambulatory Visit: Payer: Medicare Other

## 2020-06-15 ENCOUNTER — Other Ambulatory Visit: Payer: Self-pay

## 2020-06-15 DIAGNOSIS — R748 Abnormal levels of other serum enzymes: Secondary | ICD-10-CM

## 2020-06-15 DIAGNOSIS — I10 Essential (primary) hypertension: Secondary | ICD-10-CM

## 2020-06-16 LAB — BMP8+EGFR
BUN/Creatinine Ratio: 15 (ref 9–20)
BUN: 16 mg/dL (ref 6–24)
CO2: 28 mmol/L (ref 20–29)
Calcium: 9.3 mg/dL (ref 8.7–10.2)
Chloride: 102 mmol/L (ref 96–106)
Creatinine, Ser: 1.08 mg/dL (ref 0.76–1.27)
GFR calc Af Amer: 86 mL/min/{1.73_m2} (ref >59)
GFR calc non Af Amer: 75 mL/min/{1.73_m2} (ref >59)
Glucose: 115 mg/dL — ABNORMAL HIGH (ref 65–99)
Potassium: 3.8 mmol/L (ref 3.5–5.2)
Sodium: 144 mmol/L (ref 134–144)

## 2020-06-16 LAB — MAGNESIUM: Magnesium: 2.2 mg/dL (ref 1.6–2.3)

## 2020-06-21 ENCOUNTER — Telehealth: Payer: Self-pay | Admitting: Nurse Practitioner

## 2020-06-21 ENCOUNTER — Other Ambulatory Visit: Payer: Self-pay

## 2020-06-21 ENCOUNTER — Ambulatory Visit (HOSPITAL_COMMUNITY)
Admission: RE | Admit: 2020-06-21 | Discharge: 2020-06-21 | Disposition: A | Discharge status: Home / Self Care | Payer: Medicare Other | Source: Ambulatory Visit | Attending: Nurse Practitioner | Admitting: Nurse Practitioner

## 2020-06-21 ENCOUNTER — Encounter (HOSPITAL_COMMUNITY): Payer: Self-pay

## 2020-06-21 DIAGNOSIS — K561 Intussusception: Secondary | ICD-10-CM | POA: Diagnosis not present

## 2020-06-21 DIAGNOSIS — K429 Umbilical hernia without obstruction or gangrene: Secondary | ICD-10-CM | POA: Diagnosis not present

## 2020-06-21 DIAGNOSIS — R1012 Left upper quadrant pain: Secondary | ICD-10-CM | POA: Diagnosis not present

## 2020-06-21 DIAGNOSIS — N3289 Other specified disorders of bladder: Secondary | ICD-10-CM | POA: Diagnosis not present

## 2020-06-21 DIAGNOSIS — R1032 Left lower quadrant pain: Secondary | ICD-10-CM | POA: Diagnosis not present

## 2020-06-21 DIAGNOSIS — I700 Atherosclerosis of aorta: Secondary | ICD-10-CM | POA: Diagnosis not present

## 2020-06-21 MED ORDER — IOHEXOL 300 MG/ML  SOLN
100.0000 mL | Freq: Once | INTRAMUSCULAR | Status: AC | PRN
  Administered 2020-06-21: 100 mL via INTRAVENOUS

## 2020-06-21 NOTE — Telephone Encounter (Signed)
Spoke with the patient. He had not read the note sent to him with the recommendations or checked his voicemail on the phone. He only knew he ahd missed a call from someone. I read the result note that had been sent to him on the imaging report of the CTAP. He is very Patent attorney and willing to go forward with the referral. Records faxed to [facility] and marked as "Urgent."  Discussed stool softeners. Patient was satisfied with the information given to him and thanked me for the call. He expresses thanks for the help from [facility] in getting to the cause of his pain.

## 2020-06-21 NOTE — Telephone Encounter (Signed)
Patient calling in reference to CT results

## 2020-06-21 NOTE — Telephone Encounter (Signed)
Spoke with the patient. He has seen his CT results. I gave a very brief explanation of intussusception. Advised no residue diet. He understands the results are under review by his providers and he will be given recommendations soon.

## 2020-06-24 ENCOUNTER — Telehealth: Payer: Self-pay

## 2020-06-24 ENCOUNTER — Other Ambulatory Visit: Payer: Self-pay | Admitting: Family Medicine

## 2020-06-24 ENCOUNTER — Other Ambulatory Visit: Payer: Self-pay | Admitting: Nurse Practitioner

## 2020-06-24 DIAGNOSIS — I10 Essential (primary) hypertension: Secondary | ICD-10-CM

## 2020-06-24 DIAGNOSIS — R058 Other specified cough: Secondary | ICD-10-CM

## 2020-06-24 DIAGNOSIS — Z20822 Contact with and (suspected) exposure to covid-19: Secondary | ICD-10-CM

## 2020-06-24 MED ORDER — PROMETHAZINE HCL 25 MG PO TABS: 25.0000 mg | ORAL_TABLET | Freq: Three times a day (TID) | ORAL | 0 refills | Status: DC | PRN

## 2020-06-24 NOTE — Telephone Encounter (Signed)
Pt is requesting prescription for Zofran.He states that he is still dealing with nausea. [facility] [place].

## 2020-06-24 NOTE — Telephone Encounter (Signed)
Zofran originally prescribed by pcp;  Please advise

## 2020-06-24 NOTE — Telephone Encounter (Signed)
Pt called stating that the Zofran he is currently taking for nausea is not helping at all. Had a CT scan done on 06/21/20 and was recommended to ask Dr Lajuana Ripple if she could call in a Rx for him called Phenergan to help with nausea. [facility] [place]  Pt scheduled to have consult with surgeon on 07/04/20.  Please advise.

## 2020-06-24 NOTE — Telephone Encounter (Signed)
No problem, please discontinue Zofran and start promethazine instead.  Caution sedation, dryness.  Continue to follow-up with GI for ongoing GI needs

## 2020-06-25 ENCOUNTER — Other Ambulatory Visit: Payer: Self-pay | Admitting: Nurse Practitioner

## 2020-06-25 DIAGNOSIS — K2100 Gastro-esophageal reflux disease with esophagitis, without bleeding: Secondary | ICD-10-CM

## 2020-06-25 DIAGNOSIS — R1012 Left upper quadrant pain: Secondary | ICD-10-CM

## 2020-06-25 MED ORDER — ONDANSETRON 4 MG PO TBDP: 4.0000 mg | ORAL_TABLET | Freq: Three times a day (TID) | ORAL | 0 refills | Status: DC | PRN

## 2020-06-25 NOTE — Telephone Encounter (Signed)
Beth, pls contact patient Monday during office hours, let him know I refilled his Zofran RX. He needs to call our office if his nausea worsens. To ER if he has severe vomiting/abdominal pain. Proceed with general surgery consult as previously recommended. Thx

## 2020-06-27 ENCOUNTER — Other Ambulatory Visit: Payer: Self-pay | Admitting: *Deleted

## 2020-06-27 DIAGNOSIS — E782 Mixed hyperlipidemia: Secondary | ICD-10-CM

## 2020-06-27 MED ORDER — ATORVASTATIN CALCIUM 80 MG PO TABS: 80.0000 mg | ORAL_TABLET | Freq: Every day | ORAL | 0 refills | Status: DC

## 2020-06-27 NOTE — Telephone Encounter (Signed)
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  Can you contact [facility] to see if he can be seen sooner.  If pain becomes severe, unrelenting he needs to go to the ER.

## 2020-06-27 NOTE — Telephone Encounter (Signed)
Dr. Ardis Hughs, I just wanted to keep you in the loop. Pls review patient message below.  Refer to his last office visit and CTAP results which showed possible jejunal intussusception. Let me know if he should have EGD asap as he waits to see general surgery. THX

## 2020-06-27 NOTE — Telephone Encounter (Signed)
Spoke with this very nice patient. He tells me the Zofran was picked up. It did not help as much as he needed. He called his GP and was given Phenergan which has been more effective. Continues to try to eat soft foods and small frequent meals. He is trying to maintain hydration but finds both this a challenge. The discomfort comes in waves. He is not better, and states he is also not worse. "When I think I need to go to the ED, it starts dying down a little" referring to his abdominal pain.His appointment with [facility] is 07/14/20. He is calling asking if there are any cancellations in hopes of getting in sooner. We also discussed "not being a hero" and going to the ED if his pain or nausea do become worse.

## 2020-06-27 NOTE — Telephone Encounter (Signed)
Patient is on the wait list. He has already called them today. I have as well. No earlier openings. He understands (by his own statement) to go to the ER if the pain does not subside or becomes severe.

## 2020-06-28 NOTE — Telephone Encounter (Signed)
Beth, thank you for contacting [facility], await their recommendations. Agree with patient going to ER if sx worsen.

## 2020-06-29 ENCOUNTER — Other Ambulatory Visit: Payer: Self-pay | Admitting: *Deleted

## 2020-06-29 DIAGNOSIS — Z20822 Contact with and (suspected) exposure to covid-19: Secondary | ICD-10-CM

## 2020-06-29 DIAGNOSIS — R058 Other specified cough: Secondary | ICD-10-CM

## 2020-07-04 ENCOUNTER — Ambulatory Visit: Payer: Self-pay | Admitting: General Surgery

## 2020-07-04 DIAGNOSIS — R1012 Left upper quadrant pain: Secondary | ICD-10-CM | POA: Diagnosis not present

## 2020-07-04 NOTE — H&P (Signed)
The patient is a 59 year old male who presents with abdominal pain. 59 year old male who presents to the office for evaluation of left upper quadrant abdominal pain and CT scan showing a possible intussusception in the same area. Patient denies any past surgical history within the abdomen. He was referred to GI for left upper quadrant pain for approximately 2-3 months. He reports nausea and decreased appetite. He also notices the pain when he takes a large deep breath in. He is also complaining of diarrhea but denies any melena or rectal bleeding.   Past Surgical History Darden Palmer, [place]; 07/04/2020 1:39 PM) Spinal Surgery - Lower Back Tonsillectomy Vasectomy  Diagnostic Studies History Darden Palmer, [place]; 07/04/2020 1:39 PM) Colonoscopy 5-10 years ago  Allergies Darden Palmer, RMA; 07/04/2020 1:46 PM) predniSONE *CORTICOSTEROIDS* Brilinta *HEMATOLOGICAL AGENTS - MISC.* Hives. Doxycycline Hyclate *TETRACYCLINES* Gabapentin *CHEMICALS* Swelling. methylPREDNISolone *CORTICOSTEROIDS* Penicillin V Potassium *PENICILLINS* Hives. Adhesive Paper *MEDICAL DEVICES AND SUPPLIES* fentaNYL *ANALGESICS - OPIOID* Allergies Reconciled  Medication History Darden Palmer, RMA; 07/04/2020 1:46 PM) amLODIPine Besylate (5MG  Tablet, Oral) Active. Aspir-Low (81MG  Tablet DR, Oral) Active. Tylenol Extra Strength (500MG  Tablet, Oral) Active. Benadryl Allergy (25MG  Capsule, Oral) Active. Flonase (50MCG/ACT Suspension, Nasal) Active. EPINEPHrine (0.3MG /0.3ML Device, Injection) Active. hydroCHLOROthiazide (50MG  Tablet, Oral) Active. HYDROcodone-Acetaminophen (5-325MG  Tablet, Oral) Active. Ketoconazole (2% Cream, External) Active. Levocetirizine Dihydrochloride (5MG  Tablet, Oral) Active. Levothyroxine Sodium (100MCG Tablet, Oral) Active. Meloxicam (7.5MG  Tablet, Oral) Active. Nitroglycerin (0.4MG  Tab Sublingual, Sublingual) Active. Ondansetron (4MG  Tablet Disint, Oral)  Active. Pantoprazole Sodium (40MG  Tablet DR, Oral) Active. PARoxetine HCl (20MG  Tablet, Oral) Active. Promethazine HCl (25MG  Tablet, Oral) Active. Medications Reconciled  Social History Darden Palmer, [place]; 07/04/2020 1:39 PM) Alcohol use Occasional alcohol use. Caffeine use Carbonated beverages, Coffee. No drug use Tobacco use Former smoker.  Family History Darden Palmer, [place]; 07/04/2020 1:39 PM) Alcohol Abuse Brother. Arthritis Father, Mother. Heart Disease Father. Hypertension Brother, Father, Sister.  Other Problems Darden Palmer, [place]; 07/04/2020 1:39 PM) Back Pain Chest pain Gastroesophageal Reflux Disease Hemorrhoids High blood pressure Hypercholesterolemia Kidney Stone Thyroid Disease     Review of Systems Darden Palmer RMA; 07/04/2020 1:39 PM) General Present- Appetite Loss. Not Present- Chills, Fatigue, Fever, Night Sweats, Weight Gain and Weight Loss. Skin Present- Dryness. Not Present- Change in Wart/Mole, Hives, Jaundice, New Lesions, Non-Healing Wounds, Rash and Ulcer. HEENT Present- Hearing Loss, Ringing in the Ears, Seasonal Allergies and Wears glasses/contact lenses. Not Present- Earache, Hoarseness, Nose Bleed, Oral Ulcers, Sinus Pain, Sore Throat, Visual Disturbances and Yellow Eyes. Respiratory Present- Snoring. Not Present- Bloody sputum, Chronic Cough, Difficulty Breathing and Wheezing. Breast Not Present- Breast Mass, Breast Pain, Nipple Discharge and Skin Changes. Cardiovascular Present- Palpitations. Not Present- Chest Pain, Difficulty Breathing Lying Down, Leg Cramps, Rapid Heart Rate, Shortness of Breath and Swelling of Extremities. Gastrointestinal Present- Abdominal Pain, Bloating, Change in Bowel Habits, Excessive gas, Hemorrhoids and Nausea. Not Present- Bloody Stool, Chronic diarrhea, Constipation, Difficulty Swallowing, Gets full quickly at meals, Indigestion, Rectal Pain and Vomiting. Male Genitourinary Not Present- Blood  in Urine, Change in Urinary Stream, Frequency, Impotence, Nocturia, Painful Urination, Urgency and Urine Leakage. Musculoskeletal Present- Back Pain. Not Present- Joint Pain, Joint Stiffness, Muscle Pain, Muscle Weakness and Swelling of Extremities. Neurological Present- Headaches, Numbness, Tingling and Weakness. Not Present- Decreased Memory, Fainting, Seizures, Tremor and Trouble walking. Psychiatric Not Present- Anxiety, Bipolar, Change in Sleep Pattern, Depression, Fearful and Frequent crying. Endocrine Not Present- Cold Intolerance, Excessive Hunger, Hair Changes, Heat Intolerance, Hot flashes and New Diabetes. Hematology Not Present- Blood Thinners,  Easy Bruising, Excessive bleeding, Gland problems, HIV and Persistent Infections.  Vitals Lattie Haw Crystal RMA; 07/04/2020 1:47 PM) 07/04/2020 1:47 PM Weight: 200 lb Height: 70in Body Surface Area: 2.09 m Body Mass Index: 28.7 kg/m  Temp.: 98.57F  Pulse: 69 (Regular)  P.OX: 99% (Room air) BP: 122/78(Sitting, Left Arm, Standard)        Physical Exam Leighton Ruff MD; 80/10/4915 2:01 PM)  General Mental Status-Alert. General Appearance-Cooperative.  Abdomen Note: No masses palpated on physical exam. Small umbilical hernia. Tenderness to palpation in the left upper quadrant.    Assessment & Plan Leighton Ruff MD; 91/50/5697 1:59 PM)  LUQ ABDOMINAL PAIN (R10.12) Impression: 59 year old male with left upper quadrant pain and abnormal pathology seen on CT scan. We discussed the risk and benefits of proceeding with diagnostic laparoscopy and possible small bowel resection. He does have a small umbilical hernia that we can repair at the same time. He understands there is a risk that no pathology will be noted in his symptoms. Will continue after surgery. There is also a risk that we could remove the pathology and he continued to have his current symptoms. Other risks including bleeding, damage to adjacent structures,  and postoperative ileus were discussed as well. All questions were answered.

## 2020-07-05 ENCOUNTER — Telehealth: Payer: Self-pay

## 2020-07-05 NOTE — Telephone Encounter (Addendum)
   [facility] Pre-operative Risk Assessment    Dr. Minus Breeding  Request for surgical clearance:  1. What type of surgery is being performed? Diagnostic laparoscopy and possible small bowel resection   2. When is this surgery scheduled? TBD   3. What type of clearance is required (medical clearance vs. Pharmacy clearance to hold med vs. Both)? Medical  4. Are there any medications that need to be held prior to surgery and how long?Aspirin   5. Practice name and name of physician performing surgery? [facility], Dr. Leighton Ruff, MD   6. What is the office phone number? [phone]   7.   What is the office fax number? [phone] Attn: Mammie Lorenzo, LPN  8.   Anesthesia type (None, local, MAC, general) ? General   Jeffrey Hooper 07/05/2020, 5:13 PM  _________________________________________________________________   (provider comments below)

## 2020-07-06 ENCOUNTER — Telehealth: Payer: Self-pay

## 2020-07-06 NOTE — Telephone Encounter (Signed)
Pt agreeable to pre op appt. Pt has been scheduled to see Cecilie Kicks, NP 07/11/20 for pre op clearance at the [facility] office. Pt thanked me for the help. I will forward notes to NP for appt. Will send FYI to requesting office pt has appt 07/11/20. Will remove from the pre op call back pool.

## 2020-07-06 NOTE — Telephone Encounter (Signed)
Go ahead and send a refill of 20 of the Phenergan

## 2020-07-06 NOTE — Telephone Encounter (Signed)
  Prescription Request  07/06/2020  What is the name of the medication or equipment? promethazine (PHENERGAN) 25 MG tablet  Have you contacted your pharmacy to request a refill? (if applicable) no  Which pharmacy would you like this sent to? [facility]   Patient notified that their request is being sent to the clinical staff for review and that they should receive a response within 2 business days.

## 2020-07-06 NOTE — Telephone Encounter (Signed)
Patient was given a refill on 06/24/20 #20 R-0.  States he is out and requesting a refill.  Covering PCP-please advise

## 2020-07-06 NOTE — Telephone Encounter (Signed)
   Primary Cardiologist: Minus Breeding, MD  Chart reviewed as part of pre-operative protocol coverage. Because of Jeffrey Hooper's past medical history and time since last visit, he will require a follow-up visit in order to better assess preoperative cardiovascular risk.  Pre-op covering staff: - Please schedule appointment and call patient to inform them. If patient already had an upcoming appointment within acceptable timeframe, please add "pre-op clearance" to the appointment notes so provider is aware. - Please contact requesting surgeon's office via preferred method (i.e, phone, fax) to inform them of need for appointment prior to surgery.  If applicable, this message will also be routed to pharmacy pool and/or primary cardiologist for input on holding anticoagulant/antiplatelet agent as requested below so that this information is available to the clearing provider at time of patient's appointment.   Tami Lin Ashly Goethe, PA  07/06/2020, 11:43 AM

## 2020-07-07 ENCOUNTER — Other Ambulatory Visit: Payer: Self-pay | Admitting: Family Medicine

## 2020-07-07 ENCOUNTER — Other Ambulatory Visit: Payer: Self-pay | Admitting: *Deleted

## 2020-07-07 MED ORDER — PROMETHAZINE HCL 25 MG PO TABS
25.0000 mg | ORAL_TABLET | Freq: Three times a day (TID) | ORAL | 0 refills | Status: DC | PRN
Start: 2020-07-07 — End: 2020-07-20

## 2020-07-07 NOTE — Telephone Encounter (Signed)
Pt aware medication has been sent in.

## 2020-07-10 NOTE — Progress Notes (Unsigned)
Cardiology Office Note   Date:  07/11/2020   ID:  Jeffrey Hooper, DOB March 02, 1961, MRN [id number]  PCP:  Janora Norlander, DO  Cardiologist:  Dr. [name]    Chief Complaint  Patient presents with  . Pre-op Exam      History of Present Illness: Jeffrey Hooper is a 59 y.o. male who presents for Pre-op eval for diagnostic lap or possible small bowel resection. He has been having abd pain.      Last seen by Dr. [name] 10/21/19  CAD. Stress perfusion study in 2017suggested a moderate sized defect in the inferior wall with ischemia. The EF was well-preserved. The patient presented to [facility] on 12/[facility]/15 for cath and was found to have single vessel obstructive CAD involving the mid LAD. He underwent successful PCI with a mid LAD DES.   On last visit He had chest pain when I last saw him but he had a negative perfusion study.  He is scheduled for back surgery.  He is continuing to get some fleeting discomfort feeling like a nail pinching in his chest.  However, he is not having any substernal discomfort.  He might have some fleeting skipping beats.  However, 3-day monitor demonstrated very rare supraventricular ventricular beats with only one 4 beat run of SVT.  He is really not describing substernal discomfort, neck or arm discomfort.  He is not having any presyncope or syncope.  He has no new shortness of breath, PND or orthopnea.  He has had no weight gain or edema.  nuc study 09/17/19 low risk study ok for surgery at that time.   Today plans for diagnostic lap and possible sm. Bowel resection for Short-segment jejunal intussusception in the LEFT upper quadrant. This is approximately 3 cm a slightly greater than 3 cm total length and is not associated with obstruction, persisting on delayed phase images. While this may represent a transient phenomenon, length at slightly greater than expected for transient phenomenon and LEFT-sided symptoms may warrant further investigation.  He has a  hernia as well.    Pt has not had chest pain or SOB.  At times some lower ext edema but none today.  occ palpitations but no more than his usual.  He did well with his last surgery.  We discussed if need for incision rather than lap, may be higher risk. Neg nuc study 09/17/19 and no cardiac symptoms since that surgery.    Past Medical History:  Diagnosis Date  . Allergy   . Anxiety   . Arthritis   . CAD (coronary artery disease), native coronary artery 12/03/2015  . Coronary artery disease   . Dysrhythmia    "irregular and PAC'S"  . GERD (gastroesophageal reflux disease)   . Head injury with loss of consciousness ([place])    back in the 1980's  . History of kidney stones   . Hyperlipidemia   . Hypertension   . Hypothyroid   . Multiple benign nevi   . Neuromuscular disorder ([place]) 2011   LEFT RADIAL NERVE SURGERY R/T TRAUMA  . Pneumonia 2014, 2017  . Prostatism   . Pulmonary nodule     Past Surgical History:  Procedure Laterality Date  . BACK SURGERY     T12 - L1  . CARDIAC CATHETERIZATION N/A 12/02/2015   Procedure: Left Heart Cath and Coronary Angiography;  Surgeon: Peter M Martinique, MD;  Location: [place] [facility];  Service: Cardiovascular;  Laterality: N/A;  . CARDIAC CATHETERIZATION N/A 12/02/2015  Procedure: Coronary Stent Intervention;  Surgeon: Peter M Martinique, MD;  Location: [place] [facility];  Service: Cardiovascular;  Laterality: N/A;  mid LAD Promus 2.5x12  . CORONARY ANGIOPLASTY     one stent placed by Dr. P Martinique  . HARDWARE REMOVAL Right 08/26/2017   Procedure: Right Lumbar Five Revision of pedicle screw with Removal of Lumbar Five Screw;  Surgeon: Kristeen Miss, MD;  Location: [place];  Service: Neurosurgery;  Laterality: Right;  posterior  . HARDWARE REMOVAL Right 12/27/2017   Procedure: Right Lumbar Two, Lumbar Three, Lumbar Four Pedicle screw removal with metrex;  Surgeon: Kristeen Miss, MD;  Location: [place];  Service: Neurosurgery;  Laterality: Right;  Right L2 to L4  Pedicle screw removal with mterex  . MASS EXCISION  06/25/2012   Procedure: EXCISION MASS;  Surgeon: Wynonia Sours, MD;  Location: [place];  Service: Orthopedics;  Laterality: Left;  transection of NEUROMA, BURYING RADIAL NERVE IN BRACHIORADIALIS LEFT SIDE  . radial nerve Left    cut in work injury  . RIGHT GREAT TOENAIL REMOVAL  04/2020  . SPINAL FUSION     C6-7  . TONSILLECTOMY  1982  . VASECTOMY       Current Outpatient Medications  Medication Sig Dispense Refill  . acetaminophen (TYLENOL) 500 MG tablet Take 1,000 mg by mouth daily as needed for mild pain or headache.     [name] amLODipine (NORVASC) 5 MG tablet TAKE 1.5 TABLETS (7.5 MG TOTAL) BY MOUTH DAILY. 135 tablet 3  . aspirin EC 81 MG tablet Take 81 mg by mouth at bedtime.    [name] atorvastatin (LIPITOR) 80 MG tablet Take 1 tablet (80 mg total) by mouth at bedtime. 90 tablet 0  . betamethasone dipropionate (DIPROLENE) 0.05 % cream APPLY TO AFFECTED AREA TWICE A DAY 30 g 0  . diphenhydrAMINE (BENADRYL) 25 MG tablet Take 25 mg by mouth 2 (two) times daily as needed for itching.     [name] EPINEPHRINE 0.3 mg/0.3 mL IJ SOAJ injection INJECT 0.3 MLS (0.3 MG TOTAL) INTO THE MUSCLE AS NEEDED FOR ANAPHYLAXIS. 2 each 3  . fluticasone (FLONASE) 50 MCG/ACT nasal spray Place 2 sprays into both nostrils daily. 48 g 1  . hydrochlorothiazide (HYDRODIURIL) 50 MG tablet TAKE 1 TABLET BY MOUTH EVERY DAY 90 tablet 1  . HYDROcodone-acetaminophen (NORCO/VICODIN) 5-325 MG tablet Take 1 tablet by mouth every 6 (six) hours as needed. for pain    . ketoconazole (NIZORAL) 2 % cream Apply 1 application topically daily.    [name] levothyroxine (SYNTHROID) 100 MCG tablet Take 1 tablet (100 mcg total) by mouth daily. 90 tablet 2  . meloxicam (MOBIC) 7.5 MG tablet Take 7.5 mg by mouth in the morning and at bedtime.    . nitroGLYCERIN (NITROSTAT) 0.4 MG SL tablet PLACE 1 TABLET (0.4 MG TOTAL) UNDER THE TONGUE EVERY 5 (FIVE) MINUTES AS NEEDED FOR CHEST PAIN. 25  tablet 2  . pantoprazole (PROTONIX) 40 MG tablet Take 1 tablet (40 mg total) by mouth 2 (two) times daily. 180 tablet 3  . PARoxetine (PAXIL) 20 MG tablet Take 1 tablet (20 mg total) by mouth daily. 90 tablet 1  . promethazine (PHENERGAN) 25 MG tablet Take 1 tablet (25 mg total) by mouth every 8 (eight) hours as needed for nausea or vomiting. 30 tablet 0   No current facility-administered medications for this visit.    Allergies:   Alpha-gal, Beef (bovine) protein, Beef-derived products, Lambs quarters, Pork-derived products, Prednisone, Brilinta [ticagrelor],  Doxycycline, Gabapentin, Lyrica [pregabalin], Methylprednisolone sodium succ, Penicillins, Shellfish allergy, Adhesive [tape], and Fentanyl    Social History:  The patient  reports that he quit smoking about 13 years ago. His smoking use included cigarettes. He has a 20.00 pack-year smoking history. He has never used smokeless tobacco. He reports current alcohol use. He reports that he does not use drugs.   Family History:  The patient's family history includes Congestive Heart Failure in his mother; Heart attack in his paternal grandfather and paternal grandmother; Heart attack (age of onset: 83) in his father; Heart failure (age of onset: 48) in his mother.    ROS:  General:no colds or fevers, no weight changes Skin:no rashes or ulcers HEENT:no blurred vision, no congestion CV:see HPI PUL:see HPI GI:no diarrhea constipation or melena, no indigestion, + pain abd GU:no hematuria, no dysuria MS:no joint pain, no claudication Neuro:no syncope, no lightheadedness Endo:no diabetes, no thyroid disease  Wt Readings from Last 3 Encounters:  07/11/20 201 lb 9.6 oz (91.4 kg)  06/09/20 201 lb 12.8 oz (91.5 kg)  05/16/20 202 lb 3.2 oz (91.7 kg)     PHYSICAL EXAM: VS:  BP 140/80   Pulse 76   Ht 5\' 11"  (1.803 m)   Wt 201 lb 9.6 oz (91.4 kg)   SpO2 98%   BMI 28.12 kg/m  , BMI Body mass index is 28.12 kg/m. General:Pleasant affect,  NAD Skin:Warm and dry, brisk capillary refill HEENT:normocephalic, sclera clear, mucus membranes moist Neck:supple, no JVD, no bruits  Heart:S1S2 RRR without murmur, gallup, rub or click Lungs:clear without rales, rhonchi, or wheezes ENI:DPOE, non tender, + BS, do not palpate liver spleen or masses Ext:no lower ext edema, 2+ pedal pulses, 2+ radial pulses Neuro:alert and oriented X 3, MAE, follows commands, + facial symmetry    EKG:  EKG is ordered today. The ekg ordered today demonstrates    Recent Labs: 03/22/2020: TSH 1.160 06/09/2020: ALT 23; Hemoglobin 12.9; Platelets 235.0 06/15/2020: BUN 16; Creatinine, Ser 1.08; Magnesium 2.2; Potassium 3.8; Sodium 144    Lipid Panel    Component Value Date/Time   CHOL 136 09/16/2019 1005   CHOL 136 10/29/2012 0922   TRIG 73 09/16/2019 1005   TRIG 139 04/28/2013 1505   TRIG 103 10/29/2012 0922   HDL 54 09/16/2019 1005   HDL 44 04/28/2013 1505   HDL 41 10/29/2012 0922   CHOLHDL 2.5 09/16/2019 1005   LDLCALC 67 09/16/2019 1005   LDLCALC 50 04/28/2013 1505   LDLCALC 74 10/29/2012 4235       Other studies Reviewed: Additional studies/ records that were reviewed today include: . 2/11/21nuc   The left ventricular ejection fraction is hyperdynamic (>65%).  Nuclear stress EF: 73%.  There was no ST segment deviation noted during stress.  Defect 1: There is a small defect of mild severity present in the apical inferior location.  This is a low risk study. No ischemia identified.   09/30/19 monitor NSR Rare PVCs and SVEs One 4 beat SVT run.   No sustained arrhythmias.    ASSESSMENT AND PLAN:  1.  Pre-op for laparoscopic and possible small bowel resection.  Pt with hx of Stent in 2017 with stent to mLAD.  Last nuc 09/2019 neg for ischemia and no chest pain or SOB since that time.  EKG today is stable.  Cardiac risk 6.6%risk of major cardiac event. But meets 4 METS of activity without issue so acceptable risk.  Ok to hold ASA  5-7 days prior to  procedure and resume post op     2. CAD with hx MI in 2017 and PCI to LAD.  Stable  3.  HTN with elevated BP will increase amlodipine to 10 mg daily.    4.  palpitations stable. One episode of 4 beats of SVT rare PVC.    5.  HLD on statin and LDL 67 continue   6.  Pulmonary nodule per PCP  Follow up with Dr. [name] in 6 months.   Current medicines are reviewed with the patient today.  The patient Has no concerns regarding medicines.  The following changes have been made:  See above Labs/ tests ordered today include:see above  Disposition:   FU:  see above  Signed, Cecilie Kicks, NP  07/11/2020 2:14 PM    [address], [place], [place], [place] Phone: [phone]; Fax: [phone]

## 2020-07-11 ENCOUNTER — Other Ambulatory Visit: Payer: Self-pay

## 2020-07-11 ENCOUNTER — Ambulatory Visit (INDEPENDENT_AMBULATORY_CARE_PROVIDER_SITE_OTHER): Payer: Medicare Other | Admitting: Cardiology

## 2020-07-11 ENCOUNTER — Encounter: Payer: Self-pay | Admitting: Cardiology

## 2020-07-11 VITALS — BP 140/80 | HR 76 | Ht 71.0 in | Wt 201.6 lb

## 2020-07-11 DIAGNOSIS — I2510 Atherosclerotic heart disease of native coronary artery without angina pectoris: Secondary | ICD-10-CM

## 2020-07-11 DIAGNOSIS — E782 Mixed hyperlipidemia: Secondary | ICD-10-CM

## 2020-07-11 DIAGNOSIS — Z9861 Coronary angioplasty status: Secondary | ICD-10-CM

## 2020-07-11 DIAGNOSIS — I10 Essential (primary) hypertension: Secondary | ICD-10-CM

## 2020-07-11 DIAGNOSIS — Z01818 Encounter for other preprocedural examination: Secondary | ICD-10-CM

## 2020-07-11 DIAGNOSIS — R002 Palpitations: Secondary | ICD-10-CM

## 2020-07-11 MED ORDER — AMLODIPINE BESYLATE 10 MG PO TABS: 10.0000 mg | ORAL_TABLET | Freq: Every day | ORAL | 3 refills | Status: DC

## 2020-07-11 NOTE — Patient Instructions (Addendum)
Medication Instructions:  Your physician has recommended you make the following change in your medication:  1. INCREASE the Amlodipine to 10 mg taking 1 daily.  You may take 2 of the 5 mg tablets at 1 time to use them up.  *If you need a refill on your cardiac medications before your next appointment, please call your pharmacy*   Lab Work: None ordered  If you have labs (blood work) drawn today and your tests are completely normal, you will receive your results only by: [name] MyChart Message (if you have MyChart) OR . A paper copy in the mail If you have any lab test that is abnormal or we need to change your treatment, we will call you to review the results.   Testing/Procedures: None ordered   Follow-Up: At [facility], you and your health needs are our priority.  As part of our continuing mission to provide you with exceptional heart care, we have created designated Provider Care Teams.  These Care Teams include your primary Cardiologist (physician) and Advanced Practice Providers (APPs -  Physician Assistants and Nurse Practitioners) who all work together to provide you with the care you need, when you need it.  We recommend signing up for the patient portal called "MyChart".  Sign up information is provided on this After Visit Summary.  MyChart is used to connect with patients for Virtual Visits (Telemedicine).  Patients are able to view lab/test results, encounter notes, upcoming appointments, etc.  Non-urgent messages can be sent to your provider as well.   To learn more about what you can do with MyChart, go to [email or web address].    Your next appointment:   3 month(s)  The format for your next appointment:   In Person  Provider:   Minus Breeding, MD   Other Instructions

## 2020-07-20 ENCOUNTER — Other Ambulatory Visit: Payer: Self-pay | Admitting: Family Medicine

## 2020-07-27 ENCOUNTER — Encounter: Payer: Self-pay | Admitting: *Deleted

## 2020-08-03 ENCOUNTER — Other Ambulatory Visit: Payer: Self-pay | Admitting: Family Medicine

## 2020-08-15 ENCOUNTER — Other Ambulatory Visit: Payer: Self-pay | Admitting: Family Medicine

## 2020-08-25 ENCOUNTER — Other Ambulatory Visit: Payer: Self-pay

## 2020-08-25 ENCOUNTER — Encounter (HOSPITAL_COMMUNITY): Payer: Self-pay

## 2020-08-25 ENCOUNTER — Encounter (HOSPITAL_COMMUNITY)
Admission: RE | Admit: 2020-08-25 | Discharge: 2020-08-25 | Disposition: A | Discharge status: Home / Self Care | Payer: Medicare Other | Source: Ambulatory Visit | Attending: General Surgery | Admitting: General Surgery

## 2020-08-25 DIAGNOSIS — Z01812 Encounter for preprocedural laboratory examination: Secondary | ICD-10-CM | POA: Diagnosis not present

## 2020-08-25 HISTORY — DX: Allergy to other foods: Z91.018

## 2020-08-25 HISTORY — DX: Presence of other specified functional implants: Z96.89

## 2020-08-25 LAB — BASIC METABOLIC PANEL
Anion gap: 11 (ref 5–15)
BUN: 18 mg/dL (ref 6–20)
CO2: 26 mmol/L (ref 22–32)
Calcium: 9.1 mg/dL (ref 8.9–10.3)
Chloride: 101 mmol/L (ref 98–111)
Creatinine, Ser: 1.16 mg/dL (ref 0.61–1.24)
GFR, Estimated: >60 mL/min (ref >60)
Glucose, Bld: 115 mg/dL — ABNORMAL HIGH (ref 70–99)
Potassium: 3.7 mmol/L (ref 3.5–5.1)
Sodium: 138 mmol/L (ref 135–145)

## 2020-08-25 LAB — CBC
HCT: 38.8 % — ABNORMAL LOW (ref 39.0–52.0)
Hemoglobin: 12.5 g/dL — ABNORMAL LOW (ref 13.0–17.0)
MCH: 27.4 pg (ref 26.0–34.0)
MCHC: 32.2 g/dL (ref 30.0–36.0)
MCV: 84.9 fL (ref 80.0–100.0)
Platelets: 231 10*3/uL (ref 150–400)
RBC: 4.57 MIL/uL (ref 4.22–5.81)
RDW: 14.5 % (ref 11.5–15.5)
WBC: 6.2 10*3/uL (ref 4.0–10.5)
nRBC: 0 % (ref 0.0–0.2)

## 2020-08-25 NOTE — Patient Instructions (Addendum)
DUE TO COVID-19 ONLY ONE VISITOR IS ALLOWED TO COME WITH YOU AND STAY IN THE WAITING ROOM ONLY DURING PRE OP AND PROCEDURE DAY OF SURGERY. THE 1 VISITOR  MAY VISIT WITH YOU AFTER SURGERY IN YOUR PRIVATE ROOM DURING VISITING HOURS ONLY!  YOU NEED TO HAVE A COVID 19 TEST ON__1-24-22_____ @_______ , THIS TEST MUST BE DONE BEFORE SURGERY,  COVID TESTING SITE [address] [place] [address], IT IS ON THE RIGHT GOING OUT [address] APPROXIMATELY  2 MINUTES PAST [employer] ON THE RIGHT. ONCE YOUR COVID TEST IS COMPLETED,  PLEASE BEGIN THE QUARANTINE INSTRUCTIONS AS OUTLINED IN YOUR HANDOUT.                Jeffrey Hooper  08/25/2020   Your procedure is scheduled on: 09-01-20   Report to [facility] Main  Entrance   Report to admitting at       0700 AM     Call this number if you have problems the morning of surgery [phone]    Remember: Do not eat food  :After Midnight. You may have clear liquids until 0600 am then nothing by mouth  CLEAR LIQUID DIET Water                                                     Black Coffee and tea, regular and decaf                              Plain Jell-O any favor except red or purple                                         Fruit ices (not with fruit pulp)                                  Iced Popsicles                                                            Cranberry, grape and apple juices Sports drinks like Gatorade Lightly seasoned clear broth or consume(fat free) Sugar, honey syrup  _____________________________________________________________________  BRUSH YOUR TEETH MORNING OF SURGERY AND RINSE YOUR MOUTH OUT, NO CHEWING GUM CANDY OR MINTS.     Take these medicines the morning of surgery with A SIP OF WATER: paxil, levothyroxine, flonase, zyrtec, amlodipine                               You may not have any metal on your body including hair pins and              piercings  Do not wear jewelry,lotions,  powders or perfumes, deodorant                       Men may shave face and neck.  Do not bring valuables to the hospital. [place].  Contacts, dentures or bridgework may not be worn into surgery.      Patients discharged the day of surgery will not be allowed to drive home. IF YOU ARE HAVING SURGERY AND GOING HOME THE SAME DAY, YOU MUST HAVE AN ADULT TO DRIVE YOU HOME AND BE WITH YOU FOR 24 HOURS. YOU MAY GO HOME BY TAXI OR UBER OR ORTHERWISE, BUT AN ADULT MUST ACCOMPANY YOU HOME AND STAY WITH YOU FOR 24 HOURS.  Name and phone number of your driver:  Special Instructions: N/A              Please read over the following fact sheets you were given: _____________________________________________________________________          [facility] - Preparing for Surgery Before surgery, you can play an important role.  Because skin is not sterile, your skin needs to be as free of germs as possible.  You can reduce the number of germs on your skin by washing with CHG (chlorahexidine gluconate) soap before surgery.  CHG is an antiseptic cleaner which kills germs and bonds with the skin to continue killing germs even after washing. Please DO NOT use if you have an allergy to CHG or antibacterial soaps.  If your skin becomes reddened/irritated stop using the CHG and inform your nurse when you arrive at Short Stay. Do not shave (including legs and underarms) for at least 48 hours prior to the first CHG shower.  You may shave your face/neck. Please follow these instructions carefully:  1.  Shower with CHG Soap the night before surgery and the  morning of Surgery.  2.  If you choose to wash your hair, wash your hair first as usual with your  normal  shampoo.  3.  After you shampoo, rinse your hair and body thoroughly to remove the  shampoo.                           4.  Use CHG as you would any other liquid soap.  You can apply chg directly  to the skin and  wash                       Gently with a scrungie or clean washcloth.  5.  Apply the CHG Soap to your body ONLY FROM THE NECK DOWN.   Do not use on face/ open                           Wound or open sores. Avoid contact with eyes, ears mouth and genitals (private parts).                       Wash face,  Genitals (private parts) with your normal soap.             6.  Wash thoroughly, paying special attention to the area where your surgery  will be performed.  7.  Thoroughly rinse your body with warm water from the neck down.  8.  DO NOT shower/wash with your normal soap after using and rinsing off  the CHG Soap.                9.  Pat yourself dry  with a clean towel.            10.  Wear clean pajamas.            11.  Place clean sheets on your bed the night of your first shower and do not  sleep with pets. Day of Surgery : Do not apply any lotions/deodorants the morning of surgery.  Please wear clean clothes to the hospital/surgery center.  FAILURE TO FOLLOW THESE INSTRUCTIONS MAY RESULT IN THE CANCELLATION OF YOUR SURGERY PATIENT SIGNATURE_________________________________  NURSE SIGNATURE__________________________________  ________________________________________________________________________

## 2020-08-25 NOTE — Progress Notes (Addendum)
PCP - Si Gaul Cardiologist - Clearance Cecilie Kicks, NP 07-11-20 epic  PPM/ICD -  Device Orders -  Rep Notified -   Chest x-ray -  EKG - 07-11-20 epic Stress Test - 09-17-19 epic ECHO - 2017 Cardiac Cath - 2017  Sleep Study -  CPAP -   Fasting Blood Sugar -  Checks Blood Sugar _____ times a day  Blood Thinner Instructions: Aspirin Instructions: 81 mg  ERAS Protcol - PRE-SURGERY   COVID TEST- 1-24  Activity-able to walk a flight of stairs without sob  Anesthesia review: CAD stent x1, HTN, Alpha -Gal  Patient denies shortness of breath, fever, cough and chest pain at PAT appointment  NONE   All instructions explained to the patient, with a verbal understanding of the material. Patient agrees to go over the instructions while at home for a better understanding. Patient also instructed to self quarantine after being tested for COVID-19. The opportunity to ask questions was provided.

## 2020-08-26 NOTE — Anesthesia Preprocedure Evaluation (Addendum)
Anesthesia Evaluation  Patient identified by MRN, date of birth, ID band Patient awake    Reviewed: Allergy & Precautions, NPO status , Patient's Chart, lab work & pertinent test results  Airway Mallampati: III  TM Distance: <3 FB Neck ROM: Full    Dental  (+) Teeth Intact, Dental Advisory Given   Pulmonary former smoker,    Pulmonary exam normal breath sounds clear to auscultation       Cardiovascular hypertension, Pt. on medications (-) angina+ CAD and + Cardiac Stents (DES to LAD 11/2015)  Normal cardiovascular exam+ dysrhythmias  Rhythm:Regular Rate:Normal  Stress Test 09/17/2019 The left ventricular ejection fraction is hyperdynamic (>65%). Nuclear stress EF: 73%. There was no ST segment deviation noted during stress. Defect 1: There is a small defect of mild severity present in the apical inferior location. This is a low risk study. No ischemia identified.    Neuro/Psych PSYCHIATRIC DISORDERS Anxiety S/p SCS implant  Neuromuscular disease (LEFT RADIAL NERVE SURGERY R/T TRAUMA)    GI/Hepatic Neg liver ROS, GERD  Medicated and Controlled,  Endo/Other  Hypothyroidism   Renal/GU Renal InsufficiencyRenal disease     Musculoskeletal  (+) Arthritis ,   Abdominal   Peds  Hematology  (+) Blood dyscrasia, anemia ,   Anesthesia Other Findings Day of surgery medications reviewed with the patient.  Alpha-gal allergy   Reproductive/Obstetrics                           Anesthesia Physical Anesthesia Plan  ASA: III  Anesthesia Plan: General   Post-op Pain Management:    Induction: Intravenous  PONV Risk Score and Plan: 3 and Midazolam, Dexamethasone, Ondansetron and Diphenhydramine  Airway Management Planned: Oral ETT and Video Laryngoscope Planned  Additional Equipment:   Intra-op Plan:   Post-operative Plan: Extubation in OR  Informed Consent: I have reviewed the patients  History and Physical, chart, labs and discussed the procedure including the risks, benefits and alternatives for the proposed anesthesia with the patient or authorized representative who has indicated his/her understanding and acceptance.     Dental advisory given  Plan Discussed with: CRNA  Anesthesia Plan Comments: (See PAT note 08/25/2020, Konrad Felix, PA-C  2nd PIV after induction)      Anesthesia Quick Evaluation

## 2020-08-26 NOTE — Progress Notes (Signed)
Anesthesia Chart Review   Case: [id number] Date/Time: 09/01/20 0845   Procedures:      LAPAROSCOPY DIAGNOSTIC (N/A )     POSSIBLE SMALL BOWEL RESECTION (N/A )     PRIMARY UMBILICAL HERNIA (N/A )   Anesthesia type: General   Pre-op diagnosis: LUQ ABDOMINAL PAIN   Location: [facility] ROOM 01 / [facility]   Surgeons: Leighton Ruff, MD      [id number]:60 y.o. former smoker with h/o HTN, GERD, CAD (DES to LAD 11/2015), LUQ abdominal pain scheduled for above procedure 7/82/95 with Dr. Leighton Ruff.   Pt with spinal cord stimulator in place, advise to bring remote DOS.   Pt with Alpha-gal allergy.   Pt last seen by cardiology 07/11/2020. Per OV note, "Pre-op for laparoscopic and possible small bowel resection.  Pt with hx of Stent in 2017 with stent to mLAD.  Last nuc 09/2019 neg for ischemia and no chest pain or SOB since that time.  EKG today is stable.  Cardiac risk 6.6%risk of major cardiac event. But meets 4 METS of activity without issue so acceptable risk.  Ok to hold ASA 5-7 days prior to procedure and resume post op"  Anticipate pt can proceed with planned procedure barring acute status change.   VS: BP 132/66   Pulse 62   Temp 36.7 C (Oral)   Resp 16   Ht 5' 10.5" (1.791 m)   Wt 93 kg   SpO2 98%   BMI 29.00 kg/m   PROVIDERS: Janora Norlander, DO is PCP   Minus Breeding, MD is Cardiologist  LABS: Labs reviewed: Acceptable for surgery. (all labs ordered are listed, but only abnormal results are displayed)  Labs Reviewed  BASIC METABOLIC PANEL - Abnormal; Notable for the following components:      Result Value   Glucose, Bld 115 (*)    All other components within normal limits  CBC - Abnormal; Notable for the following components:   Hemoglobin 12.5 (*)    HCT 38.8 (*)    All other components within normal limits     IMAGES:   EKG: 07/11/2020 Rate 76 bpm  NSR  CV: Stress Test 09/17/2019  The left ventricular ejection fraction is hyperdynamic (>65%).  Nuclear  stress EF: 73%.  There was no ST segment deviation noted during stress.  Defect 1: There is a small defect of mild severity present in the apical inferior location.  This is a low risk study. No ischemia identified.   Echo 11/25/2015 Study Conclusions   - Left ventricle: The cavity size was normal. Systolic function was  normal. The estimated ejection fraction was in the range of 60%  to 65%. Wall motion was normal; there were no regional wall  motion abnormalities.  - Aortic valve: There was trivial regurgitation.  - Mitral valve: There was mild regurgitation.  - Left atrium: The atrium was mildly dilated.  Past Medical History:  Diagnosis Date  . Allergy   . Allergy to alpha-gal   . Anxiety   . Arthritis   . CAD (coronary artery disease), native coronary artery 12/03/2015  . Coronary artery disease   . Dysrhythmia    "irregular and PAC'S"  . GERD (gastroesophageal reflux disease)   . Head injury with loss of consciousness ([place])    back in the 1980's  . History of kidney stones   . Hyperlipidemia   . Hypertension   . Hypothyroid   . Multiple benign nevi   . Neuromuscular disorder ([place]) 2011  LEFT RADIAL NERVE SURGERY R/T TRAUMA  . Pneumonia 2014, 2017  . Prostatism   . Pulmonary nodule   . Spinal cord stimulator status    told to bring remote to surgery    Past Surgical History:  Procedure Laterality Date  . BACK SURGERY     T12 - L1, 11 times including neck  . CARDIAC CATHETERIZATION N/A 12/02/2015   Procedure: Left Heart Cath and Coronary Angiography;  Surgeon: Peter M Martinique, MD;  Location: [place] [facility];  Service: Cardiovascular;  Laterality: N/A;  . CARDIAC CATHETERIZATION N/A 12/02/2015   Procedure: Coronary Stent Intervention;  Surgeon: Peter M Martinique, MD;  Location: [place] [facility];  Service: Cardiovascular;  Laterality: N/A;  mid LAD Promus 2.5x12  . CORONARY ANGIOPLASTY     one stent placed by Dr. P Martinique  . HARDWARE REMOVAL Right  08/26/2017   Procedure: Right Lumbar Five Revision of pedicle screw with Removal of Lumbar Five Screw;  Surgeon: Kristeen Miss, MD;  Location: [place];  Service: Neurosurgery;  Laterality: Right;  posterior  . HARDWARE REMOVAL Right 12/27/2017   Procedure: Right Lumbar Two, Lumbar Three, Lumbar Four Pedicle screw removal with metrex;  Surgeon: Kristeen Miss, MD;  Location: [place];  Service: Neurosurgery;  Laterality: Right;  Right L2 to L4 Pedicle screw removal with mterex  . MASS EXCISION  06/25/2012   Procedure: EXCISION MASS;  Surgeon: Wynonia Sours, MD;  Location: [place];  Service: Orthopedics;  Laterality: Left;  transection of NEUROMA, BURYING RADIAL NERVE IN BRACHIORADIALIS LEFT SIDE  . radial nerve Left    cut in work injury  . RIGHT GREAT TOENAIL REMOVAL  04/2020  . SPINAL FUSION     C6-7  . TONSILLECTOMY  1982  . VASECTOMY      MEDICATIONS: . acetaminophen (TYLENOL) 500 MG tablet  . amLODipine (NORVASC) 10 MG tablet  . aspirin EC 81 MG tablet  . atorvastatin (LIPITOR) 80 MG tablet  . betamethasone dipropionate (DIPROLENE) 0.05 % cream  . cetirizine (ZYRTEC) 10 MG tablet  . diphenhydrAMINE (BENADRYL) 25 MG tablet  . EPINEPHRINE 0.3 mg/0.3 mL IJ SOAJ injection  . fluticasone (FLONASE) 50 MCG/ACT nasal spray  . hydrochlorothiazide (HYDRODIURIL) 50 MG tablet  . HYDROcodone-acetaminophen (NORCO/VICODIN) 5-325 MG tablet  . ketoconazole (NIZORAL) 2 % shampoo  . levothyroxine (SYNTHROID) 100 MCG tablet  . meloxicam (MOBIC) 7.5 MG tablet  . nitroGLYCERIN (NITROSTAT) 0.4 MG SL tablet  . pantoprazole (PROTONIX) 40 MG tablet  . PARoxetine (PAXIL) 20 MG tablet  . promethazine (PHENERGAN) 25 MG tablet   No current facility-administered medications for this encounter.     Konrad Felix, PA-C [facility] Testing [phone]

## 2020-08-29 ENCOUNTER — Other Ambulatory Visit (HOSPITAL_COMMUNITY)
Admission: RE | Admit: 2020-08-29 | Discharge: 2020-08-29 | Disposition: A | Discharge status: Home / Self Care | Payer: Medicare Other | Source: Ambulatory Visit | Attending: General Surgery | Admitting: General Surgery

## 2020-08-29 DIAGNOSIS — Z01812 Encounter for preprocedural laboratory examination: Secondary | ICD-10-CM | POA: Insufficient documentation

## 2020-08-29 DIAGNOSIS — Z20822 Contact with and (suspected) exposure to covid-19: Secondary | ICD-10-CM | POA: Insufficient documentation

## 2020-08-29 LAB — SARS CORONAVIRUS 2 (TAT 6-24 HRS): SARS Coronavirus 2: NEGATIVE

## 2020-09-01 ENCOUNTER — Encounter (HOSPITAL_COMMUNITY)
Admission: RE | Disposition: A | Discharge status: Home / Self Care | Payer: Self-pay | Source: Home / Self Care | Attending: General Surgery

## 2020-09-01 ENCOUNTER — Ambulatory Visit (HOSPITAL_COMMUNITY)
Admission: RE | Admit: 2020-09-01 | Discharge: 2020-09-01 | Disposition: A | Discharge status: Home / Self Care | Payer: Medicare Other | Attending: General Surgery | Admitting: General Surgery

## 2020-09-01 ENCOUNTER — Inpatient Hospital Stay (HOSPITAL_COMMUNITY): Payer: Medicare Other | Admitting: Physician Assistant

## 2020-09-01 ENCOUNTER — Encounter (HOSPITAL_COMMUNITY): Payer: Self-pay | Admitting: General Surgery

## 2020-09-01 ENCOUNTER — Inpatient Hospital Stay (HOSPITAL_COMMUNITY): Payer: Medicare Other | Admitting: Anesthesiology

## 2020-09-01 DIAGNOSIS — K429 Umbilical hernia without obstruction or gangrene: Secondary | ICD-10-CM | POA: Diagnosis not present

## 2020-09-01 DIAGNOSIS — Z881 Allergy status to other antibiotic agents status: Secondary | ICD-10-CM | POA: Diagnosis not present

## 2020-09-01 DIAGNOSIS — Z884 Allergy status to anesthetic agent status: Secondary | ICD-10-CM | POA: Insufficient documentation

## 2020-09-01 DIAGNOSIS — Z888 Allergy status to other drugs, medicaments and biological substances status: Secondary | ICD-10-CM | POA: Diagnosis not present

## 2020-09-01 DIAGNOSIS — Z880 Allergy status to penicillin: Secondary | ICD-10-CM | POA: Insufficient documentation

## 2020-09-01 DIAGNOSIS — Z87891 Personal history of nicotine dependence: Secondary | ICD-10-CM | POA: Diagnosis not present

## 2020-09-01 DIAGNOSIS — Z885 Allergy status to narcotic agent status: Secondary | ICD-10-CM | POA: Insufficient documentation

## 2020-09-01 HISTORY — PX: LAPAROSCOPY: SHX197

## 2020-09-01 HISTORY — PX: UMBILICAL HERNIA REPAIR: SHX196

## 2020-09-01 SURGERY — LAPAROSCOPY, DIAGNOSTIC
Anesthesia: General | Site: Abdomen

## 2020-09-01 MED ORDER — LACTATED RINGERS IV SOLN: INTRAVENOUS | Status: DC | PRN

## 2020-09-01 MED ORDER — SODIUM CHLORIDE 0.9% FLUSH: 3.0000 mL | Freq: Two times a day (BID) | INTRAVENOUS | Status: DC

## 2020-09-01 MED ORDER — LIDOCAINE HCL (PF) 2 % IJ SOLN
INTRAMUSCULAR | Status: DC | PRN
  Administered 2020-09-01: 1 mg/kg/h via INTRADERMAL

## 2020-09-01 MED ORDER — ROCURONIUM BROMIDE 100 MG/10ML IV SOLN
INTRAVENOUS | Status: DC | PRN
  Administered 2020-09-01: 70 mg via INTRAVENOUS

## 2020-09-01 MED ORDER — KETAMINE HCL 10 MG/ML IJ SOLN
INTRAMUSCULAR | Status: DC | PRN
  Administered 2020-09-01: 50 mg via INTRAVENOUS

## 2020-09-01 MED ORDER — ONDANSETRON HCL 4 MG/2ML IJ SOLN
INTRAMUSCULAR | Status: AC
  Filled 2020-09-01: qty 2

## 2020-09-01 MED ORDER — DEXAMETHASONE SODIUM PHOSPHATE 10 MG/ML IJ SOLN
INTRAMUSCULAR | Status: DC | PRN
  Administered 2020-09-01: 10 mg via INTRAVENOUS

## 2020-09-01 MED ORDER — SUCCINYLCHOLINE CHLORIDE 200 MG/10ML IV SOSY
PREFILLED_SYRINGE | INTRAVENOUS | Status: AC
  Filled 2020-09-01: qty 10

## 2020-09-01 MED ORDER — DEXAMETHASONE SODIUM PHOSPHATE 10 MG/ML IJ SOLN
INTRAMUSCULAR | Status: AC
  Filled 2020-09-01: qty 1

## 2020-09-01 MED ORDER — CHLORHEXIDINE GLUCONATE 0.12 % MT SOLN: 15.0000 mL | Freq: Once | OROMUCOSAL | Status: AC

## 2020-09-01 MED ORDER — ROCURONIUM BROMIDE 10 MG/ML (PF) SYRINGE
PREFILLED_SYRINGE | INTRAVENOUS | Status: AC
  Filled 2020-09-01: qty 10

## 2020-09-01 MED ORDER — FENTANYL CITRATE (PF) 100 MCG/2ML IJ SOLN
INTRAMUSCULAR | Status: AC
  Filled 2020-09-01: qty 2

## 2020-09-01 MED ORDER — ONDANSETRON HCL 4 MG/2ML IJ SOLN: 4.0000 mg | Freq: Once | INTRAMUSCULAR | Status: DC | PRN

## 2020-09-01 MED ORDER — LACTATED RINGERS IV SOLN: INTRAVENOUS | Status: DC

## 2020-09-01 MED ORDER — ACETAMINOPHEN 10 MG/ML IV SOLN
INTRAVENOUS | Status: DC | PRN
  Administered 2020-09-01: 1000 mg via INTRAVENOUS

## 2020-09-01 MED ORDER — LIDOCAINE HCL (PF) 2 % IJ SOLN
INTRAMUSCULAR | Status: AC
  Filled 2020-09-01: qty 5

## 2020-09-01 MED ORDER — BUPIVACAINE LIPOSOME 1.3 % IJ SUSP
20.0000 mL | Freq: Once | INTRAMUSCULAR | Status: AC
  Administered 2020-09-01: 20 mL
  Filled 2020-09-01: qty 20

## 2020-09-01 MED ORDER — DIPHENHYDRAMINE HCL 50 MG/ML IJ SOLN
INTRAMUSCULAR | Status: DC | PRN
  Administered 2020-09-01: 25 mg via INTRAVENOUS

## 2020-09-01 MED ORDER — FENTANYL CITRATE (PF) 100 MCG/2ML IJ SOLN
INTRAMUSCULAR | Status: DC | PRN
  Administered 2020-09-01: 100 ug via INTRAVENOUS

## 2020-09-01 MED ORDER — OXYCODONE HCL 5 MG PO TABS
ORAL_TABLET | ORAL | Status: AC
  Filled 2020-09-01: qty 1

## 2020-09-01 MED ORDER — BUPIVACAINE-EPINEPHRINE (PF) 0.25% -1:200000 IJ SOLN
INTRAMUSCULAR | Status: AC
  Filled 2020-09-01: qty 30

## 2020-09-01 MED ORDER — MIDAZOLAM HCL 5 MG/5ML IJ SOLN
INTRAMUSCULAR | Status: DC | PRN
  Administered 2020-09-01: 2 mg via INTRAVENOUS

## 2020-09-01 MED ORDER — PROPOFOL 10 MG/ML IV BOLUS
INTRAVENOUS | Status: AC
  Filled 2020-09-01: qty 20

## 2020-09-01 MED ORDER — ORAL CARE MOUTH RINSE
15.0000 mL | Freq: Once | OROMUCOSAL | Status: AC
  Administered 2020-09-01: 15 mL via OROMUCOSAL

## 2020-09-01 MED ORDER — FENTANYL CITRATE (PF) 100 MCG/2ML IJ SOLN: 25.0000 ug | INTRAMUSCULAR | Status: DC | PRN

## 2020-09-01 MED ORDER — LACTATED RINGERS IR SOLN
Status: DC | PRN
  Administered 2020-09-01: 1000 mL

## 2020-09-01 MED ORDER — BUPIVACAINE HCL (PF) 0.25 % IJ SOLN
INTRAMUSCULAR | Status: DC | PRN
  Administered 2020-09-01: 30 mL

## 2020-09-01 MED ORDER — OXYCODONE HCL 5 MG PO TABS
5.0000 mg | ORAL_TABLET | ORAL | Status: DC | PRN
  Administered 2020-09-01: 5 mg via ORAL

## 2020-09-01 MED ORDER — ACETAMINOPHEN 10 MG/ML IV SOLN
INTRAVENOUS | Status: AC
  Filled 2020-09-01: qty 100

## 2020-09-01 MED ORDER — MIDAZOLAM HCL 2 MG/2ML IJ SOLN
INTRAMUSCULAR | Status: AC
  Filled 2020-09-01: qty 2

## 2020-09-01 MED ORDER — 0.9 % SODIUM CHLORIDE (POUR BTL) OPTIME
TOPICAL | Status: DC | PRN
  Administered 2020-09-01: 2000 mL

## 2020-09-01 MED ORDER — PHENYLEPHRINE HCL (PRESSORS) 10 MG/ML IV SOLN
INTRAVENOUS | Status: AC
  Filled 2020-09-01: qty 1

## 2020-09-01 MED ORDER — PHENYLEPHRINE 40 MCG/ML (10ML) SYRINGE FOR IV PUSH (FOR BLOOD PRESSURE SUPPORT)
PREFILLED_SYRINGE | INTRAVENOUS | Status: AC
  Filled 2020-09-01: qty 10

## 2020-09-01 MED ORDER — ONDANSETRON HCL 4 MG/2ML IJ SOLN
INTRAMUSCULAR | Status: DC | PRN
  Administered 2020-09-01: 4 mg via INTRAVENOUS

## 2020-09-01 MED ORDER — KETAMINE HCL 10 MG/ML IJ SOLN
INTRAMUSCULAR | Status: AC
  Filled 2020-09-01: qty 1

## 2020-09-01 MED ORDER — CEFAZOLIN SODIUM-DEXTROSE 2-4 GM/100ML-% IV SOLN
2.0000 g | INTRAVENOUS | Status: AC
  Administered 2020-09-01: 2 g via INTRAVENOUS
  Filled 2020-09-01: qty 100

## 2020-09-01 MED ORDER — SUGAMMADEX SODIUM 200 MG/2ML IV SOLN
INTRAVENOUS | Status: DC | PRN
  Administered 2020-09-01: 400 mg via INTRAVENOUS

## 2020-09-01 MED ORDER — ACETAMINOPHEN 500 MG PO TABS
1000.0000 mg | ORAL_TABLET | Freq: Once | ORAL | Status: DC
  Filled 2020-09-01: qty 2

## 2020-09-01 MED ORDER — PROPOFOL 10 MG/ML IV BOLUS
INTRAVENOUS | Status: DC | PRN
  Administered 2020-09-01: 150 mg via INTRAVENOUS

## 2020-09-01 MED ORDER — ACETAMINOPHEN 650 MG RE SUPP
650.0000 mg | RECTAL | Status: DC | PRN
  Filled 2020-09-01: qty 1

## 2020-09-01 MED ORDER — ACETAMINOPHEN 325 MG PO TABS: 650.0000 mg | ORAL_TABLET | ORAL | Status: DC | PRN

## 2020-09-01 SURGICAL SUPPLY — 49 items
ADH SKN CLS APL DERMABOND .7 (GAUZE/BANDAGES/DRESSINGS) ×2
APPLIER CLIP 5 13 M/L LIGAMAX5 (MISCELLANEOUS)
APPLIER CLIP ROT 10 11.4 M/L (STAPLE)
APR CLP MED LRG 11.4X10 (STAPLE)
APR CLP MED LRG 5 ANG JAW (MISCELLANEOUS)
BLADE EXTENDED COATED 6.5IN (ELECTRODE) IMPLANT
CABLE HIGH FREQUENCY MONO STRZ (ELECTRODE) IMPLANT
CLIP APPLIE 5 13 M/L LIGAMAX5 (MISCELLANEOUS) IMPLANT
CLIP APPLIE ROT 10 11.4 M/L (STAPLE) IMPLANT
COVER MAYO STAND STRL (DRAPES) ×4 IMPLANT
COVER WAND RF STERILE (DRAPES) IMPLANT
DECANTER SPIKE VIAL GLASS SM (MISCELLANEOUS) ×4 IMPLANT
DERMABOND ADVANCED (GAUZE/BANDAGES/DRESSINGS) ×2
DERMABOND ADVANCED .7 DNX12 (GAUZE/BANDAGES/DRESSINGS) ×1 IMPLANT
DEVICE TROCAR PUNCTURE CLOSURE (ENDOMECHANICALS) ×3 IMPLANT
ELECT REM PT RETURN 15FT ADLT (MISCELLANEOUS) ×4 IMPLANT
GAUZE SPONGE 4X4 12PLY STRL (GAUZE/BANDAGES/DRESSINGS) ×4 IMPLANT
GLOVE SURG ENC MOIS LTX SZ6.5 (GLOVE) ×8 IMPLANT
GLOVE SURG UNDER POLY LF SZ7 (GLOVE) ×4 IMPLANT
GOWN STRL REUS W/TWL XL LVL3 (GOWN DISPOSABLE) ×12 IMPLANT
HANDLE SUCTION POOLE (INSTRUMENTS) IMPLANT
IRRIG SUCT STRYKERFLOW 2 WTIP (MISCELLANEOUS)
IRRIGATION SUCT STRKRFLW 2 WTP (MISCELLANEOUS) ×1 IMPLANT
KIT TURNOVER KIT A (KITS) ×3 IMPLANT
PACK COLON (CUSTOM PROCEDURE TRAY) ×4 IMPLANT
SEALER TISSUE G2 STRG ARTC 35C (ENDOMECHANICALS) IMPLANT
SLEEVE XCEL OPT CAN 5 100 (ENDOMECHANICALS) ×7 IMPLANT
SPONGE LAP 18X18 RF (DISPOSABLE) IMPLANT
STAPLER VISISTAT 35W (STAPLE) IMPLANT
SUCTION POOLE HANDLE (INSTRUMENTS)
SUT NOVA 0 T19/GS 22DT (SUTURE) ×3 IMPLANT
SUT NOVA 1 T20/GS 25DT (SUTURE) IMPLANT
SUT PDS AB 1 TP1 96 (SUTURE) IMPLANT
SUT PROLENE 2 0 KS (SUTURE) IMPLANT
SUT PROLENE 2 0 SH DA (SUTURE) IMPLANT
SUT SILK 2 0 (SUTURE) ×4
SUT SILK 2 0 SH CR/8 (SUTURE) ×1 IMPLANT
SUT SILK 2-0 18XBRD TIE 12 (SUTURE) ×2 IMPLANT
SUT SILK 3 0 (SUTURE) ×4
SUT SILK 3 0 SH CR/8 (SUTURE) ×4 IMPLANT
SUT SILK 3-0 18XBRD TIE 12 (SUTURE) ×2 IMPLANT
SUT VIC AB 2-0 SH 27 (SUTURE) ×4
SUT VIC AB 2-0 SH 27X BRD (SUTURE) ×1 IMPLANT
TOWEL OR 17X26 10 PK STRL BLUE (TOWEL DISPOSABLE) ×3 IMPLANT
TOWEL OR NON WOVEN STRL DISP B (DISPOSABLE) ×4 IMPLANT
TRAY FOLEY MTR SLVR 16FR STAT (SET/KITS/TRAYS/PACK) ×4 IMPLANT
TROCAR BLADELESS OPT 5 100 (ENDOMECHANICALS) ×4 IMPLANT
TROCAR XCEL BLUNT TIP 100MML (ENDOMECHANICALS) IMPLANT
TROCAR XCEL NON-BLD 11X100MML (ENDOMECHANICALS) IMPLANT

## 2020-09-01 NOTE — Discharge Instructions (Signed)

## 2020-09-01 NOTE — H&P (Signed)
The patient is a 60 year old male who presents with abdominal pain. 60 year old male who presents to the office for evaluation of left upper quadrant abdominal pain and CT scan showing a possible intussusception in the same area. Patient denies any past surgical history within the abdomen. He was referred to GI for left upper quadrant pain several months ago. He reports nausea and decreased appetite. He also notices the pain when he takes a large deep breath in. He is also is complaining of diarrhea but denies any melena..   Past Surgical History Darden Palmer, [place]; 07/04/2020 1:39 PM) Spinal Surgery - Lower Back Tonsillectomy Vasectomy  Diagnostic Studies History Darden Palmer, [place]; 07/04/2020 1:39 PM) Colonoscopy 5-10 years ago  Allergies  predniSONE *CORTICOSTEROIDS* Brilinta *HEMATOLOGICAL AGENTS - MISC.* Hives. Doxycycline Hyclate *TETRACYCLINES* Gabapentin *CHEMICALS* Swelling. methylPREDNISolone *CORTICOSTEROIDS* Penicillin V Potassium *PENICILLINS* Hives. Adhesive Paper *MEDICAL DEVICES AND SUPPLIES* fentaNYL *ANALGESICS - OPIOID* Allergies Reconciled  Medication History  amLODIPine Besylate (5MG  Tablet, Oral) Active. Aspir-Low (81MG  Tablet DR, Oral) Active. Tylenol Extra Strength (500MG  Tablet, Oral) Active. Benadryl Allergy (25MG  Capsule, Oral) Active. Flonase (50MCG/ACT Suspension, Nasal) Active. EPINEPHrine (0.3MG /0.3ML Device, Injection) Active. hydroCHLOROthiazide (50MG  Tablet, Oral) Active. HYDROcodone-Acetaminophen (5-325MG  Tablet, Oral) Active. Ketoconazole (2% Cream, External) Active. Levocetirizine Dihydrochloride (5MG  Tablet, Oral) Active. Levothyroxine Sodium (100MCG Tablet, Oral) Active. Meloxicam (7.5MG  Tablet, Oral) Active. Nitroglycerin (0.4MG  Tab Sublingual, Sublingual) Active. Ondansetron (4MG  Tablet Disint, Oral) Active. Pantoprazole Sodium (40MG  Tablet DR, Oral) Active. PARoxetine HCl (20MG  Tablet, Oral)  Active. Promethazine HCl (25MG  Tablet, Oral) Active. Medications Reconciled  Social History Darden Palmer, [place]; 07/04/2020 1:39 PM) Alcohol use Occasional alcohol use. Caffeine use Carbonated beverages, Coffee. No drug use Tobacco use Former smoker.  Family History Darden Palmer, [place]; 07/04/2020 1:39 PM) Alcohol Abuse Brother. Arthritis Father, Mother. Heart Disease Father. Hypertension Brother, Father, Sister.  Other Problems Darden Palmer, [place]; 07/04/2020 1:39 PM) Back Pain Chest pain Gastroesophageal Reflux Disease Hemorrhoids High blood pressure Hypercholesterolemia Kidney Stone Thyroid Disease     Review of Systems  General Present- Appetite Loss. Not Present- Chills, Fatigue, Fever, Night Sweats, Weight Gain and Weight Loss. Skin Present- Dryness. Not Present- Change in Wart/Mole, Hives, Jaundice, New Lesions, Non-Healing Wounds, Rash and Ulcer. HEENT Present- Hearing Loss, Ringing in the Ears, Seasonal Allergies and Wears glasses/contact lenses. Not Present- Earache, Hoarseness, Nose Bleed, Oral Ulcers, Sinus Pain, Sore Throat, Visual Disturbances and Yellow Eyes. Respiratory Present- Snoring. Not Present- Bloody sputum, Chronic Cough, Difficulty Breathing and Wheezing. Breast Not Present- Breast Mass, Breast Pain, Nipple Discharge and Skin Changes. Cardiovascular Present- Palpitations. Not Present- Chest Pain, Difficulty Breathing Lying Down, Leg Cramps, Rapid Heart Rate, Shortness of Breath and Swelling of Extremities. Gastrointestinal Present- Abdominal Pain, Bloating, Change in Bowel Habits, Excessive gas, Hemorrhoids and Nausea. Not Present- Bloody Stool, Chronic diarrhea, Constipation, Difficulty Swallowing, Gets full quickly at meals, Indigestion, Rectal Pain and Vomiting. Male Genitourinary Not Present- Blood in Urine, Change in Urinary Stream, Frequency, Impotence, Nocturia, Painful Urination, Urgency and Urine Leakage. Musculoskeletal  Present- Back Pain. Not Present- Joint Pain, Joint Stiffness, Muscle Pain, Muscle Weakness and Swelling of Extremities. Neurological Present- Headaches, Numbness, Tingling and Weakness. Not Present- Decreased Memory, Fainting, Seizures, Tremor and Trouble walking. Psychiatric Not Present- Anxiety, Bipolar, Change in Sleep Pattern, Depression, Fearful and Frequent crying. Endocrine Not Present- Cold Intolerance, Excessive Hunger, Hair Changes, Heat Intolerance, Hot flashes and New Diabetes. Hematology Not Present- Blood Thinners, Easy Bruising, Excessive bleeding, Gland problems, HIV and Persistent Infections.  BP (!) 141/84   Pulse 89  Temp 98.1 F (36.7 C) (Oral)   Resp 18   Ht 5' 10.5" (1.791 m)   Wt 93 kg   SpO2 99%   BMI 29.00 kg/m      Physical Exam   General Mental Status-Alert. General Appearance-Cooperative. CV: RRR Lungs: CTA Abdomen Note: No masses palpated on physical exam. Small umbilical hernia. Tenderness to palpation in the left upper quadrant.    Assessment & Plan   LUQ ABDOMINAL PAIN (R10.12) Impression: 60 year old male with left upper quadrant pain and abnormal pathology seen on CT scan. We discussed the risk and benefits of proceeding with diagnostic laparoscopy and possible small bowel resection. He does have a small umbilical hernia that we can repair at the same time. He understands there is a risk that no pathology will be noted in his symptoms. Will continue after surgery. There is also a risk that we could remove the pathology and he continued to have his current symptoms. Other risks including bleeding, damage to adjacent structures, and postoperative ileus were discussed as well. All questions were answered.

## 2020-09-01 NOTE — Anesthesia Postprocedure Evaluation (Signed)
Anesthesia Post Note  Patient: JAMAN ARO  Procedure(s) Performed: LAPAROSCOPY DIAGNOSTIC (N/A Abdomen) PRIMARY UMBILICAL HERNIA REPAIR (N/A Abdomen)     Patient location during evaluation: PACU Anesthesia Type: General Level of consciousness: awake and alert Pain management: pain level controlled Vital Signs Assessment: post-procedure vital signs reviewed and stable Respiratory status: spontaneous breathing, nonlabored ventilation and respiratory function stable Cardiovascular status: blood pressure returned to baseline and stable Postop Assessment: no apparent nausea or vomiting Anesthetic complications: no   No complications documented.  Last Vitals:  Vitals:   09/01/20 1030 09/01/20 1040  BP:  112/66  Pulse: 81 79  Resp: (!) 22 15  Temp: 36.6 C 36.7 C  SpO2: 95% 97%    Last Pain:  Vitals:   09/01/20 1040  TempSrc:   PainSc: 0-No pain                 Catalina Gravel

## 2020-09-01 NOTE — Op Note (Deleted)
Surgeon: Fredrick Geoghegan, MD  Assistants: Vignesh Raman, MD--resident  Anesthesia: General endotracheal anesthesia  ASA Class: 2  Operation: Diagnostic laparoscopy Umbilical hernia repair  Indication: 59 year-old man with persistent left upper quadrant pain and CT showing jejunal intussusception who presents for diagnostic laparoscopy and possible bowel resection.   Findings: - No intussusception or mechanical obstruction of small bowel - No other left upper quadrant pathology identified  - No peritoneal carcinomatosis  - Small fat containing umbilical hernia   Operative details: The patient was placed supine on the table and general endotracheal anesthesia was induced. The abdomen was prepped and draped. Following a timeout, a Veress needle was inserted in the left subcostal area and the abdominal cavity was insufflated. An supraumbilical 5 mm port was placed and laparoscopy revealed no injury from the Veress needle. Two right hemiabdominal 5 mm ports were placed in addition. The small bowel was run from the ligament of Treitz to the cecum twice. No intussusception or obstructive pathology was noted. The stomach, spleen, and colon were inspected and no pathology was found. All four quadrants were inspected and no abnormality was encountered. We then repaired the patient's small umbilical hernia with an interrupted 0 Novofil stitch across the fascial defect with the use of a laparoscopic suture passer. A 2-0 Vicryl deep dermal stitches was placed in the umbilical incision. Skin was closed with 4-0 Monocryl at all three port sites. Skin glue was applied. The patient was extubated and transferred to the recovery room.   Specimen: None  Complications: None   I was present for the critical and key portions of the surgery and I was immediately available throughout the entire procedure.  I have reviewed and agree with the operative note as documented by the resident. 

## 2020-09-01 NOTE — Transfer of Care (Addendum)
Immediate Anesthesia Transfer of Care Note  Patient: CHIRON CAMPIONE  Procedure(s) Performed: LAPAROSCOPY DIAGNOSTIC (N/A Abdomen) PRIMARY UMBILICAL HERNIA REPAIR (N/A Abdomen)  Patient Location: PACU  Anesthesia Type:General  Level of Consciousness: awake, alert  and oriented  Airway & Oxygen Therapy: Patient connected to face mask oxygen  Post-op Assessment: Report given to RN, Post -op Vital signs reviewed and stable and Patient moving all extremities  Post vital signs: Reviewed and stable  Last Vitals:  Vitals Value Taken Time  BP 134/70 09/01/20 0946  Temp    Pulse 87 09/01/20 0949  Resp 18 09/01/20 0949  SpO2 100 % 09/01/20 0949  Vitals shown include unvalidated device data.  Last Pain:  Vitals:   09/01/20 0655  TempSrc: Oral         Complications: No complications documented.

## 2020-09-01 NOTE — Op Note (Addendum)
Surgeon: Leighton Ruff, MD  Assistants: Berniece Salines, MD--resident  Anesthesia: General endotracheal anesthesia  ASA Class: 2  Operation: Diagnostic laparoscopy Umbilical hernia repair  Indication: 60 year-old man with persistent left upper quadrant pain and CT showing jejunal intussusception who presents for diagnostic laparoscopy and possible bowel resection.   Findings: - No intussusception or mechanical obstruction of small bowel - No other left upper quadrant pathology identified  - No peritoneal carcinomatosis  - Small fat containing umbilical hernia   Operative details: The patient was placed supine on the table and general endotracheal anesthesia was induced. The abdomen was prepped and draped. Following a timeout, a Veress needle was inserted in the left subcostal area and the abdominal cavity was insufflated. An supraumbilical 5 mm port was placed and laparoscopy revealed no injury from the Veress needle. Two right hemiabdominal 5 mm ports were placed in addition. The small bowel was run from the ligament of Treitz to the cecum twice. No intussusception or obstructive pathology was noted. The stomach, spleen, and colon were inspected and no pathology was found. All four quadrants were inspected and no abnormality was encountered. We then repaired the patient's small umbilical hernia with an interrupted 0 Novofil stitch across the fascial defect with the use of a laparoscopic suture passer. A 2-0 Vicryl deep dermal stitches was placed in the umbilical incision. Skin was closed with 4-0 Monocryl at all three port sites. Skin glue was applied. The patient was extubated and transferred to the recovery room.   Specimen: None  Complications: None   I was present for the critical and key portions of the surgery and I was immediately available throughout the entire procedure.  I have reviewed and agree with the operative note as documented by the resident.

## 2020-09-01 NOTE — Anesthesia Procedure Notes (Signed)
Procedure Name: Intubation Date/Time: 09/01/2020 8:27 AM Performed by: Rosaland Lao, CRNA Pre-anesthesia Checklist: Patient identified, Emergency Drugs available, Suction available and Patient being monitored Patient Re-evaluated:Patient Re-evaluated prior to induction Oxygen Delivery Method: Circle system utilized Preoxygenation: Pre-oxygenation with 100% oxygen Induction Type: IV induction Ventilation: Mask ventilation without difficulty Laryngoscope Size: Glidescope and 4 Grade View: Grade I Tube type: Oral Tube size: 7.5 mm Number of attempts: 1 Airway Equipment and Method: Stylet,  Oral airway and Video-laryngoscopy Placement Confirmation: ETT inserted through vocal cords under direct vision,  positive ETCO2 and breath sounds checked- equal and bilateral Secured at: 24 cm Tube secured with: Tape Dental Injury: Teeth and Oropharynx as per pre-operative assessment

## 2020-09-02 ENCOUNTER — Encounter (HOSPITAL_COMMUNITY): Payer: Self-pay | Admitting: General Surgery

## 2020-09-18 ENCOUNTER — Other Ambulatory Visit: Payer: Self-pay | Admitting: Family Medicine

## 2020-09-18 DIAGNOSIS — E782 Mixed hyperlipidemia: Secondary | ICD-10-CM

## 2020-09-21 ENCOUNTER — Ambulatory Visit (INDEPENDENT_AMBULATORY_CARE_PROVIDER_SITE_OTHER): Payer: Medicare Other | Admitting: Family Medicine

## 2020-09-21 ENCOUNTER — Encounter: Payer: Self-pay | Admitting: Family Medicine

## 2020-09-21 ENCOUNTER — Other Ambulatory Visit: Payer: Self-pay

## 2020-09-21 VITALS — BP 115/64 | HR 63 | Temp 98.1°F | Ht 70.0 in | Wt 208.0 lb

## 2020-09-21 DIAGNOSIS — Z8719 Personal history of other diseases of the digestive system: Secondary | ICD-10-CM

## 2020-09-21 DIAGNOSIS — R1012 Left upper quadrant pain: Secondary | ICD-10-CM

## 2020-09-21 DIAGNOSIS — R110 Nausea: Secondary | ICD-10-CM

## 2020-09-21 DIAGNOSIS — I10 Essential (primary) hypertension: Secondary | ICD-10-CM

## 2020-09-21 DIAGNOSIS — R600 Localized edema: Secondary | ICD-10-CM | POA: Diagnosis not present

## 2020-09-21 DIAGNOSIS — Z9889 Other specified postprocedural states: Secondary | ICD-10-CM | POA: Diagnosis not present

## 2020-09-21 DIAGNOSIS — E039 Hypothyroidism, unspecified: Secondary | ICD-10-CM | POA: Diagnosis not present

## 2020-09-21 MED ORDER — ONDANSETRON 4 MG PO TBDP: ORAL_TABLET | ORAL | 0 refills | Status: DC

## 2020-09-21 MED ORDER — PROMETHAZINE HCL 25 MG PO TABS: 12.5000 mg | ORAL_TABLET | Freq: Two times a day (BID) | ORAL | 1 refills | Status: DC | PRN

## 2020-09-21 NOTE — Progress Notes (Signed)
Subjective: CC: s/p Lap hernia repair PCP: Janora Norlander, DO EPP:IRJJO D Mcalexander is a 60 y.o. male presenting to clinic today for:  1. LUQ pain Patient reports ongoing left upper quadrant discomfort.  He describes it as a football pressure in that left upper quadrant.  He has ongoing nausea such that he is using his promethazine twice daily fairly regularly.  The Zofran was not especially helpful when previously trialed.  He has not had any tar dive dyskinesia type symptoms.  No reports of emesis.  He is status post laparoscopic hernia repair.  There was plan for bowel resection as there was evidence of intussusception on imaging but when the surgeon explored his bowel, there was no abnormality found.  He has no follow-up scheduled with GI just yet but he does see surgery on the 22nd of the month.  2. Hypothyroidism/ edema Reports some lower extremity edema that is been going on several weeks (since prior to surgery).  This seems to be more prominent in the evening time.  He does not report any change in breathing patterns.  He has limited mobility secondary to lumbar radiculopathy and utilizes a cane for this.  He is on hydrochlorothiazide for edema and has not missed any doses.  He is compliant with his Synthroid 100 mcg daily.  He takes meloxicam.  No changes in medication regimen.  He does have a history of CAD and his mother has a history of CHF.  He worries about CHF.    ROS: Per HPI  Allergies  Allergen Reactions  . Alpha-Gal Rash and Other (See Comments)    SEVERE ALLERGY TO ANY MEAT OR MEAT DERIVED PRODUCTS FROM 4 LEGGED ANIMALS > > BEEF, PORK , GOATS, DEER, ETC. < < RESULT OF BITE FROM LONE STAR TICK  . Beef (Bovine) Protein Rash and Other (See Comments)  . Beef-Derived Products Rash and Other (See Comments)  . Lambs Quarters Rash and Other (See Comments)  . Pork-Derived Products Rash and Other (See Comments)  . Prednisone Other (See Comments)    im depo medrol caused  dizziness - pt passed out. Ask pt before giving. Pt can take oral prednisone   . Brilinta [Ticagrelor] Hives    Pt ate pork on the same day he took Brilinta before knowing he had alpha gal. May have been the alpha gal reaction, pt is unsure.   . Doxycycline Hives  . Gabapentin Swelling  . Lyrica [Pregabalin]     Dry mouth and felt raw  . Methylprednisolone Sodium Succ     im depo medrol caused dizziness - pt passed out. Ask pt before giving. Issue occurred when administered in the left arm   . Penicillins Hives    Did it involve swelling of the face/tongue/throat, SOB, or low BP? No Did it involve sudden or severe rash/hives, skin peeling, or any reaction on the inside of your mouth or nose? No Did you need to seek medical attention at a hospital or doctor's office? No When did it last happen?10 + years ago If all above answers are "NO", may proceed with cephalosporin use.   . Shellfish Allergy Hives  . Adhesive [Tape] Rash  . Fentanyl Rash    Reaction to adhesive, not the drug    Past Medical History:  Diagnosis Date  . Allergy   . Allergy to alpha-gal   . Anxiety   . Arthritis   . CAD (coronary artery disease), native coronary artery 12/03/2015  . Coronary artery  disease   . Dysrhythmia    "irregular and PAC'S"  . GERD (gastroesophageal reflux disease)   . Head injury with loss of consciousness ([place])    back in the 1980's  . History of kidney stones   . Hyperlipidemia   . Hypertension   . Hypothyroid   . Multiple benign nevi   . Neuromuscular disorder ([place]) 2011   LEFT RADIAL NERVE SURGERY R/T TRAUMA  . Pneumonia 2014, 2017  . Prostatism   . Pulmonary nodule   . Spinal cord stimulator status    told to bring remote to surgery    Current Outpatient Medications:  .  acetaminophen (TYLENOL) 500 MG tablet, Take 1,000 mg by mouth daily as needed for mild pain or headache. , Disp: , Rfl:  .  amLODipine (NORVASC) 10 MG tablet, Take 1 tablet (10 mg total) by mouth  daily., Disp: 90 tablet, Rfl: 3 .  aspirin EC 81 MG tablet, Take 81 mg by mouth at bedtime., Disp: , Rfl:  .  atorvastatin (LIPITOR) 80 MG tablet, TAKE 1 TABLET BY MOUTH EVERYDAY AT BEDTIME, Disp: 90 tablet, Rfl: 0 .  betamethasone dipropionate (DIPROLENE) 0.05 % cream, APPLY TO AFFECTED AREA TWICE A DAY (Patient taking differently: Apply 1 application topically 2 (two) times daily as needed (itching/rash).), Disp: 30 g, Rfl: 0 .  cetirizine (ZYRTEC) 10 MG tablet, Take 10 mg by mouth daily., Disp: , Rfl:  .  diphenhydrAMINE (BENADRYL) 25 MG tablet, Take 25 mg by mouth 2 (two) times daily as needed for itching. , Disp: , Rfl:  .  EPINEPHRINE 0.3 mg/0.3 mL IJ SOAJ injection, INJECT 0.3 MLS (0.3 MG TOTAL) INTO THE MUSCLE AS NEEDED FOR ANAPHYLAXIS., Disp: 2 each, Rfl: 3 .  fluticasone (FLONASE) 50 MCG/ACT nasal spray, Place 2 sprays into both nostrils daily., Disp: 48 g, Rfl: 1 .  hydrochlorothiazide (HYDRODIURIL) 50 MG tablet, TAKE 1 TABLET BY MOUTH EVERY DAY (Patient taking differently: Take 50 mg by mouth daily.), Disp: 90 tablet, Rfl: 1 .  HYDROcodone-acetaminophen (NORCO/VICODIN) 5-325 MG tablet, Take 1 tablet by mouth every 6 (six) hours as needed for moderate pain. for pain, Disp: , Rfl:  .  ketoconazole (NIZORAL) 2 % shampoo, Apply 1 application topically 3 (three) times a week., Disp: , Rfl:  .  levothyroxine (SYNTHROID) 100 MCG tablet, Take 1 tablet (100 mcg total) by mouth daily., Disp: 90 tablet, Rfl: 2 .  meloxicam (MOBIC) 7.5 MG tablet, Take 7.5 mg by mouth in the morning and at bedtime., Disp: , Rfl:  .  nitroGLYCERIN (NITROSTAT) 0.4 MG SL tablet, PLACE 1 TABLET (0.4 MG TOTAL) UNDER THE TONGUE EVERY 5 (FIVE) MINUTES AS NEEDED FOR CHEST PAIN., Disp: 25 tablet, Rfl: 2 .  pantoprazole (PROTONIX) 40 MG tablet, Take 1 tablet (40 mg total) by mouth 2 (two) times daily., Disp: 180 tablet, Rfl: 3 .  PARoxetine (PAXIL) 20 MG tablet, Take 1 tablet (20 mg total) by mouth daily., Disp: 90 tablet, Rfl:  1 .  promethazine (PHENERGAN) 25 MG tablet, TAKE 1 TABLET BY MOUTH EVERY 8 HOURS AS NEEDED FOR NAUSEA AND VOMITING (Patient taking differently: Take 25 mg by mouth every 8 (eight) hours as needed for nausea or vomiting.), Disp: 30 tablet, Rfl: 0 Social History   Socioeconomic History  . Marital status: Married    Spouse name: Not on file  . Number of children: 3  . Years of education: Not on file  . Highest education level: Not on file  Occupational History  .  Occupation: [employer]  Tobacco Use  . Smoking status: Former Smoker    Packs/day: 1.00    Years: 20.00    Pack years: 20.00    Types: Cigarettes    Quit date: 11/28/2006    Years since quitting: 13.8  . Smokeless tobacco: Never Used  Vaping Use  . Vaping Use: Never used  Substance and Sexual Activity  . Alcohol use: Not Currently    Alcohol/week: 0.0 standard drinks    Comment: rarely  . Drug use: No  . Sexual activity: Yes  Other Topics Concern  . Not on file  Social History Narrative   Lives at home with wife, new 33 year old adopted boy.     Social Determinants of Health   Financial Resource Strain: Not on file  Food Insecurity: Not on file  Transportation Needs: Not on file  Physical Activity: Not on file  Stress: Not on file  Social Connections: Not on file  Intimate Partner Violence: Not on file   Family History  Problem Relation Age of Onset  . Heart failure Mother 54       Died age 65  . Congestive Heart Failure Mother   . Heart attack Father 25       Died with MI  . Heart attack Paternal Grandmother   . Heart attack Paternal Grandfather   . Colon cancer Neg Hx     Objective: Office vital signs reviewed. BP 115/64   Pulse 63   Temp 98.1 F (36.7 C) (Temporal)   Ht 5' 10"  (1.778 m)   Wt 208 lb (94.3 kg)   SpO2 100%   BMI 29.84 kg/m   Physical Examination:  General: Awake, alert, well nourished, No acute distress HEENT: Normal; no exophthalmos.  No goiter. Cardio:  regular rate and rhythm, S1S2 heard, no murmurs appreciated Pulm: clear to auscultation bilaterally, no wheezes, rhonchi or rales; normal work of breathing on room air Extremities: warm, well perfused, trace pitting ankle edema, no cyanosis or clubbing; +2 pulses bilaterally MSK: antalgic gait and station  Assessment/ Plan: 60 y.o. male   Lower extremity edema - Plan: Brain natriuretic peptide, CBC, CMP14+EGFR  Acquired hypothyroidism - Plan: Thyroid Panel With TSH  S/P laparoscopic hernia repair  LUQ pain - Plan: promethazine (PHENERGAN) 25 MG tablet, ondansetron (ZOFRAN ODT) 4 MG disintegrating tablet  Nausea - Plan: promethazine (PHENERGAN) 25 MG tablet, ondansetron (ZOFRAN ODT) 4 MG disintegrating tablet  Essential hypertension  Uncertain etiology of lower extremity edema.  No apparent medication changes.  He does have a history of CAD and therefore will check BNP.  There is no appreciated fluid overload in the lungs.  Only trace pitting edema noted at the ankles but he showed me a photograph today which showed quite a bit of edema in the feet such that he developed hyperpigmentation along the dorsal aspects due to rubbing and blistering.  Check CBC, CMP.  Rx for compression hose provided.  Watch salt, keep legs elevated  Check thyroid panel.  Edema may be a manifestation of uncontrolled hypothyroidism.  He is status post laparoscopic hernia repair but has ongoing left upper quadrant pain and nausea.  There was concern for intussusception.  I have asked that he follow-up with gastroenterology and his surgeon for further recommendations.  Unsure where to go at this point since intussusception was not visualized within the bowel on surgical exploration.  Blood pressure is controlled.  Continue current regimen  Would like to see  him back in the next 2 to 3 months, sooner if needed  No orders of the defined types were placed in this encounter.  No orders of the defined types were  placed in this encounter.    Janora Norlander, DO [place] [phone]

## 2020-09-21 NOTE — Patient Instructions (Signed)

## 2020-09-22 LAB — CMP14+EGFR
ALT: 35 IU/L (ref 0–44)
AST: 31 IU/L (ref 0–40)
Albumin/Globulin Ratio: 2.4 — ABNORMAL HIGH (ref 1.2–2.2)
Albumin: 4.6 g/dL (ref 3.8–4.9)
Alkaline Phosphatase: 126 IU/L — ABNORMAL HIGH (ref 44–121)
BUN/Creatinine Ratio: 13 (ref 9–20)
BUN: 15 mg/dL (ref 6–24)
Bilirubin Total: 0.7 mg/dL (ref 0.0–1.2)
CO2: 26 mmol/L (ref 20–29)
Calcium: 9.4 mg/dL (ref 8.7–10.2)
Chloride: 102 mmol/L (ref 96–106)
Creatinine, Ser: 1.15 mg/dL (ref 0.76–1.27)
GFR calc Af Amer: 80 mL/min/{1.73_m2} (ref >59)
GFR calc non Af Amer: 69 mL/min/{1.73_m2} (ref >59)
Globulin, Total: 1.9 g/dL (ref 1.5–4.5)
Glucose: 98 mg/dL (ref 65–99)
Potassium: 4.2 mmol/L (ref 3.5–5.2)
Sodium: 144 mmol/L (ref 134–144)
Total Protein: 6.5 g/dL (ref 6.0–8.5)

## 2020-09-22 LAB — CBC
Hematocrit: 37.9 % (ref 37.5–51.0)
Hemoglobin: 12.6 g/dL — ABNORMAL LOW (ref 13.0–17.7)
MCH: 26.9 pg (ref 26.6–33.0)
MCHC: 33.2 g/dL (ref 31.5–35.7)
MCV: 81 fL (ref 79–97)
Platelets: 260 10*3/uL (ref 150–450)
RBC: 4.69 x10E6/uL (ref 4.14–5.80)
RDW: 14 % (ref 11.6–15.4)
WBC: 6.6 10*3/uL (ref 3.4–10.8)

## 2020-09-22 LAB — BRAIN NATRIURETIC PEPTIDE: BNP: 20.4 pg/mL (ref 0.0–100.0)

## 2020-10-09 NOTE — Progress Notes (Signed)
10/12/2020 Jeffrey Hooper [id number] [birth year]   Chief Complaint:  LUQ pain   History of Present Illness: Jeffrey Hooper. [name] is a 60 year old male with a past medical history of arthritis, anxiety, hypertension, coronary artery disease s/p LAD stent 11/2015, alpha gal, kidney stones, hypothyroidism, pneumonia H1N1 and GERD. Past surgical history includes s/p  L5-S1 fusion 10/27/2019, cervical fusion, tonsillectomy and vasectomy.   I initially saw saw [name]  in the office on 06/09/2020 for further evaluation for left upper quadrant abdominal pain and nausea x 2 to 3 months. An abdominal/pelvic CT scan 06/21/2020 showed evidence of short segment jejunal intussusception he was referred to general surgery.  He underwent a diagnostic laparoscopy by Dr. Leighton Ruff on 0/60/0459 without evidence of intussusception or mechanical obstruction and no other left upper quadrant pathology was identified.  A small fat-containing umbilical hernia was repaired.  He presents to our office today for further evaluation regarding ongoing left upper quadrant abdominal pain.  He continues to have daily nausea without vomiting.  He describes having constant left upper quadrant abdominal pain which is pressure-like and sometimes feels as if a football is inside of him.  No specific food triggers, possibly worsens if he eats a larger meal. His left upper quadrant abdominal pain does not improve after passing a bowel movement.  He denies having classic heartburn but now describes having food regurgitation which occurs 3 to 4 days weekly for the past few months.  No dysphagia.  He remains on Meloxicam 7.5 mg twice daily for back pain and aspirin 81 mg daily.  He is on Pantoprazole 40 mg p.o. twice daily.  No weight loss.  No fever, sweats or chills.  He is passing normal formed brown bowel movement most days.  He reports seeing a small amount of bright red blood on the toilet tissue which occurs 3 days weekly on and off for  the past several years.  No associated anal rectal pain.  He underwent an EGD by Dr. Ardis Hughs 01/12/2015 which showed gastritis, biopsies were consistent with PPI inhibitor therapy.  He underwent a colonoscopy 11/08/2015 which was normal. No known family history of esophageal, gastric or colon cancer.  His labs show a normocytic anemia which occurred following his lumbar fusion surgery L-2 -L5 10/2019.  CBC Latest Ref Rng & Units 09/21/2020 08/25/2020 06/09/2020  WBC 3.4 - 10.8 x10E3/uL 6.6 6.2 6.5  Hemoglobin 13.0 - 17.7 g/dL 12.6(L) 12.5(L) 12.9(L)  Hematocrit 37.5 - 51.0 % 37.9 38.8(L) 38.7(L)  Platelets 150 - 450 x10E3/uL 260 231 235.0  MCV 81.   CMP Latest Ref Rng & Units 09/21/2020 08/25/2020 06/15/2020  Glucose 65 - 99 mg/dL 98 115(H) 115(H)  BUN 6 - 24 mg/dL _0 Creatinine 0.76 - 1.27 mg/dL 1.15 1.16 1.08  Sodium 134 - 144 mmol/L 144 138 144  Potassium 3.5 - 5.2 mmol/L 4.2 3.7 3.8  Chloride 96 - 106 mmol/L 102 101 102  CO2 20 - 29 mmol/L _1 Calcium 8.7 - 10.2 mg/dL 9.4 9.1 9.3  Total Protein 6.0 - 8.5 g/dL 6.5 - -  Total Bilirubin 0.0 - 1.2 mg/dL 0.7 - -  Alkaline Phos 44 - 121 IU/L 126(H) - -  AST 0 - 40 IU/L 31 - -  ALT 0 - 44 IU/L 35 - -    CTAP 06/21/2020:  1. No evidence of diverticulitis. 2. Short-segment jejunal intussusception in the LEFT upper quadrant. This is approximately 3  cm a slightly greater than 3 cm total length and is not associated with obstruction, persisting on delayed phase images. While this may represent a transient phenomenon, length at slightly greater than expected for transient phenomenon and LEFT-sided symptoms may warrant further investigation, suggest CT enterography as an initial means of further evaluation to assess for any signs of lead point. 3. Interval revision of spinal fusion now with extension into the S1 level and iliac bone on the LEFT and with placement of L5-S1 to iliac fusion on the RIGHT. Baseline changes of laminectomy  and bony fusion are similar to prior exams. 4. Small area of clustered nodules with a central 6 mm mean diameter area of nodularity, likely post infectious and suggested but not well evaluated on previous imaging. Non-contrast chest CT at 6-12 months is recommended. If the nodule is stable at time of repeat CT, then future CT at 18-24 months (from today's scan) is considered optional for low-risk patients, but is recommended for high-risk patients. This recommendation follows the consensus statement: Guidelines for Management of Incidental Pulmonary Nodules Detected on CT Images: From the Fleischner Society 2017; Radiology 2017; 284:228-243. 5. Signs of coronary artery calcification of RIGHT coronary circulation. 6. Small fat containing umbilical hernia. 7. Aortic atherosclerosis. Aortic Atherosclerosis   EGD 01/12/2015 by Dr. Ardis Hughs: There was mild to moderate pan-gastritis, non-specific. This was biopsied distally and sent to pathology. The examination was otherwise normal FINDINGS CONSISTENT WITH PROTON PUMP INHIBITOR THERAPY. - THERE IS NO EVIDENCE OF SIGNIFICANT INFLAMMATION, DYSPLASIA, OR MALIGNANCY.  Colonoscopy 11/08/2015 by Dr. Ardis Hughs: - The examined portion of the ileum was normal. - The entire examined colon is normal.  Biopsies were negative for microscopic colitis. - The examination was otherwise normal on direct and retroflexion views. - 10 year recall  Echo 11/25/2015: LVEF 60 to 65%   Current Outpatient Medications on File Prior to Visit  Medication Sig Dispense Refill  . acetaminophen (TYLENOL) 500 MG tablet Take 1,000 mg by mouth daily as needed for mild pain or headache.     [name] aspirin EC 81 MG tablet Take 81 mg by mouth at bedtime.    [name] atorvastatin (LIPITOR) 80 MG tablet TAKE 1 TABLET BY MOUTH EVERYDAY AT BEDTIME 90 tablet 0  . betamethasone dipropionate (DIPROLENE) 0.05 % cream APPLY TO AFFECTED AREA TWICE A DAY (Patient taking differently: Apply 1 application  topically 2 (two) times daily as needed (itching/rash).) 30 g 0  . cetirizine (ZYRTEC) 10 MG tablet Take 10 mg by mouth daily.    . diphenhydrAMINE (BENADRYL) 25 MG tablet Take 25 mg by mouth 2 (two) times daily as needed for itching.     [name] EPINEPHRINE 0.3 mg/0.3 mL IJ SOAJ injection INJECT 0.3 MLS (0.3 MG TOTAL) INTO THE MUSCLE AS NEEDED FOR ANAPHYLAXIS. 2 each 3  . fluticasone (FLONASE) 50 MCG/ACT nasal spray Place 2 sprays into both nostrils daily. 48 g 1  . hydrochlorothiazide (HYDRODIURIL) 50 MG tablet TAKE 1 TABLET BY MOUTH EVERY DAY (Patient taking differently: Take 50 mg by mouth daily.) 90 tablet 1  . HYDROcodone-acetaminophen (NORCO/VICODIN) 5-325 MG tablet Take 1 tablet by mouth every 6 (six) hours as needed for moderate pain. for pain    . ketoconazole (NIZORAL) 2 % shampoo Apply 1 application topically 3 (three) times a week.    . levothyroxine (SYNTHROID) 100 MCG tablet Take 1 tablet (100 mcg total) by mouth daily. 90 tablet 2  . meloxicam (MOBIC) 7.5 MG tablet Take 7.5 mg by mouth in  the morning and at bedtime.    . nitroGLYCERIN (NITROSTAT) 0.4 MG SL tablet PLACE 1 TABLET (0.4 MG TOTAL) UNDER THE TONGUE EVERY 5 (FIVE) MINUTES AS NEEDED FOR CHEST PAIN. 25 tablet 2  . ondansetron (ZOFRAN ODT) 4 MG disintegrating tablet Dissolve 1 tablet in mouth every 8 hours as needed for nausea/ vomiting 60 tablet 0  . promethazine (PHENERGAN) 25 MG tablet Take 0.5-1 tablets (12.5-25 mg total) by mouth 2 (two) times daily as needed for nausea or vomiting. 45 tablet 1  . amLODipine (NORVASC) 10 MG tablet Take 1 tablet (10 mg total) by mouth daily. 90 tablet 3  . PARoxetine (PAXIL) 20 MG tablet Take 1 tablet (20 mg total) by mouth daily. 90 tablet 1   No current facility-administered medications on file prior to visit.   Allergies  Allergen Reactions  . Alpha-Gal Rash and Other (See Comments)    SEVERE ALLERGY TO ANY MEAT OR MEAT DERIVED PRODUCTS FROM 4 LEGGED ANIMALS > > BEEF, PORK , GOATS, DEER,  ETC. < < RESULT OF BITE FROM LONE STAR TICK  . Beef (Bovine) Protein Rash and Other (See Comments)  . Beef-Derived Products Rash and Other (See Comments)  . Lambs Quarters Rash and Other (See Comments)  . Pork-Derived Products Rash and Other (See Comments)  . Prednisone Other (See Comments)    im depo medrol caused dizziness - pt passed out. Ask pt before giving. Pt can take oral prednisone   . Brilinta [Ticagrelor] Hives    Pt ate pork on the same day he took Brilinta before knowing he had alpha gal. May have been the alpha gal reaction, pt is unsure.   . Doxycycline Hives  . Gabapentin Swelling  . Lyrica [Pregabalin]     Dry mouth and felt raw  . Methylprednisolone Sodium Succ     im depo medrol caused dizziness - pt passed out. Ask pt before giving. Issue occurred when administered in the left arm   . Penicillins Hives    Did it involve swelling of the face/tongue/throat, SOB, or low BP? No Did it involve sudden or severe rash/hives, skin peeling, or any reaction on the inside of your mouth or nose? No Did you need to seek medical attention at a hospital or doctor's office? No When did it last happen?10 + years ago If all above answers are "NO", may proceed with cephalosporin use.   . Shellfish Allergy Hives  . Adhesive [Tape] Rash  . Fentanyl Rash    Reaction to adhesive, not the drug     Current Medications, Allergies, Past Medical History, Past Surgical History, Family History and Social History were reviewed in [employer] record.   Review of Systems:   Constitutional: Negative for fever, sweats, chills or weight loss.  Respiratory: Negative for shortness of breath.   Cardiovascular: + Leg swelling recently evaluated by his PCP. Negative for chest pain or palpitations.  Gastrointestinal: See HPI.  Musculoskeletal: + lower back pain.  Neurological: Negative for dizziness, headaches or paresthesias.   Physical Exam: BP (!) 144/66   Pulse 78    Ht _0  (1.778 m)   Wt 212 lb (96.2 kg)   BMI 30.42 kg/m  General: Well developed 60 year old male in no acute distress. Head: Normocephalic and atraumatic. Eyes: No scleral icterus. Conjunctiva pink . Ears: Normal auditory acuity. Mouth: Dentition intact. No ulcers or lesions.  Lungs: Clear throughout to auscultation. Heart: Regular rate and rhythm, no murmur. Abdomen: Soft. Mild  periumbilical distension with intact laparoscopic scars. LUQ tenderness without rebound or guarding.  No masses or hepatomegaly. Normal bowel sounds x 4 quadrants.  Rectal: Deferred.  Musculoskeletal: Symmetrical with no gross deformities. Extremities: No edema. Neurological: Alert oriented x 4. No focal deficits.  Psychological: Alert and cooperative. Normal mood and affect  Assessment and Recommendations:  68.  60 year old male with nausea, LUQ pain and regurgitation. Chronic NSAID use. CTAP  06/21/2020 showed evidence of short segment jejunal intussusception and subsequently underwent laparoscopic surgery by Dr. Leighton Ruff without evidence of small bowel intussusception or obvious pathology, his umbilical  hernia was repaired at that time. -EGD and colonoscopy benefits and risks discussed including risk with sedation, risk of bleeding, perforation and infection  -Avoid large meals -Pantoprazole 40 mg twice daily -Patient to call our office if symptoms worsen  2.  Normocytic anemia. -Check iron, iron saturation, TIBC, ferritin, B12 and folate levels -See plan in # 1  3. Rectal bleeding occurs with straining.  -Miralax bid -See plan in # 1  4.  CTAP identified small area of clustered nodules with a central 6 mm mean diameter area of nodularity just above the LEFT hemidiaphragm that may have been present as far back as 2016.  -Continue follow up with PCP   5. Elevated Alk Phos with normal AST/ALT levels  -Alk phos isoenzymes   Further recommendation to be determined after the above evaluation  completed

## 2020-10-12 ENCOUNTER — Other Ambulatory Visit (INDEPENDENT_AMBULATORY_CARE_PROVIDER_SITE_OTHER): Payer: Medicare Other

## 2020-10-12 ENCOUNTER — Ambulatory Visit (INDEPENDENT_AMBULATORY_CARE_PROVIDER_SITE_OTHER): Payer: Medicare Other | Admitting: Nurse Practitioner

## 2020-10-12 ENCOUNTER — Encounter: Payer: Self-pay | Admitting: Nurse Practitioner

## 2020-10-12 VITALS — BP 144/66 | HR 78 | Ht 70.0 in | Wt 212.0 lb

## 2020-10-12 DIAGNOSIS — R110 Nausea: Secondary | ICD-10-CM | POA: Diagnosis not present

## 2020-10-12 DIAGNOSIS — R1110 Vomiting, unspecified: Secondary | ICD-10-CM | POA: Diagnosis not present

## 2020-10-12 DIAGNOSIS — R6889 Other general symptoms and signs: Secondary | ICD-10-CM

## 2020-10-12 DIAGNOSIS — R1012 Left upper quadrant pain: Secondary | ICD-10-CM

## 2020-10-12 DIAGNOSIS — K625 Hemorrhage of anus and rectum: Secondary | ICD-10-CM

## 2020-10-12 DIAGNOSIS — K219 Gastro-esophageal reflux disease without esophagitis: Secondary | ICD-10-CM

## 2020-10-12 DIAGNOSIS — D649 Anemia, unspecified: Secondary | ICD-10-CM

## 2020-10-12 LAB — B12 AND FOLATE PANEL
Folate: 20.5 ng/mL (ref >5.9)
Vitamin B-12: 119 pg/mL — ABNORMAL LOW (ref 211–911)

## 2020-10-12 MED ORDER — SUPREP BOWEL PREP KIT 17.5-3.13-1.6 GM/177ML PO SOLN
1.0000 | ORAL | 0 refills | Status: DC
Start: 2020-10-12 — End: 2020-12-13

## 2020-10-12 MED ORDER — PANTOPRAZOLE SODIUM 40 MG PO TBEC: 40.0000 mg | DELAYED_RELEASE_TABLET | Freq: Two times a day (BID) | ORAL | 3 refills | Status: DC

## 2020-10-12 NOTE — Patient Instructions (Addendum)
If you are age 60 or younger, your body mass index should be between 19-25. Your Body mass index is 30.42 kg/m. If this is out of the aformentioned range listed, please consider follow up with your Primary Care Provider.   PROCEDURES:  You have been scheduled for an endoscopy and colonoscopy. Please follow the written instructions given to you at your visit today. Please pick up your prep supplies at the pharmacy within the next 1-3 days. If you use inhalers (even only as needed), please bring them with you on the day of your procedure.  LABS:  Lab work has been ordered for you today. Our lab is located in the basement. Press "B" on the elevator. The lab is located at the first door on the left as you exit the elevator.  HEALTHCARE LAWS AND MY CHART RESULTS: Due to recent changes in healthcare laws, you may see the results of your imaging and laboratory studies on MyChart before your provider has had a chance to review them.   We understand that in some cases there may be results that are confusing or concerning to you. Not all laboratory results come back in the same time frame and the provider may be waiting for multiple results in order to interpret others.  Please give [place] 48 hours in order for your provider to thoroughly review all the results before contacting the office for clarification of your results.   RECOMMENDATIONS:  Continue Pantoprazole 40 MG twice a day. Miralax. Dissolve one capful in 8 ounces of water and drink before bed. Call if rectal bleeding recurs.  It was great seeing you today! Thank you for entrusting me with your care and choosing [facility].  Noralyn Pick, CRNP

## 2020-10-12 NOTE — Progress Notes (Signed)
I agree with the above note, plan 

## 2020-10-13 LAB — IRON,TIBC AND FERRITIN PANEL
%SAT: 15 % (calc) — ABNORMAL LOW (ref 20–48)
Ferritin: 5 ng/mL — ABNORMAL LOW (ref 38–380)
Iron: 60 ug/dL (ref 50–180)
TIBC: 413 mcg/dL (calc) (ref 250–425)

## 2020-10-14 ENCOUNTER — Telehealth: Payer: Self-pay | Admitting: Nurse Practitioner

## 2020-10-14 NOTE — Telephone Encounter (Signed)
Spoke to patient to inform him that Jaclyn Shaggy has not reviewed his labs at this time. I let him know that he will be notified as soon as she reviews with recommendations. Patient voiced understanding.

## 2020-10-14 NOTE — Telephone Encounter (Signed)
Inbound call from patient requesting a call back from a nurse please in regards to lab results.

## 2020-10-15 LAB — ALKALINE PHOSPHATASE, ISOENZYMES
Alkaline Phosphatase: 125 IU/L — ABNORMAL HIGH (ref 44–121)
BONE FRACTION: 38 % (ref 12–68)
INTESTINAL FRAC.: 0 % (ref 0–18)
LIVER FRACTION: 62 % (ref 13–88)

## 2020-10-25 ENCOUNTER — Other Ambulatory Visit: Payer: Self-pay

## 2020-10-25 ENCOUNTER — Telehealth: Payer: Self-pay

## 2020-10-25 DIAGNOSIS — R110 Nausea: Secondary | ICD-10-CM

## 2020-10-25 DIAGNOSIS — D649 Anemia, unspecified: Secondary | ICD-10-CM

## 2020-10-25 DIAGNOSIS — R1012 Left upper quadrant pain: Secondary | ICD-10-CM

## 2020-10-25 NOTE — Telephone Encounter (Signed)
GI has started him on ferrous sulfate to take for iron replacement already.  Please make sure that he picks that prescription up Pertaining to his B12, please set him up with nurse to inject B12 weekly for the next 4 weeks then transition over to once monthly with plans to see me in 2 months for B12 lab.

## 2020-10-26 ENCOUNTER — Encounter: Payer: Medicare Other | Admitting: Gastroenterology

## 2020-10-26 NOTE — Telephone Encounter (Signed)
Patient aware and verbalizes understanding- appointment scheduled.  

## 2020-10-28 ENCOUNTER — Other Ambulatory Visit: Payer: Self-pay

## 2020-10-28 ENCOUNTER — Other Ambulatory Visit: Payer: Medicare Other

## 2020-10-28 ENCOUNTER — Ambulatory Visit (INDEPENDENT_AMBULATORY_CARE_PROVIDER_SITE_OTHER): Payer: Medicare Other

## 2020-10-28 DIAGNOSIS — R110 Nausea: Secondary | ICD-10-CM

## 2020-10-28 DIAGNOSIS — E538 Deficiency of other specified B group vitamins: Secondary | ICD-10-CM

## 2020-10-28 DIAGNOSIS — D649 Anemia, unspecified: Secondary | ICD-10-CM

## 2020-10-28 DIAGNOSIS — R1012 Left upper quadrant pain: Secondary | ICD-10-CM

## 2020-10-28 DIAGNOSIS — E039 Hypothyroidism, unspecified: Secondary | ICD-10-CM

## 2020-10-28 MED ORDER — CYANOCOBALAMIN 1000 MCG/ML IJ SOLN
1000.0000 ug | INTRAMUSCULAR | Status: DC
  Administered 2020-10-28 – 2021-07-21 (×5): 1000 ug via INTRAMUSCULAR

## 2020-10-28 NOTE — Addendum Note (Signed)
Addended by: Reather Converse on: 10/28/2020 09:26 AM   Modules accepted: Orders

## 2020-10-29 LAB — THYROID PANEL WITH TSH
Free Thyroxine Index: 3.1 (ref 1.2–4.9)
T3 Uptake Ratio: 26 % (ref 24–39)
T4, Total: 11.9 ug/dL (ref 4.5–12.0)
TSH: 4.19 u[IU]/mL (ref 0.450–4.500)

## 2020-10-30 LAB — INTRINSIC FACTOR ANTIBODIES: Intrinsic Factor Abs, Serum: 15.3 AU/mL — ABNORMAL HIGH (ref 0.0–1.1)

## 2020-10-31 ENCOUNTER — Encounter: Payer: Self-pay | Admitting: Family Medicine

## 2020-10-31 DIAGNOSIS — D510 Vitamin B12 deficiency anemia due to intrinsic factor deficiency: Secondary | ICD-10-CM | POA: Insufficient documentation

## 2020-11-04 ENCOUNTER — Other Ambulatory Visit: Payer: Self-pay

## 2020-11-04 ENCOUNTER — Ambulatory Visit (INDEPENDENT_AMBULATORY_CARE_PROVIDER_SITE_OTHER): Payer: Medicare Other | Admitting: Family Medicine

## 2020-11-04 DIAGNOSIS — E538 Deficiency of other specified B group vitamins: Secondary | ICD-10-CM | POA: Diagnosis not present

## 2020-11-08 ENCOUNTER — Other Ambulatory Visit: Payer: Self-pay | Admitting: Family Medicine

## 2020-11-08 DIAGNOSIS — F411 Generalized anxiety disorder: Secondary | ICD-10-CM

## 2020-11-11 ENCOUNTER — Other Ambulatory Visit: Payer: Self-pay

## 2020-11-11 ENCOUNTER — Ambulatory Visit (INDEPENDENT_AMBULATORY_CARE_PROVIDER_SITE_OTHER): Payer: Medicare Other | Admitting: *Deleted

## 2020-11-11 DIAGNOSIS — F411 Generalized anxiety disorder: Secondary | ICD-10-CM

## 2020-11-11 DIAGNOSIS — E538 Deficiency of other specified B group vitamins: Secondary | ICD-10-CM

## 2020-11-11 MED ORDER — PAROXETINE HCL 20 MG PO TABS: 20.0000 mg | ORAL_TABLET | Freq: Every day | ORAL | 0 refills | Status: DC

## 2020-11-11 NOTE — Progress Notes (Signed)
Tolerated B12 well  °

## 2020-11-21 ENCOUNTER — Other Ambulatory Visit: Payer: Self-pay

## 2020-11-21 ENCOUNTER — Ambulatory Visit (INDEPENDENT_AMBULATORY_CARE_PROVIDER_SITE_OTHER): Payer: Medicare Other

## 2020-11-21 DIAGNOSIS — E538 Deficiency of other specified B group vitamins: Secondary | ICD-10-CM

## 2020-11-21 MED ORDER — CYANOCOBALAMIN 1000 MCG/ML IJ SOLN
1000.0000 ug | INTRAMUSCULAR | Status: DC
  Administered 2020-12-20 – 2023-11-29 (×35): 1000 ug via INTRAMUSCULAR

## 2020-11-21 NOTE — Progress Notes (Signed)
B12 injection given to patient and tolerated well.  

## 2020-12-13 ENCOUNTER — Other Ambulatory Visit: Payer: Self-pay

## 2020-12-13 ENCOUNTER — Ambulatory Visit (AMBULATORY_SURGERY_CENTER): Payer: Medicare Other | Admitting: Gastroenterology

## 2020-12-13 ENCOUNTER — Encounter: Payer: Self-pay | Admitting: Gastroenterology

## 2020-12-13 ENCOUNTER — Other Ambulatory Visit: Payer: Self-pay | Admitting: Gastroenterology

## 2020-12-13 VITALS — BP 126/76 | HR 60 | Temp 98.4°F | Resp 12 | Ht 70.0 in | Wt 212.0 lb

## 2020-12-13 DIAGNOSIS — R1012 Left upper quadrant pain: Secondary | ICD-10-CM

## 2020-12-13 DIAGNOSIS — K2970 Gastritis, unspecified, without bleeding: Secondary | ICD-10-CM | POA: Diagnosis not present

## 2020-12-13 DIAGNOSIS — K625 Hemorrhage of anus and rectum: Secondary | ICD-10-CM

## 2020-12-13 DIAGNOSIS — K2950 Unspecified chronic gastritis without bleeding: Secondary | ICD-10-CM | POA: Diagnosis not present

## 2020-12-13 DIAGNOSIS — K648 Other hemorrhoids: Secondary | ICD-10-CM | POA: Diagnosis not present

## 2020-12-13 MED ORDER — SODIUM CHLORIDE 0.9 % IV SOLN: 500.0000 mL | Freq: Once | INTRAVENOUS | Status: DC

## 2020-12-13 NOTE — Op Note (Signed)
[place] Patient Name: Jeffrey Hooper Procedure Date: 12/13/2020 1:26 PM MRN: [id number] Endoscopist: Milus Banister , MD Age: 60 Referring MD:  Date of Birth: 05/11/1961 Gender: Male Account #: [id number] Procedure:                Colonoscopy Indications:              Rectal bleeding Medicines:                Monitored Anesthesia Care Procedure:                Pre-Anesthesia Assessment:                           - Prior to the procedure, a History and Physical                            was performed, and patient medications and                            allergies were reviewed. The patient's tolerance of                            previous anesthesia was also reviewed. The risks                            and benefits of the procedure and the sedation                            options and risks were discussed with the patient.                            All questions were answered, and informed consent                            was obtained. Prior Anticoagulants: The patient has                            taken no previous anticoagulant or antiplatelet                            agents. ASA Grade Assessment: III - A patient with                            severe systemic disease. After reviewing the risks                            and benefits, the patient was deemed in                            satisfactory condition to undergo the procedure.                           After obtaining informed consent, the colonoscope  was passed under direct vision. Throughout the                            procedure, the patient's blood pressure, pulse, and                            oxygen saturations were monitored continuously. The                            Olympus CF-HQ190L [id number]) Colonoscope was                            introduced through the anus and advanced to the the                            terminal ileum. The colonoscopy was performed                             without difficulty. The patient tolerated the                            procedure well. The quality of the bowel                            preparation was good. The terminal ileum, ileocecal                            valve, appendiceal orifice, and rectum were                            photographed. Scope In: 1:33:11 PM Scope Out: 1:43:44 PM Scope Withdrawal Time: 0 hours 8 minutes 54 seconds  Total Procedure Duration: 0 hours 10 minutes 33 seconds  Findings:                 The terminal ileum appeared normal.                           Internal hemorrhoids were found. The hemorrhoids                            were small.                           The exam was otherwise without abnormality on                            direct and retroflexion views. Complications:            No immediate complications. Estimated blood loss:                            None. Estimated Blood Loss:     Estimated blood loss: none. Impression:               - The examined portion of the ileum was normal.                           -  Internal hemorrhoids.                           - The examination was otherwise normal on direct                            and retroflexion views.                           - No polyps or cancers. Recommendation:           - Repeat colonoscopy in 10 years for screening.                           - EGD now. Milus Banister, MD 12/13/2020 1:45:51 PM This report has been signed electronically.

## 2020-12-13 NOTE — Op Note (Signed)
[place] Patient Name: Jeffrey Hooper Procedure Date: 12/13/2020 1:25 PM MRN: [id number] Endoscopist: Milus Banister , MD Age: 60 Referring MD:  Date of Birth: [birth year] Gender: Male Account #: [id number] Procedure:                Upper GI endoscopy Indications:              Abdominal pain in the left upper quadrant; anemia,                            low B12, elevated IF antibiodies (likely pernicious                            anemia) Medicines:                Monitored Anesthesia Care Procedure:                Pre-Anesthesia Assessment:                           - Prior to the procedure, a History and Physical                            was performed, and patient medications and                            allergies were reviewed. The patient's tolerance of                            previous anesthesia was also reviewed. The risks                            and benefits of the procedure and the sedation                            options and risks were discussed with the patient.                            All questions were answered, and informed consent                            was obtained. Prior Anticoagulants: The patient has                            taken no previous anticoagulant or antiplatelet                            agents. ASA Grade Assessment: III - A patient with                            severe systemic disease. After reviewing the risks                            and benefits, the patient was deemed in  satisfactory condition to undergo the procedure.                           After obtaining informed consent, the endoscope was                            passed under direct vision. Throughout the                            procedure, the patient's blood pressure, pulse, and                            oxygen saturations were monitored continuously. The                            Endoscope was introduced through the  mouth, and                            advanced to the second part of duodenum. The upper                            GI endoscopy was accomplished without difficulty.                            The patient tolerated the procedure well. Scope In: Scope Out: Findings:                 Mild inflammation characterized by erythema,                            friability and granularity was found in the entire                            examined stomach. Biopsies were taken with a cold                            forceps for histology.                           The exam was otherwise without abnormality. Complications:            No immediate complications. Estimated blood loss:                            None. Estimated Blood Loss:     Estimated blood loss: none. Impression:               - Mild to moderate, non-specific gastritis.                            Biopsied.                           - The examination was otherwise normal. Recommendation:           - Patient has a contact number available for  emergencies. The signs and symptoms of potential                            delayed complications were discussed with the                            patient. Return to normal activities tomorrow.                            Written discharge instructions were provided to the                            patient.                           - Resume previous diet.                           - Continue present medications.                           - Await pathology results. Milus Banister, MD 12/13/2020 1:55:49 PM This report has been signed electronically.

## 2020-12-13 NOTE — Progress Notes (Signed)
PT taken to PACU. Monitors in place. VSS. Report given to RN. 

## 2020-12-13 NOTE — Progress Notes (Signed)
Called to room to assist during endoscopic procedure.  Patient ID and intended procedure confirmed with present staff. Received instructions for my participation in the procedure from the performing physician.  

## 2020-12-13 NOTE — Patient Instructions (Signed)
Discharge instructions given. Handouts on Hemorrhoids and Gastritis. Resume previous medications. YOU HAD AN ENDOSCOPIC PROCEDURE TODAY AT [facility]:   Refer to the procedure report that was given to you for any specific questions about what was found during the examination.  If the procedure report does not answer your questions, please call your gastroenterologist to clarify.  If you requested that your care partner not be given the details of your procedure findings, then the procedure report has been included in a sealed envelope for you to review at your convenience later.  YOU SHOULD EXPECT: Some feelings of bloating in the abdomen. Passage of more gas than usual.  Walking can help get rid of the air that was put into your GI tract during the procedure and reduce the bloating. If you had a lower endoscopy (such as a colonoscopy or flexible sigmoidoscopy) you may notice spotting of blood in your stool or on the toilet paper. If you underwent a bowel prep for your procedure, you may not have a normal bowel movement for a few days.  Please Note:  You might notice some irritation and congestion in your nose or some drainage.  This is from the oxygen used during your procedure.  There is no need for concern and it should clear up in a day or so.  SYMPTOMS TO REPORT IMMEDIATELY:   Following lower endoscopy (colonoscopy or flexible sigmoidoscopy):  Excessive amounts of blood in the stool  Significant tenderness or worsening of abdominal pains  Swelling of the abdomen that is new, acute  Fever of 100F or higher   Following upper endoscopy (EGD)  Vomiting of blood or coffee ground material  New chest pain or pain under the shoulder blades  Painful or persistently difficult swallowing  New shortness of breath  Fever of 100F or higher  Black, tarry-looking stools  For urgent or emergent issues, a gastroenterologist can be reached at any hour by calling [phone]. Do  not use MyChart messaging for urgent concerns.    DIET:  We do recommend a small meal at first, but then you may proceed to your regular diet.  Drink plenty of fluids but you should avoid alcoholic beverages for 24 hours.  ACTIVITY:  You should plan to take it easy for the rest of today and you should NOT DRIVE or use heavy machinery until tomorrow (because of the sedation medicines used during the test).    FOLLOW UP: Our staff will call the number listed on your records 48-72 hours following your procedure to check on you and address any questions or concerns that you may have regarding the information given to you following your procedure. If we do not reach you, we will leave a message.  We will attempt to reach you two times.  During this call, we will ask if you have developed any symptoms of COVID 19. If you develop any symptoms (ie: fever, flu-like symptoms, shortness of breath, cough etc.) before then, please call [phone].  If you test positive for Covid 19 in the 2 weeks post procedure, please call and report this information to [place].    If any biopsies were taken you will be contacted by phone or by letter within the next 1-3 weeks.  Please call us at [phone] if you have not heard about the biopsies in 3 weeks.    SIGNATURES/CONFIDENTIALITY: You and/or your care partner have signed paperwork which will be entered into your electronic medical record.  These  signatures attest to the fact that that the information above on your After Visit Summary has been reviewed and is understood.  Full responsibility of the confidentiality of this discharge information lies with you and/or your care-partner.

## 2020-12-13 NOTE — Progress Notes (Signed)
VS by CW  I have reviewed the patient's medical history in detail and updated the computerized patient record.  

## 2020-12-15 ENCOUNTER — Telehealth: Payer: Self-pay

## 2020-12-15 NOTE — Telephone Encounter (Signed)
  Follow up Call-  Call back number 12/13/2020  Post procedure Call Back phone  # [phone]  Permission to leave phone message Yes  Some recent data might be hidden     Patient questions:  Do you have a fever, pain , or abdominal swelling? No. Pain Score  0 *  Have you tolerated food without any problems? Yes.    Have you been able to return to your normal activities? Yes.    Do you have any questions about your discharge instructions: Diet   No. Medications  No. Follow up visit  No.  Do you have questions or concerns about your Care? No.  Actions: * If pain score is 4 or above: No action needed, pain <4.

## 2020-12-17 ENCOUNTER — Other Ambulatory Visit: Payer: Self-pay | Admitting: Family Medicine

## 2020-12-17 DIAGNOSIS — E039 Hypothyroidism, unspecified: Secondary | ICD-10-CM

## 2020-12-18 ENCOUNTER — Other Ambulatory Visit: Payer: Self-pay | Admitting: Family Medicine

## 2020-12-18 DIAGNOSIS — E782 Mixed hyperlipidemia: Secondary | ICD-10-CM

## 2020-12-20 ENCOUNTER — Ambulatory Visit (INDEPENDENT_AMBULATORY_CARE_PROVIDER_SITE_OTHER): Payer: Medicare Other | Admitting: Family Medicine

## 2020-12-20 ENCOUNTER — Encounter: Payer: Self-pay | Admitting: Family Medicine

## 2020-12-20 ENCOUNTER — Other Ambulatory Visit: Payer: Self-pay

## 2020-12-20 VITALS — BP 124/77 | HR 54 | Temp 98.0°F | Ht 70.0 in | Wt 210.0 lb

## 2020-12-20 DIAGNOSIS — K5900 Constipation, unspecified: Secondary | ICD-10-CM

## 2020-12-20 DIAGNOSIS — R790 Abnormal level of blood mineral: Secondary | ICD-10-CM

## 2020-12-20 DIAGNOSIS — T781XXA Other adverse food reactions, not elsewhere classified, initial encounter: Secondary | ICD-10-CM

## 2020-12-20 DIAGNOSIS — M5416 Radiculopathy, lumbar region: Secondary | ICD-10-CM | POA: Diagnosis not present

## 2020-12-20 DIAGNOSIS — R1012 Left upper quadrant pain: Secondary | ICD-10-CM

## 2020-12-20 DIAGNOSIS — R600 Localized edema: Secondary | ICD-10-CM

## 2020-12-20 DIAGNOSIS — E538 Deficiency of other specified B group vitamins: Secondary | ICD-10-CM

## 2020-12-20 MED ORDER — TRULANCE 3 MG PO TABS: 1.0000 | ORAL_TABLET | Freq: Every day | ORAL | 0 refills | Status: DC

## 2020-12-20 NOTE — Patient Instructions (Signed)
Hold Meloxicam for a few days and see if stomach feels any better.  Your EGD did show some "gastritis" =  Inflammation in the stomach.  Meloxicam can cause this.  It can also worsen swelling in your legs.  If no improvement in swelling in legs after a few days, can try 1/2 tablet of Amlodipine to see if swelling gets better but watch blood pressure!  Needs to stay below 150/90.  Let me know what works and we will adjust meds   I'll look into the Atorvastatin

## 2020-12-20 NOTE — Progress Notes (Signed)
Subjective: CC: Follow-up abdominal pain, edema PCP: Janora Norlander, DO BPZ:WCHEN D Yang is a 60 y.o. male presenting to clinic today for:  1.  Left upper quadrant abdominal pain Patient status post EGD and colonoscopy.  Colonoscopy was fairly unremarkable except for some hemorrhoids.  His EGD showed mild to moderate gastritis but no discrete ulcerations.  He notes that he was not contacted about this but was able to visualize these reports online.  He continues to take his PPI therapy but admits he continues to take Mobic twice daily as well.  He does report occasional nausea but this also seem to be getting better.  No overt blood noted in stool.  He does report constipation and occasional rectal pain.  He thinks he will have to get the hemorrhoids banded.  He has been using MiraLAX but notes that this has not been especially helpful.  2.  Edema Patient with ongoing ankle edema.  He occasionally wears his compression hose.  He does watch his salt.  He is treated with amlodipine 10 mg daily for blood pressure.  3.  B12 deficiency, low ferritin level Patient is compliant with B12 injections.  Due to have next B12 injection on Thursday.  He is also taking ferrous sulfate as directed.  Energy has been fair.   ROS: Per HPI  Allergies  Allergen Reactions  . Alpha-Gal Rash and Other (See Comments)    SEVERE ALLERGY TO ANY MEAT OR MEAT DERIVED PRODUCTS FROM 4 LEGGED ANIMALS > > BEEF, PORK , GOATS, DEER, ETC. < < RESULT OF BITE FROM LONE STAR TICK  . Beef (Bovine) Protein Rash and Other (See Comments)  . Beef-Derived Products Rash and Other (See Comments)  . Lambs Quarters Rash and Other (See Comments)  . Pork-Derived Products Rash and Other (See Comments)  . Prednisone Other (See Comments)    im depo medrol caused dizziness - pt passed out. Ask pt before giving. Pt can take oral prednisone   . Brilinta [Ticagrelor] Hives    Pt ate pork on the same day he took Brilinta before  knowing he had alpha gal. May have been the alpha gal reaction, pt is unsure.   . Doxycycline Hives  . Gabapentin Swelling  . Lyrica [Pregabalin]     Dry mouth and felt raw  . Methylprednisolone Sodium Succ     im depo medrol caused dizziness - pt passed out. Ask pt before giving. Issue occurred when administered in the left arm   . Penicillins Hives    Did it involve swelling of the face/tongue/throat, SOB, or low BP? No Did it involve sudden or severe rash/hives, skin peeling, or any reaction on the inside of your mouth or nose? No Did you need to seek medical attention at a hospital or doctor's office? No When did it last happen?10 + years ago If all above answers are "NO", may proceed with cephalosporin use.   . Shellfish Allergy Hives  . Adhesive [Tape] Rash  . Fentanyl Rash    Reaction to adhesive, not the drug    Past Medical History:  Diagnosis Date  . Allergy   . Allergy to alpha-gal   . Anxiety   . Arthritis   . CAD (coronary artery disease), native coronary artery 12/03/2015  . Coronary artery disease   . Dysrhythmia    "irregular and PAC'S"  . GERD (gastroesophageal reflux disease)   . Head injury with loss of consciousness ([place])    back in the 1980's  .  History of kidney stones   . Hyperlipidemia   . Hypertension   . Hypothyroid   . Multiple benign nevi   . Neuromuscular disorder ([place]) 2011   LEFT RADIAL NERVE SURGERY R/T TRAUMA  . Pneumonia 2014, 2017  . Prostatism   . Pulmonary nodule   . Spinal cord stimulator status    told to bring remote to surgery    Current Outpatient Medications:  .  acetaminophen (TYLENOL) 500 MG tablet, Take 1,000 mg by mouth daily as needed for mild pain or headache. , Disp: , Rfl:  .  aspirin EC 81 MG tablet, Take 81 mg by mouth at bedtime., Disp: , Rfl:  .  atorvastatin (LIPITOR) 80 MG tablet, TAKE 1 TABLET BY MOUTH EVERYDAY AT BEDTIME, Disp: 90 tablet, Rfl: 0 .  betamethasone dipropionate (DIPROLENE) 0.05 % cream,  APPLY TO AFFECTED AREA TWICE A DAY (Patient taking differently: Apply 1 application topically 2 (two) times daily as needed (itching/rash).), Disp: 30 g, Rfl: 0 .  cetirizine (ZYRTEC) 10 MG tablet, Take 10 mg by mouth daily., Disp: , Rfl:  .  diphenhydrAMINE (BENADRYL) 25 MG tablet, Take 25 mg by mouth 2 (two) times daily as needed for itching. , Disp: , Rfl:  .  EPINEPHRINE 0.3 mg/0.3 mL IJ SOAJ injection, INJECT 0.3 MLS (0.3 MG TOTAL) INTO THE MUSCLE AS NEEDED FOR ANAPHYLAXIS., Disp: 2 each, Rfl: 3 .  fluticasone (FLONASE) 50 MCG/ACT nasal spray, Place 2 sprays into both nostrils daily., Disp: 48 g, Rfl: 1 .  hydrochlorothiazide (HYDRODIURIL) 50 MG tablet, TAKE 1 TABLET BY MOUTH EVERY DAY (Patient taking differently: Take 50 mg by mouth daily.), Disp: 90 tablet, Rfl: 1 .  HYDROcodone-acetaminophen (NORCO/VICODIN) 5-325 MG tablet, Take 1 tablet by mouth every 6 (six) hours as needed for moderate pain. for pain, Disp: , Rfl:  .  ketoconazole (NIZORAL) 2 % shampoo, Apply 1 application topically 3 (three) times a week., Disp: , Rfl:  .  levothyroxine (SYNTHROID) 100 MCG tablet, TAKE 1 TABLET BY MOUTH EVERY DAY, Disp: 90 tablet, Rfl: 2 .  meloxicam (MOBIC) 7.5 MG tablet, Take 7.5 mg by mouth in the morning and at bedtime., Disp: , Rfl:  .  nitroGLYCERIN (NITROSTAT) 0.4 MG SL tablet, PLACE 1 TABLET (0.4 MG TOTAL) UNDER THE TONGUE EVERY 5 (FIVE) MINUTES AS NEEDED FOR CHEST PAIN., Disp: 25 tablet, Rfl: 2 .  ondansetron (ZOFRAN ODT) 4 MG disintegrating tablet, Dissolve 1 tablet in mouth every 8 hours as needed for nausea/ vomiting, Disp: 60 tablet, Rfl: 0 .  pantoprazole (PROTONIX) 40 MG tablet, Take 1 tablet (40 mg total) by mouth 2 (two) times daily., Disp: 180 tablet, Rfl: 3 .  PARoxetine (PAXIL) 20 MG tablet, Take 1 tablet (20 mg total) by mouth daily., Disp: 90 tablet, Rfl: 0 .  promethazine (PHENERGAN) 25 MG tablet, Take 0.5-1 tablets (12.5-25 mg total) by mouth 2 (two) times daily as needed for nausea  or vomiting., Disp: 45 tablet, Rfl: 1 .  amLODipine (NORVASC) 10 MG tablet, Take 1 tablet (10 mg total) by mouth daily., Disp: 90 tablet, Rfl: 3  Current Facility-Administered Medications:  .  cyanocobalamin ((VITAMIN B-12)) injection 1,000 mcg, 1,000 mcg, Intramuscular, Weekly, Olon Russ M, DO, 1,000 mcg at 11/21/20 1059 .  cyanocobalamin ((VITAMIN B-12)) injection 1,000 mcg, 1,000 mcg, Intramuscular, Q30 days, Ronnie Doss M, DO Social History   Socioeconomic History  . Marital status: Married    Spouse name: Not on file  . Number of children: 3  .  Years of education: Not on file  . Highest education level: Not on file  Occupational History  . Occupation: [employer]  Tobacco Use  . Smoking status: Former Smoker    Packs/day: 1.00    Years: 20.00    Pack years: 20.00    Types: Cigarettes    Quit date: 11/28/2006    Years since quitting: 14.0  . Smokeless tobacco: Never Used  Vaping Use  . Vaping Use: Never used  Substance and Sexual Activity  . Alcohol use: Not Currently    Alcohol/week: 0.0 standard drinks    Comment: rarely  . Drug use: No  . Sexual activity: Yes  Other Topics Concern  . Not on file  Social History Narrative   Lives at home with wife, new 46 year old adopted boy.     Social Determinants of Health   Financial Resource Strain: Not on file  Food Insecurity: Not on file  Transportation Needs: Not on file  Physical Activity: Not on file  Stress: Not on file  Social Connections: Not on file  Intimate Partner Violence: Not on file   Family History  Problem Relation Age of Onset  . Heart failure Mother 80       Died age 77  . Congestive Heart Failure Mother   . Heart attack Father 68       Died with MI  . Heart attack Paternal Grandmother   . Heart attack Paternal Grandfather   . Colon cancer Neg Hx     Objective: Office vital signs reviewed. BP 124/77   Pulse (!) 54   Temp 98 F (36.7 C)   Ht 5\' 10"  (1.778 m)    Wt 210 lb (95.3 kg)   SpO2 99%   BMI 30.13 kg/m   Physical Examination:  General: Awake, alert, nontoxic, No acute distress HEENT: Normal; sclera white Cardio: regular rate and rhythm, S1S2 heard, no murmurs appreciated Pulm: clear to auscultation bilaterally, no wheezes, rhonchi or rales; normal work of breathing on room air Extremities: warm, well perfused, 1+ pitting edema to ankles, no cyanosis or clubbing; +2 pulses bilaterally MSK: Ambulating with use of cane  Assessment/ Plan: 60 y.o. male   B12 deficiency - Plan: Vitamin B12, CBC  Low ferritin - Plan: Ferritin  Allergic reaction to alpha-gal - Plan: Alpha-Gal Panel  Lumbar radiculopathy, chronic  Lower extremity edema  Constipation, unspecified constipation type - Plan: Plecanatide (TRULANCE) 3 MG TABS  LUQ pain  Check B12 level, CBC, ferritin  He wished to have repeat alpha gal panel  Ongoing low back pain that is probably exacerbated by constipation Trial of Trulance.  2 weeks worth of samples provided.  He will contact me if this is helpful  Left upper quadrant pain may be related to ongoing use of Mobic.  Advised to hold off on Mobic for a week or so to see if perhaps this might improve some of his symptoms.  This may improve edema as well  With regards to his lower extremity edema, possibly due to Mobic as above versus use of Norvasc versus combination of both.  If no significant improvement in ankle edema with discontinuation of Mobic could consider half dose of amlodipine with close monitoring of blood pressures.  If blood pressures not at goal but edema improving, could consider Lotrel instead.   No orders of the defined types were placed in this encounter.  No orders of the defined types were placed in this encounter.  Janora Norlander, DO [facility] [phone]

## 2020-12-21 LAB — FERRITIN: Ferritin: 15 ng/mL — ABNORMAL LOW (ref 30–400)

## 2020-12-22 ENCOUNTER — Ambulatory Visit: Payer: Medicare Other

## 2020-12-23 ENCOUNTER — Encounter: Payer: Self-pay | Admitting: Gastroenterology

## 2020-12-26 LAB — VITAMIN B12: Vitamin B-12: 363 pg/mL (ref 232–1245)

## 2020-12-26 LAB — ALPHA-GAL PANEL
Allergen Lamb IgE: 0.4 kU/L — AB
Beef IgE: 0.52 kU/L — AB
IgE (Immunoglobulin E), Serum: 69 IU/mL (ref 6–495)
O215-IgE Alpha-Gal: 1.01 kU/L — AB
Pork IgE: 0.3 kU/L — AB

## 2020-12-26 LAB — CBC
Hematocrit: 40.9 % (ref 37.5–51.0)
Hemoglobin: 13.3 g/dL (ref 13.0–17.7)
MCH: 28.1 pg (ref 26.6–33.0)
MCHC: 32.5 g/dL (ref 31.5–35.7)
MCV: 87 fL (ref 79–97)
Platelets: 241 10*3/uL (ref 150–450)
RBC: 4.73 x10E6/uL (ref 4.14–5.80)
RDW: 15.3 % (ref 11.6–15.4)
WBC: 6.5 10*3/uL (ref 3.4–10.8)

## 2021-01-06 ENCOUNTER — Ambulatory Visit (INDEPENDENT_AMBULATORY_CARE_PROVIDER_SITE_OTHER): Payer: Medicare Other | Admitting: Internal Medicine

## 2021-01-06 ENCOUNTER — Encounter: Payer: Self-pay | Admitting: Internal Medicine

## 2021-01-06 VITALS — BP 130/70 | HR 68 | Ht 70.0 in | Wt 210.8 lb

## 2021-01-06 DIAGNOSIS — K641 Second degree hemorrhoids: Secondary | ICD-10-CM

## 2021-01-06 DIAGNOSIS — L290 Pruritus ani: Secondary | ICD-10-CM

## 2021-01-06 NOTE — Progress Notes (Deleted)
Jeffrey Hooper 60 y.o. 07-Dec-1960 [id number]  Assessment & Plan:      Subjective:   Chief Complaint:  HPI  Allergies  Allergen Reactions  . Alpha-Gal Rash and Other (See Comments)    SEVERE ALLERGY TO ANY MEAT OR MEAT DERIVED PRODUCTS FROM 4 LEGGED ANIMALS > > BEEF, PORK , GOATS, DEER, ETC. < < RESULT OF BITE FROM LONE STAR TICK  . Beef (Bovine) Protein Rash and Other (See Comments)  . Beef-Derived Products Rash and Other (See Comments)  . Lambs Quarters Rash and Other (See Comments)  . Pork-Derived Products Rash and Other (See Comments)  . Prednisone Other (See Comments)    im depo medrol caused dizziness - pt passed out. Ask pt before giving. Pt can take oral prednisone   . Brilinta [Ticagrelor] Hives    Pt ate pork on the same day he took Brilinta before knowing he had alpha gal. May have been the alpha gal reaction, pt is unsure.   . Doxycycline Hives  . Gabapentin Swelling  . Lyrica [Pregabalin]     Dry mouth and felt raw  . Methylprednisolone Sodium Succ     im depo medrol caused dizziness - pt passed out. Ask pt before giving. Issue occurred when administered in the left arm   . Penicillins Hives    Did it involve swelling of the face/tongue/throat, SOB, or low BP? No Did it involve sudden or severe rash/hives, skin peeling, or any reaction on the inside of your mouth or nose? No Did you need to seek medical attention at a hospital or doctor's office? No When did it last happen?10 + years ago If all above answers are "NO", may proceed with cephalosporin use.   . Shellfish Allergy Hives  . Adhesive [Tape] Rash  . Fentanyl Rash    Reaction to adhesive, not the drug    Current Meds  Medication Sig  . acetaminophen (TYLENOL) 500 MG tablet Take 1,000 mg by mouth daily as needed for mild pain or headache.   [name] amLODipine (NORVASC) 10 MG tablet Take 1 tablet (10 mg total) by mouth daily.  [name] aspirin EC 81 MG tablet Take 81 mg by mouth at bedtime.  [name]  atorvastatin (LIPITOR) 80 MG tablet TAKE 1 TABLET BY MOUTH EVERYDAY AT BEDTIME  . betamethasone dipropionate (DIPROLENE) 0.05 % cream APPLY TO AFFECTED AREA TWICE A DAY (Patient taking differently: Apply 1 application topically 2 (two) times daily as needed (itching/rash).)  . cetirizine (ZYRTEC) 10 MG tablet Take 10 mg by mouth daily.  . diphenhydrAMINE (BENADRYL) 25 MG tablet Take 25 mg by mouth 2 (two) times daily as needed for itching.   [name] EPINEPHRINE 0.3 mg/0.3 mL IJ SOAJ injection INJECT 0.3 MLS (0.3 MG TOTAL) INTO THE MUSCLE AS NEEDED FOR ANAPHYLAXIS.  . fluticasone (FLONASE) 50 MCG/ACT nasal spray Place 2 sprays into both nostrils daily.  . hydrochlorothiazide (HYDRODIURIL) 50 MG tablet TAKE 1 TABLET BY MOUTH EVERY DAY (Patient taking differently: Take 50 mg by mouth daily.)  . HYDROcodone-acetaminophen (NORCO/VICODIN) 5-325 MG tablet Take 1 tablet by mouth every 6 (six) hours as needed for moderate pain. for pain  . ketoconazole (NIZORAL) 2 % shampoo Apply 1 application topically 3 (three) times a week.  . levothyroxine (SYNTHROID) 100 MCG tablet TAKE 1 TABLET BY MOUTH EVERY DAY  . nitroGLYCERIN (NITROSTAT) 0.4 MG SL tablet PLACE 1 TABLET (0.4 MG TOTAL) UNDER THE TONGUE EVERY 5 (FIVE) MINUTES AS NEEDED FOR CHEST PAIN.  [name] ondansetron [facility]  ODT) 4 MG disintegrating tablet Dissolve 1 tablet in mouth every 8 hours as needed for nausea/ vomiting  . pantoprazole (PROTONIX) 40 MG tablet Take 1 tablet (40 mg total) by mouth 2 (two) times daily.  [name] PARoxetine (PAXIL) 20 MG tablet Take 1 tablet (20 mg total) by mouth daily.  [name] Plecanatide (TRULANCE) 3 MG TABS Take 1 tablet by mouth daily.  . promethazine (PHENERGAN) 25 MG tablet Take 0.5-1 tablets (12.5-25 mg total) by mouth 2 (two) times daily as needed for nausea or vomiting.   Current Facility-Administered Medications for the 01/06/21 encounter (Procedure visit) with Gatha Mayer, MD  Medication  . cyanocobalamin ((VITAMIN B-12)) injection  1,000 mcg  . cyanocobalamin ((VITAMIN B-12)) injection 1,000 mcg   Past Medical History:  Diagnosis Date  . Allergy   . Allergy to alpha-gal   . Anxiety   . Arthritis   . CAD (coronary artery disease), native coronary artery 12/03/2015  . Coronary artery disease   . Dysrhythmia    "irregular and PAC'S"  . GERD (gastroesophageal reflux disease)   . Head injury with loss of consciousness ([place])    back in the 1980's  . History of kidney stones   . Hyperlipidemia   . Hypertension   . Hypothyroid   . Multiple benign nevi   . Neuromuscular disorder ([place]) 2011   LEFT RADIAL NERVE SURGERY R/T TRAUMA  . Pneumonia 2014, 2017  . Prostatism   . Pulmonary nodule   . Spinal cord stimulator status    told to bring remote to surgery   Past Surgical History:  Procedure Laterality Date  . BACK SURGERY     T12 - L1, 11 times including neck  . CARDIAC CATHETERIZATION N/A 12/02/2015   Procedure: Left Heart Cath and Coronary Angiography;  Surgeon: Peter M Martinique, MD;  Location: [facility];  Service: Cardiovascular;  Laterality: N/A;  . CARDIAC CATHETERIZATION N/A 12/02/2015   Procedure: Coronary Stent Intervention;  Surgeon: Peter M Martinique, MD;  Location: [facility];  Service: Cardiovascular;  Laterality: N/A;  mid LAD Promus 2.5x12  . CORONARY ANGIOPLASTY     one stent placed by Dr. P Martinique  . HARDWARE REMOVAL Right 08/26/2017   Procedure: Right Lumbar Five Revision of pedicle screw with Removal of Lumbar Five Screw;  Surgeon: Kristeen Miss, MD;  Location: [facility];  Service: Neurosurgery;  Laterality: Right;  posterior  . HARDWARE REMOVAL Right 12/27/2017   Procedure: Right Lumbar Two, Lumbar Three, Lumbar Four Pedicle screw removal with metrex;  Surgeon: Kristeen Miss, MD;  Location: [facility];  Service: Neurosurgery;  Laterality: Right;  Right L2 to L4 Pedicle screw removal with mterex  . LAPAROSCOPY N/A 09/01/2020   Procedure: LAPAROSCOPY DIAGNOSTIC;  Surgeon: Leighton Ruff, MD;   Location: [facility];  Service: General;  Laterality: N/A;  . MASS EXCISION  06/25/2012   Procedure: EXCISION MASS;  Surgeon: Wynonia Sours, MD;  Location: [facility];  Service: Orthopedics;  Laterality: Left;  transection of NEUROMA, BURYING RADIAL NERVE IN BRACHIORADIALIS LEFT SIDE  . radial nerve Left    cut in work injury  . RIGHT GREAT TOENAIL REMOVAL  04/2020  . SPINAL FUSION     C6-7  . TONSILLECTOMY  1982  . UMBILICAL HERNIA REPAIR N/A 09/01/2020   Procedure: PRIMARY UMBILICAL HERNIA REPAIR;  Surgeon: Leighton Ruff, MD;  Location: [facility];  Service: General;  Laterality: N/A;  . VASECTOMY     Social History  Social History Narrative   Lives at home with wife, new 50 year old adopted boy.     family history includes Congestive Heart Failure in his mother; Heart attack in his paternal grandfather and paternal grandmother; Heart attack (age of onset: 31) in his father; Heart failure (age of onset: 57) in his mother.   Review of Systems   Objective:   Physical Exam

## 2021-01-06 NOTE — Patient Instructions (Addendum)
It was a pleasure to meet you today. Based on our discussion, I am providing you with my recommendations below:  RECOMMENDATION(S):   Please use RectiCare or an Over The Counter Lidocaine cream as needed  Please use Pristine Toilet Paper Spray for post defecation hygiene.  FOLLOW UP:  I would like for you to follow up with me in 2 months. Please refer to the appointments listed within this AVS.  BMI:  . If you are age 60 or younger, your body mass index should be between 19-25. Your Body mass index is 30.25 kg/m. If this is out of the aformentioned range listed, please consider follow up with your Primary Care Provider.   MY CHART:  The [facility] providers would like to encourage you to use [facility] to communicate with providers for non-urgent requests or questions.  Due to long hold times on the telephone, sending your provider a message by [facility] may be a faster and more efficient way to get a response.  Please allow 48 business hours for a response.  Please remember that this is for non-urgent requests.   HEMORRHOID BANDING PROCEDURE    FOLLOW-UP CARE   1. The procedure you have had should have been relatively painless since the banding of the area involved does not have nerve endings and there is no pain sensation.  The rubber band cuts off the blood supply to the hemorrhoid and the band may fall off as soon as 48 hours after the banding (the band may occasionally be seen in the toilet bowl following a bowel movement). You may notice a temporary feeling of fullness in the rectum which should respond adequately to plain Tylenol or Motrin.  2. Following the banding, avoid strenuous exercise that evening and resume full activity the next day.  A sitz bath (soaking in a warm tub) or bidet is soothing, and can be useful for cleansing the area after bowel movements.     3. To avoid constipation, take two tablespoons of natural wheat bran, natural oat bran, flax, Benefiber or any over  the counter fiber supplement and increase your water intake to 7-8 glasses daily.    4. Unless you have been prescribed anorectal medication, do not put anything inside your rectum for two weeks: No suppositories, enemas, fingers, etc.  5. Occasionally, you may have more bleeding than usual after the banding procedure.  This is often from the untreated hemorrhoids rather than the treated one.  Don't be concerned if there is a tablespoon or so of blood.  If there is more blood than this, lie flat with your bottom higher than your head and apply an ice pack to the area. If the bleeding does not stop within a half an hour or if you feel faint, call our office at [phone] or go to the emergency room.  6. Problems are not common; however, if there is a substantial amount of bleeding, severe pain, chills, fever or difficulty passing urine (very rare) or other problems, you should call us at [phone] or report to the nearest emergency room.  7. Do not stay seated continuously for more than 2-3 hours for a day or two after the procedure.  Tighten your buttock muscles 10-15 times every two hours and take 10-15 deep breaths every 1-2 hours.  Do not spend more than a few minutes on the toilet if you cannot empty your bowel; instead re-visit the toilet at a later time.      Thank  you for trusting me with your gastrointestinal care!    Silvano Rusk, MD

## 2021-01-06 NOTE — Progress Notes (Signed)
   Patient had a recent colonoscopy demonstrating hemorrhoids.  He has rectal bleeding anal itching and burning at times.  Moves his bowels 15-20 times a week.  Currently using MiraLAX.  Does strain to defecate at times.  He has rods in his spine and pelvis which cause some discomfort and pain.   Rectal exam reveals no mass digital exam is nontender  Anoscopy reveals grade 2 internal hemorrhoids in all 3 positions   PROCEDURE NOTE: The patient presents with symptomatic grade 2 hemorrhoids, requesting rubber band ligation of his/her hemorrhoidal disease.  All risks, benefits and alternative forms of therapy were described and informed consent was obtained.   The anorectum was pre-medicated with 5% lidocaine 0.125% nitroglycerin The decision was made to band the all 3 internal hemorrhoid columns, and the [place] was used to perform band ligation without complication.  Digital anorectal examination was then performed to assure proper positioning of the band, and to adjust the banded tissue as required.  The patient was discharged home without pain or other issues.  Dietary and behavioral recommendations were given and along with follow-up instructions.     The following adjunctive treatments were recommended:  Topical lidocaine as needed for pruritus ani  The patient will return in 2 months for  follow-up and possible additional banding as required. No complications were encountered and the patient tolerated the procedure well.  I appreciate the opportunity to care for this patient. CC: Janora Norlander, DO Dr. Oretha Caprice

## 2021-01-09 NOTE — Progress Notes (Signed)
Thanks, Glendell Docker.

## 2021-01-12 ENCOUNTER — Other Ambulatory Visit: Payer: Self-pay | Admitting: Family Medicine

## 2021-01-12 DIAGNOSIS — I10 Essential (primary) hypertension: Secondary | ICD-10-CM

## 2021-01-20 ENCOUNTER — Other Ambulatory Visit: Payer: Self-pay

## 2021-01-20 ENCOUNTER — Ambulatory Visit (INDEPENDENT_AMBULATORY_CARE_PROVIDER_SITE_OTHER): Payer: Medicare Other | Admitting: Family Medicine

## 2021-01-20 DIAGNOSIS — E538 Deficiency of other specified B group vitamins: Secondary | ICD-10-CM | POA: Diagnosis not present

## 2021-01-29 NOTE — Progress Notes (Signed)
Cardiology Office Note   Date:  02/01/2021   ID:  Jeffrey Hooper, DOB 1961-01-20, MRN [id number]  PCP:  Janora Norlander, DO  Cardiologist:   Minus Breeding, MD   Chief Complaint  Patient presents with   Coronary Artery Disease       History of Present Illness: Jeffrey Hooper is a 60 y.o. male who presents follow up of CAD.  Stress perfusion study in 2017 suggested a moderate sized defect in the inferior wall with ischemia. The EF was well-preserved.  The patient presented to [facility] on 12/02/15 for cath and was found to have single vessel obstructive CAD involving the mid LAD. He underwent successful PCI with a mid LAD DES.  He had chest pain in 2021 and had a negative perfusion study.    We saw him in Dec 2021 prior to diagnostic lap   Since I last saw him he is actually done well.  He still has a couple of aches and pains in his chest occasionally but is not different than when he had a stress test.  He occasionally has some palpitations but these are isolated nonsustained.  He said no presyncope or syncope.  He is limited by his chronic back pain and leg weakness and walks with a cane but he is able to do some light chores such as pushing a vacuum cleaner. The patient denies any new symptoms such as chest discomfort, neck or arm discomfort. There has been no new shortness of breath, PND or orthopnea. There have been no reported palpitations, presyncope or syncope.    Past Medical History:  Diagnosis Date   Allergy    Allergy to alpha-gal    Anxiety    Arthritis    CAD (coronary artery disease), native coronary artery 12/03/2015   Coronary artery disease    Dysrhythmia    "irregular and PAC'S"   GERD (gastroesophageal reflux disease)    Head injury with loss of consciousness ([place])    back in the 1980's   History of kidney stones    Hyperlipidemia    Hypertension    Hypothyroid    Multiple benign nevi    Neuromuscular disorder ([place]) 2011   LEFT RADIAL NERVE SURGERY R/T  TRAUMA   Pneumonia 2014, 2017   Prostatism    Pulmonary nodule    Spinal cord stimulator status    told to bring remote to surgery    Past Surgical History:  Procedure Laterality Date   BACK SURGERY     T12 - L1, 11 times including neck   CARDIAC CATHETERIZATION N/A 12/02/2015   Procedure: Left Heart Cath and Coronary Angiography;  Surgeon: Peter M Martinique, MD;  Location: [place] [facility];  Service: Cardiovascular;  Laterality: N/A;   CARDIAC CATHETERIZATION N/A 12/02/2015   Procedure: Coronary Stent Intervention;  Surgeon: Peter M Martinique, MD;  Location: [place] [facility];  Service: Cardiovascular;  Laterality: N/A;  mid LAD Promus 2.5x12   CORONARY ANGIOPLASTY     one stent placed by Dr. P Martinique   HARDWARE REMOVAL Right 08/26/2017   Procedure: Right Lumbar Five Revision of pedicle screw with Removal of Lumbar Five Screw;  Surgeon: Kristeen Miss, MD;  Location: [place];  Service: Neurosurgery;  Laterality: Right;  posterior   HARDWARE REMOVAL Right 12/27/2017   Procedure: Right Lumbar Two, Lumbar Three, Lumbar Four Pedicle screw removal with metrex;  Surgeon: Kristeen Miss, MD;  Location: [place];  Service: Neurosurgery;  Laterality: Right;  Right L2 to L4 Pedicle screw removal with mterex   LAPAROSCOPY N/A 09/01/2020   Procedure: LAPAROSCOPY DIAGNOSTIC;  Surgeon: Leighton Ruff, MD;  Location: [facility];  Service: General;  Laterality: N/A;   MASS EXCISION  06/25/2012   Procedure: EXCISION MASS;  Surgeon: Wynonia Sours, MD;  Location: [place];  Service: Orthopedics;  Laterality: Left;  transection of NEUROMA, BURYING RADIAL NERVE IN BRACHIORADIALIS LEFT SIDE   radial nerve Left    cut in work injury   RIGHT GREAT TOENAIL REMOVAL  04/2020   SPINAL FUSION     C6-7   TONSILLECTOMY  9211   UMBILICAL HERNIA REPAIR N/A 09/01/2020   Procedure: PRIMARY UMBILICAL HERNIA REPAIR;  Surgeon: Leighton Ruff, MD;  Location: [facility];  Service: General;  Laterality: N/A;   VASECTOMY        Current Outpatient Medications  Medication Sig Dispense Refill   acetaminophen (TYLENOL) 500 MG tablet Take 1,000 mg by mouth daily as needed for mild pain or headache.      amLODipine (NORVASC) 10 MG tablet Take 1 tablet (10 mg total) by mouth daily. 90 tablet 3   aspirin EC 81 MG tablet Take 81 mg by mouth at bedtime.     atorvastatin (LIPITOR) 80 MG tablet TAKE 1 TABLET BY MOUTH EVERYDAY AT BEDTIME 90 tablet 0   betamethasone dipropionate (DIPROLENE) 0.05 % cream APPLY TO AFFECTED AREA TWICE A DAY (Patient taking differently: Apply 1 application topically 2 (two) times daily as needed (itching/rash).) 30 g 0   cetirizine (ZYRTEC) 10 MG tablet Take 10 mg by mouth daily.     diphenhydrAMINE (BENADRYL) 25 MG tablet Take 25 mg by mouth 2 (two) times daily as needed for itching.      fluticasone (FLONASE) 50 MCG/ACT nasal spray Place 2 sprays into both nostrils daily. 48 g 1   hydrochlorothiazide (HYDRODIURIL) 50 MG tablet TAKE 1 TABLET BY MOUTH EVERY DAY 90 tablet 1   HYDROcodone-acetaminophen (NORCO/VICODIN) 5-325 MG tablet Take 1 tablet by mouth every 6 (six) hours as needed for moderate pain. for pain     ketoconazole (NIZORAL) 2 % shampoo Apply 1 application topically 3 (three) times a week.     levothyroxine (SYNTHROID) 100 MCG tablet TAKE 1 TABLET BY MOUTH EVERY DAY 90 tablet 2   nitroGLYCERIN (NITROSTAT) 0.4 MG SL tablet PLACE 1 TABLET (0.4 MG TOTAL) UNDER THE TONGUE EVERY 5 (FIVE) MINUTES AS NEEDED FOR CHEST PAIN. 25 tablet 2   ondansetron (ZOFRAN ODT) 4 MG disintegrating tablet Dissolve 1 tablet in mouth every 8 hours as needed for nausea/ vomiting 60 tablet 0   pantoprazole (PROTONIX) 40 MG tablet Take 1 tablet (40 mg total) by mouth 2 (two) times daily. 180 tablet 3   PARoxetine (PAXIL) 20 MG tablet Take 1 tablet (20 mg total) by mouth daily. 90 tablet 0   promethazine (PHENERGAN) 25 MG tablet Take 0.5-1 tablets (12.5-25 mg total) by mouth 2 (two) times daily as needed for nausea  or vomiting. 45 tablet 1   EPINEPHRINE 0.3 mg/0.3 mL IJ SOAJ injection INJECT 0.3 MLS (0.3 MG TOTAL) INTO THE MUSCLE AS NEEDED FOR ANAPHYLAXIS. 2 each 3   meloxicam (MOBIC) 7.5 MG tablet Take 7.5 mg by mouth in the morning and at bedtime. (Patient not taking: No sig reported)     Plecanatide (TRULANCE) 3 MG TABS Take 1 tablet by mouth daily. (Patient not taking: Reported on 02/01/2021) 8 tablet 0   Current Facility-Administered Medications  Medication  Dose Route Frequency Provider Last Rate Last Admin   cyanocobalamin ((VITAMIN B-12)) injection 1,000 mcg  1,000 mcg Intramuscular Weekly Gottschalk, Leatrice Jewels M, DO   1,000 mcg at 11/21/20 1059   cyanocobalamin ((VITAMIN B-12)) injection 1,000 mcg  1,000 mcg Intramuscular Q30 days Ronnie Doss M, DO   1,000 mcg at 01/20/21 6644    Allergies:   Alpha-gal, Beef (bovine) protein, Beef-derived products, Lambs quarters, Pork-derived products, Prednisone, Brilinta [ticagrelor], Doxycycline, Gabapentin, Lyrica [pregabalin], Methylprednisolone sodium succ, Penicillins, Shellfish allergy, Adhesive [tape], and Fentanyl    ROS:  Please see the history of present illness.   Otherwise, review of systems are positive for none.    All other systems are reviewed and negative.    PHYSICAL EXAM: VS:  BP 120/78   Pulse 62   Ht 5\' 10"  (1.778 m)   Wt 208 lb (94.3 kg)   BMI 29.84 kg/m  , BMI Body mass index is 29.84 kg/m. GENERAL:  Well appearing NECK:  No jugular venous distention, waveform within normal limits, carotid upstroke brisk and symmetric, no bruits, no thyromegaly LUNGS:  Clear to auscultation bilaterally CHEST:  Unremarkable HEART:  PMI not displaced or sustained,S1 and S2 within normal limits, no S3, no S4, no clicks, no rubs, no murmurs ABD:  Flat, positive bowel sounds normal in frequency in pitch, no bruits, no rebound, no guarding, no midline pulsatile mass, no hepatomegaly, no splenomegaly EXT:  2 plus pulses throughou    EKG:  EKG is   ordered today. Sinus rhythm, rate 62, axis within normal limits, intervals within normal limits, no acute ST-T wave changes.   Recent Labs: 06/15/2020: Magnesium 2.2 09/21/2020: ALT 35; BNP 20.4; BUN 15; Creatinine, Ser 1.15; Potassium 4.2; Sodium 144 10/28/2020: TSH 4.190 12/20/2020: Hemoglobin 13.3; Platelets 241    Lipid Panel    Component Value Date/Time   CHOL 136 09/16/2019 1005   CHOL 136 10/29/2012 0922   TRIG 73 09/16/2019 1005   TRIG 139 04/28/2013 1505   TRIG 103 10/29/2012 0922   HDL 54 09/16/2019 1005   HDL 44 04/28/2013 1505   HDL 41 10/29/2012 0922   CHOLHDL 2.5 09/16/2019 1005   LDLCALC 67 09/16/2019 1005   LDLCALC 50 04/28/2013 1505   LDLCALC 74 10/29/2012 0922      Wt Readings from Last 3 Encounters:  02/01/21 208 lb (94.3 kg)  01/06/21 210 lb 12.8 oz (95.6 kg)  12/20/20 210 lb (95.3 kg)      Other studies Reviewed: Additional studies/ records that were reviewed today include:  None Review of the above records demonstrates:  NA   ASSESSMENT AND PLAN:  CAD:  The patient has no new sypmtoms.  No further cardiovascular testing is indicated.  We will continue with aggressive risk reduction and meds as listed.    Essential hypertension:   The blood pressure is at target. No change in medications is indicated. We will continue with therapeutic lifestyle changes (TLC).  Palpitations:    These are not particularly problematic.  No change in therapy.   Dyslipidemia: LDL was at target last year but I given him written instructions to get this repeated at his next office appointment.  Current medicines are reviewed at length with the patient today.  The patient does not have concerns regarding medicines.  The following changes have been made:  None  Labs/ tests ordered today include:    Orders Placed This Encounter  Procedures   Hepatic function panel   Lipid panel   EKG  12-Lead      Disposition:   FU with me in 12 months    Signed, Minus Breeding, MD  02/01/2021 2:10 PM    [facility]

## 2021-02-01 ENCOUNTER — Encounter: Payer: Self-pay | Admitting: Cardiology

## 2021-02-01 ENCOUNTER — Other Ambulatory Visit: Payer: Self-pay

## 2021-02-01 ENCOUNTER — Ambulatory Visit (INDEPENDENT_AMBULATORY_CARE_PROVIDER_SITE_OTHER): Payer: Medicare Other | Admitting: Cardiology

## 2021-02-01 VITALS — BP 120/78 | HR 62 | Ht 70.0 in | Wt 208.0 lb

## 2021-02-01 DIAGNOSIS — R002 Palpitations: Secondary | ICD-10-CM

## 2021-02-01 DIAGNOSIS — E785 Hyperlipidemia, unspecified: Secondary | ICD-10-CM | POA: Diagnosis not present

## 2021-02-01 DIAGNOSIS — Z79899 Other long term (current) drug therapy: Secondary | ICD-10-CM

## 2021-02-01 DIAGNOSIS — I10 Essential (primary) hypertension: Secondary | ICD-10-CM | POA: Diagnosis not present

## 2021-02-01 DIAGNOSIS — E782 Mixed hyperlipidemia: Secondary | ICD-10-CM

## 2021-02-01 DIAGNOSIS — I2510 Atherosclerotic heart disease of native coronary artery without angina pectoris: Secondary | ICD-10-CM

## 2021-02-01 NOTE — Patient Instructions (Signed)
Medication Instructions:  The current medical regimen is effective;  continue present plan and medications.  *If you need a refill on your cardiac medications before your next appointment, please call your pharmacy*  Lab Work Please have blood work at your next visit with PCP.  (Lipid,hepatic panel)The current medical regimen is effective;  continue present plan and medications.  If you have labs (blood work) drawn today and your tests are completely normal, you will receive your results only by: [place] (if you have MyChart) OR A paper copy in the mail If you have any lab test that is abnormal or we need to change your treatment, we will call you to review the results.  Follow-Up: At [facility], you and your health needs are our priority.  As part of our continuing mission to provide you with exceptional heart care, we have created designated Provider Care Teams.  These Care Teams include your primary Cardiologist (physician) and Advanced Practice Providers (APPs -  Physician Assistants and Nurse Practitioners) who all work together to provide you with the care you need, when you need it.  We recommend signing up for the patient portal called "MyChart".  Sign up information is provided on this After Visit Summary.  MyChart is used to connect with patients for Virtual Visits (Telemedicine).  Patients are able to view lab/test results, encounter notes, upcoming appointments, etc.  Non-urgent messages can be sent to your provider as well.   To learn more about what you can do with MyChart, go to [email or web address].    Your next appointment:   1 year(s)  The format for your next appointment:   In Person  Provider:   Minus Breeding, MD   The current medical regimen is effective;  continue present plan and medications.

## 2021-02-20 ENCOUNTER — Other Ambulatory Visit: Payer: Self-pay

## 2021-02-20 ENCOUNTER — Ambulatory Visit (INDEPENDENT_AMBULATORY_CARE_PROVIDER_SITE_OTHER): Payer: Medicare Other | Admitting: *Deleted

## 2021-02-20 DIAGNOSIS — E538 Deficiency of other specified B group vitamins: Secondary | ICD-10-CM | POA: Diagnosis not present

## 2021-02-20 DIAGNOSIS — Z79899 Other long term (current) drug therapy: Secondary | ICD-10-CM

## 2021-02-20 DIAGNOSIS — E782 Mixed hyperlipidemia: Secondary | ICD-10-CM

## 2021-02-21 LAB — LIPID PANEL
Chol/HDL Ratio: 3 ratio (ref 0.0–5.0)
Cholesterol, Total: 127 mg/dL (ref 100–199)
HDL: 43 mg/dL (ref >39)
LDL Chol Calc (NIH): 63 mg/dL (ref 0–99)
Triglycerides: 118 mg/dL (ref 0–149)
VLDL Cholesterol Cal: 21 mg/dL (ref 5–40)

## 2021-02-21 LAB — HEPATIC FUNCTION PANEL
ALT: 34 IU/L (ref 0–44)
AST: 28 IU/L (ref 0–40)
Albumin: 4.5 g/dL (ref 3.8–4.9)
Alkaline Phosphatase: 132 IU/L — ABNORMAL HIGH (ref 44–121)
Bilirubin Total: 0.4 mg/dL (ref 0.0–1.2)
Bilirubin, Direct: 0.16 mg/dL (ref 0.00–0.40)
Total Protein: 6.3 g/dL (ref 6.0–8.5)

## 2021-02-24 ENCOUNTER — Encounter: Payer: Self-pay | Admitting: *Deleted

## 2021-03-03 ENCOUNTER — Other Ambulatory Visit: Payer: Self-pay | Admitting: Family Medicine

## 2021-03-03 DIAGNOSIS — F411 Generalized anxiety disorder: Secondary | ICD-10-CM

## 2021-03-16 ENCOUNTER — Other Ambulatory Visit: Payer: Self-pay | Admitting: Family Medicine

## 2021-03-16 DIAGNOSIS — E782 Mixed hyperlipidemia: Secondary | ICD-10-CM

## 2021-03-21 ENCOUNTER — Ambulatory Visit (INDEPENDENT_AMBULATORY_CARE_PROVIDER_SITE_OTHER): Payer: Medicare Other | Admitting: Internal Medicine

## 2021-03-21 ENCOUNTER — Encounter: Payer: Self-pay | Admitting: Internal Medicine

## 2021-03-21 VITALS — BP 132/60 | HR 64 | Ht 67.75 in | Wt 205.1 lb

## 2021-03-21 DIAGNOSIS — L290 Pruritus ani: Secondary | ICD-10-CM | POA: Diagnosis not present

## 2021-03-21 DIAGNOSIS — I2510 Atherosclerotic heart disease of native coronary artery without angina pectoris: Secondary | ICD-10-CM | POA: Diagnosis not present

## 2021-03-21 DIAGNOSIS — K641 Second degree hemorrhoids: Secondary | ICD-10-CM | POA: Diagnosis not present

## 2021-03-21 NOTE — Patient Instructions (Addendum)
Please call Dr Ledell Peoples office and make an appointment for your Pruritus ani.  Stop using the betamethasone.   Avoid over wiping the rectal area. Use a hair dryer on low to dry with when possible.   Try the samples of calmoseptine we have given you today.  You can use this several times a day.   I appreciate the opportunity to care for you. Silvano Rusk, MD, [facility]

## 2021-03-21 NOTE — Progress Notes (Signed)
Jeffrey Hooper 60 y.o. Jan 29, 1961 [id number]  Assessment & Plan:   Encounter Diagnoses  Name Primary?   Pruritus ani Yes   Prolapsed internal hemorrhoids, grade 2      The skin is a bit discolored around the anus.  Perhaps this is some type of a dermatitis, it is a minor change in nothing jumps out at me as far as a cause.  I think he should stop betamethasone as that could cause too much skin thinning.  He should avoid over wiping use a hair dryer on low or cool setting to dry after the shower.  Trial of Calmoseptine.  Dermatology referral for further evaluation and treatment.  [facility] dermatology, he is established there.  Hemorrhoids seem to be adequately improved there is no further bleeding.  Continue MiraLAX to control constipation he may need to titrate the dose down a little bit.  We discussed that.  See me as needed.  I appreciate the opportunity to care for this patient. CC: [facility]   Subjective:   Chief Complaint: Anal itching and follow-up of hemorrhoids  HPI Jeffrey Hooper is here for follow-up after a June banding of all 3 grade 2 internal hemorrhoids.  Subsequently bleeding has stopped.  He reports having a "little constipation and diarrhea".  In general he moves his bowels with ease.  Sometimes he thinks the MiraLAX he takes daily may be too much and has had some loose stools very rare straining.  Some anal swelling and discomfort a few days ago but he also has persistent pruritus ani complaints most days which has been a problem for years.  He is continuing to use some betamethasone that was prescribed by primary care.  He also has tried lidocaine at my recommendation but it did not help.  The itching is more of a problem in the evening and it is bothersome.  It is not associated with defecation necessarily.  He does not have fecal soiling.  Allergies  Allergen Reactions   Alpha-Gal Rash and Other (See Comments)    SEVERE ALLERGY  TO ANY MEAT OR MEAT DERIVED PRODUCTS FROM 4 LEGGED ANIMALS > > BEEF, PORK , GOATS, DEER, ETC. < < RESULT OF BITE FROM LONE STAR TICK   Beef (Bovine) Protein Rash and Other (See Comments)   Beef-Derived Products Rash and Other (See Comments)   Lambs Quarters Rash and Other (See Comments)   Pork-Derived Products Rash and Other (See Comments)   Prednisone Other (See Comments)    im depo medrol caused dizziness - pt passed out. Ask pt before giving. Pt can take oral prednisone    Brilinta [Ticagrelor] Hives    Pt ate pork on the same day he took Brilinta before knowing he had alpha gal. May have been the alpha gal reaction, pt is unsure.    Doxycycline Hives   Gabapentin Swelling   Lyrica [Pregabalin]     Dry mouth and felt raw   Methylprednisolone Sodium Succ     im depo medrol caused dizziness - pt passed out. Ask pt before giving. Issue occurred when administered in the left arm    Penicillins Hives    Did it involve swelling of the face/tongue/throat, SOB, or low BP? No Did it involve sudden or severe rash/hives, skin peeling, or any reaction on the inside of your mouth or nose? No Did you need to seek medical attention at a hospital or doctor's office? No When did it last happen?  10 + years ago If all above answers are "NO", may proceed with cephalosporin use.    Shellfish Allergy Hives   Adhesive [Tape] Rash   Fentanyl Rash    Reaction to adhesive, not the drug    Current Meds  Medication Sig   acetaminophen (TYLENOL) 500 MG tablet Take 1,000 mg by mouth daily as needed for mild pain or headache.    amLODipine (NORVASC) 10 MG tablet Take 1 tablet (10 mg total) by mouth daily.   aspirin EC 81 MG tablet Take 81 mg by mouth at bedtime.   atorvastatin (LIPITOR) 80 MG tablet TAKE 1 TABLET BY MOUTH EVERYDAY AT BEDTIME   cetirizine (ZYRTEC) 10 MG tablet Take 10 mg by mouth daily.   diphenhydrAMINE (BENADRYL) 25 MG tablet Take 25 mg by mouth 2 (two) times daily as needed for  itching.    fluticasone (FLONASE) 50 MCG/ACT nasal spray Place 2 sprays into both nostrils daily.   hydrochlorothiazide (HYDRODIURIL) 50 MG tablet TAKE 1 TABLET BY MOUTH EVERY DAY   HYDROcodone-acetaminophen (NORCO/VICODIN) 5-325 MG tablet Take 1 tablet by mouth every 6 (six) hours as needed for moderate pain. for pain   ketoconazole (NIZORAL) 2 % shampoo Apply 1 application topically 3 (three) times a week.   levothyroxine (SYNTHROID) 100 MCG tablet TAKE 1 TABLET BY MOUTH EVERY DAY   ondansetron (ZOFRAN ODT) 4 MG disintegrating tablet Dissolve 1 tablet in mouth every 8 hours as needed for nausea/ vomiting   pantoprazole (PROTONIX) 40 MG tablet Take 1 tablet (40 mg total) by mouth 2 (two) times daily.   PARoxetine (PAXIL) 20 MG tablet TAKE 1 TABLET BY MOUTH EVERY DAY   promethazine (PHENERGAN) 25 MG tablet Take 0.5-1 tablets (12.5-25 mg total) by mouth 2 (two) times daily as needed for nausea or vomiting.   [DISCONTINUED] betamethasone dipropionate (DIPROLENE) 0.05 % cream APPLY TO AFFECTED AREA TWICE A DAY (Patient taking differently: Apply 1 application topically 2 (two) times daily as needed (itching/rash).)   Current Facility-Administered Medications for the 03/21/21 encounter (Office Visit) with Gatha Mayer, MD  Medication   cyanocobalamin ((VITAMIN B-12)) injection 1,000 mcg   cyanocobalamin ((VITAMIN B-12)) injection 1,000 mcg   Past Medical History:  Diagnosis Date   Allergy    Allergy to alpha-gal    Anxiety    Arthritis    CAD (coronary artery disease), native coronary artery 12/03/2015   Coronary artery disease    Dysrhythmia    "irregular and PAC'S"   GERD (gastroesophageal reflux disease)    Head injury with loss of consciousness ([place])    back in the 1980's   History of kidney stones    Hyperlipidemia    Hypertension    Hypothyroid    Multiple benign nevi    Neuromuscular disorder ([place]) 2011   LEFT RADIAL NERVE SURGERY R/T TRAUMA   Pneumonia 2014, 2017    Prostatism    Pulmonary nodule    Spinal cord stimulator status    told to bring remote to surgery   Past Surgical History:  Procedure Laterality Date   BACK SURGERY     T12 - L1, 11 times including neck   CARDIAC CATHETERIZATION N/A 12/02/2015   Procedure: Left Heart Cath and Coronary Angiography;  Surgeon: Peter M Martinique, MD;  Location: [place] [facility];  Service: Cardiovascular;  Laterality: N/A;   CARDIAC CATHETERIZATION N/A 12/02/2015   Procedure: Coronary Stent Intervention;  Surgeon: Peter M Martinique, MD;  Location: [place] [facility];  Service:  Cardiovascular;  Laterality: N/A;  mid LAD Promus 2.5x12   COLONOSCOPY     CORONARY ANGIOPLASTY     one stent placed by Dr. P Martinique   HARDWARE REMOVAL Right 08/26/2017   Procedure: Right Lumbar Five Revision of pedicle screw with Removal of Lumbar Five Screw;  Surgeon: Kristeen Miss, MD;  Location: [place];  Service: Neurosurgery;  Laterality: Right;  posterior   HARDWARE REMOVAL Right 12/27/2017   Procedure: Right Lumbar Two, Lumbar Three, Lumbar Four Pedicle screw removal with metrex;  Surgeon: Kristeen Miss, MD;  Location: [place];  Service: Neurosurgery;  Laterality: Right;  Right L2 to L4 Pedicle screw removal with mterex   HEMORRHOID BANDING     LAPAROSCOPY N/A 09/01/2020   Procedure: LAPAROSCOPY DIAGNOSTIC;  Surgeon: Leighton Ruff, MD;  Location: [facility];  Service: General;  Laterality: N/A;   MASS EXCISION  06/25/2012   Procedure: EXCISION MASS;  Surgeon: Wynonia Sours, MD;  Location: [place];  Service: Orthopedics;  Laterality: Left;  transection of NEUROMA, BURYING RADIAL NERVE IN BRACHIORADIALIS LEFT SIDE   radial nerve Left    cut in work injury   RIGHT GREAT TOENAIL REMOVAL  04/2020   SPINAL FUSION     C6-7   TONSILLECTOMY  [id number]   UMBILICAL HERNIA REPAIR N/A 09/01/2020   Procedure: PRIMARY UMBILICAL HERNIA REPAIR;  Surgeon: Leighton Ruff, MD;  Location: [facility];  Service: General;  Laterality: N/A;    VASECTOMY     Social History   Social History Narrative   Lives at home with wife, not working disabled from the city of [place]   They have an adopted son at home (age 55  2022)   Former smoker no alcohol tobacco or drug use now   family history includes Congestive Heart Failure in his mother; Heart attack in his paternal grandfather and paternal grandmother; Heart attack (age of onset: 28) in his father; Heart failure (age of onset: 31) in his mother.   Review of Systems As above  Objective:   Physical Exam BP 132/60 (BP Location: Left Arm, Patient Position: Sitting, Cuff Size: Normal)   Pulse 64   Ht 5' 7.75" (1.721 m) Comment: height measured without shoes  Wt 205 lb 2 oz (93 kg)   BMI 31.42 kg/m  Mild perianal hypopigmentation and pink discoloration Not a rash

## 2021-03-22 ENCOUNTER — Ambulatory Visit (INDEPENDENT_AMBULATORY_CARE_PROVIDER_SITE_OTHER): Payer: Medicare Other | Admitting: Family Medicine

## 2021-03-22 ENCOUNTER — Encounter: Payer: Self-pay | Admitting: Family Medicine

## 2021-03-22 ENCOUNTER — Other Ambulatory Visit: Payer: Self-pay

## 2021-03-22 VITALS — BP 135/80 | HR 61 | Temp 98.4°F | Ht 67.5 in | Wt 206.0 lb

## 2021-03-22 DIAGNOSIS — F411 Generalized anxiety disorder: Secondary | ICD-10-CM | POA: Diagnosis not present

## 2021-03-22 DIAGNOSIS — E782 Mixed hyperlipidemia: Secondary | ICD-10-CM | POA: Diagnosis not present

## 2021-03-22 DIAGNOSIS — I2510 Atherosclerotic heart disease of native coronary artery without angina pectoris: Secondary | ICD-10-CM

## 2021-03-22 DIAGNOSIS — E538 Deficiency of other specified B group vitamins: Secondary | ICD-10-CM

## 2021-03-22 NOTE — Progress Notes (Signed)
Subjective: CC: Anxiety PCP: Janora Norlander, DO [id number] Jeffrey Hooper is a 60 y.o. male presenting to clinic today for:  1.  Generalized anxiety disorder Patient is currently treated with Paxil 20 mg daily.  He has been on Cymbalta in the past but this did not help.  He notes that he has increased stressors from a 48 year old adopted child who has ADHD.  He reports difficulty falling asleep some nights.  His ringing in his ears often impacts his anxiety and ability to concentrate as well  2.  Hyperlipidemia/ CAD Patient had lipid panel performed in July which showed well-controlled LDL at 63, good HDL at 43.  He was continued on Lipitor and all other medications for CAD.  He continues to follow-up with Dr. [name] as scheduled.  No chest pain, shortness of breath  ROS: Per HPI  Allergies  Allergen Reactions   Alpha-Gal Rash and Other (See Comments)    SEVERE ALLERGY TO ANY MEAT OR MEAT DERIVED PRODUCTS FROM 4 LEGGED ANIMALS > > BEEF, PORK , GOATS, DEER, ETC. < < RESULT OF BITE FROM LONE STAR TICK   Beef (Bovine) Protein Rash and Other (See Comments)   Beef-Derived Products Rash and Other (See Comments)   Lambs Quarters Rash and Other (See Comments)   Pork-Derived Products Rash and Other (See Comments)   Prednisone Other (See Comments)    im depo medrol caused dizziness - pt passed out. Ask pt before giving. Pt can take oral prednisone    Brilinta [Ticagrelor] Hives    Pt ate pork on the same day he took Brilinta before knowing he had alpha gal. May have been the alpha gal reaction, pt is unsure.    Doxycycline Hives   Gabapentin Swelling   Lyrica [Pregabalin]     Dry mouth and felt raw   Methylprednisolone Sodium Succ     im depo medrol caused dizziness - pt passed out. Ask pt before giving. Issue occurred when administered in the left arm    Penicillins Hives    Did it involve swelling of the face/tongue/throat, SOB, or low BP? No Did it involve sudden or severe  rash/hives, skin peeling, or any reaction on the inside of your mouth or nose? No Did you need to seek medical attention at a hospital or doctor's office? No When did it last happen?      10 + years ago If all above answers are "NO", may proceed with cephalosporin use.    Shellfish Allergy Hives   Adhesive [Tape] Rash   Fentanyl Rash    Reaction to adhesive, not the drug    Past Medical History:  Diagnosis Date   Allergy    Allergy to alpha-gal    Anxiety    Arthritis    CAD (coronary artery disease), native coronary artery 12/03/2015   Coronary artery disease    Dysrhythmia    "irregular and PAC'S"   GERD (gastroesophageal reflux disease)    Head injury with loss of consciousness ([place])    back in the 1980's   History of kidney stones    Hyperlipidemia    Hypertension    Hypothyroid    Multiple benign nevi    Neuromuscular disorder ([place]) 2011   LEFT RADIAL NERVE SURGERY R/T TRAUMA   Pneumonia 2014, 2017   Prostatism    Pulmonary nodule    Spinal cord stimulator status    told to bring remote to surgery    Current Outpatient Medications:  acetaminophen (TYLENOL) 500 MG tablet, Take 1,000 mg by mouth daily as needed for mild pain or headache. , Disp: , Rfl:    aspirin EC 81 MG tablet, Take 81 mg by mouth at bedtime., Disp: , Rfl:    atorvastatin (LIPITOR) 80 MG tablet, TAKE 1 TABLET BY MOUTH EVERYDAY AT BEDTIME, Disp: 90 tablet, Rfl: 0   cetirizine (ZYRTEC) 10 MG tablet, Take 10 mg by mouth daily., Disp: , Rfl:    diphenhydrAMINE (BENADRYL) 25 MG tablet, Take 25 mg by mouth 2 (two) times daily as needed for itching. , Disp: , Rfl:    EPINEPHRINE 0.3 mg/0.3 mL IJ SOAJ injection, INJECT 0.3 MLS (0.3 MG TOTAL) INTO THE MUSCLE AS NEEDED FOR ANAPHYLAXIS., Disp: 2 each, Rfl: 3   fluticasone (FLONASE) 50 MCG/ACT nasal spray, Place 2 sprays into both nostrils daily., Disp: 48 g, Rfl: 1   hydrochlorothiazide (HYDRODIURIL) 50 MG tablet, TAKE 1 TABLET BY MOUTH EVERY DAY, Disp: 90  tablet, Rfl: 1   HYDROcodone-acetaminophen (NORCO/VICODIN) 5-325 MG tablet, Take 1 tablet by mouth every 6 (six) hours as needed for moderate pain. for pain, Disp: , Rfl:    ketoconazole (NIZORAL) 2 % shampoo, Apply 1 application topically 3 (three) times a week., Disp: , Rfl:    levothyroxine (SYNTHROID) 100 MCG tablet, TAKE 1 TABLET BY MOUTH EVERY DAY, Disp: 90 tablet, Rfl: 2   meloxicam (MOBIC) 7.5 MG tablet, Take 7.5 mg by mouth in the morning and at bedtime., Disp: , Rfl:    nitroGLYCERIN (NITROSTAT) 0.4 MG SL tablet, PLACE 1 TABLET (0.4 MG TOTAL) UNDER THE TONGUE EVERY 5 (FIVE) MINUTES AS NEEDED FOR CHEST PAIN., Disp: 25 tablet, Rfl: 2   ondansetron (ZOFRAN ODT) 4 MG disintegrating tablet, Dissolve 1 tablet in mouth every 8 hours as needed for nausea/ vomiting, Disp: 60 tablet, Rfl: 0   OVER THE COUNTER MEDICATION, Calmosepatine ointment -use as directed, Disp: , Rfl:    pantoprazole (PROTONIX) 40 MG tablet, Take 1 tablet (40 mg total) by mouth 2 (two) times daily., Disp: 180 tablet, Rfl: 3   PARoxetine (PAXIL) 20 MG tablet, TAKE 1 TABLET BY MOUTH EVERY DAY, Disp: 90 tablet, Rfl: 0   promethazine (PHENERGAN) 25 MG tablet, Take 0.5-1 tablets (12.5-25 mg total) by mouth 2 (two) times daily as needed for nausea or vomiting., Disp: 45 tablet, Rfl: 1   amLODipine (NORVASC) 10 MG tablet, Take 1 tablet (10 mg total) by mouth daily., Disp: 90 tablet, Rfl: 3  Current Facility-Administered Medications:    cyanocobalamin ((VITAMIN B-12)) injection 1,000 mcg, 1,000 mcg, Intramuscular, Weekly, Nychelle Cassata M, DO, 1,000 mcg at 11/21/20 1059   cyanocobalamin ((VITAMIN B-12)) injection 1,000 mcg, 1,000 mcg, Intramuscular, Q30 days, Ronalda Walpole M, DO, 1,000 mcg at 02/20/21 [id number] Social History   Socioeconomic History   Marital status: Married    Spouse name: Not on file   Number of children: 3   Years of education: Not on file   Highest education level: Not on file  Occupational History    Occupation: [employer]  Tobacco Use   Smoking status: Former    Packs/day: 1.00    Years: 20.00    Pack years: 20.00    Types: Cigarettes    Quit date: 11/28/2006    Years since quitting: 14.3   Smokeless tobacco: Never  Vaping Use   Vaping Use: Never used  Substance and Sexual Activity   Alcohol use: Not Currently    Alcohol/week: 0.0 standard  drinks    Comment: rarely   Drug use: No   Sexual activity: Not Currently  Other Topics Concern   Not on file  Social History Narrative   Lives at home with wife, not working disabled from the city of [place]   They have an adopted son at home (age 85  2022)   Former smoker no alcohol tobacco or drug use now   Social Determinants of Radio broadcast assistant Strain: Not on file  Food Insecurity: Not on file  Transportation Needs: Not on file  Physical Activity: Not on file  Stress: Not on file  Social Connections: Not on file  Intimate Partner Violence: Not on file   Family History  Problem Relation Age of Onset   Heart failure Mother 77       Died age 76   Congestive Heart Failure Mother    Heart attack Father 48       Died with MI   Heart attack Paternal Grandmother    Heart attack Paternal Grandfather    Colon cancer Neg Hx     Objective: Office vital signs reviewed. BP 135/80   Pulse 61   Temp 98.4 F (36.9 C)   Ht 5' 7.5" (1.715 m)   Wt 206 lb (93.4 kg)   SpO2 96%   BMI 31.79 kg/m   Physical Examination:  General: Awake, alert, well nourished, No acute distress HEENT: Normal, sclera white, MMM Cardio: regular rate and rhythm, S1S2 heard, no murmurs appreciated Pulm: clear to auscultation bilaterally, no wheezes, rhonchi or rales; normal work of breathing on room air MSK: ambulating with use of cane Psych: mood stable, speech normal Depression screen [facility] 2/9 03/22/2021 12/20/2020 09/21/2020  Decreased Interest 0 0 0  Down, Depressed, Hopeless 0 0 0  PHQ - 2 Score 0 0 0  Altered sleeping  1 - 0  Tired, decreased energy 0 - 0  Change in appetite 0 - 0  Feeling bad or failure about yourself  0 - -  Trouble concentrating 2 - 0  Moving slowly or fidgety/restless 0 - 0  Suicidal thoughts 0 - 0  PHQ-9 Score 3 - 0  Difficult doing work/chores Not difficult at all - -  Some recent data might be hidden   GAD 7 : Generalized Anxiety Score 03/22/2021 09/21/2020 05/16/2020 09/16/2019  Nervous, Anxious, on Edge 0 '1 1 2  '$ Control/stop worrying 0 2 0 1  Worry too much - different things 0 2 0 1  Trouble relaxing 1 0 1 0  Restless 0 0 1 0  Easily annoyed or irritable 0 0 1 1  Afraid - awful might happen 0 0 0 1  Total GAD 7 Score '1 5 4 6  '$ Anxiety Difficulty Not difficult at all - Somewhat difficult -   Assessment/ Plan: 60 y.o. male   GAD (generalized anxiety disorder)  Mixed hyperlipidemia  Atherosclerosis of native coronary artery of native heart without angina pectoris  B12 deficiency  Currently treated with Paxil.  Plan to transition from Paxil to mirtazapine pending recommendations by pharmacy.  Would like to know how to transition safely.  Plan for mirtazapine 15 mg to assist with anxiety, panic and sleep.  We discussed alternatives including addition of BuSpar, counseling and GeneSight testing.  Handout was provided for GeneSight testing  We reviewed his labs which showed well-controlled cholesterol and normal liver function tests  B12 deficiency is stable.  He got his B12 injection today.  Plan to follow-up  in 6 to 8 weeks, sooner if needed for repeat check of mood   No orders of the defined types were placed in this encounter.  No orders of the defined types were placed in this encounter.    Janora Norlander, DO [place] [phone]

## 2021-03-22 NOTE — Patient Instructions (Signed)
Going to consult the pharmacist about your medication change.  Thinking that Mirtazapine would be a good fit for you  We also talked about Buspar as an option.  If you decide to see a counselor, call me and I can set this up

## 2021-03-23 ENCOUNTER — Ambulatory Visit: Payer: Self-pay

## 2021-04-24 ENCOUNTER — Telehealth: Payer: Self-pay | Admitting: *Deleted

## 2021-04-24 ENCOUNTER — Other Ambulatory Visit: Payer: Self-pay | Admitting: Family Medicine

## 2021-04-24 ENCOUNTER — Ambulatory Visit (INDEPENDENT_AMBULATORY_CARE_PROVIDER_SITE_OTHER): Payer: Medicare Other | Admitting: *Deleted

## 2021-04-24 ENCOUNTER — Other Ambulatory Visit: Payer: Self-pay

## 2021-04-24 DIAGNOSIS — E538 Deficiency of other specified B group vitamins: Secondary | ICD-10-CM

## 2021-04-24 DIAGNOSIS — Z23 Encounter for immunization: Secondary | ICD-10-CM

## 2021-04-24 DIAGNOSIS — F411 Generalized anxiety disorder: Secondary | ICD-10-CM

## 2021-04-24 MED ORDER — PAROXETINE HCL 20 MG PO TABS
10.0000 mg | ORAL_TABLET | Freq: Every day | ORAL | 0 refills | Status: DC
Start: 2021-04-24 — End: 2021-05-19

## 2021-04-24 MED ORDER — MIRTAZAPINE 7.5 MG PO TABS: 7.5000 mg | ORAL_TABLET | Freq: Every day | ORAL | 0 refills | Status: DC

## 2021-04-24 NOTE — Telephone Encounter (Signed)
Pt came by for B12 shot this morning - he questioned a possible med change, but wonders if you spoke with pharm about this?  He has a appt coming up with you - is this something that you will discuss that day?   Here is previous note:  Currently treated with Paxil.  Plan to transition from Paxil to mirtazapine pending recommendations by pharmacy.  Would like to know how to transition safely.  Plan for mirtazapine 15 mg to assist with anxiety, panic and sleep.  We discussed alternatives including addition of BuSpar, counseling and GeneSight testing.  Handout was provided for GeneSight testing

## 2021-04-24 NOTE — Telephone Encounter (Signed)
Cut paxil in half x4 weeks.  Will add low dose Mirtazapine to start while he is taking the reduced dose.  Then can STOP Paxil and we will increase Mirtazapine to mg.  Have him see me in 4 weeks.

## 2021-04-24 NOTE — Telephone Encounter (Signed)
Pt aware.

## 2021-05-17 ENCOUNTER — Other Ambulatory Visit: Payer: Self-pay | Admitting: Family Medicine

## 2021-05-19 ENCOUNTER — Ambulatory Visit (INDEPENDENT_AMBULATORY_CARE_PROVIDER_SITE_OTHER): Payer: Medicare Other | Admitting: Family Medicine

## 2021-05-19 ENCOUNTER — Other Ambulatory Visit: Payer: Self-pay

## 2021-05-19 ENCOUNTER — Encounter: Payer: Self-pay | Admitting: Family Medicine

## 2021-05-19 VITALS — BP 126/79 | HR 71 | Temp 97.8°F | Ht 67.5 in | Wt 211.8 lb

## 2021-05-19 DIAGNOSIS — B9689 Other specified bacterial agents as the cause of diseases classified elsewhere: Secondary | ICD-10-CM | POA: Diagnosis not present

## 2021-05-19 DIAGNOSIS — F411 Generalized anxiety disorder: Secondary | ICD-10-CM

## 2021-05-19 DIAGNOSIS — J208 Acute bronchitis due to other specified organisms: Secondary | ICD-10-CM | POA: Diagnosis not present

## 2021-05-19 DIAGNOSIS — E538 Deficiency of other specified B group vitamins: Secondary | ICD-10-CM | POA: Diagnosis not present

## 2021-05-19 MED ORDER — AZITHROMYCIN 250 MG PO TABS: ORAL_TABLET | ORAL | 0 refills | Status: DC

## 2021-05-19 MED ORDER — BENZONATATE 200 MG PO CAPS: 200.0000 mg | ORAL_CAPSULE | Freq: Two times a day (BID) | ORAL | 0 refills | Status: DC | PRN

## 2021-05-19 MED ORDER — PAROXETINE HCL 20 MG PO TABS: 20.0000 mg | ORAL_TABLET | Freq: Every day | ORAL | 3 refills | Status: DC

## 2021-05-19 NOTE — Progress Notes (Signed)
Subjective: CC: GAD PCP: Janora Norlander, DO Jeffrey Hooper is a 60 y.o. male presenting to clinic today for:  1. GAD Patient has been crosstapering Paxil 10mg  and Mirtazapine 7.5mg  for the last 1 month.  He reports today that sleep and anxiety have been okay.  He actually did not continue on the mirtazapine 7-1/2 and in fact went back up on the Paxil because he was not sure that the mirtazapine was doing much.  He notes that symptoms are slightly getting better at home anyways because the young man that is living with them is getting the help that he needs and had some med adjustments recently.  He is optimistic that he will not need to increase the Paxil but if so he will contact me to request this.  2.  Cough and congestion Patient reports about 1 week history of cough, congestion and postnasal drip.  He has been using over-the-counter products with not much help.  He is also been using Zyrtec and Flonase with again no improvement.  This morning he started developing brown in his cough.  He denies hemoptysis, shortness of breath, fevers.  He did a rapid COVID test at home last week and this was negative.  No known sick contacts.   ROS: Per HPI  Allergies  Allergen Reactions   Alpha-Gal Rash and Other (See Comments)    SEVERE ALLERGY TO ANY MEAT OR MEAT DERIVED PRODUCTS FROM 4 LEGGED ANIMALS > > BEEF, PORK , GOATS, DEER, ETC. < < RESULT OF BITE FROM LONE STAR TICK   Beef (Bovine) Protein Rash and Other (See Comments)   Beef-Derived Products Rash and Other (See Comments)   Lambs Quarters Rash and Other (See Comments)   Pork-Derived Products Rash and Other (See Comments)   Prednisone Other (See Comments)    im depo medrol caused dizziness - pt passed out. Ask pt before giving. Pt can take oral prednisone    Brilinta [Ticagrelor] Hives    Pt ate pork on the same day he took Brilinta before knowing he had alpha gal. May have been the alpha gal reaction, pt is unsure.     Doxycycline Hives   Gabapentin Swelling   Lyrica [Pregabalin]     Dry mouth and felt raw   Methylprednisolone Sodium Succ     im depo medrol caused dizziness - pt passed out. Ask pt before giving. Issue occurred when administered in the left arm    Penicillins Hives    Did it involve swelling of the face/tongue/throat, SOB, or low BP? No Did it involve sudden or severe rash/hives, skin peeling, or any reaction on the inside of your mouth or nose? No Did you need to seek medical attention at a hospital or doctor's office? No When did it last happen?      10 + years ago If all above answers are "NO", may proceed with cephalosporin use.    Shellfish Allergy Hives   Adhesive [Tape] Rash   Fentanyl Rash    Reaction to adhesive, not the drug    Past Medical History:  Diagnosis Date   Allergy    Allergy to alpha-gal    Anxiety    Arthritis    CAD (coronary artery disease), native coronary artery 12/03/2015   Coronary artery disease    Dysrhythmia    "irregular and PAC'S"   GERD (gastroesophageal reflux disease)    Head injury with loss of consciousness ([place])    back in the 1980's  History of kidney stones    Hyperlipidemia    Hypertension    Hypothyroid    Multiple benign nevi    Neuromuscular disorder ([place]) 2011   LEFT RADIAL NERVE SURGERY R/T TRAUMA   Pneumonia 2014, 2017   Prostatism    Pulmonary nodule    Spinal cord stimulator status    told to bring remote to surgery    Current Outpatient Medications:    acetaminophen (TYLENOL) 500 MG tablet, Take 1,000 mg by mouth daily as needed for mild pain or headache. , Disp: , Rfl:    amLODipine (NORVASC) 10 MG tablet, Take 1 tablet (10 mg total) by mouth daily., Disp: 90 tablet, Rfl: 3   aspirin EC 81 MG tablet, Take 81 mg by mouth at bedtime., Disp: , Rfl:    atorvastatin (LIPITOR) 80 MG tablet, TAKE 1 TABLET BY MOUTH EVERYDAY AT BEDTIME, Disp: 90 tablet, Rfl: 0   cetirizine (ZYRTEC) 10 MG tablet, Take 10 mg by mouth daily.,  Disp: , Rfl:    diphenhydrAMINE (BENADRYL) 25 MG tablet, Take 25 mg by mouth 2 (two) times daily as needed for itching. , Disp: , Rfl:    EPINEPHRINE 0.3 mg/0.3 mL IJ SOAJ injection, INJECT 0.3 MLS (0.3 MG TOTAL) INTO THE MUSCLE AS NEEDED FOR ANAPHYLAXIS., Disp: 2 each, Rfl: 3   fluticasone (FLONASE) 50 MCG/ACT nasal spray, Place 2 sprays into both nostrils daily., Disp: 48 g, Rfl: 1   hydrochlorothiazide (HYDRODIURIL) 50 MG tablet, TAKE 1 TABLET BY MOUTH EVERY DAY, Disp: 90 tablet, Rfl: 1   HYDROcodone-acetaminophen (NORCO/VICODIN) 5-325 MG tablet, Take 1 tablet by mouth every 6 (six) hours as needed for moderate pain. for pain, Disp: , Rfl:    ketoconazole (NIZORAL) 2 % shampoo, Apply 1 application topically 3 (three) times a week., Disp: , Rfl:    levothyroxine (SYNTHROID) 100 MCG tablet, TAKE 1 TABLET BY MOUTH EVERY DAY, Disp: 90 tablet, Rfl: 2   meloxicam (MOBIC) 7.5 MG tablet, Take 7.5 mg by mouth in the morning and at bedtime., Disp: , Rfl:    mirtazapine (REMERON) 7.5 MG tablet, Take 1 tablet (7.5 mg total) by mouth at bedtime. Will cross taper with reduced dose of Paxil 10mg  x4 weeks., Disp: 30 tablet, Rfl: 0   nitroGLYCERIN (NITROSTAT) 0.4 MG SL tablet, PLACE 1 TABLET (0.4 MG TOTAL) UNDER THE TONGUE EVERY 5 (FIVE) MINUTES AS NEEDED FOR CHEST PAIN., Disp: 25 tablet, Rfl: 2   ondansetron (ZOFRAN ODT) 4 MG disintegrating tablet, Dissolve 1 tablet in mouth every 8 hours as needed for nausea/ vomiting, Disp: 60 tablet, Rfl: 0   OVER THE COUNTER MEDICATION, Calmosepatine ointment -use as directed, Disp: , Rfl:    pantoprazole (PROTONIX) 40 MG tablet, Take 1 tablet (40 mg total) by mouth 2 (two) times daily., Disp: 180 tablet, Rfl: 3   PARoxetine (PAXIL) 20 MG tablet, Take 0.5 tablets (10 mg total) by mouth daily for 28 days., Disp: 90 tablet, Rfl: 0   promethazine (PHENERGAN) 25 MG tablet, Take 0.5-1 tablets (12.5-25 mg total) by mouth 2 (two) times daily as needed for nausea or vomiting., Disp:  45 tablet, Rfl: 1  Current Facility-Administered Medications:    cyanocobalamin ((VITAMIN B-12)) injection 1,000 mcg, 1,000 mcg, Intramuscular, Weekly, Josely Moffat M, DO, 1,000 mcg at 11/21/20 1059   cyanocobalamin ((VITAMIN B-12)) injection 1,000 mcg, 1,000 mcg, Intramuscular, Q30 days, Betti Goodenow M, DO, 1,000 mcg at 04/24/21 3557 Social History   Socioeconomic History   Marital status: Married  Spouse name: Not on file   Number of children: 3   Years of education: Not on file   Highest education level: Not on file  Occupational History   Occupation: [employer]  Tobacco Use   Smoking status: Former    Packs/day: 1.00    Years: 20.00    Pack years: 20.00    Types: Cigarettes    Quit date: 11/28/2006    Years since quitting: 14.4   Smokeless tobacco: Never  Vaping Use   Vaping Use: Never used  Substance and Sexual Activity   Alcohol use: Not Currently    Alcohol/week: 0.0 standard drinks    Comment: rarely   Drug use: No   Sexual activity: Not Currently  Other Topics Concern   Not on file  Social History Narrative   Lives at home with wife, not working disabled from the city of [place]   They have an adopted son at home (age 52  2022)   Former smoker no alcohol tobacco or drug use now   Social Determinants of Radio broadcast assistant Strain: Not on file  Food Insecurity: Not on file  Transportation Needs: Not on file  Physical Activity: Not on file  Stress: Not on file  Social Connections: Not on file  Intimate Partner Violence: Not on file   Family History  Problem Relation Age of Onset   Heart failure Mother 48       Died age 50   Congestive Heart Failure Mother    Heart attack Father 43       Died with MI   Heart attack Paternal Grandmother    Heart attack Paternal Grandfather    Colon cancer Neg Hx     Objective: Office vital signs reviewed. BP 126/79   Pulse 71   Temp 97.8 F (36.6 C)   Ht 5' 7.5" (1.715 m)    Wt 211 lb 12.8 oz (96.1 kg)   SpO2 96%   BMI 32.68 kg/m   Physical Examination:  General: Awake, alert, well nourished, No acute distress HEENT: Normal, sclera white, MMM Cardio: regular rate and rhythm, S1S2 heard, no murmurs appreciated Pulm: Slight crackles heard in the right lower lung fields.  He has normal work of breathing on room air.  No wheezes.  He coughs intermittently Extremities: warm, well perfused, No edema, cyanosis or clubbing; +2 pulses bilaterally. MSK: antalgic gait. Uses cane for ambulation Psych: mood stable, speech normal. Depression screen [facility] 2/9 05/19/2021 03/22/2021 12/20/2020  Decreased Interest 0 0 0  Down, Depressed, Hopeless 0 0 0  PHQ - 2 Score 0 0 0  Altered sleeping 1 1 -  Tired, decreased energy 1 0 -  Change in appetite 0 0 -  Feeling bad or failure about yourself  0 0 -  Trouble concentrating 1 2 -  Moving slowly or fidgety/restless 0 0 -  Suicidal thoughts 0 0 -  PHQ-9 Score 3 3 -  Difficult doing work/chores Not difficult at all Not difficult at all -  Some recent data might be hidden    Assessment/ Plan: 60 y.o. male   1. GAD (generalized anxiety disorder) Resume paxil 20mg .  Consider increase to 30mg  if needed going forward - PARoxetine (PAXIL) 20 MG tablet; Take 1 tablet (20 mg total) by mouth daily for 28 days.  Dispense: 90 tablet; Refill: 3  2. B12 deficiency B12 shot administered.  3. Acute bacterial bronchitis RLL infiltrate vs atelectasis.  ZPak. Caution  arrhythmia given use of Paxil.  Tessalon perles.  If worsening or no significant improvement over weekend. Favor being seen in [place] [facility] for PCR test and CXR. - azithromycin (ZITHROMAX) 250 MG tablet; Take 2 tablets today, then take 1 tablet daily until gone.  Dispense: 6 tablet; Refill: 0 - benzonatate (TESSALON) 200 MG capsule; Take 1 capsule (200 mg total) by mouth 2 (two) times daily as needed for cough.  Dispense: 20 capsule; Refill: 0   No orders of the defined types  were placed in this encounter.  No orders of the defined types were placed in this encounter.    Janora Norlander, DO [place] [phone]

## 2021-05-19 NOTE — Patient Instructions (Addendum)
If no significant improvement over weekend on antibiotic, call us to come in for COVID19 send off testing which is more accurate than the home tests.   Acute Bronchitis, Adult Acute bronchitis is when air tubes in the lungs (bronchi) suddenly get swollen. The condition can make it hard for you to breathe. In adults, acute bronchitis usually goes away within 2 weeks. A cough caused by bronchitis may last up to 3 weeks. Smoking, allergies, and asthma can make the condition worse. What are the causes? This condition is caused by: Cold and flu viruses. The most common cause of this condition is the virus that causes the common cold. Bacteria. Substances that irritate the lungs, including: Smoke from cigarettes and other types of tobacco. Dust and pollen. Fumes from chemicals, gases, or burned fuel. Other materials that pollute indoor or outdoor air. Close contact with someone who has acute bronchitis. What increases the risk? The following factors may make you more likely to develop this condition: A weak body's defense system. This is also called the immune system. Any condition that affects your lungs and breathing, such as asthma. What are the signs or symptoms? Symptoms of this condition include: A cough. Coughing up clear, yellow, or green mucus. Wheezing. Having too much mucus in your lungs (chest congestion). Shortness of breath. A fever. Chills. Body aches. A sore throat. How is this treated? Acute bronchitis may go away over time without treatment. Your doctor may recommend: Drinking more fluids. Using a device that gets medicine into your lungs (inhaler). Using a vaporizer or a humidifier. These are machines that add water or moisture to the air. This helps with coughing and poor breathing. Taking a medicine for fever. Taking a medicine that thins mucus and clears congestion. Taking a medicine that prevents or stops coughing. Follow these instructions at  home: Activity Get a lot of rest. Return to your normal activities as told by your doctor. Ask your doctor what activities are safe for you. Lifestyle  Drink enough fluid to keep your pee (urine) pale yellow. Do not drink alcohol. Do not use any products that contain nicotine or tobacco, such as cigarettes, e-cigarettes, and chewing tobacco. If you need help quitting, ask your doctor. Be aware that: Your bronchitis will get worse if you smoke or breathe in other people's smoke (secondhand smoke). Your lungs will heal faster if you quit smoking. General instructions Take over-the-counter and prescription medicines only as told by your doctor. Use an inhaler, cool mist vaporizer, or humidifier as told by your doctor. Rinse your mouth often with salt water. To make salt water, dissolve -1 tsp (3-6 g) of salt in 1 cup (237 mL) of warm water. Take two teaspoons of honey at bedtime. This helps lessen your coughing at night. Keep all follow-up visits as told by your doctor. This is important. How is this prevented? To lower your risk of getting this condition again: Wash your hands often with soap and water. If you cannot use soap and water, use hand sanitizer. Avoid contact with people who have cold symptoms. Try not to touch your mouth, nose, or eyes with your hands. Make sure to get the flu shot every year. Contact a doctor if: Your symptoms do not get better in 2 weeks. You vomit more than once or twice. You have symptoms of loss of fluid from your body (dehydration). These include: Dark pee. Dry skin or eyes. Increased thirst. Headaches. Confusion. Muscle cramps. Get help right away if: You cough up  blood. You have chest pain. You have very bad shortness of breath. You become dehydrated. You faint or keep feeling like you are going to faint. You have a very bad headache. Your fever or chills get worse. These symptoms may be an emergency. Get help right away. Call your local  emergency services (911 in the [place] Do not wait to see if the symptoms will go away. Do not drive yourself to the hospital. Summary Acute bronchitis is when air tubes in the lungs (bronchi) suddenly get swollen. In adults, acute bronchitis usually goes away within 2 weeks. Take over-the-counter and prescription medicines only as told by your doctor. Drink enough fluid to keep your pee (urine) pale yellow. Contact a doctor if your symptoms do not improve after 2 weeks of treatment. Get help right away if you cough up blood, faint, or have chest pain or shortness of breath. This information is not intended to replace advice given to you by your health care provider. Make sure you discuss any questions you have with your health care provider. Document Revised: 06/22/2020 Document Reviewed: 02/13/2019 Elsevier Patient Education  [place].

## 2021-05-22 ENCOUNTER — Other Ambulatory Visit: Payer: Self-pay

## 2021-05-22 ENCOUNTER — Ambulatory Visit (INDEPENDENT_AMBULATORY_CARE_PROVIDER_SITE_OTHER): Payer: Medicare Other | Admitting: Family Medicine

## 2021-05-22 ENCOUNTER — Encounter: Payer: Self-pay | Admitting: Family Medicine

## 2021-05-22 ENCOUNTER — Telehealth: Payer: Self-pay | Admitting: Family Medicine

## 2021-05-22 DIAGNOSIS — R0989 Other specified symptoms and signs involving the circulatory and respiratory systems: Secondary | ICD-10-CM | POA: Diagnosis not present

## 2021-05-22 DIAGNOSIS — R0602 Shortness of breath: Secondary | ICD-10-CM | POA: Diagnosis not present

## 2021-05-22 DIAGNOSIS — R051 Acute cough: Secondary | ICD-10-CM

## 2021-05-22 MED ORDER — ALBUTEROL SULFATE HFA 108 (90 BASE) MCG/ACT IN AERS: 2.0000 | INHALATION_SPRAY | Freq: Four times a day (QID) | RESPIRATORY_TRACT | 1 refills | Status: DC | PRN

## 2021-05-22 MED ORDER — METHYLPREDNISOLONE 4 MG PO TBPK: ORAL_TABLET | ORAL | 0 refills | Status: DC

## 2021-05-22 MED ORDER — DEXTROMETHORPHAN-GUAIFENESIN 10-100 MG/5ML PO LIQD: 5.0000 mL | ORAL | 0 refills | Status: DC | PRN

## 2021-05-22 NOTE — Telephone Encounter (Signed)
If his symptoms are worsening, he needs to be see for COVID testing/ CXR etc.  Please schedule him accordingly.

## 2021-05-22 NOTE — Telephone Encounter (Signed)
  Incoming Patient Call  05/22/2021  What symptoms do you have? Cough no better, tightness in chest and hurts from coughing so much. Wants a a stronger antibiotic  How long have you been sick? Week and half  Have you been seen for this problem? yes  If your provider decides to give you a prescription, which pharmacy would you like for it to be sent to? [facility] in [place].   Patient informed that this information will be sent to the clinical staff for review and that they should receive a follow up call.

## 2021-05-22 NOTE — Telephone Encounter (Signed)
Pt scheduled  

## 2021-05-22 NOTE — Progress Notes (Signed)
Virtual Visit via Telephone Note  I connected with Jeffrey Hooper on 05/22/21 at 12:51 PM by telephone and verified that I am speaking with the correct person using two identifiers. Jeffrey Hooper is currently located at home and nobody is currently with him during this visit. The provider, Loman Brooklyn, FNP is located in their office at time of visit.  I discussed the limitations, risks, security and privacy concerns of performing an evaluation and management service by telephone and the availability of in person appointments. I also discussed with the patient that there may be a patient responsible charge related to this service. The patient expressed understanding and agreed to proceed.  Subjective: PCP: Janora Norlander, DO  Chief Complaint  Patient presents with   URI   Patient complains of cough, nausea, and chest tightness . Additional symptoms include shortness of breath. Onset of symptoms was  1.5  weeks ago, gradually worsening since that time. He is drinking plenty of fluids. Evaluation to date: previously seen and treated with a Z-Pak and tessalon perles. He does not smoke.    ROS: Per HPI  Current Outpatient Medications:    acetaminophen (TYLENOL) 500 MG tablet, Take 1,000 mg by mouth daily as needed for mild pain or headache. , Disp: , Rfl:    amLODipine (NORVASC) 10 MG tablet, Take 1 tablet (10 mg total) by mouth daily., Disp: 90 tablet, Rfl: 3   aspirin EC 81 MG tablet, Take 81 mg by mouth at bedtime., Disp: , Rfl:    atorvastatin (LIPITOR) 80 MG tablet, TAKE 1 TABLET BY MOUTH EVERYDAY AT BEDTIME, Disp: 90 tablet, Rfl: 0   azithromycin (ZITHROMAX) 250 MG tablet, Take 2 tablets today, then take 1 tablet daily until gone., Disp: 6 tablet, Rfl: 0   benzonatate (TESSALON) 200 MG capsule, Take 1 capsule (200 mg total) by mouth 2 (two) times daily as needed for cough., Disp: 20 capsule, Rfl: 0   cetirizine (ZYRTEC) 10 MG tablet, Take 10 mg by mouth daily., Disp: , Rfl:     diphenhydrAMINE (BENADRYL) 25 MG tablet, Take 25 mg by mouth 2 (two) times daily as needed for itching. , Disp: , Rfl:    EPINEPHRINE 0.3 mg/0.3 mL IJ SOAJ injection, INJECT 0.3 MLS (0.3 MG TOTAL) INTO THE MUSCLE AS NEEDED FOR ANAPHYLAXIS., Disp: 2 each, Rfl: 3   fluticasone (FLONASE) 50 MCG/ACT nasal spray, Place 2 sprays into both nostrils daily., Disp: 48 g, Rfl: 1   hydrochlorothiazide (HYDRODIURIL) 50 MG tablet, TAKE 1 TABLET BY MOUTH EVERY DAY, Disp: 90 tablet, Rfl: 1   HYDROcodone-acetaminophen (NORCO/VICODIN) 5-325 MG tablet, Take 1 tablet by mouth every 6 (six) hours as needed for moderate pain. for pain, Disp: , Rfl:    ketoconazole (NIZORAL) 2 % shampoo, Apply 1 application topically 3 (three) times a week., Disp: , Rfl:    levothyroxine (SYNTHROID) 100 MCG tablet, TAKE 1 TABLET BY MOUTH EVERY DAY, Disp: 90 tablet, Rfl: 2   meloxicam (MOBIC) 7.5 MG tablet, Take 7.5 mg by mouth in the morning and at bedtime., Disp: , Rfl:    nitroGLYCERIN (NITROSTAT) 0.4 MG SL tablet, PLACE 1 TABLET (0.4 MG TOTAL) UNDER THE TONGUE EVERY 5 (FIVE) MINUTES AS NEEDED FOR CHEST PAIN., Disp: 25 tablet, Rfl: 2   ondansetron (ZOFRAN ODT) 4 MG disintegrating tablet, Dissolve 1 tablet in mouth every 8 hours as needed for nausea/ vomiting, Disp: 60 tablet, Rfl: 0   OVER THE COUNTER MEDICATION, Calmosepatine ointment -use as directed,  Disp: , Rfl:    pantoprazole (PROTONIX) 40 MG tablet, Take 1 tablet (40 mg total) by mouth 2 (two) times daily., Disp: 180 tablet, Rfl: 3   PARoxetine (PAXIL) 20 MG tablet, Take 1 tablet (20 mg total) by mouth daily for 28 days., Disp: 90 tablet, Rfl: 3   promethazine (PHENERGAN) 25 MG tablet, Take 0.5-1 tablets (12.5-25 mg total) by mouth 2 (two) times daily as needed for nausea or vomiting., Disp: 45 tablet, Rfl: 1  Current Facility-Administered Medications:    cyanocobalamin ((VITAMIN B-12)) injection 1,000 mcg, 1,000 mcg, Intramuscular, Weekly, Gottschalk, Ashly M, DO, 1,000 mcg  at 11/21/20 1059   cyanocobalamin ((VITAMIN B-12)) injection 1,000 mcg, 1,000 mcg, Intramuscular, Q30 days, Gottschalk, Ashly M, DO, 1,000 mcg at 04/24/21 9702  Allergies  Allergen Reactions   Alpha-Gal Rash and Other (See Comments)    SEVERE ALLERGY TO ANY MEAT OR MEAT DERIVED PRODUCTS FROM 4 LEGGED ANIMALS > > BEEF, PORK , GOATS, DEER, ETC. < < RESULT OF BITE FROM LONE STAR TICK   Beef (Bovine) Protein Rash and Other (See Comments)   Beef-Derived Products Rash and Other (See Comments)   Lambs Quarters Rash and Other (See Comments)   Pork-Derived Products Rash and Other (See Comments)   Prednisone Other (See Comments)    im depo medrol caused dizziness - pt passed out. Ask pt before giving. Pt can take oral prednisone    Brilinta [Ticagrelor] Hives    Pt ate pork on the same day he took Brilinta before knowing he had alpha gal. May have been the alpha gal reaction, pt is unsure.    Doxycycline Hives   Gabapentin Swelling   Lyrica [Pregabalin]     Dry mouth and felt raw   Methylprednisolone Sodium Succ     im depo medrol caused dizziness - pt passed out. Ask pt before giving. Issue occurred when administered in the left arm    Penicillins Hives    Did it involve swelling of the face/tongue/throat, SOB, or low BP? No Did it involve sudden or severe rash/hives, skin peeling, or any reaction on the inside of your mouth or nose? No Did you need to seek medical attention at a hospital or doctor's office? No When did it last happen?      10 + years ago If all above answers are "NO", may proceed with cephalosporin use.    Shellfish Allergy Hives   Adhesive [Tape] Rash   Fentanyl Rash    Reaction to adhesive, not the drug    Past Medical History:  Diagnosis Date   Allergy    Allergy to alpha-gal    Anxiety    Arthritis    CAD (coronary artery disease), native coronary artery 12/03/2015   Coronary artery disease    Dysrhythmia    "irregular and PAC'S"   GERD (gastroesophageal  reflux disease)    Head injury with loss of consciousness ([place])    back in the 1980's   History of kidney stones    Hyperlipidemia    Hypertension    Hypothyroid    Multiple benign nevi    Neuromuscular disorder ([place]) 2011   LEFT RADIAL NERVE SURGERY R/T TRAUMA   Pneumonia 2014, 2017   Prostatism    Pulmonary nodule    Spinal cord stimulator status    told to bring remote to surgery    Observations/Objective: A&O  No respiratory distress or wheezing audible over the phone Mood, judgement, and thought processes all WNL  Assessment  and Plan: 1. Acute cough - methylPREDNISolone (MEDROL DOSEPAK) 4 MG TBPK tablet; Use as directed.  Dispense: 21 each; Refill: 0 - Novel Coronavirus, NAA ([facility]); Future - dextromethorphan-guaiFENesin (ROBITUSSIN-DM) 10-100 MG/5ML liquid; Take 5 mLs by mouth every 4 (four) hours as needed for cough.  Dispense: 180 mL; Refill: 0  2. Shortness of breath - albuterol (VENTOLIN HFA) 108 (90 Base) MCG/ACT inhaler; Inhale 2 puffs into the lungs every 6 (six) hours as needed for wheezing or shortness of breath.  Dispense: 18 g; Refill: 1 - methylPREDNISolone (MEDROL DOSEPAK) 4 MG TBPK tablet; Use as directed.  Dispense: 21 each; Refill: 0  3. Chest congestion - methylPREDNISolone (MEDROL DOSEPAK) 4 MG TBPK tablet; Use as directed.  Dispense: 21 each; Refill: 0   Follow Up Instructions:  I discussed the assessment and treatment plan with the patient. The patient was provided an opportunity to ask questions and all were answered. The patient agreed with the plan and demonstrated an understanding of the instructions.   The patient was advised to call back or seek an in-person evaluation if the symptoms worsen or if the condition fails to improve as anticipated.  The above assessment and management plan was discussed with the patient. The patient verbalized understanding of and has agreed to the management plan. Patient is aware to call the clinic if symptoms  persist or worsen. Patient is aware when to return to the clinic for a follow-up visit. Patient educated on when it is appropriate to go to the emergency department.   Time call ended: 1:04 PM  I provided 13 minutes of non-face-to-face time during this encounter.  Hendricks Limes, MSN, APRN, FNP-C [facility] 05/22/21

## 2021-05-23 DIAGNOSIS — E538 Deficiency of other specified B group vitamins: Secondary | ICD-10-CM | POA: Diagnosis not present

## 2021-05-23 LAB — SARS-COV-2, NAA 2 DAY TAT

## 2021-05-23 LAB — NOVEL CORONAVIRUS, NAA: SARS-CoV-2, NAA: NOT DETECTED

## 2021-05-25 ENCOUNTER — Telehealth: Payer: Self-pay | Admitting: Family Medicine

## 2021-05-25 NOTE — Telephone Encounter (Signed)
Patient aware and verbalizes understanding. 

## 2021-05-25 NOTE — Telephone Encounter (Signed)
  Incoming Patient Call  05/25/2021  What symptoms do you have? Cough congestion no better finished abx   How long have you been sick? 05/22/2021  Have you been seen for this problem? yes  If your provider decides to give you a prescription, which pharmacy would you like for it to be sent to? [facility]   Patient informed that this information will be sent to the clinical staff for review and that they should receive a follow up call.

## 2021-05-25 NOTE — Telephone Encounter (Signed)
He has not yet finished the steroids, but if he feels he is worsening or not improving I recommend an in-person office visit for proper assessment. He may possibly need an x-ray depending on that assessment and I know we don't have that today; I guess he could return for that tomorrow if it is deemed necessary to perform one.

## 2021-05-25 NOTE — Telephone Encounter (Signed)
Please advise patient seen you for this prob

## 2021-06-01 ENCOUNTER — Other Ambulatory Visit: Payer: Self-pay | Admitting: Family Medicine

## 2021-06-01 DIAGNOSIS — F411 Generalized anxiety disorder: Secondary | ICD-10-CM

## 2021-06-20 ENCOUNTER — Other Ambulatory Visit: Payer: Self-pay

## 2021-06-20 ENCOUNTER — Ambulatory Visit (INDEPENDENT_AMBULATORY_CARE_PROVIDER_SITE_OTHER): Payer: Medicare Other | Admitting: *Deleted

## 2021-06-20 DIAGNOSIS — E538 Deficiency of other specified B group vitamins: Secondary | ICD-10-CM

## 2021-06-20 NOTE — Progress Notes (Signed)
Tolerated B12 injection well to the left deltoid

## 2021-07-11 ENCOUNTER — Ambulatory Visit (INDEPENDENT_AMBULATORY_CARE_PROVIDER_SITE_OTHER): Payer: Medicare Other

## 2021-07-11 VITALS — Ht 68.0 in | Wt 208.0 lb

## 2021-07-11 DIAGNOSIS — R600 Localized edema: Secondary | ICD-10-CM | POA: Insufficient documentation

## 2021-07-11 DIAGNOSIS — Z0000 Encounter for general adult medical examination without abnormal findings: Secondary | ICD-10-CM | POA: Diagnosis not present

## 2021-07-11 NOTE — Patient Instructions (Addendum)
Jeffrey Hooper , Thank you for taking time to come for your Medicare Wellness Visit. I appreciate your ongoing commitment to your health goals. Please review the following plan we discussed and let me know if I can assist you in the future.   Screening recommendations/referrals: Colonoscopy: Done 12/13/2020 - Repeat in 10 years Recommended yearly ophthalmology/optometry visit for glaucoma screening and checkup Recommended yearly dental visit for hygiene and checkup  Vaccinations: Influenza vaccine: Done 04/24/2021 - Repeat annually  Pneumococcal vaccine: May have done at [facility] - please get records and let us know Tdap vaccine: Done 05/16/2020 - Repeat in 10 years  Shingles vaccine: Done 08/08/2019 & 5/13/221   Covid-19: Jeffrey Hooper 01/05/2020 and Moderna booster 07/31/2020  Advanced directives: Advance directive discussed with you today. Even though you declined this today, please call our office should you change your mind, and we can give you the proper paperwork for you to fill out.   Conditions/risks identified: Aim for 30 minutes of exercise or brisk walking each day, drink 6-8 glasses of water and eat lots of fruits and vegetables.   Next appointment: Follow up in one year for your annual wellness visit   Preventive Care 40-64 Years, Male Preventive care refers to lifestyle choices and visits with your health care provider that can promote health and wellness. What does preventive care include? A yearly physical exam. This is also called an annual well check. Dental exams once or twice a year. Routine eye exams. Ask your health care provider how often you should have your eyes checked. Personal lifestyle choices, including: Daily care of your teeth and gums. Regular physical activity. Eating a healthy diet. Avoiding tobacco and drug use. Limiting alcohol use. Practicing safe sex. Taking low-dose aspirin every day starting at age 12. What happens during an annual well check? The  services and screenings done by your health care provider during your annual well check will depend on your age, overall health, lifestyle risk factors, and family history of disease. Counseling  Your health care provider may ask you questions about your: Alcohol use. Tobacco use. Drug use. Emotional well-being. Home and relationship well-being. Sexual activity. Eating habits. Work and work Statistician. Screening  You may have the following tests or measurements: Height, weight, and BMI. Blood pressure. Lipid and cholesterol levels. These may be checked every 5 years, or more frequently if you are over 12 years old. Skin check. Lung cancer screening. You may have this screening every year starting at age 54 if you have a 30-pack-year history of smoking and currently smoke or have quit within the past 15 years. Fecal occult blood test (FOBT) of the stool. You may have this test every year starting at age 40. Flexible sigmoidoscopy or colonoscopy. You may have a sigmoidoscopy every 5 years or a colonoscopy every 10 years starting at age 43. Prostate cancer screening. Recommendations will vary depending on your family history and other risks. Hepatitis C blood test. Hepatitis B blood test. Sexually transmitted disease (STD) testing. Diabetes screening. This is done by checking your blood sugar (glucose) after you have not eaten for a while (fasting). You may have this done every 1-3 years. Discuss your test results, treatment options, and if necessary, the need for more tests with your health care provider. Vaccines  Your health care provider may recommend certain vaccines, such as: Influenza vaccine. This is recommended every year. Tetanus, diphtheria, and acellular pertussis (Tdap, Td) vaccine. You may need a Td booster every 10 years. Zoster vaccine.  You may need this after age 49. Pneumococcal 13-valent conjugate (PCV13) vaccine. You may need this if you have certain conditions and  have not been vaccinated. Pneumococcal polysaccharide (PPSV23) vaccine. You may need one or two doses if you smoke cigarettes or if you have certain conditions. Talk to your health care provider about which screenings and vaccines you need and how often you need them. This information is not intended to replace advice given to you by your health care provider. Make sure you discuss any questions you have with your health care provider. Document Released: 08/19/2015 Document Revised: 04/11/2016 Document Reviewed: 05/24/2015 Elsevier Interactive Patient Education  2017 [place] Prevention in the Home Falls can cause injuries. They can happen to people of all ages. There are many things you can do to make your home safe and to help prevent falls. What can I do on the outside of my home? Regularly fix the edges of walkways and driveways and fix any cracks. Remove anything that might make you trip as you walk through a door, such as a raised step or threshold. Trim any bushes or trees on the path to your home. Use bright outdoor lighting. Clear any walking paths of anything that might make someone trip, such as rocks or tools. Regularly check to see if handrails are loose or broken. Make sure that both sides of any steps have handrails. Any raised decks and porches should have guardrails on the edges. Have any leaves, snow, or ice cleared regularly. Use sand or salt on walking paths during winter. Clean up any spills in your garage right away. This includes oil or grease spills. What can I do in the bathroom? Use night lights. Install grab bars by the toilet and in the tub and shower. Do not use towel bars as grab bars. Use non-skid mats or decals in the tub or shower. If you need to sit down in the shower, use a plastic, non-slip stool. Keep the floor dry. Clean up any water that spills on the floor as soon as it happens. Remove soap buildup in the tub or shower regularly. Attach  bath mats securely with double-sided non-slip rug tape. Do not have throw rugs and other things on the floor that can make you trip. What can I do in the bedroom? Use night lights. Make sure that you have a light by your bed that is easy to reach. Do not use any sheets or blankets that are too big for your bed. They should not hang down onto the floor. Have a firm chair that has side arms. You can use this for support while you get dressed. Do not have throw rugs and other things on the floor that can make you trip. What can I do in the kitchen? Clean up any spills right away. Avoid walking on wet floors. Keep items that you use a lot in easy-to-reach places. If you need to reach something above you, use a strong step stool that has a grab bar. Keep electrical cords out of the way. Do not use floor polish or wax that makes floors slippery. If you must use wax, use non-skid floor wax. Do not have throw rugs and other things on the floor that can make you trip. What can I do with my stairs? Do not leave any items on the stairs. Make sure that there are handrails on both sides of the stairs and use them. Fix handrails that are broken or loose. Make sure  that handrails are as long as the stairways. Check any carpeting to make sure that it is firmly attached to the stairs. Fix any carpet that is loose or worn. Avoid having throw rugs at the top or bottom of the stairs. If you do have throw rugs, attach them to the floor with carpet tape. Make sure that you have a light switch at the top of the stairs and the bottom of the stairs. If you do not have them, ask someone to add them for you. What else can I do to help prevent falls? Wear shoes that: Do not have high heels. Have rubber bottoms. Are comfortable and fit you well. Are closed at the toe. Do not wear sandals. If you use a stepladder: Make sure that it is fully opened. Do not climb a closed stepladder. Make sure that both sides of the  stepladder are locked into place. Ask someone to hold it for you, if possible. Clearly mark and make sure that you can see: Any grab bars or handrails. First and last steps. Where the edge of each step is. Use tools that help you move around (mobility aids) if they are needed. These include: Canes. Walkers. Scooters. Crutches. Turn on the lights when you go into a dark area. Replace any light bulbs as soon as they burn out. Set up your furniture so you have a clear path. Avoid moving your furniture around. If any of your floors are uneven, fix them. If there are any pets around you, be aware of where they are. Review your medicines with your doctor. Some medicines can make you feel dizzy. This can increase your chance of falling. Ask your doctor what other things that you can do to help prevent falls. This information is not intended to replace advice given to you by your health care provider. Make sure you discuss any questions you have with your health care provider. Document Released: 05/19/2009 Document Revised: 12/29/2015 Document Reviewed: 08/27/2014 Elsevier Interactive Patient Education  2017 [employer]   Managing Pain Without Opioids Opioids are strong medicines used to treat moderate to severe pain. For some people, especially those who have long-term (chronic) pain, opioids may not be the best choice for pain management due to: Side effects like nausea, constipation, and sleepiness. The risk of addiction (opioid use disorder). The longer you take opioids, the greater your risk of addiction. Pain that lasts for more than 3 months is called chronic pain. Managing chronic pain usually requires more than one approach and is often provided by a team of health care providers working together (multidisciplinary approach). Pain management may be done at a pain management center or pain clinic. How to manage pain without the use of opioids Use non-opioid medicines Non-opioid  medicines for pain may include: Over-the-counter or prescription non-steroidal anti-inflammatory drugs (NSAIDs). These may be the first medicines used for pain. They work well for muscle and bone pain, and they reduce swelling. Acetaminophen. This over-the-counter medicine may work well for milder pain but not swelling. Antidepressants. These may be used to treat chronic pain. A certain type of antidepressant (tricyclics) is often used. These medicines are given in lower doses for pain than when used for depression. Anticonvulsants. These are usually used to treat seizures but may also reduce nerve (neuropathic) pain. Muscle relaxants. These relieve pain caused by sudden muscle tightening (spasms). You may also use a pain medicine that is applied to the skin as a patch, cream, or gel (topical analgesic), such as  a numbing medicine. These may cause fewer side effects than medicines taken by mouth. Do certain therapies as directed Some therapies can help with pain management. They include: Physical therapy. You will do exercises to gain strength and flexibility. A physical therapist may teach you exercises to move and stretch parts of your body that are weak, stiff, or painful. You can learn these exercises at physical therapy visits and practice them at home. Physical therapy may also involve: Massage. Heat wraps or applying heat or cold to affected areas. Electrical signals that interrupt pain signals (transcutaneous electrical nerve stimulation, TENS). Weak lasers that reduce pain and swelling (low-level laser therapy). Signals from your body that help you learn to regulate pain (biofeedback). Occupational therapy. This helps you to learn ways to function at home and work with less pain. Recreational therapy. This involves trying new activities or hobbies, such as a physical activity or drawing. Mental health therapy, including: Cognitive behavioral therapy (CBT). This helps you learn coping  skills for dealing with pain. Acceptance and commitment therapy (ACT) to change the way you think and react to pain. Relaxation therapies, including muscle relaxation exercises and mindfulness-based stress reduction. Pain management counseling. This may be individual, family, or group counseling.  Receive medical treatments Medical treatments for pain management include: Nerve block injections. These may include a pain blocker and anti-inflammatory medicines. You may have injections: Near the spine to relieve chronic back or neck pain. Into joints to relieve back or joint pain. Into nerve areas that supply a painful area to relieve body pain. Into muscles (trigger point injections) to relieve some painful muscle conditions. A medical device placed near your spine to help block pain signals and relieve nerve pain or chronic back pain (spinal cord stimulation device). Acupuncture. Follow these instructions at home Medicines Take over-the-counter and prescription medicines only as told by your health care provider. If you are taking pain medicine, ask your health care providers about possible side effects to watch out for. Do not drive or use heavy machinery while taking prescription opioid pain medicine. Lifestyle  Do not use drugs or alcohol to reduce pain. If you drink alcohol, limit how much you have to: 0-1 drink a day for women who are not pregnant. 0-2 drinks a day for men. Know how much alcohol is in a drink. In the [place] one drink equals one 12 oz bottle of beer (355 mL), one 5 oz glass of wine (148 mL), or one 1 oz glass of hard liquor (44 mL). Do not use any products that contain nicotine or tobacco. These products include cigarettes, chewing tobacco, and vaping devices, such as e-cigarettes. If you need help quitting, ask your health care provider. Eat a healthy diet and maintain a healthy weight. Poor diet and excess weight may make pain worse. Eat foods that are high in fiber.  These include fresh fruits and vegetables, whole grains, and beans. Limit foods that are high in fat and processed sugars, such as fried and sweet foods. Exercise regularly. Exercise lowers stress and may help relieve pain. Ask your health care provider what activities and exercises are safe for you. If your health care provider approves, join an exercise class that combines movement and stress reduction. Examples include yoga and tai chi. Get enough sleep. Lack of sleep may make pain worse. Lower stress as much as possible. Practice stress reduction techniques as told by your therapist. General instructions Work with all your pain management providers to find the treatments that work  best for you. You are an important member of your pain management team. There are many things you can do to reduce pain on your own. Consider joining an online or in-person support group for people who have chronic pain. Keep all follow-up visits. This is important. Where to find more information You can find more information about managing pain without opioids from: American Academy of Pain Medicine: painmed.[place] for Chronic Pain: instituteforchronicpain.org American Chronic Pain Association: theacpa.org Contact a health care provider if: You have side effects from pain medicine. Your pain gets worse or does not get better with treatments or home therapy. You are struggling with anxiety or depression. Summary Many types of pain can be managed without opioids. Chronic pain may respond better to pain management without opioids. Pain is best managed when you and a team of health care providers work together. Pain management without opioids may include non-opioid medicines, medical treatments, physical therapy, mental health therapy, and lifestyle changes. Tell your health care providers if your pain gets worse or is not being managed well enough. This information is not intended to replace advice given  to you by your health care provider. Make sure you discuss any questions you have with your health care provider. Document Revised: 11/02/2020 Document Reviewed: 11/02/2020 Elsevier Patient Education  [place].

## 2021-07-11 NOTE — Progress Notes (Signed)
Subjective:   Jeffrey Hooper is a 60 y.o. male who presents for an Initial Medicare Annual Wellness Visit.  Virtual Visit via Telephone Note  I connected with  JAMORIAN DIMARIA on 07/11/21 at  9:00 AM EST by telephone and verified that I am speaking with the correct person using two identifiers.  Location: Patient: Home Provider: [facility] Persons participating in the virtual visit: patient/Nurse Health Advisor   I discussed the limitations, risks, security and privacy concerns of performing an evaluation and management service by telephone and the availability of in person appointments. The patient expressed understanding and agreed to proceed.  Interactive audio and video telecommunications were attempted between this nurse and patient, however failed, due to patient having technical difficulties OR patient did not have access to video capability.  We continued and completed visit with audio only.  Some vital signs may be absent or patient reported.   Tekila Caillouet E Averlee Swartz, LPN   Review of Systems     Cardiac Risk Factors include: obesity (BMI >30kg/m2);sedentary lifestyle;advanced age (>55mn, >>84women);male gender;dyslipidemia;hypertension;family history of premature cardiovascular disease;Other (see comment), Risk factor comments: CAD, pernicious anemia     Objective:    Today's Vitals   07/11/21 0900 07/11/21 0901  Weight: 208 lb (94.3 kg)   Height: 5' 8"  (1.727 m)   PainSc:  6    Body mass index is 31.63 kg/m.  Advanced Directives 07/11/2021 09/01/2020 08/25/2020 10/29/2019 10/27/2019 10/23/2019 10/06/2019  Does Patient Have a Medical Advance Directive? No No No No No No No  Would patient like information on creating a medical advance directive? No - Patient declined No - Patient declined No - Patient declined No - Patient declined No - Patient declined Yes (MAU/Ambulatory/Procedural Areas - Information given) No - Patient declined    Current Medications (verified) Outpatient  Encounter Medications as of 07/11/2021  Medication Sig   acetaminophen (TYLENOL) 500 MG tablet Take 1,000 mg by mouth daily as needed for mild pain or headache.    albuterol (VENTOLIN HFA) 108 (90 Base) MCG/ACT inhaler Inhale 2 puffs into the lungs every 6 (six) hours as needed for wheezing or shortness of breath.   aspirin EC 81 MG tablet Take 81 mg by mouth at bedtime.   atorvastatin (LIPITOR) 80 MG tablet TAKE 1 TABLET BY MOUTH EVERYDAY AT BEDTIME   cetirizine (ZYRTEC) 10 MG tablet Take 10 mg by mouth daily.   diphenhydrAMINE (BENADRYL) 25 MG tablet Take 25 mg by mouth 2 (two) times daily as needed for itching.    fluticasone (FLONASE) 50 MCG/ACT nasal spray Place 2 sprays into both nostrils daily.   hydrochlorothiazide (HYDRODIURIL) 50 MG tablet TAKE 1 TABLET BY MOUTH EVERY DAY   HYDROcodone-acetaminophen (NORCO/VICODIN) 5-325 MG tablet Take 1 tablet by mouth every 6 (six) hours as needed for moderate pain. for pain   ketoconazole (NIZORAL) 2 % shampoo Apply 1 application topically 3 (three) times a week.   levothyroxine (SYNTHROID) 100 MCG tablet TAKE 1 TABLET BY MOUTH EVERY DAY   ondansetron (ZOFRAN ODT) 4 MG disintegrating tablet Dissolve 1 tablet in mouth every 8 hours as needed for nausea/ vomiting   OVER THE COUNTER MEDICATION Calmosepatine ointment -use as directed   pantoprazole (PROTONIX) 40 MG tablet Take 1 tablet (40 mg total) by mouth 2 (two) times daily.   PARoxetine (PAXIL) 20 MG tablet TAKE 1 TABLET BY MOUTH EVERY DAY   promethazine (PHENERGAN) 25 MG tablet Take 0.5-1 tablets (12.5-25 mg total) by mouth 2 (two) times  daily as needed for nausea or vomiting.   amLODipine (NORVASC) 10 MG tablet Take 1 tablet (10 mg total) by mouth daily.   dextromethorphan-guaiFENesin (ROBITUSSIN-DM) 10-100 MG/5ML liquid Take 5 mLs by mouth every 4 (four) hours as needed for cough. (Patient not taking: Reported on 07/11/2021)   EPINEPHRINE 0.3 mg/0.3 mL IJ SOAJ injection INJECT 0.3 MLS (0.3 MG  TOTAL) INTO THE MUSCLE AS NEEDED FOR ANAPHYLAXIS.   meloxicam (MOBIC) 7.5 MG tablet Take 7.5 mg by mouth in the morning and at bedtime. (Patient not taking: Reported on 07/11/2021)   nitroGLYCERIN (NITROSTAT) 0.4 MG SL tablet PLACE 1 TABLET (0.4 MG TOTAL) UNDER THE TONGUE EVERY 5 (FIVE) MINUTES AS NEEDED FOR CHEST PAIN.   [DISCONTINUED] azithromycin (ZITHROMAX) 250 MG tablet Take 2 tablets today, then take 1 tablet daily until gone. (Patient not taking: Reported on 07/11/2021)   [DISCONTINUED] benzonatate (TESSALON) 200 MG capsule Take 1 capsule (200 mg total) by mouth 2 (two) times daily as needed for cough. (Patient not taking: Reported on 07/11/2021)   [DISCONTINUED] methylPREDNISolone (MEDROL DOSEPAK) 4 MG TBPK tablet Use as directed. (Patient not taking: Reported on 07/11/2021)   Facility-Administered Encounter Medications as of 07/11/2021  Medication   cyanocobalamin ((VITAMIN B-12)) injection 1,000 mcg   cyanocobalamin ((VITAMIN B-12)) injection 1,000 mcg    Allergies (verified) Alpha-gal, Beef (bovine) protein, Beef-derived products, Lambs quarters, Pork-derived products, Prednisone, Brilinta [ticagrelor], Doxycycline, Gabapentin, Lyrica [pregabalin], Methylprednisolone sodium succ, Penicillins, Shellfish allergy, Adhesive [tape], and Fentanyl   History: Past Medical History:  Diagnosis Date   Allergy    Allergy to alpha-gal    Anxiety    Arthritis    CAD (coronary artery disease), native coronary artery 12/03/2015   Coronary artery disease    Dysrhythmia    "irregular and PAC'S"   GERD (gastroesophageal reflux disease)    Head injury with loss of consciousness ([place])    back in the 1980's   History of kidney stones    Hyperlipidemia    Hypertension    Hypothyroid    Multiple benign nevi    Neuromuscular disorder ([place]) 2011   LEFT RADIAL NERVE SURGERY R/T TRAUMA   Pneumonia 2014, 2017   Prostatism    Pulmonary nodule    Spinal cord stimulator status    told to bring remote  to surgery   Past Surgical History:  Procedure Laterality Date   BACK SURGERY     T12 - L1, 11 times including neck   CARDIAC CATHETERIZATION N/A 12/02/2015   Procedure: Left Heart Cath and Coronary Angiography;  Surgeon: Peter M Martinique, MD;  Location: [facility];  Service: Cardiovascular;  Laterality: N/A;   CARDIAC CATHETERIZATION N/A 12/02/2015   Procedure: Coronary Stent Intervention;  Surgeon: Peter M Martinique, MD;  Location: [facility];  Service: Cardiovascular;  Laterality: N/A;  mid LAD Promus 2.5x12   COLONOSCOPY     CORONARY ANGIOPLASTY     one stent placed by Dr. P Martinique   HARDWARE REMOVAL Right 08/26/2017   Procedure: Right Lumbar Five Revision of pedicle screw with Removal of Lumbar Five Screw;  Surgeon: Kristeen Miss, MD;  Location: [facility];  Service: Neurosurgery;  Laterality: Right;  posterior   HARDWARE REMOVAL Right 12/27/2017   Procedure: Right Lumbar Two, Lumbar Three, Lumbar Four Pedicle screw removal with metrex;  Surgeon: Kristeen Miss, MD;  Location: [facility];  Service: Neurosurgery;  Laterality: Right;  Right L2 to L4 Pedicle screw removal with mterex   HEMORRHOID BANDING  LAPAROSCOPY N/A 09/01/2020   Procedure: LAPAROSCOPY DIAGNOSTIC;  Surgeon: Leighton Ruff, MD;  Location: [facility];  Service: General;  Laterality: N/A;   MASS EXCISION  06/25/2012   Procedure: EXCISION MASS;  Surgeon: Wynonia Sours, MD;  Location: [facility];  Service: Orthopedics;  Laterality: Left;  transection of NEUROMA, BURYING RADIAL NERVE IN BRACHIORADIALIS LEFT SIDE   radial nerve Left    cut in work injury   RIGHT GREAT TOENAIL REMOVAL  04/2020   SPINAL FUSION     C6-7   TONSILLECTOMY  3149   UMBILICAL HERNIA REPAIR N/A 09/01/2020   Procedure: PRIMARY UMBILICAL HERNIA REPAIR;  Surgeon: Leighton Ruff, MD;  Location: [facility];  Service: General;  Laterality: N/A;   VASECTOMY     Family History  Problem Relation Age of Onset   Heart failure Mother 66        Died age 25   Congestive Heart Failure Mother    Heart attack Father 2       Died with MI   Heart attack Paternal Grandmother    Heart attack Paternal Grandfather    Colon cancer Neg Hx    Social History   Socioeconomic History   Marital status: Married    Spouse name: Not on file   Number of children: 3   Years of education: Not on file   Highest education level: Not on file  Occupational History   Occupation: [employer]  Tobacco Use   Smoking status: Former    Packs/day: 1.00    Years: 20.00    Pack years: 20.00    Types: Cigarettes    Quit date: 11/28/2006    Years since quitting: 14.6   Smokeless tobacco: Never  Vaping Use   Vaping Use: Never used  Substance and Sexual Activity   Alcohol use: Not Currently    Alcohol/week: 0.0 standard drinks    Comment: rarely   Drug use: No   Sexual activity: Not Currently  Other Topics Concern   Not on file  Social History Narrative   Lives at home with wife, not working disabled from the city of [place]   They have an adopted son at home (age 21  2022)   Former smoker no alcohol tobacco or drug use now   Social Determinants of Radio broadcast assistant Strain: Low Risk    Difficulty of Paying Living Expenses: Not hard at all  Food Insecurity: No Food Insecurity   Worried About Charity fundraiser in the Last Year: Never true   Arboriculturist in the Last Year: Never true  Transportation Needs: No Transportation Needs   Lack of Transportation (Medical): No   Lack of Transportation (Non-Medical): No  Physical Activity: Insufficiently Active   Days of Exercise per Week: 7 days   Minutes of Exercise per Session: 20 min  Stress: No Stress Concern Present   Feeling of Stress : Not at all  Social Connections: Socially Integrated   Frequency of Communication with Friends and Family: More than three times a week   Frequency of Social Gatherings with Friends and Family: More than three times a week    Attends Religious Services: More than 4 times per year   Active Member of [employer] or Organizations: Yes   Attends Music therapist: More than 4 times per year   Marital Status: Married    Tobacco Counseling Counseling given: Not Answered   Clinical Intake:  Pre-visit preparation completed: Yes  Pain : 0-10 Pain Score: 6  Pain Type: Chronic pain Pain Location: Back Pain Orientation: Mid Pain Descriptors / Indicators: Aching, Tightness, Spasm, Discomfort Pain Onset: More than a month ago Pain Frequency: Intermittent     BMI - recorded: 31.63 Nutritional Status: BMI > 30  Obese Nutritional Risks: None Diabetes: No  How often do you need to have someone help you when you read instructions, pamphlets, or other written materials from your doctor or pharmacy?: 1 - Never  Diabetic? no  Interpreter Needed?: No  Information entered by :: Deena Shaub, LPN   Activities of Daily Living In your present state of health, do you have any difficulty performing the following activities: 07/11/2021 08/25/2020  Hearing? Y N  Comment wears hearing aids -  Vision? N N  Difficulty concentrating or making decisions? N N  Walking or climbing stairs? Y N  Comment can do this slowly and holding rail -  Dressing or bathing? N N  Doing errands, shopping? N N  Preparing Food and eating ? N -  Using the Toilet? N -  In the past six months, have you accidently leaked urine? N -  Do you have problems with loss of bowel control? N -  Managing your Medications? N -  Managing your Finances? N -  Housekeeping or managing your Housekeeping? N -  Some recent data might be hidden    Patient Care Team: Janora Norlander, DO as PCP - General (Family Medicine) Minus Breeding, MD as PCP - Cardiology (Cardiology) Milus Banister, MD as Attending Physician (Gastroenterology) Kristeen Miss, MD as Consulting Physician (Neurosurgery) Celestia Khat, OD (Optometry)  Indicate any recent  Medical Services you may have received from other than [facility] providers in the past year (date may be approximate).     Assessment:   This is a routine wellness examination for Soren.  Hearing/Vision screen Hearing Screening - Comments:: Wears hearing aids - from [employer] - prescribed from [place] - Comments:: Wears rx glasses - up to date with annual eye exams with [facility]  Dietary issues and exercise activities discussed: Current Exercise Habits: Home exercise routine, Type of exercise: walking, Time (Minutes): 20, Frequency (Times/Week): 7, Weekly Exercise (Minutes/Week): 140, Intensity: Mild, Exercise limited by: orthopedic condition(s);cardiac condition(s)   Goals Addressed             This Visit's Progress    Prevent falls         Depression Screen PHQ 2/9 Scores 07/11/2021 05/19/2021 03/22/2021 12/20/2020 09/21/2020 05/16/2020 05/16/2020  PHQ - 2 Score 0 0 0 0 0 0 0  PHQ- 9 Score 3 3 3  - 0 3 -  Exception Documentation - - - - - - -    Fall Risk Fall Risk  07/11/2021 05/19/2021 03/22/2021 12/20/2020 05/16/2020  Falls in the past year? 0 0 0 0 0  Number falls in past yr: 0 - - - -  Injury with Fall? 0 - - - -  Risk for fall due to : Impaired balance/gait;Orthopedic patient;Medication side effect - - - -  Risk for fall due to: Comment - - - - -  Follow up Falls prevention discussed - - - -    FALL RISK PREVENTION PERTAINING TO THE HOME:  Any stairs in or around the home? No  If so, are there any without handrails? No  Home free of loose throw rugs in walkways, pet beds, electrical cords, etc? Yes  Adequate  lighting in your home to reduce risk of falls? Yes   ASSISTIVE DEVICES UTILIZED TO PREVENT FALLS:  Life alert? No  Use of a cane, walker or w/c? Yes  Grab bars in the bathroom? No  Shower chair or bench in shower? Yes  Elevated toilet seat or a handicapped toilet? No   TIMED UP AND GO:  Was the test performed? No . Telephonic  visit  Cognitive Function:     6CIT Screen 07/11/2021  What Year? 0 points  What month? 0 points  What time? 0 points  Count back from 20 0 points  Months in reverse 0 points  Repeat phrase 0 points  Total Score 0    Immunizations Immunization History  Administered Date(s) Administered   Influenza Inj Mdck Quad Pf 05/01/2018   Influenza Whole 05/02/2012   Influenza,inj,Quad PF,6+ Mos 04/28/2013, 05/18/2015, 05/06/2017, 04/21/2019, 04/24/2021   Influenza-Unspecified 04/27/2014, 05/14/2016   Janssen (J&J) SARS-COV-2 Vaccination 01/05/2020   MMR 09/29/1990   Moderna SARS-COV2 Booster Vaccination 07/31/2020   Moderna Sars-Covid-2 Vaccination 07/31/2020   Td 04/12/2010   Tdap 05/16/2020   Zoster Recombinat (Shingrix) 08/08/2019, 12/17/2019    TDAP status: Up to date  Flu Vaccine status: Up to date  Pneumococcal vaccine status: Due, Education has been provided regarding the importance of this vaccine. Advised may receive this vaccine at local pharmacy or Health Dept. Aware to provide a copy of the vaccination record if obtained from local pharmacy or Health Dept. Verbalized acceptance and understanding.  Covid-19 vaccine status: Completed vaccines  Qualifies for Shingles Vaccine? Yes   Zostavax completed Yes   Shingrix Completed?: Yes  Screening Tests Health Maintenance  Topic Date Due   COVID-19 Vaccine (3 - Booster for Janssen series) 09/25/2020   Pneumococcal Vaccine 37-31 Years old (1 - PCV) 03/22/2022 (Originally 05/08/1967)   TETANUS/TDAP  05/16/2030   COLONOSCOPY (Pts 45-62yr Insurance coverage will need to be confirmed)  12/14/2030   INFLUENZA VACCINE  Completed   Hepatitis C Screening  Completed   HIV Screening  Completed   Zoster Vaccines- Shingrix  Completed   HPV VACCINES  Aged Out    Health Maintenance  Health Maintenance Due  Topic Date Due   COVID-19 Vaccine (3 - Booster for Janssen series) 09/25/2020    Colorectal cancer screening: Type of  screening: Colonoscopy. Completed 12/13/2020. Repeat every 10 years  Lung Cancer Screening: (Low Dose CT Chest recommended if Age 60-80years, 30 pack-year currently smoking OR have quit w/in 15years.) does not qualify.   Additional Screening:  Hepatitis C Screening: does qualify; Completed 08/07/2013  Vision Screening: Recommended annual ophthalmology exams for early detection of glaucoma and other disorders of the eye. Is the patient up to date with their annual eye exam?  Yes  Who is the provider or what is the name of the office in which the patient attends annual eye exams? [facility] is not established with a provider, would they like to be referred to a provider to establish care? No .   Dental Screening: Recommended annual dental exams for proper oral hygiene  Community Resource Referral / Chronic Care Management: CRR required this visit?  No   CCM required this visit?  No      Plan:     I have personally reviewed and noted the following in the patient's chart:   Medical and social history Use of alcohol, tobacco or illicit drugs  Current medications and supplements including opioid prescriptions. Patient is currently taking opioid  prescriptions. Information provided to patient regarding non-opioid alternatives. Patient advised to discuss non-opioid treatment plan with their provider. Functional ability and status Nutritional status Physical activity Advanced directives List of other physicians Hospitalizations, surgeries, and ER visits in previous 12 months Vitals Screenings to include cognitive, depression, and falls Referrals and appointments  In addition, I have reviewed and discussed with patient certain preventive protocols, quality metrics, and best practice recommendations. A written personalized care plan for preventive services as well as general preventive health recommendations were provided to patient.     Sandrea Hammond, LPN   22/11/16    Nurse Notes: None

## 2021-07-19 ENCOUNTER — Other Ambulatory Visit: Payer: Self-pay | Admitting: Cardiology

## 2021-07-21 ENCOUNTER — Ambulatory Visit (INDEPENDENT_AMBULATORY_CARE_PROVIDER_SITE_OTHER): Payer: Medicare Other

## 2021-07-21 DIAGNOSIS — E538 Deficiency of other specified B group vitamins: Secondary | ICD-10-CM

## 2021-07-21 NOTE — Progress Notes (Signed)
Cyanocobalamin injection given to right deltoid.  Patient tolerated well. 

## 2021-08-06 HISTORY — PX: CERVICAL FUSION: SHX112

## 2021-08-08 ENCOUNTER — Other Ambulatory Visit: Payer: Self-pay | Admitting: Family Medicine

## 2021-08-08 ENCOUNTER — Other Ambulatory Visit: Payer: Self-pay | Admitting: Nurse Practitioner

## 2021-08-08 DIAGNOSIS — E039 Hypothyroidism, unspecified: Secondary | ICD-10-CM

## 2021-08-08 DIAGNOSIS — I10 Essential (primary) hypertension: Secondary | ICD-10-CM

## 2021-08-08 DIAGNOSIS — F411 Generalized anxiety disorder: Secondary | ICD-10-CM

## 2021-08-08 DIAGNOSIS — K219 Gastro-esophageal reflux disease without esophagitis: Secondary | ICD-10-CM

## 2021-08-08 DIAGNOSIS — R1012 Left upper quadrant pain: Secondary | ICD-10-CM

## 2021-08-08 DIAGNOSIS — R110 Nausea: Secondary | ICD-10-CM

## 2021-08-08 DIAGNOSIS — E782 Mixed hyperlipidemia: Secondary | ICD-10-CM

## 2021-08-16 ENCOUNTER — Ambulatory Visit: Payer: Medicare Other | Admitting: Family Medicine

## 2021-08-21 ENCOUNTER — Ambulatory Visit (INDEPENDENT_AMBULATORY_CARE_PROVIDER_SITE_OTHER): Payer: Medicare Other

## 2021-08-21 DIAGNOSIS — E538 Deficiency of other specified B group vitamins: Secondary | ICD-10-CM

## 2021-08-21 NOTE — Progress Notes (Signed)
Cyanocobalamin injection given to left deltoid.  Patient tolerated well. 

## 2021-08-22 ENCOUNTER — Ambulatory Visit (INDEPENDENT_AMBULATORY_CARE_PROVIDER_SITE_OTHER): Payer: Medicare Other | Admitting: Family Medicine

## 2021-08-22 VITALS — BP 109/60 | HR 58 | Temp 98.3°F | Ht 68.0 in | Wt 210.0 lb

## 2021-08-22 DIAGNOSIS — F411 Generalized anxiety disorder: Secondary | ICD-10-CM

## 2021-08-22 DIAGNOSIS — E782 Mixed hyperlipidemia: Secondary | ICD-10-CM

## 2021-08-22 DIAGNOSIS — I10 Essential (primary) hypertension: Secondary | ICD-10-CM

## 2021-08-22 DIAGNOSIS — E039 Hypothyroidism, unspecified: Secondary | ICD-10-CM | POA: Diagnosis not present

## 2021-08-22 DIAGNOSIS — E611 Iron deficiency: Secondary | ICD-10-CM

## 2021-08-22 MED ORDER — HYDROCHLOROTHIAZIDE 50 MG PO TABS: 50.0000 mg | ORAL_TABLET | Freq: Every day | ORAL | 3 refills | Status: DC

## 2021-08-22 MED ORDER — ATORVASTATIN CALCIUM 80 MG PO TABS: ORAL_TABLET | ORAL | 3 refills | Status: DC

## 2021-08-22 NOTE — Progress Notes (Signed)
Subjective: QQ:VZDGLOV disorder, hypothyroidism PCP: Janora Norlander, DO FIE:PPIRJ Jeffrey Hooper is a 61 y.o. male presenting to clinic today for:  1. Anxiety disorder He reports overall stability of anxiety disorder with Paxil 20 mg daily.  He continues to struggle with chronic back pain and there is plans for possible CT myelogram at some point if symptoms do not continue gradually improving.  He is currently treated with Norco by his specialist but uses this very sparingly.  2. Hypothyroidism Denies any changes in bowel habits, energy.  No tremor.  3.  Vitamin B12/iron deficiency Patient noted to have B12 and iron deficiency previously.  His last iron level was improving and may but he has not had this rechecked since that time.  He has continued monthly B12 injections with last injection yesterday.  ROS: Per HPI  Allergies  Allergen Reactions   Alpha-Gal Rash and Other (See Comments)    SEVERE ALLERGY TO ANY MEAT OR MEAT DERIVED PRODUCTS FROM 4 LEGGED ANIMALS > > BEEF, PORK , GOATS, DEER, ETC. < < RESULT OF BITE FROM LONE STAR TICK   Beef (Bovine) Protein Rash and Other (See Comments)   Beef-Derived Products Rash and Other (See Comments)   Lambs Quarters Rash and Other (See Comments)   Pork-Derived Products Rash and Other (See Comments)   Prednisone Other (See Comments)    im depo medrol caused dizziness - pt passed out. Ask pt before giving. Pt can take oral prednisone    Brilinta [Ticagrelor] Hives    Pt ate pork on the same day he took Brilinta before knowing he had alpha gal. May have been the alpha gal reaction, pt is unsure.    Doxycycline Hives   Gabapentin Swelling   Lyrica [Pregabalin]     Dry mouth and felt raw   Methylprednisolone Sodium Succ     im depo medrol caused dizziness - pt passed out. Ask pt before giving. Issue occurred when administered in the left arm    Penicillins Hives    Did it involve swelling of the face/tongue/throat, SOB, or low BP? No Did  it involve sudden or severe rash/hives, skin peeling, or any reaction on the inside of your mouth or nose? No Did you need to seek medical attention at a hospital or doctor's office? No When did it last happen?      10 + years ago If all above answers are NO, may proceed with cephalosporin use.    Shellfish Allergy Hives   Adhesive [Tape] Rash   Fentanyl Rash    Reaction to adhesive, not the drug    Past Medical History:  Diagnosis Date   Allergy    Allergy to alpha-gal    Anxiety    Arthritis    CAD (coronary artery disease), native coronary artery 12/03/2015   Coronary artery disease    Dysrhythmia    "irregular and PAC'S"   GERD (gastroesophageal reflux disease)    Head injury with loss of consciousness ([facility])    back in the 1980's   History of kidney stones    Hyperlipidemia    Hypertension    Hypothyroid    Multiple benign nevi    Neuromuscular disorder ([place]) 2011   LEFT RADIAL NERVE SURGERY R/T TRAUMA   Pneumonia 2014, 2017   Prostatism    Pulmonary nodule    Spinal cord stimulator status    told to bring remote to surgery    Current Outpatient Medications:    ondansetron (ZOFRAN-ODT) 4  MG disintegrating tablet, DISSOLVE 1 TABLET IN MOUTH EVERY 8 HOURS AS NEEDED FOR NAUSEA/ VOMITING, Disp: 60 tablet, Rfl: 0   promethazine (PHENERGAN) 25 MG tablet, TAKE 0.5-1 TABLETS (12.5-25 MG TOTAL) BY MOUTH 2 (TWO) TIMES DAILY AS NEEDED FOR NAUSEA OR VOMITING., Disp: 45 tablet, Rfl: 1   acetaminophen (TYLENOL) 500 MG tablet, Take 1,000 mg by mouth daily as needed for mild pain or headache. , Disp: , Rfl:    albuterol (VENTOLIN HFA) 108 (90 Base) MCG/ACT inhaler, Inhale 2 puffs into the lungs every 6 (six) hours as needed for wheezing or shortness of breath., Disp: 18 g, Rfl: 1   amLODipine (NORVASC) 10 MG tablet, TAKE 1 TABLET BY MOUTH EVERY DAY, Disp: 90 tablet, Rfl: 3   aspirin EC 81 MG tablet, Take 81 mg by mouth at bedtime., Disp: , Rfl:    atorvastatin (LIPITOR) 80 MG tablet,  TAKE 1 TABLET BY MOUTH EVERYDAY AT BEDTIME, Disp: 90 tablet, Rfl: 0   cetirizine (ZYRTEC) 10 MG tablet, Take 10 mg by mouth daily., Disp: , Rfl:    dextromethorphan-guaiFENesin (ROBITUSSIN-DM) 10-100 MG/5ML liquid, Take 5 mLs by mouth every 4 (four) hours as needed for cough. (Patient not taking: Reported on 07/11/2021), Disp: 180 mL, Rfl: 0   diphenhydrAMINE (BENADRYL) 25 MG tablet, Take 25 mg by mouth 2 (two) times daily as needed for itching. , Disp: , Rfl:    EPINEPHRINE 0.3 mg/0.3 mL IJ SOAJ injection, INJECT 0.3 MLS (0.3 MG TOTAL) INTO THE MUSCLE AS NEEDED FOR ANAPHYLAXIS., Disp: 2 each, Rfl: 3   fluticasone (FLONASE) 50 MCG/ACT nasal spray, Place 2 sprays into both nostrils daily., Disp: 48 g, Rfl: 1   hydrochlorothiazide (HYDRODIURIL) 50 MG tablet, TAKE 1 TABLET BY MOUTH EVERY DAY, Disp: 90 tablet, Rfl: 0   HYDROcodone-acetaminophen (NORCO/VICODIN) 5-325 MG tablet, Take 1 tablet by mouth every 6 (six) hours as needed for moderate pain. for pain, Disp: , Rfl:    ketoconazole (NIZORAL) 2 % shampoo, Apply 1 application topically 3 (three) times a week., Disp: , Rfl:    levothyroxine (SYNTHROID) 100 MCG tablet, TAKE 1 TABLET BY MOUTH EVERY DAY, Disp: 90 tablet, Rfl: 2   meloxicam (MOBIC) 7.5 MG tablet, Take 7.5 mg by mouth in the morning and at bedtime. (Patient not taking: Reported on 07/11/2021), Disp: , Rfl:    nitroGLYCERIN (NITROSTAT) 0.4 MG SL tablet, PLACE 1 TABLET (0.4 MG TOTAL) UNDER THE TONGUE EVERY 5 (FIVE) MINUTES AS NEEDED FOR CHEST PAIN., Disp: 25 tablet, Rfl: 2   OVER THE COUNTER MEDICATION, Calmosepatine ointment -use as directed, Disp: , Rfl:    pantoprazole (PROTONIX) 40 MG tablet, TAKE 1 TABLET BY MOUTH TWICE A DAY, Disp: 180 tablet, Rfl: 1   PARoxetine (PAXIL) 20 MG tablet, TAKE 1 TABLET BY MOUTH EVERY DAY, Disp: 90 tablet, Rfl: 0  Current Facility-Administered Medications:    cyanocobalamin ((VITAMIN B-12)) injection 1,000 mcg, 1,000 mcg, Intramuscular, Weekly, Keylen Eckenrode,  Orian Amberg M, DO, 1,000 mcg at 07/21/21 0840   cyanocobalamin ((VITAMIN B-12)) injection 1,000 mcg, 1,000 mcg, Intramuscular, Q30 days, Dheeraj Hail M, DO, 1,000 mcg at 08/21/21 1036 Social History   Socioeconomic History   Marital status: Married    Spouse name: Not on file   Number of children: 3   Years of education: Not on file   Highest education level: Not on file  Occupational History   Occupation: [employer] Use   Smoking status: Former    Packs/day: 1.00  Years: 20.00    Pack years: 20.00    Types: Cigarettes    Quit date: 11/28/2006    Years since quitting: 14.7   Smokeless tobacco: Never  Vaping Use   Vaping Use: Never used  Substance and Sexual Activity   Alcohol use: Not Currently    Alcohol/week: 0.0 standard drinks    Comment: rarely   Drug use: No   Sexual activity: Not Currently  Other Topics Concern   Not on file  Social History Narrative   Lives at home with wife, not working disabled from the city of [place]   They have an adopted son at home (age 72  2022)   Former smoker no alcohol tobacco or drug use now   Social Determinants of Radio broadcast assistant Strain: Low Risk    Difficulty of Paying Living Expenses: Not hard at all  Food Insecurity: No Food Insecurity   Worried About Charity fundraiser in the Last Year: Never true   Arboriculturist in the Last Year: Never true  Transportation Needs: No Transportation Needs   Lack of Transportation (Medical): No   Lack of Transportation (Non-Medical): No  Physical Activity: Insufficiently Active   Days of Exercise per Week: 7 days   Minutes of Exercise per Session: 20 min  Stress: No Stress Concern Present   Feeling of Stress : Not at all  Social Connections: Socially Integrated   Frequency of Communication with Friends and Family: More than three times a week   Frequency of Social Gatherings with Friends and Family: More than three times a week   Attends Religious  Services: More than 4 times per year   Active Member of [employer] or Organizations: Yes   Attends Music therapist: More than 4 times per year   Marital Status: Married  Human resources officer Violence: Not At Risk   Fear of Current or Ex-Partner: No   Emotionally Abused: No   Physically Abused: No   Sexually Abused: No   Family History  Problem Relation Age of Onset   Heart failure Mother 31       Died age 84   Congestive Heart Failure Mother    Heart attack Father 62       Died with MI   Heart attack Paternal Grandmother    Heart attack Paternal Grandfather    Colon cancer Neg Hx     Objective: Office vital signs reviewed. BP 109/60    Pulse (!) 58    Temp 98.3 F (36.8 C) (Temporal)    Ht 5\' 8"  (1.727 m)    Wt 210 lb (95.3 kg)    SpO2 97%    BMI 31.93 kg/m   Physical Examination:  General: Awake, alert, well nourished male.  No acute distress HEENT: Sclera white.  Moist mucous membranes Cardio: Slightly bradycardic with regular rhythm, S1S2 heard, no murmurs appreciated Pulm: clear to auscultation bilaterally, no wheezes, rhonchi or rales; normal work of breathing on room air MSK: Antalgic gait.  Utilizing cane for ambulation  Assessment/ Plan: 61 y.o. male   GAD (generalized anxiety disorder)  Acquired hypothyroidism - Plan: TSH, T4, Free  Mixed hyperlipidemia - Plan: atorvastatin (LIPITOR) 80 MG tablet  Essential hypertension - Plan: hydrochlorothiazide (HYDRODIURIL) 50 MG tablet  Iron deficiency - Plan: Ferritin, CBC  Anxiety disorder stable.  Continue Paxil.  Check thyroid levels.  Patient appears to be euthymic  Lipitor renewed.  Not yet due for fasting lipid  Blood  pressure well controlled.  Continue current regimen.  HCTZ renewed  Have not checked iron or CBC since noted to be iron deficient back in May.  He is currently being treated with B12 monthly.  Since this was given yesterday we will plan to repeat B12 at next visit and may and I advised  him to hold off on his may injection until we collect that lab  No orders of the defined types were placed in this encounter.  No orders of the defined types were placed in this encounter.    Janora Norlander, DO [place] [phone]

## 2021-08-23 LAB — TSH: TSH: 2.61 u[IU]/mL (ref 0.450–4.500)

## 2021-08-23 LAB — CBC
Hematocrit: 39.8 % (ref 37.5–51.0)
Hemoglobin: 13.6 g/dL (ref 13.0–17.7)
MCH: 28.8 pg (ref 26.6–33.0)
MCHC: 34.2 g/dL (ref 31.5–35.7)
MCV: 84 fL (ref 79–97)
Platelets: 253 10*3/uL (ref 150–450)
RBC: 4.73 x10E6/uL (ref 4.14–5.80)
RDW: 14.6 % (ref 11.6–15.4)
WBC: 7.8 10*3/uL (ref 3.4–10.8)

## 2021-08-23 LAB — FERRITIN: Ferritin: 16 ng/mL — ABNORMAL LOW (ref 30–400)

## 2021-08-23 LAB — T4, FREE: Free T4: 1.66 ng/dL (ref 0.82–1.77)

## 2021-08-30 ENCOUNTER — Ambulatory Visit (INDEPENDENT_AMBULATORY_CARE_PROVIDER_SITE_OTHER): Payer: Medicare Other | Admitting: Family Medicine

## 2021-08-30 ENCOUNTER — Encounter: Payer: Self-pay | Admitting: Family Medicine

## 2021-08-30 VITALS — BP 134/69 | HR 84 | Temp 100.2°F | Ht 68.0 in | Wt 208.0 lb

## 2021-08-30 DIAGNOSIS — R52 Pain, unspecified: Secondary | ICD-10-CM

## 2021-08-30 DIAGNOSIS — J069 Acute upper respiratory infection, unspecified: Secondary | ICD-10-CM

## 2021-08-30 LAB — VERITOR FLU A/B WAIVED
Influenza A: NEGATIVE
Influenza B: NEGATIVE

## 2021-08-30 NOTE — Patient Instructions (Addendum)
Rotate Tylenol and Ibuprofen. Tylenol Cold & Sinus day and night OR Dayquil and Nyquil. Humidifier Nasal saline rinses

## 2021-08-30 NOTE — Progress Notes (Signed)
Assessment & Plan:  1. Viral URI Symptom management.  Education provided on upper respiratory infections.  Encouraged: Rotate Tylenol and Ibuprofen. Tylenol Cold & Sinus day and night OR Dayquil and Nyquil. Humidifier Nasal saline rinses  2. Body aches - Veritor Flu A/B Waived: negative   Follow up plan: Return if symptoms worsen or fail to improve.  Lucile Crater, NP Student  I personally was present during the history, physical exam, and medical decision-making activities of this service and have verified that the service and findings are accurately documented in the nurse practitioner student's note.  Hendricks Limes, MSN, APRN, FNP-C [facility]  Subjective:   Patient ID: Jeffrey Hooper, male    DOB: 10-30-1960, 61 y.o.   MRN: [id number]  HPI: Jeffrey Hooper is a 61 y.o. male presenting on 08/30/2021 for Generalized Body Aches, Fever, Cough, and Nasal Congestion (X 1 day)  Patient states that last night he started having a tickle in his throat that has progressed to generalized body aches, fever, cough, headache, and nasal congestion. He states he has only taken tylenol and robitussin to help manage symptoms.    ROS: Negative unless specifically indicated above in HPI.   Relevant past medical history reviewed and updated as indicated.   Allergies and medications reviewed and updated.   Current Outpatient Medications:    acetaminophen (TYLENOL) 500 MG tablet, Take 1,000 mg by mouth daily as needed for mild pain or headache. , Disp: , Rfl:    albuterol (VENTOLIN HFA) 108 (90 Base) MCG/ACT inhaler, Inhale 2 puffs into the lungs every 6 (six) hours as needed for wheezing or shortness of breath., Disp: 18 g, Rfl: 1   amLODipine (NORVASC) 10 MG tablet, TAKE 1 TABLET BY MOUTH EVERY DAY, Disp: 90 tablet, Rfl: 3   aspirin EC 81 MG tablet, Take 81 mg by mouth at bedtime., Disp: , Rfl:    atorvastatin (LIPITOR) 80 MG tablet, TAKE 1 TABLET BY MOUTH EVERYDAY AT  BEDTIME, Disp: 90 tablet, Rfl: 3   cetirizine (ZYRTEC) 10 MG tablet, Take 10 mg by mouth daily., Disp: , Rfl:    EPINEPHRINE 0.3 mg/0.3 mL IJ SOAJ injection, INJECT 0.3 MLS (0.3 MG TOTAL) INTO THE MUSCLE AS NEEDED FOR ANAPHYLAXIS., Disp: 2 each, Rfl: 3   fluticasone (FLONASE) 50 MCG/ACT nasal spray, Place 2 sprays into both nostrils daily., Disp: 48 g, Rfl: 1   hydrochlorothiazide (HYDRODIURIL) 50 MG tablet, Take 1 tablet (50 mg total) by mouth daily., Disp: 90 tablet, Rfl: 3   HYDROcodone-acetaminophen (NORCO/VICODIN) 5-325 MG tablet, Take 1 tablet by mouth every 6 (six) hours as needed for moderate pain. for pain, Disp: , Rfl:    ketoconazole (NIZORAL) 2 % shampoo, Apply 1 application topically 3 (three) times a week., Disp: , Rfl:    levothyroxine (SYNTHROID) 100 MCG tablet, TAKE 1 TABLET BY MOUTH EVERY DAY, Disp: 90 tablet, Rfl: 2   meloxicam (MOBIC) 7.5 MG tablet, Take 7.5 mg by mouth in the morning and at bedtime., Disp: , Rfl:    nitroGLYCERIN (NITROSTAT) 0.4 MG SL tablet, PLACE 1 TABLET (0.4 MG TOTAL) UNDER THE TONGUE EVERY 5 (FIVE) MINUTES AS NEEDED FOR CHEST PAIN., Disp: 25 tablet, Rfl: 2   ondansetron (ZOFRAN-ODT) 4 MG disintegrating tablet, DISSOLVE 1 TABLET IN MOUTH EVERY 8 HOURS AS NEEDED FOR NAUSEA/ VOMITING, Disp: 60 tablet, Rfl: 0   OVER THE COUNTER MEDICATION, Calmosepatine ointment -use as directed, Disp: , Rfl:    pantoprazole (PROTONIX) 40 MG tablet,  TAKE 1 TABLET BY MOUTH TWICE A DAY, Disp: 180 tablet, Rfl: 1   PARoxetine (PAXIL) 20 MG tablet, TAKE 1 TABLET BY MOUTH EVERY DAY, Disp: 90 tablet, Rfl: 0   promethazine (PHENERGAN) 25 MG tablet, TAKE 0.5-1 TABLETS (12.5-25 MG TOTAL) BY MOUTH 2 (TWO) TIMES DAILY AS NEEDED FOR NAUSEA OR VOMITING., Disp: 45 tablet, Rfl: 1  Current Facility-Administered Medications:    cyanocobalamin ((VITAMIN B-12)) injection 1,000 mcg, 1,000 mcg, Intramuscular, Q30 days, Gottschalk, Ashly M, DO, 1,000 mcg at 08/21/21 1036  Allergies  Allergen  Reactions   Alpha-Gal Rash and Other (See Comments)    SEVERE ALLERGY TO ANY MEAT OR MEAT DERIVED PRODUCTS FROM 4 LEGGED ANIMALS > > BEEF, PORK , GOATS, DEER, ETC. < < RESULT OF BITE FROM LONE STAR TICK   Beef (Bovine) Protein Rash and Other (See Comments)   Beef-Derived Products Rash and Other (See Comments)   Lambs Quarters Rash and Other (See Comments)   Pork-Derived Products Rash and Other (See Comments)   Prednisone Other (See Comments)    im depo medrol caused dizziness - pt passed out. Ask pt before giving. Pt can take oral prednisone    Brilinta [Ticagrelor] Hives    Pt ate pork on the same day he took Brilinta before knowing he had alpha gal. May have been the alpha gal reaction, pt is unsure.    Doxycycline Hives   Gabapentin Swelling   Lyrica [Pregabalin]     Dry mouth and felt raw   Methylprednisolone Sodium Succ     im depo medrol caused dizziness - pt passed out. Ask pt before giving. Issue occurred when administered in the left arm    Penicillins Hives    Did it involve swelling of the face/tongue/throat, SOB, or low BP? No Did it involve sudden or severe rash/hives, skin peeling, or any reaction on the inside of your mouth or nose? No Did you need to seek medical attention at a hospital or doctor's office? No When did it last happen?      10 + years ago If all above answers are NO, may proceed with cephalosporin use.    Shellfish Allergy Hives   Adhesive [Tape] Rash   Fentanyl Rash    Reaction to adhesive, not the drug     Objective:   BP 134/69    Pulse 84    Temp 100.2 F (37.9 C) (Temporal)    Ht 5\' 8"  (1.727 m)    Wt 94.3 kg    SpO2 97%    BMI 31.63 kg/m    Physical Exam Vitals reviewed.  Constitutional:      General: He is not in acute distress.    Appearance: Normal appearance. He is not ill-appearing, toxic-appearing or diaphoretic.  HENT:     Head: Normocephalic and atraumatic.     Nose: Congestion and rhinorrhea present.     Mouth/Throat:      Pharynx: Posterior oropharyngeal erythema present.  Eyes:     General: No scleral icterus.       Right eye: No discharge.        Left eye: No discharge.     Conjunctiva/sclera: Conjunctivae normal.  Cardiovascular:     Rate and Rhythm: Normal rate and regular rhythm.     Heart sounds: Normal heart sounds. No murmur heard.   No friction rub. No gallop.  Pulmonary:     Effort: Pulmonary effort is normal. No respiratory distress.     Breath sounds: Normal breath  sounds. No stridor. No wheezing, rhonchi or rales.  Musculoskeletal:        General: Normal range of motion.     Cervical back: Normal range of motion.     Right lower leg: No edema.     Left lower leg: No edema.  Skin:    General: Skin is warm and dry.  Neurological:     Mental Status: He is alert and oriented to person, place, and time. Mental status is at baseline.  Psychiatric:        Mood and Affect: Mood normal.        Behavior: Behavior normal.        Thought Content: Thought content normal.        Judgment: Judgment normal.

## 2021-09-01 ENCOUNTER — Ambulatory Visit (INDEPENDENT_AMBULATORY_CARE_PROVIDER_SITE_OTHER): Payer: Medicare Other | Admitting: Nurse Practitioner

## 2021-09-01 ENCOUNTER — Encounter: Payer: Self-pay | Admitting: Nurse Practitioner

## 2021-09-01 DIAGNOSIS — K921 Melena: Secondary | ICD-10-CM

## 2021-09-01 NOTE — Progress Notes (Signed)
° °  Virtual Visit  Note Due to COVID-19 pandemic this visit was conducted virtually. This visit type was conducted due to national recommendations for restrictions regarding the COVID-19 Pandemic (e.g. social distancing, sheltering in place) in an effort to limit this patient's exposure and mitigate transmission in our community. All issues noted in this document were discussed and addressed.  A physical exam was not performed with this format.  I connected with Jeffrey Hooper on 09/01/21 at 5:20 pm  by telephone and verified that I am speaking with the correct person using two identifiers. Jeffrey Hooper is currently located at home during visit. The provider, Ivy Lynn, NP is located in their office at time of visit.  I discussed the limitations, risks, security and privacy concerns of performing an evaluation and management service by telephone and the availability of in person appointments. I also discussed with the patient that there may be a patient responsible charge related to this service. The patient expressed understanding and agreed to proceed.   History and Present Illness:  HPI Patient complain of dark/black stool in the past few days.  No other signs and symptoms relating to this complaint.  Patient has no fever, nausea, vomiting, fatigue, headache, dizziness or change in vision.  No new signs and symptoms of worsening anemia.   Review of Systems  Constitutional: Negative.  Negative for chills, diaphoresis, fever, malaise/fatigue and weight loss.  HENT: Negative.    Respiratory: Negative.    Cardiovascular: Negative.   Gastrointestinal:  Positive for blood in stool. Negative for constipation, nausea and vomiting.  Genitourinary: Negative.   Musculoskeletal: Negative.   Skin: Negative.  Negative for rash.  Psychiatric/Behavioral: Negative.    All other systems reviewed and are negative.   Observations/Objective: Tele-visit patient not in distress  Assessment and  Plan: Patient is reporting dark stools in the past few days with no other symptoms of pain, abdominal cramping, fatigue, dizziness, nausea or vomiting.  Patient has a history of anemia and is currently on iron supplement and B12.  I provided education to patient that it is very common to have black stool with iron supplement.  Patient verbalized understanding and knows to go to the emergency department with bright red blood in stool.  Follow Up Instructions:  Follow up with worsening    I discussed the assessment and treatment plan with the patient. The patient was provided an opportunity to ask questions and all were answered. The patient agreed with the plan and demonstrated an understanding of the instructions.   The patient was advised to call back or seek an in-person evaluation if the symptoms worsen or if the condition fails to improve as anticipated.  The above assessment and management plan was discussed with the patient. The patient verbalized understanding of and has agreed to the management plan. Patient is aware to call the clinic if symptoms persist or worsen. Patient is aware when to return to the clinic for a follow-up visit. Patient educated on when it is appropriate to go to the emergency department.   Time call ended: 5:28 PM  I provided 8 minutes of  non face-to-face time during this encounter.    Ivy Lynn, NP

## 2021-09-03 ENCOUNTER — Encounter: Payer: Self-pay | Admitting: Family Medicine

## 2021-09-21 ENCOUNTER — Ambulatory Visit (INDEPENDENT_AMBULATORY_CARE_PROVIDER_SITE_OTHER): Payer: Medicare Other

## 2021-09-21 DIAGNOSIS — E538 Deficiency of other specified B group vitamins: Secondary | ICD-10-CM

## 2021-09-21 NOTE — Progress Notes (Signed)
Cyanocobalamin injection given to right deltoid.  Patient tolerated well. 

## 2021-10-19 ENCOUNTER — Ambulatory Visit (INDEPENDENT_AMBULATORY_CARE_PROVIDER_SITE_OTHER): Payer: Medicare Other

## 2021-10-19 DIAGNOSIS — E538 Deficiency of other specified B group vitamins: Secondary | ICD-10-CM | POA: Diagnosis not present

## 2021-10-19 NOTE — Progress Notes (Signed)
Cyanocobalamin injection given to left deltoid.  Patient tolerated well. 

## 2021-11-14 ENCOUNTER — Other Ambulatory Visit: Payer: Self-pay | Admitting: Family Medicine

## 2021-11-14 DIAGNOSIS — R0602 Shortness of breath: Secondary | ICD-10-CM

## 2021-11-14 DIAGNOSIS — F411 Generalized anxiety disorder: Secondary | ICD-10-CM

## 2021-11-14 DIAGNOSIS — R1012 Left upper quadrant pain: Secondary | ICD-10-CM

## 2021-11-14 DIAGNOSIS — R110 Nausea: Secondary | ICD-10-CM

## 2021-11-15 NOTE — Telephone Encounter (Signed)
Please make sure he is following up with his GI doctor.  Worried about ongoing need for so many nausea medications.   ?

## 2021-11-15 NOTE — Telephone Encounter (Signed)
Attempted to contact patient, NA 11/15/21 ?

## 2021-11-20 ENCOUNTER — Ambulatory Visit (INDEPENDENT_AMBULATORY_CARE_PROVIDER_SITE_OTHER): Payer: Medicare Other | Admitting: Family Medicine

## 2021-11-20 DIAGNOSIS — E538 Deficiency of other specified B group vitamins: Secondary | ICD-10-CM | POA: Diagnosis not present

## 2021-11-20 NOTE — Progress Notes (Signed)
Pt given b12 in right arm and tolerated well. ?

## 2021-12-20 ENCOUNTER — Encounter: Payer: Self-pay | Admitting: Family Medicine

## 2021-12-20 ENCOUNTER — Ambulatory Visit (INDEPENDENT_AMBULATORY_CARE_PROVIDER_SITE_OTHER): Payer: Medicare Other

## 2021-12-20 ENCOUNTER — Ambulatory Visit (INDEPENDENT_AMBULATORY_CARE_PROVIDER_SITE_OTHER): Payer: Medicare Other | Admitting: Family Medicine

## 2021-12-20 VITALS — BP 137/78 | HR 67 | Temp 97.4°F | Ht 68.0 in | Wt 209.2 lb

## 2021-12-20 DIAGNOSIS — G8929 Other chronic pain: Secondary | ICD-10-CM

## 2021-12-20 DIAGNOSIS — N411 Chronic prostatitis: Secondary | ICD-10-CM

## 2021-12-20 DIAGNOSIS — I25119 Atherosclerotic heart disease of native coronary artery with unspecified angina pectoris: Secondary | ICD-10-CM

## 2021-12-20 DIAGNOSIS — M542 Cervicalgia: Secondary | ICD-10-CM

## 2021-12-20 DIAGNOSIS — E538 Deficiency of other specified B group vitamins: Secondary | ICD-10-CM

## 2021-12-20 DIAGNOSIS — R6881 Early satiety: Secondary | ICD-10-CM

## 2021-12-20 DIAGNOSIS — E611 Iron deficiency: Secondary | ICD-10-CM

## 2021-12-20 DIAGNOSIS — Z9861 Coronary angioplasty status: Secondary | ICD-10-CM

## 2021-12-20 DIAGNOSIS — T7800XD Anaphylactic reaction due to unspecified food, subsequent encounter: Secondary | ICD-10-CM

## 2021-12-20 DIAGNOSIS — I2510 Atherosclerotic heart disease of native coronary artery without angina pectoris: Secondary | ICD-10-CM | POA: Diagnosis not present

## 2021-12-20 DIAGNOSIS — E039 Hypothyroidism, unspecified: Secondary | ICD-10-CM | POA: Diagnosis not present

## 2021-12-20 DIAGNOSIS — Z125 Encounter for screening for malignant neoplasm of prostate: Secondary | ICD-10-CM

## 2021-12-20 MED ORDER — NITROGLYCERIN 0.4 MG SL SUBL: 0.4000 mg | SUBLINGUAL_TABLET | SUBLINGUAL | 2 refills | Status: DC | PRN

## 2021-12-20 MED ORDER — EPINEPHRINE 0.3 MG/0.3ML IJ SOAJ: 0.3000 mg | INTRAMUSCULAR | 3 refills | Status: DC | PRN

## 2021-12-20 NOTE — Progress Notes (Signed)
? ?Subjective: ?SH:FWYOV up ?PCP: Janora Norlander, DO ?Jeffrey Hooper is a 61 y.o. male presenting to clinic today for: ? ?1. Neck pain ?Patient reports some increasing neck pain radiating to his left shoulder.  He has chronic low back problems as well and will be seeing his specialist, Dr. Ellene Route, in about 6 weeks.   ? ?2.  B12 deficiency ?Patient needs B12 shot today but also needs to get the labs prior to the injection.  Reports some intermittent neuropathic changes in the feet but not sure if this is from B12 or from his back ? ?3.  Nausea ?Patient reports early satiety with associated nausea.  He uses Phenergan or Zofran if needed for nausea and vomiting.  He is overdue to see Dr. Ardis Hughs and has not yet reached out to him for an appointment. ? ? ?ROS: Per HPI ? ?Allergies  ?Allergen Reactions  ? Alpha-Gal Rash and Other (See Comments)  ?  SEVERE ALLERGY TO ANY MEAT OR MEAT DERIVED PRODUCTS FROM 4 LEGGED ANIMALS ?> > BEEF, PORK , GOATS, DEER, ETC. < < ?RESULT OF BITE FROM LONE STAR TICK  ? Beef (Bovine) Protein Rash and Other (See Comments)  ? Beef-Derived Products Rash and Other (See Comments)  ? Lambs Quarters Rash and Other (See Comments)  ? Pork-Derived Products Rash and Other (See Comments)  ? Prednisone Other (See Comments)  ?  im depo medrol caused dizziness - pt passed out. Ask pt before giving. Pt can take oral prednisone   ? Brilinta [Ticagrelor] Hives  ?  Pt ate pork on the same day he took Brilinta before knowing he had alpha gal. May have been the alpha gal reaction, pt is unsure.   ? Doxycycline Hives  ? Gabapentin Swelling  ? Lyrica [Pregabalin]   ?  Dry mouth and felt raw  ? Methylprednisolone Sodium Succ   ?  im depo medrol caused dizziness - pt passed out. Ask pt before giving. Issue occurred when administered in the left arm   ? Penicillins Hives  ?  Did it involve swelling of the face/tongue/throat, SOB, or low BP? No ?Did it involve sudden or severe rash/hives, skin peeling, or any  reaction on the inside of your mouth or nose? No ?Did you need to seek medical attention at a hospital or doctor's office? No ?When did it last happen?      10 + years ago ?If all above answers are ?NO?, may proceed with cephalosporin use. ?  ? Shellfish Allergy Hives  ? Adhesive [Tape] Rash  ? Fentanyl Rash  ?  Reaction to adhesive, not the drug   ? ?Past Medical History:  ?Diagnosis Date  ? Allergy   ? Allergy to alpha-gal   ? Anxiety   ? Arthritis   ? CAD (coronary artery disease), native coronary artery 12/03/2015  ? Coronary artery disease   ? Dysrhythmia   ? "irregular and PAC'S"  ? GERD (gastroesophageal reflux disease)   ? Head injury with loss of consciousness ([place])   ? back in the 1980's  ? History of kidney stones   ? Hyperlipidemia   ? Hypertension   ? Hypothyroid   ? Multiple benign nevi   ? Neuromuscular disorder ([place]) 2011  ? LEFT RADIAL NERVE SURGERY R/T TRAUMA  ? Pneumonia 2014, 2017  ? Prostatism   ? Pulmonary nodule   ? Spinal cord stimulator status   ? told to bring remote to surgery  ? ? ?Current Outpatient Medications:  ?  acetaminophen (TYLENOL) 500 MG tablet, Take 1,000 mg by mouth daily as needed for mild pain or headache. , Disp: , Rfl:  ?  amLODipine (NORVASC) 10 MG tablet, TAKE 1 TABLET BY MOUTH EVERY DAY, Disp: 90 tablet, Rfl: 3 ?  aspirin EC 81 MG tablet, Take 81 mg by mouth at bedtime., Disp: , Rfl:  ?  atorvastatin (LIPITOR) 80 MG tablet, TAKE 1 TABLET BY MOUTH EVERYDAY AT BEDTIME, Disp: 90 tablet, Rfl: 3 ?  cetirizine (ZYRTEC) 10 MG tablet, Take 10 mg by mouth daily., Disp: , Rfl:  ?  fluticasone (FLONASE) 50 MCG/ACT nasal spray, Place 2 sprays into both nostrils daily., Disp: 48 g, Rfl: 1 ?  hydrochlorothiazide (HYDRODIURIL) 50 MG tablet, Take 1 tablet (50 mg total) by mouth daily., Disp: 90 tablet, Rfl: 3 ?  HYDROcodone-acetaminophen (NORCO/VICODIN) 5-325 MG tablet, Take 1 tablet by mouth every 6 (six) hours as needed for moderate pain. for pain, Disp: , Rfl:  ?  ketoconazole  (NIZORAL) 2 % shampoo, Apply 1 application topically 3 (three) times a week., Disp: , Rfl:  ?  levothyroxine (SYNTHROID) 100 MCG tablet, TAKE 1 TABLET BY MOUTH EVERY DAY, Disp: 90 tablet, Rfl: 2 ?  meloxicam (MOBIC) 7.5 MG tablet, Take 7.5 mg by mouth in the morning and at bedtime., Disp: , Rfl:  ?  ondansetron (ZOFRAN-ODT) 4 MG disintegrating tablet, DISSOLVE 1 TABLET IN MOUTH EVERY 8 HOURS AS NEEDED FOR NAUSEA/ VOMITING, Disp: 60 tablet, Rfl: 0 ?  OVER THE COUNTER MEDICATION, Calmosepatine ointment -use as directed, Disp: , Rfl:  ?  pantoprazole (PROTONIX) 40 MG tablet, TAKE 1 TABLET BY MOUTH TWICE A DAY, Disp: 180 tablet, Rfl: 1 ?  PARoxetine (PAXIL) 20 MG tablet, TAKE 1 TABLET BY MOUTH EVERY DAY, Disp: 90 tablet, Rfl: 0 ?  promethazine (PHENERGAN) 25 MG tablet, TAKE 0.5-1 TABLETS (12.5-25 MG TOTAL) BY MOUTH 2 (TWO) TIMES DAILY AS NEEDED FOR NAUSEA OR VOMITING., Disp: 45 tablet, Rfl: 1 ?  VENTOLIN HFA 108 (90 Base) MCG/ACT inhaler, TAKE 2 PUFFS BY MOUTH EVERY 6 HOURS AS NEEDED FOR WHEEZE OR SHORTNESS OF BREATH, Disp: 18 each, Rfl: 1 ?  EPINEPHrine 0.3 mg/0.3 mL IJ SOAJ injection, Inject 0.3 mg into the muscle as needed for anaphylaxis., Disp: 2 each, Rfl: 3 ?  nitroGLYCERIN (NITROSTAT) 0.4 MG SL tablet, Place 1 tablet (0.4 mg total) under the tongue every 5 (five) minutes as needed for chest pain., Disp: 25 tablet, Rfl: 2 ? ?Current Facility-Administered Medications:  ?  cyanocobalamin ((VITAMIN B-12)) injection 1,000 mcg, 1,000 mcg, Intramuscular, Q30 days, Ronnie Doss M, DO, 1,000 mcg at 12/20/21 4332 ?Social History  ? ?Socioeconomic History  ? Marital status: Married  ?  Spouse name: Not on file  ? Number of children: 3  ? Years of education: Not on file  ? Highest education level: Not on file  ?Occupational History  ? Occupation: [employer]  ?Tobacco Use  ? Smoking status: Former  ?  Packs/day: 1.00  ?  Years: 20.00  ?  Pack years: 20.00  ?  Types: Cigarettes  ?  Quit date: 11/28/2006   ?  Years since quitting: 15.0  ? Smokeless tobacco: Never  ?Vaping Use  ? Vaping Use: Never used  ?Substance and Sexual Activity  ? Alcohol use: Not Currently  ?  Alcohol/week: 0.0 standard drinks  ?  Comment: rarely  ? Drug use: No  ? Sexual activity: Not Currently  ?Other Topics Concern  ? Not  on file  ?Social History Narrative  ? Lives at home with wife, not working disabled from the city of [place]  ? They have an adopted son at home (age 40  2022)  ? Former smoker no alcohol tobacco or drug use now  ? ?Social Determinants of Health  ? ?Financial Resource Strain: Low Risk   ? Difficulty of Paying Living Expenses: Not hard at all  ?Food Insecurity: No Food Insecurity  ? Worried About Charity fundraiser in the Last Year: Never true  ? Ran Out of Food in the Last Year: Never true  ?Transportation Needs: No Transportation Needs  ? Lack of Transportation (Medical): No  ? Lack of Transportation (Non-Medical): No  ?Physical Activity: Insufficiently Active  ? Days of Exercise per Week: 7 days  ? Minutes of Exercise per Session: 20 min  ?Stress: No Stress Concern Present  ? Feeling of Stress : Not at all  ?Social Connections: Socially Integrated  ? Frequency of Communication with Friends and Family: More than three times a week  ? Frequency of Social Gatherings with Friends and Family: More than three times a week  ? Attends Religious Services: More than 4 times per year  ? Active Member of Clubs or Organizations: Yes  ? Attends Archivist Meetings: More than 4 times per year  ? Marital Status: Married  ?Intimate Partner Violence: Not At Risk  ? Fear of Current or Ex-Partner: No  ? Emotionally Abused: No  ? Physically Abused: No  ? Sexually Abused: No  ? ?Family History  ?Problem Relation Age of Onset  ? Heart failure Mother 59  ?     Died age 3  ? Congestive Heart Failure Mother   ? Heart attack Father 58  ?     Died with MI  ? Heart attack Paternal Grandmother   ? Heart attack Paternal Grandfather   ?  Colon cancer Neg Hx   ? ? ?Objective: ?Office vital signs reviewed. ?BP 137/78   Pulse 67   Temp (!) 97.4 ?F (36.3 ?C)   Ht '5\' 8"'$  (1.727 m)   Wt 209 lb 3.2 oz (94.9 kg)   SpO2 98%   BMI 31.81 kg/m?  ? ?[employer]

## 2021-12-21 LAB — T4, FREE: Free T4: 1.63 ng/dL (ref 0.82–1.77)

## 2021-12-21 LAB — FERRITIN: Ferritin: 72 ng/mL (ref 30–400)

## 2021-12-21 LAB — TSH: TSH: 10.9 u[IU]/mL — ABNORMAL HIGH (ref 0.450–4.500)

## 2021-12-21 LAB — IRON: Iron: 55 ug/dL (ref 38–169)

## 2021-12-21 LAB — VITAMIN B12: Vitamin B-12: 597 pg/mL (ref 232–1245)

## 2021-12-22 LAB — LIPID PANEL
Chol/HDL Ratio: 3.2 ratio (ref 0.0–5.0)
Cholesterol, Total: 130 mg/dL (ref 100–199)
HDL: 41 mg/dL (ref >39)
LDL Chol Calc (NIH): 58 mg/dL (ref 0–99)
Triglycerides: 184 mg/dL — ABNORMAL HIGH (ref 0–149)
VLDL Cholesterol Cal: 31 mg/dL (ref 5–40)

## 2021-12-22 LAB — PSA: Prostate Specific Ag, Serum: 0.8 ng/mL (ref 0.0–4.0)

## 2021-12-22 LAB — SPECIMEN STATUS REPORT

## 2022-01-22 ENCOUNTER — Ambulatory Visit (INDEPENDENT_AMBULATORY_CARE_PROVIDER_SITE_OTHER): Payer: Medicare Other | Admitting: Emergency Medicine

## 2022-01-22 DIAGNOSIS — E538 Deficiency of other specified B group vitamins: Secondary | ICD-10-CM | POA: Diagnosis not present

## 2022-01-22 NOTE — Progress Notes (Signed)
Patient presents today for B12 Injection. Injection given in Right Deltoid. Patient tolerated well.

## 2022-02-02 ENCOUNTER — Other Ambulatory Visit: Payer: Medicare Other

## 2022-02-02 ENCOUNTER — Other Ambulatory Visit: Payer: Self-pay | Admitting: Family Medicine

## 2022-02-02 DIAGNOSIS — E039 Hypothyroidism, unspecified: Secondary | ICD-10-CM

## 2022-02-03 LAB — TSH: TSH: 7.42 u[IU]/mL — ABNORMAL HIGH (ref 0.450–4.500)

## 2022-02-03 LAB — T4, FREE: Free T4: 1.39 ng/dL (ref 0.82–1.77)

## 2022-02-10 ENCOUNTER — Other Ambulatory Visit: Payer: Self-pay | Admitting: Family Medicine

## 2022-02-10 DIAGNOSIS — F411 Generalized anxiety disorder: Secondary | ICD-10-CM

## 2022-02-12 NOTE — Progress Notes (Unsigned)
Cardiology Office Note   Date:  02/14/2022   ID:  Jeffrey Hooper, DOB 1961-02-20, MRN [id number]  PCP:  Janora Norlander, DO  Cardiologist:   Minus Breeding, MD   Chief Complaint  Patient presents with   Coronary Artery Disease       History of Present Illness: Jeffrey Hooper is a 61 y.o. male who presents follow up of CAD.  Stress perfusion study in 2017 suggested a moderate sized defect in the inferior wall with ischemia. The EF was well-preserved.  The patient presented to [facility] on 12/02/15 for cath and was found to have single vessel obstructive CAD involving the mid LAD. He underwent successful PCI with a mid LAD DES.  He had chest pain in 2021 and had a negative perfusion study.    We saw him in Dec 2021 prior to diagnostic lap hiatal hernia repair.   Since I last saw him he has done okay from a cardiovascular standpoint. The patient denies any new symptoms such as chest discomfort, neck or arm discomfort. There has been no new shortness of breath, PND or orthopnea. There have been no reported palpitations, presyncope or syncope.  He is able to do physical activity such as climbing a flight of stairs he works at a [employer].  Of note he is having cervical neck pain and might have to have surgery.   Past Medical History:  Diagnosis Date   Allergy    Allergy to alpha-gal    Anxiety    Arthritis    CAD (coronary artery disease), native coronary artery 12/03/2015   Coronary artery disease    Dysrhythmia    "irregular and PAC'S"   GERD (gastroesophageal reflux disease)    Head injury with loss of consciousness ([place])    back in the 1980's   History of kidney stones    Hyperlipidemia    Hypertension    Hypothyroid    Multiple benign nevi    Neuromuscular disorder ([place]) 2011   LEFT RADIAL NERVE SURGERY R/T TRAUMA   Pneumonia 2014, 2017   Prostatism    Pulmonary nodule    Spinal cord stimulator status    told to bring remote to surgery    Past Surgical History:   Procedure Laterality Date   BACK SURGERY     T12 - L1, 11 times including neck   CARDIAC CATHETERIZATION N/A 12/02/2015   Procedure: Left Heart Cath and Coronary Angiography;  Surgeon: Peter M Martinique, MD;  Location: [facility];  Service: Cardiovascular;  Laterality: N/A;   CARDIAC CATHETERIZATION N/A 12/02/2015   Procedure: Coronary Stent Intervention;  Surgeon: Peter M Martinique, MD;  Location: [facility];  Service: Cardiovascular;  Laterality: N/A;  mid LAD Promus 2.5x12   COLONOSCOPY     CORONARY ANGIOPLASTY     one stent placed by Dr. P Martinique   HARDWARE REMOVAL Right 08/26/2017   Procedure: Right Lumbar Five Revision of pedicle screw with Removal of Lumbar Five Screw;  Surgeon: Kristeen Miss, MD;  Location: [facility];  Service: Neurosurgery;  Laterality: Right;  posterior   HARDWARE REMOVAL Right 12/27/2017   Procedure: Right Lumbar Two, Lumbar Three, Lumbar Four Pedicle screw removal with metrex;  Surgeon: Kristeen Miss, MD;  Location: [facility];  Service: Neurosurgery;  Laterality: Right;  Right L2 to L4 Pedicle screw removal with mterex   HEMORRHOID BANDING     LAPAROSCOPY N/A 09/01/2020   Procedure: LAPAROSCOPY DIAGNOSTIC;  Surgeon: Leighton Ruff,  MD;  Location: [facility];  Service: General;  Laterality: N/A;   MASS EXCISION  06/25/2012   Procedure: EXCISION MASS;  Surgeon: Wynonia Sours, MD;  Location: [facility];  Service: Orthopedics;  Laterality: Left;  transection of NEUROMA, BURYING RADIAL NERVE IN BRACHIORADIALIS LEFT SIDE   radial nerve Left    cut in work injury   RIGHT GREAT TOENAIL REMOVAL  04/2020   SPINAL FUSION     C6-7   TONSILLECTOMY  1950   UMBILICAL HERNIA REPAIR N/A 09/01/2020   Procedure: PRIMARY UMBILICAL HERNIA REPAIR;  Surgeon: Leighton Ruff, MD;  Location: [facility];  Service: General;  Laterality: N/A;   VASECTOMY       Current Outpatient Medications  Medication Sig Dispense Refill   acetaminophen (TYLENOL) 500 MG tablet Take 1,000  mg by mouth daily as needed for mild pain or headache.      amLODipine (NORVASC) 10 MG tablet TAKE 1 TABLET BY MOUTH EVERY DAY 90 tablet 3   aspirin EC 81 MG tablet Take 81 mg by mouth at bedtime.     atorvastatin (LIPITOR) 80 MG tablet TAKE 1 TABLET BY MOUTH EVERYDAY AT BEDTIME 90 tablet 3   cetirizine (ZYRTEC) 10 MG tablet Take 10 mg by mouth daily.     EPINEPHrine 0.3 mg/0.3 mL IJ SOAJ injection Inject 0.3 mg into the muscle as needed for anaphylaxis. 2 each 3   ferrous sulfate 325 (65 FE) MG EC tablet Take 325 mg by mouth 2 (two) times daily.     fluticasone (FLONASE) 50 MCG/ACT nasal spray Place 2 sprays into both nostrils daily. 48 g 1   hydrochlorothiazide (HYDRODIURIL) 50 MG tablet Take 1 tablet (50 mg total) by mouth daily. 90 tablet 3   HYDROcodone-acetaminophen (NORCO/VICODIN) 5-325 MG tablet Take 1 tablet by mouth every 6 (six) hours as needed for moderate pain. for pain     ketoconazole (NIZORAL) 2 % shampoo Apply 1 application topically 3 (three) times a week.     levothyroxine (SYNTHROID) 100 MCG tablet TAKE 1 TABLET BY MOUTH EVERY DAY 90 tablet 2   nitroGLYCERIN (NITROSTAT) 0.4 MG SL tablet Place 1 tablet (0.4 mg total) under the tongue every 5 (five) minutes as needed for chest pain. 25 tablet 2   ondansetron (ZOFRAN-ODT) 4 MG disintegrating tablet DISSOLVE 1 TABLET IN MOUTH EVERY 8 HOURS AS NEEDED FOR NAUSEA/ VOMITING 60 tablet 0   OVER THE COUNTER MEDICATION Calmosepatine ointment -use as directed     pantoprazole (PROTONIX) 40 MG tablet TAKE 1 TABLET BY MOUTH TWICE A DAY 180 tablet 1   PARoxetine (PAXIL) 20 MG tablet TAKE 1 TABLET BY MOUTH EVERY DAY 90 tablet 0   promethazine (PHENERGAN) 25 MG tablet TAKE 0.5-1 TABLETS (12.5-25 MG TOTAL) BY MOUTH 2 (TWO) TIMES DAILY AS NEEDED FOR NAUSEA OR VOMITING. 45 tablet 1   VENTOLIN HFA 108 (90 Base) MCG/ACT inhaler TAKE 2 PUFFS BY MOUTH EVERY 6 HOURS AS NEEDED FOR WHEEZE OR SHORTNESS OF BREATH 18 each 1   Current Facility-Administered  Medications  Medication Dose Route Frequency Provider Last Rate Last Admin   cyanocobalamin ((VITAMIN B-12)) injection 1,000 mcg  1,000 mcg Intramuscular Q30 days Gottschalk, Ashly M, DO   1,000 mcg at 01/22/22 9326    Allergies:   Alpha-gal, Beef (bovine) protein, Beef-derived products, Lambs quarters, Pork-derived products, Prednisone, Brilinta [ticagrelor], Doxycycline, Gabapentin, Lyrica [pregabalin], Methylprednisolone sodium succ, Penicillins, Shellfish allergy, Adhesive [tape], and Fentanyl    ROS:  Please see the  history of present illness.   Otherwise, review of systems are positive for none.    All other systems are reviewed and negative.    PHYSICAL EXAM: VS:  BP 120/72   Pulse 62   Ht '5\' 10"'$  (1.778 m)   Wt 213 lb (96.6 kg)   BMI 30.56 kg/m  , BMI Body mass index is 30.56 kg/m. GENERAL:  Well appearing NECK:  No jugular venous distention, waveform within normal limits, carotid upstroke brisk and symmetric, no bruits, no thyromegaly LUNGS:  Clear to auscultation bilaterally CHEST:  Unremarkable HEART:  PMI not displaced or sustained,S1 and S2 within normal limits, no S3, no S4, no clicks, no rubs, no murmurs ABD:  Flat, positive bowel sounds normal in frequency in pitch, no bruits, no rebound, no guarding, no midline pulsatile mass, no hepatomegaly, no splenomegaly EXT:  2 plus pulses throughout, no edema, no cyanosis no clubbing   EKG:  EKG is  ordered today. Sinus rhythm, rate 62, axis within normal limits, intervals within normal limits, no acute ST-T wave changes.   Recent Labs: 02/20/2021: ALT 34 08/22/2021: Hemoglobin 13.6; Platelets 253 02/02/2022: TSH 7.420    Lipid Panel    Component Value Date/Time   CHOL 130 12/20/2021 0851   CHOL 136 10/29/2012 0922   TRIG 184 (H) 12/20/2021 0851   TRIG 139 04/28/2013 1505   TRIG 103 10/29/2012 0922   HDL 41 12/20/2021 0851   HDL 44 04/28/2013 1505   HDL 41 10/29/2012 0922   CHOLHDL 3.2 12/20/2021 0851   LDLCALC 58  12/20/2021 0851   LDLCALC 50 04/28/2013 1505   LDLCALC 74 10/29/2012 0922      Wt Readings from Last 3 Encounters:  02/14/22 213 lb (96.6 kg)  12/20/21 209 lb 3.2 oz (94.9 kg)  08/30/21 208 lb (94.3 kg)      Other studies Reviewed: Additional studies/ records that were reviewed today include: Labs Review of the above records demonstrates:  NA   ASSESSMENT AND PLAN:  CAD:   The patient has no new sypmtoms.  No further cardiovascular testing is indicated.  We will continue with aggressive risk reduction and meds as listed.    Essential hypertension:   The blood pressure is at target.  No change in therapy.    Dyslipidemia: LDL was 58 with an HDL of 41.  No change in therapy.   Preop: The patient is a high functional level.  He is not going for high risk procedure.  He has no high risk findings or symptoms.  Therefore, based on ACC/AHA guidelines no further cardiovascular testing is suggested.  He would be at acceptable risk for the planned procedure.  Current medicines are reviewed at length with the patient today.  The patient does not have concerns regarding medicines.  The following changes have been made: None   Labs/ tests ordered today include: None  Orders Placed This Encounter  Procedures   EKG 12-Lead      Disposition:   FU with me in 12 months    Signed, Minus Breeding, MD  02/14/2022 5:13 PM    [facility]

## 2022-02-14 ENCOUNTER — Ambulatory Visit (INDEPENDENT_AMBULATORY_CARE_PROVIDER_SITE_OTHER): Payer: Medicare Other | Admitting: Cardiology

## 2022-02-14 ENCOUNTER — Encounter: Payer: Self-pay | Admitting: Cardiology

## 2022-02-14 VITALS — BP 120/72 | HR 62 | Ht 70.0 in | Wt 213.0 lb

## 2022-02-14 DIAGNOSIS — I2510 Atherosclerotic heart disease of native coronary artery without angina pectoris: Secondary | ICD-10-CM

## 2022-02-14 DIAGNOSIS — Z9861 Coronary angioplasty status: Secondary | ICD-10-CM

## 2022-02-14 DIAGNOSIS — R002 Palpitations: Secondary | ICD-10-CM

## 2022-02-14 DIAGNOSIS — I10 Essential (primary) hypertension: Secondary | ICD-10-CM | POA: Diagnosis not present

## 2022-02-14 DIAGNOSIS — E785 Hyperlipidemia, unspecified: Secondary | ICD-10-CM

## 2022-02-14 NOTE — Patient Instructions (Signed)

## 2022-02-21 ENCOUNTER — Ambulatory Visit (INDEPENDENT_AMBULATORY_CARE_PROVIDER_SITE_OTHER): Payer: Medicare Other | Admitting: *Deleted

## 2022-02-21 DIAGNOSIS — E538 Deficiency of other specified B group vitamins: Secondary | ICD-10-CM | POA: Diagnosis not present

## 2022-02-21 NOTE — Progress Notes (Signed)
Patient in today for monthly B 12 injection. 1000 mcg given IM in left deltoid. Patient tolerated well.  

## 2022-03-02 ENCOUNTER — Other Ambulatory Visit: Payer: Self-pay | Admitting: Neurological Surgery

## 2022-03-02 ENCOUNTER — Other Ambulatory Visit (HOSPITAL_COMMUNITY): Payer: Self-pay | Admitting: Neurological Surgery

## 2022-03-02 DIAGNOSIS — M5416 Radiculopathy, lumbar region: Secondary | ICD-10-CM

## 2022-03-02 DIAGNOSIS — M546 Pain in thoracic spine: Secondary | ICD-10-CM

## 2022-03-02 DIAGNOSIS — M5412 Radiculopathy, cervical region: Secondary | ICD-10-CM

## 2022-03-07 ENCOUNTER — Other Ambulatory Visit: Payer: Self-pay

## 2022-03-07 ENCOUNTER — Ambulatory Visit (HOSPITAL_COMMUNITY)
Admission: RE | Admit: 2022-03-07 | Discharge: 2022-03-07 | Disposition: A | Discharge status: Home / Self Care | Payer: Medicare Other | Source: Ambulatory Visit | Attending: Neurological Surgery | Admitting: Neurological Surgery

## 2022-03-07 DIAGNOSIS — M5416 Radiculopathy, lumbar region: Secondary | ICD-10-CM | POA: Insufficient documentation

## 2022-03-07 DIAGNOSIS — M546 Pain in thoracic spine: Secondary | ICD-10-CM | POA: Insufficient documentation

## 2022-03-07 DIAGNOSIS — M5412 Radiculopathy, cervical region: Secondary | ICD-10-CM

## 2022-03-07 MED ORDER — HYDROCODONE-ACETAMINOPHEN 5-325 MG PO TABS
1.0000 | ORAL_TABLET | ORAL | Status: DC | PRN
  Administered 2022-03-07: 1 via ORAL

## 2022-03-07 MED ORDER — DIAZEPAM 5 MG PO TABS
10.0000 mg | ORAL_TABLET | Freq: Once | ORAL | Status: AC
Start: 2022-03-07 — End: 2022-03-07

## 2022-03-07 MED ORDER — DIAZEPAM 5 MG PO TABS
ORAL_TABLET | ORAL | Status: AC
  Administered 2022-03-07: 10 mg via ORAL
  Filled 2022-03-07: qty 2

## 2022-03-07 MED ORDER — IOHEXOL 300 MG/ML  SOLN
10.0000 mL | Freq: Once | INTRAMUSCULAR | Status: AC | PRN
  Administered 2022-03-07: 9 mL via INTRATHECAL

## 2022-03-07 MED ORDER — LIDOCAINE HCL (PF) 1 % IJ SOLN
5.0000 mL | Freq: Once | INTRAMUSCULAR | Status: AC
  Administered 2022-03-07: 1 mL via INTRADERMAL

## 2022-03-07 MED ORDER — ONDANSETRON HCL 4 MG/2ML IJ SOLN
4.0000 mg | Freq: Four times a day (QID) | INTRAMUSCULAR | Status: DC | PRN
Start: 2022-03-07 — End: 2022-03-08

## 2022-03-07 MED ORDER — HYDROCODONE-ACETAMINOPHEN 5-325 MG PO TABS
ORAL_TABLET | ORAL | Status: AC
  Filled 2022-03-07: qty 1

## 2022-03-07 NOTE — Procedures (Signed)
Mr. Lexx Monte is a 61 year old individual whose had a traumatic injury to his thoracolumbar spine years ago.  He underwent surgical fusion but has developed considerable adjacent level disease at multiple levels in the lumbar spine ultimately creating a arthrodesis across the thoracolumbar junction.  The patient has severe spondylitic disease throughout the spine has had treatment with a spinal cord stimulator.  She had worsening back in addition to neck and shoulder pain and an MRI was desired however because of his spinal cord stimulator this could not be performed.  A total myelogram is now being performed to evaluate his spine tomorrow sterile fashion.  Pre op Dx: Chronic myelopathy with neck shoulder and back pain Post op Dx: Same Procedure: Total myelogram Surgeon: Brennan Karam Puncture level: L3-4 Fluid color: Clear colorless Injection: Iohexol 300, 9 mL Findings: Moderate spondylitic changes throughout the lumbar spine at the thoracolumbar junction.  The dye pins at his travel towards the cervical spine further evaluation reviewed the CT scan.

## 2022-03-21 ENCOUNTER — Ambulatory Visit (INDEPENDENT_AMBULATORY_CARE_PROVIDER_SITE_OTHER): Payer: Medicare Other | Admitting: Family Medicine

## 2022-03-21 DIAGNOSIS — E538 Deficiency of other specified B group vitamins: Secondary | ICD-10-CM

## 2022-03-23 ENCOUNTER — Ambulatory Visit (INDEPENDENT_AMBULATORY_CARE_PROVIDER_SITE_OTHER): Payer: Medicare Other | Admitting: Family Medicine

## 2022-03-23 ENCOUNTER — Encounter: Payer: Self-pay | Admitting: Family Medicine

## 2022-03-23 VITALS — BP 134/81 | HR 54 | Temp 97.8°F | Ht 70.5 in | Wt 212.6 lb

## 2022-03-23 DIAGNOSIS — R110 Nausea: Secondary | ICD-10-CM

## 2022-03-23 DIAGNOSIS — E039 Hypothyroidism, unspecified: Secondary | ICD-10-CM

## 2022-03-23 DIAGNOSIS — I2510 Atherosclerotic heart disease of native coronary artery without angina pectoris: Secondary | ICD-10-CM | POA: Diagnosis not present

## 2022-03-23 DIAGNOSIS — R1012 Left upper quadrant pain: Secondary | ICD-10-CM

## 2022-03-23 DIAGNOSIS — Z9861 Coronary angioplasty status: Secondary | ICD-10-CM

## 2022-03-23 DIAGNOSIS — I6523 Occlusion and stenosis of bilateral carotid arteries: Secondary | ICD-10-CM

## 2022-03-23 DIAGNOSIS — I700 Atherosclerosis of aorta: Secondary | ICD-10-CM | POA: Insufficient documentation

## 2022-03-23 DIAGNOSIS — I10 Essential (primary) hypertension: Secondary | ICD-10-CM | POA: Diagnosis not present

## 2022-03-23 DIAGNOSIS — K449 Diaphragmatic hernia without obstruction or gangrene: Secondary | ICD-10-CM

## 2022-03-23 MED ORDER — ONDANSETRON 4 MG PO TBDP: 4.0000 mg | ORAL_TABLET | Freq: Three times a day (TID) | ORAL | 0 refills | Status: DC | PRN

## 2022-03-23 NOTE — Progress Notes (Signed)
Subjective: CC: Follow-up hypothyroidism PCP: Janora Norlander, DO ZOX:WRUEA D Gladu is a 61 y.o. male presenting to clinic today for:  1.  Hypothyroidism Patient is compliant with thyroid medication.  He denies any missed doses.  No use of biotin containing products but does use something called cortexi for tinnitus, which has not been helping so he plans on discontinuing it.  No reports of difficulty swallowing, tremor, heart palpitations  2.  Hypertension with history of CAD status post angioplasty Patient is compliant with all medications as directed by his cardiologist.  No chest pain, shortness of breath.  He does report blood pressures will occasionally run above 540 systolic at home.  He reports occasional edema that he uses as needed Lasix for.  Denies any excessive salt intake or change in physical activity.  He also had some mild carotid calcification noted on the CAT scan as well as aortic atherosclerosis  He is not very active due to the chronic back issues.  He will be seeing his spinal surgeon soon on 5 September for fusion of the C4 and C5.  3.  Chronic nausea and left upper quadrant pain Patient with ongoing nausea.  He usually has some level of nausea every day that is mild and he uses Zofran for this.  He has not talked much more about this with his gastroenterologist but notes that there was a hiatal hernia noted on his CAT scan and wonders if this could potentially be the cause.  No changes in bowel habits.  No blood in stool.  No projectile vomiting reported.   ROS: Per HPI  Allergies  Allergen Reactions   Alpha-Gal Rash and Other (See Comments)    SEVERE ALLERGY TO ANY MEAT OR MEAT DERIVED PRODUCTS FROM 4 LEGGED ANIMALS > > BEEF, PORK , GOATS, DEER, ETC. < < RESULT OF BITE FROM LONE STAR TICK   Beef (Bovine) Protein Rash and Other (See Comments)   Beef-Derived Products Rash and Other (See Comments)   Lambs Quarters Rash and Other (See Comments)    Pork-Derived Products Rash and Other (See Comments)   Prednisone Other (See Comments)    im depo medrol caused dizziness - pt passed out. Ask pt before giving. Pt can take oral prednisone    Brilinta [Ticagrelor] Hives    Pt ate pork on the same day he took Brilinta before knowing he had alpha gal. May have been the alpha gal reaction, pt is unsure.    Doxycycline Hives   Gabapentin Swelling   Lyrica [Pregabalin]     Dry mouth and felt raw   Methylprednisolone Sodium Succ     im depo medrol caused dizziness - pt passed out. Ask pt before giving. Issue occurred when administered in the left arm    Penicillins Hives    Did it involve swelling of the face/tongue/throat, SOB, or low BP? No Did it involve sudden or severe rash/hives, skin peeling, or any reaction on the inside of your mouth or nose? No Did you need to seek medical attention at a hospital or doctor's office? No When did it last happen?      10 + years ago If all above answers are "NO", may proceed with cephalosporin use.    Shellfish Allergy Hives   Adhesive [Tape] Rash   Fentanyl Rash    Reaction to adhesive, not the drug    Past Medical History:  Diagnosis Date   Allergy    Allergy to alpha-gal  Anxiety    Arthritis    CAD (coronary artery disease), native coronary artery 12/03/2015   Coronary artery disease    Dysrhythmia    "irregular and PAC'S"   GERD (gastroesophageal reflux disease)    Head injury with loss of consciousness ([place])    back in the 1980's   History of kidney stones    Hyperlipidemia    Hypertension    Hypothyroid    Multiple benign nevi    Neuromuscular disorder ([place]) 2011   LEFT RADIAL NERVE SURGERY R/T TRAUMA   Pneumonia 2014, 2017   Prostatism    Pulmonary nodule    Spinal cord stimulator status    told to bring remote to surgery    Current Outpatient Medications:    acetaminophen (TYLENOL) 500 MG tablet, Take 1,000 mg by mouth daily as needed for mild pain or headache. , Disp: ,  Rfl:    amLODipine (NORVASC) 10 MG tablet, TAKE 1 TABLET BY MOUTH EVERY DAY, Disp: 90 tablet, Rfl: 3   aspirin EC 81 MG tablet, Take 81 mg by mouth at bedtime., Disp: , Rfl:    atorvastatin (LIPITOR) 80 MG tablet, TAKE 1 TABLET BY MOUTH EVERYDAY AT BEDTIME, Disp: 90 tablet, Rfl: 3   betamethasone dipropionate 0.05 % cream, Apply 1 Application topically 2 (two) times daily as needed (swelling/ itching)., Disp: , Rfl:    cetirizine (ZYRTEC) 10 MG tablet, Take 10 mg by mouth daily., Disp: , Rfl:    Cyanocobalamin (B-12 COMPLIANCE INJECTION) 1000 MCG/ML KIT, Inject 1,000 mcg as directed every 30 (thirty) days., Disp: , Rfl:    EPINEPHrine 0.3 mg/0.3 mL IJ SOAJ injection, Inject 0.3 mg into the muscle as needed for anaphylaxis., Disp: 2 each, Rfl: 3   ferrous sulfate 325 (65 FE) MG EC tablet, Take 325 mg by mouth 2 (two) times daily., Disp: , Rfl:    fluticasone (FLONASE) 50 MCG/ACT nasal spray, Place 2 sprays into both nostrils daily., Disp: 48 g, Rfl: 1   hydrochlorothiazide (HYDRODIURIL) 50 MG tablet, Take 1 tablet (50 mg total) by mouth daily., Disp: 90 tablet, Rfl: 3   HYDROcodone-acetaminophen (NORCO) 7.5-325 MG tablet, Take 1 tablet by mouth every 6 (six) hours as needed for moderate pain., Disp: , Rfl:    levothyroxine (SYNTHROID) 100 MCG tablet, TAKE 1 TABLET BY MOUTH EVERY DAY, Disp: 90 tablet, Rfl: 2   nitroGLYCERIN (NITROSTAT) 0.4 MG SL tablet, Place 1 tablet (0.4 mg total) under the tongue every 5 (five) minutes as needed for chest pain., Disp: 25 tablet, Rfl: 2   ondansetron (ZOFRAN-ODT) 4 MG disintegrating tablet, DISSOLVE 1 TABLET IN MOUTH EVERY 8 HOURS AS NEEDED FOR NAUSEA/ VOMITING, Disp: 60 tablet, Rfl: 0   OVER THE COUNTER MEDICATION, Take 1 Dose by mouth daily. Cortexi otc liquid supplement, Disp: , Rfl:    pantoprazole (PROTONIX) 40 MG tablet, TAKE 1 TABLET BY MOUTH TWICE A DAY, Disp: 180 tablet, Rfl: 1   PARoxetine (PAXIL) 20 MG tablet, TAKE 1 TABLET BY MOUTH EVERY DAY (Patient  taking differently: Take 20 mg by mouth at bedtime.), Disp: 90 tablet, Rfl: 0   promethazine (PHENERGAN) 25 MG tablet, TAKE 0.5-1 TABLETS (12.5-25 MG TOTAL) BY MOUTH 2 (TWO) TIMES DAILY AS NEEDED FOR NAUSEA OR VOMITING., Disp: 45 tablet, Rfl: 1   VENTOLIN HFA 108 (90 Base) MCG/ACT inhaler, TAKE 2 PUFFS BY MOUTH EVERY 6 HOURS AS NEEDED FOR WHEEZE OR SHORTNESS OF BREATH, Disp: 18 each, Rfl: 1  Current Facility-Administered Medications:  cyanocobalamin ((VITAMIN B-12)) injection 1,000 mcg, 1,000 mcg, Intramuscular, Q30 days, Ronnie Doss M, DO, 1,000 mcg at 03/21/22 8675 Social History   Socioeconomic History   Marital status: Married    Spouse name: Not on file   Number of children: 3   Years of education: Not on file   Highest education level: Not on file  Occupational History   Occupation: [employer]  Tobacco Use   Smoking status: Former    Packs/day: 1.00    Years: 20.00    Total pack years: 20.00    Types: Cigarettes    Quit date: 11/28/2006    Years since quitting: 15.3   Smokeless tobacco: Never  Vaping Use   Vaping Use: Never used  Substance and Sexual Activity   Alcohol use: Not Currently    Alcohol/week: 0.0 standard drinks of alcohol    Comment: rarely   Drug use: No   Sexual activity: Not Currently  Other Topics Concern   Not on file  Social History Narrative   Lives at home with wife, not working disabled from the city of [place]   They have an adopted son at home (age 63  2022)   Former smoker no alcohol tobacco or drug use now   Social Determinants of Radio broadcast assistant Strain: [place]  (07/11/2021)   Overall Financial Resource Strain (CARDIA)    Difficulty of Paying Living Expenses: Not hard at all  Food Insecurity: No Food Insecurity (07/11/2021)   Hunger Vital Sign    Worried About Running Out of Food in the Last Year: Never true    [place] in the Last Year: Never true  Transportation Needs: No Transportation  Needs (07/11/2021)   PRAPARE - Hydrologist (Medical): No    Lack of Transportation (Non-Medical): No  Physical Activity: Insufficiently Active (07/11/2021)   Exercise Vital Sign    Days of Exercise per Week: 7 days    Minutes of Exercise per Session: 20 min  Stress: No Stress Concern Present (07/11/2021)   [place]    Feeling of Stress : Not at all  Social Connections: [place] (07/11/2021)   Social Connection and Isolation Panel [NHANES]    Frequency of Communication with Friends and Family: More than three times a week    Frequency of Social Gatherings with Friends and Family: More than three times a week    Attends Religious Services: More than 4 times per year    Active Member of [employer] or Organizations: Yes    Attends Music therapist: More than 4 times per year    Marital Status: Married  Human resources officer Violence: Not At Risk (07/11/2021)   Humiliation, Afraid, Rape, and Kick questionnaire    Fear of Current or Ex-Partner: No    Emotionally Abused: No    Physically Abused: No    Sexually Abused: No   Family History  Problem Relation Age of Onset   Heart failure Mother 45       Died age 8   Congestive Heart Failure Mother    Heart attack Father 32       Died with MI   Heart attack Paternal Grandmother    Heart attack Paternal Grandfather    Colon cancer Neg Hx     Objective: Office vital signs reviewed. BP (!) 150/82   Pulse (!) 54   Temp 97.8  F (36.6 C)   Ht 5' 10.5" (1.791 m)   Wt 212 lb 9.6 oz (96.4 kg)   SpO2 99%   BMI 30.07 kg/m   Physical Examination:  General: Awake, alert, well nourished, No acute distress HEENT: Sclera white Cardio: regular rate and rhythm, S1S2 heard, no murmurs appreciated Pulm: clear to auscultation bilaterally, no wheezes, rhonchi or rales; normal work of breathing on room air GI: Soft,  nondistended Extremities: warm, well perfused, No edema, cyanosis or clubbing; +2 pulses bilaterally MSK: Ambulating with use of cane  Assessment/ Plan: 61 y.o. male   Acquired hypothyroidism - Plan: TSH, T4, Free, TSH, T4, Free, CANCELED: TSH, CANCELED: T4, Free  CAD S/P percutaneous coronary angioplasty - Plan: CANCELED: Basic metabolic panel  Essential hypertension - Plan: CANCELED: Basic metabolic panel  LUQ pain - Plan: ondansetron (ZOFRAN-ODT) 4 MG disintegrating tablet  Nausea - Plan: ondansetron (ZOFRAN-ODT) 4 MG disintegrating tablet  Carotid artery calcification, bilateral - Plan: US Carotid Duplex Bilateral  Aortic atherosclerosis (HCC)  Hiatal hernia  Check thyroid levels.  No biotin containing products.  Taking medications appropriately.  May need to advance his thyroid medication dose  Blood pressure was controlled upon recheck.  No additional medicines ordered at this time  The abdominal pain and nausea are likely secondary to the small hiatal hernia noted on recent CT Thoracics.  I will CC his gastroenterologist as Juluis Rainier as he still continues to require fairly regular use of Zofran.  We discussed needing follow-up on this with GI  I did review the CT of his C-spine which did denote some mild plaque buildup in the carotids.  I will further quantify this with ultrasound given his history of CAD which required previous coronary angioplasty.  We discussed control of blood pressure, cholesterol and sugar to prevent any further calcification and buildup.  Aortic atherosclerosis noted also on imaging  No orders of the defined types were placed in this encounter.  No orders of the defined types were placed in this encounter.    Janora Norlander, DO [place] [phone]

## 2022-03-23 NOTE — Patient Instructions (Signed)
Goal BP is <150/90.  Your repeat BP was good after recheck.  Continue monitoring BP. If the first one is high, wait and recheck after a few minutes of resting.

## 2022-03-24 LAB — TSH: TSH: 9.87 u[IU]/mL — ABNORMAL HIGH (ref 0.450–4.500)

## 2022-03-24 LAB — T4, FREE: Free T4: 1.78 ng/dL — ABNORMAL HIGH (ref 0.82–1.77)

## 2022-04-13 ENCOUNTER — Other Ambulatory Visit: Payer: Self-pay | Admitting: Family Medicine

## 2022-04-13 ENCOUNTER — Other Ambulatory Visit: Payer: Self-pay | Admitting: Nurse Practitioner

## 2022-04-13 DIAGNOSIS — F411 Generalized anxiety disorder: Secondary | ICD-10-CM

## 2022-04-13 DIAGNOSIS — K219 Gastro-esophageal reflux disease without esophagitis: Secondary | ICD-10-CM

## 2022-04-13 DIAGNOSIS — R1012 Left upper quadrant pain: Secondary | ICD-10-CM

## 2022-04-13 DIAGNOSIS — R110 Nausea: Secondary | ICD-10-CM

## 2022-04-13 DIAGNOSIS — E039 Hypothyroidism, unspecified: Secondary | ICD-10-CM

## 2022-04-13 NOTE — Telephone Encounter (Signed)
Last office visit 03/23/22 Last refill 03/23/22, #60 no refills

## 2022-04-23 ENCOUNTER — Ambulatory Visit (INDEPENDENT_AMBULATORY_CARE_PROVIDER_SITE_OTHER): Payer: Medicare Other | Admitting: *Deleted

## 2022-04-23 DIAGNOSIS — Z23 Encounter for immunization: Secondary | ICD-10-CM

## 2022-04-23 DIAGNOSIS — E538 Deficiency of other specified B group vitamins: Secondary | ICD-10-CM

## 2022-04-23 NOTE — Progress Notes (Signed)
Pt in for B12 injection. Given in left deltoid, also got flu shot in right deltoid.

## 2022-05-09 ENCOUNTER — Other Ambulatory Visit: Payer: Self-pay | Admitting: Family Medicine

## 2022-05-09 DIAGNOSIS — I2510 Atherosclerotic heart disease of native coronary artery without angina pectoris: Secondary | ICD-10-CM

## 2022-05-24 ENCOUNTER — Ambulatory Visit (INDEPENDENT_AMBULATORY_CARE_PROVIDER_SITE_OTHER): Payer: Medicare Other

## 2022-05-24 ENCOUNTER — Ambulatory Visit (HOSPITAL_COMMUNITY): Admission: RE | Admit: 2022-05-24 | Payer: Medicare Other | Source: Ambulatory Visit

## 2022-05-24 DIAGNOSIS — E538 Deficiency of other specified B group vitamins: Secondary | ICD-10-CM | POA: Diagnosis not present

## 2022-05-24 NOTE — Progress Notes (Signed)
Cyanocobalamin injection given to right deltoid.  Patient tolerated well. 

## 2022-05-28 ENCOUNTER — Telehealth: Payer: Self-pay | Admitting: Family Medicine

## 2022-05-28 DIAGNOSIS — E039 Hypothyroidism, unspecified: Secondary | ICD-10-CM

## 2022-05-28 NOTE — Telephone Encounter (Signed)
Talked w/ pt about Levothyroxyine Rx being a 30 day supply, in looking at chart his last labs in Aug were abnormal and he was to have them rechecked in 6 wks therefore his last refill was only done for 30. Pt just got a 30 day refill, so placed future orders in for TSH & T4, he will try to get in this week for these to be fone.

## 2022-05-29 ENCOUNTER — Other Ambulatory Visit: Payer: Medicare Other

## 2022-05-29 DIAGNOSIS — E039 Hypothyroidism, unspecified: Secondary | ICD-10-CM

## 2022-05-30 ENCOUNTER — Other Ambulatory Visit: Payer: Self-pay | Admitting: Family Medicine

## 2022-05-30 DIAGNOSIS — E039 Hypothyroidism, unspecified: Secondary | ICD-10-CM

## 2022-05-30 LAB — T4, FREE: Free T4: 1.57 ng/dL (ref 0.82–1.77)

## 2022-05-30 LAB — TSH: TSH: 3.99 u[IU]/mL (ref 0.450–4.500)

## 2022-05-30 MED ORDER — LEVOTHYROXINE SODIUM 100 MCG PO TABS: 100.0000 ug | ORAL_TABLET | Freq: Every day | ORAL | 1 refills | Status: DC

## 2022-06-06 ENCOUNTER — Ambulatory Visit (HOSPITAL_COMMUNITY)
Admission: RE | Admit: 2022-06-06 | Discharge: 2022-06-06 | Disposition: A | Discharge status: Home / Self Care | Payer: Medicare Other | Source: Ambulatory Visit | Attending: Family Medicine | Admitting: Family Medicine

## 2022-06-06 DIAGNOSIS — I6523 Occlusion and stenosis of bilateral carotid arteries: Secondary | ICD-10-CM | POA: Insufficient documentation

## 2022-06-25 ENCOUNTER — Ambulatory Visit (INDEPENDENT_AMBULATORY_CARE_PROVIDER_SITE_OTHER): Payer: Medicare Other | Admitting: *Deleted

## 2022-06-25 DIAGNOSIS — E538 Deficiency of other specified B group vitamins: Secondary | ICD-10-CM

## 2022-06-25 NOTE — Progress Notes (Signed)
Vitamin b12 injection given and patient tolerated well.  

## 2022-07-12 ENCOUNTER — Ambulatory Visit (INDEPENDENT_AMBULATORY_CARE_PROVIDER_SITE_OTHER): Payer: Medicare Other

## 2022-07-12 VITALS — Ht 70.0 in | Wt 210.0 lb

## 2022-07-12 DIAGNOSIS — Z0000 Encounter for general adult medical examination without abnormal findings: Secondary | ICD-10-CM | POA: Diagnosis not present

## 2022-07-12 NOTE — Patient Instructions (Signed)
Jeffrey Hooper , Thank you for taking time to come for your Medicare Wellness Visit. I appreciate your ongoing commitment to your health goals. Please review the following plan we discussed and let me know if I can assist you in the future.   These are the goals we discussed:  Goals      Prevent falls        This is a list of the screening recommended for you and due dates:  Health Maintenance  Topic Date Due   COVID-19 Vaccine (3 - 2023-24 season) 04/06/2022   Medicare Annual Wellness Visit  07/13/2023   DTaP/Tdap/Td vaccine (3 - Td or Tdap) 05/16/2030   Colon Cancer Screening  12/14/2030   Flu Shot  Completed   Hepatitis C Screening: USPSTF Recommendation to screen - Ages 18-79 yo.  Completed   HIV Screening  Completed   Zoster (Shingles) Vaccine  Completed   HPV Vaccine  Aged Out    Advanced directives: Advance directive discussed with you today. I have provided a copy for you to complete at home and have notarized. Once this is complete please bring a copy in to our office so we can scan it into your chart.   Conditions/risks identified: Aim for 30 minutes of exercise or brisk walking, 6-8 glasses of water, and 5 servings of fruits and vegetables each day.   Next appointment: Follow up in one year for your annual wellness visit.   Preventive Care 23 Years and Older, Male  Preventive care refers to lifestyle choices and visits with your health care provider that can promote health and wellness. What does preventive care include? A yearly physical exam. This is also called an annual well check. Dental exams once or twice a year. Routine eye exams. Ask your health care provider how often you should have your eyes checked. Personal lifestyle choices, including: Daily care of your teeth and gums. Regular physical activity. Eating a healthy diet. Avoiding tobacco and drug use. Limiting alcohol use. Practicing safe sex. Taking low doses of aspirin every day. Taking vitamin and  mineral supplements as recommended by your health care provider. What happens during an annual well check? The services and screenings done by your health care provider during your annual well check will depend on your age, overall health, lifestyle risk factors, and family history of disease. Counseling  Your health care provider may ask you questions about your: Alcohol use. Tobacco use. Drug use. Emotional well-being. Home and relationship well-being. Sexual activity. Eating habits. History of falls. Memory and ability to understand (cognition). Work and work Statistician. Screening  You may have the following tests or measurements: Height, weight, and BMI. Blood pressure. Lipid and cholesterol levels. These may be checked every 5 years, or more frequently if you are over 25 years old. Skin check. Lung cancer screening. You may have this screening every year starting at age 59 if you have a 30-pack-year history of smoking and currently smoke or have quit within the past 15 years. Fecal occult blood test (FOBT) of the stool. You may have this test every year starting at age 64. Flexible sigmoidoscopy or colonoscopy. You may have a sigmoidoscopy every 5 years or a colonoscopy every 10 years starting at age 72. Prostate cancer screening. Recommendations will vary depending on your family history and other risks. Hepatitis C blood test. Hepatitis B blood test. Sexually transmitted disease (STD) testing. Diabetes screening. This is done by checking your blood sugar (glucose) after you have not eaten for  a while (fasting). You may have this done every 1-3 years. Abdominal aortic aneurysm (AAA) screening. You may need this if you are a current or former smoker. Osteoporosis. You may be screened starting at age 51 if you are at high risk. Talk with your health care provider about your test results, treatment options, and if necessary, the need for more tests. Vaccines  Your health care  provider may recommend certain vaccines, such as: Influenza vaccine. This is recommended every year. Tetanus, diphtheria, and acellular pertussis (Tdap, Td) vaccine. You may need a Td booster every 10 years. Zoster vaccine. You may need this after age 43. Pneumococcal 13-valent conjugate (PCV13) vaccine. One dose is recommended after age 34. Pneumococcal polysaccharide (PPSV23) vaccine. One dose is recommended after age 37. Talk to your health care provider about which screenings and vaccines you need and how often you need them. This information is not intended to replace advice given to you by your health care provider. Make sure you discuss any questions you have with your health care provider. Document Released: 08/19/2015 Document Revised: 04/11/2016 Document Reviewed: 05/24/2015 Elsevier Interactive Patient Education  2017 [place] Prevention in the Home Falls can cause injuries. They can happen to people of all ages. There are many things you can do to make your home safe and to help prevent falls. What can I do on the outside of my home? Regularly fix the edges of walkways and driveways and fix any cracks. Remove anything that might make you trip as you walk through a door, such as a raised step or threshold. Trim any bushes or trees on the path to your home. Use bright outdoor lighting. Clear any walking paths of anything that might make someone trip, such as rocks or tools. Regularly check to see if handrails are loose or broken. Make sure that both sides of any steps have handrails. Any raised decks and porches should have guardrails on the edges. Have any leaves, snow, or ice cleared regularly. Use sand or salt on walking paths during winter. Clean up any spills in your garage right away. This includes oil or grease spills. What can I do in the bathroom? Use night lights. Install grab bars by the toilet and in the tub and shower. Do not use towel bars as grab  bars. Use non-skid mats or decals in the tub or shower. If you need to sit down in the shower, use a plastic, non-slip stool. Keep the floor dry. Clean up any water that spills on the floor as soon as it happens. Remove soap buildup in the tub or shower regularly. Attach bath mats securely with double-sided non-slip rug tape. Do not have throw rugs and other things on the floor that can make you trip. What can I do in the bedroom? Use night lights. Make sure that you have a light by your bed that is easy to reach. Do not use any sheets or blankets that are too big for your bed. They should not hang down onto the floor. Have a firm chair that has side arms. You can use this for support while you get dressed. Do not have throw rugs and other things on the floor that can make you trip. What can I do in the kitchen? Clean up any spills right away. Avoid walking on wet floors. Keep items that you use a lot in easy-to-reach places. If you need to reach something above you, use a strong step stool that has a  grab bar. Keep electrical cords out of the way. Do not use floor polish or wax that makes floors slippery. If you must use wax, use non-skid floor wax. Do not have throw rugs and other things on the floor that can make you trip. What can I do with my stairs? Do not leave any items on the stairs. Make sure that there are handrails on both sides of the stairs and use them. Fix handrails that are broken or loose. Make sure that handrails are as long as the stairways. Check any carpeting to make sure that it is firmly attached to the stairs. Fix any carpet that is loose or worn. Avoid having throw rugs at the top or bottom of the stairs. If you do have throw rugs, attach them to the floor with carpet tape. Make sure that you have a light switch at the top of the stairs and the bottom of the stairs. If you do not have them, ask someone to add them for you. What else can I do to help prevent  falls? Wear shoes that: Do not have high heels. Have rubber bottoms. Are comfortable and fit you well. Are closed at the toe. Do not wear sandals. If you use a stepladder: Make sure that it is fully opened. Do not climb a closed stepladder. Make sure that both sides of the stepladder are locked into place. Ask someone to hold it for you, if possible. Clearly mark and make sure that you can see: Any grab bars or handrails. First and last steps. Where the edge of each step is. Use tools that help you move around (mobility aids) if they are needed. These include: Canes. Walkers. Scooters. Crutches. Turn on the lights when you go into a dark area. Replace any light bulbs as soon as they burn out. Set up your furniture so you have a clear path. Avoid moving your furniture around. If any of your floors are uneven, fix them. If there are any pets around you, be aware of where they are. Review your medicines with your doctor. Some medicines can make you feel dizzy. This can increase your chance of falling. Ask your doctor what other things that you can do to help prevent falls. This information is not intended to replace advice given to you by your health care provider. Make sure you discuss any questions you have with your health care provider. Document Released: 05/19/2009 Document Revised: 12/29/2015 Document Reviewed: 08/27/2014 Elsevier Interactive Patient Education  2017 [employer].

## 2022-07-12 NOTE — Progress Notes (Signed)
Subjective:   Jeffrey Hooper is a 61 y.o. male who presents for Medicare Annual/Subsequent preventive examination. I connected with  MARCAS BOWSHER on 07/12/22 by a audio enabled telemedicine application and verified that I am speaking with the correct person using two identifiers.  Patient Location: Home  Provider Location: Home Office  I discussed the limitations of evaluation and management by telemedicine. The patient expressed understanding and agreed to proceed.  Review of Systems         Objective:    Today's Vitals   07/12/22 0905  Weight: 210 lb (95.3 kg)  Height: _0  (1.778 m)   Body mass index is 30.13 kg/m.     07/12/2022    9:09 AM 03/07/2022    6:08 AM 07/11/2021    9:10 AM 09/01/2020    7:17 AM 08/25/2020    9:52 AM 10/29/2019    8:00 AM 10/27/2019   10:41 AM  Advanced Directives  Does Patient Have a Medical Advance Directive? _1  No No  Would patient like information on creating a medical advance directive? No - Patient declined No - Patient declined No - Patient declined No - Patient declined No - Patient declined No - Patient declined No - Patient declined    Current Medications (verified) Outpatient Encounter Medications as of 07/12/2022  Medication Sig   acetaminophen (TYLENOL) 500 MG tablet Take 1,000 mg by mouth daily as needed for mild pain or headache.    amLODipine (NORVASC) 10 MG tablet TAKE 1 TABLET BY MOUTH EVERY DAY   aspirin EC 81 MG tablet Take 81 mg by mouth at bedtime.   atorvastatin (LIPITOR) 80 MG tablet TAKE 1 TABLET BY MOUTH EVERYDAY AT BEDTIME   betamethasone dipropionate 0.05 % cream Apply 1 Application topically 2 (two) times daily as needed (swelling/ itching).   cetirizine (ZYRTEC) 10 MG tablet Take 10 mg by mouth daily.   Cyanocobalamin (B-12 COMPLIANCE INJECTION) 1000 MCG/ML KIT Inject 1,000 mcg as directed every 30 (thirty) days.   EPINEPHrine 0.3 mg/0.3 mL IJ SOAJ injection Inject 0.3 mg into the muscle as needed  for anaphylaxis.   ferrous sulfate 325 (65 FE) MG EC tablet Take 325 mg by mouth 2 (two) times daily.   fluticasone (FLONASE) 50 MCG/ACT nasal spray Place 2 sprays into both nostrils daily.   hydrochlorothiazide (HYDRODIURIL) 50 MG tablet Take 1 tablet (50 mg total) by mouth daily.   HYDROcodone-acetaminophen (NORCO) 7.5-325 MG tablet Take 1 tablet by mouth every 6 (six) hours as needed for moderate pain.   levothyroxine (SYNTHROID) 100 MCG tablet Take 1 tablet (100 mcg total) by mouth daily.   nitroGLYCERIN (NITROSTAT) 0.4 MG SL tablet PLACE 1 TABLET UNDER THE TONGUE EVERY 5 MINUTES AS NEEDED FOR CHEST PAIN.   ondansetron (ZOFRAN-ODT) 4 MG disintegrating tablet TAKE 1 TABLET BY MOUTH EVERY 8 HOURS AS NEEDED FOR NAUSEA AND VOMITING   OVER THE COUNTER MEDICATION Take 1 Dose by mouth daily. Cortexi otc liquid supplement   pantoprazole (PROTONIX) 40 MG tablet TAKE 1 TABLET BY MOUTH TWICE A DAY   PARoxetine (PAXIL) 20 MG tablet TAKE 1 TABLET BY MOUTH EVERY DAY   promethazine (PHENERGAN) 25 MG tablet TAKE 0.5-1 TABLETS (12.5-25 MG TOTAL) BY MOUTH 2 (TWO) TIMES DAILY AS NEEDED FOR NAUSEA OR VOMITING.   VENTOLIN HFA 108 (90 Base) MCG/ACT inhaler TAKE 2 PUFFS BY MOUTH EVERY 6 HOURS AS NEEDED FOR WHEEZE OR SHORTNESS OF BREATH   Facility-Administered Encounter Medications as  of 07/12/2022  Medication   cyanocobalamin ((VITAMIN B-12)) injection 1,000 mcg    Allergies (verified) Alpha-gal, Beef (bovine) protein, Beef-derived products, Lambs quarters, Pork-derived products, Prednisone, Brilinta [ticagrelor], Doxycycline, Gabapentin, Lyrica [pregabalin], Methylprednisolone sodium succ, Penicillins, Shellfish allergy, Adhesive [tape], and Fentanyl   History: Past Medical History:  Diagnosis Date   Allergy    Allergy to alpha-gal    Anxiety    Arthritis    CAD (coronary artery disease), native coronary artery 12/03/2015   Coronary artery disease    Dysrhythmia    "irregular and PAC'S"   GERD  (gastroesophageal reflux disease)    Head injury with loss of consciousness ([place])    back in the 1980's   History of kidney stones    Hyperlipidemia    Hypertension    Hypothyroid    Multiple benign nevi    Neuromuscular disorder ([place]) 2011   LEFT RADIAL NERVE SURGERY R/T TRAUMA   Pneumonia 2014, 2017   Prostatism    Pulmonary nodule    Spinal cord stimulator status    told to bring remote to surgery   Past Surgical History:  Procedure Laterality Date   BACK SURGERY     T12 - L1, 11 times including neck   CARDIAC CATHETERIZATION N/A 12/02/2015   Procedure: Left Heart Cath and Coronary Angiography;  Surgeon: Peter M Martinique, MD;  Location: [facility];  Service: Cardiovascular;  Laterality: N/A;   CARDIAC CATHETERIZATION N/A 12/02/2015   Procedure: Coronary Stent Intervention;  Surgeon: Peter M Martinique, MD;  Location: [facility];  Service: Cardiovascular;  Laterality: N/A;  mid LAD Promus 2.5x12   COLONOSCOPY     CORONARY ANGIOPLASTY     one stent placed by Dr. P Martinique   HARDWARE REMOVAL Right 08/26/2017   Procedure: Right Lumbar Five Revision of pedicle screw with Removal of Lumbar Five Screw;  Surgeon: Kristeen Miss, MD;  Location: [facility];  Service: Neurosurgery;  Laterality: Right;  posterior   HARDWARE REMOVAL Right 12/27/2017   Procedure: Right Lumbar Two, Lumbar Three, Lumbar Four Pedicle screw removal with metrex;  Surgeon: Kristeen Miss, MD;  Location: [facility];  Service: Neurosurgery;  Laterality: Right;  Right L2 to L4 Pedicle screw removal with mterex   HEMORRHOID BANDING     LAPAROSCOPY N/A 09/01/2020   Procedure: LAPAROSCOPY DIAGNOSTIC;  Surgeon: Leighton Ruff, MD;  Location: [facility];  Service: General;  Laterality: N/A;   MASS EXCISION  06/25/2012   Procedure: EXCISION MASS;  Surgeon: Wynonia Sours, MD;  Location: [facility];  Service: Orthopedics;  Laterality: Left;  transection of NEUROMA, BURYING RADIAL NERVE IN BRACHIORADIALIS LEFT SIDE    radial nerve Left    cut in work injury   RIGHT GREAT TOENAIL REMOVAL  04/2020   SPINAL FUSION     C6-7   TONSILLECTOMY  5361   UMBILICAL HERNIA REPAIR N/A 09/01/2020   Procedure: PRIMARY UMBILICAL HERNIA REPAIR;  Surgeon: Leighton Ruff, MD;  Location: [facility];  Service: General;  Laterality: N/A;   VASECTOMY     Family History  Problem Relation Age of Onset   Heart failure Mother 52       Died age 50   Congestive Heart Failure Mother    Heart attack Father 36       Died with MI   Heart attack Paternal Grandmother    Heart attack Paternal Grandfather    Colon cancer Neg Hx    Social History   Socioeconomic  History   Marital status: Married    Spouse name: Not on file   Number of children: 3   Years of education: Not on file   Highest education level: Not on file  Occupational History   Occupation: [employer]  Tobacco Use   Smoking status: Former    Packs/day: 1.00    Years: 20.00    Total pack years: 20.00    Types: Cigarettes    Quit date: 11/28/2006    Years since quitting: 15.6   Smokeless tobacco: Never  Vaping Use   Vaping Use: Never used  Substance and Sexual Activity   Alcohol use: Not Currently    Alcohol/week: 0.0 standard drinks of alcohol    Comment: rarely   Drug use: No   Sexual activity: Not Currently  Other Topics Concern   Not on file  Social History Narrative   Lives at home with wife, not working disabled from the city of [place]   They have an adopted son at home (age 42  2022)   Former smoker no alcohol tobacco or drug use now   Social Determinants of Radio broadcast assistant Strain: [place]  (07/12/2022)   Overall Financial Resource Strain (CARDIA)    Difficulty of Paying Living Expenses: Not hard at all  Food Insecurity: No Food Insecurity (07/12/2022)   Hunger Vital Sign    Worried About Running Out of Food in the Last Year: Never true    [place] in the Last Year: Never true  Transportation Needs: No  Transportation Needs (07/12/2022)   PRAPARE - Hydrologist (Medical): No    Lack of Transportation (Non-Medical): No  Physical Activity: Insufficiently Active (07/12/2022)   Exercise Vital Sign    Days of Exercise per Week: 5 days    Minutes of Exercise per Session: 10 min  Stress: No Stress Concern Present (07/12/2022)   [place]    Feeling of Stress : Not at all  Social Connections: [place] (07/12/2022)   Social Connection and Isolation Panel [NHANES]    Frequency of Communication with Friends and Family: More than three times a week    Frequency of Social Gatherings with Friends and Family: More than three times a week    Attends Religious Services: More than 4 times per year    Active Member of [employer] or Organizations: Yes    Attends Music therapist: More than 4 times per year    Marital Status: Married    Tobacco Counseling Counseling given: Not Answered   Clinical Intake:  Pre-visit preparation completed: Yes  Pain : No/denies pain     Nutritional Risks: None Diabetes: No  How often do you need to have someone help you when you read instructions, pamphlets, or other written materials from your doctor or pharmacy?: 1 - Never  Diabetic?no   Interpreter Needed?: No  Information entered by :: Jadene Pierini, LPN   Activities of Daily Living    07/12/2022    9:09 AM 03/07/2022    6:05 AM  In your present state of health, do you have any difficulty performing the following activities:  Hearing? 0 0  Vision? 0 0  Difficulty concentrating or making decisions? 0 0  Walking or climbing stairs? 0 0  Dressing or bathing? 0 0  Doing errands, shopping? 0   Preparing Food and eating ? N  Using the Toilet? N   In the past six months, have you accidently leaked urine? N   Do you have problems with loss of bowel control? N   Managing your Medications? N    Managing your Finances? N   Housekeeping or managing your Housekeeping? N     Patient Care Team: Janora Norlander, DO as PCP - General (Family Medicine) Minus Breeding, MD as PCP - Cardiology (Cardiology) Milus Banister, MD as Attending Physician (Gastroenterology) Kristeen Miss, MD as Consulting Physician (Neurosurgery) Celestia Khat, OD (Optometry)  Indicate any recent Medical Services you may have received from other than [facility] providers in the past year (date may be approximate).     Assessment:   This is a routine wellness examination for Anwar.  Hearing/Vision screen Vision Screening - Comments:: Wears rx glasses - up to date with routine eye exams with  Dr.Johnson   Dietary issues and exercise activities discussed: Current Exercise Habits: Home exercise routine, Type of exercise: walking, Time (Minutes): 10, Frequency (Times/Week): 5, Weekly Exercise (Minutes/Week): 50, Intensity: Mild, Exercise limited by: orthopedic condition(s)   Goals Addressed             This Visit's Progress    Prevent falls   On track      Depression Screen    07/12/2022    9:08 AM 03/23/2022    7:57 AM 12/20/2021    8:42 AM 08/22/2021   11:38 AM 07/11/2021    9:08 AM 05/19/2021    8:30 AM 03/22/2021    8:46 AM  PHQ 2/9 Scores  PHQ - 2 Score 0 0 0 0 0 0 0  PHQ- 9 Score  _0 Fall Risk    07/12/2022    9:06 AM 03/23/2022    7:57 AM 12/20/2021    8:42 AM 07/11/2021    9:11 AM 05/19/2021    8:30 AM  Fall Risk   Falls in the past year? 0 0 0 0 0  Number falls in past yr: 0   0   Injury with Fall? 0   0   Risk for fall due to : No Fall Risks   Impaired balance/gait;Orthopedic patient;Medication side effect   Follow up Falls prevention discussed   Falls prevention discussed     FALL RISK PREVENTION PERTAINING TO THE HOME:  Any stairs in or around the home? No  If so, are there any without handrails? No  Home free of loose throw rugs in walkways, pet beds, electrical  cords, etc? Yes  Adequate lighting in your home to reduce risk of falls? Yes   ASSISTIVE DEVICES UTILIZED TO PREVENT FALLS:  Life alert? No  Use of a cane, walker or w/c? Yes  Grab bars in the bathroom? No  Shower chair or bench in shower? Yes  Elevated toilet seat or a handicapped toilet? Yes       07/12/2022    9:10 AM 07/11/2021    9:17 AM  6CIT Screen  What Year? 0 points 0 points  What month? 0 points 0 points  What time? 0 points 0 points  Count back from 20 0 points 0 points  Months in reverse 0 points 0 points  Repeat phrase 0 points 0 points  Total Score 0 points 0 points    Immunizations Immunization History  Administered Date(s) Administered   Influenza Inj Mdck Quad Pf 05/01/2018   Influenza Whole 05/02/2012   Influenza,inj,Quad PF,6+ Mos 04/28/2013,  05/18/2015, 05/06/2017, 04/21/2019, 04/24/2021, 04/23/2022   Influenza-Unspecified 04/27/2014, 05/14/2016   Janssen (J&J) SARS-COV-2 Vaccination 01/05/2020   MMR 09/29/1990   Moderna SARS-COV2 Booster Vaccination 07/31/2020   Moderna Sars-Covid-2 Vaccination 07/31/2020   Td 04/12/2010   Tdap 05/16/2020   Zoster Recombinat (Shingrix) 08/08/2019, 12/17/2019    TDAP status: Due, Education has been provided regarding the importance of this vaccine. Advised may receive this vaccine at local pharmacy or Health Dept. Aware to provide a copy of the vaccination record if obtained from local pharmacy or Health Dept. Verbalized acceptance and understanding.  Flu Vaccine status: Up to date  Pneumococcal vaccine status: Due, Education has been provided regarding the importance of this vaccine. Advised may receive this vaccine at local pharmacy or Health Dept. Aware to provide a copy of the vaccination record if obtained from local pharmacy or Health Dept. Verbalized acceptance and understanding.  Covid-19 vaccine status: Completed vaccines  Qualifies for Shingles Vaccine? Yes   Zostavax completed Yes   Shingrix  Completed?: Yes  Screening Tests Health Maintenance  Topic Date Due   COVID-19 Vaccine (3 - 2023-24 season) 04/06/2022   Medicare Annual Wellness (AWV)  07/13/2023   DTaP/Tdap/Td (3 - Td or Tdap) 05/16/2030   COLONOSCOPY (Pts 45-69yr Insurance coverage will need to be confirmed)  12/14/2030   INFLUENZA VACCINE  Completed   Hepatitis C Screening  Completed   HIV Screening  Completed   Zoster Vaccines- Shingrix  Completed   HPV VACCINES  Aged Out    Health Maintenance  Health Maintenance Due  Topic Date Due   COVID-19 Vaccine (3 - 2023-24 season) 04/06/2022    Colorectal cancer screening: Type of screening: Colonoscopy. Completed 12/13/2020. Repeat every 10 years  Lung Cancer Screening: (Low Dose CT Chest recommended if Age 61-80years, 30 pack-year currently smoking OR have quit w/in 15years.) does not qualify.   Lung Cancer Screening Referral: n/a  Additional Screening:  Hepatitis C Screening: does not qualify; Completed 08/07/2013  Vision Screening: Recommended annual ophthalmology exams for early detection of glaucoma and other disorders of the eye. Is the patient up to date with their annual eye exam?  Yes  Who is the provider or what is the name of the office in which the patient attends annual eye exams? Dr.Johnson  If pt is not established with a provider, would they like to be referred to a provider to establish care? No .   Dental Screening: Recommended annual dental exams for proper oral hygiene  Community Resource Referral / Chronic Care Management: CRR required this visit?  No   CCM required this visit?  No      Plan:     I have personally reviewed and noted the following in the patient's chart:   Medical and social history Use of alcohol, tobacco or illicit drugs  Current medications and supplements including opioid prescriptions. Patient is not currently taking opioid prescriptions. Functional ability and status Nutritional status Physical  activity Advanced directives List of other physicians Hospitalizations, surgeries, and ER visits in previous 12 months Vitals Screenings to include cognitive, depression, and falls Referrals and appointments  In addition, I have reviewed and discussed with patient certain preventive protocols, quality metrics, and best practice recommendations. A written personalized care plan for preventive services as well as general preventive health recommendations were provided to patient.     LDaphane Shepherd LPN   [id number]  Nurse Notes: Due pneumonia Vaccine

## 2022-07-17 ENCOUNTER — Other Ambulatory Visit: Payer: Self-pay | Admitting: Cardiology

## 2022-07-25 ENCOUNTER — Ambulatory Visit (INDEPENDENT_AMBULATORY_CARE_PROVIDER_SITE_OTHER): Payer: Medicare Other | Admitting: Family Medicine

## 2022-07-25 ENCOUNTER — Encounter: Payer: Self-pay | Admitting: Family Medicine

## 2022-07-25 VITALS — BP 118/66 | HR 66 | Temp 98.3°F | Ht 70.0 in | Wt 210.6 lb

## 2022-07-25 DIAGNOSIS — E039 Hypothyroidism, unspecified: Secondary | ICD-10-CM | POA: Diagnosis not present

## 2022-07-25 DIAGNOSIS — R10826 Epigastric rebound abdominal tenderness: Secondary | ICD-10-CM | POA: Diagnosis not present

## 2022-07-25 DIAGNOSIS — E538 Deficiency of other specified B group vitamins: Secondary | ICD-10-CM | POA: Diagnosis not present

## 2022-07-25 DIAGNOSIS — R918 Other nonspecific abnormal finding of lung field: Secondary | ICD-10-CM

## 2022-07-25 DIAGNOSIS — R6881 Early satiety: Secondary | ICD-10-CM

## 2022-07-25 NOTE — Patient Instructions (Signed)
Dr Ardis Hughs' with GI [facility] [place], [place] [address] [phone]

## 2022-07-26 ENCOUNTER — Ambulatory Visit: Payer: Medicare Other

## 2022-07-26 LAB — CMP14+EGFR
ALT: 35 IU/L (ref 0–44)
AST: 28 IU/L (ref 0–40)
Albumin/Globulin Ratio: 2.3 — ABNORMAL HIGH (ref 1.2–2.2)
Albumin: 4.4 g/dL (ref 3.9–4.9)
Alkaline Phosphatase: 119 IU/L (ref 44–121)
BUN/Creatinine Ratio: 12 (ref 10–24)
BUN: 12 mg/dL (ref 8–27)
Bilirubin Total: 0.7 mg/dL (ref 0.0–1.2)
CO2: 28 mmol/L (ref 20–29)
Calcium: 9.8 mg/dL (ref 8.6–10.2)
Chloride: 100 mmol/L (ref 96–106)
Creatinine, Ser: 1 mg/dL (ref 0.76–1.27)
Globulin, Total: 1.9 g/dL (ref 1.5–4.5)
Glucose: 99 mg/dL (ref 70–99)
Potassium: 3.8 mmol/L (ref 3.5–5.2)
Sodium: 142 mmol/L (ref 134–144)
Total Protein: 6.3 g/dL (ref 6.0–8.5)
eGFR: 86 mL/min/{1.73_m2} (ref >59)

## 2022-07-26 LAB — CBC
Hematocrit: 44.4 % (ref 37.5–51.0)
Hemoglobin: 15.3 g/dL (ref 13.0–17.7)
MCH: 30.9 pg (ref 26.6–33.0)
MCHC: 34.5 g/dL (ref 31.5–35.7)
MCV: 90 fL (ref 79–97)
Platelets: 242 10*3/uL (ref 150–450)
RBC: 4.95 x10E6/uL (ref 4.14–5.80)
RDW: 13.2 % (ref 11.6–15.4)
WBC: 6.7 10*3/uL (ref 3.4–10.8)

## 2022-07-26 LAB — LIPASE: Lipase: 26 U/L (ref 13–78)

## 2022-07-27 NOTE — Progress Notes (Signed)
Subjective: CC: Follow-up hypothyroidism PCP: Jeffrey Norlander, DO MBW:GYKZL Jeffrey Hooper is a 61 y.o. male presenting to clinic today for:  1.  Hypothyroidism/ ?hiatal hernia Patient is compliant with thyroid replacement.  No reports of tremor, heart palpitations, difficulty swallowing.  He does report some sensation of food building up in his abdomen and throat however and this sometimes will result in regurgitation, particularly at nighttime.  He worries about maybe trying to aspirate at nighttime because of this.  He just been generally feeling like he has abdominal fullness and wonders if this is related to the hernia.  He last had an EGD a couple of years ago with Dr. Ardis Hooper.  He reports normal bowel movements.  No actual pain but occasionally does have nausea without vomiting.  Has subsequently been using Zofran if needed for that nausea.   ROS: Per HPI  Allergies  Allergen Reactions   Alpha-Gal Rash and Other (See Comments)    SEVERE ALLERGY TO ANY MEAT OR MEAT DERIVED PRODUCTS FROM 4 LEGGED ANIMALS > > BEEF, PORK , GOATS, DEER, ETC. < < RESULT OF BITE FROM LONE STAR TICK   Beef (Bovine) Protein Rash and Other (See Comments)   Beef-Derived Products Rash and Other (See Comments)   Lambs Quarters Rash and Other (See Comments)   Pork-Derived Products Rash and Other (See Comments)   Prednisone Other (See Comments)    im depo medrol caused dizziness - pt passed out. Ask pt before giving. Pt can take oral prednisone    Brilinta [Ticagrelor] Hives    Pt ate pork on the same day he took Brilinta before knowing he had alpha gal. May have been the alpha gal reaction, pt is unsure.    Doxycycline Hives   Gabapentin Swelling   Lyrica [Pregabalin]     Dry mouth and felt raw   Methylprednisolone Sodium Succ     im depo medrol caused dizziness - pt passed out. Ask pt before giving. Issue occurred when administered in the left arm    Penicillins Hives    Did it involve swelling of the  face/tongue/throat, SOB, or low BP? No Did it involve sudden or severe rash/hives, skin peeling, or any reaction on the inside of your mouth or nose? No Did you need to seek medical attention at a hospital or doctor's office? No When did it last happen?      10 + years ago If all above answers are "NO", may proceed with cephalosporin use.    Shellfish Allergy Hives   Adhesive [Tape] Rash   Fentanyl Rash    Reaction to adhesive, not the drug    Past Medical History:  Diagnosis Date   Allergy    Allergy to alpha-gal    Anxiety    Arthritis    CAD (coronary artery disease), native coronary artery 12/03/2015   Coronary artery disease    Dysrhythmia    "irregular and PAC'S"   GERD (gastroesophageal reflux disease)    Head injury with loss of consciousness ([place])    back in the 1980's   History of kidney stones    Hyperlipidemia    Hypertension    Hypothyroid    Multiple benign nevi    Neuromuscular disorder (Jeffrey Hooper) 2011   LEFT RADIAL NERVE SURGERY R/T TRAUMA   Pneumonia 2014, 2017   Prostatism    Pulmonary nodule    Spinal cord stimulator status    told to bring remote to surgery    Current Outpatient  Medications:    acetaminophen (TYLENOL) 500 MG tablet, Take 1,000 mg by mouth daily as needed for mild pain or headache. , Disp: , Rfl:    amLODipine (NORVASC) 10 MG tablet, TAKE 1 TABLET BY MOUTH EVERY DAY, Disp: 90 tablet, Rfl: 3   aspirin EC 81 MG tablet, Take 81 mg by mouth at bedtime., Disp: , Rfl:    atorvastatin (LIPITOR) 80 MG tablet, TAKE 1 TABLET BY MOUTH EVERYDAY AT BEDTIME, Disp: 90 tablet, Rfl: 3   betamethasone dipropionate 0.05 % cream, Apply 1 Application topically 2 (two) times daily as needed (swelling/ itching)., Disp: , Rfl:    cetirizine (ZYRTEC) 10 MG tablet, Take 10 mg by mouth daily., Disp: , Rfl:    Cyanocobalamin (B-12 COMPLIANCE INJECTION) 1000 MCG/ML KIT, Inject 1,000 mcg as directed every 30 (thirty) days., Disp: , Rfl:    EPINEPHrine 0.3 mg/0.3 mL IJ  SOAJ injection, Inject 0.3 mg into the muscle as needed for anaphylaxis., Disp: 2 each, Rfl: 3   ferrous sulfate 325 (65 FE) MG EC tablet, Take 325 mg by mouth 2 (two) times daily., Disp: , Rfl:    fluticasone (FLONASE) 50 MCG/ACT nasal spray, Place 2 sprays into both nostrils daily., Disp: 48 g, Rfl: 1   hydrochlorothiazide (HYDRODIURIL) 50 MG tablet, Take 1 tablet (50 mg total) by mouth daily., Disp: 90 tablet, Rfl: 3   HYDROcodone-acetaminophen (NORCO) 7.5-325 MG tablet, Take 1 tablet by mouth every 6 (six) hours as needed for moderate pain., Disp: , Rfl:    levothyroxine (SYNTHROID) 100 MCG tablet, Take 1 tablet (100 mcg total) by mouth daily., Disp: 90 tablet, Rfl: 1   meloxicam (MOBIC) 7.5 MG tablet, Take 7.5 mg by mouth daily., Disp: , Rfl:    nitroGLYCERIN (NITROSTAT) 0.4 MG SL tablet, PLACE 1 TABLET UNDER THE TONGUE EVERY 5 MINUTES AS NEEDED FOR CHEST PAIN., Disp: 25 tablet, Rfl: 2   ondansetron (ZOFRAN-ODT) 4 MG disintegrating tablet, TAKE 1 TABLET BY MOUTH EVERY 8 HOURS AS NEEDED FOR NAUSEA AND VOMITING, Disp: 60 tablet, Rfl: 0   OVER THE COUNTER MEDICATION, Take 1 Dose by mouth daily. Cortexi otc liquid supplement, Disp: , Rfl:    pantoprazole (PROTONIX) 40 MG tablet, TAKE 1 TABLET BY MOUTH TWICE A DAY, Disp: 180 tablet, Rfl: 1   PARoxetine (PAXIL) 20 MG tablet, TAKE 1 TABLET BY MOUTH EVERY DAY, Disp: 90 tablet, Rfl: 0   promethazine (PHENERGAN) 25 MG tablet, TAKE 0.5-1 TABLETS (12.5-25 MG TOTAL) BY MOUTH 2 (TWO) TIMES DAILY AS NEEDED FOR NAUSEA OR VOMITING., Disp: 45 tablet, Rfl: 1   VENTOLIN HFA 108 (90 Base) MCG/ACT inhaler, TAKE 2 PUFFS BY MOUTH EVERY 6 HOURS AS NEEDED FOR WHEEZE OR SHORTNESS OF BREATH, Disp: 18 each, Rfl: 1  Current Facility-Administered Medications:    cyanocobalamin ((VITAMIN B-12)) injection 1,000 mcg, 1,000 mcg, Intramuscular, Q30 days, Jeffrey Hooper M, DO, 1,000 mcg at 07/25/22 1058 Social History   Socioeconomic History   Marital status: Married     Spouse name: Not on file   Number of children: 3   Years of education: Not on file   Highest education level: Not on file  Occupational History   Occupation: [employer]  Tobacco Use   Smoking status: Former    Packs/day: 1.00    Years: 20.00    Total pack years: 20.00    Types: Cigarettes    Quit date: 11/28/2006    Years since quitting: 15.6   Smokeless tobacco: Never  Vaping Use   Vaping Use: Never used  Substance and Sexual Activity   Alcohol use: Not Currently    Alcohol/week: 0.0 standard drinks of alcohol    Comment: rarely   Drug use: No   Sexual activity: Not Currently  Other Topics Concern   Not on file  Social History Narrative   Lives at home with wife, not working disabled from the city of [place]   They have an adopted son at home (age 26  2022)   Former smoker no alcohol tobacco or drug use now   Social Determinants of Radio broadcast assistant Strain: [place]  (07/12/2022)   Overall Financial Resource Strain (CARDIA)    Difficulty of Paying Living Expenses: Not hard at all  Food Insecurity: No Food Insecurity (07/12/2022)   Hunger Vital Sign    Worried About Running Out of Food in the Last Year: Never true    [place] in the Last Year: Never true  Transportation Needs: No Transportation Needs (07/12/2022)   PRAPARE - Hydrologist (Medical): No    Lack of Transportation (Non-Medical): No  Physical Activity: Insufficiently Active (07/12/2022)   Exercise Vital Sign    Days of Exercise per Week: 5 days    Minutes of Exercise per Session: 10 min  Stress: No Stress Concern Present (07/12/2022)   [place]    Feeling of Stress : Not at all  Social Connections: [place] (07/12/2022)   Social Connection and Isolation Panel [NHANES]    Frequency of Communication with Friends and Family: More than three times a week    Frequency of  Social Gatherings with Friends and Family: More than three times a week    Attends Religious Services: More than 4 times per year    Active Member of [employer] or Organizations: Yes    Attends Music therapist: More than 4 times per year    Marital Status: Married  Human resources officer Violence: Not At Risk (07/12/2022)   Humiliation, Afraid, Rape, and Kick questionnaire    Fear of Current or Ex-Partner: No    Emotionally Abused: No    Physically Abused: No    Sexually Abused: No   Family History  Problem Relation Age of Onset   Heart failure Mother 90       Died age 62   Congestive Heart Failure Mother    Heart attack Father 75       Died with MI   Heart attack Paternal Grandmother    Heart attack Paternal Grandfather    Colon cancer Neg Hx     Objective: Office vital signs reviewed. BP 118/66   Pulse 66   Temp 98.3 F (36.8 C)   Ht _0  (1.778 m)   Wt 210 lb 9.6 oz (95.5 kg)   SpO2 98%   BMI 30.22 kg/m   Physical Examination:  General: Awake, alert, well nourished, No acute distress HEENT: No exophthalmos.  No goiter.  Oropharynx is open.  No appreciable masses or obstruction Cardio: regular rate and rhythm, S1S2 heard, no murmurs appreciated Pulm: clear to auscultation bilaterally, no wheezes, rhonchi or rales; normal work of breathing on room air GI: soft, epigastric tenderness to palpation is present.  No rebound or guarding, non-distended, bowel sounds present x4, no hepatomegaly, no splenomegaly, no masses MSK: Ambulates with cane Neuro: No tremor  Assessment/ Plan: 61 y.o. male   Early  satiety - Plan: Ambulatory referral to Gastroenterology  Epigastric abdominal tenderness with rebound tenderness - Plan: Ambulatory referral to Gastroenterology, CMP14+EGFR, CBC, Lipase  Multiple lung nodules on CT - Plan: CT Chest Wo Contrast  Acquired hypothyroidism  Referral back to his gastroenterologist for early satiety.  I do question hiatal hernia given  reports of regurgitation and nausea.  He is treated with a PPI and has been subsequently eating small meals because of the symptoms.  Hopefully Dr. Ardis Hooper can further evaluate.  We discussed red flags and symptoms warranting further evaluation prior to scheduled visit with GI.  In the interim we will check CMP, CBC and lipase to ensure that there is not a pancreatitis or other type of infectious process  We reordered his CT to follow-up on multiple lung nodules noted in 2021.  Not due for thyroid levels  Orders Placed This Encounter  Procedures   CT Chest Wo Contrast    Standing Status:   Future    Standing Expiration Date:   07/26/2023    Order Specific Question:   Preferred imaging location?    Answer:   [facility]   CMP14+EGFR   CBC   Lipase   Ambulatory referral to Gastroenterology    Referral Priority:   Routine    Referral Type:   Consultation    Referral Reason:   Specialty Services Required    Number of Visits Requested:   1   No orders of the defined types were placed in this encounter.    Jeffrey Norlander, DO [place] [phone]

## 2022-08-09 ENCOUNTER — Other Ambulatory Visit: Payer: Self-pay | Admitting: Family Medicine

## 2022-08-09 DIAGNOSIS — F411 Generalized anxiety disorder: Secondary | ICD-10-CM

## 2022-08-15 ENCOUNTER — Ambulatory Visit (INDEPENDENT_AMBULATORY_CARE_PROVIDER_SITE_OTHER): Payer: Medicare Other

## 2022-08-15 ENCOUNTER — Ambulatory Visit (INDEPENDENT_AMBULATORY_CARE_PROVIDER_SITE_OTHER): Payer: Medicare Other | Admitting: Family Medicine

## 2022-08-15 ENCOUNTER — Encounter: Payer: Self-pay | Admitting: Family Medicine

## 2022-08-15 VITALS — BP 135/74 | HR 72 | Temp 98.7°F | Ht 70.0 in | Wt 208.6 lb

## 2022-08-15 DIAGNOSIS — J069 Acute upper respiratory infection, unspecified: Secondary | ICD-10-CM

## 2022-08-15 DIAGNOSIS — R058 Other specified cough: Secondary | ICD-10-CM

## 2022-08-15 LAB — VERITOR FLU A/B WAIVED
Influenza A: NEGATIVE
Influenza B: NEGATIVE

## 2022-08-15 LAB — RSV AG, IMMUNOCHR, WAIVED: RSV Ag, Immunochr, Waived: NEGATIVE

## 2022-08-15 MED ORDER — BENZONATATE 100 MG PO CAPS: 100.0000 mg | ORAL_CAPSULE | Freq: Three times a day (TID) | ORAL | 0 refills | Status: DC | PRN

## 2022-08-15 MED ORDER — PROMETHAZINE-DM 6.25-15 MG/5ML PO SYRP: 2.5000 mL | ORAL_SOLUTION | Freq: Four times a day (QID) | ORAL | 0 refills | Status: DC | PRN

## 2022-08-15 MED ORDER — AZITHROMYCIN 250 MG PO TABS: ORAL_TABLET | ORAL | 0 refills | Status: AC

## 2022-08-15 NOTE — Progress Notes (Signed)
Subjective: CC: URI PCP: Jeffrey Norlander, DO Jeffrey Hooper is a 62 y.o. male presenting to clinic today for:  1.  URI Patient reports a 4-day history of cough, chills, congestion.  Cough is productive with brown sputum.  He has been having some maxillary pain, myalgias.  His wife is sick with similar.  She tested negative for all the viruses.  He reports some diarrhea but no nausea or vomiting.  Had a fever to 101.1 F this morning.  He has been utilizing DayQuil, NyQuil, Tylenol and hot Toddy's.  He also has been using some albuterol but denies any wheezes.   ROS: Per HPI  Allergies  Allergen Reactions   Alpha-Gal Rash and Other (See Comments)    SEVERE ALLERGY TO ANY MEAT OR MEAT DERIVED PRODUCTS FROM 4 LEGGED ANIMALS > > BEEF, PORK , GOATS, DEER, ETC. < < RESULT OF BITE FROM LONE STAR TICK   Beef (Bovine) Protein Rash and Other (See Comments)   Beef-Derived Products Rash and Other (See Comments)   Lambs Quarters Rash and Other (See Comments)   Pork-Derived Products Rash and Other (See Comments)   Prednisone Other (See Comments)    im depo medrol caused dizziness - pt passed out. Ask pt before giving. Pt can take oral prednisone    Brilinta [Ticagrelor] Hives    Pt ate pork on the same day he took Brilinta before knowing he had alpha gal. May have been the alpha gal reaction, pt is unsure.    Doxycycline Hives   Gabapentin Swelling   Lyrica [Pregabalin]     Dry mouth and felt raw   Methylprednisolone Sodium Succ     im depo medrol caused dizziness - pt passed out. Ask pt before giving. Issue occurred when administered in the left arm    Penicillins Hives    Did it involve swelling of the face/tongue/throat, SOB, or low BP? No Did it involve sudden or severe rash/hives, skin peeling, or any reaction on the inside of your mouth or nose? No Did you need to seek medical attention at a hospital or doctor's office? No When did it last happen?      10 + years ago If all  above answers are "NO", may proceed with cephalosporin use.    Shellfish Allergy Hives   Adhesive [Tape] Rash   Fentanyl Rash    Reaction to adhesive, not the drug    Past Medical History:  Diagnosis Date   Allergy    Allergy to alpha-gal    Anxiety    Arthritis    CAD (coronary artery disease), native coronary artery 12/03/2015   Coronary artery disease    Dysrhythmia    "irregular and PAC'S"   GERD (gastroesophageal reflux disease)    Head injury with loss of consciousness ([place])    back in the 1980's   History of kidney stones    Hyperlipidemia    Hypertension    Hypothyroid    Multiple benign nevi    Neuromuscular disorder ([place]) 2011   LEFT RADIAL NERVE SURGERY R/T TRAUMA   Pneumonia 2014, 2017   Prostatism    Pulmonary nodule    Spinal cord stimulator status    told to bring remote to surgery    Current Outpatient Medications:    acetaminophen (TYLENOL) 500 MG tablet, Take 1,000 mg by mouth daily as needed for mild pain or headache. , Disp: , Rfl:    amLODipine (NORVASC) 10 MG tablet, TAKE 1 TABLET  BY MOUTH EVERY DAY, Disp: 90 tablet, Rfl: 3   aspirin EC 81 MG tablet, Take 81 mg by mouth at bedtime., Disp: , Rfl:    atorvastatin (LIPITOR) 80 MG tablet, TAKE 1 TABLET BY MOUTH EVERYDAY AT BEDTIME, Disp: 90 tablet, Rfl: 3   betamethasone dipropionate 0.05 % cream, Apply 1 Application topically 2 (two) times daily as needed (swelling/ itching)., Disp: , Rfl:    cetirizine (ZYRTEC) 10 MG tablet, Take 10 mg by mouth daily., Disp: , Rfl:    Cyanocobalamin (B-12 COMPLIANCE INJECTION) 1000 MCG/ML KIT, Inject 1,000 mcg as directed every 30 (thirty) days., Disp: , Rfl:    EPINEPHrine 0.3 mg/0.3 mL IJ SOAJ injection, Inject 0.3 mg into the muscle as needed for anaphylaxis., Disp: 2 each, Rfl: 3   ferrous sulfate 325 (65 FE) MG EC tablet, Take 325 mg by mouth 2 (two) times daily., Disp: , Rfl:    fluticasone (FLONASE) 50 MCG/ACT nasal spray, Place 2 sprays into both nostrils daily.,  Disp: 48 g, Rfl: 1   hydrochlorothiazide (HYDRODIURIL) 50 MG tablet, Take 1 tablet (50 mg total) by mouth daily., Disp: 90 tablet, Rfl: 3   HYDROcodone-acetaminophen (NORCO) 7.5-325 MG tablet, Take 1 tablet by mouth every 6 (six) hours as needed for moderate pain., Disp: , Rfl:    levothyroxine (SYNTHROID) 100 MCG tablet, Take 1 tablet (100 mcg total) by mouth daily., Disp: 90 tablet, Rfl: 1   meloxicam (MOBIC) 7.5 MG tablet, Take 7.5 mg by mouth daily., Disp: , Rfl:    nitroGLYCERIN (NITROSTAT) 0.4 MG SL tablet, PLACE 1 TABLET UNDER THE TONGUE EVERY 5 MINUTES AS NEEDED FOR CHEST PAIN., Disp: 25 tablet, Rfl: 2   ondansetron (ZOFRAN-ODT) 4 MG disintegrating tablet, TAKE 1 TABLET BY MOUTH EVERY 8 HOURS AS NEEDED FOR NAUSEA AND VOMITING, Disp: 60 tablet, Rfl: 0   OVER THE COUNTER MEDICATION, Take 1 Dose by mouth daily. Cortexi otc liquid supplement, Disp: , Rfl:    pantoprazole (PROTONIX) 40 MG tablet, TAKE 1 TABLET BY MOUTH TWICE A DAY, Disp: 180 tablet, Rfl: 1   PARoxetine (PAXIL) 20 MG tablet, TAKE 1 TABLET BY MOUTH EVERY DAY, Disp: 90 tablet, Rfl: 3   promethazine (PHENERGAN) 25 MG tablet, TAKE 0.5-1 TABLETS (12.5-25 MG TOTAL) BY MOUTH 2 (TWO) TIMES DAILY AS NEEDED FOR NAUSEA OR VOMITING., Disp: 45 tablet, Rfl: 1   VENTOLIN HFA 108 (90 Base) MCG/ACT inhaler, TAKE 2 PUFFS BY MOUTH EVERY 6 HOURS AS NEEDED FOR WHEEZE OR SHORTNESS OF BREATH, Disp: 18 each, Rfl: 1  Current Facility-Administered Medications:    cyanocobalamin ((VITAMIN B-12)) injection 1,000 mcg, 1,000 mcg, Intramuscular, Q30 days, Jeffrey Hooper M, DO, 1,000 mcg at 07/25/22 1058 Social History   Socioeconomic History   Marital status: Married    Spouse name: Not on file   Number of children: 3   Years of education: Not on file   Highest education level: Not on file  Occupational History   Occupation: [employer]  Tobacco Use   Smoking status: Former    Packs/day: 1.00    Years: 20.00    Total pack years:  20.00    Types: Cigarettes    Quit date: 11/28/2006    Years since quitting: 15.7   Smokeless tobacco: Never  Vaping Use   Vaping Use: Never used  Substance and Sexual Activity   Alcohol use: Not Currently    Alcohol/week: 0.0 standard drinks of alcohol    Comment: rarely   Drug  use: No   Sexual activity: Not Currently  Other Topics Concern   Not on file  Social History Narrative   Lives at home with wife, not working disabled from the city of [place]   They have an adopted son at home (age 84  2022)   Former smoker no alcohol tobacco or drug use now   Social Determinants of Radio broadcast assistant Strain: [place]  (07/12/2022)   Overall Financial Resource Strain (CARDIA)    Difficulty of Paying Living Expenses: Not hard at all  Food Insecurity: No Food Insecurity (07/12/2022)   Hunger Vital Sign    Worried About Running Out of Food in the Last Year: Never true    [place] in the Last Year: Never true  Transportation Needs: No Transportation Needs (07/12/2022)   PRAPARE - Hydrologist (Medical): No    Lack of Transportation (Non-Medical): No  Physical Activity: Insufficiently Active (07/12/2022)   Exercise Vital Sign    Days of Exercise per Week: 5 days    Minutes of Exercise per Session: 10 min  Stress: No Stress Concern Present (07/12/2022)   [place]    Feeling of Stress : Not at all  Social Connections: [place] (07/12/2022)   Social Connection and Isolation Panel [NHANES]    Frequency of Communication with Friends and Family: More than three times a week    Frequency of Social Gatherings with Friends and Family: More than three times a week    Attends Religious Services: More than 4 times per year    Active Member of [employer] or Organizations: Yes    Attends Music therapist: More than 4 times per year    Marital Status: Married  Arboriculturist Violence: Not At Risk (07/12/2022)   Humiliation, Afraid, Rape, and Kick questionnaire    Fear of Current or Ex-Partner: No    Emotionally Abused: No    Physically Abused: No    Sexually Abused: No   Family History  Problem Relation Age of Onset   Heart failure Mother 95       Died age 45   Congestive Heart Failure Mother    Heart attack Father 17       Died with MI   Heart attack Paternal Grandmother    Heart attack Paternal Grandfather    Colon cancer Neg Hx     Objective: Office vital signs reviewed. BP 135/74   Pulse 72   Temp 98.7 F (37.1 C) (Temporal)   Ht '5\' 10"'$  (1.778 m)   Wt 208 lb 9.6 oz (94.6 kg)   SpO2 96%   BMI 29.93 kg/m   Physical Examination:  General: Awake, alert, tired appearing, No acute distress HEENT: Normal    Neck: No masses palpated. No lymphadenopathy    Ears: Tympanic membranes intact, normal light reflex, no erythema, no bulging    Eyes: PERRLA, extraocular membranes intact, sclera white    Nose: nasal turbinates moist, clear nasal discharge    Throat: moist mucus membranes, no erythema, no tonsillar exudate.  Airway is patent Cardio: regular rate and rhythm, S1S2 heard, no murmurs appreciated Pulm: Has some mild crackles noted at the left lower lung.  Coughing quite profusely during exam.  No wheezes.  He has normal work of breathing on room air.   DG Chest 2 View  Result Date: 08/15/2022 CLINICAL DATA:  Cough and congestion. Decreased  left lower lobe. Rule out pneumonia. EXAM: CHEST - 2 VIEW COMPARISON:  Radiographs 07/05/2017 and 07/26/2016. FINDINGS: The heart size and mediastinal contours are stable. The lungs are clear. There is no pleural effusion or pneumothorax. No acute osseous findings are identified. Postsurgical findings from cervicothoracic fusion and thoracic spinal stimulator placement. Postsurgical changes are also partially imaged in the upper lumbar spine. There is an old right-sided rib fracture which appears  unchanged. IMPRESSION: Stable chest. No active cardiopulmonary process. Electronically Signed   By: Richardean Sale M.D.   On: 08/15/2022 16:12     Assessment/ Plan: 62 y.o. male   URI with cough and congestion - Plan: Novel Coronavirus, NAA ([facility]), Veritor Flu A/B Waived, RSV Ag, EIA, RSV Ag, Immunochr, Waived, DG Chest 2 View, azithromycin (ZITHROMAX) 250 MG tablet, benzonatate (TESSALON PERLES) 100 MG capsule, promethazine-dextromethorphan (PROMETHAZINE-DM) 6.25-15 MG/5ML syrup  Productive cough - Plan: DG Chest 2 View, azithromycin (ZITHROMAX) 250 MG tablet, benzonatate (TESSALON PERLES) 100 MG capsule, promethazine-dextromethorphan (PROMETHAZINE-DM) 6.25-15 MG/5ML syrup  I am going to empirically treat him for community-acquired pneumonia given left lower lung findings on exam and brown sputum.  Z-Pak sent.  Cough medication sent.  Chest x-ray did not demonstrate any focal findings in the area of concern.  Home care instructions reviewed and reasons for reevaluation discussed.  He will follow-up as needed  Orders Placed This Encounter  Procedures   Novel Coronavirus, NAA ([facility])    Order Specific Question:   Previously tested for COVID-19    Answer:   Yes    Order Specific Question:   Resident in a congregate (group) care setting    Answer:   Unknown    Order Specific Question:   Is the patient student?    Answer:   No    Order Specific Question:   Employed in healthcare setting    Answer:   No    Order Specific Question:   Has patient completed COVID vaccination(s) (2 doses of Pfizer/Moderna 1 dose of [employer])    Answer:   Unknown   RSV Ag, EIA   Veritor Flu A/B Waived    Order Specific Question:   Source    Answer:   nasal   No orders of the defined types were placed in this encounter.    Jeffrey Norlander, DO [place] [phone]

## 2022-08-15 NOTE — Patient Instructions (Signed)
  Acute Bronchitis, Adult  Acute bronchitis is when air tubes in the lungs (bronchi) suddenly get swollen. The condition can make it hard for you to breathe. In adults, acute bronchitis usually goes away within 2 weeks. A cough caused by bronchitis may last up to 3 weeks. Smoking, allergies, and asthma can make the condition worse. What are the causes? Germs that cause cold and flu (viruses). The most common cause of this condition is the virus that causes the common cold. Bacteria. Substances that bother (irritate) the lungs, including: Smoke from cigarettes and other types of tobacco. Dust and pollen. Fumes from chemicals, gases, or burned fuel. Indoor or outdoor air pollution. What increases the risk? A weak body's defense system. This is also called the immune system. Any condition that affects your lungs and breathing, such as asthma. What are the signs or symptoms? A cough. Coughing up clear, yellow, or green mucus. Making high-pitched whistling sounds when you breathe, most often when you breathe out (wheezing). Runny or stuffy nose. Having too much mucus in your lungs (chest congestion). Shortness of breath. Body aches. A sore throat. How is this treated? Acute bronchitis may go away over time without treatment. Your doctor may tell you to: Drink more fluids. This will help thin your mucus so it is easier to cough up. Use a device that gets medicine into your lungs (inhaler). Use a vaporizer or a humidifier. These are machines that add water to the air. This helps with coughing and poor breathing. Take a medicine that thins mucus and helps clear it from your lungs. Take a medicine that prevents or stops coughing. It is not common to take an antibiotic medicine for this condition. Follow these instructions at home:  Take over-the-counter and prescription medicines only as told by your doctor. Use an inhaler, vaporizer, or humidifier as told by your doctor. Take two  teaspoons (10 mL) of honey at bedtime. This helps lessen your coughing at night. Drink enough fluid to keep your pee (urine) pale yellow. Do not smoke or use any products that contain nicotine or tobacco. If you need help quitting, ask your doctor. Get a lot of rest. Return to your normal activities when your doctor says that it is safe. Keep all follow-up visits. How is this prevented?  Wash your hands often with soap and water for at least 20 seconds. If you cannot use soap and water, use hand sanitizer. Avoid contact with people who have cold symptoms. Try not to touch your mouth, nose, or eyes with your hands. Avoid breathing in smoke or chemical fumes. Make sure to get the flu shot every year. Contact a doctor if: Your symptoms do not get better in 2 weeks. You have trouble coughing up the mucus. Your cough keeps you awake at night. You have a fever. Get help right away if: You cough up blood. You have chest pain. You have very bad shortness of breath. You faint or keep feeling like you are going to faint. You have a very bad headache. Your fever or chills get worse. These symptoms may be an emergency. Get help right away. Call your local emergency services (911 in the [place] Do not wait to see if the symptoms will go away. Do not drive yourself to the hospital. Summary Acute bronchitis is when air tubes in the lungs (bronchi) suddenly get swollen. In adults, acute bronchitis usually goes away within 2 weeks. Drink more fluids. This will help thin your mucus so it   is easier to cough up. Take over-the-counter and prescription medicines only as told by your doctor. Contact a doctor if your symptoms do not improve after 2 weeks of treatment. This information is not intended to replace advice given to you by your health care provider. Make sure you discuss any questions you have with your health care provider. Document Revised: 11/23/2020 Document Reviewed: 11/23/2020 Elsevier  Patient Education  2023 [employer] Inc[employer]  

## 2022-08-16 LAB — NOVEL CORONAVIRUS, NAA: SARS-CoV-2, NAA: NOT DETECTED

## 2022-08-22 ENCOUNTER — Ambulatory Visit (INDEPENDENT_AMBULATORY_CARE_PROVIDER_SITE_OTHER): Payer: Medicare Other | Admitting: Gastroenterology

## 2022-08-22 ENCOUNTER — Ambulatory Visit: Payer: Medicare Other | Admitting: Gastroenterology

## 2022-08-22 ENCOUNTER — Encounter: Payer: Self-pay | Admitting: Gastroenterology

## 2022-08-22 VITALS — BP 136/68 | HR 68 | Ht 70.0 in | Wt 207.0 lb

## 2022-08-22 DIAGNOSIS — R1012 Left upper quadrant pain: Secondary | ICD-10-CM

## 2022-08-22 DIAGNOSIS — R110 Nausea: Secondary | ICD-10-CM | POA: Diagnosis not present

## 2022-08-22 DIAGNOSIS — K219 Gastro-esophageal reflux disease without esophagitis: Secondary | ICD-10-CM

## 2022-08-22 MED ORDER — SUCRALFATE 1 G PO TABS: 1.0000 g | ORAL_TABLET | Freq: Three times a day (TID) | ORAL | 1 refills | Status: DC

## 2022-08-22 NOTE — Patient Instructions (Signed)
If you are age 62 or younger, your body mass index should be between 19-25. Your Body mass index is 29.7 kg/m. If this is out of the aformentioned range listed, please consider follow up with your Primary Care Provider.  ________________________________________________________  The  GI providers would like to encourage you to use [facility] to communicate with providers for non-urgent requests or questions.  Due to long hold times on the telephone, sending your provider a message by [facility] may be a faster and more efficient way to get a response.  Please allow 48 business hours for a response.  Please remember that this is for non-urgent requests.   We have sent the following medications to your pharmacy for you to pick up at your convenience:  START: Carafate 1g one tablet before each meal and at bedtime.  You have been scheduled for a gastric emptying scan at [facility] on 09-07-22 at 7:30am. Please arrive at least 30 minutes prior to your appointment for registration. Please make certain not to have anything to eat or drink after midnight the night before your test. Hold all stomach medications (ex: Zofran, phenergan, Reglan) 24 hours prior to your test. If you need to reschedule your appointment, please contact radiology scheduling at [phone]. _____________________________________________________________________ A gastric-emptying study measures how long it takes for food to move through your stomach. There are several ways to measure stomach emptying. In the most common test, you eat food that contains a small amount of radioactive material. A scanner that detects the movement of the radioactive material is placed over your abdomen to monitor the rate at which food leaves your stomach. This test normally takes about 4 hours to complete. _____________________________________________________________________  Due to recent changes in healthcare laws, you may see the results of  your imaging and laboratory studies on MyChart before your provider has had a chance to review them.  We understand that in some cases there may be results that are confusing or concerning to you. Not all laboratory results come back in the same time frame and the provider may be waiting for multiple results in order to interpret others.  Please give [place] 48 hours in order for your provider to thoroughly review all the results before contacting the office for clarification of your results.   Thank you for entrusting me with your care and choosing [facility].  Alonza Bogus, PA-C

## 2022-08-22 NOTE — Progress Notes (Signed)
08/22/2022 Jeffrey Hooper [id number] [birth year]   HISTORY OF PRESENT ILLNESS: This is a 62 year old male who is a patient of Dr. Ardis Hughs.  He presents here today with complaints of constant pain in his left upper quadrant for the past 8 months as well as nausea.  He describes the discomfort as being a constant ache.  He says it feels bloated.  He does not vomit, but does have some material reflux up at times.  Says that he does not eat much at a time and he feels full.  He is not diabetic.  He is on pantoprazole 40 mg twice daily.  He does use hydrocodone for his back pain usually daily.  He is asking about the small hiatal hernia that was noted on his back CT scan last year.  He had a CT scan in 06/2020 that showed a short segment jejunal intussusception in the left upper quadrant.  He saw surgery and underwent a diagnostic laparoscopy and repair of small umbilical hernia.  There was no intussusception seen at that time.  EGD in May 2022 showed mild to moderate gastritis.  Biopsies showed mild chronic gastritis negative for H. pylori.  Past Medical History:  Diagnosis Date   Allergy    Allergy to alpha-gal    Anxiety    Arthritis    CAD (coronary artery disease), native coronary artery 12/03/2015   Coronary artery disease    Dysrhythmia    "irregular and PAC'S"   GERD (gastroesophageal reflux disease)    Head injury with loss of consciousness ([place])    back in the 1980's   History of kidney stones    Hyperlipidemia    Hypertension    Hypothyroid    Multiple benign nevi    Neuromuscular disorder ([place]) 2011   LEFT RADIAL NERVE SURGERY R/T TRAUMA   Pneumonia 2014, 2017   Prostatism    Pulmonary nodule    Spinal cord stimulator status    told to bring remote to surgery   Past Surgical History:  Procedure Laterality Date   BACK SURGERY     T12 - L1, 11 times including neck   CARDIAC CATHETERIZATION N/A 12/02/2015   Procedure: Left Heart Cath and Coronary Angiography;   Surgeon: Peter M Martinique, MD;  Location: [facility];  Service: Cardiovascular;  Laterality: N/A;   CARDIAC CATHETERIZATION N/A 12/02/2015   Procedure: Coronary Stent Intervention;  Surgeon: Peter M Martinique, MD;  Location: [place] [facility];  Service: Cardiovascular;  Laterality: N/A;  mid LAD Promus 2.5x12   CERVICAL FUSION  2023   C4 and C5   COLONOSCOPY     CORONARY ANGIOPLASTY     one stent placed by Dr. P Martinique   HARDWARE REMOVAL Right 08/26/2017   Procedure: Right Lumbar Five Revision of pedicle screw with Removal of Lumbar Five Screw;  Surgeon: Kristeen Miss, MD;  Location: [place];  Service: Neurosurgery;  Laterality: Right;  posterior   HARDWARE REMOVAL Right 12/27/2017   Procedure: Right Lumbar Two, Lumbar Three, Lumbar Four Pedicle screw removal with metrex;  Surgeon: Kristeen Miss, MD;  Location: [place];  Service: Neurosurgery;  Laterality: Right;  Right L2 to L4 Pedicle screw removal with mterex   HEMORRHOID BANDING     LAPAROSCOPY N/A 09/01/2020   Procedure: LAPAROSCOPY DIAGNOSTIC;  Surgeon: Leighton Ruff, MD;  Location: [facility];  Service: General;  Laterality: N/A;   MASS EXCISION  06/25/2012   Procedure: EXCISION MASS;  Surgeon: Dillard Essex  Fredna Dow, MD;  Location: [place];  Service: Orthopedics;  Laterality: Left;  transection of NEUROMA, BURYING RADIAL NERVE IN BRACHIORADIALIS LEFT SIDE   radial nerve Left    cut in work injury   RIGHT GREAT TOENAIL REMOVAL  04/2020   SPINAL FUSION     C6-7   TONSILLECTOMY  5361   UMBILICAL HERNIA REPAIR N/A 09/01/2020   Procedure: PRIMARY UMBILICAL HERNIA REPAIR;  Surgeon: Leighton Ruff, MD;  Location: [facility];  Service: General;  Laterality: N/A;   VASECTOMY      reports that he quit smoking about 15 years ago. His smoking use included cigarettes. He has a 20.00 pack-year smoking history. He has never used smokeless tobacco. He reports that he does not currently use alcohol. He reports that he does not use drugs. family  history includes Congestive Heart Failure in his mother; Heart attack in his paternal grandfather and paternal grandmother; Heart attack (age of onset: 80) in his father; Heart failure (age of onset: 47) in his mother. Allergies  Allergen Reactions   Alpha-Gal Rash and Other (See Comments)    SEVERE ALLERGY TO ANY MEAT OR MEAT DERIVED PRODUCTS FROM 4 LEGGED ANIMALS > > BEEF, PORK , GOATS, DEER, ETC. < < RESULT OF BITE FROM LONE STAR TICK   Beef (Bovine) Protein Rash and Other (See Comments)   Beef-Derived Products Rash and Other (See Comments)   Lambs Quarters Rash and Other (See Comments)   Pork-Derived Products Rash and Other (See Comments)   Prednisone Other (See Comments)    im depo medrol caused dizziness - pt passed out. Ask pt before giving. Pt can take oral prednisone    Brilinta [Ticagrelor] Hives    Pt ate pork on the same day he took Brilinta before knowing he had alpha gal. May have been the alpha gal reaction, pt is unsure.    Doxycycline Hives   Gabapentin Swelling   Lyrica [Pregabalin]     Dry mouth and felt raw   Methylprednisolone Sodium Succ     im depo medrol caused dizziness - pt passed out. Ask pt before giving. Issue occurred when administered in the left arm    Penicillins Hives    Did it involve swelling of the face/tongue/throat, SOB, or low BP? No Did it involve sudden or severe rash/hives, skin peeling, or any reaction on the inside of your mouth or nose? No Did you need to seek medical attention at a hospital or doctor's office? No When did it last happen?      10 + years ago If all above answers are "NO", may proceed with cephalosporin use.    Shellfish Allergy Hives   Adhesive [Tape] Rash   Fentanyl Rash    Reaction to adhesive, not the drug       Outpatient Encounter Medications as of 08/22/2022  Medication Sig   acetaminophen (TYLENOL) 500 MG tablet Take 1,000 mg by mouth daily as needed for mild pain or headache.    amLODipine (NORVASC) 10 MG  tablet TAKE 1 TABLET BY MOUTH EVERY DAY   aspirin EC 81 MG tablet Take 81 mg by mouth at bedtime.   atorvastatin (LIPITOR) 80 MG tablet TAKE 1 TABLET BY MOUTH EVERYDAY AT BEDTIME   benzonatate (TESSALON PERLES) 100 MG capsule Take 1 capsule (100 mg total) by mouth 3 (three) times daily as needed.   betamethasone dipropionate 0.05 % cream Apply 1 Application topically 2 (two) times daily as needed (swelling/ itching).  cetirizine (ZYRTEC) 10 MG tablet Take 10 mg by mouth daily.   Cyanocobalamin (B-12 COMPLIANCE INJECTION) 1000 MCG/ML KIT Inject 1,000 mcg as directed every 30 (thirty) days.   EPINEPHrine 0.3 mg/0.3 mL IJ SOAJ injection Inject 0.3 mg into the muscle as needed for anaphylaxis.   ferrous sulfate 325 (65 FE) MG EC tablet Take 325 mg by mouth 2 (two) times daily.   fluticasone (FLONASE) 50 MCG/ACT nasal spray Place 2 sprays into both nostrils daily.   hydrochlorothiazide (HYDRODIURIL) 50 MG tablet Take 1 tablet (50 mg total) by mouth daily.   HYDROcodone-acetaminophen (NORCO) 7.5-325 MG tablet Take 1 tablet by mouth every 6 (six) hours as needed for moderate pain.   levothyroxine (SYNTHROID) 100 MCG tablet Take 1 tablet (100 mcg total) by mouth daily.   meloxicam (MOBIC) 7.5 MG tablet Take 7.5 mg by mouth daily.   nitroGLYCERIN (NITROSTAT) 0.4 MG SL tablet PLACE 1 TABLET UNDER THE TONGUE EVERY 5 MINUTES AS NEEDED FOR CHEST PAIN.   ondansetron (ZOFRAN-ODT) 4 MG disintegrating tablet TAKE 1 TABLET BY MOUTH EVERY 8 HOURS AS NEEDED FOR NAUSEA AND VOMITING   OVER THE COUNTER MEDICATION Take 1 Dose by mouth daily. Cortexi otc liquid supplement   pantoprazole (PROTONIX) 40 MG tablet TAKE 1 TABLET BY MOUTH TWICE A DAY   PARoxetine (PAXIL) 20 MG tablet TAKE 1 TABLET BY MOUTH EVERY DAY   promethazine (PHENERGAN) 25 MG tablet TAKE 0.5-1 TABLETS (12.5-25 MG TOTAL) BY MOUTH 2 (TWO) TIMES DAILY AS NEEDED FOR NAUSEA OR VOMITING.   promethazine-dextromethorphan (PROMETHAZINE-DM) 6.25-15 MG/5ML syrup  Take 2.5 mLs by mouth 4 (four) times daily as needed for cough.   VENTOLIN HFA 108 (90 Base) MCG/ACT inhaler TAKE 2 PUFFS BY MOUTH EVERY 6 HOURS AS NEEDED FOR WHEEZE OR SHORTNESS OF BREATH   Facility-Administered Encounter Medications as of 08/22/2022  Medication   cyanocobalamin ((VITAMIN B-12)) injection 1,000 mcg     REVIEW OF SYSTEMS  : All other systems reviewed and negative except where noted in the History of Present Illness.   PHYSICAL EXAM: BP 136/68   Pulse 68   Ht '5\' 10"'$  (1.778 m)   Wt 207 lb (93.9 kg)   SpO2 97%   BMI 29.70 kg/m  General: Well developed white male in no acute distress Head: Normocephalic and atraumatic Eyes:  Sclerae anicteric, conjunctiva pink. Ears: Normal auditory acuity Lungs: Clear throughout to auscultation; no W/R/R. Heart: Regular rate and rhythm; no M/R/G. Abdomen: Soft, non-distended.  BS present.  Non-tender. Musculoskeletal: Symmetrical with no gross deformities  Skin: No lesions on visible extremities Extremities: No edema  Neurological: Alert oriented x 4, grossly non-focal Psychological:  Alert and cooperative. Normal mood and affect  ASSESSMENT AND PLAN: *62 year old male with complaints of constant left upper quadrant abdominal achey discomfort with nausea and bloating sensation.  Also reports early satiety.  He is not diabetic.  He does use hydrocodone usually daily for his back pain.  Will plan for gastric emptying scan.  He is on pantoprazole 40 mg twice daily.  Can add Carafate 1 tablet ACHS.  New prescription sent to pharmacy.  Interestingly as I was reviewing his records further, in 2021 he had a CT scan that showed a short segment jejunal intussusception in the left upper quadrant.  He saw surgery and underwent a diagnostic laparoscopy and repair of small umbilical hernia.  There was no intussusception seen at that time.  May need to consider CT enterography pending results of the above.   CC:  Ronnie Doss M, DO

## 2022-08-24 NOTE — Progress Notes (Signed)
Addendum: Reviewed and agree with assessment and management plan. Keily Lepp M, MD  

## 2022-08-27 ENCOUNTER — Ambulatory Visit (INDEPENDENT_AMBULATORY_CARE_PROVIDER_SITE_OTHER): Payer: Medicare Other | Admitting: *Deleted

## 2022-08-27 DIAGNOSIS — E538 Deficiency of other specified B group vitamins: Secondary | ICD-10-CM | POA: Diagnosis not present

## 2022-08-27 NOTE — Progress Notes (Signed)
Vitamin b12 injection given and patient tolerated well.  

## 2022-09-07 ENCOUNTER — Encounter (HOSPITAL_COMMUNITY)
Admission: RE | Admit: 2022-09-07 | Discharge: 2022-09-07 | Disposition: A | Discharge status: Home / Self Care | Payer: Medicare Other | Source: Ambulatory Visit | Attending: Gastroenterology | Admitting: Gastroenterology

## 2022-09-07 DIAGNOSIS — R110 Nausea: Secondary | ICD-10-CM | POA: Diagnosis present

## 2022-09-07 DIAGNOSIS — K219 Gastro-esophageal reflux disease without esophagitis: Secondary | ICD-10-CM | POA: Diagnosis present

## 2022-09-07 DIAGNOSIS — R1012 Left upper quadrant pain: Secondary | ICD-10-CM | POA: Diagnosis present

## 2022-09-07 MED ORDER — TECHNETIUM TC 99M SULFUR COLLOID: 2.1000 | Freq: Once | INTRAVENOUS | Status: DC | PRN

## 2022-09-11 ENCOUNTER — Other Ambulatory Visit: Payer: Self-pay

## 2022-09-11 DIAGNOSIS — K561 Intussusception: Secondary | ICD-10-CM

## 2022-09-11 DIAGNOSIS — R1012 Left upper quadrant pain: Secondary | ICD-10-CM

## 2022-09-11 DIAGNOSIS — R140 Abdominal distension (gaseous): Secondary | ICD-10-CM

## 2022-09-27 ENCOUNTER — Ambulatory Visit (INDEPENDENT_AMBULATORY_CARE_PROVIDER_SITE_OTHER): Payer: Medicare Other | Admitting: *Deleted

## 2022-09-27 DIAGNOSIS — E538 Deficiency of other specified B group vitamins: Secondary | ICD-10-CM

## 2022-09-27 NOTE — Progress Notes (Signed)
Pt given B12 injection IM left deltoid and tolerated well. °

## 2022-09-28 ENCOUNTER — Ambulatory Visit (HOSPITAL_COMMUNITY): Payer: Medicare Other

## 2022-10-12 ENCOUNTER — Ambulatory Visit (HOSPITAL_COMMUNITY)
Admission: RE | Admit: 2022-10-12 | Discharge: 2022-10-12 | Disposition: A | Discharge status: Home / Self Care | Payer: Medicare Other | Source: Ambulatory Visit | Attending: Gastroenterology | Admitting: Gastroenterology

## 2022-10-12 DIAGNOSIS — K561 Intussusception: Secondary | ICD-10-CM

## 2022-10-12 DIAGNOSIS — R140 Abdominal distension (gaseous): Secondary | ICD-10-CM | POA: Diagnosis present

## 2022-10-12 DIAGNOSIS — R1012 Left upper quadrant pain: Secondary | ICD-10-CM

## 2022-10-12 MED ORDER — IOHEXOL 300 MG/ML  SOLN
100.0000 mL | Freq: Once | INTRAMUSCULAR | Status: AC | PRN
  Administered 2022-10-12: 100 mL via INTRAVENOUS

## 2022-10-12 MED ORDER — BARIUM SULFATE 0.1 % PO SUSP
ORAL | Status: AC
  Filled 2022-10-12: qty 3

## 2022-10-12 MED ORDER — SODIUM CHLORIDE (PF) 0.9 % IJ SOLN
INTRAMUSCULAR | Status: AC
  Filled 2022-10-12: qty 50

## 2022-10-14 ENCOUNTER — Other Ambulatory Visit: Payer: Self-pay | Admitting: Family Medicine

## 2022-10-14 ENCOUNTER — Other Ambulatory Visit: Payer: Self-pay | Admitting: Nurse Practitioner

## 2022-10-14 DIAGNOSIS — K219 Gastro-esophageal reflux disease without esophagitis: Secondary | ICD-10-CM

## 2022-10-14 DIAGNOSIS — I10 Essential (primary) hypertension: Secondary | ICD-10-CM

## 2022-10-14 DIAGNOSIS — E782 Mixed hyperlipidemia: Secondary | ICD-10-CM

## 2022-10-29 ENCOUNTER — Ambulatory Visit (INDEPENDENT_AMBULATORY_CARE_PROVIDER_SITE_OTHER): Payer: Medicare Other | Admitting: *Deleted

## 2022-10-29 DIAGNOSIS — E538 Deficiency of other specified B group vitamins: Secondary | ICD-10-CM

## 2022-10-29 NOTE — Progress Notes (Signed)
B12 injection given, right deltoid, intramuscular. Patient tolerated well 

## 2022-11-29 ENCOUNTER — Ambulatory Visit (INDEPENDENT_AMBULATORY_CARE_PROVIDER_SITE_OTHER): Payer: Medicare Other

## 2022-11-29 DIAGNOSIS — E538 Deficiency of other specified B group vitamins: Secondary | ICD-10-CM

## 2022-12-13 ENCOUNTER — Other Ambulatory Visit: Payer: Self-pay | Admitting: Family Medicine

## 2022-12-13 DIAGNOSIS — E039 Hypothyroidism, unspecified: Secondary | ICD-10-CM

## 2022-12-25 ENCOUNTER — Other Ambulatory Visit (HOSPITAL_COMMUNITY): Payer: Self-pay | Admitting: Neurological Surgery

## 2022-12-25 ENCOUNTER — Other Ambulatory Visit: Payer: Self-pay | Admitting: Neurological Surgery

## 2022-12-25 DIAGNOSIS — M5412 Radiculopathy, cervical region: Secondary | ICD-10-CM

## 2022-12-28 ENCOUNTER — Ambulatory Visit (INDEPENDENT_AMBULATORY_CARE_PROVIDER_SITE_OTHER): Payer: Medicare Other

## 2022-12-28 DIAGNOSIS — E538 Deficiency of other specified B group vitamins: Secondary | ICD-10-CM

## 2022-12-28 NOTE — Progress Notes (Signed)
Cyanocobalamin injection given to right deltoid.  Patient tolerated well. 

## 2023-01-02 ENCOUNTER — Ambulatory Visit (HOSPITAL_COMMUNITY)
Admission: RE | Admit: 2023-01-02 | Discharge: 2023-01-02 | Disposition: A | Discharge status: Home / Self Care | Payer: Medicare Other | Source: Ambulatory Visit | Attending: Neurological Surgery | Admitting: Neurological Surgery

## 2023-01-02 DIAGNOSIS — M48061 Spinal stenosis, lumbar region without neurogenic claudication: Secondary | ICD-10-CM | POA: Insufficient documentation

## 2023-01-02 DIAGNOSIS — M4712 Other spondylosis with myelopathy, cervical region: Secondary | ICD-10-CM | POA: Insufficient documentation

## 2023-01-02 DIAGNOSIS — M4722 Other spondylosis with radiculopathy, cervical region: Secondary | ICD-10-CM | POA: Insufficient documentation

## 2023-01-02 DIAGNOSIS — M4802 Spinal stenosis, cervical region: Secondary | ICD-10-CM | POA: Insufficient documentation

## 2023-01-02 DIAGNOSIS — M5001 Cervical disc disorder with myelopathy,  high cervical region: Secondary | ICD-10-CM | POA: Diagnosis not present

## 2023-01-02 DIAGNOSIS — Z981 Arthrodesis status: Secondary | ICD-10-CM | POA: Diagnosis not present

## 2023-01-02 DIAGNOSIS — M5011 Cervical disc disorder with radiculopathy,  high cervical region: Secondary | ICD-10-CM | POA: Insufficient documentation

## 2023-01-02 DIAGNOSIS — M5412 Radiculopathy, cervical region: Secondary | ICD-10-CM

## 2023-01-02 MED ORDER — IOHEXOL 300 MG/ML  SOLN
10.0000 mL | Freq: Once | INTRAMUSCULAR | Status: AC | PRN
  Administered 2023-01-02: 10 mL via INTRATHECAL

## 2023-01-02 MED ORDER — ONDANSETRON HCL 4 MG/2ML IJ SOLN: 4.0000 mg | Freq: Once | INTRAMUSCULAR | Status: DC | PRN

## 2023-01-02 MED ORDER — LIDOCAINE HCL (PF) 1 % IJ SOLN
5.0000 mL | Freq: Once | INTRAMUSCULAR | Status: AC
  Administered 2023-01-02: 5 mL via INTRADERMAL

## 2023-01-02 MED ORDER — ONDANSETRON HCL 4 MG/2ML IJ SOLN: 4.0000 mg | Freq: Four times a day (QID) | INTRAMUSCULAR | Status: DC | PRN

## 2023-01-02 MED ORDER — HYDROCODONE-ACETAMINOPHEN 5-325 MG PO TABS
1.0000 | ORAL_TABLET | ORAL | Status: DC | PRN
  Administered 2023-01-02: 2 via ORAL
  Filled 2023-01-02: qty 2

## 2023-01-02 MED ORDER — DIAZEPAM 5 MG PO TABS
10.0000 mg | ORAL_TABLET | Freq: Once | ORAL | Status: AC
  Administered 2023-01-02: 10 mg via ORAL

## 2023-01-02 MED ORDER — DIAZEPAM 5 MG PO TABS
ORAL_TABLET | ORAL | Status: AC
  Filled 2023-01-02: qty 2

## 2023-01-02 NOTE — Procedures (Signed)
Jeffrey Hooper is a 62 year old individual whose had significant spondylitic disease throughout the spine.  He has had placement of a spinal cord stimulator which has not MRI compatible.  He has had previous cervical spondylitic disease but is having worsening radicular pain and some changes consistent with myelopathy of the cervical spine.  After careful evaluation and conservative treatment has been failed he is advised regarding the need for a myelogram and postmyelogram CAT scan of the cervical spine.  Pre op Dx: Cervical spondylosis with myelopathy and radiculopathy history of cervical fusion Post op Dx: Same Procedure: Cervical myelogram Surgeon: Ianna Salmela Puncture level: L2-3 Fluid color: Clear colorless Injection: Iohexol 300, 6 mL Findings: Poor opacification of the cervical spinal canal further evaluation with CT scanning.

## 2023-01-02 NOTE — Discharge Instructions (Signed)
Myelogram and Lumbar Puncture Discharge Instructions  Go home and rest quietly for the next 24 hours.  It is important to lie flat for the next 24 hours.  Get up only to go to the restroom.  You may lie in the bed or on a couch on your back, your stomach, your left side or your right side.  You may have one pillow under your head.  You may have pillows between your knees while you are on your side or under your knees while you are on your back.  DO NOT drive today.  Recline the seat as far back as it will go, while still wearing your seat belt, on the way home.  You may get up to go to the bathroom as needed.  You may sit up for 10 minutes to eat.  You may resume your normal diet and medications unless otherwise indicated.  The incidence of headache, nausea, or vomiting is about 5% (one in 20 patients).  If you develop a headache, lie flat and drink plenty of fluids until the headache goes away.  Caffeinated beverages may be helpful.  If you develop severe nausea and vomiting or a headache that does not go away with flat bed rest, call Dr Elsner.  You may resume normal activities after your 24 hours of bed rest is over; however, do not exert yourself strongly or do any heavy lifting tomorrow.  Call your physician for a follow-up appointment.  The results of your myelogram will be sent directly to your physician by the following day.  If you have any questions or if complications develop after you arrive home, please call Dr Elsner.  Discharge instructions have been explained to the patient.  The patient, or the person responsible for the patient, fully understands these instructions.   

## 2023-01-02 NOTE — Progress Notes (Signed)
Dr Danielle Dess notified of cont back pain and no new orders

## 2023-01-11 ENCOUNTER — Other Ambulatory Visit: Payer: Self-pay | Admitting: Family Medicine

## 2023-01-11 DIAGNOSIS — I10 Essential (primary) hypertension: Secondary | ICD-10-CM

## 2023-01-11 DIAGNOSIS — E782 Mixed hyperlipidemia: Secondary | ICD-10-CM

## 2023-01-14 ENCOUNTER — Other Ambulatory Visit: Payer: Self-pay | Admitting: Family Medicine

## 2023-01-14 ENCOUNTER — Encounter: Payer: Self-pay | Admitting: Family Medicine

## 2023-01-14 DIAGNOSIS — E782 Mixed hyperlipidemia: Secondary | ICD-10-CM

## 2023-01-14 DIAGNOSIS — I10 Essential (primary) hypertension: Secondary | ICD-10-CM

## 2023-01-14 NOTE — Telephone Encounter (Signed)
[place] NTBS 30 days given 01/11/23

## 2023-01-14 NOTE — Telephone Encounter (Signed)
LMTCB to schedule appt Letter mailed 

## 2023-01-30 ENCOUNTER — Encounter: Payer: Self-pay | Admitting: Family Medicine

## 2023-01-30 ENCOUNTER — Ambulatory Visit: Payer: Medicare Other

## 2023-01-30 ENCOUNTER — Ambulatory Visit (INDEPENDENT_AMBULATORY_CARE_PROVIDER_SITE_OTHER): Payer: Medicare Other | Admitting: Family Medicine

## 2023-01-30 VITALS — BP 134/79 | HR 60 | Temp 98.1°F | Resp 20 | Ht 70.0 in | Wt 207.0 lb

## 2023-01-30 DIAGNOSIS — E782 Mixed hyperlipidemia: Secondary | ICD-10-CM

## 2023-01-30 DIAGNOSIS — E039 Hypothyroidism, unspecified: Secondary | ICD-10-CM

## 2023-01-30 DIAGNOSIS — E538 Deficiency of other specified B group vitamins: Secondary | ICD-10-CM | POA: Diagnosis not present

## 2023-01-30 DIAGNOSIS — I10 Essential (primary) hypertension: Secondary | ICD-10-CM

## 2023-01-30 DIAGNOSIS — F411 Generalized anxiety disorder: Secondary | ICD-10-CM

## 2023-01-30 DIAGNOSIS — G8928 Other chronic postprocedural pain: Secondary | ICD-10-CM | POA: Diagnosis not present

## 2023-01-30 DIAGNOSIS — R0602 Shortness of breath: Secondary | ICD-10-CM

## 2023-01-30 MED ORDER — ATORVASTATIN CALCIUM 80 MG PO TABS: ORAL_TABLET | ORAL | 3 refills | Status: DC

## 2023-01-30 MED ORDER — LEVOTHYROXINE SODIUM 100 MCG PO TABS
100.0000 ug | ORAL_TABLET | Freq: Every day | ORAL | 3 refills | Status: DC
Start: 2023-01-30 — End: 2023-08-30

## 2023-01-30 MED ORDER — PAROXETINE HCL 20 MG PO TABS: 20.0000 mg | ORAL_TABLET | Freq: Every day | ORAL | 3 refills | Status: DC

## 2023-01-30 MED ORDER — HYDROCHLOROTHIAZIDE 50 MG PO TABS: 50.0000 mg | ORAL_TABLET | Freq: Every day | ORAL | 3 refills | Status: DC

## 2023-01-30 MED ORDER — ALBUTEROL SULFATE HFA 108 (90 BASE) MCG/ACT IN AERS
INHALATION_SPRAY | RESPIRATORY_TRACT | 1 refills | Status: AC
Start: 2023-01-30 — End: ?

## 2023-01-30 MED ORDER — AMLODIPINE BESYLATE 10 MG PO TABS: 10.0000 mg | ORAL_TABLET | Freq: Every day | ORAL | 3 refills | Status: DC

## 2023-01-30 NOTE — Progress Notes (Signed)
Subjective: CC: Chronic follow-up PCP: Raliegh Ip, DO UEA:VWUJW D Tumminello is a 62 y.o. male presenting to clinic today for:  1.  B12 deficiency Patient received B12 injection today.  He does not report any unusual changes or concerns today with regards to his B12 deficiency  2.  Chronic neck pain He is going to be seeing his specialist again in August for follow-up on his C-spine.  Apparently he did not heal entirely appropriately as he was not producing sufficient bone to secure the hardware that was placed.  He is now on a bone stimulator because and will be wearing this for the next couple of months.  Continues to use Norco as needed and has plenty of this if needed  3.  Hypothyroidism Patient reports compliance with thyroid replacement.  No reports of tremor, changes in bowel habits or energy.   ROS: Per HPI  Allergies  Allergen Reactions   Alpha-Gal Rash and Other (See Comments)    SEVERE ALLERGY TO ANY MEAT OR MEAT DERIVED PRODUCTS FROM 4 LEGGED ANIMALS > > BEEF, PORK , GOATS, DEER, ETC. < < RESULT OF BITE FROM LONE STAR TICK   Beef (Bovine) Protein Rash and Other (See Comments)   Beef-Derived Products Rash and Other (See Comments)   Lambs Quarters Rash and Other (See Comments)   Pork-Derived Products Rash and Other (See Comments)   Prednisone Other (See Comments)    im depo medrol caused dizziness - pt passed out. Ask pt before giving. Pt can take oral prednisone    Brilinta [Ticagrelor] Hives    Pt ate pork on the same day he took Brilinta before knowing he had alpha gal. May have been the alpha gal reaction, pt is unsure.    Doxycycline Hives   Gabapentin Swelling   Lyrica [Pregabalin]     Dry mouth and felt raw   Methylprednisolone Sodium Succ     im depo medrol caused dizziness - pt passed out. Ask pt before giving. Issue occurred when administered in the left arm    Penicillins Hives    Did it involve swelling of the face/tongue/throat, SOB, or low BP?  No Did it involve sudden or severe rash/hives, skin peeling, or any reaction on the inside of your mouth or nose? No Did you need to seek medical attention at a hospital or doctor's office? No When did it last happen?      10 + years ago If all above answers are "NO", may proceed with cephalosporin use.    Shellfish Allergy Hives   Adhesive [Tape] Rash   Fentanyl Rash    Reaction to adhesive, not the drug    Past Medical History:  Diagnosis Date   Allergy    Allergy to alpha-gal    Anxiety    Arthritis    CAD (coronary artery disease), native coronary artery 12/03/2015   Coronary artery disease    Dysrhythmia    "irregular and PAC'S"   GERD (gastroesophageal reflux disease)    Head injury with loss of consciousness (HCC)    back in the 1980's   History of kidney stones    Hyperlipidemia    Hypertension    Hypothyroid    Multiple benign nevi    Neuromuscular disorder (HCC) 2011   LEFT RADIAL NERVE SURGERY R/T TRAUMA   Pneumonia 2014, 2017   Prostatism    Pulmonary nodule    Spinal cord stimulator status    told to bring remote to surgery  Current Outpatient Medications:    acetaminophen (TYLENOL) 500 MG tablet, Take 1,000 mg by mouth daily as needed for mild pain or headache. , Disp: , Rfl:    aspirin EC 81 MG tablet, Take 81 mg by mouth at bedtime., Disp: , Rfl:    betamethasone dipropionate 0.05 % cream, Apply 1 Application topically 2 (two) times daily as needed (swelling/ itching)., Disp: , Rfl:    cetirizine (ZYRTEC) 10 MG tablet, Take 10 mg by mouth daily., Disp: , Rfl:    Cyanocobalamin (B-12 COMPLIANCE INJECTION) 1000 MCG/ML KIT, Inject 1,000 mcg as directed every 30 (thirty) days., Disp: , Rfl:    EPINEPHrine 0.3 mg/0.3 mL IJ SOAJ injection, Inject 0.3 mg into the muscle as needed for anaphylaxis., Disp: 2 each, Rfl: 3   fluticasone (FLONASE) 50 MCG/ACT nasal spray, Place 2 sprays into both nostrils daily., Disp: 48 g, Rfl: 1   HYDROcodone-acetaminophen (NORCO)  7.5-325 MG tablet, Take 1 tablet by mouth every 6 (six) hours as needed for moderate pain., Disp: , Rfl:    nitroGLYCERIN (NITROSTAT) 0.4 MG SL tablet, PLACE 1 TABLET UNDER THE TONGUE EVERY 5 MINUTES AS NEEDED FOR CHEST PAIN., Disp: 25 tablet, Rfl: 2   ondansetron (ZOFRAN-ODT) 4 MG disintegrating tablet, TAKE 1 TABLET BY MOUTH EVERY 8 HOURS AS NEEDED FOR NAUSEA AND VOMITING, Disp: 60 tablet, Rfl: 0   OVER THE COUNTER MEDICATION, Take 1 Dose by mouth daily. Cortexi otc liquid supplement, Disp: , Rfl:    pantoprazole (PROTONIX) 40 MG tablet, TAKE 1 TABLET BY MOUTH TWICE A DAY, Disp: 180 tablet, Rfl: 1   promethazine (PHENERGAN) 25 MG tablet, TAKE 0.5-1 TABLETS (12.5-25 MG TOTAL) BY MOUTH 2 (TWO) TIMES DAILY AS NEEDED FOR NAUSEA OR VOMITING., Disp: 45 tablet, Rfl: 1   sucralfate (CARAFATE) 1 g tablet, Take 1 tablet (1 g total) by mouth 4 (four) times daily -  with meals and at bedtime., Disp: 120 tablet, Rfl: 1   albuterol (VENTOLIN HFA) 108 (90 Base) MCG/ACT inhaler, TAKE 2 PUFFS BY MOUTH EVERY 6 HOURS AS NEEDED FOR WHEEZE OR SHORTNESS OF BREATH, Disp: 18 each, Rfl: 1   amLODipine (NORVASC) 10 MG tablet, Take 1 tablet (10 mg total) by mouth daily., Disp: 90 tablet, Rfl: 3   atorvastatin (LIPITOR) 80 MG tablet, TAKE 1 TABLET BY MOUTH EVERYDAY AT BEDTIME, Disp: 90 tablet, Rfl: 3   hydrochlorothiazide (HYDRODIURIL) 50 MG tablet, Take 1 tablet (50 mg total) by mouth daily., Disp: 90 tablet, Rfl: 3   levothyroxine (SYNTHROID) 100 MCG tablet, Take 1 tablet (100 mcg total) by mouth daily., Disp: 90 tablet, Rfl: 3   PARoxetine (PAXIL) 20 MG tablet, Take 1 tablet (20 mg total) by mouth daily., Disp: 90 tablet, Rfl: 3  Current Facility-Administered Medications:    cyanocobalamin ((VITAMIN B-12)) injection 1,000 mcg, 1,000 mcg, Intramuscular, Q30 days, Pete Merten M, DO, 1,000 mcg at 01/30/23 1605 Social History   Socioeconomic History   Marital status: Married    Spouse name: Not on file   Number of  children: 3   Years of education: Not on file   Highest education level: Not on file  Occupational History   Occupation: [employer]  Tobacco Use   Smoking status: Former    Packs/day: 1.00    Years: 20.00    Additional pack years: 0.00    Total pack years: 20.00    Types: Cigarettes    Quit date: 11/28/2006    Years since quitting: 16.1  Smokeless tobacco: Never  Vaping Use   Vaping Use: Never used  Substance and Sexual Activity   Alcohol use: Not Currently    Alcohol/week: 0.0 standard drinks of alcohol    Comment: rarely   Drug use: No   Sexual activity: Not Currently  Other Topics Concern   Not on file  Social History Narrative   Lives at home with wife, not working disabled from the city of [place]   They have an adopted son at home (age 18  2022)   Former smoker no alcohol tobacco or drug use now   Social Determinants of Corporate investment banker Strain: Low Risk  (07/12/2022)   Overall Financial Resource Strain (CARDIA)    Difficulty of Paying Living Expenses: Not hard at all  Food Insecurity: No Food Insecurity (07/12/2022)   Hunger Vital Sign    Worried About Running Out of Food in the Last Year: Never true    Ran Out of Food in the Last Year: Never true  Transportation Needs: No Transportation Needs (07/12/2022)   PRAPARE - Administrator, Civil Service (Medical): No    Lack of Transportation (Non-Medical): No  Physical Activity: Insufficiently Active (07/12/2022)   Exercise Vital Sign    Days of Exercise per Week: 5 days    Minutes of Exercise per Session: 10 min  Stress: No Stress Concern Present (07/12/2022)   [employer] of Occupational Health - Occupational Stress Questionnaire    Feeling of Stress : Not at all  Social Connections: Socially Integrated (07/12/2022)   Social Connection and Isolation Panel [NHANES]    Frequency of Communication with Friends and Family: More than three times a week    Frequency of Social  Gatherings with Friends and Family: More than three times a week    Attends Religious Services: More than 4 times per year    Active Member of [employer] or Organizations: Yes    Attends Engineer, structural: More than 4 times per year    Marital Status: Married  Catering manager Violence: Not At Risk (07/12/2022)   Humiliation, Afraid, Rape, and Kick questionnaire    Fear of Current or Ex-Partner: No    Emotionally Abused: No    Physically Abused: No    Sexually Abused: No   Family History  Problem Relation Age of Onset   Heart failure Mother 49       Died age 73   Congestive Heart Failure Mother    Heart attack Father 68       Died with MI   Heart attack Paternal Grandmother    Heart attack Paternal Grandfather    Colon cancer Neg Hx     Objective: Office vital signs reviewed. BP 134/79   Pulse 60   Temp 98.1 F (36.7 C) (Oral)   Resp 20   Ht 5\' 10"  (1.778 m)   Wt 207 lb (93.9 kg)   SpO2 97%   BMI 29.70 kg/m   Physical Examination:  General: Awake, alert, well nourished, No acute distress HEENT: sclera white, moist mucous membranes.  No exophthalmos Cardio: regular rate and rhythm, S1S2 heard, no murmurs appreciated Pulm: clear to auscultation bilaterally, no wheezes, rhonchi or rales; normal work of breathing on room air Extremities: warm, well perfused, No edema, cyanosis or clubbing; +2 pulses bilaterally MSK: C-spine with limited rotation, flexion and extension.  Assessment/ Plan: 62 y.o. male   B12 deficiency - Plan: CBC  Acquired hypothyroidism - Plan: TSH, T4,  Free, levothyroxine (SYNTHROID) 100 MCG tablet  Chronic postoperative pain  Mixed hyperlipidemia - Plan: atorvastatin (LIPITOR) 80 MG tablet  Essential hypertension - Plan: hydrochlorothiazide (HYDRODIURIL) 50 MG tablet  GAD (generalized anxiety disorder) - Plan: PARoxetine (PAXIL) 20 MG tablet  Shortness of breath - Plan: albuterol (VENTOLIN HFA) 108 (90 Base) MCG/ACT inhaler  B12  injection administered.  Check CBC  Asymptomatic from a thyroid standpoint.  Check thyroid levels.  Continue Synthroid  Continues to suffer from some chronic postop pain in the neck.  I am hopeful that this collar will help him recover from his surgery  We did not really discuss other issues today but I did refill all medications.  He will schedule full fasting labs and physical exam in a few months, sooner if concerns arise.  Meds have been sent x 1 year supply  Orders Placed This Encounter  Procedures   TSH   T4, Free   CBC   Meds ordered this encounter  Medications   amLODipine (NORVASC) 10 MG tablet    Sig: Take 1 tablet (10 mg total) by mouth daily.    Dispense:  90 tablet    Refill:  3   atorvastatin (LIPITOR) 80 MG tablet    Sig: TAKE 1 TABLET BY MOUTH EVERYDAY AT BEDTIME    Dispense:  90 tablet    Refill:  3   hydrochlorothiazide (HYDRODIURIL) 50 MG tablet    Sig: Take 1 tablet (50 mg total) by mouth daily.    Dispense:  90 tablet    Refill:  3   levothyroxine (SYNTHROID) 100 MCG tablet    Sig: Take 1 tablet (100 mcg total) by mouth daily.    Dispense:  90 tablet    Refill:  3   PARoxetine (PAXIL) 20 MG tablet    Sig: Take 1 tablet (20 mg total) by mouth daily.    Dispense:  90 tablet    Refill:  3   albuterol (VENTOLIN HFA) 108 (90 Base) MCG/ACT inhaler    Sig: TAKE 2 PUFFS BY MOUTH EVERY 6 HOURS AS NEEDED FOR WHEEZE OR SHORTNESS OF BREATH    Dispense:  18 each    Refill:  1     Miko Sirico Hulen Skains, DO [facility] [phone]

## 2023-01-31 LAB — CBC
Hematocrit: 41.3 % (ref 37.5–51.0)
Hemoglobin: 14.6 g/dL (ref 13.0–17.7)
MCH: 31.1 pg (ref 26.6–33.0)
MCHC: 35.4 g/dL (ref 31.5–35.7)
MCV: 88 fL (ref 79–97)
Platelets: 224 10*3/uL (ref 150–450)
RBC: 4.7 x10E6/uL (ref 4.14–5.80)
RDW: 12.9 % (ref 11.6–15.4)
WBC: 7.5 10*3/uL (ref 3.4–10.8)

## 2023-01-31 LAB — T4, FREE: Free T4: 1.61 ng/dL (ref 0.82–1.77)

## 2023-01-31 LAB — TSH: TSH: 2.35 u[IU]/mL (ref 0.450–4.500)

## 2023-02-01 ENCOUNTER — Other Ambulatory Visit: Payer: Self-pay | Admitting: Family Medicine

## 2023-02-01 DIAGNOSIS — R1012 Left upper quadrant pain: Secondary | ICD-10-CM

## 2023-02-01 DIAGNOSIS — R110 Nausea: Secondary | ICD-10-CM

## 2023-02-01 MED ORDER — ONDANSETRON 4 MG PO TBDP
ORAL_TABLET | ORAL | 0 refills | Status: DC
Start: 2023-02-01 — End: 2023-08-30

## 2023-03-04 ENCOUNTER — Ambulatory Visit (INDEPENDENT_AMBULATORY_CARE_PROVIDER_SITE_OTHER): Payer: Medicare Other

## 2023-03-04 DIAGNOSIS — E538 Deficiency of other specified B group vitamins: Secondary | ICD-10-CM | POA: Diagnosis not present

## 2023-03-04 NOTE — Progress Notes (Signed)
Cyanocobalamin injection given to left deltoid.  Patient tolerated well. 

## 2023-03-12 ENCOUNTER — Ambulatory Visit
Admission: RE | Admit: 2023-03-12 | Discharge: 2023-03-12 | Disposition: A | Discharge status: Home / Self Care | Payer: Medicare Other | Source: Ambulatory Visit | Attending: Family Medicine | Admitting: Family Medicine

## 2023-03-12 DIAGNOSIS — R918 Other nonspecific abnormal finding of lung field: Secondary | ICD-10-CM

## 2023-04-04 ENCOUNTER — Ambulatory Visit (INDEPENDENT_AMBULATORY_CARE_PROVIDER_SITE_OTHER): Payer: Medicare Other

## 2023-04-04 DIAGNOSIS — E538 Deficiency of other specified B group vitamins: Secondary | ICD-10-CM

## 2023-04-04 NOTE — Progress Notes (Signed)
Cyanocobalamin injection given to right deltoid.  Patient tolerated well. 

## 2023-04-10 ENCOUNTER — Other Ambulatory Visit: Payer: Self-pay | Admitting: Gastroenterology

## 2023-04-10 DIAGNOSIS — K219 Gastro-esophageal reflux disease without esophagitis: Secondary | ICD-10-CM

## 2023-05-06 ENCOUNTER — Ambulatory Visit (INDEPENDENT_AMBULATORY_CARE_PROVIDER_SITE_OTHER): Payer: Medicare Other | Admitting: *Deleted

## 2023-05-06 DIAGNOSIS — E538 Deficiency of other specified B group vitamins: Secondary | ICD-10-CM

## 2023-05-06 NOTE — Progress Notes (Signed)
B12 given left deltoid, pt tolerated well.

## 2023-05-08 ENCOUNTER — Other Ambulatory Visit: Payer: Self-pay | Admitting: Neurological Surgery

## 2023-05-08 DIAGNOSIS — M5412 Radiculopathy, cervical region: Secondary | ICD-10-CM

## 2023-05-09 ENCOUNTER — Encounter: Payer: Self-pay | Admitting: Neurological Surgery

## 2023-05-14 ENCOUNTER — Ambulatory Visit
Admission: RE | Admit: 2023-05-14 | Discharge: 2023-05-14 | Disposition: A | Discharge status: Home / Self Care | Payer: Medicare Other | Source: Ambulatory Visit | Attending: Neurological Surgery | Admitting: Neurological Surgery

## 2023-05-14 DIAGNOSIS — M5412 Radiculopathy, cervical region: Secondary | ICD-10-CM

## 2023-06-05 ENCOUNTER — Ambulatory Visit (INDEPENDENT_AMBULATORY_CARE_PROVIDER_SITE_OTHER): Payer: Medicare Other | Admitting: *Deleted

## 2023-06-05 DIAGNOSIS — E538 Deficiency of other specified B group vitamins: Secondary | ICD-10-CM | POA: Diagnosis not present

## 2023-06-05 NOTE — Progress Notes (Signed)
Patient in today for his monthly B 12 injection. Tolerated well.

## 2023-07-03 ENCOUNTER — Ambulatory Visit (INDEPENDENT_AMBULATORY_CARE_PROVIDER_SITE_OTHER): Payer: Medicare Other | Admitting: *Deleted

## 2023-07-03 DIAGNOSIS — E538 Deficiency of other specified B group vitamins: Secondary | ICD-10-CM | POA: Diagnosis not present

## 2023-07-03 NOTE — Progress Notes (Signed)
Vitamin b12 injection given and patient tolerated well.  

## 2023-07-16 ENCOUNTER — Ambulatory Visit: Payer: Medicare Other

## 2023-07-16 VITALS — Ht 70.0 in | Wt 207.0 lb

## 2023-07-16 DIAGNOSIS — Z0000 Encounter for general adult medical examination without abnormal findings: Secondary | ICD-10-CM

## 2023-07-16 NOTE — Progress Notes (Signed)
Subjective:   Jeffrey Hooper is a 62 y.o. male who presents for Medicare Annual/Subsequent preventive examination.  Visit Complete: Virtual I connected with  Belinda Fisher on 07/16/23 by a audio enabled telemedicine application and verified that I am speaking with the correct person using two identifiers.  Patient Location: Home  Provider Location: Home Office  I discussed the limitations of evaluation and management by telemedicine. The patient expressed understanding and agreed to proceed.  Vital Signs: Because this visit was a virtual/telehealth visit, some criteria may be missing or patient reported. Any vitals not documented were not able to be obtained and vitals that have been documented are patient reported.  Patient Medicare AWV questionnaire was completed by the patient on 07/12/23; I have confirmed that all information answered by patient is correct and no changes since this date.  Cardiac Risk Factors include: advanced age (>37men, >49 women);hypertension;male gender     Objective:    Today's Vitals   07/16/23 1458  Weight: 207 lb (93.9 kg)  Height: 5\' 10"  (1.778 m)   Body mass index is 29.7 kg/m.     07/16/2023    3:04 PM 01/02/2023    7:14 AM 07/12/2022    9:09 AM 03/07/2022    6:08 AM 07/11/2021    9:10 AM 09/01/2020    7:17 AM 08/25/2020    9:52 AM  Advanced Directives  Does Patient Have a Medical Advance Directive? No No No No No No No  Would patient like information on creating a medical advance directive? Yes (MAU/Ambulatory/Procedural Areas - Information given) Yes (MAU/Ambulatory/Procedural Areas - Information given) No - Patient declined No - Patient declined No - Patient declined No - Patient declined No - Patient declined    Current Medications (verified) Outpatient Encounter Medications as of 07/16/2023  Medication Sig   acetaminophen (TYLENOL) 500 MG tablet Take 1,000 mg by mouth daily as needed for mild pain or headache.    albuterol (VENTOLIN  HFA) 108 (90 Base) MCG/ACT inhaler TAKE 2 PUFFS BY MOUTH EVERY 6 HOURS AS NEEDED FOR WHEEZE OR SHORTNESS OF BREATH   amLODipine (NORVASC) 10 MG tablet Take 1 tablet (10 mg total) by mouth daily.   aspirin EC 81 MG tablet Take 81 mg by mouth at bedtime.   atorvastatin (LIPITOR) 80 MG tablet TAKE 1 TABLET BY MOUTH EVERYDAY AT BEDTIME   betamethasone dipropionate 0.05 % cream Apply 1 Application topically 2 (two) times daily as needed (swelling/ itching).   cetirizine (ZYRTEC) 10 MG tablet Take 10 mg by mouth daily.   Cyanocobalamin (B-12 COMPLIANCE INJECTION) 1000 MCG/ML KIT Inject 1,000 mcg as directed every 30 (thirty) days.   EPINEPHrine 0.3 mg/0.3 mL IJ SOAJ injection Inject 0.3 mg into the muscle as needed for anaphylaxis.   fluticasone (FLONASE) 50 MCG/ACT nasal spray Place 2 sprays into both nostrils daily.   hydrochlorothiazide (HYDRODIURIL) 50 MG tablet Take 1 tablet (50 mg total) by mouth daily.   HYDROcodone-acetaminophen (NORCO/VICODIN) 5-325 MG tablet Take 1 tablet by mouth every 6 (six) hours as needed for moderate pain (pain score 4-6).   levothyroxine (SYNTHROID) 100 MCG tablet Take 1 tablet (100 mcg total) by mouth daily.   nitroGLYCERIN (NITROSTAT) 0.4 MG SL tablet PLACE 1 TABLET UNDER THE TONGUE EVERY 5 MINUTES AS NEEDED FOR CHEST PAIN.   ondansetron (ZOFRAN-ODT) 4 MG disintegrating tablet TAKE 1 TABLET BY MOUTH EVERY 8 HOURS AS NEEDED FOR NAUSEA AND VOMITING   OVER THE COUNTER MEDICATION Take 1 Dose by mouth  daily. Cortexi otc liquid supplement   pantoprazole (PROTONIX) 40 MG tablet TAKE 1 TABLET BY MOUTH TWICE A DAY   PARoxetine (PAXIL) 20 MG tablet Take 1 tablet (20 mg total) by mouth daily.   promethazine (PHENERGAN) 25 MG tablet TAKE 0.5-1 TABLETS (12.5-25 MG TOTAL) BY MOUTH 2 (TWO) TIMES DAILY AS NEEDED FOR NAUSEA OR VOMITING.   [DISCONTINUED] sucralfate (CARAFATE) 1 g tablet Take 1 tablet (1 g total) by mouth 4 (four) times daily -  with meals and at bedtime.    Facility-Administered Encounter Medications as of 07/16/2023  Medication   cyanocobalamin ((VITAMIN B-12)) injection 1,000 mcg    Allergies (verified) Alpha-gal, Beef (bovine) protein, Beef-derived drug products, Lambs quarters, Pork-derived products, Prednisone, Brilinta [ticagrelor], Doxycycline, Gabapentin, Lyrica [pregabalin], Methylprednisolone sodium succ, Penicillins, Shellfish allergy, Adhesive [tape], and Fentanyl   History: Past Medical History:  Diagnosis Date   Allergy    Allergy to alpha-gal    Anxiety    Arthritis    CAD (coronary artery disease), native coronary artery 12/03/2015   Coronary artery disease    Dysrhythmia    "irregular and PAC'S"   GERD (gastroesophageal reflux disease)    Head injury with loss of consciousness (HCC)    back in the 1980's   History of kidney stones    Hyperlipidemia    Hypertension    Hypothyroid    Multiple benign nevi    Neuromuscular disorder (HCC) 2011   LEFT RADIAL NERVE SURGERY R/T TRAUMA   Pneumonia 2014, 2017   Prostatism    Pulmonary nodule    Spinal cord stimulator status    told to bring remote to surgery   Past Surgical History:  Procedure Laterality Date   BACK SURGERY     T12 - L1, 11 times including neck   CARDIAC CATHETERIZATION N/A 12/02/2015   Procedure: Left Heart Cath and Coronary Angiography;  Surgeon: Peter M Swaziland, MD;  Location: [facility] INVASIVE CV LAB;  Service: Cardiovascular;  Laterality: N/A;   CARDIAC CATHETERIZATION N/A 12/02/2015   Procedure: Coronary Stent Intervention;  Surgeon: Peter M Swaziland, MD;  Location: [facility] INVASIVE CV LAB;  Service: Cardiovascular;  Laterality: N/A;  mid LAD Promus 2.5x12   CERVICAL FUSION  2023   C4 and C5   COLONOSCOPY     CORONARY ANGIOPLASTY     one stent placed by Dr. P Swaziland   HARDWARE REMOVAL Right 08/26/2017   Procedure: Right Lumbar Five Revision of pedicle screw with Removal of Lumbar Five Screw;  Surgeon: Barnett Abu, MD;  Location: [facility] OR;  Service:  Neurosurgery;  Laterality: Right;  posterior   HARDWARE REMOVAL Right 12/27/2017   Procedure: Right Lumbar Two, Lumbar Three, Lumbar Four Pedicle screw removal with metrex;  Surgeon: Barnett Abu, MD;  Location: [facility] OR;  Service: Neurosurgery;  Laterality: Right;  Right L2 to L4 Pedicle screw removal with mterex   HEMORRHOID BANDING     HERNIA REPAIR     LAPAROSCOPY N/A 09/01/2020   Procedure: LAPAROSCOPY DIAGNOSTIC;  Surgeon: Romie Levee, MD;  Location: [facility] ORS;  Service: General;  Laterality: N/A;   MASS EXCISION  06/25/2012   Procedure: EXCISION MASS;  Surgeon: Nicki Reaper, MD;  Location: [facility];  Service: Orthopedics;  Laterality: Left;  transection of NEUROMA, BURYING RADIAL NERVE IN BRACHIORADIALIS LEFT SIDE   radial nerve Left    cut in work injury   RIGHT GREAT TOENAIL REMOVAL  04/2020   SPINAL FUSION     C6-7  TONSILLECTOMY  1982   UMBILICAL HERNIA REPAIR N/A 09/01/2020   Procedure: PRIMARY UMBILICAL HERNIA REPAIR;  Surgeon: Romie Levee, MD;  Location: [facility] ORS;  Service: General;  Laterality: N/A;   VASECTOMY     Family History  Problem Relation Age of Onset   Heart failure Mother 68       Died age 67   Congestive Heart Failure Mother    Heart attack Father 34       Died with MI   Heart attack Paternal Grandmother    Heart attack Paternal Grandfather    Colon cancer Neg Hx    Social History   Socioeconomic History   Marital status: Married    Spouse name: Not on file   Number of children: 3   Years of education: Not on file   Highest education level: Not on file  Occupational History   Occupation: [employer]  Tobacco Use   Smoking status: Former    Current packs/day: 0.00    Average packs/day: 1 pack/day for 20.0 years (20.0 ttl pk-yrs)    Types: Cigarettes    Start date: 11/28/1986    Quit date: 11/28/2006    Years since quitting: 16.6   Smokeless tobacco: Never  Vaping Use   Vaping status: Never Used  Substance and  Sexual Activity   Alcohol use: Not Currently    Comment: rarely   Drug use: No   Sexual activity: Not Currently  Other Topics Concern   Not on file  Social History Narrative   Lives at home with wife, not working disabled from the city of [place]   They have an adopted son at home (age 72  2022)   Former smoker no alcohol tobacco or drug use now   Social Determinants of Corporate investment banker Strain: Low Risk  (07/12/2023)   Overall Financial Resource Strain (CARDIA)    Difficulty of Paying Living Expenses: Not hard at all  Food Insecurity: No Food Insecurity (07/12/2023)   Hunger Vital Sign    Worried About Running Out of Food in the Last Year: Never true    Ran Out of Food in the Last Year: Never true  Transportation Needs: No Transportation Needs (07/12/2023)   PRAPARE - Administrator, Civil Service (Medical): No    Lack of Transportation (Non-Medical): No  Physical Activity: Sufficiently Active (07/12/2023)   Exercise Vital Sign    Days of Exercise per Week: 5 days    Minutes of Exercise per Session: 30 min  Stress: No Stress Concern Present (07/12/2023)   [employer] of Occupational Health - Occupational Stress Questionnaire    Feeling of Stress : Not at all  Social Connections: Socially Integrated (07/12/2023)   Social Connection and Isolation Panel [NHANES]    Frequency of Communication with Friends and Family: More than three times a week    Frequency of Social Gatherings with Friends and Family: Once a week    Attends Religious Services: More than 4 times per year    Active Member of [employer] or Organizations: Yes    Attends Engineer, structural: More than 4 times per year    Marital Status: Married    Tobacco Counseling Counseling given: Not Answered   Clinical Intake:  Pre-visit preparation completed: Yes  Pain : No/denies pain     Diabetes: No  How often do you need to have someone help you when you read instructions,  pamphlets, or other  written materials from your doctor or pharmacy?: 1 - Never  Interpreter Needed?: No  Information entered by :: Kandis Fantasia LPN   Activities of Daily Living    07/12/2023   11:02 AM  In your present state of health, do you have any difficulty performing the following activities:  Hearing? 1  Vision? 1  Difficulty concentrating or making decisions? 0  Walking or climbing stairs? 1  Dressing or bathing? 0  Doing errands, shopping? 0  Preparing Food and eating ? N  Using the Toilet? N  In the past six months, have you accidently leaked urine? N  Do you have problems with loss of bowel control? N  Managing your Medications? N  Managing your Finances? N  Housekeeping or managing your Housekeeping? N    Patient Care Team: Raliegh Ip, DO as PCP - General (Family Medicine) Rollene Rotunda, MD as PCP - Cardiology (Cardiology) Rachael Fee, MD as Attending Physician (Gastroenterology) Barnett Abu, MD as Consulting Physician (Neurosurgery) Delora Fuel, OD (Optometry)  Indicate any recent Medical Services you may have received from other than [facility] providers in the past year (date may be approximate).     Assessment:   This is a routine wellness examination for Tiwan.  Hearing/Vision screen Hearing Screening - Comments:: Denies hearing difficulties   Vision Screening - Comments:: Wears rx glasses - up to date with routine eye exams with [facility].     Goals Addressed   None   Depression Screen    07/16/2023    3:03 PM 01/30/2023    4:03 PM 07/25/2022   10:59 AM 07/12/2022    9:08 AM 03/23/2022    7:57 AM 12/20/2021    8:42 AM 08/22/2021   11:38 AM  PHQ 2/9 Scores  PHQ - 2 Score 0 0 0 0 0 0 0  PHQ- 9 Score  4   3      Fall Risk    07/12/2023   11:02 AM 01/30/2023    4:03 PM 07/25/2022   10:59 AM 07/12/2022    9:06 AM 03/23/2022    7:57 AM  Fall Risk   Falls in the past year? 0 0 0 0 0  Number falls in past yr: 0   0   Injury with  Fall? 0   0   Risk for fall due to : No Fall Risks   No Fall Risks   Follow up Falls prevention discussed;Education provided;Falls evaluation completed Falls evaluation completed  Falls prevention discussed     MEDICARE RISK AT HOME: Medicare Risk at Home Any stairs in or around the home?: No If so, are there any without handrails?: No Home free of loose throw rugs in walkways, pet beds, electrical cords, etc?: No Adequate lighting in your home to reduce risk of falls?: Yes Life alert?: No Use of a cane, walker or w/c?: Yes Grab bars in the bathroom?: No Shower chair or bench in shower?: Yes Elevated toilet seat or a handicapped toilet?: No  TIMED UP AND GO:  Was the test performed?  No    Cognitive Function:        07/16/2023    3:05 PM 07/12/2022    9:10 AM 07/11/2021    9:17 AM  6CIT Screen  What Year? 0 points 0 points 0 points  What month? 0 points 0 points 0 points  What time? 0 points 0 points 0 points  Count back from 20 0 points 0 points 0 points  Months  in reverse 0 points 0 points 0 points  Repeat phrase 0 points 0 points 0 points  Total Score 0 points 0 points 0 points    Immunizations Immunization History  Administered Date(s) Administered   Influenza Inj Mdck Quad Pf 05/01/2018   Influenza Whole 05/02/2012   Influenza,inj,Quad PF,6+ Mos 04/28/2013, 05/18/2015, 05/06/2017, 04/21/2019, 04/24/2021, 04/23/2022   Influenza-Unspecified 04/27/2014, 05/14/2016, 04/13/2023   Janssen (J&J) SARS-COV-2 Vaccination 01/05/2020   MMR 09/29/1990   Moderna SARS-COV2 Booster Vaccination 07/31/2020   Moderna Sars-Covid-2 Vaccination 07/31/2020   Td 04/12/2010   Tdap 05/16/2020   Zoster Recombinant(Shingrix) 08/08/2019, 12/17/2019    TDAP status: Up to date  Flu Vaccine status: Up to date  Pneumococcal vaccine status: Up to date  Covid-19 vaccine status: Information provided on how to obtain vaccines.   Qualifies for Shingles Vaccine? Yes   Zostavax completed  Yes   Shingrix Completed?: Yes  Screening Tests Health Maintenance  Topic Date Due   COVID-19 Vaccine (3 - 2023-24 season) 04/07/2023   Medicare Annual Wellness (AWV)  07/15/2024   DTaP/Tdap/Td (3 - Td or Tdap) 05/16/2030   Colonoscopy  12/14/2030   INFLUENZA VACCINE  Completed   Hepatitis C Screening  Completed   HIV Screening  Completed   Zoster Vaccines- Shingrix  Completed   HPV VACCINES  Aged Out    Health Maintenance  Health Maintenance Due  Topic Date Due   COVID-19 Vaccine (3 - 2023-24 season) 04/07/2023    Colorectal cancer screening: Type of screening: Colonoscopy. Completed 12/13/20. Repeat every 10 years  Lung Cancer Screening: (Low Dose CT Chest recommended if Age 69-80 years, 20 pack-year currently smoking OR have quit w/in 15years.) does not qualify.   Lung Cancer Screening Referral: n/a  Additional Screening:  Hepatitis C Screening: does qualify; Completed 08/07/13  Vision Screening: Recommended annual ophthalmology exams for early detection of glaucoma and other disorders of the eye. Is the patient up to date with their annual eye exam?  Yes  Who is the provider or what is the name of the office in which the patient attends annual eye exams? [facility].  If pt is not established with a provider, would they like to be referred to a provider to establish care? No .   Dental Screening: Recommended annual dental exams for proper oral hygiene  Community Resource Referral / Chronic Care Management: CRR required this visit?  No   CCM required this visit?  No     Plan:     I have personally reviewed and noted the following in the patient's chart:   Medical and social history Use of alcohol, tobacco or illicit drugs  Current medications and supplements including opioid prescriptions. Patient is currently taking opioid prescriptions. Information provided to patient regarding non-opioid alternatives. Patient advised to discuss non-opioid treatment plan with  their provider. Functional ability and status Nutritional status Physical activity Advanced directives List of other physicians Hospitalizations, surgeries, and ER visits in previous 12 months Vitals Screenings to include cognitive, depression, and falls Referrals and appointments  In addition, I have reviewed and discussed with patient certain preventive protocols, quality metrics, and best practice recommendations. A written personalized care plan for preventive services as well as general preventive health recommendations were provided to patient.     Kandis Fantasia [place], [place]   21/30/[id number]   After Visit Summary: (MyChart) Due to this being a telephonic visit, the after visit summary with patients personalized plan was offered to patient via MyChart   Nurse Notes: No  concerns at this time

## 2023-07-16 NOTE — Patient Instructions (Signed)
Jeffrey Hooper , Thank you for taking time to come for your Medicare Wellness Visit. I appreciate your ongoing commitment to your health goals. Please review the following plan we discussed and let me know if I can assist you in the future.   Referrals/Orders/Follow-Ups/Clinician Recommendations: Aim for 30 minutes of exercise or brisk walking, 6-8 glasses of water, and 5 servings of fruits and vegetables each day.  This is a list of the screening recommended for you and due dates:  Health Maintenance  Topic Date Due   COVID-19 Vaccine (3 - 2023-24 season) 04/07/2023   Medicare Annual Wellness Visit  07/15/2024   DTaP/Tdap/Td vaccine (3 - Td or Tdap) 05/16/2030   Colon Cancer Screening  12/14/2030   Flu Shot  Completed   Hepatitis C Screening  Completed   HIV Screening  Completed   Zoster (Shingles) Vaccine  Completed   HPV Vaccine  Aged Out    Advanced directives: (ACP Link)Information on Advanced Care Planning can be found at [facility] Advance Health Care Directives Advance Health Care Directives ([email or web address]   Next Medicare Annual Wellness Visit scheduled for next year: Yes

## 2023-08-02 ENCOUNTER — Ambulatory Visit (INDEPENDENT_AMBULATORY_CARE_PROVIDER_SITE_OTHER): Payer: Medicare Other | Admitting: *Deleted

## 2023-08-02 DIAGNOSIS — E538 Deficiency of other specified B group vitamins: Secondary | ICD-10-CM | POA: Diagnosis not present

## 2023-08-30 ENCOUNTER — Ambulatory Visit: Payer: Medicare Other | Admitting: Family Medicine

## 2023-08-30 ENCOUNTER — Encounter: Payer: Self-pay | Admitting: Family Medicine

## 2023-08-30 ENCOUNTER — Ambulatory Visit (INDEPENDENT_AMBULATORY_CARE_PROVIDER_SITE_OTHER): Payer: Medicare Other

## 2023-08-30 VITALS — BP 140/77 | HR 60 | Temp 98.4°F | Wt 219.0 lb

## 2023-08-30 DIAGNOSIS — I700 Atherosclerosis of aorta: Secondary | ICD-10-CM

## 2023-08-30 DIAGNOSIS — M79672 Pain in left foot: Secondary | ICD-10-CM

## 2023-08-30 DIAGNOSIS — E782 Mixed hyperlipidemia: Secondary | ICD-10-CM | POA: Diagnosis not present

## 2023-08-30 DIAGNOSIS — E039 Hypothyroidism, unspecified: Secondary | ICD-10-CM | POA: Diagnosis not present

## 2023-08-30 DIAGNOSIS — N411 Chronic prostatitis: Secondary | ICD-10-CM

## 2023-08-30 DIAGNOSIS — I10 Essential (primary) hypertension: Secondary | ICD-10-CM

## 2023-08-30 DIAGNOSIS — I25119 Atherosclerotic heart disease of native coronary artery with unspecified angina pectoris: Secondary | ICD-10-CM

## 2023-08-30 DIAGNOSIS — E538 Deficiency of other specified B group vitamins: Secondary | ICD-10-CM | POA: Diagnosis not present

## 2023-08-30 DIAGNOSIS — K219 Gastro-esophageal reflux disease without esophagitis: Secondary | ICD-10-CM

## 2023-08-30 DIAGNOSIS — R739 Hyperglycemia, unspecified: Secondary | ICD-10-CM

## 2023-08-30 DIAGNOSIS — R110 Nausea: Secondary | ICD-10-CM

## 2023-08-30 DIAGNOSIS — F411 Generalized anxiety disorder: Secondary | ICD-10-CM

## 2023-08-30 LAB — BAYER DCA HB A1C WAIVED: HB A1C (BAYER DCA - WAIVED): 5.7 % — ABNORMAL HIGH (ref 4.8–5.6)

## 2023-08-30 MED ORDER — CYANOCOBALAMIN 1000 MCG/ML IJ SOLN: 1000.0000 ug | Freq: Once | INTRAMUSCULAR | Status: DC

## 2023-08-30 MED ORDER — PAROXETINE HCL 20 MG PO TABS: 20.0000 mg | ORAL_TABLET | Freq: Every day | ORAL | 3 refills | Status: DC

## 2023-08-30 MED ORDER — PANTOPRAZOLE SODIUM 40 MG PO TBEC: 40.0000 mg | DELAYED_RELEASE_TABLET | Freq: Two times a day (BID) | ORAL | 3 refills | Status: DC

## 2023-08-30 MED ORDER — LEVOTHYROXINE SODIUM 100 MCG PO TABS: 100.0000 ug | ORAL_TABLET | Freq: Every day | ORAL | 3 refills | Status: DC

## 2023-08-30 MED ORDER — AMLODIPINE BESYLATE 10 MG PO TABS: 10.0000 mg | ORAL_TABLET | Freq: Every day | ORAL | 3 refills | Status: DC

## 2023-08-30 MED ORDER — ONDANSETRON 4 MG PO TBDP: ORAL_TABLET | ORAL | 0 refills | Status: AC

## 2023-08-30 MED ORDER — HYDROCHLOROTHIAZIDE 50 MG PO TABS: 50.0000 mg | ORAL_TABLET | Freq: Every day | ORAL | 3 refills | Status: DC

## 2023-08-30 MED ORDER — ATORVASTATIN CALCIUM 80 MG PO TABS
ORAL_TABLET | ORAL | 3 refills | Status: AC
Start: 2023-08-30 — End: ?

## 2023-08-30 NOTE — Progress Notes (Signed)
Jeffrey Hooper is a 63 y.o. male presents to office today for annual physical exam examination.    Concerns today include: 1. Left foot pain  reports about a 1-1/2 months ago he injured his left foot when he inadvertently stepped into a ventilation unit.  He has had quite a bit of pain and swelling but notes his swelling has gone down since that time.  Continues to have exquisite sensitivity and tenderness along the mid foot on the top even to water dropping on it.  At baseline he has been ambulating with a cane to this admit quite a bit difficult since his usual side that is affected is the right side.  He is under the care of Dr. Danielle Dess with next follow-up in May.  He is currently under a bone stimulator and does report some neck pain and was told that there is some bulging disks there.  His specialist asked for an A1c to be collected.  He reports that he is more irritable due to pain.  He is compliant with Paxil.  Intolerant to multiple medications including Lyrica and gabapentin  There are no preventive care reminders to display for this patient.  Refills needed today: all  Immunization History  Administered Date(s) Administered   Influenza Inj Mdck Quad Pf 05/01/2018   Influenza Whole 05/02/2012   Influenza,inj,Quad PF,6+ Mos 04/28/2013, 05/18/2015, 05/06/2017, 04/21/2019, 04/24/2021, 04/23/2022   Influenza-Unspecified 04/27/2014, 05/14/2016, 04/13/2023   Janssen (J&J) SARS-COV-2 Vaccination 01/05/2020   MMR 09/29/1990   Moderna SARS-COV2 Booster Vaccination 07/31/2020   Moderna Sars-Covid-2 Vaccination 07/31/2020   Td 04/12/2010   Tdap 05/16/2020   Zoster Recombinant(Shingrix) 08/08/2019, 12/17/2019   Past Medical History:  Diagnosis Date   Allergy    Allergy to alpha-gal    Anxiety    Arthritis    CAD (coronary artery disease), native coronary artery 12/03/2015   Coronary artery disease    Dysrhythmia    "irregular and PAC'S"   GERD (gastroesophageal reflux disease)     Head injury with loss of consciousness (HCC)    back in the 1980's   History of kidney stones    Hyperlipidemia    Hypertension    Hypothyroid    Multiple benign nevi    Neuromuscular disorder (HCC) 2011   LEFT RADIAL NERVE SURGERY R/T TRAUMA   Pneumonia 2014, 2017   Prostatism    Pulmonary nodule    Spinal cord stimulator status    told to bring remote to surgery   Social History   Socioeconomic History   Marital status: Married    Spouse name: Not on file   Number of children: 3   Years of education: Not on file   Highest education level: Not on file  Occupational History   Occupation: [employer]  Tobacco Use   Smoking status: Former    Current packs/day: 0.00    Average packs/day: 1 pack/day for 20.0 years (20.0 ttl pk-yrs)    Types: Cigarettes    Start date: 11/28/1986    Quit date: 11/28/2006    Years since quitting: 16.7   Smokeless tobacco: Never  Vaping Use   Vaping status: Never Used  Substance and Sexual Activity   Alcohol use: Not Currently    Comment: rarely   Drug use: No   Sexual activity: Not Currently  Other Topics Concern   Not on file  Social History Narrative   Lives at home with wife, not working disabled from the city  of [place]   They have an adopted son at home (age 52  2022)   Former smoker no alcohol tobacco or drug use now   Social Drivers of Corporate investment banker Strain: Low Risk  (08/30/2023)   Overall Financial Resource Strain (CARDIA)    Difficulty of Paying Living Expenses: Not very hard  Food Insecurity: No Food Insecurity (08/30/2023)   Hunger Vital Sign    Worried About Running Out of Food in the Last Year: Never true    Ran Out of Food in the Last Year: Never true  Transportation Needs: No Transportation Needs (08/30/2023)   PRAPARE - Administrator, Civil Service (Medical): No    Lack of Transportation (Non-Medical): No  Physical Activity: Sufficiently Active (08/30/2023)   Exercise Vital  Sign    Days of Exercise per Week: 5 days    Minutes of Exercise per Session: 30 min  Stress: No Stress Concern Present (08/30/2023)   [employer] of Occupational Health - Occupational Stress Questionnaire    Feeling of Stress : Not at all  Social Connections: Socially Integrated (08/30/2023)   Social Connection and Isolation Panel [NHANES]    Frequency of Communication with Friends and Family: More than three times a week    Frequency of Social Gatherings with Friends and Family: Once a week    Attends Religious Services: More than 4 times per year    Active Member of [employer] or Organizations: Yes    Attends Banker Meetings: More than 4 times per year    Marital Status: Married  Catering manager Violence: Not At Risk (08/30/2023)   Humiliation, Afraid, Rape, and Kick questionnaire    Fear of Current or Ex-Partner: No    Emotionally Abused: No    Physically Abused: No    Sexually Abused: No   Past Surgical History:  Procedure Laterality Date   BACK SURGERY     T12 - L1, 11 times including neck   CARDIAC CATHETERIZATION N/A 12/02/2015   Procedure: Left Heart Cath and Coronary Angiography;  Surgeon: Peter M Swaziland, MD;  Location: [facility] INVASIVE CV LAB;  Service: Cardiovascular;  Laterality: N/A;   CARDIAC CATHETERIZATION N/A 12/02/2015   Procedure: Coronary Stent Intervention;  Surgeon: Peter M Swaziland, MD;  Location: [facility] INVASIVE CV LAB;  Service: Cardiovascular;  Laterality: N/A;  mid LAD Promus 2.5x12   CERVICAL FUSION  2023   C4 and C5   COLONOSCOPY     CORONARY ANGIOPLASTY     one stent placed by Dr. P Swaziland   HARDWARE REMOVAL Right 08/26/2017   Procedure: Right Lumbar Five Revision of pedicle screw with Removal of Lumbar Five Screw;  Surgeon: Barnett Abu, MD;  Location: [facility] OR;  Service: Neurosurgery;  Laterality: Right;  posterior   HARDWARE REMOVAL Right 12/27/2017   Procedure: Right Lumbar Two, Lumbar Three, Lumbar Four Pedicle screw removal with metrex;   Surgeon: Barnett Abu, MD;  Location: [facility] OR;  Service: Neurosurgery;  Laterality: Right;  Right L2 to L4 Pedicle screw removal with mterex   HEMORRHOID BANDING     HERNIA REPAIR     LAPAROSCOPY N/A 09/01/2020   Procedure: LAPAROSCOPY DIAGNOSTIC;  Surgeon: Romie Levee, MD;  Location: [facility] ORS;  Service: General;  Laterality: N/A;   MASS EXCISION  06/25/2012   Procedure: EXCISION MASS;  Surgeon: Nicki Reaper, MD;  Location: [facility];  Service: Orthopedics;  Laterality: Left;  transection of NEUROMA, BURYING RADIAL NERVE IN BRACHIORADIALIS  LEFT SIDE   radial nerve Left    cut in work injury   RIGHT GREAT TOENAIL REMOVAL  04/2020   SPINAL FUSION     C6-7   TONSILLECTOMY  1982   UMBILICAL HERNIA REPAIR N/A 09/01/2020   Procedure: PRIMARY UMBILICAL HERNIA REPAIR;  Surgeon: Romie Levee, MD;  Location: [facility] ORS;  Service: General;  Laterality: N/A;   VASECTOMY     Family History  Problem Relation Age of Onset   Heart failure Mother 15       Died age 40   Congestive Heart Failure Mother    Heart attack Father 33       Died with MI   Heart attack Paternal Grandmother    Heart attack Paternal Grandfather    Colon cancer Neg Hx     Current Outpatient Medications:    acetaminophen (TYLENOL) 500 MG tablet, Take 1,000 mg by mouth daily as needed for mild pain or headache. , Disp: , Rfl:    albuterol (VENTOLIN HFA) 108 (90 Base) MCG/ACT inhaler, TAKE 2 PUFFS BY MOUTH EVERY 6 HOURS AS NEEDED FOR WHEEZE OR SHORTNESS OF BREATH, Disp: 18 each, Rfl: 1   aspirin EC 81 MG tablet, Take 81 mg by mouth at bedtime., Disp: , Rfl:    betamethasone dipropionate 0.05 % cream, Apply 1 Application topically 2 (two) times daily as needed (swelling/ itching)., Disp: , Rfl:    cetirizine (ZYRTEC) 10 MG tablet, Take 10 mg by mouth daily., Disp: , Rfl:    Cyanocobalamin (B-12 COMPLIANCE INJECTION) 1000 MCG/ML KIT, Inject 1,000 mcg as directed every 30 (thirty) days., Disp: , Rfl:    EPINEPHrine 0.3  mg/0.3 mL IJ SOAJ injection, Inject 0.3 mg into the muscle as needed for anaphylaxis., Disp: 2 each, Rfl: 3   fluticasone (FLONASE) 50 MCG/ACT nasal spray, Place 2 sprays into both nostrils daily., Disp: 48 g, Rfl: 1   HYDROcodone-acetaminophen (NORCO/VICODIN) 5-325 MG tablet, Take 1 tablet by mouth every 6 (six) hours as needed for moderate pain (pain score 4-6)., Disp: , Rfl:    nitroGLYCERIN (NITROSTAT) 0.4 MG SL tablet, PLACE 1 TABLET UNDER THE TONGUE EVERY 5 MINUTES AS NEEDED FOR CHEST PAIN., Disp: 25 tablet, Rfl: 2   OVER THE COUNTER MEDICATION, Take 1 Dose by mouth daily. Cortexi otc liquid supplement, Disp: , Rfl:    promethazine (PHENERGAN) 25 MG tablet, TAKE 0.5-1 TABLETS (12.5-25 MG TOTAL) BY MOUTH 2 (TWO) TIMES DAILY AS NEEDED FOR NAUSEA OR VOMITING., Disp: 45 tablet, Rfl: 1   amLODipine (NORVASC) 10 MG tablet, Take 1 tablet (10 mg total) by mouth daily., Disp: 90 tablet, Rfl: 3   atorvastatin (LIPITOR) 80 MG tablet, TAKE 1 TABLET BY MOUTH EVERYDAY AT BEDTIME, Disp: 90 tablet, Rfl: 3   hydrochlorothiazide (HYDRODIURIL) 50 MG tablet, Take 1 tablet (50 mg total) by mouth daily., Disp: 90 tablet, Rfl: 3   levothyroxine (SYNTHROID) 100 MCG tablet, Take 1 tablet (100 mcg total) by mouth daily., Disp: 90 tablet, Rfl: 3   ondansetron (ZOFRAN-ODT) 4 MG disintegrating tablet, TAKE 1 TABLET BY MOUTH EVERY 8 HOURS AS NEEDED FOR NAUSEA AND VOMITING, Disp: 20 tablet, Rfl: 0   pantoprazole (PROTONIX) 40 MG tablet, Take 1 tablet (40 mg total) by mouth 2 (two) times daily., Disp: 180 tablet, Rfl: 3   PARoxetine (PAXIL) 20 MG tablet, Take 1 tablet (20 mg total) by mouth daily., Disp: 90 tablet, Rfl: 3  Current Facility-Administered Medications:    cyanocobalamin ((VITAMIN B-12)) injection 1,000 mcg, 1,000  mcg, Intramuscular, Q30 days, Delynn Flavin M, DO, 1,000 mcg at 08/02/23 1610  Allergies  Allergen Reactions   Alpha-Gal Rash and Other (See Comments)    SEVERE ALLERGY TO ANY MEAT OR MEAT  DERIVED PRODUCTS FROM 4 LEGGED ANIMALS > > BEEF, PORK , GOATS, DEER, ETC. < < RESULT OF BITE FROM LONE STAR TICK   Beef (Bovine) Protein Rash and Other (See Comments)   Beef-Derived Drug Products Rash and Other (See Comments)   Lambs Quarters Rash and Other (See Comments)   Pork-Derived Products Rash and Other (See Comments)   Prednisone Other (See Comments)    im depo medrol caused dizziness - pt passed out. Ask pt before giving. Pt can take oral prednisone    Brilinta [Ticagrelor] Hives    Pt ate pork on the same day he took Brilinta before knowing he had alpha gal. May have been the alpha gal reaction, pt is unsure.    Doxycycline Hives   Gabapentin Swelling   Lyrica [Pregabalin]     Dry mouth and felt raw   Methylprednisolone Sodium Succ     im depo medrol caused dizziness - pt passed out. Ask pt before giving. Issue occurred when administered in the left arm    Penicillins Hives    Did it involve swelling of the face/tongue/throat, SOB, or low BP? No Did it involve sudden or severe rash/hives, skin peeling, or any reaction on the inside of your mouth or nose? No Did you need to seek medical attention at a hospital or doctor's office? No When did it last happen?      10 + years ago If all above answers are "NO", may proceed with cephalosporin use.    Shellfish Allergy Hives   Adhesive [Tape] Rash   Fentanyl Rash    Reaction to adhesive, not the drug      ROS: Review of Systems Pertinent items noted in HPI and remainder of comprehensive ROS otherwise negative.   Pos Tinnitis/ hearing loss  Physical exam BP (!) 140/77   Pulse 60   Temp 98.4 F (36.9 C)   Wt 219 lb (99.3 kg)   SpO2 99%   BMI 31.42 kg/m  General appearance: alert, cooperative, appears stated age, and no distress Head: Normocephalic, without obvious abnormality, atraumatic Eyes: negative findings: lids and lashes normal, conjunctivae and sclerae normal, corneas clear, and pupils equal, round, reactive to  light and accomodation Ears: normal TM's and external ear canals both ears Nose: Nares normal. Septum midline. Mucosa normal. No drainage or sinus tenderness. Throat: lips, mucosa, and tongue normal; teeth and gums normal Neck: no adenopathy, supple, symmetrical, trachea midline, and thyroid not enlarged, symmetric, no tenderness/mass/nodules Back:  Ambulates with use of cane.  Gait is antalgic Lungs: clear to auscultation bilaterally Chest wall: no tenderness Heart: regular rate and rhythm, S1, S2 normal, no murmur, click, rub or gallop Abdomen: obese, soft, +epigastric TTP Extremities:  Top of left midfoot with an appreciable healing bruise.  There are no palpable deformities but it is sensitive to the palpation.  No gross swelling, erythema or heat appreciated Pulses: 2+ and symmetric Skin: Skin color, texture, turgor normal. No rashes or lesions Lymph nodes: Cervical, supraclavicular, and axillary nodes normal. Neurologic: Grossly normal wears hearing aids.     08/30/2023    9:03 AM 07/16/2023    3:03 PM 01/30/2023    4:03 PM  Depression screen PHQ 2/9  Decreased Interest 0 0 0  Down, Depressed, Hopeless 0 0 0  PHQ - 2 Score 0 0 0  Altered sleeping 0  2  Tired, decreased energy 0  1  Change in appetite 0  0  Feeling bad or failure about yourself  0  0  Trouble concentrating 0  1  Moving slowly or fidgety/restless 0  0  Suicidal thoughts 0  0  PHQ-9 Score 0  4  Difficult doing work/chores   Not difficult at all      08/30/2023    9:03 AM 01/30/2023    4:04 PM 07/25/2022   10:59 AM 03/23/2022    7:57 AM  GAD 7 : Generalized Anxiety Score  Nervous, Anxious, on Edge 0 0 0 0  Control/stop worrying 0 0 0 0  Worry too much - different things 0 0 0 0  Trouble relaxing 0 2 0 2  Restless 0 1 0 2  Easily annoyed or irritable 0 1 0 0  Afraid - awful might happen 0 0 0 0  Total GAD 7 Score 0 4 0 4  Anxiety Difficulty  Not difficult at all Not difficult at all Not difficult at all      Assessment/ Plan: Jeffrey Hooper here for annual physical exam.   Acquired hypothyroidism - Plan: TSH + free T4, levothyroxine (SYNTHROID) 100 MCG tablet  Mixed hyperlipidemia - Plan: CMP14+EGFR, Lipid Panel, atorvastatin (LIPITOR) 80 MG tablet  Aortic atherosclerosis (HCC) - Plan: CMP14+EGFR, Lipid Panel  Atherosclerosis of native coronary artery of native heart with angina pectoris (HCC) - Plan: CMP14+EGFR, Lipid Panel  Essential hypertension - Plan: CMP14+EGFR, hydrochlorothiazide (HYDRODIURIL) 50 MG tablet  B12 deficiency - Plan: CBC, Vitamin B12, DISCONTINUED: cyanocobalamin (VITAMIN B12) injection 1,000 mcg  Chronic prostatitis - Plan: PSA  Elevated serum glucose - Plan: Bayer DCA Hb A1c Waived  Left foot pain - Plan: DG Foot Complete Left  Nausea - Plan: ondansetron (ZOFRAN-ODT) 4 MG disintegrating tablet  Gastroesophageal reflux disease without esophagitis - Plan: pantoprazole (PROTONIX) 40 MG tablet  GAD (generalized anxiety disorder) - Plan: PARoxetine (PAXIL) 20 MG tablet  Check thyroid levels.  Clinically euthymic  Check lipid, CMP.  Lipitor renewed to prevent progression of aortic atherosclerosis and CAD.  Blood pressure controlled.  No changes.  Medications have been renewed  Check PSA given history of chronic prostatitis.  He is clinically asymptomatic.  A1c also added for history of elevation in glucose  Plain films of the foot demonstrate no evidence of acute fracture.  Continue supportive care  Zofran renewed.  Advised to follow-up with GI given frequent need for antiemetics, ongoing epigastric tenderness despite use of PPI twice daily  Anxiety is chronic.  We did discuss consideration for transition from Paxil to Cymbalta should he decide to pursue something that may help with chronic pain as well  Counseled on healthy lifestyle choices, including diet (rich in fruits, vegetables and lean meats and low in salt and simple carbohydrates) and exercise  (at least 30 minutes of moderate physical activity daily).  Patient to follow up 6-12 months  Jeffrey Hooper M. Nadine Counts, DO

## 2023-08-30 NOTE — Patient Instructions (Addendum)
Make sure to schedule your yearly check up with Dr Antoine Poche. Schedule a follow up visit with gastroenterology since you are needing so much of that antinausea med. Let me know if you decide you want to switch from the Paroxetine to Duloxetine (cymbalta).  Cymbalta can help with mood and chronic pain.

## 2023-08-31 LAB — CMP14+EGFR
ALT: 32 [IU]/L (ref 0–44)
AST: 22 [IU]/L (ref 0–40)
Albumin: 4.3 g/dL (ref 3.9–4.9)
Alkaline Phosphatase: 121 [IU]/L (ref 44–121)
BUN/Creatinine Ratio: 13 (ref 10–24)
BUN: 13 mg/dL (ref 8–27)
Bilirubin Total: 0.9 mg/dL (ref 0.0–1.2)
CO2: 26 mmol/L (ref 20–29)
Calcium: 9.5 mg/dL (ref 8.6–10.2)
Chloride: 101 mmol/L (ref 96–106)
Creatinine, Ser: 1.02 mg/dL (ref 0.76–1.27)
Globulin, Total: 2 g/dL (ref 1.5–4.5)
Glucose: 95 mg/dL (ref 70–99)
Potassium: 3.5 mmol/L (ref 3.5–5.2)
Sodium: 143 mmol/L (ref 134–144)
Total Protein: 6.3 g/dL (ref 6.0–8.5)
eGFR: 83 mL/min/{1.73_m2} (ref >59)

## 2023-08-31 LAB — CBC
Hematocrit: 44 % (ref 37.5–51.0)
Hemoglobin: 15.2 g/dL (ref 13.0–17.7)
MCH: 30.6 pg (ref 26.6–33.0)
MCHC: 34.5 g/dL (ref 31.5–35.7)
MCV: 89 fL (ref 79–97)
Platelets: 241 10*3/uL (ref 150–450)
RBC: 4.96 x10E6/uL (ref 4.14–5.80)
RDW: 13.4 % (ref 11.6–15.4)
WBC: 7.1 10*3/uL (ref 3.4–10.8)

## 2023-08-31 LAB — VITAMIN B12: Vitamin B-12: 626 pg/mL (ref 232–1245)

## 2023-08-31 LAB — LIPID PANEL
Chol/HDL Ratio: 3.2 {ratio} (ref 0.0–5.0)
Cholesterol, Total: 145 mg/dL (ref 100–199)
HDL: 45 mg/dL (ref >39)
LDL Chol Calc (NIH): 73 mg/dL (ref 0–99)
Triglycerides: 156 mg/dL — ABNORMAL HIGH (ref 0–149)
VLDL Cholesterol Cal: 27 mg/dL (ref 5–40)

## 2023-08-31 LAB — PSA: Prostate Specific Ag, Serum: 1.6 ng/mL (ref 0.0–4.0)

## 2023-08-31 LAB — TSH+FREE T4
Free T4: 1.59 ng/dL (ref 0.82–1.77)
TSH: 2.29 u[IU]/mL (ref 0.450–4.500)

## 2023-09-30 ENCOUNTER — Ambulatory Visit (INDEPENDENT_AMBULATORY_CARE_PROVIDER_SITE_OTHER): Payer: Medicare Other | Admitting: *Deleted

## 2023-09-30 DIAGNOSIS — E538 Deficiency of other specified B group vitamins: Secondary | ICD-10-CM

## 2023-09-30 NOTE — Progress Notes (Signed)
 Patient is in office today for a nurse visit for B12 Injection. Patient Injection was given in the  Right deltoid. Patient tolerated injection well.

## 2023-10-29 ENCOUNTER — Ambulatory Visit (INDEPENDENT_AMBULATORY_CARE_PROVIDER_SITE_OTHER): Payer: Medicare Other | Admitting: *Deleted

## 2023-10-29 DIAGNOSIS — E538 Deficiency of other specified B group vitamins: Secondary | ICD-10-CM

## 2023-10-29 NOTE — Progress Notes (Signed)
 Patient is in office today for a nurse visit for B12 Injection. Patient Injection was given in the  Left deltoid. Patient tolerated injection well.

## 2023-11-22 ENCOUNTER — Telehealth: Payer: Self-pay | Admitting: Cardiology

## 2023-11-22 NOTE — Telephone Encounter (Signed)
 Pt c/o swelling/edema: STAT if pt has developed SOB within 24 hours  If swelling, where is the swelling located? Legs and feet  How much weight have you gained and in what time span? N/A  Have you gained 2 pounds in a day or 5 pounds in a week? N/A  Do you have a log of your daily weights (if so, list)? No  Are you currently taking a fluid pill? Yes  Are you currently SOB? No  Have you traveled recently in a car or plane for an extended period of time?

## 2023-11-22 NOTE — Telephone Encounter (Signed)
 Returned call to pt he states that he c/o SOB and has LE edema for the last "couple weeks" his wife made him call today to discuss this, she is a respiratory therapist. His weight is about 220# but it is up from 208 at his June 2024 appt-PCP and 219# at our last appt in jan 2025.  He does not sound SOB on the phone today when talking. He "starts out the day fine but as he goes thru the day SOB gets worse" also the swelling gets worse as well, wife states that he has 3-4 pitting edema by the end of the day. Pt takes HCTZ rx'd by PCP. His lasix  was d/c in 2019. This was rx'd by PCP as well. His last visit here with Hochrein 02-2022, he missed this years annual appt. He has only been seen by us  twice 02-2022 and 07-2020. But has only been asked to be seen by us  annually. Last visit with PCP that I can see is 08-2023 and 01-2023. What are next steps? Please advise

## 2023-11-29 ENCOUNTER — Ambulatory Visit (INDEPENDENT_AMBULATORY_CARE_PROVIDER_SITE_OTHER)

## 2023-11-29 DIAGNOSIS — E538 Deficiency of other specified B group vitamins: Secondary | ICD-10-CM | POA: Diagnosis not present

## 2023-11-29 NOTE — Progress Notes (Signed)
 Patient is in office today for a nurse visit for B12 Injection. Patient Injection was given in the  Right deltoid. Patient tolerated injection well.

## 2023-12-09 ENCOUNTER — Encounter: Payer: Self-pay | Admitting: Emergency Medicine

## 2023-12-09 ENCOUNTER — Ambulatory Visit: Attending: Emergency Medicine | Admitting: Emergency Medicine

## 2023-12-09 VITALS — BP 132/62 | HR 53 | Ht 70.0 in | Wt 217.4 lb

## 2023-12-09 DIAGNOSIS — I10 Essential (primary) hypertension: Secondary | ICD-10-CM | POA: Insufficient documentation

## 2023-12-09 DIAGNOSIS — R600 Localized edema: Secondary | ICD-10-CM | POA: Insufficient documentation

## 2023-12-09 DIAGNOSIS — R002 Palpitations: Secondary | ICD-10-CM | POA: Insufficient documentation

## 2023-12-09 DIAGNOSIS — I2510 Atherosclerotic heart disease of native coronary artery without angina pectoris: Secondary | ICD-10-CM | POA: Diagnosis present

## 2023-12-09 DIAGNOSIS — E785 Hyperlipidemia, unspecified: Secondary | ICD-10-CM | POA: Diagnosis present

## 2023-12-09 DIAGNOSIS — Z9861 Coronary angioplasty status: Secondary | ICD-10-CM | POA: Diagnosis not present

## 2023-12-09 MED ORDER — OLMESARTAN MEDOXOMIL 40 MG PO TABS: 40.0000 mg | ORAL_TABLET | Freq: Every day | ORAL | 2 refills | Status: DC

## 2023-12-09 NOTE — Progress Notes (Signed)
 Cardiology Office Note:    Date:  12/09/2023  ID:  Jeffrey Hooper, DOB 12/29/1960, MRN [id number] PCP: Eliodoro Guerin, DO  [facility] Providers Cardiologist:  Eilleen Grates, MD       Patient Profile:      Chief Complaint: Follow-up for coronary artery disease History of Present Illness:  Jeffrey Hooper is a 63 y.o. male with visit-pertinent history of coronary artery disease, hypertension, hyperlipidemia  Lexiscan  stress test 11/2015 suggested moderate size defect in the inferior wall with ischemia.  The EF was well-preserved.  Patient presented to [facility] on 12/02/2015 for cardiac catheterization and was found to have single-vessel obstructive coronary artery disease involving the mid LAD.  He underwent successful PCI with mid LAD DES.  Echocardiogram at that time showed LVEF 60 to 65% with no RWMA.  In 2021 he presented with chest pain and had a negative Lexiscan  Myoview .  ZIO 09/2019 showed NSR, rare PVCs and SVE's, one 4 beat run of SVT, with no sustained arrhythmias.  Patient was last seen in office on 02/14/2022 by Dr. Lavonne Prairie.  Patient was doing well without cardiovascular concerns or complaints.  His medication regimen was continued and he was to follow-up in 1 year.   Discussed the use of AI scribe software for clinical note transcription with the patient, who gave verbal consent to proceed.  History of Present Illness Jeffrey Hooper is a 63 year old male with coronary artery disease who presents for delayed 1 year follow-up and with acute complaints of worsening leg swelling and palpitations.  He notes he has been doing well overall since the last time he was seen in office.  He has not had any major cardiovascular events.  He experiences worsening edema in his legs and feet, severe enough to leave sock indentations.  He notes this has been ongoing for at least 2 years however has been steadily worsening over the past few months.  His swelling usually worsens throughout  the day and improves by morning time.  He is without any chest pains, shortness of breath, orthopnea, PND.  Palpitations have been present since 2017, following stent placement. They occur daily, lasting 15-20 seconds, 3-6 times a day, with no increase in frequency or duration.  He has been without any lightheadedness, dizziness, presyncope, syncope.  He is currently taking amlodipine  and hydrochlorothiazide  for blood pressure management.  Review of systems:  Please see the history of present illness. All other systems are reviewed and otherwise negative.     Home Medications:    Current Meds  Medication Sig   acetaminophen  (TYLENOL ) 500 MG tablet Take 1,000 mg by mouth daily as needed for mild pain or headache.    albuterol  (VENTOLIN  HFA) 108 (90 Base) MCG/ACT inhaler TAKE 2 PUFFS BY MOUTH EVERY 6 HOURS AS NEEDED FOR WHEEZE OR SHORTNESS OF BREATH   amLODipine  (NORVASC ) 10 MG tablet Take 1 tablet (10 mg total) by mouth daily.   aspirin  EC 81 MG tablet Take 81 mg by mouth at bedtime.   atorvastatin  (LIPITOR ) 80 MG tablet TAKE 1 TABLET BY MOUTH EVERYDAY AT BEDTIME   betamethasone  dipropionate 0.05 % cream Apply 1 Application topically 2 (two) times daily as needed (swelling/ itching).   cetirizine  (ZYRTEC ) 10 MG tablet Take 10 mg by mouth daily.   Cyanocobalamin  (B-12 COMPLIANCE INJECTION) 1000 MCG/ML KIT Inject 1,000 mcg as directed every 30 (thirty) days.   EPINEPHrine  0.3 mg/0.3 mL IJ SOAJ injection Inject 0.3 mg into the muscle as  needed for anaphylaxis.   fluticasone  (FLONASE ) 50 MCG/ACT nasal spray Place 2 sprays into both nostrils daily.   hydrochlorothiazide  (HYDRODIURIL ) 50 MG tablet Take 1 tablet (50 mg total) by mouth daily.   HYDROcodone -acetaminophen  (NORCO/VICODIN) 5-325 MG tablet Take 1 tablet by mouth every 6 (six) hours as needed for moderate pain (pain score 4-6).   levothyroxine  (SYNTHROID ) 100 MCG tablet Take 1 tablet (100 mcg total) by mouth daily.   meloxicam  (MOBIC )  7.5 MG tablet Take 7.5 mg by mouth daily.   nitroGLYCERIN  (NITROSTAT ) 0.4 MG SL tablet PLACE 1 TABLET UNDER THE TONGUE EVERY 5 MINUTES AS NEEDED FOR CHEST PAIN.   olmesartan (BENICAR) 40 MG tablet Take 1 tablet (40 mg total) by mouth daily.   ondansetron  (ZOFRAN -ODT) 4 MG disintegrating tablet TAKE 1 TABLET BY MOUTH EVERY 8 HOURS AS NEEDED FOR NAUSEA AND VOMITING   OVER THE COUNTER MEDICATION Take 1 Dose by mouth daily. Cortexi otc liquid supplement   pantoprazole  (PROTONIX ) 40 MG tablet Take 1 tablet (40 mg total) by mouth 2 (two) times daily.   PARoxetine  (PAXIL ) 20 MG tablet Take 1 tablet (20 mg total) by mouth daily.   promethazine  (PHENERGAN ) 25 MG tablet TAKE 0.5-1 TABLETS (12.5-25 MG TOTAL) BY MOUTH 2 (TWO) TIMES DAILY AS NEEDED FOR NAUSEA OR VOMITING.   Current Facility-Administered Medications for the 12/09/23 encounter (Office Visit) with [name], NP  Medication   cyanocobalamin  ((VITAMIN B-12)) injection 1,000 mcg   Studies Reviewed:   EKG Interpretation Date/Time:  Monday Dec 09 2023 08:41:55 EDT Ventricular Rate:  53 PR Interval:  200 QRS Duration:  102 QT Interval:  446 QTC Calculation: 418 R Axis:   30  Text Interpretation: Sinus bradycardia When compared with ECG of 05-Jul-2017 15:27, No significant change was found Confirmed by Palmer Bobo [phone]) on 12/09/2023 8:47:17 AM    ZIO 09/17/2019 NSR Rare PVCs and SVEs One 4 beat SVT run.   No sustained arrhythmias.   Lexiscan  stress test 09/17/2019 The left ventricular ejection fraction is hyperdynamic (>65%). Nuclear stress EF: 73%. There was no ST segment deviation noted during stress. Defect 1: There is a small defect of mild severity present in the apical inferior location. This is a low risk study. No ischemia identified.  Risk Assessment/Calculations:             Physical Exam:   VS:  BP 132/62   Pulse (!) 53   Ht 5\' 10"  (1.778 m)   Wt 217 lb 6.4 oz (98.6 kg)   SpO2 97%   BMI 31.19 kg/m     Wt Readings from Last 3 Encounters:  12/09/23 217 lb 6.4 oz (98.6 kg)  08/30/23 219 lb (99.3 kg)  07/16/23 207 lb (93.9 kg)    GEN: Well nourished, well developed in no acute distress NECK: No JVD; No carotid bruits CARDIAC: RRR, no murmurs, rubs, gallops RESPIRATORY:  Clear to auscultation without rales, wheezing or rhonchi  ABDOMEN: Soft, non-tender, non-distended EXTREMITIES: 1+ bilateral lower extremity edema; No acute deformity     Assessment and Plan:  Coronary artery disease S/p PCI/DES to mLAD on 11/2015 Lexiscan  Myoview  09/2019 negative for ischemia - Today and since the last time he was in clinic he denies any anginal symptoms.  There is no indication for further ischemic evaluation at this time - Continue aspirin  80 mg daily and atorvastatin  80 mg daily  Lower extremity swelling He has had bilateral lower extremity swelling that has been persistent for at least the  past 2 years.  Over the past 2 months the swelling has persistently worsened.  Swelling gradually increases throughout the day and tends to improve in the morning.  He has been without any shortness of breath, orthopnea, PND, DOE - On exam he has 1+ bilateral pedal edema but no swelling to bilateral calves/thighs - Plan will be to discontinue amlodipine  as this could be a potential contributor to his lower extremity edema.  Will replace with olmesartan. - Plan for echocardiogram to reevaluate his LV function - If echocardiogram reveals any decrease in his EF or diastolic dysfunction can trial loop diuretic at that point - Has been managed by PCP with hydrochlorothiazide  50 mg daily - BMET x 2 weeks  Hypertension Blood pressure today is 132/62 and under adequate control As noted above amlodipine  to be discontinued today due to potential side effect of lower extremity edema - Plan to start Olmesartan 40 mg daily - Continue hydrochlorothiazide  50 mg daily - BMET x 2 weeks  Hyperlipidemia LDL 73, HDL 45, TC 145,  TG 156 on 08/2023 LDL is slightly above goal of less than 70 - He will work on heart healthy dieting and physical exercise to reach his overall goal - Can consider repeating fasting lipid panel during follow-up visit - Continue atorvastatin  80 mg daily  Palpitations ZIO 09/2019 without atrial fibrillation or sustained arrhythmias He notes ongoing palpitations since at least 2017.  His palpitations occur daily, lasting 15-20 seconds at a time and occurring at least 3-6 times a day with no increase in frequency or duration - He denies any syncope, presyncope, lightheadedness, dizziness - EKG today shows sinus bradycardia - Overall symptoms remain controlled and stable.  No change in therapy at this time - With his baseline bradycardia would avoid AV nodal blocking agents to treat his palpitations      Dispo:  Return in about 2 months (around 02/08/2024).  Signed, [name], NP

## 2023-12-09 NOTE — Patient Instructions (Addendum)
 Medication Instructions:  STOP TAKING AMLODIPINE .  START TAKING OLMESARTAN 40 MG DAILY. TAKE YOUR BLOOD PRESSURE EVERY OTHER DAY AND CONTACT THE OFFICE IF YOUR BLOOD PRESSURE IS GREATER THAN 140/90.    Lab Work: Nutritional therapist IN 2 WEEKS.   Testing/Procedures: Your physician has requested that you have an ECHOCARDIOGRAM. Echocardiography is a painless test that uses sound waves to create images of your heart. It provides your doctor with information about the size and shape of your heart and how well your heart's chambers and valves are working. This procedure takes approximately one hour. There are no restrictions for this procedure. Please do NOT wear cologne, perfume, aftershave, or lotions (deodorant is allowed). Please arrive 15 minutes prior to your appointment time.  Please note: We ask at that you not bring children with you during ultrasound (echo/ vascular) testing. Due to room size and safety concerns, children are not allowed in the ultrasound rooms during exams. Our front office staff cannot provide observation of children in our lobby area while testing is being conducted. An adult accompanying a patient to their appointment will only be allowed in the ultrasound room at the discretion of the ultrasound technician under special circumstances. We apologize for any inconvenience.   Follow-Up: At [facility], you and your health needs are our priority.  As part of our continuing mission to provide you with exceptional heart care, our providers are all part of one team.  This team includes your primary Cardiologist (physician) and Advanced Practice Providers or APPs (Physician Assistants and Nurse Practitioners) who all work together to provide you with the care you need, when you need it.  Your next appointment:   KEEP JULY APPOINTMENT WITH DR. HOCHREIN, MD   Provider:   JAMES HOCHREIN, MD

## 2023-12-19 NOTE — Telephone Encounter (Signed)
 SAW FOUNTAIN 12-09-23

## 2023-12-23 ENCOUNTER — Inpatient Hospital Stay (HOSPITAL_BASED_OUTPATIENT_CLINIC_OR_DEPARTMENT_OTHER)
Admission: EM | Admit: 2023-12-23 | Discharge: 2023-12-25 | DRG: 309 | Disposition: A | Discharge status: Home / Self Care | Attending: Cardiology | Admitting: Cardiology

## 2023-12-23 ENCOUNTER — Telehealth: Payer: Self-pay | Admitting: Cardiology

## 2023-12-23 ENCOUNTER — Emergency Department (HOSPITAL_BASED_OUTPATIENT_CLINIC_OR_DEPARTMENT_OTHER)

## 2023-12-23 ENCOUNTER — Other Ambulatory Visit: Payer: Self-pay

## 2023-12-23 ENCOUNTER — Encounter (HOSPITAL_BASED_OUTPATIENT_CLINIC_OR_DEPARTMENT_OTHER): Payer: Self-pay | Admitting: Emergency Medicine

## 2023-12-23 DIAGNOSIS — N182 Chronic kidney disease, stage 2 (mild): Secondary | ICD-10-CM | POA: Diagnosis present

## 2023-12-23 DIAGNOSIS — E039 Hypothyroidism, unspecified: Secondary | ICD-10-CM | POA: Diagnosis present

## 2023-12-23 DIAGNOSIS — I2510 Atherosclerotic heart disease of native coronary artery without angina pectoris: Secondary | ICD-10-CM

## 2023-12-23 DIAGNOSIS — R519 Headache, unspecified: Secondary | ICD-10-CM | POA: Diagnosis not present

## 2023-12-23 DIAGNOSIS — Z981 Arthrodesis status: Secondary | ICD-10-CM

## 2023-12-23 DIAGNOSIS — Z79899 Other long term (current) drug therapy: Secondary | ICD-10-CM | POA: Diagnosis not present

## 2023-12-23 DIAGNOSIS — J45909 Unspecified asthma, uncomplicated: Secondary | ICD-10-CM | POA: Diagnosis present

## 2023-12-23 DIAGNOSIS — Z87891 Personal history of nicotine dependence: Secondary | ICD-10-CM | POA: Diagnosis not present

## 2023-12-23 DIAGNOSIS — I495 Sick sinus syndrome: Secondary | ICD-10-CM | POA: Diagnosis not present

## 2023-12-23 DIAGNOSIS — K219 Gastro-esophageal reflux disease without esophagitis: Secondary | ICD-10-CM | POA: Diagnosis present

## 2023-12-23 DIAGNOSIS — F419 Anxiety disorder, unspecified: Secondary | ICD-10-CM | POA: Diagnosis present

## 2023-12-23 DIAGNOSIS — I10 Essential (primary) hypertension: Secondary | ICD-10-CM

## 2023-12-23 DIAGNOSIS — D72829 Elevated white blood cell count, unspecified: Secondary | ICD-10-CM

## 2023-12-23 DIAGNOSIS — Z791 Long term (current) use of non-steroidal anti-inflammatories (NSAID): Secondary | ICD-10-CM

## 2023-12-23 DIAGNOSIS — R0789 Other chest pain: Secondary | ICD-10-CM

## 2023-12-23 DIAGNOSIS — N179 Acute kidney failure, unspecified: Secondary | ICD-10-CM | POA: Diagnosis not present

## 2023-12-23 DIAGNOSIS — R9431 Abnormal electrocardiogram [ECG] [EKG]: Secondary | ICD-10-CM | POA: Diagnosis not present

## 2023-12-23 DIAGNOSIS — I129 Hypertensive chronic kidney disease with stage 1 through stage 4 chronic kidney disease, or unspecified chronic kidney disease: Secondary | ICD-10-CM | POA: Diagnosis present

## 2023-12-23 DIAGNOSIS — E785 Hyperlipidemia, unspecified: Secondary | ICD-10-CM | POA: Diagnosis present

## 2023-12-23 DIAGNOSIS — Z955 Presence of coronary angioplasty implant and graft: Secondary | ICD-10-CM | POA: Diagnosis not present

## 2023-12-23 DIAGNOSIS — F32A Depression, unspecified: Secondary | ICD-10-CM | POA: Diagnosis present

## 2023-12-23 DIAGNOSIS — Z79890 Hormone replacement therapy (postmenopausal): Secondary | ICD-10-CM

## 2023-12-23 DIAGNOSIS — R001 Bradycardia, unspecified: Secondary | ICD-10-CM | POA: Diagnosis not present

## 2023-12-23 DIAGNOSIS — Z9682 Presence of neurostimulator: Secondary | ICD-10-CM | POA: Diagnosis not present

## 2023-12-23 DIAGNOSIS — Z7982 Long term (current) use of aspirin: Secondary | ICD-10-CM | POA: Diagnosis not present

## 2023-12-23 DIAGNOSIS — R42 Dizziness and giddiness: Secondary | ICD-10-CM

## 2023-12-23 LAB — CBC
HCT: 43.8 % (ref 39.0–52.0)
Hemoglobin: 15.3 g/dL (ref 13.0–17.0)
MCH: 30.7 pg (ref 26.0–34.0)
MCHC: 34.9 g/dL (ref 30.0–36.0)
MCV: 88 fL (ref 80.0–100.0)
Platelets: 240 10*3/uL (ref 150–400)
RBC: 4.98 MIL/uL (ref 4.22–5.81)
RDW: 13.2 % (ref 11.5–15.5)
WBC: 14 10*3/uL — ABNORMAL HIGH (ref 4.0–10.5)
nRBC: 0 % (ref 0.0–0.2)

## 2023-12-23 LAB — BASIC METABOLIC PANEL WITH GFR
Anion gap: 13 (ref 5–15)
BUN: 27 mg/dL — ABNORMAL HIGH (ref 8–23)
CO2: 26 mmol/L (ref 22–32)
Calcium: 10.2 mg/dL (ref 8.9–10.3)
Chloride: 99 mmol/L (ref 98–111)
Creatinine, Ser: 1.2 mg/dL (ref 0.61–1.24)
GFR, Estimated: >60 mL/min (ref >60)
Glucose, Bld: 110 mg/dL — ABNORMAL HIGH (ref 70–99)
Potassium: 3.8 mmol/L (ref 3.5–5.1)
Sodium: 138 mmol/L (ref 135–145)

## 2023-12-23 LAB — TSH: TSH: 2.8 u[IU]/mL (ref 0.350–4.500)

## 2023-12-23 LAB — TROPONIN T, HIGH SENSITIVITY
Troponin T High Sensitivity: <15 ng/L (ref <19)
Troponin T High Sensitivity: <15 ng/L (ref <19)

## 2023-12-23 MED ORDER — ACETAMINOPHEN 325 MG PO TABS
650.0000 mg | ORAL_TABLET | ORAL | Status: DC | PRN
  Administered 2023-12-24: 650 mg via ORAL
  Filled 2023-12-23: qty 2

## 2023-12-23 MED ORDER — ONDANSETRON HCL 4 MG/2ML IJ SOLN
4.0000 mg | Freq: Four times a day (QID) | INTRAMUSCULAR | Status: DC | PRN
Start: 2023-12-23 — End: 2023-12-25

## 2023-12-23 NOTE — H&P (Signed)
 Cardiology Admission History and Physical   Patient ID: Jeffrey Hooper MRN: [id number]; DOB: March 23, 1961   Admission date: 12/23/2023  PCP:  Eliodoro Guerin, DO   [facility] Providers Cardiologist:  Eilleen Grates, MD      Chief Complaint:  bradycardia  History of Present Illness:  Jeffrey Hooper is a 63 y.o. male with a hx of CAD s/p DES to mid-LAD (12/02/15), HTN, HLD,  who was seen on 12/24/2023 for the evaluation of bradycardia.  Patient with a prior history of CAD s/p DES to mid-LAD (12/02/15). He had an episode of recurrent chest pain with a negative stress test. Ziopatch on 09/2019 demonstrated NSR, rare PVCs, SVEs, one beat run of SVT without sustained arrhythmias. He was recently seen in outpatient follow-up on 12/09/23 reporting worsening LE edema, but otherwise denied any chest pain, shortness of breath, orthopnea, PND. He also noted frequent palpitations lasting 15-20 seconds, 3-6 times per day. At that time, he was taking amlodipine , hydrochlorothiazide  for BP control. Due to worsening lower extremity swelling, amlodipine  was discontinued and he was started on olmesartan  40mg  every day.   Patient reported adherence to medications. However, on Friday (5/16) patient reports he began to feel fatigue, shortness of breath, lightheadedness and chest pressure intermittently. He continued to track his HR twice daily, which returned between 36-38 during his checks from Friday-Monday morning. On Sunday night while laying down in bed, he developed acute diaphoresis and felt cold/clamy that lasted about 10-15 minutes. He reports continued intermittent palpitations that has been going on for several years. He denies any syncopal episode, but does feel brain fog. He feels short of breath and lightheaded consistently with exertion.     Past Medical History:  Diagnosis Date   Allergy     Allergy  to alpha-gal    Anxiety    Arthritis    CAD (coronary artery disease), native  coronary artery 12/03/2015   Coronary artery disease    Dysrhythmia    "irregular and PAC'S"   GERD (gastroesophageal reflux disease)    Head injury with loss of consciousness (HCC)    back in the 1980's   History of kidney stones    Hyperlipidemia    Hypertension    Hypothyroid    Multiple benign nevi    Neuromuscular disorder (HCC) 2011   LEFT RADIAL NERVE SURGERY R/T TRAUMA   Pneumonia 2014, 2017   Prostatism    Pulmonary nodule    Spinal cord stimulator status    told to bring remote to surgery    Past Surgical History:  Procedure Laterality Date   BACK SURGERY     T12 - L1, 11 times including neck   CARDIAC CATHETERIZATION N/A 12/02/2015   Procedure: Left Heart Cath and Coronary Angiography;  Surgeon: Peter M Swaziland, MD;  Location: [facility] INVASIVE CV LAB;  Service: Cardiovascular;  Laterality: N/A;   CARDIAC CATHETERIZATION N/A 12/02/2015   Procedure: Coronary Stent Intervention;  Surgeon: Peter M Swaziland, MD;  Location: [facility] INVASIVE CV LAB;  Service: Cardiovascular;  Laterality: N/A;  mid LAD Promus 2.5x12   CERVICAL FUSION  2023   C4 and C5   COLONOSCOPY     CORONARY ANGIOPLASTY     one stent placed by Dr. P Swaziland   HARDWARE REMOVAL Right 08/26/2017   Procedure: Right Lumbar Five Revision of pedicle screw with Removal of Lumbar Five Screw;  Surgeon: Elna Haggis, MD;  Location: [facility] OR;  Service: Neurosurgery;  Laterality: Right;  posterior  HARDWARE REMOVAL Right 12/27/2017   Procedure: Right Lumbar Two, Lumbar Three, Lumbar Four Pedicle screw removal with metrex;  Surgeon: Elna Haggis, MD;  Location: [facility] OR;  Service: Neurosurgery;  Laterality: Right;  Right L2 to L4 Pedicle screw removal with mterex   HEMORRHOID BANDING     HERNIA REPAIR     LAPAROSCOPY N/A 09/01/2020   Procedure: LAPAROSCOPY DIAGNOSTIC;  Surgeon: Joyce Nixon, MD;  Location: [facility] ORS;  Service: General;  Laterality: N/A;   MASS EXCISION  06/25/2012   Procedure: EXCISION MASS;  Surgeon: Kemp Patter,  MD;  Location: [facility];  Service: Orthopedics;  Laterality: Left;  transection of NEUROMA, BURYING RADIAL NERVE IN BRACHIORADIALIS LEFT SIDE   radial nerve Left    cut in work injury   RIGHT GREAT TOENAIL REMOVAL  04/2020   SPINAL FUSION     C6-7   TONSILLECTOMY  1982   UMBILICAL HERNIA REPAIR N/A 09/01/2020   Procedure: PRIMARY UMBILICAL HERNIA REPAIR;  Surgeon: Joyce Nixon, MD;  Location: [facility] ORS;  Service: General;  Laterality: N/A;   VASECTOMY       Medications Prior to Admission: Prior to Admission medications   Medication Sig Start Date End Date Taking? Authorizing Provider  acetaminophen  (TYLENOL ) 500 MG tablet Take 1,000 mg by mouth daily as needed for mild pain or headache.     [provider]  albuterol  (VENTOLIN  HFA) 108 (90 Base) MCG/ACT inhaler TAKE 2 PUFFS BY MOUTH EVERY 6 HOURS AS NEEDED FOR WHEEZE OR SHORTNESS OF BREATH 01/30/23   [name], DO  aspirin  EC 81 MG tablet Take 81 mg by mouth at bedtime.    [provider]  atorvastatin  (LIPITOR ) 80 MG tablet TAKE 1 TABLET BY MOUTH EVERYDAY AT BEDTIME 08/30/23   [name], DO  betamethasone  dipropionate 0.05 % cream Apply 1 Application topically 2 (two) times daily as needed (swelling/ itching).    [provider]  cetirizine  (ZYRTEC ) 10 MG tablet Take 10 mg by mouth daily.    [provider]  Cyanocobalamin  (B-12 COMPLIANCE INJECTION) 1000 MCG/ML KIT Inject 1,000 mcg as directed every 30 (thirty) days.    [provider]  EPINEPHrine  0.3 mg/0.3 mL IJ SOAJ injection Inject 0.3 mg into the muscle as needed for anaphylaxis. 12/20/21   Eliodoro Guerin, DO  fluticasone  (FLONASE ) 50 MCG/ACT nasal spray Place 2 sprays into both nostrils daily. 06/14/20   Eliodoro Guerin, DO  hydrochlorothiazide  (HYDRODIURIL ) 50 MG tablet Take 1 tablet (50 mg total) by mouth daily. 08/30/23   Eliodoro Guerin, DO  HYDROcodone -acetaminophen  (NORCO/VICODIN) 5-325 MG  tablet Take 1 tablet by mouth every 6 (six) hours as needed for moderate pain (pain score 4-6).    [provider]  levothyroxine  (SYNTHROID ) 100 MCG tablet Take 1 tablet (100 mcg total) by mouth daily. 08/30/23   Eliodoro Guerin, DO  meloxicam  (MOBIC ) 7.5 MG tablet Take 7.5 mg by mouth daily. 08/07/23   [provider]  nitroGLYCERIN  (NITROSTAT ) 0.4 MG SL tablet PLACE 1 TABLET UNDER THE TONGUE EVERY 5 MINUTES AS NEEDED FOR CHEST PAIN. 05/09/22   Eliodoro Guerin, DO  olmesartan  (BENICAR ) 40 MG tablet Take 1 tablet (40 mg total) by mouth daily. 12/09/23   [name], NP  ondansetron  (ZOFRAN -ODT) 4 MG disintegrating tablet TAKE 1 TABLET BY MOUTH EVERY 8 HOURS AS NEEDED FOR NAUSEA AND VOMITING 08/30/23   Gottschalk, Ashly M, DO  OVER THE COUNTER MEDICATION Take 1 Dose  by mouth daily. Cortexi otc liquid supplement    [provider]  pantoprazole  (PROTONIX ) 40 MG tablet Take 1 tablet (40 mg total) by mouth 2 (two) times daily. 08/30/23   Eliodoro Guerin, DO  PARoxetine  (PAXIL ) 20 MG tablet Take 1 tablet (20 mg total) by mouth daily. 08/30/23   Eliodoro Guerin, DO  promethazine  (PHENERGAN ) 25 MG tablet TAKE 0.5-1 TABLETS (12.5-25 MG TOTAL) BY MOUTH 2 (TWO) TIMES DAILY AS NEEDED FOR NAUSEA OR VOMITING. 11/15/21   Eliodoro Guerin, DO     Allergies:    Allergies  Allergen Reactions   Alpha-Gal Rash and Other (See Comments)    SEVERE ALLERGY  TO ANY MEAT OR MEAT DERIVED PRODUCTS FROM 4 LEGGED ANIMALS > > BEEF, PORK , GOATS, DEER, ETC. < < RESULT OF BITE FROM LONE STAR TICK   Beef (Bovine) Protein Rash and Other (See Comments)   Beef-Derived Drug Products Rash and Other (See Comments)   Lambs Quarters Rash and Other (See Comments)   Pork-Derived Products Rash and Other (See Comments)   Prednisone  Other (See Comments)    im depo medrol  caused dizziness - pt passed out. Ask pt before giving. Pt can take oral prednisone     Brilinta  [Ticagrelor ] Hives    Pt  ate pork on the same day he took Brilinta  before knowing he had alpha gal. May have been the alpha gal reaction, pt is unsure.    Doxycycline Hives   Gabapentin  Swelling   Lyrica [Pregabalin]     Dry mouth and felt raw   Methylprednisolone  Sodium Succ     im depo medrol  caused dizziness - pt passed out. Ask pt before giving. Issue occurred when administered in the left arm    Penicillins Hives    Did it involve swelling of the face/tongue/throat, SOB, or low BP? No Did it involve sudden or severe rash/hives, skin peeling, or any reaction on the inside of your mouth or nose? No Did you need to seek medical attention at a hospital or doctor's office? No When did it last happen?      10 + years ago If all above answers are "NO", may proceed with cephalosporin use.    Shellfish Allergy  Hives   Adhesive [Tape] Rash   Fentanyl  Rash    Reaction to adhesive, not the drug     Social History:   Social History   Socioeconomic History   Marital status: Married    Spouse name: Not on file   Number of children: 3   Years of education: Not on file   Highest education level: Not on file  Occupational History   Occupation: [employer]  Tobacco Use   Smoking status: Former    Current packs/day: 0.00    Average packs/day: 1 pack/day for 20.0 years (20.0 ttl pk-yrs)    Types: Cigarettes    Start date: 11/28/1986    Quit date: 11/28/2006    Years since quitting: 17.0   Smokeless tobacco: Never  Vaping Use   Vaping status: Never Used  Substance and Sexual Activity   Alcohol use: Not Currently    Comment: rarely   Drug use: No   Sexual activity: Not Currently  Other Topics Concern   Not on file  Social History Narrative   Lives at home with wife, not working disabled from the city of [place]   They have an adopted son at home (age 29  2022)   Former smoker no  alcohol tobacco or drug use now   Social Drivers of Corporate investment banker Strain: Low Risk  (08/30/2023)    Overall Financial Resource Strain (CARDIA)    Difficulty of Paying Living Expenses: Not very hard  Food Insecurity: No Food Insecurity (12/23/2023)   Hunger Vital Sign    Worried About Running Out of Food in the Last Year: Never true    Ran Out of Food in the Last Year: Never true  Transportation Needs: No Transportation Needs (12/23/2023)   PRAPARE - Administrator, Civil Service (Medical): No    Lack of Transportation (Non-Medical): No  Physical Activity: Sufficiently Active (08/30/2023)   Exercise Vital Sign    Days of Exercise per Week: 5 days    Minutes of Exercise per Session: 30 min  Stress: No Stress Concern Present (08/30/2023)   [employer] of Occupational Health - Occupational Stress Questionnaire    Feeling of Stress : Not at all  Social Connections: Patient Declined (12/23/2023)   Social Connection and Isolation Panel [NHANES]    Frequency of Communication with Friends and Family: Patient declined    Frequency of Social Gatherings with Friends and Family: Patient declined    Attends Religious Services: Patient declined    Database administrator or Organizations: Patient declined    Attends Banker Meetings: Patient declined    Marital Status: Patient declined  Intimate Partner Violence: Not At Risk (12/23/2023)   Humiliation, Afraid, Rape, and Kick questionnaire    Fear of Current or Ex-Partner: No    Emotionally Abused: No    Physically Abused: No    Sexually Abused: No    Family History:   The patient's family history includes Congestive Heart Failure in his mother; Heart attack in his paternal grandfather and paternal grandmother; Heart attack (age of onset: 57) in his father; Heart failure (age of onset: 69) in his mother. There is no history of Colon cancer.    ROS:  Please see the history of present illness.  All other ROS reviewed and negative.     Physical Exam/Data:   Vitals:   12/23/23 2100 12/23/23 2115 12/23/23 2201  12/24/23 0102  BP:   139/79 119/67  Pulse: (!) 42 (!) 44 (!) 55 (!) 46  Resp: 14 15 15 16   Temp:   98.8 F (37.1 C) 98.5 F (36.9 C)  TempSrc:   Oral Oral  SpO2: 98% 98% 99% 95%  Weight:   96.4 kg   Height:   5\' 10"  (1.778 m)    No intake or output data in the 24 hours ending 12/24/23 0203    12/23/2023   10:01 PM 12/09/2023    8:39 AM 08/30/2023    9:03 AM  Last 3 Weights  Weight (lbs) 212 lb 8.4 oz 217 lb 6.4 oz 219 lb  Weight (kg) 96.4 kg 98.612 kg 99.338 kg     Body mass index is 30.49 kg/m.  General:  Well nourished, well developed, in no acute distress HEENT: normal Neck: no JVD Vascular: No carotid bruits; Distal pulses 2+ bilaterally   Cardiac:  bradycardic, regular S1, S2; RRR; no murmur  Lungs:  clear to auscultation bilaterally, no wheezing, rhonchi or rales  Abd: soft, nontender, no hepatomegaly  Ext: trace bilateral LE edema  Musculoskeletal:  No deformities, BUE and BLE strength normal and equal Skin: warm and dry  Neuro:  CNs 2-12 intact, no focal abnormalities noted Psych:  Normal affect  EKG:  The ECG that was done  was personally reviewed and demonstrates sinus bradycardia  Relevant CV Studies: pending  Laboratory Data:  High Sensitivity Troponin:  No results for input(s): "TROPONINIHS" in the last 720 hours.    Chemistry Recent Labs  Lab 12/23/23 1303  NA 138  K 3.8  CL 99  CO2 26  GLUCOSE 110*  BUN 27*  CREATININE 1.20  CALCIUM  10.2  GFRNONAA >60  ANIONGAP 13    No results for input(s): "PROT", "ALBUMIN ", "AST", "ALT", "ALKPHOS", "BILITOT" in the last 168 hours. Lipids  Recent Labs  Lab 12/23/23 2325  CHOL 142  TRIG 133  HDL 46  LDLCALC 69  CHOLHDL 3.1   Hematology Recent Labs  Lab 12/23/23 1303  WBC 14.0*  RBC 4.98  HGB 15.3  HCT 43.8  MCV 88.0  MCH 30.7  MCHC 34.9  RDW 13.2  PLT 240   Thyroid   Recent Labs  Lab 12/23/23 1303  TSH 2.800   BNPNo results for input(s): "BNP", "PROBNP" in the last 168 hours.   DDimer No results for input(s): "DDIMER" in the last 168 hours.   Radiology/Studies:  DG Chest Portable 1 View Result Date: 12/23/2023 CLINICAL DATA:  Shortness of breath. EXAM: PORTABLE CHEST 1 VIEW COMPARISON:  Chest CT dated 08/25/2022. FINDINGS: No focal consolidation, pleural effusion or pneumothorax. The cardiac silhouette is within limits. Coronary vascular stent. Midthoracic similar. No acute osseous pathology. IMPRESSION: No active disease. Electronically Signed   By: Angus Bark M.D.   On: 12/23/2023 14:34     Assessment and Plan:   #Sinus bradycardia  #CAD s/p DES to mid-LAD 12/02/15 #HTN Patient switched from amlodipine  10mg  every day to olmesartan  40mg  2 weeks prior to developing bradycardia to the 30-40s with associated lightheadedness, fatigue, dyspnea, chest pressure. He also notes chronic palpitations, without clear evidence on prior ziopatch. It is unlikely his medication changes is causing his symptoms, however given his exertional symptoms and new bradycardia, will obtain TTE, monitor telemetry and assess chronotropic competence. May consider further ischemic evaluation given his known prior history and possible ppm.  --continue home olmesartan  40mg  every day  --decrease home hydrochlorothiazide  to 25mg  every day (home: 50mg  every day) --continue atorvastatin  80mg  every day --continue asa 81mg  every day --Will assess chronotropic competence in AM, if reduced, may consider ppm placement  --may consider further ischemic evaluation with nuclear stress test vs LHC --TTE --Maintain telemetry, Goal K~4, Mag~2  #Hypothyroidism: continue home levothyroxine  100mcg every day #GERD: continue home ppi bid #Asthma: continue home albuterol  q4h prn  #Anxiety/depression: continue home paroxetine  20mg  every day   Risk Assessment/Risk Scores:  Code Status: Full Code  Severity of Illness: The appropriate patient status for this patient is INPATIENT. Inpatient status is judged  to be reasonable and necessary in order to provide the required intensity of service to ensure the patient's safety. The patient's presenting symptoms, physical exam findings, and initial radiographic and laboratory data in the context of their chronic comorbidities is felt to place them at high risk for further clinical deterioration. Furthermore, it is not anticipated that the patient will be medically stable for discharge from the hospital within 2 midnights of admission.   * I certify that at the point of admission it is my clinical judgment that the patient will require inpatient hospital care spanning beyond 2 midnights from the point of admission due to high intensity of service, high risk for further deterioration and high frequency of surveillance required.*   For  questions or updates, please contact [facility] Please consult [email or web address] for contact info under     Signed, Milbert Alice, MD  12/24/2023 2:03 AM

## 2023-12-23 NOTE — Telephone Encounter (Signed)
 Pt c/o medication issue:  1. Name of Medication: Olmesartan  Medoxomil    2. How are you currently taking this medication (dosage and times per day)? As written  3. Are you having a reaction (difficulty breathing--STAT)? No  4. What is your medication issue? Pt states this medication is making his pulse rate lower than normal. Please advise

## 2023-12-23 NOTE — Telephone Encounter (Signed)
 Spoke with pt over the phone. He stated that his HR is normally in the 50s-60s but since Friday (5/16), HR has been in the 40s and dropped as low as 36. Pt states he became sweaty and clammy last night before bed. Pt states he does feel winded. Slightly light-headed at times. Pt is worried about taking the Olmesartan . Pt did take dose for this morning. Explained to pt that we will forward this to [facility], NP who last saw pt and prescribed the Olmesartan  for further guidance. Pt verbalized understanding and had no further questions at this time.

## 2023-12-23 NOTE — ED Triage Notes (Signed)
 Bradycardic since Friday. Lowest of 36. Endorses becoming sweaty and clammy last night. Recent med changes. Aaron Aas

## 2023-12-23 NOTE — ED Provider Notes (Signed)
 [place] EMERGENCY DEPARTMENT AT [facility] Provider Note   CSN: [id number] Arrival date & time: 12/23/23  1225     History  Chief Complaint  Patient presents with   Bradycardia    Jeffrey Hooper is a 63 y.o. male.  63 year old male with past medical history of coronary artery disease, hypertension, and hypothyroidism presenting to the emergency department today with bradycardia.  The patient states that over the past few days he has been having some chest pressure as well as feeling clammy.  He states that when this occurred last night he checked his heart rate as it was in the 30s.  He states that he has been keeping an eye on his heart rate and it has been in the 30s but he has been relatively asymptomatic until yesterday.  He did have some leg swelling and was recently transition from amlodipine  to losartan.  He denies any other recent medication changes.  Denies any chest discomfort currently.  He states that he has been having some dyspnea on exertion the past few days.        Home Medications Prior to Admission medications   Medication Sig Start Date End Date Taking? Authorizing Provider  acetaminophen  (TYLENOL ) 500 MG tablet Take 1,000 mg by mouth daily as needed for mild pain or headache.     [provider]  albuterol  (VENTOLIN  HFA) 108 (90 Base) MCG/ACT inhaler TAKE 2 PUFFS BY MOUTH EVERY 6 HOURS AS NEEDED FOR WHEEZE OR SHORTNESS OF BREATH 01/30/23   [name], DO  aspirin  EC 81 MG tablet Take 81 mg by mouth at bedtime.    [provider]  atorvastatin  (LIPITOR ) 80 MG tablet TAKE 1 TABLET BY MOUTH EVERYDAY AT BEDTIME 08/30/23   [name], DO  betamethasone  dipropionate 0.05 % cream Apply 1 Application topically 2 (two) times daily as needed (swelling/ itching).    [provider]  cetirizine  (ZYRTEC ) 10 MG tablet Take 10 mg by mouth daily.    [provider]  Cyanocobalamin  (B-12 COMPLIANCE INJECTION) 1000  MCG/ML KIT Inject 1,000 mcg as directed every 30 (thirty) days.    [provider]  EPINEPHrine  0.3 mg/0.3 mL IJ SOAJ injection Inject 0.3 mg into the muscle as needed for anaphylaxis. 12/20/21   Eliodoro Guerin, DO  fluticasone  (FLONASE ) 50 MCG/ACT nasal spray Place 2 sprays into both nostrils daily. 06/14/20   Eliodoro Guerin, DO  hydrochlorothiazide  (HYDRODIURIL ) 50 MG tablet Take 1 tablet (50 mg total) by mouth daily. 08/30/23   Eliodoro Guerin, DO  HYDROcodone -acetaminophen  (NORCO/VICODIN) 5-325 MG tablet Take 1 tablet by mouth every 6 (six) hours as needed for moderate pain (pain score 4-6).    [provider]  levothyroxine  (SYNTHROID ) 100 MCG tablet Take 1 tablet (100 mcg total) by mouth daily. 08/30/23   Eliodoro Guerin, DO  meloxicam  (MOBIC ) 7.5 MG tablet Take 7.5 mg by mouth daily. 08/07/23   [provider]  nitroGLYCERIN  (NITROSTAT ) 0.4 MG SL tablet PLACE 1 TABLET UNDER THE TONGUE EVERY 5 MINUTES AS NEEDED FOR CHEST PAIN. 05/09/22   Eliodoro Guerin, DO  olmesartan  (BENICAR ) 40 MG tablet Take 1 tablet (40 mg total) by mouth daily. 12/09/23   [name], NP  ondansetron  (ZOFRAN -ODT) 4 MG disintegrating tablet TAKE 1 TABLET BY MOUTH EVERY 8 HOURS AS NEEDED FOR NAUSEA AND VOMITING 08/30/23   Gottschalk, Ashly M, DO  OVER THE COUNTER MEDICATION Take 1 Dose by mouth daily. Cortexi otc  liquid supplement    [provider]  pantoprazole  (PROTONIX ) 40 MG tablet Take 1 tablet (40 mg total) by mouth 2 (two) times daily. 08/30/23   Eliodoro Guerin, DO  PARoxetine  (PAXIL ) 20 MG tablet Take 1 tablet (20 mg total) by mouth daily. 08/30/23   Eliodoro Guerin, DO  promethazine  (PHENERGAN ) 25 MG tablet TAKE 0.5-1 TABLETS (12.5-25 MG TOTAL) BY MOUTH 2 (TWO) TIMES DAILY AS NEEDED FOR NAUSEA OR VOMITING. 11/15/21   [name], DO      Allergies    Alpha-gal, Beef (bovine) protein, Beef-derived drug products, Lambs quarters, Pork-derived  products, Prednisone , Brilinta  [ticagrelor ], Doxycycline, Gabapentin , Lyrica [pregabalin], Methylprednisolone  sodium succ, Penicillins, Shellfish allergy , Adhesive [tape], and Fentanyl     Review of Systems   Review of Systems  Respiratory:  Positive for chest tightness and shortness of breath.   All other systems reviewed and are negative.   Physical Exam Updated Vital Signs BP 117/69 (BP Location: Left Arm)   Pulse (!) 47   Temp 98.6 F (37 C) (Oral)   Resp 17   Ht 5\' 10"  (1.778 m)   Wt 95.8 kg   SpO2 97%   BMI 30.30 kg/m  Physical Exam Vitals and nursing note reviewed.   Gen: NAD Eyes: PERRL, EOMI HEENT: no oropharyngeal swelling Neck: trachea midline Resp: clear to auscultation bilaterally Card: Bradycardic, no murmurs, rubs, or gallops Abd: nontender, nondistended Extremities: no calf tenderness, no edema Vascular: 2+ radial pulses bilaterally, 2+ DP pulses bilaterally Skin: no rashes Psyc: acting appropriately   ED Results / Procedures / Treatments   Labs (all labs ordered are listed, but only abnormal results are displayed) Labs Reviewed  BASIC METABOLIC PANEL WITH GFR - Abnormal; Notable for the following components:      Result Value   Glucose, Bld 110 (*)    BUN 27 (*)    All other components within normal limits  CBC - Abnormal; Notable for the following components:   WBC 14.0 (*)    All other components within normal limits  CBC WITH DIFFERENTIAL/PLATELET - Abnormal; Notable for the following components:   WBC 15.2 (*)    Neutro Abs 11.7 (*)    Monocytes Absolute 1.2 (*)    Abs Immature Granulocytes 0.10 (*)    All other components within normal limits  COMPREHENSIVE METABOLIC PANEL WITH GFR - Abnormal; Notable for the following components:   Glucose, Bld 150 (*)    ALT 58 (*)    All other components within normal limits  TSH  LACTIC ACID, PLASMA  MAGNESIUM   LIPID PANEL  MISC [facility] TEST (SEND OUT)  TROPONIN T, HIGH SENSITIVITY  TROPONIN T,  HIGH SENSITIVITY    EKG EKG Interpretation Date/Time:  Monday Dec 23 2023 12:33:27 EDT Ventricular Rate:  53 PR Interval:  187 QRS Duration:  96 QT Interval:  425 QTC Calculation: 399 R Axis:   41  Text Interpretation: Sinus rhythm Confirmed by Abner Hoffman [phone]) on 12/23/2023 12:38:34 PM  Radiology DG Chest Portable 1 View Result Date: 12/23/2023 CLINICAL DATA:  Shortness of breath. EXAM: PORTABLE CHEST 1 VIEW COMPARISON:  Chest CT dated 08/25/2022. FINDINGS: No focal consolidation, pleural effusion or pneumothorax. The cardiac silhouette is within limits. Coronary vascular stent. Midthoracic similar. No acute osseous pathology. IMPRESSION: No active disease. Electronically Signed   By: Angus Bark M.D.   On: 12/23/2023 14:34    Procedures Procedures    Medications Ordered in ED Medications  acetaminophen  (TYLENOL ) tablet 650 mg (  has no administration in time range)  ondansetron  (ZOFRAN ) injection 4 mg (has no administration in time range)  aspirin  EC tablet 81 mg (has no administration in time range)  atorvastatin  (LIPITOR ) tablet 80 mg (has no administration in time range)  hydrochlorothiazide  (HYDRODIURIL ) tablet 25 mg (has no administration in time range)  irbesartan (AVAPRO) tablet 300 mg (has no administration in time range)  PARoxetine  (PAXIL ) tablet 20 mg (has no administration in time range)  levothyroxine  (SYNTHROID ) tablet 100 mcg (100 mcg Oral Given 12/24/23 0635)  pantoprazole  (PROTONIX ) EC tablet 40 mg (has no administration in time range)  albuterol  (PROVENTIL ) (2.5 MG/3ML) 0.083% nebulizer solution 2.5 mg (has no administration in time range)    ED Course/ Medical Decision Making/ A&P                                 Medical Decision Making 63 year old male with past medical history of coronary artery disease as well as hypertension presenting to the emergency department today with bradycardia.  I will further evaluate the patient here with basic labs  well as an EKG, chest x-ray, and troponin for further evaluation for ACS, pulmonary edema, pulmonary infiltrates, or pneumothorax.  I will give the patient on the cardiac monitor to evaluate for arrhythmias or heart block.  His initial EKG interpreted by me shows a sinus bradycardia.  Will discuss his case with cardiology after his workup is complete for ultimate disposition.  Discussed with Dr. Renna Cary.  Given patient's symptoms we did discuss admission versus close outpatient follow-up for further observation given his symptoms and chest pressure.  Ultimately the patient decided that he would like to be observed.  He will be admitted to cardiology for further evaluation.  Amount and/or Complexity of Data Reviewed Labs: ordered. Radiology: ordered.  Risk Decision regarding hospitalization.           Final Clinical Impression(s) / ED Diagnoses Final diagnoses:  Symptomatic sinus bradycardia  Left chest pressure    Rx / DC Orders ED Discharge Orders     None         Carin Charleston, MD 12/24/23 [phone]

## 2023-12-23 NOTE — ED Notes (Signed)
 Patient ambulatory to restroom  ?

## 2023-12-23 NOTE — Telephone Encounter (Signed)
 Patient identification verified by 2 forms. Sims Duck, RN     Called and spoke to patient  Patient states:  - He went to be evaluated at [facility] ED. He may be transported to [facility] cath lab.               Interventions/Plan: - Patient will update our office once discharged.    Patient agrees with plan, no questions at this time

## 2023-12-23 NOTE — ED Notes (Signed)
 [facility] at bedside to transport pt to [facility]

## 2023-12-23 NOTE — Telephone Encounter (Signed)
 Left a message for the pt to call back.

## 2023-12-23 NOTE — Progress Notes (Signed)
 Patient arrived to room via [facility].  Pt A&Ox4.  Telemetry monitor placed on patient.  Patient oriented to room.  Voices no concerns at this time.  VSS.  Cardiology notified of patients arrival.

## 2023-12-24 ENCOUNTER — Inpatient Hospital Stay (HOSPITAL_COMMUNITY)

## 2023-12-24 DIAGNOSIS — R42 Dizziness and giddiness: Secondary | ICD-10-CM

## 2023-12-24 DIAGNOSIS — E785 Hyperlipidemia, unspecified: Secondary | ICD-10-CM

## 2023-12-24 DIAGNOSIS — R001 Bradycardia, unspecified: Principal | ICD-10-CM

## 2023-12-24 DIAGNOSIS — R0789 Other chest pain: Secondary | ICD-10-CM

## 2023-12-24 DIAGNOSIS — R9431 Abnormal electrocardiogram [ECG] [EKG]: Secondary | ICD-10-CM

## 2023-12-24 DIAGNOSIS — D72829 Elevated white blood cell count, unspecified: Secondary | ICD-10-CM

## 2023-12-24 DIAGNOSIS — I2510 Atherosclerotic heart disease of native coronary artery without angina pectoris: Secondary | ICD-10-CM

## 2023-12-24 DIAGNOSIS — E039 Hypothyroidism, unspecified: Secondary | ICD-10-CM

## 2023-12-24 DIAGNOSIS — I10 Essential (primary) hypertension: Secondary | ICD-10-CM

## 2023-12-24 LAB — ECHOCARDIOGRAM COMPLETE
Area-P 1/2: 3.07 cm2
Height: 70 in
S' Lateral: 3.2 cm
Weight: 3379.21 [oz_av]

## 2023-12-24 LAB — CBC WITH DIFFERENTIAL/PLATELET
Abs Immature Granulocytes: 0.1 10*3/uL — ABNORMAL HIGH (ref 0.00–0.07)
Basophils Absolute: 0 10*3/uL (ref 0.0–0.1)
Basophils Relative: 0 %
Eosinophils Absolute: 0.1 10*3/uL (ref 0.0–0.5)
Eosinophils Relative: 0 %
HCT: 45.6 % (ref 39.0–52.0)
Hemoglobin: 15.8 g/dL (ref 13.0–17.0)
Immature Granulocytes: 1 %
Lymphocytes Relative: 14 %
Lymphs Abs: 2.2 10*3/uL (ref 0.7–4.0)
MCH: 30.9 pg (ref 26.0–34.0)
MCHC: 34.6 g/dL (ref 30.0–36.0)
MCV: 89.1 fL (ref 80.0–100.0)
Monocytes Absolute: 1.2 10*3/uL — ABNORMAL HIGH (ref 0.1–1.0)
Monocytes Relative: 8 %
Neutro Abs: 11.7 10*3/uL — ABNORMAL HIGH (ref 1.7–7.7)
Neutrophils Relative %: 77 %
Platelets: 261 10*3/uL (ref 150–400)
RBC: 5.12 MIL/uL (ref 4.22–5.81)
RDW: 13.2 % (ref 11.5–15.5)
WBC: 15.2 10*3/uL — ABNORMAL HIGH (ref 4.0–10.5)
nRBC: 0 % (ref 0.0–0.2)

## 2023-12-24 LAB — COMPREHENSIVE METABOLIC PANEL WITH GFR
ALT: 58 U/L — ABNORMAL HIGH (ref 0–44)
AST: 23 U/L (ref 15–41)
Albumin: 3.7 g/dL (ref 3.5–5.0)
Alkaline Phosphatase: 77 U/L (ref 38–126)
Anion gap: 8 (ref 5–15)
BUN: 22 mg/dL (ref 8–23)
CO2: 27 mmol/L (ref 22–32)
Calcium: 9.4 mg/dL (ref 8.9–10.3)
Chloride: 103 mmol/L (ref 98–111)
Creatinine, Ser: 1.15 mg/dL (ref 0.61–1.24)
GFR, Estimated: >60 mL/min (ref >60)
Glucose, Bld: 150 mg/dL — ABNORMAL HIGH (ref 70–99)
Potassium: 4 mmol/L (ref 3.5–5.1)
Sodium: 138 mmol/L (ref 135–145)
Total Bilirubin: 1.1 mg/dL (ref 0.0–1.2)
Total Protein: 6.5 g/dL (ref 6.5–8.1)

## 2023-12-24 LAB — LIPID PANEL
Cholesterol: 142 mg/dL (ref 0–200)
HDL: 46 mg/dL (ref >40)
LDL Cholesterol: 69 mg/dL (ref 0–99)
Total CHOL/HDL Ratio: 3.1 ratio
Triglycerides: 133 mg/dL (ref <150)
VLDL: 27 mg/dL (ref 0–40)

## 2023-12-24 LAB — MAGNESIUM: Magnesium: 2.3 mg/dL (ref 1.7–2.4)

## 2023-12-24 LAB — LACTIC ACID, PLASMA: Lactic Acid, Venous: 1.4 mmol/L (ref 0.5–1.9)

## 2023-12-24 MED ORDER — ASPIRIN 81 MG PO TBEC
81.0000 mg | DELAYED_RELEASE_TABLET | Freq: Every day | ORAL | Status: DC
  Administered 2023-12-24: 81 mg via ORAL
  Filled 2023-12-24: qty 1

## 2023-12-24 MED ORDER — ALBUTEROL SULFATE (2.5 MG/3ML) 0.083% IN NEBU: 2.5000 mg | INHALATION_SOLUTION | RESPIRATORY_TRACT | Status: DC | PRN

## 2023-12-24 MED ORDER — HYDROCHLOROTHIAZIDE 25 MG PO TABS
25.0000 mg | ORAL_TABLET | Freq: Every day | ORAL | Status: DC
  Administered 2023-12-24 – 2023-12-25 (×2): 25 mg via ORAL
  Filled 2023-12-24 (×2): qty 1

## 2023-12-24 MED ORDER — PAROXETINE HCL 20 MG PO TABS
20.0000 mg | ORAL_TABLET | Freq: Every day | ORAL | Status: DC
  Administered 2023-12-24 – 2023-12-25 (×2): 20 mg via ORAL
  Filled 2023-12-24 (×2): qty 1

## 2023-12-24 MED ORDER — IRBESARTAN 300 MG PO TABS
300.0000 mg | ORAL_TABLET | Freq: Every day | ORAL | Status: DC
  Administered 2023-12-24 – 2023-12-25 (×2): 300 mg via ORAL
  Filled 2023-12-24 (×2): qty 1

## 2023-12-24 MED ORDER — LEVOTHYROXINE SODIUM 100 MCG PO TABS
100.0000 ug | ORAL_TABLET | Freq: Every day | ORAL | Status: DC
  Administered 2023-12-24 – 2023-12-25 (×2): 100 ug via ORAL
  Filled 2023-12-24 (×2): qty 1

## 2023-12-24 MED ORDER — ATORVASTATIN CALCIUM 80 MG PO TABS
80.0000 mg | ORAL_TABLET | Freq: Every day | ORAL | Status: DC
  Administered 2023-12-24: 80 mg via ORAL
  Filled 2023-12-24: qty 1

## 2023-12-24 MED ORDER — PANTOPRAZOLE SODIUM 40 MG PO TBEC
40.0000 mg | DELAYED_RELEASE_TABLET | Freq: Two times a day (BID) | ORAL | Status: DC
Start: 2023-12-24 — End: 2023-12-25
  Administered 2023-12-24 – 2023-12-25 (×3): 40 mg via ORAL
  Filled 2023-12-24 (×3): qty 1

## 2023-12-24 NOTE — Progress Notes (Signed)
 Echocardiogram 2D Echocardiogram has been performed.  Emmaline Haring Herald Vallin RDCS 12/24/2023, 1:31 PM

## 2023-12-24 NOTE — Plan of Care (Signed)

## 2023-12-24 NOTE — Plan of Care (Signed)
  Problem: Clinical Measurements: Goal: Diagnostic test results will improve Outcome: Progressing   Problem: Activity: Goal: Risk for activity intolerance will decrease 12/24/2023 2150 by Eddy Goodell, RN Outcome: Progressing 12/24/2023 2149 by Eddy Goodell, RN Outcome: Progressing   Problem: Coping: Goal: Level of anxiety will decrease 12/24/2023 2151 by Eddy Goodell, RN Outcome: Progressing 12/24/2023 2149 by Eddy Goodell, RN Outcome: Progressing

## 2023-12-24 NOTE — Plan of Care (Signed)
   Problem: Activity: Goal: Risk for activity intolerance will decrease Outcome: Progressing   Problem: Coping: Goal: Level of anxiety will decrease Outcome: Progressing

## 2023-12-24 NOTE — Plan of Care (Signed)
  Problem: Clinical Measurements: Goal: Diagnostic test results will improve Outcome: Progressing   Problem: Activity: Goal: Risk for activity intolerance will decrease 12/24/2023 2150 by Eddy Goodell, RN Outcome: Progressing 12/24/2023 2149 by Eddy Goodell, RN Outcome: Progressing   Problem: Coping: Goal: Level of anxiety will decrease Outcome: Progressing

## 2023-12-24 NOTE — Plan of Care (Signed)
  Problem: Education: Goal: Knowledge of General Education information will improve Description: Including pain rating scale, medication(s)/side effects and non-pharmacologic comfort measures 12/24/2023 1057 by Rema Care, RN Outcome: Progressing 12/24/2023 0743 by Rema Care, RN Outcome: Progressing   Problem: Health Behavior/Discharge Planning: Goal: Ability to manage health-related needs will improve 12/24/2023 1057 by Rema Care, RN Outcome: Progressing 12/24/2023 0743 by Rema Care, RN Outcome: Progressing   Problem: Clinical Measurements: Goal: Ability to maintain clinical measurements within normal limits will improve 12/24/2023 1057 by Rema Care, RN Outcome: Progressing 12/24/2023 0743 by Rema Care, RN Outcome: Progressing Goal: Will remain free from infection 12/24/2023 1057 by Rema Care, RN Outcome: Progressing 12/24/2023 0743 by Rema Care, RN Outcome: Progressing Goal: Diagnostic test results will improve 12/24/2023 1057 by Rema Care, RN Outcome: Progressing 12/24/2023 0743 by Rema Care, RN Outcome: Progressing Goal: Respiratory complications will improve 12/24/2023 1057 by Rema Care, RN Outcome: Progressing 12/24/2023 0743 by Rema Care, RN Outcome: Progressing Goal: Cardiovascular complication will be avoided 12/24/2023 1057 by Rema Care, RN Outcome: Progressing 12/24/2023 0743 by Rema Care, RN Outcome: Progressing   Problem: Activity: Goal: Risk for activity intolerance will decrease 12/24/2023 1057 by Rema Care, RN Outcome: Progressing 12/24/2023 0743 by Rema Care, RN Outcome: Progressing   Problem: Nutrition: Goal: Adequate nutrition will be maintained 12/24/2023 1057 by Rema Care, RN Outcome: Progressing 12/24/2023 0743 by Rema Care, RN Outcome: Progressing   Problem: Coping: Goal: Level of anxiety will decrease 12/24/2023 1057 by Rema Care, RN Outcome:  Progressing 12/24/2023 0743 by Rema Care, RN Outcome: Progressing   Problem: Elimination: Goal: Will not experience complications related to bowel motility 12/24/2023 1057 by Rema Care, RN Outcome: Progressing 12/24/2023 0743 by Rema Care, RN Outcome: Progressing Goal: Will not experience complications related to urinary retention 12/24/2023 1057 by Rema Care, RN Outcome: Progressing 12/24/2023 0743 by Rema Care, RN Outcome: Progressing   Problem: Pain Managment: Goal: General experience of comfort will improve and/or be controlled 12/24/2023 1057 by Rema Care, RN Outcome: Progressing 12/24/2023 0743 by Rema Care, RN Outcome: Progressing   Problem: Safety: Goal: Ability to remain free from injury will improve 12/24/2023 1057 by Rema Care, RN Outcome: Progressing 12/24/2023 0743 by Rema Care, RN Outcome: Progressing   Problem: Skin Integrity: Goal: Risk for impaired skin integrity will decrease 12/24/2023 1057 by Rema Care, RN Outcome: Progressing 12/24/2023 0743 by Rema Care, RN Outcome: Progressing   Problem: Education: Goal: Understanding of cardiac disease, CV risk reduction, and recovery process will improve 12/24/2023 1057 by Rema Care, RN Outcome: Progressing 12/24/2023 0743 by Rema Care, RN Outcome: Progressing Goal: Individualized Educational Video(s) 12/24/2023 1057 by Rema Care, RN Outcome: Progressing 12/24/2023 0743 by Rema Care, RN Outcome: Progressing   Problem: Activity: Goal: Ability to tolerate increased activity will improve 12/24/2023 1057 by Rema Care, RN Outcome: Progressing 12/24/2023 0743 by Rema Care, RN Outcome: Progressing   Problem: Cardiac: Goal: Ability to achieve and maintain adequate cardiovascular perfusion will improve 12/24/2023 1057 by Rema Care, RN Outcome: Progressing 12/24/2023 0743 by Rema Care, RN Outcome: Progressing    Problem: Health Behavior/Discharge Planning: Goal: Ability to safely manage health-related needs after discharge will improve 12/24/2023 1057 by Rema Care, RN Outcome: Progressing 12/24/2023 0743 by Rema Care, RN Outcome: Progressing

## 2023-12-24 NOTE — Plan of Care (Signed)

## 2023-12-24 NOTE — Progress Notes (Signed)
 Patient Name: Jeffrey Hooper Date of Encounter: 12/24/2023 [facility] Cardiologist: Eilleen Grates, MD   Interval Summary  .    Has had bradycardia (in the 30's), lightheadedness and foggyness that started on 12/20/23. Typical heart rates are in the 60's. Felt clammy on Sunday night. Felt nauseous on Saturday and Sunday. Has had substernal chest tightness that started yesterday. Continues to have the chest tightness. Is worse with palpation. Has a headache that started last night.  Vital Signs .    Vitals:   12/23/23 2201 12/24/23 0102 12/24/23 0452 12/24/23 0710  BP: 139/79 119/67 117/69 135/68  Pulse: (!) 55 (!) 46 (!) 47 (!) 43  Resp: 15 16 17 17   Temp: 98.8 F (37.1 C) 98.5 F (36.9 C) 98.6 F (37 C) 98.1 F (36.7 C)  TempSrc: Oral Oral Oral Oral  SpO2: 99% 95% 97% 99%  Weight: 96.4 kg  95.8 kg   Height: 5\' 10"  (1.778 m)       Intake/Output Summary (Last 24 hours) at 12/24/2023 0843 Last data filed at 12/24/2023 0700 Gross per 24 hour  Intake 120 ml  Output --  Net 120 ml      12/24/2023    4:52 AM 12/23/2023   10:01 PM 12/09/2023    8:39 AM  Last 3 Weights  Weight (lbs) 211 lb 3.2 oz 212 lb 8.4 oz 217 lb 6.4 oz  Weight (kg) 95.8 kg 96.4 kg 98.612 kg      Telemetry/ECG    Sinus bradycardia with heart rates in high 30's to 50's - Personally Reviewed  Physical Exam .   GEN: No acute distress.   Neck: No JVD Cardiac: bradycardic regular rhythm, no murmurs, rubs, or gallops. Chest tightness repeatable with palpation to chest/ epigastric area. Respiratory: Clear to auscultation bilaterally. GI: Soft, nontender, non-distended  MS: No edema  Assessment & Plan .    Jeffrey Hooper is a 63 y.o. male with a hx of CAD s/p DES to mid-LAD (12/02/15), HTN, HLD,  who was seen on 12/24/2023 for the evaluation of bradycardia.   Sinus bradycardia  CAD s/p DES to mid-LAD 12/02/15 HLD HTN Patient switched from amlodipine  10mg  every day to olmesartan  40mg  2 weeks  prior to developing bradycardia to the 30-40s with associated lightheadedness, fatigue, dyspnea, chest pressure. Lower extremity edema improved after stopping amlodipine . He also notes chronic palpitations, without clear evidence on prior ziopatch. It is unlikely his medication changes is causing his symptoms, however given his exertional symptoms and new bradycardia, will obtain TTE, monitor telemetry and assess chronotropic competence. High sensitivity troponin's negative, EKG showed sinus bradycardia with no changes suggestive of ischemia. May consider further ischemic evaluation given his known prior history and possible ppm.  --stop home olmesartan  40mg  every day  -- start irbesartan 300mg  daily --decrease home hydrochlorothiazide  to 25mg  every day (home: 50mg  every day) -- Avoid AV nodal agents --continue atorvastatin  80mg  every day. LDL 69. At GOAL. --continue asa 81mg  every day --Will assess chronotropic competence in AM using TTE. --may consider further ischemic evaluation with nuclear stress test vs LHC depending on TTE findings -- Plan for outpatient cardiac monitor. --Maintain telemetry, Goal K~4, Mag~2.  Leucocytosis (11.7 today) -- denies fever, dysuria.  Headache -- started last night -- may take PRN tylenol .   Hypothyroidism -- TSH normal -- continue home levothyroxine  100mcg every day  GERD  -- continue home ppi bid  Asthma  -- continue home albuterol  q4h prn   Anxiety/depression  --  continue home paroxetine  20mg  every day   For questions or updates, please contact [facility] Please consult [email or web address] for contact info under        Signed, Hunner Garcon, PA-C

## 2023-12-25 ENCOUNTER — Telehealth: Payer: Self-pay

## 2023-12-25 ENCOUNTER — Inpatient Hospital Stay (HOSPITAL_COMMUNITY)
Admit: 2023-12-25 | Discharge: 2023-12-25 | Disposition: A | Discharge status: Home / Self Care | Attending: Student | Admitting: Student

## 2023-12-25 DIAGNOSIS — D72829 Elevated white blood cell count, unspecified: Secondary | ICD-10-CM | POA: Diagnosis not present

## 2023-12-25 DIAGNOSIS — R42 Dizziness and giddiness: Secondary | ICD-10-CM

## 2023-12-25 DIAGNOSIS — R001 Bradycardia, unspecified: Secondary | ICD-10-CM

## 2023-12-25 LAB — URINALYSIS, W/ REFLEX TO CULTURE (INFECTION SUSPECTED)
Bacteria, UA: NONE SEEN
Bilirubin Urine: NEGATIVE
Glucose, UA: NEGATIVE mg/dL
Hgb urine dipstick: NEGATIVE
Ketones, ur: NEGATIVE mg/dL
Leukocytes,Ua: NEGATIVE
Nitrite: NEGATIVE
Protein, ur: NEGATIVE mg/dL
Specific Gravity, Urine: 1.021 (ref 1.005–1.030)
pH: 5 (ref 5.0–8.0)

## 2023-12-25 LAB — SEDIMENTATION RATE: Sed Rate: 3 mm/h (ref 0–16)

## 2023-12-25 LAB — CBC WITH DIFFERENTIAL/PLATELET
Abs Immature Granulocytes: 0.1 10*3/uL — ABNORMAL HIGH (ref 0.00–0.07)
Basophils Absolute: 0 10*3/uL (ref 0.0–0.1)
Basophils Relative: 0 %
Eosinophils Absolute: 0.1 10*3/uL (ref 0.0–0.5)
Eosinophils Relative: 1 %
HCT: 44.1 % (ref 39.0–52.0)
Hemoglobin: 15.3 g/dL (ref 13.0–17.0)
Immature Granulocytes: 1 %
Lymphocytes Relative: 14 %
Lymphs Abs: 2.1 10*3/uL (ref 0.7–4.0)
MCH: 31.4 pg (ref 26.0–34.0)
MCHC: 34.7 g/dL (ref 30.0–36.0)
MCV: 90.6 fL (ref 80.0–100.0)
Monocytes Absolute: 1.1 10*3/uL — ABNORMAL HIGH (ref 0.1–1.0)
Monocytes Relative: 7 %
Neutro Abs: 12 10*3/uL — ABNORMAL HIGH (ref 1.7–7.7)
Neutrophils Relative %: 77 %
Platelets: 238 10*3/uL (ref 150–400)
RBC: 4.87 MIL/uL (ref 4.22–5.81)
RDW: 13.3 % (ref 11.5–15.5)
WBC: 15.4 10*3/uL — ABNORMAL HIGH (ref 4.0–10.5)
nRBC: 0 % (ref 0.0–0.2)

## 2023-12-25 LAB — MISC LABCORP TEST (SEND OUT): Labcorp test code: 83935

## 2023-12-25 LAB — COMPREHENSIVE METABOLIC PANEL WITH GFR
ALT: 47 U/L — ABNORMAL HIGH (ref 0–44)
AST: 21 U/L (ref 15–41)
Albumin: 3.5 g/dL (ref 3.5–5.0)
Alkaline Phosphatase: 69 U/L (ref 38–126)
Anion gap: 9 (ref 5–15)
BUN: 27 mg/dL — ABNORMAL HIGH (ref 8–23)
CO2: 27 mmol/L (ref 22–32)
Calcium: 9.2 mg/dL (ref 8.9–10.3)
Chloride: 100 mmol/L (ref 98–111)
Creatinine, Ser: 1.29 mg/dL — ABNORMAL HIGH (ref 0.61–1.24)
GFR, Estimated: >60 mL/min (ref >60)
Glucose, Bld: 128 mg/dL — ABNORMAL HIGH (ref 70–99)
Potassium: 5 mmol/L (ref 3.5–5.1)
Sodium: 136 mmol/L (ref 135–145)
Total Bilirubin: 1 mg/dL (ref 0.0–1.2)
Total Protein: 5.9 g/dL — ABNORMAL LOW (ref 6.5–8.1)

## 2023-12-25 LAB — PROCALCITONIN: Procalcitonin: <0.1 ng/mL

## 2023-12-25 LAB — MAGNESIUM: Magnesium: 2.5 mg/dL — ABNORMAL HIGH (ref 1.7–2.4)

## 2023-12-25 LAB — C-REACTIVE PROTEIN: CRP: 0.6 mg/dL (ref <1.0)

## 2023-12-25 MED ORDER — HYDROCHLOROTHIAZIDE 25 MG PO TABS: 25.0000 mg | ORAL_TABLET | Freq: Every day | ORAL | 2 refills | Status: DC

## 2023-12-25 NOTE — Telephone Encounter (Signed)
 Copied from CRM [id number]. Topic: Appointments - Scheduling Inquiry for Clinic >> Dec 25, 2023  3:10 PM Santiya F wrote: Reason for CRM: Patient is being discharged today and needs a Hospital Follow Up. Providers first available isn't until August. Please follow up with Abran Abrahams, patient's nurse case manager at the hospital. [phone]

## 2023-12-25 NOTE — Discharge Summary (Signed)
 Discharge Summary    Patient ID: Jeffrey Hooper MRN: [id number]; DOB: 25-Aug-1960  Admit date: 12/23/2023 Discharge date: 12/25/2023  PCP:  Eliodoro Guerin, DO   [facility] Providers Cardiologist:  Eilleen Grates, MD        Discharge Diagnoses    Principal Problem:   Symptomatic bradycardia Active Problems:   Bradycardia   Left chest pressure   Leukocytosis   Coronary artery disease involving native coronary artery of native heart without angina pectoris   Lightheadedness    Diagnostic Studies/Procedures    IMPRESSIONS     1. Left ventricular ejection fraction, by estimation, is 60 to 65%. The  left ventricle has normal function. The left ventricle has no regional  wall motion abnormalities. There is mild concentric left ventricular  hypertrophy. Left ventricular diastolic  parameters were normal.   2. Right ventricular systolic function is normal. The right ventricular  size is mildly enlarged. Tricuspid regurgitation signal is inadequate for  assessing PA pressure.   3. Left atrial size was mildly dilated.   4. The mitral valve is normal in structure. Trivial mitral valve  regurgitation.   5. The aortic valve is tricuspid. Aortic valve regurgitation is not  visualized.   6. There is borderline dilatation of the ascending aorta, measuring 39  mm.   7. The inferior vena cava is normal in size with greater than 50%  respiratory variability, suggesting right atrial pressure of 3 mmHg.   Comparison(s): Prior images unable to be directly viewed, comparison made  by report only.  _____________   History of Present Illness     Jeffrey Hooper is a 63 y.o. male with a hx of CAD s/p DES to mid-LAD (12/02/15), HTN, HLD, hiatial hernia, history of back pain with multiple lumbar sacral fusions, who was seen on 12/24/2023 for the evaluation of bradycardia.   He was seen by Palmer Bobo in the clinic on 12/09/23 for worsening lower extremity edema.  Amlodipine  was stopped and was started on olmesartan  40mg  daily.  The lower extremity edema improved. He presented to the ER on 12/23/23 for fatigue, shortness of breath, lightheadedness, and intermittent chest pressure. These symptoms started on 12/20/23. In the ER the patient was in sinus bradycardia in the 30s-40s. He was admitted to cardiology for inpatient management.  Hospital Course     Sinus bradycardia The patient was placed on telemetry during hospitalization and was in sinus bradycardia with HR 40s-50s with no pauses or high grade AV block. It was unclear if his symptoms were related to bradycardia or perhaps another process as he also had a leukocytosis as outlined below. While walking in the hall the heart rate increased to 80, able to ambulate without cardiac limitation. The patient had a TTE on 12/24/2023 that showed an LVEF of 60 to 65%, mild dilated LV, borderline dilated ascending aorta, and trivial pericardial effusion.  Patient was placed on a live 14-day ZIO monitor prior to discharge which will also help assess chronotropic competence.If at follow up he is not significantly improved, Dr. Veryl Gottron discussed the possibility of treadmill stress test to evaluate for chronotropic incompetence   Chest discomfort, CAD HTN HLD Mild AKI vs CKD stage 2 Minimally elevated LFTs The chest discomfort was repeatable with palpation. Electrolytes and TSH were within normal limits. The patient's home olmesartan  40 mg was stopped because it was not available in the hospital and was placed on irbesartan 300 mg. He will go home on previous olmesartan  dose. His  hydrochlorothiazide  was decreased to 25mg  daily as a precaution. He did have mild AKI (may be mild CKD stage 2) during this admission so will go home on 25mg  daily. Also had minimal bump in AST/ALT. Recommend recheck CMET in follow-up. The patient's home atorvastatin , paroxitine, albuterol , levothyroxine , protonix , and aspirin  were continued  during the hospitalization. He does not have a blood pressure cuff at home and there are no hospital resources to assist with getting a blood pressure cuff.   Leukocytosis Upon admission, the patient's WBC was elevated. This further rose to 15.4 but patient denied fever, localized swelling or inflammation, and dysuria.  Ordered a UA, CRP, ESR, and procalcitonin.  All of these tests were negative.  Because of lack of symptoms and no localized source for the infection no further workup was planned. On 12/24/2023 the patient developed a mild headache.  Denied photophobia, phonophobia, vision changes, or any other neurological changes. He was treated with tylenol  650 mg with improvement. Dr. Veryl Gottron suspected a viral syndrome and instructed patient to go to the ER if symptoms suggestive of an infection develop. We have recommended close PCP follow-up to monitor this.  Dr. Veryl Gottron has seen and examined the patient and feels he is stable for discharge. Cardiology f/u has been arranged for 6/12 with [facility]. The patient also has an appointment in July with Dr. Lavonne Prairie which we will keep for now.  He had a previously scheduled outpatient echo which we cancelled since he had this done in the hospital. As above, also advised to f/u PCP within 1 week.      Did the patient have an acute coronary syndrome (MI, NSTEMI, STEMI, etc) this admission?:  No                               Did the patient have a percutaneous coronary intervention (stent / angioplasty)?:  No.          _____________  Discharge Vitals Blood pressure 129/68, pulse 60, temperature 98.2 F (36.8 C), temperature source Oral, resp. rate 16, height 5\' 10"  (1.778 m), weight 94.8 kg, SpO2 100%.  Filed Weights   12/23/23 2201 12/24/23 0452 12/25/23 0609  Weight: 96.4 kg 95.8 kg 94.8 kg    Labs & Radiologic Studies    CBC Recent Labs    12/24/23 0342 12/25/23 0242  WBC 15.2* 15.4*  NEUTROABS 11.7* 12.0*  HGB 15.8  15.3  HCT 45.6 44.1  MCV 89.1 90.6  PLT 261 238   Basic Metabolic Panel Recent Labs    16/10/96 0342 12/25/23 0242  NA 138 136  K 4.0 5.0  CL 103 100  CO2 27 27  GLUCOSE 150* 128*  BUN 22 27*  CREATININE 1.15 1.29*  CALCIUM  9.4 9.2  MG 2.3 2.5*   Liver Function Tests Recent Labs    12/24/23 0342 12/25/23 0242  AST 23 21  ALT 58* 47*  ALKPHOS 77 69  BILITOT 1.1 1.0  PROT 6.5 5.9*  ALBUMIN  3.7 3.5   No results for input(s): "LIPASE", "AMYLASE" in the last 72 hours. High Sensitivity Troponin:   No results for input(s): "TROPONINIHS" in the last 720 hours.  BNP Invalid input(s): "POCBNP" D-Dimer No results for input(s): "DDIMER" in the last 72 hours. Hemoglobin A1C No results for input(s): "HGBA1C" in the last 72 hours. Fasting Lipid Panel Recent Labs    12/23/23 2325  CHOL 142  HDL 46  LDLCALC 69  TRIG 133  CHOLHDL 3.1   Thyroid  Function Tests Recent Labs    12/23/23 1303  TSH 2.800   _____________  ECHOCARDIOGRAM COMPLETE Result Date: 12/24/2023    ECHOCARDIOGRAM REPORT   Patient Name:   Jeffrey Hooper Date of Exam: 12/24/2023 Medical Rec #:  [id number]       Height:       70.0 in Accession #:    [id number]      Weight:       211.2 lb Date of Birth:  [birth year]       BSA:          2.136 m Patient Age:    62 years        BP:           135/68 mmHg Patient Gender: M               HR:           49 bpm. Exam Location:  Inpatient Procedure: 2D Echo, Color Doppler and Cardiac Doppler (Both Spectral and Color            Flow Doppler were utilized during procedure). Indications:    R94.31 Abnormal EKG  History:        Patient has prior history of Echocardiogram examinations, most                 recent 11/25/2015. CAD; Risk Factors:Hypertension and                 Dyslipidemia.  Sonographer:    Sherline Distel Senior RDCS Referring Phys: [id number] Milbert Alice IMPRESSIONS  1. Left ventricular ejection fraction, by estimation, is 60 to 65%. The left ventricle has normal  function. The left ventricle has no regional wall motion abnormalities. There is mild concentric left ventricular hypertrophy. Left ventricular diastolic parameters were normal.  2. Right ventricular systolic function is normal. The right ventricular size is mildly enlarged. Tricuspid regurgitation signal is inadequate for assessing PA pressure.  3. Left atrial size was mildly dilated.  4. The mitral valve is normal in structure. Trivial mitral valve regurgitation.  5. The aortic valve is tricuspid. Aortic valve regurgitation is not visualized.  6. There is borderline dilatation of the ascending aorta, measuring 39 mm.  7. The inferior vena cava is normal in size with greater than 50% respiratory variability, suggesting right atrial pressure of 3 mmHg. Comparison(s): Prior images unable to be directly viewed, comparison made by report only. FINDINGS  Left Ventricle: Left ventricular ejection fraction, by estimation, is 60 to 65%. The left ventricle has normal function. The left ventricle has no regional wall motion abnormalities. The left ventricular internal cavity size was normal in size. There is  mild concentric left ventricular hypertrophy. Left ventricular diastolic parameters were normal. Right Ventricle: The right ventricular size is mildly enlarged. No increase in right ventricular wall thickness. Right ventricular systolic function is normal. Tricuspid regurgitation signal is inadequate for assessing PA pressure. Left Atrium: Left atrial size was mildly dilated. Right Atrium: Right atrial size was normal in size. Pericardium: Trivial pericardial effusion is present. Mitral Valve: The mitral valve is normal in structure. Trivial mitral valve regurgitation. Tricuspid Valve: The tricuspid valve is normal in structure. Tricuspid valve regurgitation is trivial. Aortic Valve: The aortic valve is tricuspid. Aortic valve regurgitation is not visualized. Pulmonic Valve: The pulmonic valve was normal in structure.  Pulmonic valve regurgitation is trivial. Aorta: The aortic root is normal in size and structure. Ascending aorta  measurements are within normal limits for age when indexed to body surface area. There is borderline dilatation of the ascending aorta, measuring 39 mm. Venous: The inferior vena cava is normal in size with greater than 50% respiratory variability, suggesting right atrial pressure of 3 mmHg. IAS/Shunts: The atrial septum is grossly normal.  LEFT VENTRICLE PLAX 2D LVIDd:         4.50 cm   Diastology LVIDs:         3.20 cm   LV e' medial:    8.49 cm/s LV PW:         1.10 cm   LV E/e' medial:  9.3 LV IVS:        1.20 cm   LV e' lateral:   10.90 cm/s LVOT diam:     2.30 cm   LV E/e' lateral: 7.2 LV SV:         125 LV SV Index:   58 LVOT Area:     4.15 cm  RIGHT VENTRICLE RV S prime:     14.10 cm/s TAPSE (M-mode): 3.1 cm LEFT ATRIUM             Index        RIGHT ATRIUM           Index LA diam:        4.00 cm 1.87 cm/m   RA Area:     19.00 cm LA Vol (A2C):   68.6 ml 32.11 ml/m  RA Volume:   55.20 ml  25.84 ml/m LA Vol (A4C):   65.8 ml 30.80 ml/m LA Biplane Vol: 69.3 ml 32.44 ml/m  AORTIC VALVE LVOT Vmax:   130.00 cm/s LVOT Vmean:  92.400 cm/s LVOT VTI:    0.300 m  AORTA Ao Root diam: 3.40 cm Ao Asc diam:  3.90 cm MITRAL VALVE MV Area (PHT): 3.07 cm    SHUNTS MV Decel Time: 247 msec    Systemic VTI:  0.30 m MV E velocity: 78.60 cm/s  Systemic Diam: 2.30 cm MV A velocity: 58.20 cm/s MV E/A ratio:  1.35 Mihai Croitoru MD Electronically signed by Luana Rumple MD Signature Date/Time: 12/24/2023/3:45:21 PM    Final    DG Chest Portable 1 View Result Date: 12/23/2023 CLINICAL DATA:  Shortness of breath. EXAM: PORTABLE CHEST 1 VIEW COMPARISON:  Chest CT dated 08/25/2022. FINDINGS: No focal consolidation, pleural effusion or pneumothorax. The cardiac silhouette is within limits. Coronary vascular stent. Midthoracic similar. No acute osseous pathology. IMPRESSION: No active disease. Electronically Signed    By: Angus Bark M.D.   On: 12/23/2023 14:34   Disposition   Pt is being discharged home today in good condition.  Follow-up Plans & Appointments     Follow-up Information     [name], NP Follow up.   Specialty: Cardiology Why: Cardiology follow up with Palmer Bobo NP on 01/16/24 at 10:55 am. Please arrive 15 minutes early. Contact information: [address] [place] [address] [phone]         Eliodoro Guerin, DO Follow up.   Specialty: Family Medicine Why: office will call back ,will need to work him in Contact information: [address] [place] [address] [phone]                Discharge Instructions     Diet - low sodium heart healthy   Complete by: As directed    Discharge instructions   Complete by: As directed    You had  a heart ultrasound scheduled in June - we took care of canceling this since you had it already done in the hospital. See follow-up section for appointments that are still scheduled.  Your labwork showed you were were slightly dehydrated in the hospital. We have decreased your dose of hydrochlorothiazide  to 25mg  daily. You should follow up with your primary care provider within 1 week to have recheck of your labs.  Patients with elevation in kidney function should be cautious with medicines like meloxicam . Please hold off on taking this and discuss with primary care provider in follow-up.  Your home medicine list included two medicines that both contain Tylenol  - acetaminophen  and hydrocodone /acetaminophen  (Norco/Vicodin). Just be aware of the content of acetaminophen  when taking these medicines as you should not take more than 1000mg  of acetaminophen  every 6 hours, and no more than 4000mg  total in a day.   Increase activity slowly   Complete by: As directed         Discharge Medications   Allergies as of 12/25/2023       Reactions   Alpha-gal Rash, Other (See Comments)   SEVERE ALLERGY   TO ANY MEAT OR MEAT DERIVED PRODUCTS FROM 4 LEGGED ANIMALS > > BEEF, PORK , GOATS, DEER, ETC. < < RESULT OF BITE FROM LONE STAR TICK   Beef (bovine) Protein Rash, Other (See Comments)   Beef-derived Drug Products Rash, Other (See Comments)   Lambs Quarters Rash, Other (See Comments)   Pork-derived Products Rash, Other (See Comments)   Prednisone  Other (See Comments)   im depo medrol  caused dizziness - pt passed out. Ask pt before giving. Pt can take oral prednisone     Brilinta  [ticagrelor ] Hives   Pt ate pork on the same day he took Brilinta  before knowing he had alpha gal. May have been the alpha gal reaction, pt is unsure.    Doxycycline Hives   Gabapentin  Swelling   Lyrica [pregabalin]    Dry mouth and felt raw   Methylprednisolone  Sodium Succ    im depo medrol  caused dizziness - pt passed out. Ask pt before giving. Issue occurred when administered in the left arm    Penicillins Hives   Shellfish Allergy  Hives   Adhesive [tape] Rash   Fentanyl  Rash   Reaction to adhesive, not the drug         Medication List     PAUSE taking these medications    meloxicam  7.5 MG tablet Wait to take this until your doctor or other care provider tells you to start again. Commonly known as: MOBIC  Take 7.5 mg by mouth at bedtime.       TAKE these medications    acetaminophen  500 MG tablet Commonly known as: TYLENOL  Take 1,000 mg by mouth daily as needed for mild pain or headache.   albuterol  108 (90 Base) MCG/ACT inhaler Commonly known as: Ventolin  HFA TAKE 2 PUFFS BY MOUTH EVERY 6 HOURS AS NEEDED FOR WHEEZE OR SHORTNESS OF BREATH   aspirin  EC 81 MG tablet Take 81 mg by mouth at bedtime.   atorvastatin  80 MG tablet Commonly known as: LIPITOR  TAKE 1 TABLET BY MOUTH EVERYDAY AT BEDTIME   B-12 Compliance Injection 1000 MCG/ML Kit Generic drug: Cyanocobalamin  Inject 1,000 mcg as directed every 30 (thirty) days.   betamethasone  dipropionate 0.05 % cream Apply 1 Application  topically 2 (two) times daily as needed (swelling/ itching).   cetirizine  10 MG tablet Commonly known as: ZYRTEC  Take 10 mg by mouth daily.   EPINEPHrine   0.3 mg/0.3 mL Soaj injection Commonly known as: EPI-PEN Inject 0.3 mg into the muscle as needed for anaphylaxis.   fluticasone  50 MCG/ACT nasal spray Commonly known as: FLONASE  Place 2 sprays into both nostrils daily.   hydrochlorothiazide  25 MG tablet Commonly known as: HYDRODIURIL  Take 1 tablet (25 mg total) by mouth daily. What changed:  medication strength how much to take   HYDROcodone -acetaminophen  5-325 MG tablet Commonly known as: NORCO/VICODIN Take 1 tablet by mouth every 6 (six) hours as needed for moderate pain (pain score 4-6).   levothyroxine  100 MCG tablet Commonly known as: SYNTHROID  Take 1 tablet (100 mcg total) by mouth daily.   nitroGLYCERIN  0.4 MG SL tablet Commonly known as: NITROSTAT  PLACE 1 TABLET UNDER THE TONGUE EVERY 5 MINUTES AS NEEDED FOR CHEST PAIN.   olmesartan  40 MG tablet Commonly known as: BENICAR  Take 1 tablet (40 mg total) by mouth daily.   ondansetron  4 MG disintegrating tablet Commonly known as: ZOFRAN -ODT TAKE 1 TABLET BY MOUTH EVERY 8 HOURS AS NEEDED FOR NAUSEA AND VOMITING   pantoprazole  40 MG tablet Commonly known as: PROTONIX  Take 1 tablet (40 mg total) by mouth 2 (two) times daily.   PARoxetine  20 MG tablet Commonly known as: PAXIL  Take 1 tablet (20 mg total) by mouth daily. What changed: when to take this   promethazine  25 MG tablet Commonly known as: PHENERGAN  TAKE 0.5-1 TABLETS (12.5-25 MG TOTAL) BY MOUTH 2 (TWO) TIMES DAILY AS NEEDED FOR NAUSEA OR VOMITING.           Outstanding Labs/Studies    Duration of Discharge Encounter: APP Time: 20 minutes   Signed, Melita Springer, PA-C 12/25/2023, 4:14 PM

## 2023-12-25 NOTE — Telephone Encounter (Signed)
 Spoke with pt , got him a apt with us 

## 2023-12-25 NOTE — TOC Transition Note (Addendum)
 Transition of Care [facility]) - Discharge Note   Patient Details  Name: Jeffrey Hooper MRN: [id number] Date of Birth: 1961/06/16  [facility]) CM/SW Contact:  Jennett Model, RN Phone Number: 12/25/2023, 3:11 PM   Clinical Narrative:    For possible dc today, will go home with heart monitor.  Wife will transport him home.  Patient will need to call PCP office to see what date they can work him in for follow up.  Staff RN will let inform him of this while going over dc instructions.         Patient Goals and CMS Choice            Discharge Placement                       Discharge Plan and Services Additional resources added to the After Visit Summary for                                       Social Drivers of Health (SDOH) Interventions SDOH Screenings   Food Insecurity: No Food Insecurity (12/23/2023)  Housing: Low Risk  (12/23/2023)  Transportation Needs: No Transportation Needs (12/23/2023)  Utilities: Not At Risk (12/23/2023)  Alcohol Screen: Low Risk  (08/30/2023)  Depression (PHQ2-9): Low Risk  (08/30/2023)  Financial Resource Strain: Low Risk  (08/30/2023)  Physical Activity: Sufficiently Active (08/30/2023)  Social Connections: Patient Declined (12/23/2023)  Stress: No Stress Concern Present (08/30/2023)  Tobacco Use: Medium Risk (12/23/2023)  Health Literacy: Adequate Health Literacy (08/30/2023)     Readmission Risk Interventions    12/25/2023    3:06 PM  Readmission Risk Prevention Plan  Post Dischage Appt Complete  Medication Screening Complete  Transportation Screening Complete

## 2023-12-25 NOTE — TOC CM/SW Note (Signed)
 Transition of Care [facility]) - Inpatient Brief Assessment   Patient Details  Name: SPENCE SOBERANO MRN: [id number] Date of Birth: 1960/12/20  Transition of Care [facility]) CM/SW Contact:    Jennett Model, RN Phone Number: 12/25/2023, 3:08 PM   Clinical Narrative: From home with spouse, has PCP and insurance on file, states has no HH services in place at this time, has a cane at  home.  States family member will transport them home at [employer] and family is support system, states gets medications from [facility] in [place].  Pta self ambulatory.    Transition of Care Asessment: Insurance and Status: Insurance coverage has been reviewed Patient has primary care physician: Yes Home environment has been reviewed: home with wife Prior level of function:: indep Prior/Current Home Services: Current home services (cane) Social Drivers of Health Review: SDOH reviewed no interventions necessary Readmission risk has been reviewed: Yes Transition of care needs: no transition of care needs at this time

## 2023-12-25 NOTE — Plan of Care (Signed)

## 2023-12-25 NOTE — Progress Notes (Addendum)
 Patient Name: Jeffrey Hooper Date of Encounter: 12/25/2023 [facility] Cardiologist: Eilleen Grates, MD   Interval Summary  .    Reported having some chills yesterday evening for about 8 minutes. Has continued to have fatigue but denies recent lightheadedness.  Vital Signs .    Vitals:   12/24/23 1720 12/24/23 2104 12/25/23 0256 12/25/23 0609  BP: 126/64 117/69 (!) 148/68 119/66  Pulse: (!) 52 (!) 48 (!) 45 (!) 47  Resp: 18 16 18 18   Temp:  97.9 F (36.6 C) 98.2 F (36.8 C) 98.1 F (36.7 C)  TempSrc:  Oral Oral Oral  SpO2: 97% 99% 99% 98%  Weight:    94.8 kg  Height:        Intake/Output Summary (Last 24 hours) at 12/25/2023 0740 Last data filed at 12/25/2023 0257 Gross per 24 hour  Intake 480 ml  Output 1 ml  Net 479 ml      12/25/2023    6:09 AM 12/24/2023    4:52 AM 12/23/2023   10:01 PM  Last 3 Weights  Weight (lbs) 209 lb 211 lb 3.2 oz 212 lb 8.4 oz  Weight (kg) 94.802 kg 95.8 kg 96.4 kg      Telemetry/ECG    Normal sinus rhythm in 40's to 60's - Personally Reviewed  Physical Exam .   GEN: No acute distress.   Neck: No JVD Cardiac: RRR, no murmurs, rubs, or gallops.  Respiratory: Clear to auscultation bilaterally. GI: Soft, nontender, non-distended. Back non tender to palpation MS: No edema  Assessment & Plan .     Jeffrey Hooper is a 63 y.o. male with a hx of CAD s/p DES to mid-LAD (12/02/15), HTN, HLD, hiatial hernia, history of backpain with multiple lumbar sacral fusions, who was seen on 12/24/2023 for the evaluation of bradycardia.    Sinus bradycardia  CAD s/p DES to mid-LAD 12/02/15 HLD HTN Patient switched from amlodipine  10mg  every day to olmesartan  40mg  2 weeks prior to developing bradycardia to the 30-40s with associated lightheadedness, fatigue, dyspnea, chest pressure. Lower extremity edema improved after stopping amlodipine . He also notes chronic palpitations, without clear evidence on prior ziopatch. It is unlikely his  medication changes is causing his symptoms, Telemetry shows bradycardia but no pauses. High sensitivity troponin's negative, EKG showed sinus bradycardia with no changes suggestive of ischemia.  -- TTE on 12/24/23 showed LVEF 60-65%, No RWMA, mild concentric LVH, mildly enlarged RV, mildly dilated LV, borderline dilation of ascending aorta, and trivial pericardial effusion. -- stopped home olmesartan  40mg  because not available in hospital -- Continue irbesartan 300mg  daily -- decrease home hydrochlorothiazide  to 25mg  every day (home: 50mg  every day) -- Avoid AV nodal agents due to bradycardia. -- continue atorvastatin  80mg  every day. LDL 69. At GOAL. -- continue asa 81mg  every day -- Plan for outpatient cardiac monitor. -- Maintain telemetry, Goal K~4, Mag~2.   Leucocytosis (15.4 on 12/25/23) -- denies fever, had a episode of chills, dysuria. -- Ordered UA, CRP, ESR, Procalcitonin.  history of backpain with multiple lumbar and sacral fusion -- had infection in 1992 -- has spinal cord stimulator that is not MRI compatible -- denies any recent worsening of back pain or hip pain.  Slight AKI  -- Creatinine 1.29 today up from 1.15 yesterday will continue to monitor -- appears to have some degree of mild CKD    Headache -- started on 12/24/23 -- may take PRN tylenol . -- No photophobia, phonophobia, denies other neural symptoms.  Hypothyroidism -- TSH normal -- continue home levothyroxine  100mcg every day   GERD  -- continue home ppi bid   Asthma  -- continue home albuterol  q4h prn    Anxiety/depression  -- continue home paroxetine  20mg  every day  For questions or updates, please contact [facility] Please consult [email or web address] for contact info under        Signed, Grabiela Wohlford, PA-C

## 2023-12-26 ENCOUNTER — Telehealth: Payer: Self-pay

## 2023-12-26 NOTE — Transitions of Care (Post Inpatient/ED Visit) (Signed)
 12/26/2023  Name: Jeffrey Hooper MRN: [id number] DOB: Dec 01, 1960  Today's TOC FU Call Status: Today's TOC FU Call Status:: Successful TOC FU Call Completed TOC FU Call Complete Date: 12/26/23 Patient's Name and Date of Birth confirmed.  Transition Care Management Follow-up Telephone Call Date of Discharge: 12/25/23 Discharge Facility: [facility]) Type of Discharge: Inpatient Admission Primary Inpatient Discharge Diagnosis:: dizziness How have you been since you were released from the hospital?: Better Any questions or concerns?: No  Items Reviewed: Did you receive and understand the discharge instructions provided?: Yes Medications obtained,verified, and reconciled?: Yes (Medications Reviewed) Any new allergies since your discharge?: No Dietary orders reviewed?: Yes Do you have support at home?: Yes People in Home [RPT]: spouse  Medications Reviewed Today: Medications Reviewed Today     Reviewed by Darrall Ellison, LPN (Licensed Practical Nurse) on 12/26/23 at 1442  Med List Status: <None>   Medication Order Taking? Sig Documenting Provider Last Dose Status Informant  acetaminophen  (TYLENOL ) 500 MG tablet [id number] No Take 1,000 mg by mouth daily as needed for mild pain or headache.  [provider] Past Week Active Self, Pharmacy Records  albuterol  (VENTOLIN  HFA) 108 [phone] Base) MCG/ACT inhaler [id number] No TAKE 2 PUFFS BY MOUTH EVERY 6 HOURS AS NEEDED FOR WHEEZE OR SHORTNESS OF BREATH [name], DO Unknown Active Self, Pharmacy Records  aspirin  EC 81 MG tablet [id number] No Take 81 mg by mouth at bedtime. [provider] Past Week Active Self, Pharmacy Records  atorvastatin  (LIPITOR ) 80 MG tablet [id number] No TAKE 1 TABLET BY MOUTH EVERYDAY AT BEDTIME [name], DO Past Week Active Self, Pharmacy Records  betamethasone  dipropionate 0.05 % cream [id number] No Apply 1 Application topically 2 (two) times daily as needed (swelling/ itching).  [provider] Past Month Active Self, Pharmacy Records  cetirizine  (ZYRTEC ) 10 MG tablet [id number] No Take 10 mg by mouth daily. [provider] 12/23/2023 Active Self, Pharmacy Records  Cyanocobalamin  (B-12 COMPLIANCE INJECTION) 1000 MCG/ML KIT [id number] No Inject 1,000 mcg as directed every 30 (thirty) days. [provider] Past Month Active Self, Pharmacy Records  EPINEPHrine  0.3 mg/0.3 mL IJ SOAJ injection [id number] No Inject 0.3 mg into the muscle as needed for anaphylaxis. Jeffrey Guerin, DO Unknown Active Self, Pharmacy Records  fluticasone  (FLONASE ) 50 MCG/ACT nasal spray [id number] No Place 2 sprays into both nostrils daily. [name], DO 12/23/2023 Active Self, Pharmacy Records  hydrochlorothiazide  (HYDRODIURIL ) 25 MG tablet [id number]  Take 1 tablet (25 mg total) by mouth daily. Adams, Zane, PA-C  Active   HYDROcodone -acetaminophen  (NORCO/VICODIN) 5-325 MG tablet [id number] No Take 1 tablet by mouth every 6 (six) hours as needed for moderate pain (pain score 4-6). [provider] Past Week Active Self, Pharmacy Records  levothyroxine  (SYNTHROID ) 100 MCG tablet [id number] No Take 1 tablet (100 mcg total) by mouth daily. [name], DO 12/23/2023 Active Self, Pharmacy Records  meloxicam  (MOBIC ) 7.5 MG tablet [id number] No Take 7.5 mg by mouth at bedtime. [provider] Past Week Active Self, Pharmacy Records           Med Note Jeffrey Hooper   Tue Dec 24, 2023  8:10 AM) Confirmed just once daily.   nitroGLYCERIN  (NITROSTAT ) 0.4 MG SL tablet [id number] No PLACE 1 TABLET UNDER THE TONGUE EVERY 5 MINUTES AS NEEDED FOR CHEST PAIN. Jeffrey Guerin, DO Unknown Active Self, Pharmacy Records  olmesartan  (BENICAR ) 40 MG tablet [id number] No Take 1 tablet (40 mg total) by  mouth daily. Jeffrey Boatman, NP 12/23/2023 Active Self, Pharmacy Records  ondansetron  (ZOFRAN -ODT) 4 MG disintegrating tablet [id number] No TAKE 1 TABLET BY MOUTH  EVERY 8 HOURS AS NEEDED FOR NAUSEA AND VOMITING Jeffrey Guerin, DO Unknown Active Self, Pharmacy Records  pantoprazole  (PROTONIX ) 40 MG tablet [id number] No Take 1 tablet (40 mg total) by mouth 2 (two) times daily. [name], DO 12/23/2023 Active Self, Pharmacy Records  PARoxetine  (PAXIL ) 20 MG tablet [id number] No Take 1 tablet (20 mg total) by mouth daily.  Patient taking differently: Take 20 mg by mouth at bedtime.   Jeffrey Guerin, DO Past Week Active Self, Pharmacy Records  promethazine  (PHENERGAN ) 25 MG tablet [id number] No TAKE 0.5-1 TABLETS (12.5-25 MG TOTAL) BY MOUTH 2 (TWO) TIMES DAILY AS NEEDED FOR NAUSEA OR VOMITING. Jeffrey Guerin, DO Unknown Active Self, Pharmacy Records            Home Care and Equipment/Supplies: Were Home Health Services Ordered?: NA Any new equipment or medical supplies ordered?: NA  Functional Questionnaire: Do you need assistance with bathing/showering or dressing?: No Do you need assistance with meal preparation?: No Do you need assistance with eating?: No Do you have difficulty maintaining continence: No Do you need assistance with getting out of bed/getting out of a chair/moving?: No Do you have difficulty managing or taking your medications?: No  Follow up appointments reviewed: PCP Follow-up appointment confirmed?: Yes Date of PCP follow-up appointment?: 01/07/24 Follow-up Provider: [facility] Follow-up appointment confirmed?: NA Do you need transportation to your follow-up appointment?: No Do you understand care options if your condition(s) worsen?: Yes-patient verbalized understanding    SIGNATURE Darrall Ellison, LPN [facility] Nurse Health Advisor Direct Dial [phone]

## 2023-12-31 ENCOUNTER — Ambulatory Visit (INDEPENDENT_AMBULATORY_CARE_PROVIDER_SITE_OTHER)

## 2023-12-31 DIAGNOSIS — E538 Deficiency of other specified B group vitamins: Secondary | ICD-10-CM

## 2023-12-31 MED ORDER — CYANOCOBALAMIN 1000 MCG/ML IJ SOLN
1000.0000 ug | INTRAMUSCULAR | Status: DC
Start: 2023-12-31 — End: 2024-02-26
  Administered 2023-12-31 – 2024-02-03 (×2): 1000 ug via INTRAMUSCULAR

## 2023-12-31 NOTE — Progress Notes (Signed)
 Patient is in office today for a nurse visit for B12 Injection. Patient Injection was given in the  Left deltoid. Patient tolerated injection well.

## 2024-01-07 ENCOUNTER — Ambulatory Visit (INDEPENDENT_AMBULATORY_CARE_PROVIDER_SITE_OTHER): Admitting: Family Medicine

## 2024-01-07 ENCOUNTER — Encounter: Payer: Self-pay | Admitting: Family Medicine

## 2024-01-07 VITALS — BP 119/72 | HR 61 | Temp 98.5°F | Ht 70.0 in | Wt 215.0 lb

## 2024-01-07 DIAGNOSIS — D72829 Elevated white blood cell count, unspecified: Secondary | ICD-10-CM

## 2024-01-07 DIAGNOSIS — N179 Acute kidney failure, unspecified: Secondary | ICD-10-CM | POA: Diagnosis not present

## 2024-01-07 DIAGNOSIS — R748 Abnormal levels of other serum enzymes: Secondary | ICD-10-CM | POA: Diagnosis not present

## 2024-01-07 DIAGNOSIS — Z09 Encounter for follow-up examination after completed treatment for conditions other than malignant neoplasm: Secondary | ICD-10-CM | POA: Diagnosis not present

## 2024-01-07 DIAGNOSIS — R001 Bradycardia, unspecified: Secondary | ICD-10-CM

## 2024-01-07 DIAGNOSIS — R160 Hepatomegaly, not elsewhere classified: Secondary | ICD-10-CM

## 2024-01-07 NOTE — Progress Notes (Signed)
 Subjective: Jeffrey Hooper PCP: Jeffrey Guerin, DO JXB:JYNWG [name] is a 63 y.o. male presenting to clinic today for:  1. Bradycardia/ abnormal labs Presented with dizziness.  Found to have symptomatic bradycardia of unknown etiology.  Recommended to consider stress test outpatient with cardiology. Appointment with cardiology scheduled 01/16/24.  He has persistent shortness of breath.  They discussed possible pacemaker but given abnormal labs, they didn't feel comfortable proceeding with anything yet.  He thought he saw a lump on his LUQ abdomen when he laid flat.  Worried it might be a hernia. No pain. No jaundice. No nausea/ vomiting/ hepatitis exposure that he knows of.  Leg edema better after off Norvasc . Tolerating reduced dose of hydrochlorothiazide  and ARB.   ROS: Per HPI  Allergies  Allergen Reactions   Alpha-Gal Rash and Other (See Comments)    SEVERE ALLERGY  TO ANY MEAT OR MEAT DERIVED PRODUCTS FROM 4 LEGGED ANIMALS > > BEEF, PORK , GOATS, DEER, ETC. < < RESULT OF BITE FROM LONE STAR TICK   Beef (Bovine) Protein Rash and Other (See Comments)   Beef-Derived Drug Products Rash and Other (See Comments)   Lambs Quarters Rash and Other (See Comments)   Pork-Derived Products Rash and Other (See Comments)   Prednisone  Other (See Comments)    im depo medrol  caused dizziness - pt passed out. Ask pt before giving. Pt can take oral prednisone     Brilinta  [Ticagrelor ] Hives    Pt ate pork on the same day he took Brilinta  before knowing he had alpha gal. May have been the alpha gal reaction, pt is unsure.    Doxycycline Hives   Gabapentin  Swelling   Lyrica [Pregabalin]     Dry mouth and felt raw   Methylprednisolone  Sodium Succ     im depo medrol  caused dizziness - pt passed out. Ask pt before giving. Issue occurred when administered in the left arm    Penicillins Hives   Shellfish Allergy  Hives   Adhesive [Tape] Rash   Fentanyl  Rash    Reaction to adhesive, not the drug     Past Medical History:  Diagnosis Date   Allergy     Allergy  to alpha-gal    Anxiety    Arthritis    CAD (coronary artery disease), native coronary artery 12/03/2015   Coronary artery disease    Dysrhythmia    "irregular and PAC'S"   GERD (gastroesophageal reflux disease)    Head injury with loss of consciousness (HCC)    back in the 1980's   History of kidney stones    Hyperlipidemia    Hypertension    Hypothyroid    Multiple benign nevi    Neuromuscular disorder (HCC) 2011   LEFT RADIAL NERVE SURGERY R/T TRAUMA   Pneumonia 2014, 2017   Prostatism    Pulmonary nodule    Spinal cord stimulator status    told to bring remote to surgery    Current Outpatient Medications:    acetaminophen  (TYLENOL ) 500 MG tablet, Take 1,000 mg by mouth daily as needed for mild pain or headache. , Disp: , Rfl:    albuterol  (VENTOLIN  HFA) 108 (90 Base) MCG/ACT inhaler, TAKE 2 PUFFS BY MOUTH EVERY 6 HOURS AS NEEDED FOR WHEEZE OR SHORTNESS OF BREATH, Disp: 18 each, Rfl: 1   aspirin  EC 81 MG tablet, Take 81 mg by mouth at bedtime., Disp: , Rfl:    atorvastatin  (LIPITOR ) 80 MG tablet, TAKE 1 TABLET BY MOUTH EVERYDAY AT BEDTIME, Disp: 90 tablet, Rfl:  3   betamethasone  dipropionate 0.05 % cream, Apply 1 Application topically 2 (two) times daily as needed (swelling/ itching)., Disp: , Rfl:    cetirizine  (ZYRTEC ) 10 MG tablet, Take 10 mg by mouth daily., Disp: , Rfl:    Cyanocobalamin  (B-12 COMPLIANCE INJECTION) 1000 MCG/ML KIT, Inject 1,000 mcg as directed every 30 (thirty) days., Disp: , Rfl:    EPINEPHrine  0.3 mg/0.3 mL IJ SOAJ injection, Inject 0.3 mg into the muscle as needed for anaphylaxis., Disp: 2 each, Rfl: 3   fluticasone  (FLONASE ) 50 MCG/ACT nasal spray, Place 2 sprays into both nostrils daily., Disp: 48 g, Rfl: 1   hydrochlorothiazide  (HYDRODIURIL ) 25 MG tablet, Take 1 tablet (25 mg total) by mouth daily., Disp: 30 tablet, Rfl: 2   HYDROcodone -acetaminophen  (NORCO/VICODIN) 5-325 MG tablet,  Take 1 tablet by mouth every 6 (six) hours as needed for moderate pain (pain score 4-6)., Disp: , Rfl:    levothyroxine  (SYNTHROID ) 100 MCG tablet, Take 1 tablet (100 mcg total) by mouth daily., Disp: 90 tablet, Rfl: 3   [Paused] meloxicam  (MOBIC ) 7.5 MG tablet, Take 7.5 mg by mouth at bedtime., Disp: , Rfl:    nitroGLYCERIN  (NITROSTAT ) 0.4 MG SL tablet, PLACE 1 TABLET UNDER THE TONGUE EVERY 5 MINUTES AS NEEDED FOR CHEST PAIN., Disp: 25 tablet, Rfl: 2   olmesartan  (BENICAR ) 40 MG tablet, Take 1 tablet (40 mg total) by mouth daily., Disp: 90 tablet, Rfl: 2   ondansetron  (ZOFRAN -ODT) 4 MG disintegrating tablet, TAKE 1 TABLET BY MOUTH EVERY 8 HOURS AS NEEDED FOR NAUSEA AND VOMITING, Disp: 20 tablet, Rfl: 0   pantoprazole  (PROTONIX ) 40 MG tablet, Take 1 tablet (40 mg total) by mouth 2 (two) times daily., Disp: 180 tablet, Rfl: 3   PARoxetine  (PAXIL ) 20 MG tablet, Take 1 tablet (20 mg total) by mouth daily. (Patient taking differently: Take 20 mg by mouth at bedtime.), Disp: 90 tablet, Rfl: 3   promethazine  (PHENERGAN ) 25 MG tablet, TAKE 0.5-1 TABLETS (12.5-25 MG TOTAL) BY MOUTH 2 (TWO) TIMES DAILY AS NEEDED FOR NAUSEA OR VOMITING., Disp: 45 tablet, Rfl: 1  Current Facility-Administered Medications:    cyanocobalamin  (VITAMIN B12) injection 1,000 mcg, 1,000 mcg, Intramuscular, Q30 days, [name], DO, 1,000 mcg at 12/31/23 1610 Social History   Socioeconomic History   Marital status: Married    Spouse name: Not on file   Number of children: 3   Years of education: Not on file   Highest education level: Not on file  Occupational History   Occupation: [employer]  Tobacco Use   Smoking status: Former    Current packs/day: 0.00    Average packs/day: 1 pack/day for 20.0 years (20.0 ttl pk-yrs)    Types: Cigarettes    Start date: 11/28/1986    Quit date: 11/28/2006    Years since quitting: 17.1   Smokeless tobacco: Never  Vaping Use   Vaping status: Never Used   Substance and Sexual Activity   Alcohol use: Not Currently    Comment: rarely   Drug use: No   Sexual activity: Not Currently  Other Topics Concern   Not on file  Social History Narrative   Lives at home with wife, not working disabled from the city of [place]   They have an adopted son at home (age 66  2022)   Former smoker no alcohol tobacco or drug use now   Social Drivers of Corporate investment banker Strain: Low Risk  (08/30/2023)   Overall  Financial Resource Strain (CARDIA)    Difficulty of Paying Living Expenses: Not very hard  Food Insecurity: No Food Insecurity (12/23/2023)   Hunger Vital Sign    Worried About Running Out of Food in the Last Year: Never true    Ran Out of Food in the Last Year: Never true  Transportation Needs: No Transportation Needs (12/23/2023)   PRAPARE - Administrator, Civil Service (Medical): No    Lack of Transportation (Non-Medical): No  Physical Activity: Sufficiently Active (08/30/2023)   Exercise Vital Sign    Days of Exercise per Week: 5 days    Minutes of Exercise per Session: 30 min  Stress: No Stress Concern Present (08/30/2023)   [employer] of Occupational Health - Occupational Stress Questionnaire    Feeling of Stress : Not at all  Social Connections: Patient Declined (12/23/2023)   Social Connection and Isolation Panel [NHANES]    Frequency of Communication with Friends and Family: Patient declined    Frequency of Social Gatherings with Friends and Family: Patient declined    Attends Religious Services: Patient declined    Database administrator or Organizations: Patient declined    Attends Banker Meetings: Patient declined    Marital Status: Patient declined  Intimate Partner Violence: Not At Risk (12/23/2023)   Humiliation, Afraid, Rape, and Kick questionnaire    Fear of Current or Ex-Partner: No    Emotionally Abused: No    Physically Abused: No    Sexually Abused: No   Family History  Problem  Relation Age of Onset   Heart failure Mother 29       Died age 69   Congestive Heart Failure Mother    Heart attack Father 23       Died with MI   Heart attack Paternal Grandmother    Heart attack Paternal Grandfather    Colon cancer Neg Hx     Objective: Office vital signs reviewed. BP 119/72   Pulse 61   Temp 98.5 F (36.9 C)   Ht 5\' 10"  (1.778 m)   Wt 215 lb (97.5 kg)   SpO2 97%   BMI 30.85 kg/m   Physical Examination:  General: Awake, alert, well nourished, No acute distress HEENT: sclera white, no jaundice Cardio: bradycardic with regular rhythm, S1S2 heard, no murmurs appreciated Pulm: clear to auscultation bilaterally, no wheezes, rhonchi or rales; normal work of breathing on room air GI: soft, non-tender, non-distended, bowel sounds present x4, liver feels enlarged. No splenomegaly. No appreciable abdominal masses.  He has diastasis recti present    Assessment/ Plan: 63 y.o. male   Symptomatic bradycardia  Hospital discharge follow-up  Elevated liver enzymes - Plan: CMP14+EGFR, US  Abdomen Limited RUQ (LIVER/GB), Acute Hep Panel & Hep B Surface Ab  AKI (acute kidney injury) (HCC) - Plan: CMP14+EGFR  Leukocytosis, unspecified type - Plan: CBC with Differential, US  Abdomen Limited RUQ (LIVER/GB), Acute Hep Panel & Hep B Surface Ab  Enlarged liver - Plan: US  Abdomen Limited RUQ (LIVER/GB), Acute Hep Panel & Hep B Surface Ab  Mildly bradycardic on exam.  Agree with consideration for pacemaker given HR 30s at home.  Check hepatitis panel, liver felt slightly large on exam.  RUQ US  STAT. Repeat LFTs, renal function and CBC.  If CBC persistently elevated, plan for referral to hematology.  I reviewed his CT lung scan and CT abdominal scans from 2024. Neither showed malignant processes or significant herniation (only a small fat containing hernia noted, which  I would not expect to cause any of his symptoms).  Suspect SOB likely secondary to bradycardia  No orders of  the defined types were placed in this encounter.  No orders of the defined types were placed in this encounter.   Today's visit is for Transitional Care Management.  The patient was discharged from [facility] on 12/25/2023 with a primary diagnosis of symptomatic bradycardia.   Contact with the patient and/or caregiver, by a clinical staff member, was made on 12/26/23 and was documented as a telephone encounter within the EMR.  Through chart review and discussion with the patient I have determined that management of their condition is of high complexity.    Jeffrey Guerin, DO [facility] [phone]

## 2024-01-07 NOTE — Patient Instructions (Addendum)
 I've ordered an ultrasound of your liver as well as repeat liver function tests and repeating your white blood cell count I've also ordered hepatitis testing.  Your liver felt a little larger than expected today but this may be transient so I just want to rule out any abnormalities.  You should receive an appointment at [facility] for tomorrow.

## 2024-01-08 ENCOUNTER — Ambulatory Visit (HOSPITAL_COMMUNITY)
Admission: RE | Admit: 2024-01-08 | Discharge: 2024-01-08 | Disposition: A | Discharge status: Home / Self Care | Source: Ambulatory Visit | Attending: Family Medicine | Admitting: Family Medicine

## 2024-01-08 ENCOUNTER — Ambulatory Visit: Payer: Self-pay | Admitting: Family Medicine

## 2024-01-08 DIAGNOSIS — D72829 Elevated white blood cell count, unspecified: Secondary | ICD-10-CM | POA: Insufficient documentation

## 2024-01-08 DIAGNOSIS — R160 Hepatomegaly, not elsewhere classified: Secondary | ICD-10-CM | POA: Diagnosis present

## 2024-01-08 DIAGNOSIS — R748 Abnormal levels of other serum enzymes: Secondary | ICD-10-CM | POA: Insufficient documentation

## 2024-01-08 LAB — CBC WITH DIFFERENTIAL/PLATELET
Basophils Absolute: 0.1 10*3/uL (ref 0.0–0.2)
Basos: 1 %
EOS (ABSOLUTE): 0.3 10*3/uL (ref 0.0–0.4)
Eos: 3 %
Hematocrit: 43 % (ref 37.5–51.0)
Hemoglobin: 14.5 g/dL (ref 13.0–17.7)
Immature Grans (Abs): 0 10*3/uL (ref 0.0–0.1)
Immature Granulocytes: 0 %
Lymphocytes Absolute: 1.8 10*3/uL (ref 0.7–3.1)
Lymphs: 18 %
MCH: 31.8 pg (ref 26.6–33.0)
MCHC: 33.7 g/dL (ref 31.5–35.7)
MCV: 94 fL (ref 79–97)
Monocytes Absolute: 0.8 10*3/uL (ref 0.1–0.9)
Monocytes: 9 %
Neutrophils Absolute: 6.7 10*3/uL (ref 1.4–7.0)
Neutrophils: 69 %
Platelets: 237 10*3/uL (ref 150–450)
RBC: 4.56 x10E6/uL (ref 4.14–5.80)
RDW: 13.3 % (ref 11.6–15.4)
WBC: 9.6 10*3/uL (ref 3.4–10.8)

## 2024-01-08 LAB — CMP14+EGFR
ALT: 46 IU/L — ABNORMAL HIGH (ref 0–44)
AST: 24 IU/L (ref 0–40)
Albumin: 4.1 g/dL (ref 3.9–4.9)
Alkaline Phosphatase: 95 IU/L (ref 44–121)
BUN/Creatinine Ratio: 14 (ref 10–24)
BUN: 17 mg/dL (ref 8–27)
Bilirubin Total: 0.8 mg/dL (ref 0.0–1.2)
CO2: 25 mmol/L (ref 20–29)
Calcium: 9.3 mg/dL (ref 8.6–10.2)
Chloride: 99 mmol/L (ref 96–106)
Creatinine, Ser: 1.2 mg/dL (ref 0.76–1.27)
Globulin, Total: 1.9 g/dL (ref 1.5–4.5)
Glucose: 93 mg/dL (ref 70–99)
Potassium: 4.7 mmol/L (ref 3.5–5.2)
Sodium: 139 mmol/L (ref 134–144)
Total Protein: 6 g/dL (ref 6.0–8.5)
eGFR: 68 mL/min/{1.73_m2} (ref >59)

## 2024-01-08 LAB — ACUTE HEP PANEL AND HEP B SURFACE AB
Hep A IgM: NEGATIVE
Hep B C IgM: NEGATIVE
Hep C Virus Ab: NONREACTIVE
Hepatitis B Surf Ab Quant: 47.1 m[IU]/mL
Hepatitis B Surface Ag: NEGATIVE

## 2024-01-16 ENCOUNTER — Encounter: Payer: Self-pay | Admitting: Emergency Medicine

## 2024-01-16 ENCOUNTER — Ambulatory Visit: Attending: Emergency Medicine | Admitting: Emergency Medicine

## 2024-01-16 VITALS — BP 124/56 | HR 67 | Ht 70.5 in | Wt 214.0 lb

## 2024-01-16 DIAGNOSIS — I2510 Atherosclerotic heart disease of native coronary artery without angina pectoris: Secondary | ICD-10-CM | POA: Diagnosis present

## 2024-01-16 DIAGNOSIS — R42 Dizziness and giddiness: Secondary | ICD-10-CM | POA: Diagnosis present

## 2024-01-16 DIAGNOSIS — E785 Hyperlipidemia, unspecified: Secondary | ICD-10-CM | POA: Diagnosis present

## 2024-01-16 DIAGNOSIS — Z9861 Coronary angioplasty status: Secondary | ICD-10-CM | POA: Diagnosis present

## 2024-01-16 DIAGNOSIS — R002 Palpitations: Secondary | ICD-10-CM | POA: Diagnosis present

## 2024-01-16 DIAGNOSIS — R600 Localized edema: Secondary | ICD-10-CM | POA: Diagnosis present

## 2024-01-16 DIAGNOSIS — I10 Essential (primary) hypertension: Secondary | ICD-10-CM | POA: Diagnosis not present

## 2024-01-16 DIAGNOSIS — R001 Bradycardia, unspecified: Secondary | ICD-10-CM | POA: Diagnosis present

## 2024-01-16 NOTE — Patient Instructions (Signed)
 Medication Instructions:  NO CHANGES   Lab Work: NONE   Testing/Procedures: Your physician has requested that you have an Exercise Stress Test. An exercise tolerance test is a test to check how your heart works during exercise. You will need to walk on a treadmill for this test. An electrocardiogram (ECG) will record your heartbeat when you are at rest and when you are exercising.    Please arrive 15 minutes prior to your appointment time for registration and insurance purposes.   The test will take approximately 45 minutes to complete.   How to prepare for your Exercise Stress Test: Do bring a list of your current medications with you.  If not listed below, you may take your medications as normal. Do wear comfortable clothes (no dresses or overalls) and walking shoes, tennis shoes preferred (no heels or open toed shoes are allowed) Do Not wear cologne, perfume, aftershave or lotions (deodorant is allowed). Do not drink or eat foods with caffeine for 24 hours before the test. (Chocolate, coffee, tea, or energy drinks) If you use an inhaler, bring it with you to the test. Do not smoke for 4 hours before the test.    If these instructions are not followed, your test will have to be rescheduled.   If you cannot keep your appointment, please provide 24 hours notification to our office, to avoid a possible $50 charge to your account.     Follow-Up: At [facility], you and your health needs are our priority.  As part of our continuing mission to provide you with exceptional heart care, our providers are all part of one team.  This team includes your primary Cardiologist (physician) and Advanced Practice Providers or APPs (Physician Assistants and Nurse Practitioners) who all work together to provide you with the care you need, when you need it.  Your next appointment:   KEEP JULY APPOINTMENT WITH DR. HOCHREIN  Provider:   Eilleen Grates, MD

## 2024-01-16 NOTE — Progress Notes (Signed)
 Cardiology Office Note:    Date:  01/16/2024  ID:  BURRELL HODAPP, DOB 1961/04/17, MRN [id number] PCP: Eliodoro Guerin, DO  [facility] Providers Cardiologist:  Eilleen Grates, MD       Patient Profile:       Chief Complaint: Hospital follow-up for bradycardia History of Present Illness:  Jeffrey Hooper is a 63 y.o. male with visit-pertinent history of  coronary artery disease, hypertension, hyperlipidemia   Lexiscan  stress test 11/2015 suggested moderate size defect in the inferior wall with ischemia.  The EF was well-preserved.  Patient presented to [facility] on 12/02/2015 for cardiac catheterization and was found to have single-vessel obstructive coronary artery disease involving the mid LAD.  He underwent successful PCI with mid LAD DES.  Echocardiogram at that time showed LVEF 60 to 65% with no RWMA.  In 2021 he presented with chest pain and had a negative Lexiscan  Myoview .  ZIO 09/2019 showed NSR, rare PVCs and SVE's, one 4 beat run of SVT, with no sustained arrhythmias.  He was seen in clinic on 12/09/2023.  He is complaining of worsening persistent lower extremity edema.  His amlodipine  was discontinued as it is thought to be a potential contributor and replaced with olmesartan  40 mg daily.  Most recently he was admitted to the hospital 12/23/2023 for new onset sinus bradycardia.  His heart rate during admission was 40s-50s with no pauses or high-grade AV block.  It was unclear if his symptoms were related to bradycardia or perhaps another process as he also had leukocytosis with WBCs at 15.4.  On ambulation his heart rate would increase to 80 without cardiac limitation.  TTE was completed on 5/20 that showed LVEF 60 to 65%, mild dilated LV, borderline dilated ascending aorta, trivial pericardial effusion.  It was thought that a brief viral illness had caused his bradycardia.  UA, CRP, ESR, and procalcitonin were all negative.  He was placed on a left 14-day ZIO monitor prior to  discharge to help assess chronotropic incompetence.  He did have a mild AKI and minimal bump in AST/ALT and his hydrochlorothiazide  was decreased to 25 mg daily.   Discussed the use of AI scribe software for clinical note transcription with the patient, who gave verbal consent to proceed.  Jeffrey Hooper is a 63 year old male with a history of bradycardia who presents with concerns about his heart rate and associated symptoms. He is accompanied by his wife, Jeffrey Hooper.  Today he brings in his blood pressure/heart rate log showing average BP 120s to 130s and average heart rate in the upper 40s to low 50s.  With lowest HR reading 38.  His current symptoms include fatigue, weakness, and lightheadedness, especially when standing or after physical activity. He has not experienced syncope but feels he has come close after using his lawnmower to cut his grass.  Denies any chest pains.  He has a history of leg swelling, which improved after discontinuing amlodipine . He experiences frequent palpitations described as 'fluttering,' ongoing for years.  He has constant headaches since his bradycardia began. He denies fevers or chills.  Review of systems:  Please see the history of present illness. All other systems are reviewed and otherwise negative.      Studies Reviewed:        Echocardiogram 12/24/2023  1. Left ventricular ejection fraction, by estimation, is 60 to 65%. The  left ventricle has normal function. The left ventricle has no regional  wall motion abnormalities. There is mild  concentric left ventricular  hypertrophy. Left ventricular diastolic  parameters were normal.   2. Right ventricular systolic function is normal. The right ventricular  size is mildly enlarged. Tricuspid regurgitation signal is inadequate for  assessing PA pressure.   3. Left atrial size was mildly dilated.   4. The mitral valve is normal in structure. Trivial mitral valve  regurgitation.   5. The aortic valve is  tricuspid. Aortic valve regurgitation is not  visualized.   6. There is borderline dilatation of the ascending aorta, measuring 39  mm.   7. The inferior vena cava is normal in size with greater than 50%  respiratory variability, suggesting right atrial pressure of 3 mmHg.   Risk Assessment/Calculations:              Physical Exam:   VS:  BP (!) 124/56   Pulse 67   Ht 5' 10.5 (1.791 m)   Wt 214 lb (97.1 kg)   SpO2 97%   BMI 30.27 kg/m    Wt Readings from Last 3 Encounters:  01/16/24 214 lb (97.1 kg)  01/07/24 215 lb (97.5 kg)  12/25/23 209 lb (94.8 kg)    GEN: Well nourished, well developed in no acute distress NECK: No JVD; No carotid bruits CARDIAC: RRR, no murmurs, rubs, gallops RESPIRATORY:  Clear to auscultation without rales, wheezing or rhonchi  ABDOMEN: Soft, non-tender, non-distended EXTREMITIES:  No edema; No acute deformity      Assessment and Plan:  Bradycardia Recently admitted 12/2023 for bradycardia with HR 40s-50s.  On telemetry he had no high-grade block, pauses, or syncope Was unclear if his symptoms were related to bradycardia or perhaps another process as he did have leukocytosis that has since resolved During admission he was able to ambulate without limitation and HR increased to 80 bpm Echocardiogram 12/24/2023 with LVEF 60 to 65% and no RWMA - Today he is hemodynamically stable - Average HR at home upper 40s-lower 50s with lowest recorded HR 38 - No syncope however 1 episode of presyncope after mowing his lawn.  Does note to have some intermittent fatigue, shortness of breath, and lightheadedness - Currently pending 14-day ZIO monitor that was placed at hospital discharge.  Will evaluate results when available and consider referral to EP if needed - Plan for exercise tolerance test today to assess for chronotropic incompetence - Will have him follow-up with Dr. Lavonne Prairie in 3 weeks for further evaluation   Coronary artery disease S/p PCI/DES to  mLAD on 11/2015 Lexiscan  Myoview  09/2019 negative for ischemia - Has experienced some occasional atypical chest discomfort described as shooting that is reproducible with palpation/movement and is not exacerbated on exertion.  Denies anginal equivalent.  Ongoing now for 3 weeks.  Troponins, C-reactive protein, sed rate were all negative during recent admission - ETT has been ordered to assess for chronotropic incompetence as noted above - Continue aspirin  80 mg daily and atorvastatin  80 mg daily   Lower extremity swelling Echocardiogram 12/2023 with LVEF 60 to 65%, no RWMA, normal diastolic parameters - Resolved after discontinuation of amlodipine    Hypertension Blood pressure today is 124/56 and under adequate control - Continue hydrochlorothiazide  25 mg daily and olmesartan  40 mg daily   Hyperlipidemia LDL 69 on 12/2023 and well-controlled - Continue atorvastatin  80 mg daily - Continue heart healthy diet   Palpitations ZIO 09/2019 without atrial fibrillation or sustained arrhythmias He notes ongoing palpitations since at least 2017.  His palpitations occur daily - Currently pending ZIO after recent hospital admission  for bradycardia  Informed Consent   Shared Decision Making/Informed Consent The risks [chest pain, shortness of breath, cardiac arrhythmias, dizziness, blood pressure fluctuations, myocardial infarction, stroke/transient ischemic attack, and life-threatening complications (estimated to be 1 in 10,000)], benefits (risk stratification, diagnosing coronary artery disease, treatment guidance) and alternatives of an exercise tolerance test were discussed in detail with Jeffrey Hooper and he agrees to proceed.          Dispo:  Return in about 1 month (around 02/15/2024).  Signed, [name], NP

## 2024-01-20 ENCOUNTER — Other Ambulatory Visit (HOSPITAL_COMMUNITY)

## 2024-01-21 ENCOUNTER — Encounter: Payer: Self-pay | Admitting: Gastroenterology

## 2024-01-21 ENCOUNTER — Other Ambulatory Visit (INDEPENDENT_AMBULATORY_CARE_PROVIDER_SITE_OTHER)

## 2024-01-21 ENCOUNTER — Ambulatory Visit (INDEPENDENT_AMBULATORY_CARE_PROVIDER_SITE_OTHER): Admitting: Gastroenterology

## 2024-01-21 DIAGNOSIS — G8929 Other chronic pain: Secondary | ICD-10-CM

## 2024-01-21 DIAGNOSIS — R110 Nausea: Secondary | ICD-10-CM

## 2024-01-21 DIAGNOSIS — R1013 Epigastric pain: Secondary | ICD-10-CM

## 2024-01-21 DIAGNOSIS — K219 Gastro-esophageal reflux disease without esophagitis: Secondary | ICD-10-CM

## 2024-01-21 DIAGNOSIS — K625 Hemorrhage of anus and rectum: Secondary | ICD-10-CM

## 2024-01-21 DIAGNOSIS — R748 Abnormal levels of other serum enzymes: Secondary | ICD-10-CM

## 2024-01-21 DIAGNOSIS — K602 Anal fissure, unspecified: Secondary | ICD-10-CM

## 2024-01-21 DIAGNOSIS — K648 Other hemorrhoids: Secondary | ICD-10-CM

## 2024-01-21 LAB — CBC WITH DIFFERENTIAL/PLATELET
Basophils Absolute: 0.1 10*3/uL (ref 0.0–0.1)
Basophils Relative: 0.9 % (ref 0.0–3.0)
Eosinophils Absolute: 0.3 10*3/uL (ref 0.0–0.7)
Eosinophils Relative: 4.1 % (ref 0.0–5.0)
HCT: 40.8 % (ref 39.0–52.0)
Hemoglobin: 14 g/dL (ref 13.0–17.0)
Lymphocytes Relative: 18.2 % (ref 12.0–46.0)
Lymphs Abs: 1.2 10*3/uL (ref 0.7–4.0)
MCHC: 34.4 g/dL (ref 30.0–36.0)
MCV: 89.9 fl (ref 78.0–100.0)
Monocytes Absolute: 0.6 10*3/uL (ref 0.1–1.0)
Monocytes Relative: 9.5 % (ref 3.0–12.0)
Neutro Abs: 4.5 10*3/uL (ref 1.4–7.7)
Neutrophils Relative %: 67.3 % (ref 43.0–77.0)
Platelets: 237 10*3/uL (ref 150.0–400.0)
RBC: 4.54 Mil/uL (ref 4.22–5.81)
RDW: 14.2 % (ref 11.5–15.5)
WBC: 6.7 10*3/uL (ref 4.0–10.5)

## 2024-01-21 LAB — COMPREHENSIVE METABOLIC PANEL WITH GFR
ALT: 32 U/L (ref 0–53)
AST: 19 U/L (ref 0–37)
Albumin: 4.3 g/dL (ref 3.5–5.2)
Alkaline Phosphatase: 81 U/L (ref 39–117)
BUN: 14 mg/dL (ref 6–23)
CO2: 32 meq/L (ref 19–32)
Calcium: 9.2 mg/dL (ref 8.4–10.5)
Chloride: 102 meq/L (ref 96–112)
Creatinine, Ser: 1.15 mg/dL (ref 0.40–1.50)
GFR: 68.11 mL/min (ref >60.00)
Glucose, Bld: 97 mg/dL (ref 70–99)
Potassium: 3.7 meq/L (ref 3.5–5.1)
Sodium: 141 meq/L (ref 135–145)
Total Bilirubin: 0.9 mg/dL (ref 0.2–1.2)
Total Protein: 6.4 g/dL (ref 6.0–8.3)

## 2024-01-21 LAB — PROTIME-INR
INR: 1 ratio (ref 0.8–1.0)
Prothrombin Time: 10.7 s (ref 9.6–13.1)

## 2024-01-21 LAB — LIPASE: Lipase: 28 U/L (ref 11.0–59.0)

## 2024-01-21 MED ORDER — AMBULATORY NON FORMULARY MEDICATION: 0 refills | Status: DC

## 2024-01-21 MED ORDER — ESOMEPRAZOLE MAGNESIUM 40 MG PO CPDR: 40.0000 mg | DELAYED_RELEASE_CAPSULE | Freq: Two times a day (BID) | ORAL | 2 refills | Status: DC

## 2024-01-21 NOTE — Patient Instructions (Signed)
 _______________________________________________________  If your blood pressure at your visit was 140/90 or greater, please contact your primary care physician to follow up on this.  _______________________________________________________  If you are age 63 or older, your body mass index should be between 23-30. Your Body mass index is 30.55 kg/m. If this is out of the aforementioned range listed, please consider follow up with your Primary Care Provider.  If you are age 67 or younger, your body mass index should be between 19-25. Your Body mass index is 30.55 kg/m. If this is out of the aformentioned range listed, please consider follow up with your Primary Care Provider.   ________________________________________________________  The [facility] providers would like to encourage you to use MYCHART to communicate with providers for non-urgent requests or questions.  Due to long hold times on the telephone, sending your provider a message by [facility] may be a faster and more efficient way to get a response.  Please allow 48 business hours for a response.  Please remember that this is for non-urgent requests.  _______________________________________________________  Your provider has requested that you go to the basement level for lab work before leaving today. Press B on the elevator. The lab is located at the first door on the left as you exit the elevator.  DISCONITNUE; pantoprazole   We have sent the following medications to your pharmacy for you to pick up at your convenience:  START: Nexium  40mg  one capsule two times daily  We have sent a prescription for Diltiazem 2% gel to [facility] for you. Using your index finger, you should apply a small amount of medication inside the rectum up to your first knuckle/joint three times daily x 2 months.  [facility]'s information is below: Address: [address] [place], [place] [address]  [phone]  *Please DO  NOT go directly from our office to pick up this medication! Give the pharmacy 1 day to process the prescription as this is compounded and takes time to make.  You have been scheduled for an Upper GI Series at [facility]. Your appointment is on 02-10-24 at 9am. Please arrive 30 minutes prior to your test for registration. Make sure not to eat or drink anything after midnight on the night before your test. If you need to reschedule, please call radiology at [phone]. ________________________________________________________________ An upper GI series uses x rays to help diagnose problems of the upper GI tract, which includes the esophagus, stomach, and duodenum. The duodenum is the first part of the small intestine. An upper GI series is conducted by a radiology technologist or a radiologist--a doctor who specializes in x-ray imaging--at a hospital or outpatient center. While sitting or standing in front of an x-ray machine, the patient drinks barium liquid, which is often white and has a chalky consistency and taste. The barium liquid coats the lining of the upper GI tract and makes signs of disease show up more clearly on x rays. X-ray video, called fluoroscopy, is used to view the barium liquid moving through the esophagus, stomach, and duodenum. Additional x rays and fluoroscopy are performed while the patient lies on an x-ray table. To fully coat the upper GI tract with barium liquid, the technologist or radiologist may press on the abdomen or ask the patient to change position. Patients hold still in various positions, allowing the technologist or radiologist to take x rays of the upper GI tract at different angles. If a technologist conducts the upper GI series, a radiologist will  later examine the images to look for problems.  This test typically takes about 1 hour to complete. __________________________________________________________________  Due to recent changes in healthcare laws,  you may see the results of your imaging and laboratory studies on MyChart before your provider has had a chance to review them.  We understand that in some cases there may be results that are confusing or concerning to you. Not all laboratory results come back in the same time frame and the provider may be waiting for multiple results in order to interpret others.  Please give us  48 hours in order for your provider to thoroughly review all the results before contacting the office for clarification of your results.   Thank you for entrusting me with your care and choosing [facility].  Valiant Gaul, PA-C

## 2024-01-21 NOTE — Progress Notes (Signed)
 [name] [id number] 1961/01/31   Chief Complaint: Elevated liver enzymes, abdominal pain, hemorrhoids  Referring Provider: Eliodoro Guerin, DO Primary GI MD: Dr. Willy Harvest (previous Dr. Howard Macho)  HPI: Jeffrey Hooper is a 63 y.o. male with past medical history of CAD, GERD, allergy  to alpha gal, kidney stones, HLD, HTN, hypothyroidism, neuromuscular disorder, hemorrhoids s/p banding who presents today for evaluation of elevated liver enzymes, abdominal pain, and hemorrhoids.  Patient last seen in office 08/22/2022 by [name], PA for LUQ abdominal pain and nausea.  Noted to have had a CT scan 06/2020 which showed a short segment jejunal intussusception in the left upper quadrant.  He saw surgery and underwent a diagnostic laparoscopy and repair of small umbilical hernia with no intussusception seen at that time. Plan at last visit was for gastric emptying study and addition of Carafate , with consideration for CT enterography pending results. 09/07/2022 normal GES 10/12/2022 no acute findings on CT enterography  5/19 - 12/25/2023 patient admitted to the hospital with symptomatic bradycardia with HR 40s to 50s with no pauses or high-grade AV block.  While walking in the hall his heart rate increased to 80, able to ambulate without cardiac limitation.  He had a TTE on 12/24/2023 that showed an LVEF of 60 to 65%, mild dilated LV, borderline dilated a ascending aorta, trivial pericardial effusion. On admission, patient's WBC was elevated and further rose to 15.4 in the absence of fever, swelling, dysuria.  Workup was negative.   He was also found to have minimally elevated LFTs.  Labs 01/07/2024: Acute hepatitis panel negative, consistent with hep B immunity, normal CBC, mildly elevated ALT at 46, AST 24, alk phos 95, total bilirubin 0.8  01/16/2024 Patient had follow-up with cardiology, ZIO monitor pending, planning for exercise tolerance test to assess for chronotropic incompetence, follow-up  in 3 weeks.   Patient states he has been having problems with upper abdominal pain and pressure.  He has been having a lot of acid reflux despite taking Protonix  twice a day.  States he has been on Protonix  for a long time, but has been having breakthrough symptoms for a couple years now which has become more bothersome.  He has been feeling nauseated, the nausea comes and goes.  He takes Zofran  and Phenergan  as needed for this.  He has not noticed any association with eating.  It seems to be sporadic.  Also reports recent problems with hemorrhoids.  Had hemorrhoid banding in the past, and is unsure if he needs another banding procedure.  He sees bright red blood sometimes when wiping.  He reports having regular bowel movements and denies any constipation or diarrhea.  Started noticing blood on the toilet paper about a month ago, sees blood on the toilet paper every time he has a bowel movement.  He has follow-up with cardiology 02/05/2024, planning for stress test 02/06/2024.   Previous GI Procedures/Imaging   RUQ US  01/08/2024 - Increased hepatic echogenicity, a nonspecific finding that is most commonly seen on the basis of steatosis in the absence of known liver disease. - Otherwise unremarkable exam.  CT entero A/P 10/12/2022 - No acute findings. - No radiographic evidence of intussusception, inflammatory bowel disease, or other intestinal pathology. - Aortic Atherosclerosis (ICD10-I70.0).  GES 09/07/2022 - Normal gastric emptying study  EGD 12/13/2020 (Dr. Howard Macho) - Mild to moderate, non- specific gastritis. Biopsied.  - The examination was otherwise normal. Path: Surgical [P], gastric antrum and gastric body - MILD CHRONIC GASTRITIS. [name]  STAIN IS NEGATIVE FOR HELICOBACTER PYLORI.  Colonoscopy 12/13/2020 - The examined portion of the ileum was normal.  - Internal hemorrhoids.  - The examination was otherwise normal on direct and retroflexion views.  - No polyps or  cancers. - Recall 10 years   Past Medical History:  Diagnosis Date   Allergy     Allergy  to alpha-gal    Anxiety    Arthritis    CAD (coronary artery disease), native coronary artery 12/03/2015   Coronary artery disease    Dysrhythmia    irregular and PAC'S   GERD (gastroesophageal reflux disease)    Head injury with loss of consciousness (HCC)    back in the 1980's   History of kidney stones    Hyperlipidemia    Hypertension    Hypothyroid    Multiple benign nevi    Neuromuscular disorder (HCC) 2011   LEFT RADIAL NERVE SURGERY R/T TRAUMA   Pneumonia 2014, 2017   Prostatism    Pulmonary nodule    Spinal cord stimulator status    told to bring remote to surgery    Past Surgical History:  Procedure Laterality Date   BACK SURGERY     T12 - L1, 11 times including neck   CARDIAC CATHETERIZATION N/A 12/02/2015   Procedure: Left Heart Cath and Coronary Angiography;  Surgeon: Peter M Swaziland, MD;  Location: [facility] INVASIVE CV LAB;  Service: Cardiovascular;  Laterality: N/A;   CARDIAC CATHETERIZATION N/A 12/02/2015   Procedure: Coronary Stent Intervention;  Surgeon: Peter M Swaziland, MD;  Location: [facility] INVASIVE CV LAB;  Service: Cardiovascular;  Laterality: N/A;  mid LAD Promus 2.5x12   CERVICAL FUSION  2023   C4 and C5   COLONOSCOPY     CORONARY ANGIOPLASTY     one stent placed by Dr. P Swaziland   HARDWARE REMOVAL Right 08/26/2017   Procedure: Right Lumbar Five Revision of pedicle screw with Removal of Lumbar Five Screw;  Surgeon: Elna Haggis, MD;  Location: [facility] OR;  Service: Neurosurgery;  Laterality: Right;  posterior   HARDWARE REMOVAL Right 12/27/2017   Procedure: Right Lumbar Two, Lumbar Three, Lumbar Four Pedicle screw removal with metrex;  Surgeon: Elna Haggis, MD;  Location: [facility] OR;  Service: Neurosurgery;  Laterality: Right;  Right L2 to L4 Pedicle screw removal with mterex   HEMORRHOID BANDING     HERNIA REPAIR     LAPAROSCOPY N/A 09/01/2020   Procedure: LAPAROSCOPY  DIAGNOSTIC;  Surgeon: Joyce Nixon, MD;  Location: [facility] ORS;  Service: General;  Laterality: N/A;   MASS EXCISION  06/25/2012   Procedure: EXCISION MASS;  Surgeon: Kemp Patter, MD;  Location: [facility];  Service: Orthopedics;  Laterality: Left;  transection of NEUROMA, BURYING RADIAL NERVE IN BRACHIORADIALIS LEFT SIDE   radial nerve Left    cut in work injury   RIGHT GREAT TOENAIL REMOVAL  04/2020   SPINAL FUSION     C6-7   TONSILLECTOMY  1982   UMBILICAL HERNIA REPAIR N/A 09/01/2020   Procedure: PRIMARY UMBILICAL HERNIA REPAIR;  Surgeon: Joyce Nixon, MD;  Location: [facility] ORS;  Service: General;  Laterality: N/A;   VASECTOMY      Current Outpatient Medications  Medication Sig Dispense Refill   acetaminophen  (TYLENOL ) 500 MG tablet Take 1,000 mg by mouth daily as needed for mild pain or headache.      albuterol  (VENTOLIN  HFA) 108 (90 Base) MCG/ACT inhaler TAKE 2 PUFFS BY MOUTH EVERY 6 HOURS AS NEEDED FOR WHEEZE OR SHORTNESS  OF BREATH 18 each 1   aspirin  EC 81 MG tablet Take 81 mg by mouth at bedtime.     atorvastatin  (LIPITOR ) 80 MG tablet TAKE 1 TABLET BY MOUTH EVERYDAY AT BEDTIME 90 tablet 3   betamethasone  dipropionate 0.05 % cream Apply 1 Application topically 2 (two) times daily as needed (swelling/ itching).     cetirizine  (ZYRTEC ) 10 MG tablet Take 10 mg by mouth daily.     Cyanocobalamin  (B-12 COMPLIANCE INJECTION) 1000 MCG/ML KIT Inject 1,000 mcg as directed every 30 (thirty) days.     EPINEPHrine  0.3 mg/0.3 mL IJ SOAJ injection Inject 0.3 mg into the muscle as needed for anaphylaxis. 2 each 3   fluticasone  (FLONASE ) 50 MCG/ACT nasal spray Place 2 sprays into both nostrils daily. 48 g 1   hydrochlorothiazide  (HYDRODIURIL ) 25 MG tablet Take 1 tablet (25 mg total) by mouth daily. 30 tablet 2   HYDROcodone -acetaminophen  (NORCO/VICODIN) 5-325 MG tablet Take 1 tablet by mouth every 6 (six) hours as needed for moderate pain (pain score 4-6).     levothyroxine  (SYNTHROID )  100 MCG tablet Take 1 tablet (100 mcg total) by mouth daily. 90 tablet 3   [Paused] meloxicam  (MOBIC ) 7.5 MG tablet Take 7.5 mg by mouth at bedtime.     nitroGLYCERIN  (NITROSTAT ) 0.4 MG SL tablet PLACE 1 TABLET UNDER THE TONGUE EVERY 5 MINUTES AS NEEDED FOR CHEST PAIN. 25 tablet 2   olmesartan  (BENICAR ) 40 MG tablet Take 1 tablet (40 mg total) by mouth daily. 90 tablet 2   ondansetron  (ZOFRAN -ODT) 4 MG disintegrating tablet TAKE 1 TABLET BY MOUTH EVERY 8 HOURS AS NEEDED FOR NAUSEA AND VOMITING 20 tablet 0   pantoprazole  (PROTONIX ) 40 MG tablet Take 1 tablet (40 mg total) by mouth 2 (two) times daily. 180 tablet 3   PARoxetine  (PAXIL ) 20 MG tablet Take 1 tablet (20 mg total) by mouth daily. 90 tablet 3   promethazine  (PHENERGAN ) 25 MG tablet TAKE 0.5-1 TABLETS (12.5-25 MG TOTAL) BY MOUTH 2 (TWO) TIMES DAILY AS NEEDED FOR NAUSEA OR VOMITING. 45 tablet 1   Current Facility-Administered Medications  Medication Dose Route Frequency Provider Last Rate Last Admin   cyanocobalamin  (VITAMIN B12) injection 1,000 mcg  1,000 mcg Intramuscular Q30 days [name], DO   1,000 mcg at 12/31/23 9604    Allergies as of 01/21/2024 - Review Complete 01/16/2024  Allergen Reaction Noted   Alpha-gal Rash and Other (See Comments) 06/03/2017   Beef (bovine) protein Rash and Other (See Comments) 01/28/2017   Beef-derived drug products Rash and Other (See Comments) 02/27/2016   Lambs quarters Rash and Other (See Comments) 02/27/2016   Pork-derived products Rash and Other (See Comments) 02/27/2016   Prednisone  Other (See Comments) 03/30/2013   Brilinta  [ticagrelor ] Hives 12/06/2015   Doxycycline Hives 01/28/2017   Gabapentin  Swelling 12/23/2019   Lyrica [pregabalin]  10/19/2019   Methylprednisolone  sodium succ  12/16/2017   Penicillins Hives 11/28/2011   Shellfish allergy  Hives 07/26/2016   Adhesive [tape] Rash 05/24/2017   Fentanyl  Rash 10/13/2012    Family History  Problem Relation Age of Onset    Heart failure Mother 76       Died age 74   Congestive Heart Failure Mother    Heart attack Father 75       Died with MI   Heart attack Paternal Grandmother    Heart attack Paternal Grandfather    Colon cancer Neg Hx     Social History   Tobacco Use  Smoking status: Former    Current packs/day: 0.00    Average packs/day: 1 pack/day for 20.0 years (20.0 ttl pk-yrs)    Types: Cigarettes    Start date: 11/28/1986    Quit date: 11/28/2006    Years since quitting: 17.1   Smokeless tobacco: Never  Vaping Use   Vaping status: Never Used  Substance Use Topics   Alcohol use: Not Currently    Comment: rarely   Drug use: No     Review of Systems:    Constitutional: No unexplained weight loss, fever, chills, weakness or fatigue Skin: No rash or itching Cardiovascular: No chest pain, chest pressure or palpitations   Respiratory: No SOB or cough Gastrointestinal: See HPI and otherwise negative Neurological: No headache, dizziness or syncope Musculoskeletal: No new muscle or joint pain Hematologic: No bruising    Physical Exam:  Vital signs: BP 122/70   Pulse 68   Ht 5' 10.5 (1.791 m)   Wt 216 lb (98 kg)   SpO2 98%   BMI 30.55 kg/m    Constitutional: NAD, Well developed, Well nourished, alert and cooperative Head:  Normocephalic and atraumatic.  Eyes: No scleral icterus. Conjunctiva pink. Mouth: No oral lesions. Respiratory: Respirations even and unlabored. Lungs clear to auscultation bilaterally.  No wheezes, crackles, or rhonchi.  Cardiovascular:  Regular rate and rhythm. No murmurs. No peripheral edema. Gastrointestinal:  Soft, nondistended, nontender. No rebound or guarding. Normal bowel sounds. No appreciable masses or hepatomegaly. Rectal:  Anal fissure at anterior midline which is mildly tender to palpation. No visible bleeding.  Neurologic:  Alert and oriented x4;  grossly normal neurologically.  Skin:   Dry and intact without significant lesions or  rashes. Psychiatric: Oriented to person, place and time. Demonstrates good judgement and reason without abnormal affect or behaviors.   RELEVANT LABS AND IMAGING: CBC    Component Value Date/Time   WBC 9.6 01/07/2024 1517   WBC 15.4 (H) 12/25/2023 0242   RBC 4.56 01/07/2024 1517   RBC 4.87 12/25/2023 0242   HGB 14.5 01/07/2024 1517   HCT 43.0 01/07/2024 1517   PLT 237 01/07/2024 1517   MCV 94 01/07/2024 1517   MCH 31.8 01/07/2024 1517   MCH 31.4 12/25/2023 0242   MCHC 33.7 01/07/2024 1517   MCHC 34.7 12/25/2023 0242   RDW 13.3 01/07/2024 1517   LYMPHSABS 1.8 01/07/2024 1517   MONOABS 1.1 (H) 12/25/2023 0242   EOSABS 0.3 01/07/2024 1517   BASOSABS 0.1 01/07/2024 1517    CMP     Component Value Date/Time   NA 139 01/07/2024 1517   K 4.7 01/07/2024 1517   CL 99 01/07/2024 1517   CO2 25 01/07/2024 1517   GLUCOSE 93 01/07/2024 1517   GLUCOSE 128 (H) 12/25/2023 0242   BUN 17 01/07/2024 1517   CREATININE 1.20 01/07/2024 1517   CREATININE 1.08 04/03/2016 1014   CALCIUM  9.3 01/07/2024 1517   PROT 6.0 01/07/2024 1517   ALBUMIN  4.1 01/07/2024 1517   AST 24 01/07/2024 1517   ALT 46 (H) 01/07/2024 1517   ALKPHOS 95 01/07/2024 1517   BILITOT 0.8 01/07/2024 1517   GFRNONAA >60 12/25/2023 0242   GFRNONAA 65 10/29/2012 0922   GFRAA 80 09/21/2020 0915   GFRAA 75 10/29/2012 0922   Echocardiogram 12/23/2023 1. Left ventricular ejection fraction, by estimation, is 60 to 65% . The left ventricle has normal function. The left ventricle has no regional wall motion abnormalities. There is mild concentric left ventricular hypertrophy. Left ventricular diastolic  parameters were normal.  2. Right ventricular systolic function is normal. The right ventricular size is mildly enlarged. Tricuspid regurgitation signal is inadequate for assessing PA pressure.  3. Left atrial size was mildly dilated.  4. The mitral valve is normal in structure. Trivial mitral valve regurgitation. 5. The aortic  valve is tricuspid. Aortic valve regurgitation is not visualized. 6. There is borderline dilatation of the ascending aorta, measuring 39 mm. 7. The inferior vena cava is normal in size with greater than 50% respiratory variability, suggesting right atrial pressure of 3 mmHg.  Assessment/Plan:   Elevated liver enzymes Suspected MASH by history and imaging. Has had normal lipid panel, TSH, hepatitis panel.  Most recent labs showed only a mild elevation in ALT to 46. FIB-4 score 0.93, fibrosis stage 0-1, advanced fibrosis excluded.  - Check CBC, CMP, PT/INR, TTG, IgA. Pending results may pursue further laboratory workup.   GERD Nausea  Epigastric pain Patient having breakthrough acid reflux and heartburn despite Protonix  twice daily.  Continues to have intermittent nausea and epigastric pain.  Last EGD 2022 showed chronic gastritis and was otherwise normal, negative for H. Pylori.  - Will order UGI series - Change PPI to esomeprazole  40mg  BID - Consider repeat EGD after cardiology workup  Anal fissure Rectal bleeding Internal hemorrhoids Patient with history of internal hemorrhoids, seen on colonoscopy 2022.  History of banding 01/2021.  For the last month has been seeing blood on the toilet paper after bowel movements.  Anterior midline anal fissure seen on exam today.  - Start Diltiazem 2%/lidocaine  5% compound cream TID for 6-8 weeks   - Determine at follow up if hemorrhoid banding indicated   Valiant Gaul, PA-C [facility] 01/21/2024, 8:39 AM  Patient Care Team: Eliodoro Guerin, DO as PCP - General (Family Medicine) Eilleen Grates, MD as PCP - Cardiology (Cardiology) Janel Medford, MD (Inactive) as Attending Physician (Gastroenterology) Elna Haggis, MD as Consulting Physician (Neurosurgery) Alexia Idler, OD (Optometry)

## 2024-01-22 LAB — TISSUE TRANSGLUTAMINASE, IGA: (tTG) Ab, IgA: <1 U/mL

## 2024-01-22 LAB — IGA: Immunoglobulin A: 171 mg/dL (ref 70–320)

## 2024-01-23 ENCOUNTER — Ambulatory Visit: Payer: Self-pay | Admitting: Student

## 2024-01-23 ENCOUNTER — Ambulatory Visit: Payer: Self-pay | Admitting: Gastroenterology

## 2024-01-23 ENCOUNTER — Encounter: Payer: Self-pay | Admitting: Cardiology

## 2024-01-23 ENCOUNTER — Telehealth: Payer: Self-pay | Admitting: Emergency Medicine

## 2024-01-23 DIAGNOSIS — R42 Dizziness and giddiness: Secondary | ICD-10-CM | POA: Diagnosis not present

## 2024-01-23 DIAGNOSIS — R001 Bradycardia, unspecified: Secondary | ICD-10-CM | POA: Diagnosis not present

## 2024-01-23 NOTE — Telephone Encounter (Signed)
 Pt is having a treadmill stress test.. he says that he uses a cane and has pelvic issues... he does not think that he can walk efficiently... I will send to madison for his review.   Pt scheduled for 02/06/24.

## 2024-01-23 NOTE — Telephone Encounter (Signed)
 [facility] talked to patient and discussed the results to the heart monitor that he wore in May. Patient needed for the ETT to be canceled due to pelvic issues and ambulation issues. Madison explained the results and let the patient know that the ETT was not needed at this time and to keep his February 05, 2024 appointment with Dr. Lavonne Prairie.

## 2024-01-23 NOTE — Telephone Encounter (Signed)
 Pt has concerns regarding upcoming on 7/3 that he feels like he may not be able to walk on it. Requesting cb to discuss other options

## 2024-01-24 ENCOUNTER — Telehealth (HOSPITAL_COMMUNITY): Payer: Self-pay | Admitting: Emergency Medicine

## 2024-01-24 NOTE — Telephone Encounter (Signed)
 Patient cancelled GXT for reason below:  01/23/2024 4:40 PM [id number], CRYSTAL L  Cancel Rsn: Patient (Unable to walk at this time due to pelvic issues and cane use.)   Order will be removed from the GXT WQ and when patient calls back to reschedule we will reinstate the order. Thank you.

## 2024-01-24 NOTE — Telephone Encounter (Signed)
 Pt notified of Dr. Atlas Lea suggestions via MyChart. Last read by Estelle Helm at 9:18AM on 01/24/2024.

## 2024-02-03 ENCOUNTER — Ambulatory Visit (INDEPENDENT_AMBULATORY_CARE_PROVIDER_SITE_OTHER)

## 2024-02-03 DIAGNOSIS — E538 Deficiency of other specified B group vitamins: Secondary | ICD-10-CM | POA: Diagnosis not present

## 2024-02-03 NOTE — Progress Notes (Signed)
 Patient is in office today for a nurse visit for B12 Injection. Patient Injection was given in the  Right deltoid. Patient tolerated injection well.

## 2024-02-04 NOTE — Progress Notes (Unsigned)
 Cardiology Office Note:   Date:  02/05/2024  ID:  Jeffrey Hooper, DOB 10/06/1960, MRN [id number] PCP: [name], DO  [facility] Providers Cardiologist:  Lynwood Schilling, MD {  History of Present Illness:   Jeffrey Hooper is a 63 y.o. male who presents follow up of CAD.  Stress perfusion study in 2017 suggested a moderate sized defect in the inferior wall with ischemia. The EF was well-preserved.  The patient presented to [facility] on 12/02/15 for cath and was found to have single vessel obstructive CAD involving the mid LAD. He underwent successful PCI with a mid LAD DES.  He had chest pain in 2021 and had a negative perfusion study.      Since I last saw him he was admitted with bradycardia.  He was monitored and did not have symptomatic bradycardia.  Zio patch 14 days demonstrated no significant arrhythmias.  His wife was taking his heart rate at home with a pulse ox when she noted to be in the 30s.  He has felt weak.  He walks with a cane.  He has noticed that he has had some racing heartbeats and some mild shortness of breath if he is trying to walk up an incline with his cane.  He has chronic back and hip problems.  He has been getting some chest discomfort mildly.  He has been having some tachypalpitations but no presyncope or syncope associated with slow heart rates.  He thinks a lot of his symptoms including the shortness of breath, vague chest discomfort are reminiscent of symptoms he had prior to angioplasty.  He was seen in follow-up in the hospitalization in our office and he was going to get a treadmill test to see if he had chronotropic competence or possibly is an ischemia evaluation but he canceled that because he cannot really walk on a treadmill.  He is not having any presyncope or syncope.  He is not having any resting shortness of breath, PND or orthopnea.  He has had no weight gain or edema.  ROS: As stated in the HPI and negative for all other systems.  Studies  Reviewed:    EKG:     Sinus bradycardia, rate 53, axis within normal limits, intervals within normal limits, 12/23/2023   Risk Assessment/Calculations:              Physical Exam:   VS:  BP 124/68   Pulse 68   Ht 5' 10.5 (1.791 m)   Wt 213 lb (96.6 kg)   BMI 30.13 kg/m    Wt Readings from Last 3 Encounters:  02/05/24 213 lb (96.6 kg)  01/21/24 216 lb (98 kg)  01/16/24 214 lb (97.1 kg)     GEN: Well nourished, well developed in no acute distress NECK: No JVD; No carotid bruits CARDIAC: RRR, no murmurs, rubs, gallops RESPIRATORY:  Clear to auscultation without rales, wheezing or rhonchi  ABDOMEN: Soft, non-tender, non-distended EXTREMITIES:  No edema; No deformity   ASSESSMENT AND PLAN:   CAD: The patient does have some vague symptoms of shortness of breath and chest discomfort.  He needs stress testing would not be able to walk on a treadmill.  I will schedule him for a Lexiscan  Myoview .    Essential hypertension:   The blood pressure is at target.  No change in therapy.    Dyslipidemia: LDL was 58 69 with an HDL of 46.  No change in therapy.   Bradycardia: He had no symptomatic  bradycardia arrhythmias on his monitor.  He had some SVT.  This will be addressed as below.  SVT: He and I had a long discussion about this.  I would prefer not to add medication for management of this as it will exacerbate some sinus bradycardia that he had on her previous EKG.  However, he would agree to have a conversation with me if he has more symptomatic tachypalpitations in the future.  Preop: I sent a message to Dr. Enid.  The patient has GI evaluation upcoming and I wanted to alert him to the ischemia evaluation he is having.     Follow up with me in 6 months or sooner based on symptoms.  Signed, Lynwood Schilling, MD

## 2024-02-05 ENCOUNTER — Encounter: Payer: Self-pay | Admitting: Cardiology

## 2024-02-05 ENCOUNTER — Encounter: Payer: Self-pay | Admitting: *Deleted

## 2024-02-05 ENCOUNTER — Ambulatory Visit (INDEPENDENT_AMBULATORY_CARE_PROVIDER_SITE_OTHER): Admitting: Cardiology

## 2024-02-05 VITALS — BP 124/68 | HR 68 | Ht 70.5 in | Wt 213.0 lb

## 2024-02-05 DIAGNOSIS — E785 Hyperlipidemia, unspecified: Secondary | ICD-10-CM

## 2024-02-05 DIAGNOSIS — I2510 Atherosclerotic heart disease of native coronary artery without angina pectoris: Secondary | ICD-10-CM

## 2024-02-05 DIAGNOSIS — T7800XD Anaphylactic reaction due to unspecified food, subsequent encounter: Secondary | ICD-10-CM

## 2024-02-05 DIAGNOSIS — R001 Bradycardia, unspecified: Secondary | ICD-10-CM

## 2024-02-05 DIAGNOSIS — R079 Chest pain, unspecified: Secondary | ICD-10-CM

## 2024-02-05 DIAGNOSIS — I10 Essential (primary) hypertension: Secondary | ICD-10-CM

## 2024-02-05 DIAGNOSIS — Z9861 Coronary angioplasty status: Secondary | ICD-10-CM

## 2024-02-05 MED ORDER — NITROGLYCERIN 0.4 MG SL SUBL: 0.4000 mg | SUBLINGUAL_TABLET | SUBLINGUAL | 3 refills | Status: DC | PRN

## 2024-02-05 MED ORDER — EPINEPHRINE 0.3 MG/0.3ML IJ SOAJ: 0.3000 mg | INTRAMUSCULAR | 0 refills | Status: AC | PRN

## 2024-02-05 NOTE — Patient Instructions (Addendum)
 Medication Instructions:   Nitroglycerin  & Epi-pen refilled today  Continue all other medications.     Labwork:  none  Testing/Procedures:  Your physician has requested that you have a lexiscan  myoview . For further information please visit [email or web address] Please follow instruction sheet, as given.  Office will contact with results via phone, letter or mychart.     Follow-Up:  6 months   Any Other Special Instructions Will Be Listed Below (If Applicable).   If you need a refill on your cardiac medications before your next appointment, please call your pharmacy.

## 2024-02-06 ENCOUNTER — Encounter (HOSPITAL_COMMUNITY)

## 2024-02-10 ENCOUNTER — Other Ambulatory Visit (HOSPITAL_COMMUNITY)

## 2024-02-12 ENCOUNTER — Ambulatory Visit (HOSPITAL_COMMUNITY)
Admission: RE | Admit: 2024-02-12 | Discharge: 2024-02-12 | Disposition: A | Discharge status: Home / Self Care | Source: Ambulatory Visit | Attending: Gastroenterology | Admitting: Gastroenterology

## 2024-02-12 DIAGNOSIS — K219 Gastro-esophageal reflux disease without esophagitis: Secondary | ICD-10-CM | POA: Insufficient documentation

## 2024-02-12 DIAGNOSIS — G8929 Other chronic pain: Secondary | ICD-10-CM | POA: Insufficient documentation

## 2024-02-12 DIAGNOSIS — R110 Nausea: Secondary | ICD-10-CM | POA: Insufficient documentation

## 2024-02-12 DIAGNOSIS — R748 Abnormal levels of other serum enzymes: Secondary | ICD-10-CM | POA: Diagnosis present

## 2024-02-12 DIAGNOSIS — K602 Anal fissure, unspecified: Secondary | ICD-10-CM | POA: Diagnosis present

## 2024-02-12 DIAGNOSIS — R1013 Epigastric pain: Secondary | ICD-10-CM | POA: Insufficient documentation

## 2024-02-12 DIAGNOSIS — K625 Hemorrhage of anus and rectum: Secondary | ICD-10-CM | POA: Diagnosis present

## 2024-02-17 ENCOUNTER — Ambulatory Visit: Payer: Self-pay | Admitting: Cardiology

## 2024-02-17 ENCOUNTER — Ambulatory Visit (HOSPITAL_COMMUNITY)
Admission: RE | Admit: 2024-02-17 | Discharge: 2024-02-17 | Disposition: A | Discharge status: Home / Self Care | Source: Ambulatory Visit | Attending: Cardiology | Admitting: Cardiology

## 2024-02-17 ENCOUNTER — Encounter (HOSPITAL_COMMUNITY): Payer: Self-pay

## 2024-02-17 ENCOUNTER — Other Ambulatory Visit: Payer: Self-pay | Admitting: Physician Assistant

## 2024-02-17 ENCOUNTER — Ambulatory Visit (HOSPITAL_BASED_OUTPATIENT_CLINIC_OR_DEPARTMENT_OTHER)
Admission: RE | Admit: 2024-02-17 | Discharge: 2024-02-17 | Disposition: A | Discharge status: Home / Self Care | Source: Ambulatory Visit | Attending: Cardiology | Admitting: Cardiology

## 2024-02-17 DIAGNOSIS — R079 Chest pain, unspecified: Secondary | ICD-10-CM | POA: Insufficient documentation

## 2024-02-17 LAB — NM MYOCAR MULTI W/SPECT W/WALL MOTION / EF
Base ST Depression (mm): 0 mm
LV dias vol: 115 mL (ref 62–150)
LV sys vol: 52 mL (ref 4.2–5.8)
MPHR: 158 {beats}/min
Nuc Stress EF: 55 %
Peak HR: 75 {beats}/min
Percent HR: 47 %
RATE: 0.3
Rest HR: 37 {beats}/min
Rest Nuclear Isotope Dose: 11 mCi
SDS: 4
SRS: 2
SSS: 6
ST Depression (mm): 0 mm
Stress Nuclear Isotope Dose: 32.3 mCi
TID: 1.2

## 2024-02-17 MED ORDER — SODIUM CHLORIDE FLUSH 0.9 % IV SOLN
INTRAVENOUS | Status: AC
  Administered 2024-02-17: 10 mL via INTRAVENOUS
  Filled 2024-02-17: qty 10

## 2024-02-17 MED ORDER — TECHNETIUM TC 99M TETROFOSMIN IV KIT
30.0000 | PACK | Freq: Once | INTRAVENOUS | Status: AC | PRN
  Administered 2024-02-17: 32.3 via INTRAVENOUS

## 2024-02-17 MED ORDER — TECHNETIUM TC 99M TETROFOSMIN IV KIT
10.0000 | PACK | Freq: Once | INTRAVENOUS | Status: AC | PRN
  Administered 2024-02-17: 11 via INTRAVENOUS

## 2024-02-17 MED ORDER — REGADENOSON 0.4 MG/5ML IV SOLN
INTRAVENOUS | Status: AC
  Administered 2024-02-17: 0.4 mg via INTRAVENOUS
  Filled 2024-02-17: qty 5

## 2024-02-17 NOTE — Progress Notes (Signed)
     Jeffrey Hooper presented for a Lexiscan  nuclear stress test today.  I Lorette CINDERELLA Kapur, PA-C, provided direct supervision and was present during the stress portion of the study today, which was completed without significant symptoms, immediate complications, or acute ST/T changes on ECG.  Stress imaging is pending at this time.  Preliminary ECG findings may be listed in the chart, but the stress test result will not be finalized until perfusion imaging is complete.  Lorette CINDERELLA Kapur, PA-C  02/17/2024, 2:07 PM

## 2024-02-21 ENCOUNTER — Telehealth: Payer: Self-pay | Admitting: Cardiology

## 2024-02-21 NOTE — Telephone Encounter (Signed)
 Patient confirmed that he is still having periodic chest pain and confirmed availability for an appointment. I called the patient and scheduled an appointment with the DOD provider, Dr. Mona on 02/26/24 at 10:20 am

## 2024-02-21 NOTE — Telephone Encounter (Signed)
 I called the patient with the stress test results he requested. Patient understood the results and was told that a scheduler will get into contact with him to schedule his 3 month appointment. Patient verbalized that he is still having chest pain. It has not increased, no other symptoms have shown over the past 2 weeks or since the stress test was performed. He denies any other cardiac symptoms such as dizziness, pre-syncope, syncope, or radiating pain. I advised to continue his medications, stay hydrated and if his symptoms worsen or he is having any shortness of breath, to let us  know. Will forward to Dr. Lavona and covering nurse to make them aware of ongoing chest discomfort and to follow up with additional recommendations.

## 2024-02-21 NOTE — Telephone Encounter (Signed)
 Follow Up:    Patient says he needs his Stress Test results explained to him please.

## 2024-02-26 ENCOUNTER — Encounter (HOSPITAL_COMMUNITY)
Admission: RE | Disposition: A | Discharge status: Home / Self Care | Payer: Self-pay | Source: Ambulatory Visit | Attending: Cardiology

## 2024-02-26 ENCOUNTER — Ambulatory Visit (HOSPITAL_COMMUNITY)
Admission: RE | Admit: 2024-02-26 | Discharge: 2024-02-26 | Disposition: A | Discharge status: Home / Self Care | Source: Ambulatory Visit | Attending: Cardiology | Admitting: Cardiology

## 2024-02-26 ENCOUNTER — Encounter: Payer: Self-pay | Admitting: Internal Medicine

## 2024-02-26 ENCOUNTER — Other Ambulatory Visit: Payer: Self-pay

## 2024-02-26 ENCOUNTER — Ambulatory Visit (INDEPENDENT_AMBULATORY_CARE_PROVIDER_SITE_OTHER): Admitting: Internal Medicine

## 2024-02-26 VITALS — BP 110/62 | HR 50 | Ht 70.0 in | Wt 210.0 lb

## 2024-02-26 DIAGNOSIS — I200 Unstable angina: Secondary | ICD-10-CM

## 2024-02-26 DIAGNOSIS — Z87891 Personal history of nicotine dependence: Secondary | ICD-10-CM | POA: Diagnosis not present

## 2024-02-26 DIAGNOSIS — Z955 Presence of coronary angioplasty implant and graft: Secondary | ICD-10-CM | POA: Diagnosis not present

## 2024-02-26 DIAGNOSIS — I10 Essential (primary) hypertension: Secondary | ICD-10-CM | POA: Insufficient documentation

## 2024-02-26 DIAGNOSIS — R9439 Abnormal result of other cardiovascular function study: Secondary | ICD-10-CM

## 2024-02-26 DIAGNOSIS — E785 Hyperlipidemia, unspecified: Secondary | ICD-10-CM | POA: Diagnosis not present

## 2024-02-26 DIAGNOSIS — Z01812 Encounter for preprocedural laboratory examination: Secondary | ICD-10-CM | POA: Diagnosis not present

## 2024-02-26 DIAGNOSIS — Z79899 Other long term (current) drug therapy: Secondary | ICD-10-CM | POA: Insufficient documentation

## 2024-02-26 DIAGNOSIS — I2510 Atherosclerotic heart disease of native coronary artery without angina pectoris: Secondary | ICD-10-CM | POA: Diagnosis not present

## 2024-02-26 DIAGNOSIS — R931 Abnormal findings on diagnostic imaging of heart and coronary circulation: Secondary | ICD-10-CM | POA: Insufficient documentation

## 2024-02-26 DIAGNOSIS — I25118 Atherosclerotic heart disease of native coronary artery with other forms of angina pectoris: Secondary | ICD-10-CM | POA: Insufficient documentation

## 2024-02-26 HISTORY — PX: LEFT HEART CATH AND CORONARY ANGIOGRAPHY: CATH118249

## 2024-02-26 LAB — BASIC METABOLIC PANEL WITH GFR
Anion gap: 13 (ref 5–15)
BUN: 18 mg/dL (ref 8–23)
CO2: 25 mmol/L (ref 22–32)
Calcium: 9.3 mg/dL (ref 8.9–10.3)
Chloride: 100 mmol/L (ref 98–111)
Creatinine, Ser: 1.14 mg/dL (ref 0.61–1.24)
GFR, Estimated: >60 mL/min (ref >60)
Glucose, Bld: 139 mg/dL — ABNORMAL HIGH (ref 70–99)
Potassium: 3.6 mmol/L (ref 3.5–5.1)
Sodium: 138 mmol/L (ref 135–145)

## 2024-02-26 LAB — CBC
HCT: 41.3 % (ref 39.0–52.0)
Hemoglobin: 14.1 g/dL (ref 13.0–17.0)
MCH: 31.5 pg (ref 26.0–34.0)
MCHC: 34.1 g/dL (ref 30.0–36.0)
MCV: 92.2 fL (ref 80.0–100.0)
Platelets: 200 K/uL (ref 150–400)
RBC: 4.48 MIL/uL (ref 4.22–5.81)
RDW: 13.7 % (ref 11.5–15.5)
WBC: 6.8 K/uL (ref 4.0–10.5)
nRBC: 0 % (ref 0.0–0.2)

## 2024-02-26 SURGERY — LEFT HEART CATH AND CORONARY ANGIOGRAPHY
Anesthesia: LOCAL

## 2024-02-26 MED ORDER — IOHEXOL 350 MG/ML SOLN
INTRAVENOUS | Status: DC | PRN
  Administered 2024-02-26: 60 mL

## 2024-02-26 MED ORDER — SODIUM CHLORIDE 0.9% FLUSH: 3.0000 mL | Freq: Two times a day (BID) | INTRAVENOUS | Status: DC

## 2024-02-26 MED ORDER — METOPROLOL SUCCINATE ER 25 MG PO TB24: 25.0000 mg | ORAL_TABLET | Freq: Every day | ORAL | 11 refills | Status: DC

## 2024-02-26 MED ORDER — SODIUM CHLORIDE 0.9 % IV SOLN: 250.0000 mL | INTRAVENOUS | Status: DC | PRN

## 2024-02-26 MED ORDER — VERAPAMIL HCL 2.5 MG/ML IV SOLN
INTRAVENOUS | Status: AC
  Filled 2024-02-26: qty 2

## 2024-02-26 MED ORDER — ACETAMINOPHEN 325 MG PO TABS: 650.0000 mg | ORAL_TABLET | ORAL | Status: DC | PRN

## 2024-02-26 MED ORDER — MIDAZOLAM HCL 2 MG/2ML IJ SOLN
INTRAMUSCULAR | Status: AC
  Filled 2024-02-26: qty 2

## 2024-02-26 MED ORDER — HEPARIN (PORCINE) IN NACL 2-0.9 UNITS/ML
INTRAMUSCULAR | Status: DC | PRN
  Administered 2024-02-26: 10 mL via INTRA_ARTERIAL

## 2024-02-26 MED ORDER — FENTANYL CITRATE (PF) 100 MCG/2ML IJ SOLN
INTRAMUSCULAR | Status: DC | PRN
  Administered 2024-02-26: 25 ug via INTRAVENOUS

## 2024-02-26 MED ORDER — ASPIRIN 81 MG PO CHEW
CHEWABLE_TABLET | ORAL | Status: AC
  Administered 2024-02-26: 81 mg via ORAL
  Filled 2024-02-26: qty 1

## 2024-02-26 MED ORDER — SODIUM CHLORIDE 0.9 % WEIGHT BASED INFUSION: 3.0000 mL/kg/h | INTRAVENOUS | Status: AC

## 2024-02-26 MED ORDER — FENTANYL CITRATE (PF) 100 MCG/2ML IJ SOLN
INTRAMUSCULAR | Status: AC
  Filled 2024-02-26: qty 2

## 2024-02-26 MED ORDER — SODIUM CHLORIDE 0.9% FLUSH: 3.0000 mL | INTRAVENOUS | Status: DC | PRN

## 2024-02-26 MED ORDER — LIDOCAINE HCL (PF) 1 % IJ SOLN
INTRAMUSCULAR | Status: AC
  Filled 2024-02-26: qty 30

## 2024-02-26 MED ORDER — MIDAZOLAM HCL 2 MG/2ML IJ SOLN
INTRAMUSCULAR | Status: DC | PRN
Start: 2024-02-26 — End: 2024-02-26
  Administered 2024-02-26: 1 mg via INTRAVENOUS

## 2024-02-26 MED ORDER — LIDOCAINE HCL (PF) 1 % IJ SOLN
INTRAMUSCULAR | Status: DC | PRN
  Administered 2024-02-26: 2 mL

## 2024-02-26 MED ORDER — ISOSORBIDE MONONITRATE ER 30 MG PO TB24: 30.0000 mg | ORAL_TABLET | Freq: Every day | ORAL | 11 refills | Status: DC

## 2024-02-26 MED ORDER — SODIUM CHLORIDE 0.9 % WEIGHT BASED INFUSION: 1.0000 mL/kg/h | INTRAVENOUS | Status: DC

## 2024-02-26 MED ORDER — ASPIRIN 81 MG PO CHEW: 81.0000 mg | CHEWABLE_TABLET | ORAL | Status: AC

## 2024-02-26 MED ORDER — SODIUM CHLORIDE 0.9 % IV SOLN
INTRAVENOUS | Status: DC | PRN
  Administered 2024-02-26: 1000 mL

## 2024-02-26 MED ORDER — ONDANSETRON HCL 4 MG/2ML IJ SOLN: 4.0000 mg | Freq: Four times a day (QID) | INTRAMUSCULAR | Status: DC | PRN

## 2024-02-26 SURGICAL SUPPLY — 9 items
CATH 5FR JL3.5 JR4 ANG PIG MP (CATHETERS) IMPLANT
DEVICE RAD COMP TR BAND LRG (VASCULAR PRODUCTS) IMPLANT
ELECT DEFIB PAD ADLT CADENCE (PAD) IMPLANT
GLIDESHEATH SLEND SS 6F .021 (SHEATH) IMPLANT
GUIDEWIRE .025 260CM (WIRE) IMPLANT
GUIDEWIRE INQWIRE 1.5J.035X260 (WIRE) IMPLANT
PACK CARDIAC CATHETERIZATION (CUSTOM PROCEDURE TRAY) ×1 IMPLANT
SET ATX-X65L (MISCELLANEOUS) IMPLANT
SHEATH PROBE COVER 6X72 (BAG) IMPLANT

## 2024-02-26 NOTE — Discharge Instructions (Addendum)
 Stop HCT  Add Toprol  XL 25 mg daily and Imdur  30 mg daily.  Drink plenty of fluid for 48 hours and keep wrist elevated at heart level for 24 hours  Radial Site Care   This sheet gives you information about how to care for yourself after your procedure. Your health care provider may also give you more specific instructions. If you have problems or questions, contact your health care provider. What can I expect after the procedure? After the procedure, it is common to have: Bruising and tenderness at the catheter insertion area. Follow these instructions at home: Medicines Take over-the-counter and prescription medicines only as told by your health care provider. Insertion site care Follow instructions from your health care provider about how to take care of your insertion site. Make sure you: Wash your hands with soap and water before you change your bandage (dressing). If soap and water are not available, use hand sanitizer. remove your dressing as told by your health care provider. In 24 hours Check your insertion site every day for signs of infection. Check for: Redness, swelling, or pain. Fluid or blood. Pus or a bad smell. Warmth. Do not take baths, swim, or use a hot tub until your health care provider approves. You may shower 24-48 hours after the procedure, or as directed by your health care provider. Remove the dressing and gently wash the site with plain soap and water. Pat the area dry with a clean towel. Do not rub the site. That could cause bleeding. Do not apply powder or lotion to the site. Activity   For 24 hours after the procedure, or as directed by your health care provider: Do not flex or bend the affected arm. Do not push or pull heavy objects with the affected arm. Do not drive yourself home from the hospital or clinic. You may drive 24 hours after the procedure unless your health care provider tells you not to. Do not operate machinery or power tools. Do  not lift anything that is heavier than 10 lb (4.5 kg), or the limit that you are told, until your health care provider says that it is safe. For 4 days Ask your health care provider when it is okay to: Return to work or school. Resume usual physical activities or sports. Resume sexual activity. General instructions If the catheter site starts to bleed, raise your arm and put firm pressure on the site. If the bleeding does not stop, get help right away. This is a medical emergency. If you went home on the same day as your procedure, a responsible adult should be with you for the first 24 hours after you arrive home. Keep all follow-up visits as told by your health care provider. This is important. Contact a health care provider if: You have a fever. You have redness, swelling, or yellow drainage around your insertion site. Get help right away if: You have unusual pain at the radial site. The catheter insertion area swells very fast. The insertion area is bleeding, and the bleeding does not stop when you hold steady pressure on the area. Your arm or hand becomes pale, cool, tingly, or numb. These symptoms may represent a serious problem that is an emergency. Do not wait to see if the symptoms will go away. Get medical help right away. Call your local emergency services (911 in the [place] Do not drive yourself to the hospital. Summary After the procedure, it is common to have bruising and tenderness at the site.  Follow instructions from your health care provider about how to take care of your radial site wound. Check the wound every day for signs of infection. Do not lift anything that is heavier than 10 lb (4.5 kg), or the limit that you are told, until your health care provider says that it is safe. This information is not intended to replace advice given to you by your health care provider. Make sure you discuss any questions you have with your health care provider. Document Revised:  08/28/2017 Document Reviewed: 08/28/2017 Elsevier Patient Education  2020 [employer].

## 2024-02-26 NOTE — Progress Notes (Signed)
 Patient and wife was given discharge instructions. Both verbalized understanding.

## 2024-02-26 NOTE — Patient Instructions (Signed)
 Medication Instructions:  Your physician recommends that you continue on your current medications as directed. Please refer to the Current Medication list given to you today.  *If you need a refill on your cardiac medications before your next appointment, please call your pharmacy*  Lab Work: TODAY: CBC, BMET If you have labs (blood work) drawn today and your tests are completely normal, you will receive your results only by: MyChart Message (if you have MyChart) OR A paper copy in the mail If you have any lab test that is abnormal or we need to change your treatment, we will call you to review the results.  Testing/Procedures: Your physician has requested that you have a cardiac catheterization. Cardiac catheterization is used to diagnose and/or treat various heart conditions. Doctors may recommend this procedure for a number of different reasons. The most common reason is to evaluate chest pain. Chest pain can be a symptom of coronary artery disease (CAD), and cardiac catheterization can show whether plaque is narrowing or blocking your heart's arteries. This procedure is also used to evaluate the valves, as well as measure the blood flow and oxygen levels in different parts of your heart. For further information please visit [email or web address] Please follow instruction sheet, as given. SEE INSTRUCTIONS BELOW  Follow-Up: At [facility], you and your health needs are our priority.  As part of our continuing mission to provide you with exceptional heart care, our providers are all part of one team.  This team includes your primary Cardiologist (physician) and Advanced Practice Providers or APPs (Physician Assistants and Nurse Practitioners) who all work together to provide you with the care you need, when you need it.  Your next appointment:   2-4 weeks after cath  Provider:   Lynwood Schilling, MD or One of our Advanced Practice Providers (APPs): Morse Clause, PA-C  Lamarr Satterfield,  NP Miriam Shams, NP  Olivia Pavy, PA-C Josefa Beauvais, NP  Leontine Salen, PA-C Orren Fabry, PA-C  Sheldon, PA-C Ernest Dick, NP  Damien Braver, NP Jon Hails, PA-C  Waddell Donath, PA-C    Dayna Dunn, PA-C  Scott Weaver, PA-C Lum Louis, NP Katlyn West, NP Callie Goodrich, PA-C  Evan Williams, PA-C Sheng Haley, PA-C  Xika Zhao, NP Kathleen Johnson, PA-C    We recommend signing up for the patient portal called MyChart.  Sign up information is provided on this After Visit Summary.  MyChart is used to connect with patients for Virtual Visits (Telemedicine).  Patients are able to view lab/test results, encounter notes, upcoming appointments, etc.  Non-urgent messages can be sent to your provider as well.   To learn more about what you can do with MyChart, go to [email or web address].   Other Instructions  Jeffrey Hooper HEARTCARE A DEPT OF [place] HEARTC[facility] AT [address] A DEPT OF THE [place] [address] [place] [address] Dept: [phone] Loc: [phone]  Jeffrey Hooper  02/26/2024  You are scheduled for a Cardiac Catheterization on Tuesday, July 29 with Dr. Alm Hooper.  1. Please arrive at the [facility] (Main Entrance A) at [facility]: [address] [place], [place] [address] at 7:00 AM (This time is 2 hour(s) before your procedure to ensure your preparation).   Free valet parking service is available. You will check in at ADMITTING. The support person will be asked to wait in the waiting room.  It is OK to have someone drop you off and come  back when you are ready to be discharged.    Special note: Every effort is made to have your procedure done on time. Please understand that emergencies sometimes delay scheduled procedures.  2. Diet: Do not eat solid foods after midnight.  The patient may have clear liquids until 5am upon the day of the procedure.  3. Labs: You will need to have blood drawn on  today  4. Medication instructions in preparation for your procedure:   Contrast Allergy : No  HOLD HYDROCHLOROTHIAZIDE   the morning of the procedure   On the morning of your procedure, take your Aspirin  81 mg and any morning medicines NOT listed above.  You may use sips of water.  5. Plan to go home the same day, you will only stay overnight if medically necessary. 6. Bring a current list of your medications and current insurance cards. 7. You MUST have a responsible person to drive you home. 8. Someone MUST be with you the first 24 hours after you arrive home or your discharge will be delayed. 9. Please wear clothes that are easy to get on and off and wear slip-on shoes.  Thank you for allowing us  to care for you!   -- [facility]

## 2024-02-26 NOTE — H&P (View-Only) (Signed)
 OFFICE NOTE  Chief Complaint:  Chest pain  Primary Care Physician: [name], DO  HPI:  Jeffrey Hooper is a 63 y.o. male patient followed by Dr. Lavona, with a past medial history significant for coronary artery disease with catheterization in 2017 that showed 95% stenosis of the mid LAD with successful angioplasty improving the stenosis down to 15%.  He has done fairly well since that time and had had some recurrent chest pain in 2021 but had a negative Myoview  at that time.  Recently he has had some worsening chest discomfort, nausea and fatigue as well as unexplained bradycardia.  In fact during his stress test his heart rate was down in the 30s.  Today his heart rates in the 50s with what appears to be a sinus bradycardia.  He is on no negative inotropic medications.  He underwent recent stress testing as mentioned which shows an inferior perfusion defect.  Unfortunately there was some bowel attenuation artifact however does appear to me on my personal review of the images that there is somewhat more ischemia on the stress images compared to the rest images.  This could suggest a severe blockage which could also cause resting ischemia.  This finding would seem to fit his picture of presentation including diaphoresis and nausea as well as bradycardia implicating a possible right coronary artery stenosis.  He reports today that his chest pain symptoms continue.  He was added onto the DOD schedule today for further evaluation.  PMHx:  Past Medical History:  Diagnosis Date   Allergy     Allergy  to alpha-gal    Anxiety    Arthritis    CAD (coronary artery disease), native coronary artery 12/03/2015   Coronary artery disease    Dysrhythmia    irregular and PAC'S   GERD (gastroesophageal reflux disease)    Head injury with loss of consciousness (HCC)    back in the 1980's   History of kidney stones    Hyperlipidemia    Hypertension    Hypothyroid    Multiple benign nevi     Neuromuscular disorder (HCC) 2011   LEFT RADIAL NERVE SURGERY R/T TRAUMA   Pneumonia 2014, 2017   Prostatism    Pulmonary nodule    Spinal cord stimulator status    told to bring remote to surgery    Past Surgical History:  Procedure Laterality Date   BACK SURGERY     T12 - L1, 11 times including neck   CARDIAC CATHETERIZATION N/A 12/02/2015   Procedure: Left Heart Cath and Coronary Angiography;  Surgeon: Peter M Swaziland, MD;  Location: [facility] INVASIVE CV LAB;  Service: Cardiovascular;  Laterality: N/A;   CARDIAC CATHETERIZATION N/A 12/02/2015   Procedure: Coronary Stent Intervention;  Surgeon: Peter M Swaziland, MD;  Location: [facility] INVASIVE CV LAB;  Service: Cardiovascular;  Laterality: N/A;  mid LAD Promus 2.5x12   CERVICAL FUSION  2023   C4 and C5   COLONOSCOPY     CORONARY ANGIOPLASTY     one stent placed by Dr. P Swaziland   HARDWARE REMOVAL Right 08/26/2017   Procedure: Right Lumbar Five Revision of pedicle screw with Removal of Lumbar Five Screw;  Surgeon: Colon Shove, MD;  Location: [facility] OR;  Service: Neurosurgery;  Laterality: Right;  posterior   HARDWARE REMOVAL Right 12/27/2017   Procedure: Right Lumbar Two, Lumbar Three, Lumbar Four Pedicle screw removal with metrex;  Surgeon: Colon Shove, MD;  Location: [facility] OR;  Service: Neurosurgery;  Laterality: Right;  Right L2 to L4 Pedicle screw removal with mterex   HEMORRHOID BANDING     HERNIA REPAIR     LAPAROSCOPY N/A 09/01/2020   Procedure: LAPAROSCOPY DIAGNOSTIC;  Surgeon: Debby Hila, MD;  Location: [facility] ORS;  Service: General;  Laterality: N/A;   MASS EXCISION  06/25/2012   Procedure: EXCISION MASS;  Surgeon: Arley JONELLE Curia, MD;  Location: [facility];  Service: Orthopedics;  Laterality: Left;  transection of NEUROMA, BURYING RADIAL NERVE IN BRACHIORADIALIS LEFT SIDE   radial nerve Left    cut in work injury   RIGHT GREAT TOENAIL REMOVAL  04/2020   SPINAL FUSION     C6-7   TONSILLECTOMY  1982   UMBILICAL HERNIA  REPAIR N/A 09/01/2020   Procedure: PRIMARY UMBILICAL HERNIA REPAIR;  Surgeon: Debby Hila, MD;  Location: [facility] ORS;  Service: General;  Laterality: N/A;   VASECTOMY      FAMHx:  Family History  Problem Relation Age of Onset   Heart failure Mother 75       Died age 58   Congestive Heart Failure Mother    Heart attack Father 8       Died with MI   Heart attack Paternal Grandmother    Heart attack Paternal Grandfather    Colon cancer Neg Hx    Esophageal cancer Neg Hx    Pancreatic cancer Neg Hx    Stomach cancer Neg Hx     SOCHx:   reports that he quit smoking about 17 years ago. His smoking use included cigarettes. He started smoking about 37 years ago. He has a 20 pack-year smoking history. He has never used smokeless tobacco. He reports that he does not currently use alcohol. He reports that he does not use drugs.  ALLERGIES:  Allergies  Allergen Reactions   Alpha-Gal Rash and Other (See Comments)    SEVERE ALLERGY  TO ANY MEAT OR MEAT DERIVED PRODUCTS FROM 4 LEGGED ANIMALS > > BEEF, PORK , GOATS, DEER, ETC. < < RESULT OF BITE FROM LONE STAR TICK   Beef (Bovine) Protein Rash and Other (See Comments)   Beef-Derived Drug Products Rash and Other (See Comments)   Lambs Quarters Rash and Other (See Comments)   Pork-Derived Products Rash and Other (See Comments)   Prednisone  Other (See Comments)    im depo medrol  caused dizziness - pt passed out. Ask pt before giving. Pt can take oral prednisone     Brilinta  [Ticagrelor ] Hives    Pt ate pork on the same day he took Brilinta  before knowing he had alpha gal. May have been the alpha gal reaction, pt is unsure.    Doxycycline Hives   Gabapentin  Swelling   Lyrica [Pregabalin]     Dry mouth and felt raw   Methylprednisolone  Sodium Succ     im depo medrol  caused dizziness - pt passed out. Ask pt before giving. Issue occurred when administered in the left arm    Penicillins Hives   Shellfish Allergy  Hives   Adhesive [Tape] Rash    Fentanyl  Rash    Reaction to adhesive, not the drug     ROS: Pertinent items noted in HPI and remainder of comprehensive ROS otherwise negative.  HOME MEDS: Current Outpatient Medications on File Prior to Visit  Medication Sig Dispense Refill   acetaminophen  (TYLENOL ) 500 MG tablet Take 1,000 mg by mouth daily as needed for mild pain or headache.      albuterol  (VENTOLIN  HFA) 108 (90 Base) MCG/ACT inhaler TAKE  2 PUFFS BY MOUTH EVERY 6 HOURS AS NEEDED FOR WHEEZE OR SHORTNESS OF BREATH 18 each 1   AMBULATORY NON FORMULARY MEDICATION Diltiazem 2%/lidocaine  5% apply pea sized amount three times daily for 2 months 30 g 0   aspirin  EC 81 MG tablet Take 81 mg by mouth at bedtime.     atorvastatin  (LIPITOR ) 80 MG tablet TAKE 1 TABLET BY MOUTH EVERYDAY AT BEDTIME 90 tablet 3   betamethasone  dipropionate 0.05 % cream Apply 1 Application topically 2 (two) times daily as needed (swelling/ itching).     cetirizine  (ZYRTEC ) 10 MG tablet Take 10 mg by mouth daily.     Cyanocobalamin  (B-12 COMPLIANCE INJECTION) 1000 MCG/ML KIT Inject 1,000 mcg as directed every 30 (thirty) days.     EPINEPHrine  0.3 mg/0.3 mL IJ SOAJ injection Inject 0.3 mg into the muscle as needed for anaphylaxis. 2 each 0   esomeprazole  (NEXIUM ) 40 MG capsule Take 1 capsule (40 mg total) by mouth 2 (two) times daily before a meal. 60 capsule 2   fluticasone  (FLONASE ) 50 MCG/ACT nasal spray Place 2 sprays into both nostrils daily. 48 g 1   hydrochlorothiazide  (HYDRODIURIL ) 25 MG tablet Take 1 tablet (25 mg total) by mouth daily. 30 tablet 2   HYDROcodone -acetaminophen  (NORCO/VICODIN) 5-325 MG tablet Take 1 tablet by mouth every 6 (six) hours as needed for moderate pain (pain score 4-6).     levothyroxine  (SYNTHROID ) 100 MCG tablet Take 1 tablet (100 mcg total) by mouth daily. 90 tablet 3   nitroGLYCERIN  (NITROSTAT ) 0.4 MG SL tablet Place 1 tablet (0.4 mg total) under the tongue every 5 (five) minutes as needed for chest pain. 25 tablet 3    olmesartan  (BENICAR ) 40 MG tablet Take 1 tablet (40 mg total) by mouth daily. 90 tablet 2   ondansetron  (ZOFRAN -ODT) 4 MG disintegrating tablet TAKE 1 TABLET BY MOUTH EVERY 8 HOURS AS NEEDED FOR NAUSEA AND VOMITING 20 tablet 0   PARoxetine  (PAXIL ) 20 MG tablet Take 1 tablet (20 mg total) by mouth daily. 90 tablet 3   promethazine  (PHENERGAN ) 25 MG tablet TAKE 0.5-1 TABLETS (12.5-25 MG TOTAL) BY MOUTH 2 (TWO) TIMES DAILY AS NEEDED FOR NAUSEA OR VOMITING. 45 tablet 1   [Paused] meloxicam  (MOBIC ) 7.5 MG tablet Take 7.5 mg by mouth at bedtime. (Patient not taking: Reported on 02/26/2024)     Current Facility-Administered Medications on File Prior to Visit  Medication Dose Route Frequency Provider Last Rate Last Admin   cyanocobalamin  (VITAMIN B12) injection 1,000 mcg  1,000 mcg Intramuscular Q30 days Jolinda Potter M, DO   1,000 mcg at 02/03/24 0911    LABS/IMAGING: No results found for this or any previous visit (from the past 48 hours). No results found.  LIPID PANEL:    Component Value Date/Time   CHOL 142 12/23/2023 2325   CHOL 145 08/30/2023 0941   CHOL 136 10/29/2012 0922   TRIG 133 12/23/2023 2325   TRIG 139 04/28/2013 1505   TRIG 103 10/29/2012 0922   HDL 46 12/23/2023 2325   HDL 45 08/30/2023 0941   HDL 44 04/28/2013 1505   HDL 41 10/29/2012 0922   CHOLHDL 3.1 12/23/2023 2325   VLDL 27 12/23/2023 2325   LDLCALC 69 12/23/2023 2325   LDLCALC 73 08/30/2023 0941   LDLCALC 50 04/28/2013 1505   LDLCALC 74 10/29/2012 0922     WEIGHTS: Wt Readings from Last 3 Encounters:  02/26/24 210 lb (95.3 kg)  02/05/24 213 lb (96.6 kg)  01/21/24 216  lb (98 kg)    VITALS: BP 110/62   Pulse (!) 50   Ht 5' 10 (1.778 m)   Wt 210 lb (95.3 kg)   SpO2 95%   BMI 30.13 kg/m   EXAM: General appearance: alert and no distress Lungs: clear to auscultation bilaterally Heart: Regular bradycardia Extremities: extremities normal, atraumatic, no cyanosis or edema Neurologic: Grossly  normal  EKG: EKG Interpretation Date/Time:  Wednesday February 26 2024 10:40:10 EDT Ventricular Rate:  50 PR Interval:  184 QRS Duration:  94 QT Interval:  452 QTC Calculation: 412 R Axis:   12  Text Interpretation: Sinus bradycardia When compared with ECG of 23-Dec-2023 23:15, No significant change was found Confirmed by Mona Kent [phone]) on 02/26/2024 10:50:45 AM - personally reviewed  ASSESSMENT: Unstable angina Abnormal Myoview  suggestive of reversible inferior ischemia CAD status post PCI to the LAD in 2017 with residual 30% proximal to mid RCA stenosis Hypertension Dyslipidemia, goal LDL less than 70  PLAN: 1.   Mr. Schultes has been describing unstable angina with symptoms of chest pain, worsening fatigue, diaphoresis and nausea and was noted to have an abnormal Myoview  with possible reversible inferior ischemia.  There was some bowel attenuation artifact however constellation of symptoms including the bradycardia with heart rates as low as in the 30s is certainly concerning for RCA ischemia.  He is on no AV nodal blocking agents.  Since he remains symptomatic, I would suggest a definitive cardiac catheterization.  I did discuss the risk, benefits and alternatives graded catheterization today and he is agreeable to proceed.  I we will get updated lab work and try to arrange that for tomorrow or Friday.  Informed Consent   Shared Decision Making/Informed Consent The risks [stroke (1 in 1000), death (1 in 1000), kidney failure [usually temporary] (1 in 500), bleeding (1 in 200), allergic reaction [possibly serious] (1 in 200)], benefits (diagnostic support and management of coronary artery disease) and alternatives of a cardiac catheterization were discussed in detail with Mr. Trull and he is willing to proceed.    Plan follow-up with Dr. Lavona after cath.  Kent KYM Mona, MD, [facility], FNLA, FACP  [place]  [facility]  Medical Director of the Advanced Lipid Disorders &   Cardiovascular Risk Reduction Clinic Diplomate of the American Board of Clinical Lipidology Attending Cardiologist  Direct Dial: [phone]  Fax: [phone]  Website:  [email or web address] Kent BROCKS Countess Biebel 02/26/2024, 11:02 AM

## 2024-02-26 NOTE — Addendum Note (Signed)
 Addended by: VICCI ROXIE CROME on: 02/26/2024 12:43 PM   Modules accepted: Orders

## 2024-02-26 NOTE — Progress Notes (Signed)
 OFFICE NOTE  Chief Complaint:  Chest pain  Primary Care Physician: [name], DO  HPI:  Jeffrey Hooper is a 63 y.o. male patient followed by Dr. Lavona, with a past medial history significant for coronary artery disease with catheterization in 2017 that showed 95% stenosis of the mid LAD with successful angioplasty improving the stenosis down to 15%.  He has done fairly well since that time and had had some recurrent chest pain in 2021 but had a negative Myoview  at that time.  Recently he has had some worsening chest discomfort, nausea and fatigue as well as unexplained bradycardia.  In fact during his stress test his heart rate was down in the 30s.  Today his heart rates in the 50s with what appears to be a sinus bradycardia.  He is on no negative inotropic medications.  He underwent recent stress testing as mentioned which shows an inferior perfusion defect.  Unfortunately there was some bowel attenuation artifact however does appear to me on my personal review of the images that there is somewhat more ischemia on the stress images compared to the rest images.  This could suggest a severe blockage which could also cause resting ischemia.  This finding would seem to fit his picture of presentation including diaphoresis and nausea as well as bradycardia implicating a possible right coronary artery stenosis.  He reports today that his chest pain symptoms continue.  He was added onto the DOD schedule today for further evaluation.  PMHx:  Past Medical History:  Diagnosis Date   Allergy     Allergy  to alpha-gal    Anxiety    Arthritis    CAD (coronary artery disease), native coronary artery 12/03/2015   Coronary artery disease    Dysrhythmia    irregular and PAC'S   GERD (gastroesophageal reflux disease)    Head injury with loss of consciousness (HCC)    back in the 1980's   History of kidney stones    Hyperlipidemia    Hypertension    Hypothyroid    Multiple benign nevi     Neuromuscular disorder (HCC) 2011   LEFT RADIAL NERVE SURGERY R/T TRAUMA   Pneumonia 2014, 2017   Prostatism    Pulmonary nodule    Spinal cord stimulator status    told to bring remote to surgery    Past Surgical History:  Procedure Laterality Date   BACK SURGERY     T12 - L1, 11 times including neck   CARDIAC CATHETERIZATION N/A 12/02/2015   Procedure: Left Heart Cath and Coronary Angiography;  Surgeon: Peter M Swaziland, MD;  Location: [facility] INVASIVE CV LAB;  Service: Cardiovascular;  Laterality: N/A;   CARDIAC CATHETERIZATION N/A 12/02/2015   Procedure: Coronary Stent Intervention;  Surgeon: Peter M Swaziland, MD;  Location: [facility] INVASIVE CV LAB;  Service: Cardiovascular;  Laterality: N/A;  mid LAD Promus 2.5x12   CERVICAL FUSION  2023   C4 and C5   COLONOSCOPY     CORONARY ANGIOPLASTY     one stent placed by Dr. P Swaziland   HARDWARE REMOVAL Right 08/26/2017   Procedure: Right Lumbar Five Revision of pedicle screw with Removal of Lumbar Five Screw;  Surgeon: Colon Shove, MD;  Location: [facility] OR;  Service: Neurosurgery;  Laterality: Right;  posterior   HARDWARE REMOVAL Right 12/27/2017   Procedure: Right Lumbar Two, Lumbar Three, Lumbar Four Pedicle screw removal with metrex;  Surgeon: Colon Shove, MD;  Location: [facility] OR;  Service: Neurosurgery;  Laterality: Right;  Right L2 to L4 Pedicle screw removal with mterex   HEMORRHOID BANDING     HERNIA REPAIR     LAPAROSCOPY N/A 09/01/2020   Procedure: LAPAROSCOPY DIAGNOSTIC;  Surgeon: Debby Hila, MD;  Location: [facility] ORS;  Service: General;  Laterality: N/A;   MASS EXCISION  06/25/2012   Procedure: EXCISION MASS;  Surgeon: Arley JONELLE Curia, MD;  Location: [facility];  Service: Orthopedics;  Laterality: Left;  transection of NEUROMA, BURYING RADIAL NERVE IN BRACHIORADIALIS LEFT SIDE   radial nerve Left    cut in work injury   RIGHT GREAT TOENAIL REMOVAL  04/2020   SPINAL FUSION     C6-7   TONSILLECTOMY  1982   UMBILICAL HERNIA  REPAIR N/A 09/01/2020   Procedure: PRIMARY UMBILICAL HERNIA REPAIR;  Surgeon: Debby Hila, MD;  Location: [facility] ORS;  Service: General;  Laterality: N/A;   VASECTOMY      FAMHx:  Family History  Problem Relation Age of Onset   Heart failure Mother 75       Died age 58   Congestive Heart Failure Mother    Heart attack Father 8       Died with MI   Heart attack Paternal Grandmother    Heart attack Paternal Grandfather    Colon cancer Neg Hx    Esophageal cancer Neg Hx    Pancreatic cancer Neg Hx    Stomach cancer Neg Hx     SOCHx:   reports that he quit smoking about 17 years ago. His smoking use included cigarettes. He started smoking about 37 years ago. He has a 20 pack-year smoking history. He has never used smokeless tobacco. He reports that he does not currently use alcohol. He reports that he does not use drugs.  ALLERGIES:  Allergies  Allergen Reactions   Alpha-Gal Rash and Other (See Comments)    SEVERE ALLERGY  TO ANY MEAT OR MEAT DERIVED PRODUCTS FROM 4 LEGGED ANIMALS > > BEEF, PORK , GOATS, DEER, ETC. < < RESULT OF BITE FROM LONE STAR TICK   Beef (Bovine) Protein Rash and Other (See Comments)   Beef-Derived Drug Products Rash and Other (See Comments)   Lambs Quarters Rash and Other (See Comments)   Pork-Derived Products Rash and Other (See Comments)   Prednisone  Other (See Comments)    im depo medrol  caused dizziness - pt passed out. Ask pt before giving. Pt can take oral prednisone     Brilinta  [Ticagrelor ] Hives    Pt ate pork on the same day he took Brilinta  before knowing he had alpha gal. May have been the alpha gal reaction, pt is unsure.    Doxycycline Hives   Gabapentin  Swelling   Lyrica [Pregabalin]     Dry mouth and felt raw   Methylprednisolone  Sodium Succ     im depo medrol  caused dizziness - pt passed out. Ask pt before giving. Issue occurred when administered in the left arm    Penicillins Hives   Shellfish Allergy  Hives   Adhesive [Tape] Rash    Fentanyl  Rash    Reaction to adhesive, not the drug     ROS: Pertinent items noted in HPI and remainder of comprehensive ROS otherwise negative.  HOME MEDS: Current Outpatient Medications on File Prior to Visit  Medication Sig Dispense Refill   acetaminophen  (TYLENOL ) 500 MG tablet Take 1,000 mg by mouth daily as needed for mild pain or headache.      albuterol  (VENTOLIN  HFA) 108 (90 Base) MCG/ACT inhaler TAKE  2 PUFFS BY MOUTH EVERY 6 HOURS AS NEEDED FOR WHEEZE OR SHORTNESS OF BREATH 18 each 1   AMBULATORY NON FORMULARY MEDICATION Diltiazem 2%/lidocaine  5% apply pea sized amount three times daily for 2 months 30 g 0   aspirin  EC 81 MG tablet Take 81 mg by mouth at bedtime.     atorvastatin  (LIPITOR ) 80 MG tablet TAKE 1 TABLET BY MOUTH EVERYDAY AT BEDTIME 90 tablet 3   betamethasone  dipropionate 0.05 % cream Apply 1 Application topically 2 (two) times daily as needed (swelling/ itching).     cetirizine  (ZYRTEC ) 10 MG tablet Take 10 mg by mouth daily.     Cyanocobalamin  (B-12 COMPLIANCE INJECTION) 1000 MCG/ML KIT Inject 1,000 mcg as directed every 30 (thirty) days.     EPINEPHrine  0.3 mg/0.3 mL IJ SOAJ injection Inject 0.3 mg into the muscle as needed for anaphylaxis. 2 each 0   esomeprazole  (NEXIUM ) 40 MG capsule Take 1 capsule (40 mg total) by mouth 2 (two) times daily before a meal. 60 capsule 2   fluticasone  (FLONASE ) 50 MCG/ACT nasal spray Place 2 sprays into both nostrils daily. 48 g 1   hydrochlorothiazide  (HYDRODIURIL ) 25 MG tablet Take 1 tablet (25 mg total) by mouth daily. 30 tablet 2   HYDROcodone -acetaminophen  (NORCO/VICODIN) 5-325 MG tablet Take 1 tablet by mouth every 6 (six) hours as needed for moderate pain (pain score 4-6).     levothyroxine  (SYNTHROID ) 100 MCG tablet Take 1 tablet (100 mcg total) by mouth daily. 90 tablet 3   nitroGLYCERIN  (NITROSTAT ) 0.4 MG SL tablet Place 1 tablet (0.4 mg total) under the tongue every 5 (five) minutes as needed for chest pain. 25 tablet 3    olmesartan  (BENICAR ) 40 MG tablet Take 1 tablet (40 mg total) by mouth daily. 90 tablet 2   ondansetron  (ZOFRAN -ODT) 4 MG disintegrating tablet TAKE 1 TABLET BY MOUTH EVERY 8 HOURS AS NEEDED FOR NAUSEA AND VOMITING 20 tablet 0   PARoxetine  (PAXIL ) 20 MG tablet Take 1 tablet (20 mg total) by mouth daily. 90 tablet 3   promethazine  (PHENERGAN ) 25 MG tablet TAKE 0.5-1 TABLETS (12.5-25 MG TOTAL) BY MOUTH 2 (TWO) TIMES DAILY AS NEEDED FOR NAUSEA OR VOMITING. 45 tablet 1   [Paused] meloxicam  (MOBIC ) 7.5 MG tablet Take 7.5 mg by mouth at bedtime. (Patient not taking: Reported on 02/26/2024)     Current Facility-Administered Medications on File Prior to Visit  Medication Dose Route Frequency Provider Last Rate Last Admin   cyanocobalamin  (VITAMIN B12) injection 1,000 mcg  1,000 mcg Intramuscular Q30 days Jolinda Potter M, DO   1,000 mcg at 02/03/24 0911    LABS/IMAGING: No results found for this or any previous visit (from the past 48 hours). No results found.  LIPID PANEL:    Component Value Date/Time   CHOL 142 12/23/2023 2325   CHOL 145 08/30/2023 0941   CHOL 136 10/29/2012 0922   TRIG 133 12/23/2023 2325   TRIG 139 04/28/2013 1505   TRIG 103 10/29/2012 0922   HDL 46 12/23/2023 2325   HDL 45 08/30/2023 0941   HDL 44 04/28/2013 1505   HDL 41 10/29/2012 0922   CHOLHDL 3.1 12/23/2023 2325   VLDL 27 12/23/2023 2325   LDLCALC 69 12/23/2023 2325   LDLCALC 73 08/30/2023 0941   LDLCALC 50 04/28/2013 1505   LDLCALC 74 10/29/2012 0922     WEIGHTS: Wt Readings from Last 3 Encounters:  02/26/24 210 lb (95.3 kg)  02/05/24 213 lb (96.6 kg)  01/21/24 216  lb (98 kg)    VITALS: BP 110/62   Pulse (!) 50   Ht 5' 10 (1.778 m)   Wt 210 lb (95.3 kg)   SpO2 95%   BMI 30.13 kg/m   EXAM: General appearance: alert and no distress Lungs: clear to auscultation bilaterally Heart: Regular bradycardia Extremities: extremities normal, atraumatic, no cyanosis or edema Neurologic: Grossly  normal  EKG: EKG Interpretation Date/Time:  Wednesday February 26 2024 10:40:10 EDT Ventricular Rate:  50 PR Interval:  184 QRS Duration:  94 QT Interval:  452 QTC Calculation: 412 R Axis:   12  Text Interpretation: Sinus bradycardia When compared with ECG of 23-Dec-2023 23:15, No significant change was found Confirmed by Mona Kent [phone]) on 02/26/2024 10:50:45 AM - personally reviewed  ASSESSMENT: Unstable angina Abnormal Myoview  suggestive of reversible inferior ischemia CAD status post PCI to the LAD in 2017 with residual 30% proximal to mid RCA stenosis Hypertension Dyslipidemia, goal LDL less than 70  PLAN: 1.   Mr. Schultes has been describing unstable angina with symptoms of chest pain, worsening fatigue, diaphoresis and nausea and was noted to have an abnormal Myoview  with possible reversible inferior ischemia.  There was some bowel attenuation artifact however constellation of symptoms including the bradycardia with heart rates as low as in the 30s is certainly concerning for RCA ischemia.  He is on no AV nodal blocking agents.  Since he remains symptomatic, I would suggest a definitive cardiac catheterization.  I did discuss the risk, benefits and alternatives graded catheterization today and he is agreeable to proceed.  I we will get updated lab work and try to arrange that for tomorrow or Friday.  Informed Consent   Shared Decision Making/Informed Consent The risks [stroke (1 in 1000), death (1 in 1000), kidney failure [usually temporary] (1 in 500), bleeding (1 in 200), allergic reaction [possibly serious] (1 in 200)], benefits (diagnostic support and management of coronary artery disease) and alternatives of a cardiac catheterization were discussed in detail with Mr. Trull and he is willing to proceed.    Plan follow-up with Dr. Lavona after cath.  Kent KYM Mona, MD, [facility], FNLA, FACP  [place]  [facility]  Medical Director of the Advanced Lipid Disorders &   Cardiovascular Risk Reduction Clinic Diplomate of the American Board of Clinical Lipidology Attending Cardiologist  Direct Dial: [phone]  Fax: [phone]  Website:  [email or web address] Kent BROCKS Countess Biebel 02/26/2024, 11:02 AM

## 2024-02-27 ENCOUNTER — Encounter (HOSPITAL_COMMUNITY): Payer: Self-pay | Admitting: Cardiology

## 2024-02-27 NOTE — Interval H&P Note (Signed)
 History and Physical Interval Note:  02/27/2024 7:26 AM  Jeffrey Hooper  has presented today for surgery, with the diagnosis of chesk pain.  The various methods of treatment have been discussed with the patient and family. After consideration of risks, benefits and other options for treatment, the patient has consented to  Procedure(s): LEFT HEART CATH AND CORONARY ANGIOGRAPHY (N/A) as a surgical intervention.  The patient's history has been reviewed, patient examined, no change in status, stable for surgery.  I have reviewed the patient's chart and labs.  Questions were answered to the patient's satisfaction.     [facility]

## 2024-02-29 ENCOUNTER — Encounter (HOSPITAL_BASED_OUTPATIENT_CLINIC_OR_DEPARTMENT_OTHER): Payer: Self-pay | Admitting: Emergency Medicine

## 2024-02-29 ENCOUNTER — Other Ambulatory Visit: Payer: Self-pay

## 2024-02-29 ENCOUNTER — Emergency Department (HOSPITAL_BASED_OUTPATIENT_CLINIC_OR_DEPARTMENT_OTHER): Admission: EM | Admit: 2024-02-29 | Discharge: 2024-02-29 | Disposition: A | Discharge status: Home / Self Care

## 2024-02-29 ENCOUNTER — Emergency Department (HOSPITAL_BASED_OUTPATIENT_CLINIC_OR_DEPARTMENT_OTHER)

## 2024-02-29 DIAGNOSIS — E785 Hyperlipidemia, unspecified: Secondary | ICD-10-CM | POA: Diagnosis not present

## 2024-02-29 DIAGNOSIS — I2510 Atherosclerotic heart disease of native coronary artery without angina pectoris: Secondary | ICD-10-CM | POA: Diagnosis not present

## 2024-02-29 DIAGNOSIS — Z7982 Long term (current) use of aspirin: Secondary | ICD-10-CM | POA: Insufficient documentation

## 2024-02-29 DIAGNOSIS — R0602 Shortness of breath: Secondary | ICD-10-CM | POA: Diagnosis not present

## 2024-02-29 DIAGNOSIS — J329 Chronic sinusitis, unspecified: Secondary | ICD-10-CM | POA: Insufficient documentation

## 2024-02-29 DIAGNOSIS — I10 Essential (primary) hypertension: Secondary | ICD-10-CM | POA: Diagnosis not present

## 2024-02-29 DIAGNOSIS — R059 Cough, unspecified: Secondary | ICD-10-CM | POA: Insufficient documentation

## 2024-02-29 DIAGNOSIS — Z79899 Other long term (current) drug therapy: Secondary | ICD-10-CM | POA: Insufficient documentation

## 2024-02-29 LAB — RESP PANEL BY RT-PCR (RSV, FLU A&B, COVID)  RVPGX2
Influenza A by PCR: NEGATIVE
Influenza B by PCR: NEGATIVE
Resp Syncytial Virus by PCR: NEGATIVE
SARS Coronavirus 2 by RT PCR: NEGATIVE

## 2024-02-29 LAB — CBC
HCT: 34.4 % — ABNORMAL LOW (ref 39.0–52.0)
Hemoglobin: 11.8 g/dL — ABNORMAL LOW (ref 13.0–17.0)
MCH: 31.1 pg (ref 26.0–34.0)
MCHC: 34.3 g/dL (ref 30.0–36.0)
MCV: 90.8 fL (ref 80.0–100.0)
Platelets: 200 K/uL (ref 150–400)
RBC: 3.79 MIL/uL — ABNORMAL LOW (ref 4.22–5.81)
RDW: 13.5 % (ref 11.5–15.5)
WBC: 8.4 K/uL (ref 4.0–10.5)
nRBC: 0 % (ref 0.0–0.2)

## 2024-02-29 LAB — BASIC METABOLIC PANEL WITH GFR
Anion gap: 10 (ref 5–15)
BUN: 17 mg/dL (ref 8–23)
CO2: 26 mmol/L (ref 22–32)
Calcium: 9.1 mg/dL (ref 8.9–10.3)
Chloride: 106 mmol/L (ref 98–111)
Creatinine, Ser: 1.07 mg/dL (ref 0.61–1.24)
GFR, Estimated: >60 mL/min (ref >60)
Glucose, Bld: 92 mg/dL (ref 70–99)
Potassium: 3.6 mmol/L (ref 3.5–5.1)
Sodium: 142 mmol/L (ref 135–145)

## 2024-02-29 LAB — TROPONIN T, HIGH SENSITIVITY: Troponin T High Sensitivity: 16 ng/L (ref <19)

## 2024-02-29 MED ORDER — BENZONATATE 100 MG PO CAPS
200.0000 mg | ORAL_CAPSULE | Freq: Once | ORAL | Status: AC
  Administered 2024-02-29: 200 mg via ORAL
  Filled 2024-02-29: qty 2

## 2024-02-29 MED ORDER — AMOXICILLIN-POT CLAVULANATE 875-125 MG PO TABS
1.0000 | ORAL_TABLET | Freq: Once | ORAL | Status: AC
  Administered 2024-02-29: 1 via ORAL
  Filled 2024-02-29: qty 1

## 2024-02-29 MED ORDER — BENZONATATE 100 MG PO CAPS: 100.0000 mg | ORAL_CAPSULE | Freq: Three times a day (TID) | ORAL | 0 refills | Status: DC

## 2024-02-29 MED ORDER — HYDROCODONE-ACETAMINOPHEN 5-325 MG PO TABS: 1.0000 | ORAL_TABLET | Freq: Four times a day (QID) | ORAL | 0 refills | Status: DC | PRN

## 2024-02-29 MED ORDER — AMOXICILLIN-POT CLAVULANATE 875-125 MG PO TABS: 1.0000 | ORAL_TABLET | Freq: Two times a day (BID) | ORAL | 0 refills | Status: DC

## 2024-02-29 NOTE — Discharge Instructions (Signed)
 Your x-ray and blood work were reassuring.  Please take the hydrocodone  with as needed for pain.  Take the antibiotic as prescribed.  Please follow-up with your doctor and return to the ER for worsening symptoms.

## 2024-02-29 NOTE — ED Triage Notes (Signed)
 Pt arrived with c/o cough, congestion, occasional fevers, sinus pressure for about a week and a half.   Recently at cath lab for previous MI and being treated for blockages.

## 2024-02-29 NOTE — ED Provider Notes (Signed)
 [place] EMERGENCY DEPARTMENT AT [facility] Provider Note   CSN: [id number] Arrival date & time: 02/29/24  9173     Patient presents with: Cough   Jeffrey Hooper is a 63 y.o. male.   63 year old male with past medical history of coronary artery disease, hypertension, and hyperlipidemia presenting to the emergency department today with cough, nasal congestion, subjective fevers, and shortness of breath.  The patient dates he is also having some ongoing chest pain.  He states that he recently had a cardiac catheterization performed earlier this week.  There were some blockages noted but these were calcified and he is being treated medically at this time.  He states that he is having ongoing symptoms of this but has been having nasal congestion now over the past 1.5 weeks.  He is coughing up some yellow sputum.  Denies a history of asthma or COPD.  He came to the ER today for further evaluation regarding this due to ongoing symptoms.  He states that the chest discomfort is relatively stable.  Occurs intermittently with exertion but usually improves with rest.   Cough Associated symptoms: shortness of breath        Prior to Admission medications   Medication Sig Start Date End Date Taking? Authorizing Provider  amoxicillin -clavulanate (AUGMENTIN ) 875-125 MG tablet Take 1 tablet by mouth every 12 (twelve) hours. 02/29/24  Yes Ula Prentice SAUNDERS, MD  benzonatate  (TESSALON ) 100 MG capsule Take 1 capsule (100 mg total) by mouth every 8 (eight) hours. 02/29/24  Yes Ula Prentice SAUNDERS, MD  HYDROcodone -acetaminophen  (NORCO/VICODIN) 5-325 MG tablet Take 1 tablet by mouth every 6 (six) hours as needed for severe pain (pain score 7-10) or moderate pain (pain score 4-6). 02/29/24  Yes Ula Prentice SAUNDERS, MD  albuterol  (VENTOLIN  HFA) 108 (90 Base) MCG/ACT inhaler TAKE 2 PUFFS BY MOUTH EVERY 6 HOURS AS NEEDED FOR WHEEZE OR SHORTNESS OF BREATH 01/30/23   Jolinda Potter M, DO  AMBULATORY NON FORMULARY  MEDICATION Diltiazem 2%/lidocaine  5% apply pea sized amount three times daily for 2 months 01/21/24   Arletta, Camie FORBES RIGGERS  aspirin  EC 81 MG tablet Take 81 mg by mouth at bedtime.    [provider]  atorvastatin  (LIPITOR ) 80 MG tablet TAKE 1 TABLET BY MOUTH EVERYDAY AT BEDTIME 08/30/23   Jolinda Potter M, DO  betamethasone  dipropionate 0.05 % cream Apply 1 Application topically 2 (two) times daily as needed (swelling/ itching).    [provider]  cetirizine  (ZYRTEC ) 10 MG tablet Take 10 mg by mouth daily.    [provider]  Cyanocobalamin  (B-12 COMPLIANCE INJECTION) 1000 MCG/ML KIT Inject 1,000 mcg as directed every 30 (thirty) days.    [provider]  EPINEPHrine  0.3 mg/0.3 mL IJ SOAJ injection Inject 0.3 mg into the muscle as needed for anaphylaxis. 02/05/24   Lavona Agent, MD  esomeprazole  (NEXIUM ) 40 MG capsule Take 1 capsule (40 mg total) by mouth 2 (two) times daily before a meal. 01/21/24   Heinz, Sara E, PA-C  fluticasone  (FLONASE ) 50 MCG/ACT nasal spray Place 2 sprays into both nostrils daily. 06/14/20   Jolinda Potter [name], DO  isosorbide  mononitrate (IMDUR ) 30 MG 24 hr tablet Take 1 tablet (30 mg total) by mouth daily. 02/26/24 02/25/25  Swaziland, Peter M, MD  levothyroxine  (SYNTHROID ) 100 MCG tablet Take 1 tablet (100 mcg total) by mouth daily. 08/30/23   Jolinda Potter [name], DO  metoprolol  succinate (TOPROL  XL) 25 MG 24 hr tablet Take 1 tablet (25 mg  total) by mouth daily. 02/26/24 02/25/25  [place]il[place], Peter M, MD  nitroGLYCERIN  (NITROSTAT ) 0.4 MG SL tablet Place 1 tablet (0.4 mg total) under the tongue every 5 (five) minutes as needed for chest pain. 02/05/24   Lavona Agent, MD  olmesartan  (BENICAR ) 40 MG tablet Take 1 tablet (40 mg total) by mouth daily. 12/09/23   Rana Lum CROME, NP  ondansetron  (ZOFRAN -ODT) 4 MG disintegrating tablet TAKE 1 TABLET BY MOUTH EVERY 8 HOURS AS NEEDED FOR NAUSEA AND VOMITING 08/30/23   Jolinda Potter M, DO  PARoxetine   (PAXIL ) 20 MG tablet Take 1 tablet (20 mg total) by mouth daily. 08/30/23   Jolinda Potter [name], DO  promethazine  (PHENERGAN ) 25 MG tablet TAKE 0.5-1 TABLETS (12.5-25 MG TOTAL) BY MOUTH 2 (TWO) TIMES DAILY AS NEEDED FOR NAUSEA OR VOMITING. 11/15/21   Jolinda Potter M, DO    Allergies: Alpha-gal, Beef (bovine) protein, Beef-derived drug products, Lambs quarters, Pork-derived products, Prednisone , Brilinta  [ticagrelor ], Doxycycline, Gabapentin , Lyrica [pregabalin], Methylprednisolone  sodium succ, Penicillins, Shellfish allergy , Adhesive [tape], and Fentanyl     Review of Systems  Respiratory:  Positive for cough and shortness of breath.   All other systems reviewed and are negative.   Updated Vital Signs BP 123/65   Pulse (!) 45   Temp 98.1 F (36.7 C) (Oral)   Resp 18   Ht 5' 10 (1.778 m)   Wt 95.3 kg   SpO2 98%   BMI 30.13 kg/m   Physical Exam Vitals and nursing note reviewed.   Gen: NAD Eyes: PERRL, EOMI HEENT: no oropharyngeal swelling, the patient has frontal maxillary sinus tenderness noted Neck: trachea midline Resp: clear to auscultation bilaterally although diminished at bilateral lung bases Card: RRR, no murmurs, rubs, or gallops Abd: nontender, nondistended Extremities: no calf tenderness, no edema Vascular: 2+ radial pulses bilaterally, 2+ DP pulses bilaterally Skin: no rashes Psyc: acting appropriately   (all labs ordered are listed, but only abnormal results are displayed) Labs Reviewed  CBC - Abnormal; Notable for the following components:      Result Value   RBC 3.79 (*)    Hemoglobin 11.8 (*)    HCT 34.4 (*)    All other components within normal limits  RESP PANEL BY RT-PCR (RSV, FLU A&B, COVID)  RVPGX2  BASIC METABOLIC PANEL WITH GFR  TROPONIN T, HIGH SENSITIVITY    EKG: EKG Interpretation Date/Time:  Saturday February 29 2024 09:09:44 EDT Ventricular Rate:  42 PR Interval:    QRS Duration:  96 QT Interval:  442 QTC Calculation: 370 R  Axis:   36  Text Interpretation: Sinus bradycardia Low voltage, precordial leads Confirmed by Ula Barter [phone]) on 02/29/2024 9:22:35 AM  Radiology: [facility] Chest Port 1 View Result Date: 02/29/2024 CLINICAL DATA:  Shortness of breath EXAM: PORTABLE CHEST 1 VIEW COMPARISON:  Chest radiograph dated 12/23/2023. FINDINGS: No focal consolidation, pleural effusion, or pneumothorax. The cardiac silhouette is within normal limits. Coronary vascular calcification or stent. No acute osseous pathology. IMPRESSION: No active disease. Electronically Signed   By: Vanetta Chou M.D.   On: 02/29/2024 09:46     Procedures   Medications Ordered in the ED  amoxicillin -clavulanate (AUGMENTIN ) 875-125 MG per tablet 1 tablet (has no administration in time range)  benzonatate  (TESSALON ) capsule 200 mg (200 mg Oral Given 02/29/24 0919)  Medical Decision Making 63 year old male with past medical history of coronary artery disease, hypertension, and hyperlipidemia presenting to the emergency department today with cough, nasal congestion, and shortness of breath as well as some ongoing chest pain has been going now for the past few weeks in the setting of known coronary artery disease.  I will further evaluate the patient here with basic labs as well as an EKG and troponin to evaluate for ACS or worsening ischemia although this sounds to be more infectious.  Will give the patient Tessalon  for cough.  Will obtain x-ray to evaluate for pulmonary infiltrates, pulmonary edema, or pneumothorax.  Will also obtain an RSV/COVID/flu swab to evaluate for viral etiology.  This is turned out to be viral with 1.5 weeks of symptoms we will consider treating for sinusitis.  I will reevaluate for ultimate disposition.  His workup is favorable I think that he can be discharged on oral antibiotics.  The patient's labs are reassuring.  Troponin is negative.  EKG does not show any acute ischemic changes.   Chest x-ray is unremarkable.  Given the patient's known coronary artery disease we will avoid NSAIDs and give the patient a short course of pain medication for his sinus pain and chest discomfort with coughing.  He is discharged with return precautions.  Amount and/or Complexity of Data Reviewed Labs: ordered. Radiology: ordered.  Risk Prescription drug management.        Final diagnoses:  Sinusitis, unspecified chronicity, unspecified location    ED Discharge Orders          Ordered    amoxicillin -clavulanate (AUGMENTIN ) 875-125 MG tablet  Every 12 hours        02/29/24 1016    benzonatate  (TESSALON ) 100 MG capsule  Every 8 hours        02/29/24 1016    HYDROcodone -acetaminophen  (NORCO/VICODIN) 5-325 MG tablet  Every 6 hours PRN        02/29/24 1016               Ula Prentice SAUNDERS, MD 02/29/24 1017

## 2024-03-04 ENCOUNTER — Telehealth: Payer: Self-pay | Admitting: Cardiology

## 2024-03-04 NOTE — Telephone Encounter (Signed)
 Called patient and patient calling about Abnormal EKG. Patients reports mild chest pain at times, but denies lightheadedness, dizziness or headaches at this time. ED precautions reviewed with patient and patient verbalized if symptoms resume and worsen he will go to Emergency Room. Patient verbalized an understanding. Appointment made for patient  to see Dr. Lavona 03/13/24 @8 :00. Made patient aware to call office for any questions or concerns. Understanding verbalized. SABRA

## 2024-03-04 NOTE — Telephone Encounter (Signed)
 Patient is calling in about his results. Please advise

## 2024-03-05 ENCOUNTER — Ambulatory Visit (INDEPENDENT_AMBULATORY_CARE_PROVIDER_SITE_OTHER)

## 2024-03-05 DIAGNOSIS — E538 Deficiency of other specified B group vitamins: Secondary | ICD-10-CM

## 2024-03-05 MED ORDER — CYANOCOBALAMIN 1000 MCG/ML IJ SOLN
1000.0000 ug | INTRAMUSCULAR | Status: AC
  Administered 2024-03-05 – 2024-08-21 (×6): 1000 ug via INTRAMUSCULAR

## 2024-03-05 NOTE — Progress Notes (Signed)
 Patient is in office today for a nurse visit for B12 Injection. Patient Injection was given in the  Left deltoid. Patient tolerated injection well.

## 2024-03-09 ENCOUNTER — Ambulatory Visit (INDEPENDENT_AMBULATORY_CARE_PROVIDER_SITE_OTHER)

## 2024-03-09 ENCOUNTER — Encounter: Payer: Self-pay | Admitting: Family Medicine

## 2024-03-09 ENCOUNTER — Ambulatory Visit (INDEPENDENT_AMBULATORY_CARE_PROVIDER_SITE_OTHER): Admitting: Family Medicine

## 2024-03-09 VITALS — BP 160/68 | HR 40 | Temp 97.8°F | Ht 70.5 in | Wt 224.4 lb

## 2024-03-09 DIAGNOSIS — R0602 Shortness of breath: Secondary | ICD-10-CM | POA: Diagnosis not present

## 2024-03-09 DIAGNOSIS — I25119 Atherosclerotic heart disease of native coronary artery with unspecified angina pectoris: Secondary | ICD-10-CM | POA: Diagnosis not present

## 2024-03-09 DIAGNOSIS — R001 Bradycardia, unspecified: Secondary | ICD-10-CM

## 2024-03-09 DIAGNOSIS — R079 Chest pain, unspecified: Secondary | ICD-10-CM

## 2024-03-09 DIAGNOSIS — M7989 Other specified soft tissue disorders: Secondary | ICD-10-CM

## 2024-03-09 MED ORDER — FUROSEMIDE 40 MG PO TABS: 40.0000 mg | ORAL_TABLET | Freq: Two times a day (BID) | ORAL | 0 refills | Status: DC

## 2024-03-09 MED ORDER — POTASSIUM CHLORIDE CRYS ER 20 MEQ PO TBCR: 20.0000 meq | EXTENDED_RELEASE_TABLET | Freq: Every day | ORAL | 0 refills | Status: DC

## 2024-03-09 NOTE — Progress Notes (Signed)
 Subjective: CC: CAD with symptomatic bradycardia PCP: [name], DO YEP:Jeffrey Hooper is a 63 y.o. male presenting to clinic today for:  1. CAD with symptomatic bradycardia Had coronary catheterization recently.  Has follow up visit with Dr Lavona this week.  Since catheterization he notes increasing lower extremity swelling, shortness of breath and chest pain.  He has been trying to hold out and has an appointment with his cardiologist.  He is currently treated with metoprolol  extended release 25 mg daily and Imdur  30 mg daily.  His HCTZ was discontinued.   ROS: Per HPI  Allergies  Allergen Reactions   Alpha-Gal Rash and Other (See Comments)    SEVERE ALLERGY  TO ANY MEAT OR MEAT DERIVED PRODUCTS FROM 4 LEGGED ANIMALS > > BEEF, PORK , GOATS, DEER, ETC. < < RESULT OF BITE FROM LONE STAR TICK   Beef (Bovine) Protein Rash and Other (See Comments)   Beef-Derived Drug Products Rash and Other (See Comments)   Lambs Quarters Rash and Other (See Comments)   Pork-Derived Products Rash and Other (See Comments)   Prednisone  Other (See Comments)    im depo medrol  caused dizziness - pt passed out. Ask pt before giving. Pt can take oral prednisone     Brilinta  [Ticagrelor ] Hives    Pt ate pork on the same day he took Brilinta  before knowing he had alpha gal. May have been the alpha gal reaction, pt is unsure.    Doxycycline Hives   Gabapentin  Swelling   Lyrica [Pregabalin]     Dry mouth and felt raw   Methylprednisolone  Sodium Succ     im depo medrol  caused dizziness - pt passed out. Ask pt before giving. Issue occurred when administered in the left arm    Penicillins Hives   Shellfish Allergy  Hives   Adhesive [Tape] Rash   Fentanyl  Rash    Reaction to adhesive, not the drug    Past Medical History:  Diagnosis Date   Allergy     Allergy  to alpha-gal    Anxiety    Arthritis    CAD (coronary artery disease), native coronary artery 12/03/2015   Coronary artery disease     Dysrhythmia    irregular and PAC'S   GERD (gastroesophageal reflux disease)    Head injury with loss of consciousness (HCC)    back in the 1980's   History of kidney stones    Hyperlipidemia    Hypertension    Hypothyroid    Multiple benign nevi    Neuromuscular disorder (HCC) 2011   LEFT RADIAL NERVE SURGERY R/T TRAUMA   Pneumonia 2014, 2017   Prostatism    Pulmonary nodule    Spinal cord stimulator status    told to bring remote to surgery    Current Outpatient Medications:    albuterol  (VENTOLIN  HFA) 108 (90 Base) MCG/ACT inhaler, TAKE 2 PUFFS BY MOUTH EVERY 6 HOURS AS NEEDED FOR WHEEZE OR SHORTNESS OF BREATH, Disp: 18 each, Rfl: 1   AMBULATORY NON FORMULARY MEDICATION, Diltiazem 2%/lidocaine  5% apply pea sized amount three times daily for 2 months, Disp: 30 g, Rfl: 0   aspirin  EC 81 MG tablet, Take 81 mg by mouth at bedtime., Disp: , Rfl:    atorvastatin  (LIPITOR ) 80 MG tablet, TAKE 1 TABLET BY MOUTH EVERYDAY AT BEDTIME, Disp: 90 tablet, Rfl: 3   benzonatate  (TESSALON ) 100 MG capsule, Take 1 capsule (100 mg total) by mouth every 8 (eight) hours., Disp: 21 capsule, Rfl: 0   betamethasone  dipropionate  0.05 % cream, Apply 1 Application topically 2 (two) times daily as needed (swelling/ itching)., Disp: , Rfl:    cetirizine  (ZYRTEC ) 10 MG tablet, Take 10 mg by mouth daily., Disp: , Rfl:    Cyanocobalamin  (B-12 COMPLIANCE INJECTION) 1000 MCG/ML KIT, Inject 1,000 mcg as directed every 30 (thirty) days., Disp: , Rfl:    EPINEPHrine  0.3 mg/0.3 mL IJ SOAJ injection, Inject 0.3 mg into the muscle as needed for anaphylaxis., Disp: 2 each, Rfl: 0   esomeprazole  (NEXIUM ) 40 MG capsule, Take 1 capsule (40 mg total) by mouth 2 (two) times daily before a meal., Disp: 60 capsule, Rfl: 2   fluticasone  (FLONASE ) 50 MCG/ACT nasal spray, Place 2 sprays into both nostrils daily., Disp: 48 g, Rfl: 1   HYDROcodone -acetaminophen  (NORCO/VICODIN) 5-325 MG tablet, Take 1 tablet by mouth every 6 (six) hours  as needed for severe pain (pain score 7-10) or moderate pain (pain score 4-6)., Disp: 10 tablet, Rfl: 0   isosorbide  mononitrate (IMDUR ) 30 MG 24 hr tablet, Take 1 tablet (30 mg total) by mouth daily., Disp: 30 tablet, Rfl: 11   levothyroxine  (SYNTHROID ) 100 MCG tablet, Take 1 tablet (100 mcg total) by mouth daily., Disp: 90 tablet, Rfl: 3   metoprolol  succinate (TOPROL  XL) 25 MG 24 hr tablet, Take 1 tablet (25 mg total) by mouth daily., Disp: 30 tablet, Rfl: 11   nitroGLYCERIN  (NITROSTAT ) 0.4 MG SL tablet, Place 1 tablet (0.4 mg total) under the tongue every 5 (five) minutes as needed for chest pain., Disp: 25 tablet, Rfl: 3   olmesartan  (BENICAR ) 40 MG tablet, Take 1 tablet (40 mg total) by mouth daily., Disp: 90 tablet, Rfl: 2   ondansetron  (ZOFRAN -ODT) 4 MG disintegrating tablet, TAKE 1 TABLET BY MOUTH EVERY 8 HOURS AS NEEDED FOR NAUSEA AND VOMITING, Disp: 20 tablet, Rfl: 0   PARoxetine  (PAXIL ) 20 MG tablet, Take 1 tablet (20 mg total) by mouth daily., Disp: 90 tablet, Rfl: 3   promethazine  (PHENERGAN ) 25 MG tablet, TAKE 0.5-1 TABLETS (12.5-25 MG TOTAL) BY MOUTH 2 (TWO) TIMES DAILY AS NEEDED FOR NAUSEA OR VOMITING., Disp: 45 tablet, Rfl: 1   amoxicillin -clavulanate (AUGMENTIN ) 875-125 MG tablet, Take 1 tablet by mouth every 12 (twelve) hours., Disp: 14 tablet, Rfl: 0  Current Facility-Administered Medications:    cyanocobalamin  (VITAMIN B12) injection 1,000 mcg, 1,000 mcg, Intramuscular, Q30 days, Michaelah Credeur M, DO, 1,000 mcg at 03/05/24 9153 Social History   Socioeconomic History   Marital status: Married    Spouse name: Not on file   Number of children: 3   Years of education: Not on file   Highest education level: GED or equivalent  Occupational History   Occupation: [employer]  Tobacco Use   Smoking status: Former    Current packs/day: 0.00    Average packs/day: 1 pack/day for 20.0 years (20.0 ttl pk-yrs)    Types: Cigarettes    Start date: 11/28/1986     Quit date: 11/28/2006    Years since quitting: 17.2   Smokeless tobacco: Never  Vaping Use   Vaping status: Never Used  Substance and Sexual Activity   Alcohol use: Not Currently    Comment: rarely   Drug use: No   Sexual activity: Not Currently  Other Topics Concern   Not on file  Social History Narrative   Lives at home with wife, not working disabled from the city of [place]   They have an adopted son at home (age 16  2022)  Former smoker no alcohol tobacco or drug use now   Social Drivers of Corporate investment banker Strain: Low Risk  (03/06/2024)   Overall Financial Resource Strain (CARDIA)    Difficulty of Paying Living Expenses: Not hard at all  Food Insecurity: No Food Insecurity (03/06/2024)   Hunger Vital Sign    Worried About Running Out of Food in the Last Year: Never true    Ran Out of Food in the Last Year: Never true  Transportation Needs: No Transportation Needs (03/06/2024)   PRAPARE - Administrator, Civil Service (Medical): No    Lack of Transportation (Non-Medical): No  Physical Activity: Unknown (03/06/2024)   Exercise Vital Sign    Days of Exercise per Week: Patient declined    Minutes of Exercise per Session: Not on file  Stress: No Stress Concern Present (03/06/2024)   [employer] of Occupational Health - Occupational Stress Questionnaire    Feeling of Stress: Not at all  Social Connections: Socially Integrated (03/06/2024)   Social Connection and Isolation Panel    Frequency of Communication with Friends and Family: More than three times a week    Frequency of Social Gatherings with Friends and Family: Three times a week    Attends Religious Services: More than 4 times per year    Active Member of Clubs or Organizations: Yes    Attends Banker Meetings: More than 4 times per year    Marital Status: Married  Catering manager Violence: Not At Risk (12/23/2023)   Humiliation, Afraid, Rape, and Kick questionnaire    Fear of  Current or Ex-Partner: No    Emotionally Abused: No    Physically Abused: No    Sexually Abused: No   Family History  Problem Relation Age of Onset   Heart failure Mother 31       Died age 51   Congestive Heart Failure Mother    Heart attack Father 78       Died with MI   Heart attack Paternal Grandmother    Heart attack Paternal Grandfather    Colon cancer Neg Hx    Esophageal cancer Neg Hx    Pancreatic cancer Neg Hx    Stomach cancer Neg Hx     Objective: Office vital signs reviewed. BP (!) 160/68   Pulse (!) 40   Temp 97.8 F (36.6 C)   Ht 5' 10.5 (1.791 m)   Wt 224 lb 6 oz (101.8 kg)   SpO2 97%   BMI 31.74 kg/m   Physical Examination:  General: Awake, alert, nontoxic male, No acute distress HEENT: Sclera white.  Moist mucous membranes Cardio: Cardiac rate with regular rhythm, S1S2 heard, no murmurs appreciated Pulm: Subtle Rales appreciated at the bases of the lungs bilaterally.  Otherwise normal work of breathing on room air Extremities: warm, well perfused, 1+ pitting edema to knees bilaterally  Assessment/ Plan: 63 y.o. male   Atherosclerosis of native coronary artery of native heart with angina pectoris (HCC) - Plan: EKG 12-Lead, DG Chest 2 View  Symptomatic bradycardia - Plan: EKG 12-Lead, DG Chest 2 View  Chest pain, unspecified type - Plan: EKG 12-Lead, Brain natriuretic peptide, DG Chest 2 View, furosemide  (LASIX ) 40 MG tablet, potassium chloride  SA (KLOR-CON  M) 20 MEQ tablet, CANCELED: Brain natriuretic peptide  Shortness of breath - Plan: Brain natriuretic peptide, DG Chest 2 View, furosemide  (LASIX ) 40 MG tablet, potassium chloride  SA (KLOR-CON  M) 20 MEQ tablet, CANCELED: Brain natriuretic peptide  Leg swelling - Plan: Brain natriuretic peptide, Basic Metabolic Panel, CBC, DG Chest 2 View, furosemide  (LASIX ) 40 MG tablet, potassium chloride  SA (KLOR-CON  M) 20 MEQ tablet, CANCELED: Brain natriuretic peptide  I am concerned that he may be  demonstrating some signs and symptoms of fluid overload due to cardiac etiology.  He has known CAD with recent catheterization.  We discussed that beta-blockers in fact can increase lower extremity swelling and he had pitting edema to knees bilaterally today.  I appreciated some rales on his exam and so chest x-ray was obtained along with stat BNP, BMP and CBC.  I contacted his cardiologist, Dr. Lavona, for any further instructions.  He agreed with Lasix .  He has appointment on Friday but will hopefully see him sooner if able.  He advised him to go to the ER if he becomes unstable.  I have also added potassium since we are putting him on an aggressive regimen of Lasix  for the next few days.  EKG demonstrated sinus bradycardia but I do not appreciate any acute ischemic changes.  Personal review of chest x-ray seems to have some slight infiltrate suggestive of pulmonary edema but awaiting formal review by radiology.  I discussed with the patient the risk of coronary event if continues beta-blocker abruptly   Norene CHRISTELLA Fielding, DO [facility] [phone]

## 2024-03-10 ENCOUNTER — Ambulatory Visit: Payer: Self-pay | Admitting: Family Medicine

## 2024-03-10 ENCOUNTER — Other Ambulatory Visit: Payer: Self-pay

## 2024-03-10 ENCOUNTER — Other Ambulatory Visit

## 2024-03-10 DIAGNOSIS — R079 Chest pain, unspecified: Secondary | ICD-10-CM

## 2024-03-10 DIAGNOSIS — M7989 Other specified soft tissue disorders: Secondary | ICD-10-CM

## 2024-03-10 DIAGNOSIS — R0602 Shortness of breath: Secondary | ICD-10-CM

## 2024-03-10 DIAGNOSIS — D649 Anemia, unspecified: Secondary | ICD-10-CM

## 2024-03-10 LAB — BASIC METABOLIC PANEL WITH GFR
BUN/Creatinine Ratio: 11 (ref 10–24)
BUN: 13 mg/dL (ref 8–27)
CO2: 27 mmol/L (ref 20–29)
Calcium: 8.9 mg/dL (ref 8.6–10.2)
Chloride: 104 mmol/L (ref 96–106)
Creatinine, Ser: 1.21 mg/dL (ref 0.76–1.27)
Glucose: 103 mg/dL — ABNORMAL HIGH (ref 70–99)
Potassium: 5.3 mmol/L — ABNORMAL HIGH (ref 3.5–5.2)
Sodium: 141 mmol/L (ref 134–144)
eGFR: 68 mL/min/1.73 (ref >59)

## 2024-03-10 LAB — BRAIN NATRIURETIC PEPTIDE

## 2024-03-10 LAB — CBC
Hematocrit: 35.5 % — ABNORMAL LOW (ref 37.5–51.0)
Hemoglobin: 11.9 g/dL — ABNORMAL LOW (ref 13.0–17.7)
MCH: 31.6 pg (ref 26.6–33.0)
MCHC: 33.5 g/dL (ref 31.5–35.7)
MCV: 94 fL (ref 79–97)
Platelets: 260 x10E3/uL (ref 150–450)
RBC: 3.76 x10E6/uL — ABNORMAL LOW (ref 4.14–5.80)
RDW: 13.9 % (ref 11.6–15.4)
WBC: 7.1 x10E3/uL (ref 3.4–10.8)

## 2024-03-11 ENCOUNTER — Ambulatory Visit: Payer: Self-pay | Admitting: Family Medicine

## 2024-03-11 LAB — ANEMIA PROFILE B
Basophils Absolute: 0.1 x10E3/uL (ref 0.0–0.2)
Basos: 1 %
EOS (ABSOLUTE): 0.4 x10E3/uL (ref 0.0–0.4)
Eos: 6 %
Ferritin: 53 ng/mL (ref 30–400)
Folate: 17.7 ng/mL (ref >3.0)
Hematocrit: 38.9 % (ref 37.5–51.0)
Hemoglobin: 12.6 g/dL — ABNORMAL LOW (ref 13.0–17.7)
Immature Grans (Abs): 0 x10E3/uL (ref 0.0–0.1)
Immature Granulocytes: 0 %
Iron Saturation: 21 % (ref 15–55)
Iron: 64 ug/dL (ref 38–169)
Lymphocytes Absolute: 1.5 x10E3/uL (ref 0.7–3.1)
Lymphs: 22 %
MCH: 31.7 pg (ref 26.6–33.0)
MCHC: 32.4 g/dL (ref 31.5–35.7)
MCV: 98 fL — ABNORMAL HIGH (ref 79–97)
Monocytes Absolute: 0.6 x10E3/uL (ref 0.1–0.9)
Monocytes: 9 %
Neutrophils Absolute: 4.2 x10E3/uL (ref 1.4–7.0)
Neutrophils: 62 %
Platelets: 272 x10E3/uL (ref 150–450)
RBC: 3.98 x10E6/uL — ABNORMAL LOW (ref 4.14–5.80)
RDW: 13.9 % (ref 11.6–15.4)
Retic Ct Pct: 2.3 % (ref 0.6–2.6)
Total Iron Binding Capacity: 303 ug/dL (ref 250–450)
UIBC: 239 ug/dL (ref 111–343)
Vitamin B-12: 1033 pg/mL (ref 232–1245)
WBC: 6.8 x10E3/uL (ref 3.4–10.8)

## 2024-03-11 LAB — FECAL OCCULT BLOOD, IMMUNOCHEMICAL: Fecal Occult Bld: NEGATIVE

## 2024-03-11 LAB — BRAIN NATRIURETIC PEPTIDE: BNP: 150.7 pg/mL — ABNORMAL HIGH (ref 0.0–100.0)

## 2024-03-12 NOTE — Progress Notes (Unsigned)
 Cardiology Office Note:   Date:  03/13/2024  ID:  Jeffrey Hooper, DOB Dec 26, 1960, MRN [id number] PCP: [name], DO  [facility] Providers Cardiologist:  Lynwood Schilling, MD {  History of Present Illness:   Jeffrey Hooper is a 63 y.o. male with CAD and previous stenting to his LAD.  He had a stress test with inferior infarct with ischemia.    Cardiac cath in July 2025 demonstrated an occluded RCA with patent stent in the LAD and non obstructive circ disease.  He saw [name], DO this week and he had SOB and chest pain.  He has also had bradycardia.  He has had continued chest discomfort really since May when he was in the hospital.  I did look back and troponins were negative both times.  He says the discomfort is an aching and a sharp discomfort under his chest.  Constant.  It can be 6 out of 7 in intensity.  It might wax and wane a little bit.  He has had increasing shortness of breath.  He has had increased lower extremity swelling.  When he saw his primary the other day he was given Lasix  40 mg twice a day and some potassium.  However, with his potassium came back slightly elevated.  When he was discharged from the hospital he was on a low-dose of beta-blocker although he has significant bradycardia.  He was started on low-dose Imdur .  He really has not had any improvement since this.  He is getting dyspneic walking up a slight incline in his yard.  He is having sometimes when he has to take a deep breath.  He has chronically slept in a recliner because of his back.  This is not any different.  ROS: As stated in the HPI and negative for all other systems.   Studies Reviewed:    EKG:   EKG Interpretation Date/Time:  Friday March 13 2024 07:56:31 EDT Ventricular Rate:  38 PR Interval:  176 QRS Duration:  94 QT Interval:  504 QTC Calculation: 400 R Axis:   0  Text Interpretation: Marked sinus bradycardia When compared with ECG of 29-Feb-2024 09:09, No  significant change since last tracing Confirmed by Schilling Lynwood ([id number]) on 03/13/2024 8:02:55 AM    Risk Assessment/Calculations:              Physical Exam:   VS:  BP (!) 106/50 (BP Location: Left Arm, Patient Position: Sitting, Cuff Size: Normal)   Pulse (!) 45   Ht 5' 10.5 (1.791 m)   Wt 216 lb 8 oz (98.2 kg)   SpO2 97%   BMI 30.63 kg/m    Wt Readings from Last 3 Encounters:  03/13/24 216 lb 8 oz (98.2 kg)  03/09/24 224 lb 6 oz (101.8 kg)  02/29/24 210 lb (95.3 kg)     GEN: Well nourished, well developed in no acute distress NECK: No JVD; No carotid bruits CARDIAC: RRR, no murmurs, rubs, gallops RESPIRATORY:  Clear to auscultation without rales, wheezing or rhonchi  ABDOMEN: Soft, non-tender, non-distended EXTREMITIES:  No edema; No deformity   ASSESSMENT AND PLAN:   CAD:   I am not entirely sure that his pain is anginal.  I am going to increase his Imdur .  I am going to stop the beta-blocker because of his significant bradycardia.  I will review his films with Dr. Swaziland and I did look at them myself to see if we should attempt chronic total occlusion angioplasty.  He does have a large RCA fed by collaterals  Essential hypertension:   His blood pressure is controlled.  No change in therapy.   Dyslipidemia: LDL was 69 with an HDL of 46.  No change in therapy.   Bradycardia: As above I will discontinue his beta-blocker.   SVT: He had no symptomatic tachypalpitations.  No change in therapy other than discontinuation of the beta-blocker.  SOB: He did get a little improvement in his breathing and significant improvement in some lower extremity swelling with Lasix .  His BNP was very mildly elevated.  Chest x-ray was clear.  I will continue Lasix  20 mg daily and check a basic metabolic profile today.    Follow up with me in 2 months.  Signed, Lynwood Schilling, MD

## 2024-03-12 NOTE — H&P (View-Only) (Signed)
 Cardiology Office Note:   Date:  03/13/2024  ID:  Jeffrey Hooper, DOB Dec 26, 1960, MRN [id number] PCP: [name], DO  [facility] Providers Cardiologist:  Lynwood Schilling, MD {  History of Present Illness:   Jeffrey Hooper is a 63 y.o. male with CAD and previous stenting to his LAD.  He had a stress test with inferior infarct with ischemia.    Cardiac cath in July 2025 demonstrated an occluded RCA with patent stent in the LAD and non obstructive circ disease.  He saw [name], DO this week and he had SOB and chest pain.  He has also had bradycardia.  He has had continued chest discomfort really since May when he was in the hospital.  I did look back and troponins were negative both times.  He says the discomfort is an aching and a sharp discomfort under his chest.  Constant.  It can be 6 out of 7 in intensity.  It might wax and wane a little bit.  He has had increasing shortness of breath.  He has had increased lower extremity swelling.  When he saw his primary the other day he was given Lasix  40 mg twice a day and some potassium.  However, with his potassium came back slightly elevated.  When he was discharged from the hospital he was on a low-dose of beta-blocker although he has significant bradycardia.  He was started on low-dose Imdur .  He really has not had any improvement since this.  He is getting dyspneic walking up a slight incline in his yard.  He is having sometimes when he has to take a deep breath.  He has chronically slept in a recliner because of his back.  This is not any different.  ROS: As stated in the HPI and negative for all other systems.   Studies Reviewed:    EKG:   EKG Interpretation Date/Time:  Friday March 13 2024 07:56:31 EDT Ventricular Rate:  38 PR Interval:  176 QRS Duration:  94 QT Interval:  504 QTC Calculation: 400 R Axis:   0  Text Interpretation: Marked sinus bradycardia When compared with ECG of 29-Feb-2024 09:09, No  significant change since last tracing Confirmed by Schilling Lynwood ([id number]) on 03/13/2024 8:02:55 AM    Risk Assessment/Calculations:              Physical Exam:   VS:  BP (!) 106/50 (BP Location: Left Arm, Patient Position: Sitting, Cuff Size: Normal)   Pulse (!) 45   Ht 5' 10.5 (1.791 m)   Wt 216 lb 8 oz (98.2 kg)   SpO2 97%   BMI 30.63 kg/m    Wt Readings from Last 3 Encounters:  03/13/24 216 lb 8 oz (98.2 kg)  03/09/24 224 lb 6 oz (101.8 kg)  02/29/24 210 lb (95.3 kg)     GEN: Well nourished, well developed in no acute distress NECK: No JVD; No carotid bruits CARDIAC: RRR, no murmurs, rubs, gallops RESPIRATORY:  Clear to auscultation without rales, wheezing or rhonchi  ABDOMEN: Soft, non-tender, non-distended EXTREMITIES:  No edema; No deformity   ASSESSMENT AND PLAN:   CAD:   I am not entirely sure that his pain is anginal.  I am going to increase his Imdur .  I am going to stop the beta-blocker because of his significant bradycardia.  I will review his films with Dr. Swaziland and I did look at them myself to see if we should attempt chronic total occlusion angioplasty.  He does have a large RCA fed by collaterals  Essential hypertension:   His blood pressure is controlled.  No change in therapy.   Dyslipidemia: LDL was 69 with an HDL of 46.  No change in therapy.   Bradycardia: As above I will discontinue his beta-blocker.   SVT: He had no symptomatic tachypalpitations.  No change in therapy other than discontinuation of the beta-blocker.  SOB: He did get a little improvement in his breathing and significant improvement in some lower extremity swelling with Lasix .  His BNP was very mildly elevated.  Chest x-ray was clear.  I will continue Lasix  20 mg daily and check a basic metabolic profile today.    Follow up with me in 2 months.  Signed, Lynwood Schilling, MD

## 2024-03-13 ENCOUNTER — Telehealth: Payer: Self-pay

## 2024-03-13 ENCOUNTER — Encounter: Payer: Self-pay | Admitting: Cardiology

## 2024-03-13 ENCOUNTER — Telehealth: Payer: Self-pay | Admitting: Cardiology

## 2024-03-13 ENCOUNTER — Ambulatory Visit: Attending: Cardiology | Admitting: Cardiology

## 2024-03-13 ENCOUNTER — Other Ambulatory Visit: Payer: Self-pay

## 2024-03-13 VITALS — BP 106/50 | HR 45 | Ht 70.5 in | Wt 216.5 lb

## 2024-03-13 DIAGNOSIS — I2510 Atherosclerotic heart disease of native coronary artery without angina pectoris: Secondary | ICD-10-CM

## 2024-03-13 DIAGNOSIS — Z9861 Coronary angioplasty status: Secondary | ICD-10-CM | POA: Diagnosis present

## 2024-03-13 DIAGNOSIS — I10 Essential (primary) hypertension: Secondary | ICD-10-CM | POA: Diagnosis present

## 2024-03-13 DIAGNOSIS — R079 Chest pain, unspecified: Secondary | ICD-10-CM | POA: Insufficient documentation

## 2024-03-13 DIAGNOSIS — Z01812 Encounter for preprocedural laboratory examination: Secondary | ICD-10-CM

## 2024-03-13 DIAGNOSIS — E785 Hyperlipidemia, unspecified: Secondary | ICD-10-CM | POA: Insufficient documentation

## 2024-03-13 LAB — BASIC METABOLIC PANEL WITH GFR
BUN/Creatinine Ratio: 12 (ref 10–24)
BUN: 15 mg/dL (ref 8–27)
CO2: 24 mmol/L (ref 20–29)
Calcium: 8.9 mg/dL (ref 8.6–10.2)
Chloride: 103 mmol/L (ref 96–106)
Creatinine, Ser: 1.28 mg/dL — ABNORMAL HIGH (ref 0.76–1.27)
Glucose: 90 mg/dL (ref 70–99)
Potassium: 3.7 mmol/L (ref 3.5–5.2)
Sodium: 144 mmol/L (ref 134–144)
eGFR: 63 mL/min/1.73 (ref >59)

## 2024-03-13 MED ORDER — FUROSEMIDE 20 MG PO TABS: 20.0000 mg | ORAL_TABLET | Freq: Every day | ORAL | 3 refills | Status: DC

## 2024-03-13 MED ORDER — CLOPIDOGREL BISULFATE 75 MG PO TABS: 75.0000 mg | ORAL_TABLET | Freq: Every day | ORAL | 3 refills | Status: DC

## 2024-03-13 MED ORDER — ISOSORBIDE MONONITRATE ER 120 MG PO TB24: 120.0000 mg | ORAL_TABLET | Freq: Every day | ORAL | 3 refills | Status: DC

## 2024-03-13 NOTE — Telephone Encounter (Signed)
 Pt calling to schedule heart cath. Pt wanted to reschedule on 08/18.

## 2024-03-13 NOTE — Telephone Encounter (Signed)
  [facility] A DEPT OF [place] AT [address] A DEPT OF THE [place] [address]  [place] [address] Dept: [phone] Loc: [phone]  YISRAEL OBRYAN  03/13/2024  You are scheduled for a Cardiac Catheterization on Thursday, August 14 with Dr. Peter Swaziland.  1. Please arrive at the [facility] (Main Entrance A) at [facility]: [address] [place], [place] [address] at 5:30 AM (This time is 2 hour(s) before your procedure to ensure your preparation).   Free valet parking service is available. You will check in at ADMITTING. The support person will be asked to wait in the waiting room.  It is OK to have someone drop you off and come back when you are ready to be discharged.    Special note: Every effort is made to have your procedure done on time. Please understand that emergencies sometimes delay scheduled procedures.  2. Diet: Nothing to eat after midnight.You may have clear liquids   3. Hydration: You need to be well hydrated before your procedure time. You may drink approved liquids (see below) until you arrive at the hospital. On the way to the hospital, please drink a 16-oz (1 plastic bottle) of water.   List of approved liquids water, clear juice, clear tea, black coffee, fruit juices, non-citric and without pulp, carbonated beverages, Gatorade, Kool -Aid, plain Jello-O and plain ice popsicles.   4. Labs: You will need to have blood drawn on Monday 8/11 at Dr.Jordan's office [facility].  You do not need to be fasting.  5. Medication instructions in preparation for your procedure:      Hold Furosemide  morning of cath       On the morning of your procedure, take your Aspirin  81 mg and Plavix /Clopidogrel  and any morning medicines NOT listed above.  You may use sips of water.  6. Plan to go home the same day, you will only stay overnight if medically necessary. 7. Bring a current list of your  medications and current insurance cards. 8. You MUST have a responsible person to drive you home. 9. Someone MUST be with you the first 24 hours after you arrive home or your discharge will be delayed. 10. Please wear clothes that are easy to get on and off and wear slip-on shoes.  Thank you for allowing us  to care for you!   -- [facility]

## 2024-03-13 NOTE — Patient Instructions (Addendum)
 Medication Instructions:  Stop Metoprolol  Stop Potassium Increase Imdur  to 120 mg once daily Decrease Lasix  to 20 mg once daily *If you need a refill on your cardiac medications before your next appointment, please call your pharmacy*  Lab Work: BMET today at [facility] If you have labs (blood work) drawn today and your tests are completely normal, you will receive your results only by: [employer] (if you have MyChart) OR A paper copy in the mail If you have any lab test that is abnormal or we need to change your treatment, we will call you to review the results.  Testing/Procedures: NONE  Follow-Up: At [facility], you and your health needs are our priority.  As part of our continuing mission to provide you with exceptional heart care, our providers are all part of one team.  This team includes your primary Cardiologist (physician) and Advanced Practice Providers or APPs (Physician Assistants and Nurse Practitioners) who all work together to provide you with the care you need, when you need it.  Your next appointment:   2 month(s) in [place]  Provider:   Lynwood Schilling, MD    We recommend signing up for the patient portal called MyChart.  Sign up information is provided on this After Visit Summary.  MyChart is used to connect with patients for Virtual Visits (Telemedicine).  Patients are able to view lab/test results, encounter notes, upcoming appointments, etc.  Non-urgent messages can be sent to your provider as well.   To learn more about what you can do with MyChart, go to [email or web address].

## 2024-03-13 NOTE — Telephone Encounter (Signed)
 Patient saw Dr. Lavona today.  From today's office visit note: CAD:   I am not entirely sure that his pain is anginal.  I am going to increase his Imdur .  I am going to stop the beta-blocker because of his significant bradycardia.  I will review his films with Dr. Swaziland and I did look at them myself to see if we should attempt chronic total occlusion angioplasty.  He does have a large RCA fed by collaterals   Will forward to Dr. Swaziland and his nurse to help arrange cath.

## 2024-03-13 NOTE — Telephone Encounter (Signed)
 I spoke to patient CTO scheduled 8/14 at [facility] hospital.Arrive at 5:30 am.Patient will come to office Mon 8/11 to pick up cath instructions and have pre cath labs.

## 2024-03-15 ENCOUNTER — Ambulatory Visit: Payer: Self-pay | Admitting: Cardiology

## 2024-03-16 ENCOUNTER — Other Ambulatory Visit: Payer: Self-pay | Admitting: Cardiology

## 2024-03-16 DIAGNOSIS — I25118 Atherosclerotic heart disease of native coronary artery with other forms of angina pectoris: Secondary | ICD-10-CM

## 2024-03-17 ENCOUNTER — Telehealth: Payer: Self-pay | Admitting: *Deleted

## 2024-03-17 ENCOUNTER — Ambulatory Visit: Payer: Self-pay | Admitting: Cardiology

## 2024-03-17 LAB — CBC WITH DIFFERENTIAL/PLATELET
Basophils Absolute: 0.1 x10E3/uL (ref 0.0–0.2)
Basos: 1 %
EOS (ABSOLUTE): 0.3 x10E3/uL (ref 0.0–0.4)
Eos: 5 %
Hematocrit: 37.5 % (ref 37.5–51.0)
Hemoglobin: 12.5 g/dL — ABNORMAL LOW (ref 13.0–17.7)
Immature Grans (Abs): 0 x10E3/uL (ref 0.0–0.1)
Immature Granulocytes: 0 %
Lymphocytes Absolute: 1.4 x10E3/uL (ref 0.7–3.1)
Lymphs: 22 %
MCH: 31.2 pg (ref 26.6–33.0)
MCHC: 33.3 g/dL (ref 31.5–35.7)
MCV: 94 fL (ref 79–97)
Monocytes Absolute: 0.5 x10E3/uL (ref 0.1–0.9)
Monocytes: 8 %
Neutrophils Absolute: 4 x10E3/uL (ref 1.4–7.0)
Neutrophils: 64 %
Platelets: 248 x10E3/uL (ref 150–450)
RBC: 4.01 x10E6/uL — ABNORMAL LOW (ref 4.14–5.80)
RDW: 13.6 % (ref 11.6–15.4)
WBC: 6.3 x10E3/uL (ref 3.4–10.8)

## 2024-03-17 LAB — BASIC METABOLIC PANEL WITH GFR
BUN/Creatinine Ratio: 9 — ABNORMAL LOW (ref 10–24)
BUN: 11 mg/dL (ref 8–27)
CO2: 21 mmol/L (ref 20–29)
Calcium: 9 mg/dL (ref 8.6–10.2)
Chloride: 104 mmol/L (ref 96–106)
Creatinine, Ser: 1.27 mg/dL (ref 0.76–1.27)
Glucose: 135 mg/dL — ABNORMAL HIGH (ref 70–99)
Potassium: 3.5 mmol/L (ref 3.5–5.2)
Sodium: 144 mmol/L (ref 134–144)
eGFR: 64 mL/min/1.73 (ref >59)

## 2024-03-17 NOTE — Telephone Encounter (Signed)
 CTO  scheduled at [facility] for: Thursday March 19, 2024 7:30 AM Arrival time [facility] Main Entrance A at: 5:30 AM  Diet: -Nothing to eat after midnight prior to procedure.  Hydration: -May drink clear liquids until leaving for hospital. Approved liquids: Water , clear tea, black coffee, fruit juices-non-citric and without pulp,Gatorade, plain Jello/popsicles.  Drink 16 oz. bottle of water  on the way to the hospital.   Medication instructions: -Hold:  Lasix -AM of procedure  -Other usual morning medications can be taken including aspirin  81 mg and Plavix  75 mg.  Plan to go home the same day, you will only stay overnight if medically necessary.  You must have responsible adult to drive you home.  Someone must be with you the first 24 hours after you arrive home.  Reviewed procedure instructions with patient.

## 2024-03-19 ENCOUNTER — Ambulatory Visit: Admitting: Internal Medicine

## 2024-03-19 ENCOUNTER — Encounter (HOSPITAL_COMMUNITY): Payer: Self-pay | Admitting: Cardiology

## 2024-03-19 ENCOUNTER — Other Ambulatory Visit: Payer: Self-pay

## 2024-03-19 ENCOUNTER — Ambulatory Visit (HOSPITAL_COMMUNITY)
Admission: RE | Admit: 2024-03-19 | Discharge: 2024-03-20 | Disposition: A | Discharge status: Home / Self Care | Attending: Cardiology | Admitting: Cardiology

## 2024-03-19 ENCOUNTER — Ambulatory Visit (HOSPITAL_COMMUNITY)
Admission: RE | Disposition: A | Discharge status: Home / Self Care | Payer: Self-pay | Source: Home / Self Care | Attending: Cardiology

## 2024-03-19 DIAGNOSIS — I209 Angina pectoris, unspecified: Secondary | ICD-10-CM | POA: Diagnosis present

## 2024-03-19 DIAGNOSIS — R001 Bradycardia, unspecified: Secondary | ICD-10-CM | POA: Diagnosis not present

## 2024-03-19 DIAGNOSIS — Z955 Presence of coronary angioplasty implant and graft: Secondary | ICD-10-CM | POA: Diagnosis not present

## 2024-03-19 DIAGNOSIS — E785 Hyperlipidemia, unspecified: Secondary | ICD-10-CM | POA: Diagnosis present

## 2024-03-19 DIAGNOSIS — R0602 Shortness of breath: Secondary | ICD-10-CM | POA: Insufficient documentation

## 2024-03-19 DIAGNOSIS — I2584 Coronary atherosclerosis due to calcified coronary lesion: Secondary | ICD-10-CM | POA: Insufficient documentation

## 2024-03-19 DIAGNOSIS — I25118 Atherosclerotic heart disease of native coronary artery with other forms of angina pectoris: Secondary | ICD-10-CM | POA: Insufficient documentation

## 2024-03-19 DIAGNOSIS — I10 Essential (primary) hypertension: Secondary | ICD-10-CM | POA: Diagnosis not present

## 2024-03-19 DIAGNOSIS — I2582 Chronic total occlusion of coronary artery: Secondary | ICD-10-CM | POA: Insufficient documentation

## 2024-03-19 DIAGNOSIS — I471 Supraventricular tachycardia, unspecified: Secondary | ICD-10-CM | POA: Insufficient documentation

## 2024-03-19 DIAGNOSIS — Z79899 Other long term (current) drug therapy: Secondary | ICD-10-CM | POA: Insufficient documentation

## 2024-03-19 DIAGNOSIS — I2510 Atherosclerotic heart disease of native coronary artery without angina pectoris: Secondary | ICD-10-CM

## 2024-03-19 DIAGNOSIS — E039 Hypothyroidism, unspecified: Secondary | ICD-10-CM | POA: Diagnosis present

## 2024-03-19 HISTORY — PX: CORONARY CTO INTERVENTION: CATH118236

## 2024-03-19 LAB — POCT ACTIVATED CLOTTING TIME
Activated Clotting Time: 452 s
Activated Clotting Time: 515 s
Activated Clotting Time: 279 s
Activated Clotting Time: 176 s

## 2024-03-19 MED ORDER — PANTOPRAZOLE SODIUM 40 MG PO TBEC
40.0000 mg | DELAYED_RELEASE_TABLET | Freq: Every day | ORAL | Status: DC
  Administered 2024-03-20: 40 mg via ORAL
  Filled 2024-03-19: qty 1

## 2024-03-19 MED ORDER — FENTANYL CITRATE (PF) 100 MCG/2ML IJ SOLN
INTRAMUSCULAR | Status: DC | PRN
  Administered 2024-03-19 (×3): 25 ug via INTRAVENOUS

## 2024-03-19 MED ORDER — SODIUM CHLORIDE 0.9 % IV SOLN: 250.0000 mL | INTRAVENOUS | Status: DC | PRN

## 2024-03-19 MED ORDER — ACETAMINOPHEN 325 MG PO TABS
650.0000 mg | ORAL_TABLET | ORAL | Status: DC | PRN
  Administered 2024-03-19 – 2024-03-20 (×2): 650 mg via ORAL
  Filled 2024-03-19: qty 2

## 2024-03-19 MED ORDER — SODIUM CHLORIDE 0.9% FLUSH
3.0000 mL | Freq: Two times a day (BID) | INTRAVENOUS | Status: DC
  Administered 2024-03-19 – 2024-03-20 (×3): 3 mL via INTRAVENOUS

## 2024-03-19 MED ORDER — LABETALOL HCL 5 MG/ML IV SOLN: 10.0000 mg | INTRAVENOUS | Status: AC | PRN

## 2024-03-19 MED ORDER — ASPIRIN 81 MG PO CHEW: 81.0000 mg | CHEWABLE_TABLET | ORAL | Status: DC

## 2024-03-19 MED ORDER — SODIUM CHLORIDE 0.9 % IV SOLN
INTRAVENOUS | Status: DC | PRN
  Administered 2024-03-19 (×2): 1.75 mg/kg/h via INTRAVENOUS

## 2024-03-19 MED ORDER — PAROXETINE HCL 20 MG PO TABS
20.0000 mg | ORAL_TABLET | Freq: Every day | ORAL | Status: DC
  Administered 2024-03-19 – 2024-03-20 (×2): 20 mg via ORAL
  Filled 2024-03-19 (×2): qty 1

## 2024-03-19 MED ORDER — SODIUM CHLORIDE 0.9% FLUSH: 3.0000 mL | INTRAVENOUS | Status: DC | PRN

## 2024-03-19 MED ORDER — MIDAZOLAM HCL 2 MG/2ML IJ SOLN
INTRAMUSCULAR | Status: AC
  Filled 2024-03-19: qty 2

## 2024-03-19 MED ORDER — ATORVASTATIN CALCIUM 80 MG PO TABS
80.0000 mg | ORAL_TABLET | Freq: Every day | ORAL | Status: DC
  Administered 2024-03-19 – 2024-03-20 (×2): 80 mg via ORAL
  Filled 2024-03-19 (×2): qty 1

## 2024-03-19 MED ORDER — FREE WATER: 500.0000 mL | Freq: Once | Status: DC

## 2024-03-19 MED ORDER — HYDROCODONE-ACETAMINOPHEN 5-325 MG PO TABS
1.0000 | ORAL_TABLET | Freq: Four times a day (QID) | ORAL | Status: DC | PRN
  Administered 2024-03-19 – 2024-03-20 (×4): 1 via ORAL
  Filled 2024-03-19 (×3): qty 1

## 2024-03-19 MED ORDER — IRBESARTAN 150 MG PO TABS
300.0000 mg | ORAL_TABLET | Freq: Every day | ORAL | Status: DC
  Administered 2024-03-20: 300 mg via ORAL
  Filled 2024-03-19: qty 2

## 2024-03-19 MED ORDER — HYDROCODONE-ACETAMINOPHEN 5-325 MG PO TABS
ORAL_TABLET | ORAL | Status: AC
  Filled 2024-03-19: qty 1

## 2024-03-19 MED ORDER — NITROGLYCERIN 1 MG/10 ML FOR IR/CATH LAB
INTRA_ARTERIAL | Status: AC
  Filled 2024-03-19: qty 10

## 2024-03-19 MED ORDER — RANOLAZINE ER 500 MG PO TB12
500.0000 mg | ORAL_TABLET | Freq: Two times a day (BID) | ORAL | Status: DC
  Administered 2024-03-19 – 2024-03-20 (×3): 500 mg via ORAL
  Filled 2024-03-19 (×3): qty 1

## 2024-03-19 MED ORDER — LORATADINE 10 MG PO TABS
10.0000 mg | ORAL_TABLET | Freq: Every day | ORAL | Status: DC
  Administered 2024-03-20: 10 mg via ORAL
  Filled 2024-03-19: qty 1

## 2024-03-19 MED ORDER — ASPIRIN 81 MG PO TBEC: 81.0000 mg | DELAYED_RELEASE_TABLET | Freq: Every day | ORAL | Status: DC

## 2024-03-19 MED ORDER — HYDRALAZINE HCL 20 MG/ML IJ SOLN: 10.0000 mg | INTRAMUSCULAR | Status: AC | PRN

## 2024-03-19 MED ORDER — ACETAMINOPHEN 325 MG PO TABS
ORAL_TABLET | ORAL | Status: AC
Start: 2024-03-19 — End: 2024-03-20
  Filled 2024-03-19: qty 2

## 2024-03-19 MED ORDER — ONDANSETRON HCL 4 MG/2ML IJ SOLN: 4.0000 mg | Freq: Four times a day (QID) | INTRAMUSCULAR | Status: DC | PRN

## 2024-03-19 MED ORDER — LIDOCAINE IN D5W 4-5 MG/ML-% IV SOLN
INTRAVENOUS | Status: AC
  Filled 2024-03-19: qty 500

## 2024-03-19 MED ORDER — LEVOTHYROXINE SODIUM 100 MCG PO TABS
100.0000 ug | ORAL_TABLET | Freq: Every day | ORAL | Status: DC
  Administered 2024-03-19 – 2024-03-20 (×2): 100 ug via ORAL
  Filled 2024-03-19 (×2): qty 1

## 2024-03-19 MED ORDER — BIVALIRUDIN BOLUS VIA INFUSION - CUPID
INTRAVENOUS | Status: DC | PRN
  Administered 2024-03-19: 28.86 mg via INTRAVENOUS

## 2024-03-19 MED ORDER — FENTANYL CITRATE (PF) 100 MCG/2ML IJ SOLN
INTRAMUSCULAR | Status: AC
Start: 2024-03-19 — End: 2024-03-19
  Filled 2024-03-19: qty 2

## 2024-03-19 MED ORDER — ISOSORBIDE MONONITRATE ER 60 MG PO TB24
120.0000 mg | ORAL_TABLET | Freq: Every day | ORAL | Status: DC
  Administered 2024-03-20: 120 mg via ORAL
  Filled 2024-03-19: qty 2

## 2024-03-19 MED ORDER — IOHEXOL 350 MG/ML SOLN
INTRAVENOUS | Status: DC | PRN
  Administered 2024-03-19: 150 mL

## 2024-03-19 MED ORDER — NITROGLYCERIN 0.4 MG SL SUBL: 0.4000 mg | SUBLINGUAL_TABLET | SUBLINGUAL | Status: DC | PRN

## 2024-03-19 MED ORDER — MIDAZOLAM HCL 2 MG/2ML IJ SOLN
INTRAMUSCULAR | Status: DC | PRN
  Administered 2024-03-19 (×2): 1 mg via INTRAVENOUS
  Administered 2024-03-19: 2 mg via INTRAVENOUS

## 2024-03-19 MED ORDER — SODIUM CHLORIDE 0.9% FLUSH: 3.0000 mL | Freq: Two times a day (BID) | INTRAVENOUS | Status: DC

## 2024-03-19 MED ORDER — LIDOCAINE HCL (PF) 1 % IJ SOLN
INTRAMUSCULAR | Status: DC | PRN
  Administered 2024-03-19 (×2): 5 mL

## 2024-03-19 NOTE — Interval H&P Note (Signed)
 History and Physical Interval Note:  03/19/2024 7:12 AM  Jeffrey Hooper  has presented today for surgery, with the diagnosis of cto.  The various methods of treatment have been discussed with the patient and family. After consideration of risks, benefits and other options for treatment, the patient has consented to  Procedure(s): CORONARY CTO INTERVENTION (N/A) as a surgical intervention.  The patient's history has been reviewed, patient examined, no change in status, stable for surgery.  I have reviewed the patient's chart and labs.  Questions were answered to the patient's satisfaction.   Cath Lab Visit (complete for each Cath Lab visit)  Clinical Evaluation Leading to the Procedure:   ACS: No.  Non-ACS:    Anginal Classification: CCS III  Anti-ischemic medical therapy: Maximal Therapy (2 or more classes of medications)  Non-Invasive Test Results: Intermediate-risk stress test findings: cardiac mortality 1-3%/year  Prior CABG: No previous CABG        Maude [facility] 03/19/2024 7:12 AM

## 2024-03-19 NOTE — Progress Notes (Incomplete)
 Sheath Removal  1342 Right femoral 6 Fr sheath removed. VSS throughout. Manual pressure held 35 minutes Right fem level 0

## 2024-03-19 NOTE — Progress Notes (Signed)
 Site area: Left femoral  Site Prior to Removal:  Level 0, clean, dry and intact Pressure Applied For: 30 minutes Manual:   Yes Patient Status During Pull:  stable  Post Pull Site:  Level 0, clean, dry and intact Post Pull Instructions Given:   yes Post Pull Pulses Present: Bilateral dp's palpable Dressing Applied:  gauze and tegaderm Bedrest begins @  1523 Comments:

## 2024-03-20 ENCOUNTER — Other Ambulatory Visit (HOSPITAL_COMMUNITY): Payer: Self-pay

## 2024-03-20 ENCOUNTER — Other Ambulatory Visit: Payer: Self-pay | Admitting: Physician Assistant

## 2024-03-20 DIAGNOSIS — I2584 Coronary atherosclerosis due to calcified coronary lesion: Secondary | ICD-10-CM | POA: Diagnosis not present

## 2024-03-20 DIAGNOSIS — I209 Angina pectoris, unspecified: Secondary | ICD-10-CM | POA: Diagnosis not present

## 2024-03-20 DIAGNOSIS — I25118 Atherosclerotic heart disease of native coronary artery with other forms of angina pectoris: Secondary | ICD-10-CM | POA: Diagnosis not present

## 2024-03-20 DIAGNOSIS — Z955 Presence of coronary angioplasty implant and graft: Secondary | ICD-10-CM | POA: Diagnosis not present

## 2024-03-20 DIAGNOSIS — I2582 Chronic total occlusion of coronary artery: Secondary | ICD-10-CM | POA: Diagnosis not present

## 2024-03-20 LAB — BASIC METABOLIC PANEL WITH GFR
Anion gap: 10 (ref 5–15)
BUN: 12 mg/dL (ref 8–23)
CO2: 25 mmol/L (ref 22–32)
Calcium: 9 mg/dL (ref 8.9–10.3)
Chloride: 107 mmol/L (ref 98–111)
Creatinine, Ser: 1.11 mg/dL (ref 0.61–1.24)
GFR, Estimated: >60 mL/min (ref >60)
Glucose, Bld: 98 mg/dL (ref 70–99)
Potassium: 3.4 mmol/L — ABNORMAL LOW (ref 3.5–5.1)
Sodium: 142 mmol/L (ref 135–145)

## 2024-03-20 LAB — CBC
HCT: 36.9 % — ABNORMAL LOW (ref 39.0–52.0)
Hemoglobin: 12.9 g/dL — ABNORMAL LOW (ref 13.0–17.0)
MCH: 31.5 pg (ref 26.0–34.0)
MCHC: 35 g/dL (ref 30.0–36.0)
MCV: 90 fL (ref 80.0–100.0)
Platelets: 190 K/uL (ref 150–400)
RBC: 4.1 MIL/uL — ABNORMAL LOW (ref 4.22–5.81)
RDW: 13.8 % (ref 11.5–15.5)
WBC: 7.1 K/uL (ref 4.0–10.5)
nRBC: 0 % (ref 0.0–0.2)

## 2024-03-20 MED ORDER — RANOLAZINE ER 500 MG PO TB12: 500.0000 mg | ORAL_TABLET | Freq: Two times a day (BID) | ORAL | 11 refills | Status: DC

## 2024-03-20 MED FILL — Nitroglycerin IV Soln 100 MCG/ML in D5W: INTRA_ARTERIAL | Qty: 10 | Status: AC

## 2024-03-20 NOTE — Plan of Care (Signed)
  Problem: Cardiovascular: Goal: Ability to achieve and maintain adequate cardiovascular perfusion will improve Outcome: Progressing Goal: Vascular access site(s) Level 0-1 will be maintained Outcome: Completed/Met   Problem: Clinical Measurements: Goal: Ability to maintain clinical measurements within normal limits will improve Outcome: Progressing Goal: Will remain free from infection Outcome: Progressing Goal: Diagnostic test results will improve Outcome: Progressing Goal: Respiratory complications will improve Outcome: Progressing Goal: Cardiovascular complication will be avoided Outcome: Progressing

## 2024-03-20 NOTE — Progress Notes (Addendum)
 Rounding Note   Patient Name: Jeffrey Hooper Date of Encounter: 03/20/2024  [facility] Cardiologist: Lynwood Schilling, MD   Subjective Ambulated this morning without much issue. Slight SOB with exertion.   Scheduled Meds:  aspirin  EC  81 mg Oral QHS   atorvastatin   80 mg Oral Daily   free water   500 mL Oral Once   irbesartan   300 mg Oral Daily   isosorbide  mononitrate  120 mg Oral Daily   levothyroxine   100 mcg Oral Daily   loratadine   10 mg Oral Daily   pantoprazole   40 mg Oral Daily   PARoxetine   20 mg Oral Daily   ranolazine   500 mg Oral BID   sodium chloride  flush  3 mL Intravenous Q12H   Continuous Infusions:  sodium chloride      PRN Meds: sodium chloride , acetaminophen , HYDROcodone -acetaminophen , nitroGLYCERIN , ondansetron  (ZOFRAN ) IV, sodium chloride  flush   Vital Signs  Vitals:   03/19/24 1949 03/19/24 2338 03/20/24 0446 03/20/24 0856  BP: 137/61 128/73 (!) 147/72 138/66  Pulse: (!) 46 (!) 52  (!) 49  Resp: 16 16 16 17   Temp: 98.2 F (36.8 C) 98.7 F (37.1 C) 98.4 F (36.9 C) 97.7 F (36.5 C)  TempSrc: Oral Oral Axillary Oral  SpO2: 97% 96%  97%  Weight:      Height:        Intake/Output Summary (Last 24 hours) at 03/20/2024 1130 Last data filed at 03/20/2024 0503 Gross per 24 hour  Intake 826.34 ml  Output 1600 ml  Net -773.66 ml      03/19/2024    5:56 AM 03/13/2024    7:46 AM 03/09/2024    1:18 PM  Last 3 Weights  Weight (lbs) 212 lb 216 lb 8 oz 224 lb 6 oz  Weight (kg) 96.163 kg 98.204 kg 101.776 kg      Telemetry Sinus bradycardia with HR 50s - Personally Reviewed  ECG  Sinus bradycardia with HR 50s, no significant ST-T wave changes - Personally Reviewed  Physical Exam  GEN: No acute distress.   Neck: No JVD Cardiac: RRR, no murmurs, rubs, or gallops.  Respiratory: Clear to auscultation bilaterally. GI: Soft, nontender, non-distended  MS: No edema; No deformity. Neuro:  Nonfocal  Psych: Normal affect   Labs High  Sensitivity Troponin:  No results for input(s): TROPONINIHS in the last 720 hours.   Chemistry Recent Labs  Lab 03/16/24 1019 03/20/24 0508  NA 144 142  K 3.5 3.4*  CL 104 107  CO2 21 25  GLUCOSE 135* 98  BUN 11 12  CREATININE 1.27 1.11  CALCIUM  9.0 9.0  GFRNONAA  --  >60  ANIONGAP  --  10    Lipids No results for input(s): CHOL, TRIG, HDL, LABVLDL, LDLCALC, CHOLHDL in the last 168 hours.  Hematology Recent Labs  Lab 03/16/24 1019 03/20/24 0508  WBC 6.3 7.1  RBC 4.01* 4.10*  HGB 12.5* 12.9*  HCT 37.5 36.9*  MCV 94 90.0  MCH 31.2 31.5  MCHC 33.3 35.0  RDW 13.6 13.8  PLT 248 190   Thyroid  No results for input(s): TSH, FREET4 in the last 168 hours.  BNPNo results for input(s): BNP, PROBNP in the last 168 hours.  DDimer No results for input(s): DDIMER in the last 168 hours.   Radiology  CARDIAC CATHETERIZATION Result Date: 03/19/2024   Mid LAD lesion is 30% stenosed.   Ost Cx to Prox Cx lesion is 50% stenosed.   Prox RCA to Mid RCA  lesion is 100% stenosed.   Post intervention, there is a 100% residual stenosis. Unsuccessful CTO PCI of the RCA. Unable to cross lesion with wire due to severe calcification Plan: continue medical therapy. Will observe overnight.   ABORTED INVASIVE LAB PROCEDURE Result Date: 03/19/2024   Mid LAD lesion is 30% stenosed.   Ost Cx to Prox Cx lesion is 50% stenosed.   Prox RCA to Mid RCA lesion is 100% stenosed.   Post intervention, there is a 100% residual stenosis. Unsuccessful CTO PCI of the RCA. Unable to cross lesion with wire due to severe calcification Plan: continue medical therapy. Will observe overnight.    Cardiac Studies  Cath 03/19/2024   Mid LAD lesion is 30% stenosed.   Ost Cx to Prox Cx lesion is 50% stenosed.   Prox RCA to Mid RCA lesion is 100% stenosed.   Post intervention, there is a 100% residual stenosis.   Unsuccessful CTO PCI of the RCA. Unable to cross lesion with wire due to severe  calcification     Plan: continue medical therapy. Will observe overnight.     Patient Profile   63 y.o. male with PMH of CAD, HTN and HLD who presented for CTO PCI of RCA, however procedure was aborted due to heavy calcification. Medical therapy recommended.   Assessment & Plan   CAD  - cath 02/26/2024 showed 50% ost to prox LCx, 30% mid LAD, 100% prox to mid RCA. Toprol  Xl and imdur  added for antianginal purpose. Metoprolol  later stopped for bradycardia?  - due to continuous symptom, patient returned for CTO PCI of RCA  - cath 03/19/2024 showed 30% mid LAD, 50% ost to prox LCx, 100% prox to mid RCA  - started on Renaxa post cath. Tolerating ok. On ASA, plavix  and nexium  at home. Nexium  switched to protonix  due to interaction with plavix . Patient was started on plavix  prior to this case, plavix  was not ordered after the cath. Unclear if he still need plavix  given lac of stent. Plan for D/C today  Hypertension: unable to tolerate BB due to bradycardia. On Imdur   Hyperlipidemia: continue atorvastatin      For questions or updates, please contact [facility] Please consult [email or web address] for contact info under     Signed, Scot Ford, PA  03/20/2024, 11:30 AM     History and all data above reviewed.  I personally took the history today, performed the physical formulated the substantive portion of the assessment and plan.  I reviewed all relevant tests and studies. Patient examined.  He thinks he feels better.  Unable to PCI RCA. No complaints overnight and he walked in his room.   I agree with the findings as above.  The patient exam reveals COR:RRR  ,  Lungs: Clear  ,  Abd: Positive bowel sounds, no rebound no guarding, Ext No edema,  bilateral femoral access sites without bleeding or bruising.    .  All available labs, radiology testing, previous records reviewed. Agree with documented assessment and plan. CAD:  Home with the addition of Renaxa.  No indication for Plavix   He can use  his Nexium .  Follow up has been arranged.   [name]  12:27 PM  03/20/2024

## 2024-03-20 NOTE — Discharge Summary (Signed)
 Discharge Summary   Patient ID: Jeffrey Hooper MRN: [id number]; DOB: Jul 15, 1961  Admit date: 03/19/2024 Discharge date: 03/20/2024  PCP:  Jeffrey Norene [name], DO   [facility] Providers Cardiologist:  Lynwood Schilling, MD       Discharge Diagnoses  Principal Problem:   Angina pectoris [facility]) Active Problems:   Essential hypertension   Dyslipidemia   Hypothyroidism   CAD S/P percutaneous coronary angioplasty   Diagnostic Studies/Procedures   Cath 03/19/2024   Mid LAD lesion is 30% stenosed.   Ost Cx to Prox Cx lesion is 50% stenosed.   Prox RCA to Mid RCA lesion is 100% stenosed.   Post intervention, there is a 100% residual stenosis.   Unsuccessful CTO PCI of the RCA. Unable to cross lesion with wire due to severe calcification     Plan: continue medical therapy. Will observe overnight.    _____________   History of Present Illness   Jeffrey Hooper is a 63 y.o. male with CAD and previous stenting to his LAD.  He had a stress test with inferior infarct with ischemia.     Cardiac cath in July 2025 demonstrated an occluded RCA with patent stent in the LAD and non obstructive circ disease.  He saw Jeffrey Norene [name], DO this week and he had SOB and chest pain.  He has also had bradycardia.  He has had continued chest discomfort really since May when he was in the hospital.  I did look back and troponins were negative both times.  He says the discomfort is an aching and a sharp discomfort under his chest.  Constant.  It can be 6 out of 7 in intensity.  It might wax and wane a little bit.  He has had increasing shortness of breath.  He has had increased lower extremity swelling.  When he saw his primary the other day he was given Lasix  40 mg twice a day and some potassium.  However, with his potassium came back slightly elevated.  When he was discharged from the hospital he was on a low-dose of beta-blocker although he has significant bradycardia.  He was started on low-dose  Imdur .  He really has not had any improvement since this.  He is getting dyspneic walking up a slight incline in his yard.  He is having sometimes when he has to take a deep breath.  He has chronically slept in a recliner because of his back.  This is not any different.   Hospital Course   Consultants: N/A   Patient's case was discussed between Dr. Schilling and the Dr. [place], he presented to the hospital on 03/19/2024 for planned CTO PCI of RCA.  An attempt was made unsuccessfully to open up the RCA, Dr. Swaziland was unable to cross the lesion with wire due to severe calcification.  Therefore medical therapy was recommended.  He was admitted to the hospital overnight for observation.  Ranexa  500 mg twice a day was started.  Since no stent was placed, his Plavix  was discontinued.  Patient was seen in the morning of 03/20/2024 by Dr. Schilling, at which time he was tolerating the Ranexa  without any issue.  He ambulated in the hallway without significant chest discomfort or shortness of breath.  Bilateral radial artery cath sites stable without any bleeding.  He is deemed stable for discharge from the cardiac perspective.     Did the patient have an acute coronary syndrome (MI, NSTEMI, STEMI, etc) this admission?:  No  Did the patient have a percutaneous coronary intervention (stent / angioplasty)?:  No.          _____________  Discharge Vitals Blood pressure 132/66, pulse (!) 51, temperature 97.6 F (36.4 C), temperature source Oral, resp. rate 17, height 5' 10.5 (1.791 m), weight 96.2 kg, SpO2 97%.  Filed Weights   03/19/24 0556  Weight: 96.2 kg    Labs & Radiologic Studies  CBC Recent Labs    03/20/24 0508  WBC 7.1  HGB 12.9*  HCT 36.9*  MCV 90.0  PLT 190   Basic Metabolic Panel Recent Labs    91/84/74 0508  NA 142  K 3.4*  CL 107  CO2 25  GLUCOSE 98  BUN 12  CREATININE 1.11  CALCIUM  9.0   Liver Function Tests No results for input(s): AST,  ALT, ALKPHOS, BILITOT, PROT, ALBUMIN  in the last 72 hours. No results for input(s): LIPASE, AMYLASE in the last 72 hours. High Sensitivity Troponin:   No results for input(s): TROPONINIHS in the last 720 hours.  Recent Labs  Lab 02/29/24 0912  TRNPT 16    BNP Invalid input(s): POCBNP No results for input(s): PROBNP in the last 72 hours.  No results for input(s): BNP in the last 72 hours.  D-Dimer No results for input(s): DDIMER in the last 72 hours. Hemoglobin A1C No results for input(s): HGBA1C in the last 72 hours. Fasting Lipid Panel No results for input(s): CHOL, HDL, LDLCALC, TRIG, CHOLHDL, LDLDIRECT in the last 72 hours. No results found for: LIPOA  Thyroid  Function Tests No results for input(s): TSH, T4TOTAL, T3FREE, THYROIDAB in the last 72 hours.  Invalid input(s): FREET3 _____________  CARDIAC CATHETERIZATION Result Date: 03/19/2024   Mid LAD lesion is 30% stenosed.   Ost Cx to Prox Cx lesion is 50% stenosed.   Prox RCA to Mid RCA lesion is 100% stenosed.   Post intervention, there is a 100% residual stenosis. Unsuccessful CTO PCI of the RCA. Unable to cross lesion with wire due to severe calcification Plan: continue medical therapy. Will observe overnight.   ABORTED INVASIVE LAB PROCEDURE Result Date: 03/19/2024   Mid LAD lesion is 30% stenosed.   Ost Cx to Prox Cx lesion is 50% stenosed.   Prox RCA to Mid RCA lesion is 100% stenosed.   Post intervention, there is a 100% residual stenosis. Unsuccessful CTO PCI of the RCA. Unable to cross lesion with wire due to severe calcification Plan: continue medical therapy. Will observe overnight.   DG Chest 2 View Result Date: 03/09/2024 CLINICAL DATA:  Rales and lower extremity edema EXAM: CHEST - 2 VIEW COMPARISON:  Chest radiograph dated 02/29/2024 FINDINGS: Lines/tubes: Intrathecal leads project over the midthoracic spine at the level of T8. Lungs: Well inflated lungs. No focal  consolidation. Pleura: No pneumothorax or pleural effusion. Heart/mediastinum: Similar mildly enlarged cardiomediastinal silhouette. Bones: Cervical spinal fixation hardware appears intact. IMPRESSION: No active cardiopulmonary disease. Electronically Signed   By: Limin  Xu M.D.   On: 03/09/2024 14:02   DG Chest Port 1 View Result Date: 02/29/2024 CLINICAL DATA:  Shortness of breath EXAM: PORTABLE CHEST 1 VIEW COMPARISON:  Chest radiograph dated 12/23/2023. FINDINGS: No focal consolidation, pleural effusion, or pneumothorax. The cardiac silhouette is within normal limits. Coronary vascular calcification or stent. No acute osseous pathology. IMPRESSION: No active disease. Electronically Signed   By: Vanetta Chou M.D.   On: 02/29/2024 09:46   CARDIAC CATHETERIZATION Result Date: 02/26/2024   Lida Cx to Prox Cx lesion is 50%  stenosed.   Mid LAD lesion is 30% stenosed.   Prox RCA to Mid RCA lesion is 100% stenosed.   The left ventricular systolic function is normal.   LV end diastolic pressure is normal.   The left ventricular ejection fraction is 55-65% by visual estimate. Single vessel occlusive CAD. Continued patency of stent in the LAD. The RCA is occluded in the mid vessel with excellent left to right collaterals from the second diagonal Normal LV function Normal LVEDP Plan: patient has not been on any anti-anginal therapy. Will add Toprol XL 25 mg daily and Imdur 30 mg daily. Stop HCT to allow titration of medication. If he has refractory angina despite optimal medical therapy could consider CTO PCI.    Disposition Pt is being discharged home today in good condition.  Follow-up Plans & Appointments  Follow-up Information     [place]and, Peter M, MD Follow up on 04/09/2024.   Specialty: Cardiology Why: @10AM . Post cath follow up Contact information: [address] [place] [address] [phone]         Lavona Agent, MD Follow up on 05/22/2024.   Specialty: Cardiology Why:  8:40AM. Cardiology follow up Contact information: [address] [place] [address] [phone]                Discharge Instructions     AMB Referral to Cardiac Rehabilitation - Phase II   Complete by: As directed    Diagnosis: Stable Angina   After initial evaluation and assessments completed: Virtual Based Care may be provided alone or in conjunction with Phase 2 Cardiac Rehab based on patient barriers.: Yes   Intensive Cardiac Rehabilitation (ICR) [facility] location only OR Traditional Cardiac Rehabilitation (TCR) *If criteria for ICR are not met will enroll in TCR [facility] only): Yes   Diet - low sodium heart healthy   Complete by: As directed    Discharge instructions   Complete by: As directed    No lifting over 5 lbs for 1 week. No sexual activity for 1 week.  Keep procedure site clean & dry. If you notice increased pain, swelling, bleeding or pus, call/return!  You may shower, but no soaking baths/hot tubs/pools for 1 week.   Increase activity slowly   Complete by: As directed        Discharge Medications Allergies as of 03/20/2024       Reactions   Alpha-gal Rash, Other (See Comments)   SEVERE ALLERGY TO ANY MEAT OR MEAT DERIVED PRODUCTS FROM 4 LEGGED ANIMALS > > BEEF, PORK , GOATS, DEER, ETC. < < RESULT OF BITE FROM LONE STAR TICK   Beef (bovine) Protein Rash, Other (See Comments)   Beef-derived Drug Products Rash, Other (See Comments)   Lambs Quarters Rash, Other (See Comments)   Pork-derived Products Rash, Other (See Comments)   Prednisone Other (See Comments)   im depo medrol caused dizziness - pt passed out. Ask pt before giving. Pt can take oral prednisone    Brilinta [ticagrelor] Hives   Pt ate pork on the same day he took Brilinta before knowing he had alpha gal. May have been the alpha gal reaction, pt is unsure.    Doxycycline Hives   Other Reaction(s): rash/swelling   Gabapentin Swelling   Lyrica [pregabalin]    Dry mouth and felt raw    Methylprednisolone Sodium Succ    im depo medrol caused dizziness - pt passed out. Ask pt before giving. Issue occurred when administered in the left arm  Penicillins Hives   Shellfish Allergy  Hives   Adhesive [tape] Rash   Fentanyl  Rash   Reaction to adhesive, not the drug         Medication List     STOP taking these medications    clopidogrel  75 MG tablet Commonly known as: PLAVIX        TAKE these medications    acetaminophen  500 MG tablet Commonly known as: TYLENOL  Take 1,000 mg by mouth every 8 (eight) hours as needed for moderate pain (pain score 4-6).   albuterol  108 (90 Base) MCG/ACT inhaler Commonly known as: Ventolin  HFA TAKE 2 PUFFS BY MOUTH EVERY 6 HOURS AS NEEDED FOR WHEEZE OR SHORTNESS OF BREATH   AMBULATORY NON FORMULARY MEDICATION Diltiazem 2%/lidocaine  5% apply pea sized amount three times daily for 2 months   aspirin  EC 81 MG tablet Take 81 mg by mouth at bedtime.   atorvastatin  80 MG tablet Commonly known as: LIPITOR  TAKE 1 TABLET BY MOUTH EVERYDAY AT BEDTIME   B-12 Compliance Injection 1000 MCG/ML Kit Generic drug: Cyanocobalamin  Inject 1,000 mcg as directed every 30 (thirty) days.   betamethasone  dipropionate 0.05 % cream Apply 1 Application topically 2 (two) times daily as needed (swelling/ itching).   cetirizine  10 MG tablet Commonly known as: ZYRTEC  Take 10 mg by mouth daily.   EPINEPHrine  0.3 mg/0.3 mL Soaj injection Commonly known as: EPI-PEN Inject 0.3 mg into the muscle as needed for anaphylaxis.   esomeprazole  40 MG capsule Commonly known as: NexIUM  Take 1 capsule (40 mg total) by mouth 2 (two) times daily before a meal.   fluticasone  50 MCG/ACT nasal spray Commonly known as: FLONASE  Place 2 sprays into both nostrils daily.   furosemide  20 MG tablet Commonly known as: LASIX  Take 1 tablet (20 mg total) by mouth daily.   HYDROcodone -acetaminophen  5-325 MG tablet Commonly known as: NORCO/VICODIN Take 1 tablet by mouth  every 6 (six) hours as needed for severe pain (pain score 7-10) or moderate pain (pain score 4-6).   isosorbide  mononitrate 120 MG 24 hr tablet Commonly known as: IMDUR  Take 1 tablet (120 mg total) by mouth daily.   levothyroxine  100 MCG tablet Commonly known as: SYNTHROID  Take 1 tablet (100 mcg total) by mouth daily.   nitroGLYCERIN  0.4 MG SL tablet Commonly known as: NITROSTAT  Place 1 tablet (0.4 mg total) under the tongue every 5 (five) minutes as needed for chest pain.   olmesartan  40 MG tablet Commonly known as: BENICAR  Take 1 tablet (40 mg total) by mouth daily.   ondansetron  4 MG disintegrating tablet Commonly known as: ZOFRAN -ODT TAKE 1 TABLET BY MOUTH EVERY 8 HOURS AS NEEDED FOR NAUSEA AND VOMITING   PARoxetine  20 MG tablet Commonly known as: PAXIL  Take 1 tablet (20 mg total) by mouth daily.   promethazine  25 MG tablet Commonly known as: PHENERGAN  TAKE 0.5-1 TABLETS (12.5-25 MG TOTAL) BY MOUTH 2 (TWO) TIMES DAILY AS NEEDED FOR NAUSEA OR VOMITING.   ranolazine  500 MG 12 hr tablet Commonly known as: RANEXA  Take 1 tablet (500 mg total) by mouth 2 (two) times daily.         Outstanding Labs/Studies  N/A  Duration of Discharge Encounter: APP Time: 10 minutes   Signed, Scot Ford, PA 03/20/2024, 1:47 PM

## 2024-03-21 ENCOUNTER — Other Ambulatory Visit: Payer: Self-pay | Admitting: Student

## 2024-03-21 DIAGNOSIS — I10 Essential (primary) hypertension: Secondary | ICD-10-CM

## 2024-03-21 LAB — LIPOPROTEIN A (LPA): Lipoprotein (a): <8.4 nmol/L (ref <75.0)

## 2024-03-27 ENCOUNTER — Ambulatory Visit: Admitting: Physician Assistant

## 2024-04-04 NOTE — Progress Notes (Unsigned)
 Cardiology Office Note   Date:  04/09/2024  ID:  Jeffrey Hooper, DOB 28-Oct-1960, MRN [id number] PCP: Jeffrey Norene [name], DO  [facility] Providers Cardiologist:  Jeffrey Bahner [place] MD Click to update primary MD,subspecialty MD or APP then REFRESH:1}    History of Present Illness Jeffrey Hooper is a 63 y.o. male is seen for follow up CAD. He has history of prior stent in the LAD in 2017. More recently had abnormal myoview  study showing inferior wall ischemia. This led to cardiac cath showing occlusion of the mid RCA with collaterals. LAD stent was patent. Due to persistent symptoms he had attempted CTO PCI of the RCA on 03/19/24. This was unsuccessful due to inability to cross the lesion with a wire. The lesion was heavily calcified. Ranexa  was added to his medical therapy.   He reports he has had anginal symptoms for the past year. Was admitted in May with severe bradycardia. Toprol  discontinued. Had Myoview  showing inferior wall ischemia which led to cath.  Since his procedure groin areas healed OK. Some bruising but this resolved. Notes improvement in symptoms with addition of Ranexa  but still notes SOB and chest pain with exertion.   ROS: as noted in HPI otherwise negative  Studies Reviewed      Echo 12/24/23: IMPRESSIONS     1. Left ventricular ejection fraction, by estimation, is 60 to 65%. The  left ventricle has normal function. The left ventricle has no regional  wall motion abnormalities. There is mild concentric left ventricular  hypertrophy. Left ventricular diastolic  parameters were normal.   2. Right ventricular systolic function is normal. The right ventricular  size is mildly enlarged. Tricuspid regurgitation signal is inadequate for  assessing PA pressure.   3. Left atrial size was mildly dilated.   4. The mitral valve is normal in structure. Trivial mitral valve  regurgitation.   5. The aortic valve is tricuspid. Aortic valve regurgitation is not  visualized.    6. There is borderline dilatation of the ascending aorta, measuring 39  mm.   7. The inferior vena cava is normal in size with greater than 50%  respiratory variability, suggesting right atrial pressure of 3 mmHg.   Comparison(s): Prior images unable to be directly viewed, comparison made  by report only.   Myoview  02/17/24:  Narrative & Impression      Inferior defect difficult to interpret in setting of adjacent gut radiotracer uptake. Findings suggest probable inferior infarct with mild to moderate peri-infarct ischemia. Overall low to intermediate risk findings.   No ST deviation was noted.   LV perfusion is abnormal. There is a moderate size moderate intensity inferior defect with mild to moderate reversibility. Interpretation is affected by significant adjacent gut radiotracer uptake.   Left ventricular function is normal. End diastolic cavity size is normal.   Cardiac cath 02/26/24:  LEFT HEART CATH AND CORONARY ANGIOGRAPHY   Conclusion      Ost Cx to Prox Cx lesion is 50% stenosed.   Mid LAD lesion is 30% stenosed.   Prox RCA to Mid RCA lesion is 100% stenosed.   The left ventricular systolic function is normal.   LV end diastolic pressure is normal.   The left ventricular ejection fraction is 55-65% by visual estimate.   Single vessel occlusive CAD. Continued patency of stent in the LAD. The RCA is occluded in the mid vessel with excellent left to right collaterals from the second diagonal Normal LV function Normal LVEDP  Risk Assessment/Calculations          Physical Exam VS:  BP 120/68   Pulse 61   Ht 5' 10.5 (1.791 m)   Wt 212 lb 9.6 oz (96.4 kg)   SpO2 97%   BMI 30.07 kg/m        Wt Readings from Last 3 Encounters:  04/09/24 212 lb 9.6 oz (96.4 kg)  03/19/24 212 lb (96.2 kg)  03/13/24 216 lb 8 oz (98.2 kg)    GEN: Well nourished, well developed in no acute distress NECK: No JVD; No carotid bruits CARDIAC: RRR, no murmurs, rubs,  gallops RESPIRATORY:  Clear to auscultation without rales, wheezing or rhonchi  ABDOMEN: Soft, non-tender, non-distended. Groin areas without hematoma EXTREMITIES:  No edema; No deformity   ASSESSMENT AND PLAN CAD with prior stent of LAD in 2017. Anginal symptoms for the past year with abnormal Myoview  showing inferior perfusion abnormality. Has CTO of the mid RCA. Patent LAD stent. 50% LCx. Unsuccessful attempt at PCI of RCA. Recommend medical therapy. On maximal Imdur  dose. Intolerant of beta blocker due to bradycardia. Will increase ranexa  to 1000 mg bid since this has helped. BP is well controlled. Discussed liberal use of sl Ntg as needed. Other option would be CABG but I would not recommend this for single vessel disease. If CAD progresses in future in LAD then CABG would be indicated.  HLD. Goal LDL < 55. On lipitor  80 mg. Will add Zetia  and repeat lipid panel in 2 months. If not at goal would consider Repatha.  HTN controlled.     Cardiac Rehabilitation Eligibility Assessment  The patient is ready to start cardiac rehabilitation from a cardiac standpoint.       Dispo: follow up with me in 3 months.  Signed, Jeffrey Severt [place], MD

## 2024-04-07 ENCOUNTER — Ambulatory Visit: Admitting: *Deleted

## 2024-04-07 DIAGNOSIS — E538 Deficiency of other specified B group vitamins: Secondary | ICD-10-CM | POA: Diagnosis not present

## 2024-04-07 NOTE — Progress Notes (Signed)
 Patient is in office today for a nurse visit for B12 Injection. Patient Injection was given in the  Right deltoid. Patient tolerated injection well.

## 2024-04-09 ENCOUNTER — Ambulatory Visit: Attending: Cardiology | Admitting: Cardiology

## 2024-04-09 ENCOUNTER — Encounter: Payer: Self-pay | Admitting: Cardiology

## 2024-04-09 VITALS — BP 120/68 | HR 61 | Ht 70.5 in | Wt 212.6 lb

## 2024-04-09 DIAGNOSIS — I25118 Atherosclerotic heart disease of native coronary artery with other forms of angina pectoris: Secondary | ICD-10-CM | POA: Diagnosis present

## 2024-04-09 DIAGNOSIS — E785 Hyperlipidemia, unspecified: Secondary | ICD-10-CM | POA: Insufficient documentation

## 2024-04-09 MED ORDER — EZETIMIBE 10 MG PO TABS: 10.0000 mg | ORAL_TABLET | Freq: Every day | ORAL | 3 refills | Status: AC

## 2024-04-09 MED ORDER — RANOLAZINE ER 1000 MG PO TB12: 1000.0000 mg | ORAL_TABLET | Freq: Two times a day (BID) | ORAL | 3 refills | Status: DC

## 2024-04-09 NOTE — Patient Instructions (Signed)
 Medication Instructions:  Increase Ranxexa to 1000 mg twice a day Start Zetia  10 mg daily Continue all other medications *If you need a refill on your cardiac medications before your next appointment, please call your pharmacy*  Lab Work: Have fasting labs bmet,lipid and hepatic panels  done in 2 months   Testing/Procedures: None ordered  Follow-Up: At [facility], you and your health needs are our priority.  As part of our continuing mission to provide you with exceptional heart care, our providers are all part of one team.  This team includes your primary Cardiologist (physician) and Advanced Practice Providers or APPs (Physician Assistants and Nurse Practitioners) who all work together to provide you with the care you need, when you need it.  Your next appointment:  Thursday  12/4 at 8:20 am    Provider:  Dr.Jordan   We recommend signing up for the patient portal called MyChart.  Sign up information is provided on this After Visit Summary.  MyChart is used to connect with patients for Virtual Visits (Telemedicine).  Patients are able to view lab/test results, encounter notes, upcoming appointments, etc.  Non-urgent messages can be sent to your provider as well.   To learn more about what you can do with MyChart, go to [email or web address].

## 2024-04-16 MED ORDER — AMLODIPINE BESYLATE 5 MG PO TABS
5.0000 mg | ORAL_TABLET | Freq: Every day | ORAL | 3 refills | Status: DC
Start: 2024-04-16 — End: 2024-07-15

## 2024-04-16 NOTE — Telephone Encounter (Signed)
 Called and spoke with patient regarding MyChart message.  He states he has taken amlodipine  in the past but had to stop because it caused swelling. He is also concerned about taking this new Rx of amlodipine  because his BP today is 116/45, HR 54 in left arm and 5 minutes later 117/45, HR 55 in right arm.  He continues to have SOB and chest pain/pressure, worse when bending over. He has taken NTG x2 today.  Patient would like to know if there is anything else he can take to help with chest pain/SOB that won't also lower his BP more.  Will forward to Dr. [place] to review and advise.

## 2024-04-17 ENCOUNTER — Other Ambulatory Visit: Payer: Self-pay

## 2024-04-17 NOTE — Telephone Encounter (Signed)
 Spoke to patient Dr.Jordan's advice given.He will take Amlodipine  2.5 mg daily.He will call back if chest pain continues.

## 2024-04-24 ENCOUNTER — Emergency Department (HOSPITAL_COMMUNITY)
Admission: EM | Admit: 2024-04-24 | Discharge: 2024-04-24 | Disposition: A | Discharge status: Home / Self Care | Attending: Emergency Medicine | Admitting: Emergency Medicine

## 2024-04-24 ENCOUNTER — Emergency Department (HOSPITAL_COMMUNITY)

## 2024-04-24 ENCOUNTER — Encounter (HOSPITAL_COMMUNITY): Payer: Self-pay

## 2024-04-24 ENCOUNTER — Telehealth: Payer: Self-pay | Admitting: Cardiology

## 2024-04-24 ENCOUNTER — Other Ambulatory Visit: Payer: Self-pay

## 2024-04-24 DIAGNOSIS — R079 Chest pain, unspecified: Secondary | ICD-10-CM

## 2024-04-24 DIAGNOSIS — E785 Hyperlipidemia, unspecified: Secondary | ICD-10-CM | POA: Diagnosis not present

## 2024-04-24 DIAGNOSIS — Z7982 Long term (current) use of aspirin: Secondary | ICD-10-CM | POA: Insufficient documentation

## 2024-04-24 DIAGNOSIS — R0789 Other chest pain: Secondary | ICD-10-CM | POA: Insufficient documentation

## 2024-04-24 DIAGNOSIS — I10 Essential (primary) hypertension: Secondary | ICD-10-CM

## 2024-04-24 LAB — CBC
HCT: 39.7 % (ref 39.0–52.0)
Hemoglobin: 13.4 g/dL (ref 13.0–17.0)
MCH: 31.5 pg (ref 26.0–34.0)
MCHC: 33.8 g/dL (ref 30.0–36.0)
MCV: 93.2 fL (ref 80.0–100.0)
Platelets: 216 K/uL (ref 150–400)
RBC: 4.26 MIL/uL (ref 4.22–5.81)
RDW: 12.8 % (ref 11.5–15.5)
WBC: 6.6 K/uL (ref 4.0–10.5)
nRBC: 0 % (ref 0.0–0.2)

## 2024-04-24 LAB — BASIC METABOLIC PANEL WITH GFR
Anion gap: 9 (ref 5–15)
BUN: 25 mg/dL — ABNORMAL HIGH (ref 8–23)
CO2: 24 mmol/L (ref 22–32)
Calcium: 9.1 mg/dL (ref 8.9–10.3)
Chloride: 103 mmol/L (ref 98–111)
Creatinine, Ser: 1.27 mg/dL — ABNORMAL HIGH (ref 0.61–1.24)
GFR, Estimated: >60 mL/min (ref >60)
Glucose, Bld: 101 mg/dL — ABNORMAL HIGH (ref 70–99)
Potassium: 3.7 mmol/L (ref 3.5–5.1)
Sodium: 136 mmol/L (ref 135–145)

## 2024-04-24 LAB — TROPONIN I (HIGH SENSITIVITY)
Troponin I (High Sensitivity): 4 ng/L (ref <18)
Troponin I (High Sensitivity): 5 ng/L (ref <18)

## 2024-04-24 MED ORDER — AMLODIPINE BESYLATE 5 MG PO TABS: 2.5000 mg | ORAL_TABLET | Freq: Two times a day (BID) | ORAL | 0 refills | Status: DC

## 2024-04-24 NOTE — Consult Note (Addendum)
 Cardiology Consult   Patient ID: Jeffrey Hooper MRN: [id number]; DOB: [birth year]   Admission date: 04/24/2024  PCP:  [name], DO   [facility] Providers Cardiologist:  Lynwood Schilling, MD       Chief Complaint:  chest pain and shortness of breath   Patient Profile: Jeffrey Hooper is a 63 y.o. male with CAD s/p DES in 11/2015, paroxysmal SVT, hypertension, hyperlipidemia, hypothyroidism, GERD, and alpha gal allergy  who is being seen 04/24/2024 for the evaluation of chest pain and shortness of breath.  History of Present Illness: Jeffrey Hooper is a 63 year old with the above history who is followed by Dr. Schilling primarily for CAD. He underwent PCI with DES to 11/2015. He has had multiple ischemic evaluations since then. LHC in 02/2024 showed patent LAD stent with CTO of proximal to mid RCA with excellent left to right collaterals. CTO intervention was attempted on 03/19/2024 with Dr. Swaziland but lesion was unable to be crossed with wire due to severe calcification. Medical therapy was recommended and patient was started on Ranexa .   He was recently seen by Dr. Swaziland on 04/09/2024 at which time he was still having some shortness of breath and chest pain with exertion but symptoms overall improved with Ranexa . Therefore, Ranexa  increased to 1,000mg  twice daily.  Patient presents to the ED today for further evaluation of chest pain and shortness of breath. EKG showed normal sinus rhythm with no acute ischemic changes. Initial high-sensitivity troponin negative at 4. Delta pending. Chest x-ray showed no acute ischemic changes. WBC 6.6, Hgb 13.4, Plts 216. Na 136, K 3.7, Glucose 101, BUN 25, Cr 1.27.   Patient states he has been having almost constant chest pain that he describes as a chest pressure/ tightness. He ranks this as a 5/10 and thinks it has gotten slightly worse since he last saw Dr. Swaziland. He thinks it may be slightly worse with exertion but not significantly worse. He  also reports dyspnea with minimal activity such as folding close or vacuuming. He occasionally gets shortness of breath at rest as well but it is mostly with exertion. This is a little more frequent than before. No new or worsening orthopnea. No PND or edema. He does have palpitations that he describes as brief episodes of heart racing as well as occasional lightheadedness that he is not sure are correlated with the palpitations. He wore a monitor for these symptoms in 12/2023 which showed occasional brief runs of SVT/ PAT (longest run 16 beats) but no sustained arrhythmias. No severe dizziness or syncope. No abnormal bleeding in urine or stools.    Currently antianginals include Amlodipine  2.5mg  daily, Imdur  120mg  daily, and Ranexa  1,00mg  twice daily. He has been using his sublingual Nitroglycerin  liberally as directed by Dr. Swaziland. He states he has been taking 3-5 sublingual Nitroglycerin  on a daily basis for the last 2 weeks (never more than one at a time).  Past Medical History:  Diagnosis Date   Allergy     Allergy  to alpha-gal    Anxiety    Arthritis    CAD (coronary artery disease), native coronary artery 12/03/2015   Coronary artery disease    Dysrhythmia    irregular and PAC'S   GERD (gastroesophageal reflux disease)    Head injury with loss of consciousness (HCC)    back in the 1980's   History of kidney stones    Hyperlipidemia    Hypertension    Hypothyroid    Multiple benign nevi  Neuromuscular disorder (HCC) 2011   LEFT RADIAL NERVE SURGERY R/T TRAUMA   Pneumonia 2014, 2017   Prostatism    Pulmonary nodule    Spinal cord stimulator status    told to bring remote to surgery   Past Surgical History:  Procedure Laterality Date   BACK SURGERY     T12 - L1, 11 times including neck   CARDIAC CATHETERIZATION N/A 12/02/2015   Procedure: Left Heart Cath and Coronary Angiography;  Surgeon: Peter M Swaziland, MD;  Location: [facility] INVASIVE CV LAB;  Service: Cardiovascular;   Laterality: N/A;   CARDIAC CATHETERIZATION N/A 12/02/2015   Procedure: Coronary Stent Intervention;  Surgeon: Peter M Swaziland, MD;  Location: [facility] INVASIVE CV LAB;  Service: Cardiovascular;  Laterality: N/A;  mid LAD Promus 2.5x12   CERVICAL FUSION  2023   C4 and C5   COLONOSCOPY     CORONARY ANGIOPLASTY     one stent placed by Dr. P Swaziland   CORONARY CTO INTERVENTION N/A 03/19/2024   Procedure: CORONARY CTO INTERVENTION;  Surgeon: Swaziland, Peter M, MD;  Location: [facility] INVASIVE CV LAB;  Service: Cardiovascular;  Laterality: N/A;   HARDWARE REMOVAL Right 08/26/2017   Procedure: Right Lumbar Five Revision of pedicle screw with Removal of Lumbar Five Screw;  Surgeon: Colon Shove, MD;  Location: [facility] OR;  Service: Neurosurgery;  Laterality: Right;  posterior   HARDWARE REMOVAL Right 12/27/2017   Procedure: Right Lumbar Two, Lumbar Three, Lumbar Four Pedicle screw removal with metrex;  Surgeon: Colon Shove, MD;  Location: [facility] OR;  Service: Neurosurgery;  Laterality: Right;  Right L2 to L4 Pedicle screw removal with mterex   HEMORRHOID BANDING     HERNIA REPAIR     LAPAROSCOPY N/A 09/01/2020   Procedure: LAPAROSCOPY DIAGNOSTIC;  Surgeon: Debby Hila, MD;  Location: [facility] ORS;  Service: General;  Laterality: N/A;   LEFT HEART CATH AND CORONARY ANGIOGRAPHY N/A 02/26/2024   Procedure: LEFT HEART CATH AND CORONARY ANGIOGRAPHY;  Surgeon: Swaziland, Peter M, MD;  Location: [facility] INVASIVE CV LAB;  Service: Cardiovascular;  Laterality: N/A;   MASS EXCISION  06/25/2012   Procedure: EXCISION MASS;  Surgeon: Arley JONELLE Curia, MD;  Location: [facility];  Service: Orthopedics;  Laterality: Left;  transection of NEUROMA, BURYING RADIAL NERVE IN BRACHIORADIALIS LEFT SIDE   radial nerve Left    cut in work injury   RIGHT GREAT TOENAIL REMOVAL  04/2020   SPINAL FUSION     C6-7   TONSILLECTOMY  1982   UMBILICAL HERNIA REPAIR N/A 09/01/2020   Procedure: PRIMARY UMBILICAL HERNIA REPAIR;  Surgeon: Debby Hila,  MD;  Location: [facility] ORS;  Service: General;  Laterality: N/A;   VASECTOMY       Medications Prior to Admission: Prior to Admission medications   Medication Sig Start Date End Date Taking? Authorizing Provider  acetaminophen  (TYLENOL ) 500 MG tablet Take 1,000 mg by mouth every 8 (eight) hours as needed for moderate pain (pain score 4-6).    [provider]  albuterol  (VENTOLIN  HFA) 108 (90 Base) MCG/ACT inhaler TAKE 2 PUFFS BY MOUTH EVERY 6 HOURS AS NEEDED FOR WHEEZE OR SHORTNESS OF BREATH 01/30/23   Jolinda Potter M, DO  AMBULATORY NON FORMULARY MEDICATION Diltiazem 2%/lidocaine  5% apply pea sized amount three times daily for 2 months 01/21/24   Arletta, Camie BRAVO, PA-C  amLODipine  (NORVASC ) 5 MG tablet Take 1/2 tablet daily 04/17/24   Swaziland, Peter M, MD  aspirin  EC 81 MG tablet Take 81 mg by  mouth at bedtime.    [provider]  atorvastatin  (LIPITOR ) 80 MG tablet TAKE 1 TABLET BY MOUTH EVERYDAY AT BEDTIME 08/30/23   Jolinda Potter M, DO  betamethasone  dipropionate 0.05 % cream Apply 1 Application topically 2 (two) times daily as needed (swelling/ itching).    [provider]  cetirizine  (ZYRTEC ) 10 MG tablet Take 10 mg by mouth daily.    [provider]  Cyanocobalamin  (B-12 COMPLIANCE INJECTION) 1000 MCG/ML KIT Inject 1,000 mcg as directed every 30 (thirty) days.    [provider]  EPINEPHrine  0.3 mg/0.3 mL IJ SOAJ injection Inject 0.3 mg into the muscle as needed for anaphylaxis. 02/05/24   Lavona Agent, MD  esomeprazole  (NEXIUM ) 40 MG capsule Take 1 capsule (40 mg total) by mouth 2 (two) times daily before a meal. 01/21/24   Heinz, Camie BRAVO, PA-C  ezetimibe  (ZETIA ) 10 MG tablet Take 1 tablet (10 mg total) by mouth daily. 04/09/24 07/08/24  Swaziland, Peter M, MD  fluticasone  (FLONASE ) 50 MCG/ACT nasal spray Place 2 sprays into both nostrils daily. 06/14/20   Jolinda Potter [name], DO  furosemide  (LASIX ) 20 MG tablet Take 1 tablet (20 mg total) by mouth daily.  03/13/24   Lavona Agent, MD  HYDROcodone -acetaminophen  (NORCO/VICODIN) 5-325 MG tablet Take 1 tablet by mouth every 6 (six) hours as needed for severe pain (pain score 7-10) or moderate pain (pain score 4-6). 02/29/24   Ula Prentice SAUNDERS, MD  isosorbide  mononitrate (IMDUR ) 120 MG 24 hr tablet Take 1 tablet (120 mg total) by mouth daily. 03/13/24   Lavona Agent, MD  levothyroxine  (SYNTHROID ) 100 MCG tablet Take 1 tablet (100 mcg total) by mouth daily. 08/30/23   Jolinda Potter [name], DO  nitroGLYCERIN  (NITROSTAT ) 0.4 MG SL tablet Place 1 tablet (0.4 mg total) under the tongue every 5 (five) minutes as needed for chest pain. 02/05/24   Lavona Agent, MD  olmesartan  (BENICAR ) 40 MG tablet Take 1 tablet (40 mg total) by mouth daily. 12/09/23   Rana Lum CROME, NP  ondansetron  (ZOFRAN -ODT) 4 MG disintegrating tablet TAKE 1 TABLET BY MOUTH EVERY 8 HOURS AS NEEDED FOR NAUSEA AND VOMITING 08/30/23   Jolinda Potter M, DO  PARoxetine  (PAXIL ) 20 MG tablet Take 1 tablet (20 mg total) by mouth daily. 08/30/23   Jolinda Potter [name], DO  promethazine  (PHENERGAN ) 25 MG tablet TAKE 0.5-1 TABLETS (12.5-25 MG TOTAL) BY MOUTH 2 (TWO) TIMES DAILY AS NEEDED FOR NAUSEA OR VOMITING. 11/15/21   Jolinda Potter M, DO  ranolazine  (RANEXA ) 1000 MG SR tablet Take 1 tablet (1,000 mg total) by mouth 2 (two) times daily. 04/09/24   Swaziland, Peter M, MD     Allergies:    Allergies  Allergen Reactions   Alpha-Gal Rash and Other (See Comments)    SEVERE ALLERGY  TO ANY MEAT OR MEAT DERIVED PRODUCTS FROM 4 LEGGED ANIMALS > > BEEF, PORK , GOATS, DEER, ETC. < < RESULT OF BITE FROM LONE STAR TICK   Beef (Bovine) Protein Rash and Other (See Comments)   Beef-Derived Drug Products Rash and Other (See Comments)   Lambs Quarters Rash and Other (See Comments)   Pork-Derived Products Rash and Other (See Comments)   Prednisone  Other (See Comments)    im depo medrol  caused dizziness - pt passed out. Ask pt before giving. Pt can take oral  prednisone     Brilinta  [Ticagrelor ] Hives    Pt ate pork on the same day he took Brilinta  before knowing he had alpha gal. May have  been the alpha gal reaction, pt is unsure.    Doxycycline Hives    Other Reaction(s): rash/swelling   Gabapentin  Swelling   Lyrica [Pregabalin]     Dry mouth and felt raw   Methylprednisolone  Sodium Succ     im depo medrol  caused dizziness - pt passed out. Ask pt before giving. Issue occurred when administered in the left arm    Penicillins Hives   Shellfish Allergy  Hives   Adhesive [Tape] Rash   Fentanyl  Rash    Reaction to adhesive, not the drug     Social History:   Social History   Socioeconomic History   Marital status: Married    Spouse name: Not on file   Number of children: 3   Years of education: Not on file   Highest education level: GED or equivalent  Occupational History   Occupation: [employer]  Tobacco Use   Smoking status: Former    Current packs/day: 0.00    Average packs/day: 1 pack/day for 20.0 years (20.0 ttl pk-yrs)    Types: Cigarettes    Start date: 11/28/1986    Quit date: 11/28/2006    Years since quitting: 17.4   Smokeless tobacco: Never  Vaping Use   Vaping status: Never Used  Substance and Sexual Activity   Alcohol use: Not Currently    Comment: rarely   Drug use: No   Sexual activity: Not Currently  Other Topics Concern   Not on file  Social History Narrative   Lives at home with wife, not working disabled from the city of [place]   They have an adopted son at home (age 64  2022)   Former smoker no alcohol tobacco or drug use now   Social Drivers of Corporate investment banker Strain: Low Risk  (03/06/2024)   Overall Financial Resource Strain (CARDIA)    Difficulty of Paying Living Expenses: Not hard at all  Food Insecurity: No Food Insecurity (03/19/2024)   Hunger Vital Hooper    Worried About Running Out of Food in the Last Year: Never true    Ran Out of Food in the Last Year: Never true   Transportation Needs: No Transportation Needs (03/19/2024)   PRAPARE - Administrator, Civil Service (Medical): No    Lack of Transportation (Non-Medical): No  Physical Activity: Unknown (03/06/2024)   Exercise Vital Hooper    Days of Exercise per Week: Patient declined    Minutes of Exercise per Session: Not on file  Stress: No Stress Concern Present (03/06/2024)   [employer] of Occupational Health - Occupational Stress Questionnaire    Feeling of Stress: Not at all  Social Connections: Socially Integrated (03/06/2024)   Social Connection and Isolation Panel    Frequency of Communication with Friends and Family: More than three times a week    Frequency of Social Gatherings with Friends and Family: Three times a week    Attends Religious Services: More than 4 times per year    Active Member of Clubs or Organizations: Yes    Attends Banker Meetings: More than 4 times per year    Marital Status: Married  Catering manager Violence: Not At Risk (03/19/2024)   Humiliation, Afraid, Rape, and Kick questionnaire    Fear of Current or Ex-Partner: No    Emotionally Abused: No    Physically Abused: No    Sexually Abused: No     Family History:   The patient's family  history includes Congestive Heart Failure in his mother; Heart attack in his paternal grandfather and paternal grandmother; Heart attack (age of onset: 65) in his father; Heart failure (age of onset: 82) in his mother. There is no history of Colon cancer, Esophageal cancer, Pancreatic cancer, or Stomach cancer.    ROS:  Please see the history of present illness.   Physical Exam/Data: Vitals:   04/24/24 1424 04/24/24 1542 04/24/24 1600 04/24/24 1615  BP:   134/70 127/67  Pulse:   (!) 55 (!) 54  Resp:   10 11  Temp:      SpO2:  100% 99% 98%  Weight: 93 kg     Height: 5' 10 (1.778 m)      No intake or output data in the 24 hours ending 04/24/24 1730    04/24/2024    2:24 PM 04/09/2024    9:57 AM  03/19/2024    5:56 AM  Last 3 Weights  Weight (lbs) 205 lb 212 lb 9.6 oz 212 lb  Weight (kg) 92.987 kg 96.435 kg 96.163 kg     Body mass index is 29.41 kg/m.  General: 63 y.o. Caucasian male resting comfortably in no acute distress. HEENT: Normocephalic and atraumatic. Sclera clear.  Neck: Supple. No JVD. Heart: RRR. Distinct S1 and S2. No murmurs, gallops, or rubs.  Lungs: No increased work of breathing. Clear to ausculation bilaterally. No wheezes, rhonchi, or rales.  Abdomen: Soft, non-distended, and non-tender to palpation.  Extremities: No lower extremity edema.  Skin: Warm and dry. Neuro: Alert and oriented x3. No focal deficits. Psych: Normal affect. Responds appropriately.  EKG:  The ECG that was done was personally reviewed and demonstrates normal sinus rhythm with no acute ischemic changes.  Telemetry: Telemetry personally reviewed and demonstrates sinus rhythm with rates in the 50s.  Relevant CV Studies:  Echocardiogram 12/24/2023: Impressions: 1. Left ventricular ejection fraction, by estimation, is 60 to 65%. The  left ventricle has normal function. The left ventricle has no regional  wall motion abnormalities. There is mild concentric left ventricular  hypertrophy. Left ventricular diastolic  parameters were normal.   2. Right ventricular systolic function is normal. The right ventricular  size is mildly enlarged. Tricuspid regurgitation signal is inadequate for  assessing PA pressure.   3. Left atrial size was mildly dilated.   4. The mitral valve is normal in structure. Trivial mitral valve  regurgitation.   5. The aortic valve is tricuspid. Aortic valve regurgitation is not  visualized.   6. There is borderline dilatation of the ascending aorta, measuring 39  mm.   7. The inferior vena cava is normal in size with greater than 50%  respiratory variability, suggesting right atrial pressure of 3 mmHg.  _______________  Monitor 12/25/2023 to  01/06/2024: Predominant rhythm normal sinus rhythm Occasional supraventricular tachycardia brief runs with the longest being 16 beats. Possible brief runs of atrial tachycardia. No sustained arrhythmias. _______________  Left Cardiac Catheterization 02/26/2024:   Ost Cx to Prox Cx lesion is 50% stenosed.   Mid LAD lesion is 30% stenosed.   Prox RCA to Mid RCA lesion is 100% stenosed.   The left ventricular systolic function is normal.   LV end diastolic pressure is normal.   The left ventricular ejection fraction is 55-65% by visual estimate.   Single vessel occlusive CAD. Continued patency of stent in the LAD. The RCA is occluded in the mid vessel with excellent left to right collaterals from the second diagonal Normal LV function  Normal LVEDP   Plan: patient has not been on any anti-anginal therapy. Will add Toprol  XL 25 mg daily and Imdur  30 mg daily. Stop HCT to allow titration of medication. If he has refractory angina despite optimal medical therapy could consider CTO PCI.   Diagnostic Dominance: Right  _______________  CTO Intervention:   Mid LAD lesion is 30% stenosed.   Ost Cx to Prox Cx lesion is 50% stenosed.   Prox RCA to Mid RCA lesion is 100% stenosed.   Post intervention, there is a 100% residual stenosis.   Unsuccessful CTO PCI of the RCA. Unable to cross lesion with wire due to severe calcification     Plan: continue medical therapy. Will observe overnight.   Laboratory Data: High Sensitivity Troponin:   Recent Labs  Lab 04/24/24 1427  TROPONINIHS 4      Chemistry Recent Labs  Lab 04/24/24 1427  NA 136  K 3.7  CL 103  CO2 24  GLUCOSE 101*  BUN 25*  CREATININE 1.27*  CALCIUM  9.1  GFRNONAA >60  ANIONGAP 9    No results for input(s): PROT, ALBUMIN , AST, ALT, ALKPHOS, BILITOT in the last 168 hours. Lipids No results for input(s): CHOL, TRIG, HDL, LABVLDL, LDLCALC, CHOLHDL in the last 168 hours. Hematology Recent Labs   Lab 04/24/24 1427  WBC 6.6  RBC 4.26  HGB 13.4  HCT 39.7  MCV 93.2  MCH 31.5  MCHC 33.8  RDW 12.8  PLT 216   Thyroid  No results for input(s): TSH, FREET4 in the last 168 hours. BNPNo results for input(s): BNP, PROBNP in the last 168 hours.  DDimer No results for input(s): DDIMER in the last 168 hours.  Radiology/Studies:  DG Chest 2 View Result Date: 04/24/2024 CLINICAL DATA:  Chest pain. EXAM: CHEST - 2 VIEW COMPARISON:  March 09, 2024. FINDINGS: The heart size and mediastinal contours are within normal limits. Both lungs are clear. The visualized skeletal structures are unremarkable. IMPRESSION: No active cardiopulmonary disease. Electronically Signed   By: Lynwood Landy Raddle M.D.   On: 04/24/2024 15:10     Assessment and Plan:  Chest Pain and Dyspnea CAD History of DES to LAD in 2017. LHC in 02/2024 showed patent LAD stent with CTO of proximal to mid RCA with excellent left to right collaterals. CTO intervention was attempted on 03/19/2024 with Dr. Swaziland but lesion was unable to be crossed with wire due to severe calcification. Medical therapy was recommended. Presents today with persistent chest pain that is almost constant as well as dyspnea with minimal exertion. EKG shows no acute ischemic changes. High-sensitivity troponin negative at 4. Delta pending.  - Symptoms slightly worse compared to when he last saw Dr. Swaziland 2 weeks ago. - Current antianginals: Amlodipine  2.5mg  daily, Imdur  120mg  daily, and Ranexa  1,00mg  twice daily. Previously on Amlodipine  5mg  daily but had pretty significant edema with this. Can try 2.5mg  twice daily to see if that helps his symptoms without causing edema.  - Continue Aspirin  81mg  daily.  - Continue Lipitor  80mg  daily and Zetia  10mg  daily. - Per Dr. Swaziland, the only other option at this point is a single vessel CABG. If second troponin is negative, I think he can go home with adjustment in antianginal therapy and then possible outpatient  referral to CT Surgery. Will review with MD.  Hypertension BP well controlled.  - Current medications: Amlodipine  2.5mg  daily, Imdur  120mg  daily, and Olmesartan  40mg  daily.  - Can increase Amlodipine  to 2.5mg  twice daily as above.  -  Continue current dose of Imdur  and Olmesartan .  Hyperlipidemia Lipid panel in 12/2023: Total Cholesterol 142, Triglycerides 133, HDL 46, LDL 69. Lipoprotein (a) in 03/2024 negative. - Continue Lipitor  80mg  daily. - Recent started on Zetia  10mg  daily at office visit early this month. Continue.    Risk Assessment/Risk Scores:  TIMI Risk Score for Unstable Angina or Non-ST Elevation MI:   The patient's TIMI risk score is 5, which indicates a 26% risk of all cause mortality, new or recurrent myocardial infarction or need for urgent revascularization in the next 14 days.{  For questions or updates, please contact [facility] Please consult [email or web address] for contact info under       Signed, Callie E Goodrich, PA-C  04/24/2024 5:30 PM    I have personally evaluated and examined the patient. The history, physical exam, and medical decision making documented below were performed independently and substantively by me. I have reviewed all relevant data, formulated the assessment and plan, and assumed responsibility for the management of this patient. My documentation reflects the substantive portion of the split/shared visit, in accordance with CMS and CPT guidelines. I have updated the NPP documentation above as appropriate.  I have personally performed the substantive portion of the medical decision making, including interpretation of diagnostic data, formulation of the management plan, and assessment of risks. In summation:  Jeffrey Hooper is a 63 yo M with coronary artery disease and chronic total occlusion, presents with chest pain. He is accompanied by his brother and son.  He has a history of coronary artery disease with complex vessel anatomy and  chronic total occlusion. He experiences persistent angina chest pain and is currently on Ranexa  with an increased dose, alongside beta-blockers targeting a heart rate of fifty. He is also on longstanding nitrates, specifically isosorbide  120 mg daily.  He presented with chest pain as part of an evaluation for coronary artery disease. He has been taking ntitrates liberally. The first troponin assay was normal, and the second assay is pending (drawn at time of my exam)  He reports being comfortable as long as he is at rest.  Exam notable for  Gen: no distress   Neck: No JVD Ears:  Jeffrey Hooper Cardiac: No Rubs or Gallops, no murmur, RRR +2 radial pulses Respiratory: Clear to auscultation bilaterally, normal effort, normal  respiratory rate GI: Soft, nontender, non-distended  MS: No  edema;  moves all extremities Integument: Skin feels wawrm Neuro:  At time of evaluation, alert and oriented to person/place/time/situation  Psych: Normal affect, patient feels ok  Personally reviewed data and interpretation:   EKG: Sinus rhythm, unchanged Tele: SR Troponin Negative  In assessment and plan:   Coronary artery disease with chronic total occlusion and refractory angina Jeffrey Hooper has coronary artery disease with complex vessel anatomy and chronic total occlusion. He experiences persistent angina despite maximized medical therapy, including Ranexa  with dose escalation and beta-blockade targeting a heart rate of 50 bpm. He is also on isosorbide  120 mg daily. EKG shows sinus rhythm with no significant changes, and the first troponin assay was normal, with the second assay pending. He is comfortable at rest. Given his maximized medical therapy, options are limited. Hospitalization is not expected to expedite evaluation for CABG, and the long-term benefit of admission is unclear. Will send a message to cardiothoracic surgery and Dr. Swaziland for consideration of one vessel bypass if planned. - Continue  current anti-anginal therapy. - Increase amlodipine  to 2.5 mg BID due to blood pressure room and  previous leg swelling on 5 mg daily. - pending second troponin  Medication-induced leg swelling HTN Appropriate bradycardia Jeffrey Hooper has experienced prominent leg swelling when on amlodipine  5 mg daily. This side effect is being managed by adjusting the dosage of amlodipine  to 2.5 mg BID to balance blood pressure control and minimize swelling. - Adjust amlodipine  dosage to 2.5 mg BID to manage leg swelling.  QTC is 458 ms on high dose Ranexa ; no ectopy at time of eval  Jeffrey Leavens, MD FASE [facility] Cardiologist [facility]  [address] [place], [address] [place], [place] [address] [phone]  6:50 PM

## 2024-04-24 NOTE — ED Triage Notes (Signed)
 C/o chest pain and sob onset several days ago ,states he called his cards today and was told to come the ED for further eval.

## 2024-04-24 NOTE — Telephone Encounter (Signed)
 Pt requesting callback for advise on a medication he started. He is still having chest pains. Please advise.     Pt c/o of Chest Pain: STAT if active CP, including tightness, pressure, jaw pain, radiating pain to shoulder/upper arm/back, CP unrelieved by Nitro. Symptoms reported of SOB, nausea, vomiting, sweating.  1. Are you having CP right now? Yes     2. Are you experiencing any other symptoms (ex. SOB, nausea, vomiting, sweating)? SOB, sweating   3. Is your CP continuous or coming and going? Continuous    4. Have you taken Nitroglycerin ? Yes, 2 today    5. How long have you been experiencing CP? Since last appt     6. If NO CP at time of call then end call with telling Pt to call back or call 911 if Chest pain returns prior to return call from triage team.

## 2024-04-24 NOTE — ED Notes (Signed)
 Pt last took 0.4 nitroglycerin  at 1200

## 2024-04-24 NOTE — Telephone Encounter (Signed)
 Spoke to patient he stated he continues to have chest pain everyday even after he increased Ranexa  to 1000 mg twice a day.He has took 4 NTG this morning.He is presently having chest pain.Rates pain # 4.Advised he needs to go to ED.Trish notified.I will make Dr.Jordan aware.

## 2024-04-24 NOTE — Telephone Encounter (Signed)
 Spoke with the patient who states that he has still been experiencing chest pain and shortness of breath. He has been taking 3-5 nitroglycerin  per day. He recently had a heart cath with unsuccessful attempt at PCI of RCA. Medical therapy was recommended unless CAD progresses. Patient states that the only new symptom that he has noticed this week is some increased diaphoresis.

## 2024-04-24 NOTE — ED Provider Notes (Signed)
 [place] EMERGENCY DEPARTMENT AT [facility] Provider Note   CSN: [id number] Arrival date & time: 04/24/24  1411     Patient presents with: Chest Pain and Shortness of Breath   Jeffrey Hooper is a 63 y.o. male.    Chest Pain Associated symptoms: shortness of breath   Shortness of Breath Associated symptoms: chest pain   Patient with increasing shortness of breath and diaphoresis.  For the last few days has been more short of breath.  History of chronic angina.  Has chronic occlusion of the RCA.  Has had previous stent of the LAD.  On medical management it is increased Imdur  and now Ranexa .  Has been able to do less physical activity.  States he got the angina just using the vacuum cleaner today.     Prior to Admission medications   Medication Sig Start Date End Date Taking? Authorizing Provider  acetaminophen  (TYLENOL ) 500 MG tablet Take 1,000 mg by mouth every 8 (eight) hours as needed for moderate pain (pain score 4-6).    [provider]  albuterol  (VENTOLIN  HFA) 108 (90 Base) MCG/ACT inhaler TAKE 2 PUFFS BY MOUTH EVERY 6 HOURS AS NEEDED FOR WHEEZE OR SHORTNESS OF BREATH 01/30/23   Jolinda Potter M, DO  AMBULATORY NON FORMULARY MEDICATION Diltiazem 2%/lidocaine  5% apply pea sized amount three times daily for 2 months 01/21/24   Heinz, Camie BRAVO, PA-C  amLODipine  (NORVASC ) 5 MG tablet Take 0.5 tablets (2.5 mg total) by mouth 2 (two) times daily. Take 1/2 tablet daily 04/24/24   Patsey Lot, MD  aspirin  EC 81 MG tablet Take 81 mg by mouth at bedtime.    [provider]  atorvastatin  (LIPITOR ) 80 MG tablet TAKE 1 TABLET BY MOUTH EVERYDAY AT BEDTIME 08/30/23   Jolinda Potter M, DO  betamethasone  dipropionate 0.05 % cream Apply 1 Application topically 2 (two) times daily as needed (swelling/ itching).    [provider]  cetirizine  (ZYRTEC ) 10 MG tablet Take 10 mg by mouth daily.    [provider]  Cyanocobalamin  (B-12 COMPLIANCE  INJECTION) 1000 MCG/ML KIT Inject 1,000 mcg as directed every 30 (thirty) days.    [provider]  EPINEPHrine  0.3 mg/0.3 mL IJ SOAJ injection Inject 0.3 mg into the muscle as needed for anaphylaxis. 02/05/24   Lavona Agent, MD  esomeprazole  (NEXIUM ) 40 MG capsule Take 1 capsule (40 mg total) by mouth 2 (two) times daily before a meal. 01/21/24   Heinz, Camie BRAVO, PA-C  ezetimibe  (ZETIA ) 10 MG tablet Take 1 tablet (10 mg total) by mouth daily. 04/09/24 07/08/24  [place], Peter M, MD  fluticasone  (FLONASE ) 50 MCG/ACT nasal spray Place 2 sprays into both nostrils daily. 06/14/20   Jolinda Potter [name], DO  furosemide  (LASIX ) 20 MG tablet Take 1 tablet (20 mg total) by mouth daily. 03/13/24   Lavona Agent, MD  HYDROcodone -acetaminophen  (NORCO/VICODIN) 5-325 MG tablet Take 1 tablet by mouth every 6 (six) hours as needed for severe pain (pain score 7-10) or moderate pain (pain score 4-6). 02/29/24   Ula Prentice SAUNDERS, MD  isosorbide  mononitrate (IMDUR ) 120 MG 24 hr tablet Take 1 tablet (120 mg total) by mouth daily. 03/13/24   Lavona Agent, MD  levothyroxine  (SYNTHROID ) 100 MCG tablet Take 1 tablet (100 mcg total) by mouth daily. 08/30/23   Jolinda Potter [name], DO  nitroGLYCERIN  (NITROSTAT ) 0.4 MG SL tablet Place 1 tablet (0.4 mg total) under the tongue every 5 (five) minutes as needed for chest pain. 02/05/24  Lavona Agent, MD  olmesartan  (BENICAR ) 40 MG tablet Take 1 tablet (40 mg total) by mouth daily. 12/09/23   Rana Lum CROME, NP  ondansetron  (ZOFRAN -ODT) 4 MG disintegrating tablet TAKE 1 TABLET BY MOUTH EVERY 8 HOURS AS NEEDED FOR NAUSEA AND VOMITING 08/30/23   Jolinda Potter M, DO  PARoxetine  (PAXIL ) 20 MG tablet Take 1 tablet (20 mg total) by mouth daily. 08/30/23   Jolinda Potter M, DO  promethazine  (PHENERGAN ) 25 MG tablet TAKE 0.5-1 TABLETS (12.5-25 MG TOTAL) BY MOUTH 2 (TWO) TIMES DAILY AS NEEDED FOR NAUSEA OR VOMITING. 11/15/21   Jolinda Potter M, DO  ranolazine  (RANEXA ) 1000 MG SR  tablet Take 1 tablet (1,000 mg total) by mouth 2 (two) times daily. 04/09/24   [place], Peter M, MD    Allergies: Alpha-gal, Beef (bovine) protein, Beef-derived drug products, Lambs quarters, Pork-derived products, Prednisone , Brilinta  [ticagrelor ], Doxycycline, Gabapentin , Lyrica [pregabalin], Methylprednisolone  sodium succ, Penicillins, Shellfish allergy , Adhesive [tape], and Fentanyl     Review of Systems  Respiratory:  Positive for shortness of breath.   Cardiovascular:  Positive for chest pain.    Updated Vital Signs BP 132/70   Pulse (!) 51   Temp 98.2 F (36.8 C) (Oral)   Resp 11   Ht 5' 10 (1.778 m)   Wt 93 kg   SpO2 97%   BMI 29.41 kg/m   Physical Exam Vitals and nursing note reviewed.  Cardiovascular:     Rate and Rhythm: Normal rate.  Pulmonary:     Breath sounds: No wheezing.  Chest:     Chest wall: No tenderness.  Abdominal:     Tenderness: There is no abdominal tenderness.  Musculoskeletal:     Right lower leg: No tenderness.     Left lower leg: No tenderness.  Neurological:     Mental Status: He is alert.     (all labs ordered are listed, but only abnormal results are displayed) Labs Reviewed  BASIC METABOLIC PANEL WITH GFR - Abnormal; Notable for the following components:      Result Value   Glucose, Bld 101 (*)    BUN 25 (*)    Creatinine, Ser 1.27 (*)    All other components within normal limits  CBC  TROPONIN I (HIGH SENSITIVITY)  TROPONIN I (HIGH SENSITIVITY)    EKG: EKG Interpretation Date/Time:  Friday April 24 2024 14:20:20 EDT Ventricular Rate:  67 PR Interval:  164 QRS Duration:  94 QT Interval:  434 QTC Calculation: 458 R Axis:   39  Text Interpretation: Normal sinus rhythm Normal ECG When compared with ECG of 20-Mar-2024 04:46, No significant change since last tracing Confirmed by Patsey Lot [phone]) on 04/24/2024 3:33:10 PM  Radiology: [facility] Chest 2 View Result Date: 04/24/2024 CLINICAL DATA:  Chest pain. EXAM: CHEST -  2 VIEW COMPARISON:  March 09, 2024. FINDINGS: The heart size and mediastinal contours are within normal limits. Both lungs are clear. The visualized skeletal structures are unremarkable. IMPRESSION: No active cardiopulmonary disease. Electronically Signed   By: Agent Landy Raddle M.D.   On: 04/24/2024 15:10     Procedures   Medications Ordered in the ED - No data to display                                  Medical Decision Making Amount and/or Complexity of Data Reviewed Labs: ordered. Radiology: ordered.  Risk Prescription drug management.   Patient with chest  pain.  History of chronic angina now having episodes of diaphoresis and increasing shortness of breath.  Reviewed previous cardiology note.  Chronic angina that sounds improved and worsening.  Had recent cardiac cath.  Also had attempted opening up to chronic total occlusion of the RCA but was unsuccessful.  Sounds of may have maxed out medical therapy.  Initial EKG reassuring.  Will discuss with cardiology and check troponin  Troponins reassuring.  X-ray reassuring.  Per cardiology discussion patient will increase his amlodipine  to twice a day.  They will be setting up outpatient follow-up with cardiothoracic surgery.  Appears stable for discharge home at this time however.     Final diagnoses:  Nonspecific chest pain    ED Discharge Orders          Ordered    amLODipine  (NORVASC ) 5 MG tablet  2 times daily        04/24/24 1900               Patsey Lot, MD 04/24/24 2316

## 2024-04-24 NOTE — Discharge Instructions (Signed)
 Follow-up with cardiology.  Cardiology is also arranging follow-up with cardiothoracic surgery.  Increase your Norvasc  to 2.5 mg (half a tablet) twice a day.

## 2024-04-27 NOTE — Telephone Encounter (Signed)
 Spoke to patient on 9/19.He stated she felt awful.He is having frequent chest pain,taking 4 to 5 NTG's per day.He is having chest pain at present rates pain # 4.Advised he needs to go to ED to be evaluated.

## 2024-05-01 ENCOUNTER — Ambulatory Visit
Attending: Thoracic Surgery (Cardiothoracic Vascular Surgery) | Admitting: Thoracic Surgery (Cardiothoracic Vascular Surgery)

## 2024-05-01 ENCOUNTER — Encounter: Payer: Self-pay | Admitting: Thoracic Surgery (Cardiothoracic Vascular Surgery)

## 2024-05-01 ENCOUNTER — Encounter: Payer: Self-pay | Admitting: *Deleted

## 2024-05-01 ENCOUNTER — Other Ambulatory Visit: Payer: Self-pay | Admitting: *Deleted

## 2024-05-01 VITALS — BP 128/63 | HR 54 | Resp 20 | Ht 70.0 in | Wt 211.6 lb

## 2024-05-01 DIAGNOSIS — I2510 Atherosclerotic heart disease of native coronary artery without angina pectoris: Secondary | ICD-10-CM | POA: Diagnosis not present

## 2024-05-01 DIAGNOSIS — Z5181 Encounter for therapeutic drug level monitoring: Secondary | ICD-10-CM

## 2024-05-01 DIAGNOSIS — R7989 Other specified abnormal findings of blood chemistry: Secondary | ICD-10-CM

## 2024-05-01 DIAGNOSIS — R9439 Abnormal result of other cardiovascular function study: Secondary | ICD-10-CM

## 2024-05-01 NOTE — H&P (View-Only) (Signed)
 [address]       [place] [address]             [phone]        Jeffrey Hooper [facility] Medical Record #[id number] Date of Birth: 1960/08/17  Referring: Gwenetta Gates* Primary Care: [name], DO Primary Cardiologist:James Lavona, MD  Chief Complaint:    Chief Complaint  Patient presents with   Coronary Artery Disease    New patient consultation, CATH 8/14, ECHO 5/20    History of Present Illness:     Jeffrey Hooper is a 63 y.o. male who presents for surgical evaluation of coronary artery disease.  He has a history of a CTO lesion to his RCA and has undergone attempted PCI.  This was unsuccessful.  He continues to have exertional shortness of breath and chest tightness.  He also has decreased energy.   Past Medical and Surgical History: Previous Chest Surgery: No Previous Chest Radiation: No Diabetes Mellitus: Yes.  HbA1C 5.7 Creatinine:  Lab Results  Component Value Date   CREATININE 1.27 (H) 04/24/2024   CREATININE 1.11 03/20/2024   CREATININE 1.27 03/16/2024     Past Medical History:  Diagnosis Date   Allergy     Allergy  to alpha-gal    Anxiety    Arthritis    CAD (coronary artery disease), native coronary artery 12/03/2015   Coronary artery disease    Dysrhythmia    irregular and PAC'S   GERD (gastroesophageal reflux disease)    Head injury with loss of consciousness (HCC)    back in the 1980's   History of kidney stones    Hyperlipidemia    Hypertension    Hypothyroid    Multiple benign nevi    Neuromuscular disorder (HCC) 2011   LEFT RADIAL NERVE SURGERY R/T TRAUMA   Pneumonia 2014, 2017   Prostatism    Pulmonary nodule    Spinal cord stimulator status    told to bring remote to surgery    Past Surgical History:  Procedure Laterality Date   BACK SURGERY     T12 - L1, 11 times including neck   CARDIAC CATHETERIZATION N/A 12/02/2015   Procedure: Left Heart Cath and Coronary Angiography;   Surgeon: Peter M Swaziland, MD;  Location: [facility] INVASIVE CV LAB;  Service: Cardiovascular;  Laterality: N/A;   CARDIAC CATHETERIZATION N/A 12/02/2015   Procedure: Coronary Stent Intervention;  Surgeon: Peter M Swaziland, MD;  Location: [facility] INVASIVE CV LAB;  Service: Cardiovascular;  Laterality: N/A;  mid LAD Promus 2.5x12   CERVICAL FUSION  2023   C4 and C5   COLONOSCOPY     CORONARY ANGIOPLASTY     one stent placed by Dr. P Swaziland   CORONARY CTO INTERVENTION N/A 03/19/2024   Procedure: CORONARY CTO INTERVENTION;  Surgeon: Swaziland, Peter M, MD;  Location: [facility] INVASIVE CV LAB;  Service: Cardiovascular;  Laterality: N/A;   HARDWARE REMOVAL Right 08/26/2017   Procedure: Right Lumbar Five Revision of pedicle screw with Removal of Lumbar Five Screw;  Surgeon: Colon Shove, MD;  Location: [facility] OR;  Service: Neurosurgery;  Laterality: Right;  posterior   HARDWARE REMOVAL Right 12/27/2017   Procedure: Right Lumbar Two, Lumbar Three, Lumbar Four Pedicle screw removal with metrex;  Surgeon: Colon Shove, MD;  Location: [facility] OR;  Service: Neurosurgery;  Laterality: Right;  Right L2 to L4 Pedicle screw removal with mterex   HEMORRHOID BANDING     HERNIA REPAIR  LAPAROSCOPY N/A 09/01/2020   Procedure: LAPAROSCOPY DIAGNOSTIC;  Surgeon: Debby Hila, MD;  Location: [facility] ORS;  Service: General;  Laterality: N/A;   LEFT HEART CATH AND CORONARY ANGIOGRAPHY N/A 02/26/2024   Procedure: LEFT HEART CATH AND CORONARY ANGIOGRAPHY;  Surgeon: Swaziland, Peter M, MD;  Location: [facility] INVASIVE CV LAB;  Service: Cardiovascular;  Laterality: N/A;   MASS EXCISION  06/25/2012   Procedure: EXCISION MASS;  Surgeon: Arley JONELLE Curia, MD;  Location: [facility];  Service: Orthopedics;  Laterality: Left;  transection of NEUROMA, BURYING RADIAL NERVE IN BRACHIORADIALIS LEFT SIDE   radial nerve Left    cut in work injury   RIGHT GREAT TOENAIL REMOVAL  04/2020   SPINAL FUSION     C6-7   TONSILLECTOMY  1982   UMBILICAL HERNIA REPAIR N/A  09/01/2020   Procedure: PRIMARY UMBILICAL HERNIA REPAIR;  Surgeon: Debby Hila, MD;  Location: [facility] ORS;  Service: General;  Laterality: N/A;   VASECTOMY      Social History:  Social History   Tobacco Use  Smoking Status Former   Current packs/day: 0.00   Average packs/day: 1 pack/day for 20.0 years (20.0 ttl pk-yrs)   Types: Cigarettes   Start date: 11/28/1986   Quit date: 11/28/2006   Years since quitting: 17.4  Smokeless Tobacco Never    Social History   Substance and Sexual Activity  Alcohol Use Not Currently   Comment: rarely     Allergies  Allergen Reactions   Alpha-Gal Rash and Other (See Comments)    SEVERE ALLERGY  TO ANY MEAT OR MEAT DERIVED PRODUCTS FROM 4 LEGGED ANIMALS > > BEEF, PORK , GOATS, DEER, ETC. < < RESULT OF BITE FROM LONE STAR TICK   Beef (Bovine) Protein Rash and Other (See Comments)   Beef-Derived Drug Products Rash and Other (See Comments)   Lambs Quarters Rash and Other (See Comments)   Pork-Derived Products Rash and Other (See Comments)   Prednisone  Other (See Comments)    im depo medrol  caused dizziness - pt passed out. Ask pt before giving. Pt can take oral prednisone     Brilinta  [Ticagrelor ] Hives    Pt ate pork on the same day he took Brilinta  before knowing he had alpha gal. May have been the alpha gal reaction, pt is unsure.    Doxycycline Hives    Other Reaction(s): rash/swelling   Gabapentin  Swelling   Lyrica [Pregabalin]     Dry mouth and felt raw   Methylprednisolone  Sodium Succ     im depo medrol  caused dizziness - pt passed out. Ask pt before giving. Issue occurred when administered in the left arm    Penicillins Hives   Shellfish Allergy  Hives   Adhesive [Tape] Rash   Fentanyl  Rash    Reaction to adhesive, not the drug     Medications:   Current Outpatient Medications  Medication Sig Dispense Refill   acetaminophen  (TYLENOL ) 500 MG tablet Take 1,000 mg by mouth every 8 (eight) hours as needed for moderate pain (pain  score 4-6).     albuterol  (VENTOLIN  HFA) 108 (90 Base) MCG/ACT inhaler TAKE 2 PUFFS BY MOUTH EVERY 6 HOURS AS NEEDED FOR WHEEZE OR SHORTNESS OF BREATH 18 each 1   AMBULATORY NON FORMULARY MEDICATION Diltiazem 2%/lidocaine  5% apply pea sized amount three times daily for 2 months 30 g 0   amLODipine  (NORVASC ) 5 MG tablet Take 0.5 tablets (2.5 mg total) by mouth 2 (two) times daily. Take 1/2 tablet daily 30 tablet 0  aspirin  EC 81 MG tablet Take 81 mg by mouth at bedtime.     atorvastatin  (LIPITOR ) 80 MG tablet TAKE 1 TABLET BY MOUTH EVERYDAY AT BEDTIME 90 tablet 3   betamethasone  dipropionate 0.05 % cream Apply 1 Application topically 2 (two) times daily as needed (swelling/ itching).     cetirizine  (ZYRTEC ) 10 MG tablet Take 10 mg by mouth daily.     Cyanocobalamin  (B-12 COMPLIANCE INJECTION) 1000 MCG/ML KIT Inject 1,000 mcg as directed every 30 (thirty) days.     EPINEPHrine  0.3 mg/0.3 mL IJ SOAJ injection Inject 0.3 mg into the muscle as needed for anaphylaxis. 2 each 0   esomeprazole  (NEXIUM ) 40 MG capsule Take 1 capsule (40 mg total) by mouth 2 (two) times daily before a meal. 60 capsule 2   ezetimibe  (ZETIA ) 10 MG tablet Take 1 tablet (10 mg total) by mouth daily. 90 tablet 3   fluticasone  (FLONASE ) 50 MCG/ACT nasal spray Place 2 sprays into both nostrils daily. 48 g 1   furosemide  (LASIX ) 20 MG tablet Take 1 tablet (20 mg total) by mouth daily. 90 tablet 3   HYDROcodone -acetaminophen  (NORCO/VICODIN) 5-325 MG tablet Take 1 tablet by mouth every 6 (six) hours as needed for severe pain (pain score 7-10) or moderate pain (pain score 4-6). 10 tablet 0   isosorbide  mononitrate (IMDUR ) 120 MG 24 hr tablet Take 1 tablet (120 mg total) by mouth daily. 90 tablet 3   levothyroxine  (SYNTHROID ) 100 MCG tablet Take 1 tablet (100 mcg total) by mouth daily. 90 tablet 3   nitroGLYCERIN  (NITROSTAT ) 0.4 MG SL tablet Place 1 tablet (0.4 mg total) under the tongue every 5 (five) minutes as needed for chest pain.  25 tablet 3   olmesartan  (BENICAR ) 40 MG tablet Take 1 tablet (40 mg total) by mouth daily. 90 tablet 2   ondansetron  (ZOFRAN -ODT) 4 MG disintegrating tablet TAKE 1 TABLET BY MOUTH EVERY 8 HOURS AS NEEDED FOR NAUSEA AND VOMITING 20 tablet 0   PARoxetine  (PAXIL ) 20 MG tablet Take 1 tablet (20 mg total) by mouth daily. 90 tablet 3   promethazine  (PHENERGAN ) 25 MG tablet TAKE 0.5-1 TABLETS (12.5-25 MG TOTAL) BY MOUTH 2 (TWO) TIMES DAILY AS NEEDED FOR NAUSEA OR VOMITING. 45 tablet 1   ranolazine  (RANEXA ) 1000 MG SR tablet Take 1 tablet (1,000 mg total) by mouth 2 (two) times daily. 180 tablet 3   Current Facility-Administered Medications  Medication Dose Route Frequency Provider Last Rate Last Admin   cyanocobalamin  (VITAMIN B12) injection 1,000 mcg  1,000 mcg Intramuscular Q30 days Jolinda Potter M, DO   1,000 mcg at 04/07/24 0830    (Not in a hospital admission)   Family History  Problem Relation Age of Onset   Heart failure Mother 21       Died age 18   Congestive Heart Failure Mother    Heart attack Father 49       Died with MI   Heart attack Paternal Grandmother    Heart attack Paternal Grandfather    Colon cancer Neg Hx    Esophageal cancer Neg Hx    Pancreatic cancer Neg Hx    Stomach cancer Neg Hx      Review of Systems:   Review of Systems  Constitutional:  Positive for malaise/fatigue.  Respiratory:  Positive for shortness of breath.   Cardiovascular:  Positive for chest pain.  Neurological:  Positive for dizziness.      Physical Exam: BP 128/63 (BP Location: Left Arm, Patient  Position: Sitting, Cuff Size: Large)   Pulse (!) 54   Resp 20   Ht 5' 10 (1.778 m)   Wt 211 lb 9.6 oz (96 kg)   SpO2 96% Comment: RA  BMI 30.36 kg/m  Physical Exam Constitutional:      General: He is not in acute distress.    Appearance: He is not ill-appearing.  HENT:     Head: Normocephalic and atraumatic.  Eyes:     Extraocular Movements: Extraocular movements intact.   Cardiovascular:     Rate and Rhythm: Bradycardia present.  Pulmonary:     Effort: Pulmonary effort is normal. No respiratory distress.  Abdominal:     General: Abdomen is flat. There is no distension.  Musculoskeletal:        General: Normal range of motion.     Cervical back: Normal range of motion.  Skin:    General: Skin is warm and dry.         Comments: Incision on thumb and radial side of forearm  Neurological:     General: No focal deficit present.     Mental Status: He is alert and oriented to person, place, and time.       Diagnostic Studies & Laboratory data: Cardiac Studies & Procedures   ______________________________________________________________________________________________ CARDIAC CATHETERIZATION  CARDIAC CATHETERIZATION 03/19/2024  Conclusion   Mid LAD lesion is 30% stenosed.   Ost Cx to Prox Cx lesion is 50% stenosed.   Prox RCA to Mid RCA lesion is 100% stenosed.   Post intervention, there is a 100% residual stenosis.  Unsuccessful CTO PCI of the RCA. Unable to cross lesion with wire due to severe calcification   Plan: continue medical therapy. Will observe overnight.  Findings Coronary Findings Diagnostic  Dominance: Right  Left Anterior Descending Mid LAD lesion is 30% stenosed. The lesion was previously treated using a drug eluting stent over 2 years ago.  First Diagonal Branch Vessel is small in size.  Left Circumflex Ost Cx to Prox Cx lesion is 50% stenosed.  Right Coronary Artery Prox RCA to Mid RCA lesion is 100% stenosed. The lesion is chronically occluded. The lesion is severely calcified.  Right Posterior Descending Artery Collaterals RPDA filled by collaterals from 2nd Diag.  Intervention  Prox RCA to Mid RCA lesion Angioplasty CATH MACH1 8FR AL.75 90CM guide catheter was inserted. WIRE ASAHI CONFIANZA 300CM guidewire used to cross lesion. Post-Intervention Lesion Assessment The intervention was unsuccessful due to  inability to cross the lesion with wire. Pre-interventional TIMI flow is 0. Post-intervention TIMI flow is 0. No complications occurred at this lesion. There is a 100% residual stenosis post intervention.   CARDIAC CATHETERIZATION  CARDIAC CATHETERIZATION 02/26/2024  Conclusion   Ost Cx to Prox Cx lesion is 50% stenosed.   Mid LAD lesion is 30% stenosed.   Prox RCA to Mid RCA lesion is 100% stenosed.   The left ventricular systolic function is normal.   LV end diastolic pressure is normal.   The left ventricular ejection fraction is 55-65% by visual estimate.  Single vessel occlusive CAD. Continued patency of stent in the LAD. The RCA is occluded in the mid vessel with excellent left to right collaterals from the second diagonal Normal LV function Normal LVEDP  Plan: patient has not been on any anti-anginal therapy. Will add Toprol  XL 25 mg daily and Imdur  30 mg daily. Stop HCT to allow titration of medication. If he has refractory angina despite optimal medical therapy could consider CTO PCI.  Findings Coronary Findings Diagnostic  Dominance: Right  Left Anterior Descending Mid LAD lesion is 30% stenosed. The lesion was previously treated using a drug eluting stent over 2 years ago.  First Diagonal Branch Vessel is small in size.  Left Circumflex Ost Cx to Prox Cx lesion is 50% stenosed.  Right Coronary Artery Prox RCA to Mid RCA lesion is 100% stenosed. The lesion is chronically occluded. The lesion is severely calcified.  Right Posterior Descending Artery Collaterals RPDA filled by collaterals from 2nd Diag.  Intervention  No interventions have been documented.   STRESS TESTS  NM MYOCAR MULTI W/SPECT W 02/17/2024  Narrative   Inferior defect difficult to interpret in setting of adjacent gut radiotracer uptake. Findings suggest probable inferior infarct with mild to moderate peri-infarct ischemia. Overall low to intermediate risk findings.   No ST deviation was  noted.   LV perfusion is abnormal. There is a moderate size moderate intensity inferior defect with mild to moderate reversibility. Interpretation is affected by significant adjacent gut radiotracer uptake.   Left ventricular function is normal. End diastolic cavity size is normal.   ECHOCARDIOGRAM  ECHOCARDIOGRAM COMPLETE 12/24/2023  Narrative ECHOCARDIOGRAM REPORT    Patient Name:   Jeffrey Hooper Date of Exam: 12/24/2023 Medical Rec #:  [id number]       Height:       70.0 in Accession #:    [id number]      Weight:       211.2 lb Date of Birth:  [birth year]       BSA:          2.136 m Patient Age:    62 years        BP:           135/68 mmHg Patient Gender: M               HR:           49 bpm. Exam Location:  Inpatient  Procedure: 2D Echo, Color Doppler and Cardiac Doppler (Both Spectral and Color Flow Doppler were utilized during procedure).  Indications:    R94.31 Abnormal EKG  History:        Patient has prior history of Echocardiogram examinations, most recent 11/25/2015. CAD; Risk Factors:Hypertension and Dyslipidemia.  Sonographer:    Damien Senior RDCS Referring Phys: [id number] EARLENE CHRISTELLA CLUSTER  IMPRESSIONS   1. Left ventricular ejection fraction, by estimation, is 60 to 65%. The left ventricle has normal function. The left ventricle has no regional wall motion abnormalities. There is mild concentric left ventricular hypertrophy. Left ventricular diastolic parameters were normal. 2. Right ventricular systolic function is normal. The right ventricular size is mildly enlarged. Tricuspid regurgitation signal is inadequate for assessing PA pressure. 3. Left atrial size was mildly dilated. 4. The mitral valve is normal in structure. Trivial mitral valve regurgitation. 5. The aortic valve is tricuspid. Aortic valve regurgitation is not visualized. 6. There is borderline dilatation of the ascending aorta, measuring 39 mm. 7. The inferior vena cava is normal in size with  greater than 50% respiratory variability, suggesting right atrial pressure of 3 mmHg.  Comparison(s): Prior images unable to be directly viewed, comparison made by report only.  FINDINGS Left Ventricle: Left ventricular ejection fraction, by estimation, is 60 to 65%. The left ventricle has normal function. The left ventricle has no regional wall motion abnormalities. The left ventricular internal cavity size was normal in size. There is mild concentric left ventricular hypertrophy. Left ventricular diastolic parameters  were normal.  Right Ventricle: The right ventricular size is mildly enlarged. No increase in right ventricular wall thickness. Right ventricular systolic function is normal. Tricuspid regurgitation signal is inadequate for assessing PA pressure.  Left Atrium: Left atrial size was mildly dilated.  Right Atrium: Right atrial size was normal in size.  Pericardium: Trivial pericardial effusion is present.  Mitral Valve: The mitral valve is normal in structure. Trivial mitral valve regurgitation.  Tricuspid Valve: The tricuspid valve is normal in structure. Tricuspid valve regurgitation is trivial.  Aortic Valve: The aortic valve is tricuspid. Aortic valve regurgitation is not visualized.  Pulmonic Valve: The pulmonic valve was normal in structure. Pulmonic valve regurgitation is trivial.  Aorta: The aortic root is normal in size and structure. Ascending aorta measurements are within normal limits for age when indexed to body surface area. There is borderline dilatation of the ascending aorta, measuring 39 mm.  Venous: The inferior vena cava is normal in size with greater than 50% respiratory variability, suggesting right atrial pressure of 3 mmHg.  IAS/Shunts: The atrial septum is grossly normal.   LEFT VENTRICLE PLAX 2D LVIDd:         4.50 cm   Diastology LVIDs:         3.20 cm   LV e' medial:    8.49 cm/s LV PW:         1.10 cm   LV E/e' medial:  9.3 LV IVS:         1.20 cm   LV e' lateral:   10.90 cm/s LVOT diam:     2.30 cm   LV E/e' lateral: 7.2 LV SV:         125 LV SV Index:   58 LVOT Area:     4.15 cm   RIGHT VENTRICLE RV S prime:     14.10 cm/s TAPSE (M-mode): 3.1 cm  LEFT ATRIUM             Index        RIGHT ATRIUM           Index LA diam:        4.00 cm 1.87 cm/m   RA Area:     19.00 cm LA Vol (A2C):   68.6 ml 32.11 ml/m  RA Volume:   55.20 ml  25.84 ml/m LA Vol (A4C):   65.8 ml 30.80 ml/m LA Biplane Vol: 69.3 ml 32.44 ml/m AORTIC VALVE LVOT Vmax:   130.00 cm/s LVOT Vmean:  92.400 cm/s LVOT VTI:    0.300 m  AORTA Ao Root diam: 3.40 cm Ao Asc diam:  3.90 cm  MITRAL VALVE MV Area (PHT): 3.07 cm    SHUNTS MV Decel Time: 247 msec    Systemic VTI:  0.30 m MV E velocity: 78.60 cm/s  Systemic Diam: 2.30 cm MV A velocity: 58.20 cm/s MV E/A ratio:  1.35  Mihai Croitoru MD Electronically signed by Jerel Balding MD Signature Date/Time: 12/24/2023/3:45:21 PM    Final    MONITORS  LONG TERM MONITOR-LIVE TELEMETRY (3-14 DAYS) 01/20/2024  Narrative Predominant rhythm normal sinus rhythm Occasional supraventricular tachycardia brief runs with the longest being 16 beats. Possible brief runs of atrial tachycardia. No sustained arrhythmias.       ______________________________________________________________________________________________     EKG: Sinus I have independently reviewed the above radiologic studies and discussed with the patient   Recent Lab Findings: Lab Results  Component Value Date   WBC 6.6 04/24/2024   HGB 13.4 04/24/2024  HCT 39.7 04/24/2024   PLT 216 04/24/2024   GLUCOSE 101 (H) 04/24/2024   CHOL 142 12/23/2023   TRIG 133 12/23/2023   HDL 46 12/23/2023   LDLCALC 69 12/23/2023   ALT 32 01/21/2024   AST 19 01/21/2024   NA 136 04/24/2024   K 3.7 04/24/2024   CL 103 04/24/2024   CREATININE 1.27 (H) 04/24/2024   BUN 25 (H) 04/24/2024   CO2 24 04/24/2024   TSH 2.800 12/23/2023   INR  1.0 01/21/2024   HGBA1C 5.7 (H) 08/30/2023      Assessment / Plan:   63 y.o. male with isolated RCA disease.  He has previous PCI to the LAD but the stent is patent.  Echocardiogram reveals preserved biventricular function and no significant valvular disease.  We discussed the risks and benefits of an off-pump CABG x 1.  I think the use of the radial artery along with a small piece of vein would be ideal.  He has had surgery to his left hand in the past and has residual neuropathy.  There is an incision along his thumb and the dorsal portion of his forearm.  I think that a radial harvest should still be possible, but if there is too much scar tissue that we will just take 1 segment of vein.     I  spent 40 minutes counseling the patient face to face.   Linnie MALVA Rayas 05/01/2024 12:55 PM

## 2024-05-01 NOTE — Progress Notes (Signed)
 [address]       [place] [address]             [phone]        Jeffrey Hooper [facility] Medical Record #[id number] Date of Birth: 1960/08/17  Referring: Gwenetta Gates* Primary Care: [name], DO Primary Cardiologist:James Lavona, MD  Chief Complaint:    Chief Complaint  Patient presents with   Coronary Artery Disease    New patient consultation, CATH 8/14, ECHO 5/20    History of Present Illness:     Jeffrey Hooper is a 63 y.o. male who presents for surgical evaluation of coronary artery disease.  He has a history of a CTO lesion to his RCA and has undergone attempted PCI.  This was unsuccessful.  He continues to have exertional shortness of breath and chest tightness.  He also has decreased energy.   Past Medical and Surgical History: Previous Chest Surgery: No Previous Chest Radiation: No Diabetes Mellitus: Yes.  HbA1C 5.7 Creatinine:  Lab Results  Component Value Date   CREATININE 1.27 (H) 04/24/2024   CREATININE 1.11 03/20/2024   CREATININE 1.27 03/16/2024     Past Medical History:  Diagnosis Date   Allergy     Allergy  to alpha-gal    Anxiety    Arthritis    CAD (coronary artery disease), native coronary artery 12/03/2015   Coronary artery disease    Dysrhythmia    irregular and PAC'S   GERD (gastroesophageal reflux disease)    Head injury with loss of consciousness (HCC)    back in the 1980's   History of kidney stones    Hyperlipidemia    Hypertension    Hypothyroid    Multiple benign nevi    Neuromuscular disorder (HCC) 2011   LEFT RADIAL NERVE SURGERY R/T TRAUMA   Pneumonia 2014, 2017   Prostatism    Pulmonary nodule    Spinal cord stimulator status    told to bring remote to surgery    Past Surgical History:  Procedure Laterality Date   BACK SURGERY     T12 - L1, 11 times including neck   CARDIAC CATHETERIZATION N/A 12/02/2015   Procedure: Left Heart Cath and Coronary Angiography;   Surgeon: Peter M Swaziland, MD;  Location: [facility] INVASIVE CV LAB;  Service: Cardiovascular;  Laterality: N/A;   CARDIAC CATHETERIZATION N/A 12/02/2015   Procedure: Coronary Stent Intervention;  Surgeon: Peter M Swaziland, MD;  Location: [facility] INVASIVE CV LAB;  Service: Cardiovascular;  Laterality: N/A;  mid LAD Promus 2.5x12   CERVICAL FUSION  2023   C4 and C5   COLONOSCOPY     CORONARY ANGIOPLASTY     one stent placed by Dr. P Swaziland   CORONARY CTO INTERVENTION N/A 03/19/2024   Procedure: CORONARY CTO INTERVENTION;  Surgeon: Swaziland, Peter M, MD;  Location: [facility] INVASIVE CV LAB;  Service: Cardiovascular;  Laterality: N/A;   HARDWARE REMOVAL Right 08/26/2017   Procedure: Right Lumbar Five Revision of pedicle screw with Removal of Lumbar Five Screw;  Surgeon: Colon Shove, MD;  Location: [facility] OR;  Service: Neurosurgery;  Laterality: Right;  posterior   HARDWARE REMOVAL Right 12/27/2017   Procedure: Right Lumbar Two, Lumbar Three, Lumbar Four Pedicle screw removal with metrex;  Surgeon: Colon Shove, MD;  Location: [facility] OR;  Service: Neurosurgery;  Laterality: Right;  Right L2 to L4 Pedicle screw removal with mterex   HEMORRHOID BANDING     HERNIA REPAIR  LAPAROSCOPY N/A 09/01/2020   Procedure: LAPAROSCOPY DIAGNOSTIC;  Surgeon: Debby Hila, MD;  Location: [facility] ORS;  Service: General;  Laterality: N/A;   LEFT HEART CATH AND CORONARY ANGIOGRAPHY N/A 02/26/2024   Procedure: LEFT HEART CATH AND CORONARY ANGIOGRAPHY;  Surgeon: Swaziland, Peter M, MD;  Location: [facility] INVASIVE CV LAB;  Service: Cardiovascular;  Laterality: N/A;   MASS EXCISION  06/25/2012   Procedure: EXCISION MASS;  Surgeon: Arley JONELLE Curia, MD;  Location: [facility];  Service: Orthopedics;  Laterality: Left;  transection of NEUROMA, BURYING RADIAL NERVE IN BRACHIORADIALIS LEFT SIDE   radial nerve Left    cut in work injury   RIGHT GREAT TOENAIL REMOVAL  04/2020   SPINAL FUSION     C6-7   TONSILLECTOMY  1982   UMBILICAL HERNIA REPAIR N/A  09/01/2020   Procedure: PRIMARY UMBILICAL HERNIA REPAIR;  Surgeon: Debby Hila, MD;  Location: [facility] ORS;  Service: General;  Laterality: N/A;   VASECTOMY      Social History:  Social History   Tobacco Use  Smoking Status Former   Current packs/day: 0.00   Average packs/day: 1 pack/day for 20.0 years (20.0 ttl pk-yrs)   Types: Cigarettes   Start date: 11/28/1986   Quit date: 11/28/2006   Years since quitting: 17.4  Smokeless Tobacco Never    Social History   Substance and Sexual Activity  Alcohol Use Not Currently   Comment: rarely     Allergies  Allergen Reactions   Alpha-Gal Rash and Other (See Comments)    SEVERE ALLERGY  TO ANY MEAT OR MEAT DERIVED PRODUCTS FROM 4 LEGGED ANIMALS > > BEEF, PORK , GOATS, DEER, ETC. < < RESULT OF BITE FROM LONE STAR TICK   Beef (Bovine) Protein Rash and Other (See Comments)   Beef-Derived Drug Products Rash and Other (See Comments)   Lambs Quarters Rash and Other (See Comments)   Pork-Derived Products Rash and Other (See Comments)   Prednisone  Other (See Comments)    im depo medrol  caused dizziness - pt passed out. Ask pt before giving. Pt can take oral prednisone     Brilinta  [Ticagrelor ] Hives    Pt ate pork on the same day he took Brilinta  before knowing he had alpha gal. May have been the alpha gal reaction, pt is unsure.    Doxycycline Hives    Other Reaction(s): rash/swelling   Gabapentin  Swelling   Lyrica [Pregabalin]     Dry mouth and felt raw   Methylprednisolone  Sodium Succ     im depo medrol  caused dizziness - pt passed out. Ask pt before giving. Issue occurred when administered in the left arm    Penicillins Hives   Shellfish Allergy  Hives   Adhesive [Tape] Rash   Fentanyl  Rash    Reaction to adhesive, not the drug     Medications:   Current Outpatient Medications  Medication Sig Dispense Refill   acetaminophen  (TYLENOL ) 500 MG tablet Take 1,000 mg by mouth every 8 (eight) hours as needed for moderate pain (pain  score 4-6).     albuterol  (VENTOLIN  HFA) 108 (90 Base) MCG/ACT inhaler TAKE 2 PUFFS BY MOUTH EVERY 6 HOURS AS NEEDED FOR WHEEZE OR SHORTNESS OF BREATH 18 each 1   AMBULATORY NON FORMULARY MEDICATION Diltiazem 2%/lidocaine  5% apply pea sized amount three times daily for 2 months 30 g 0   amLODipine  (NORVASC ) 5 MG tablet Take 0.5 tablets (2.5 mg total) by mouth 2 (two) times daily. Take 1/2 tablet daily 30 tablet 0  aspirin  EC 81 MG tablet Take 81 mg by mouth at bedtime.     atorvastatin  (LIPITOR ) 80 MG tablet TAKE 1 TABLET BY MOUTH EVERYDAY AT BEDTIME 90 tablet 3   betamethasone  dipropionate 0.05 % cream Apply 1 Application topically 2 (two) times daily as needed (swelling/ itching).     cetirizine  (ZYRTEC ) 10 MG tablet Take 10 mg by mouth daily.     Cyanocobalamin  (B-12 COMPLIANCE INJECTION) 1000 MCG/ML KIT Inject 1,000 mcg as directed every 30 (thirty) days.     EPINEPHrine  0.3 mg/0.3 mL IJ SOAJ injection Inject 0.3 mg into the muscle as needed for anaphylaxis. 2 each 0   esomeprazole  (NEXIUM ) 40 MG capsule Take 1 capsule (40 mg total) by mouth 2 (two) times daily before a meal. 60 capsule 2   ezetimibe  (ZETIA ) 10 MG tablet Take 1 tablet (10 mg total) by mouth daily. 90 tablet 3   fluticasone  (FLONASE ) 50 MCG/ACT nasal spray Place 2 sprays into both nostrils daily. 48 g 1   furosemide  (LASIX ) 20 MG tablet Take 1 tablet (20 mg total) by mouth daily. 90 tablet 3   HYDROcodone -acetaminophen  (NORCO/VICODIN) 5-325 MG tablet Take 1 tablet by mouth every 6 (six) hours as needed for severe pain (pain score 7-10) or moderate pain (pain score 4-6). 10 tablet 0   isosorbide  mononitrate (IMDUR ) 120 MG 24 hr tablet Take 1 tablet (120 mg total) by mouth daily. 90 tablet 3   levothyroxine  (SYNTHROID ) 100 MCG tablet Take 1 tablet (100 mcg total) by mouth daily. 90 tablet 3   nitroGLYCERIN  (NITROSTAT ) 0.4 MG SL tablet Place 1 tablet (0.4 mg total) under the tongue every 5 (five) minutes as needed for chest pain.  25 tablet 3   olmesartan  (BENICAR ) 40 MG tablet Take 1 tablet (40 mg total) by mouth daily. 90 tablet 2   ondansetron  (ZOFRAN -ODT) 4 MG disintegrating tablet TAKE 1 TABLET BY MOUTH EVERY 8 HOURS AS NEEDED FOR NAUSEA AND VOMITING 20 tablet 0   PARoxetine  (PAXIL ) 20 MG tablet Take 1 tablet (20 mg total) by mouth daily. 90 tablet 3   promethazine  (PHENERGAN ) 25 MG tablet TAKE 0.5-1 TABLETS (12.5-25 MG TOTAL) BY MOUTH 2 (TWO) TIMES DAILY AS NEEDED FOR NAUSEA OR VOMITING. 45 tablet 1   ranolazine  (RANEXA ) 1000 MG SR tablet Take 1 tablet (1,000 mg total) by mouth 2 (two) times daily. 180 tablet 3   Current Facility-Administered Medications  Medication Dose Route Frequency Provider Last Rate Last Admin   cyanocobalamin  (VITAMIN B12) injection 1,000 mcg  1,000 mcg Intramuscular Q30 days Jolinda Potter M, DO   1,000 mcg at 04/07/24 0830    (Not in a hospital admission)   Family History  Problem Relation Age of Onset   Heart failure Mother 21       Died age 18   Congestive Heart Failure Mother    Heart attack Father 49       Died with MI   Heart attack Paternal Grandmother    Heart attack Paternal Grandfather    Colon cancer Neg Hx    Esophageal cancer Neg Hx    Pancreatic cancer Neg Hx    Stomach cancer Neg Hx      Review of Systems:   Review of Systems  Constitutional:  Positive for malaise/fatigue.  Respiratory:  Positive for shortness of breath.   Cardiovascular:  Positive for chest pain.  Neurological:  Positive for dizziness.      Physical Exam: BP 128/63 (BP Location: Left Arm, Patient  Position: Sitting, Cuff Size: Large)   Pulse (!) 54   Resp 20   Ht 5' 10 (1.778 m)   Wt 211 lb 9.6 oz (96 kg)   SpO2 96% Comment: RA  BMI 30.36 kg/m  Physical Exam Constitutional:      General: He is not in acute distress.    Appearance: He is not ill-appearing.  HENT:     Head: Normocephalic and atraumatic.  Eyes:     Extraocular Movements: Extraocular movements intact.   Cardiovascular:     Rate and Rhythm: Bradycardia present.  Pulmonary:     Effort: Pulmonary effort is normal. No respiratory distress.  Abdominal:     General: Abdomen is flat. There is no distension.  Musculoskeletal:        General: Normal range of motion.     Cervical back: Normal range of motion.  Skin:    General: Skin is warm and dry.         Comments: Incision on thumb and radial side of forearm  Neurological:     General: No focal deficit present.     Mental Status: He is alert and oriented to person, place, and time.       Diagnostic Studies & Laboratory data: Cardiac Studies & Procedures   ______________________________________________________________________________________________ CARDIAC CATHETERIZATION  CARDIAC CATHETERIZATION 03/19/2024  Conclusion   Mid LAD lesion is 30% stenosed.   Ost Cx to Prox Cx lesion is 50% stenosed.   Prox RCA to Mid RCA lesion is 100% stenosed.   Post intervention, there is a 100% residual stenosis.  Unsuccessful CTO PCI of the RCA. Unable to cross lesion with wire due to severe calcification   Plan: continue medical therapy. Will observe overnight.  Findings Coronary Findings Diagnostic  Dominance: Right  Left Anterior Descending Mid LAD lesion is 30% stenosed. The lesion was previously treated using a drug eluting stent over 2 years ago.  First Diagonal Branch Vessel is small in size.  Left Circumflex Ost Cx to Prox Cx lesion is 50% stenosed.  Right Coronary Artery Prox RCA to Mid RCA lesion is 100% stenosed. The lesion is chronically occluded. The lesion is severely calcified.  Right Posterior Descending Artery Collaterals RPDA filled by collaterals from 2nd Diag.  Intervention  Prox RCA to Mid RCA lesion Angioplasty CATH MACH1 8FR AL.75 90CM guide catheter was inserted. WIRE ASAHI CONFIANZA 300CM guidewire used to cross lesion. Post-Intervention Lesion Assessment The intervention was unsuccessful due to  inability to cross the lesion with wire. Pre-interventional TIMI flow is 0. Post-intervention TIMI flow is 0. No complications occurred at this lesion. There is a 100% residual stenosis post intervention.   CARDIAC CATHETERIZATION  CARDIAC CATHETERIZATION 02/26/2024  Conclusion   Ost Cx to Prox Cx lesion is 50% stenosed.   Mid LAD lesion is 30% stenosed.   Prox RCA to Mid RCA lesion is 100% stenosed.   The left ventricular systolic function is normal.   LV end diastolic pressure is normal.   The left ventricular ejection fraction is 55-65% by visual estimate.  Single vessel occlusive CAD. Continued patency of stent in the LAD. The RCA is occluded in the mid vessel with excellent left to right collaterals from the second diagonal Normal LV function Normal LVEDP  Plan: patient has not been on any anti-anginal therapy. Will add Toprol  XL 25 mg daily and Imdur  30 mg daily. Stop HCT to allow titration of medication. If he has refractory angina despite optimal medical therapy could consider CTO PCI.  Findings Coronary Findings Diagnostic  Dominance: Right  Left Anterior Descending Mid LAD lesion is 30% stenosed. The lesion was previously treated using a drug eluting stent over 2 years ago.  First Diagonal Branch Vessel is small in size.  Left Circumflex Ost Cx to Prox Cx lesion is 50% stenosed.  Right Coronary Artery Prox RCA to Mid RCA lesion is 100% stenosed. The lesion is chronically occluded. The lesion is severely calcified.  Right Posterior Descending Artery Collaterals RPDA filled by collaterals from 2nd Diag.  Intervention  No interventions have been documented.   STRESS TESTS  NM MYOCAR MULTI W/SPECT W 02/17/2024  Narrative   Inferior defect difficult to interpret in setting of adjacent gut radiotracer uptake. Findings suggest probable inferior infarct with mild to moderate peri-infarct ischemia. Overall low to intermediate risk findings.   No ST deviation was  noted.   LV perfusion is abnormal. There is a moderate size moderate intensity inferior defect with mild to moderate reversibility. Interpretation is affected by significant adjacent gut radiotracer uptake.   Left ventricular function is normal. End diastolic cavity size is normal.   ECHOCARDIOGRAM  ECHOCARDIOGRAM COMPLETE 12/24/2023  Narrative ECHOCARDIOGRAM REPORT    Patient Name:   Jeffrey Hooper Date of Exam: 12/24/2023 Medical Rec #:  [id number]       Height:       70.0 in Accession #:    [id number]      Weight:       211.2 lb Date of Birth:  [birth year]       BSA:          2.136 m Patient Age:    62 years        BP:           135/68 mmHg Patient Gender: M               HR:           49 bpm. Exam Location:  Inpatient  Procedure: 2D Echo, Color Doppler and Cardiac Doppler (Both Spectral and Color Flow Doppler were utilized during procedure).  Indications:    R94.31 Abnormal EKG  History:        Patient has prior history of Echocardiogram examinations, most recent 11/25/2015. CAD; Risk Factors:Hypertension and Dyslipidemia.  Sonographer:    Damien Senior RDCS Referring Phys: [id number] EARLENE CHRISTELLA CLUSTER  IMPRESSIONS   1. Left ventricular ejection fraction, by estimation, is 60 to 65%. The left ventricle has normal function. The left ventricle has no regional wall motion abnormalities. There is mild concentric left ventricular hypertrophy. Left ventricular diastolic parameters were normal. 2. Right ventricular systolic function is normal. The right ventricular size is mildly enlarged. Tricuspid regurgitation signal is inadequate for assessing PA pressure. 3. Left atrial size was mildly dilated. 4. The mitral valve is normal in structure. Trivial mitral valve regurgitation. 5. The aortic valve is tricuspid. Aortic valve regurgitation is not visualized. 6. There is borderline dilatation of the ascending aorta, measuring 39 mm. 7. The inferior vena cava is normal in size with  greater than 50% respiratory variability, suggesting right atrial pressure of 3 mmHg.  Comparison(s): Prior images unable to be directly viewed, comparison made by report only.  FINDINGS Left Ventricle: Left ventricular ejection fraction, by estimation, is 60 to 65%. The left ventricle has normal function. The left ventricle has no regional wall motion abnormalities. The left ventricular internal cavity size was normal in size. There is mild concentric left ventricular hypertrophy. Left ventricular diastolic parameters  were normal.  Right Ventricle: The right ventricular size is mildly enlarged. No increase in right ventricular wall thickness. Right ventricular systolic function is normal. Tricuspid regurgitation signal is inadequate for assessing PA pressure.  Left Atrium: Left atrial size was mildly dilated.  Right Atrium: Right atrial size was normal in size.  Pericardium: Trivial pericardial effusion is present.  Mitral Valve: The mitral valve is normal in structure. Trivial mitral valve regurgitation.  Tricuspid Valve: The tricuspid valve is normal in structure. Tricuspid valve regurgitation is trivial.  Aortic Valve: The aortic valve is tricuspid. Aortic valve regurgitation is not visualized.  Pulmonic Valve: The pulmonic valve was normal in structure. Pulmonic valve regurgitation is trivial.  Aorta: The aortic root is normal in size and structure. Ascending aorta measurements are within normal limits for age when indexed to body surface area. There is borderline dilatation of the ascending aorta, measuring 39 mm.  Venous: The inferior vena cava is normal in size with greater than 50% respiratory variability, suggesting right atrial pressure of 3 mmHg.  IAS/Shunts: The atrial septum is grossly normal.   LEFT VENTRICLE PLAX 2D LVIDd:         4.50 cm   Diastology LVIDs:         3.20 cm   LV e' medial:    8.49 cm/s LV PW:         1.10 cm   LV E/e' medial:  9.3 LV IVS:         1.20 cm   LV e' lateral:   10.90 cm/s LVOT diam:     2.30 cm   LV E/e' lateral: 7.2 LV SV:         125 LV SV Index:   58 LVOT Area:     4.15 cm   RIGHT VENTRICLE RV S prime:     14.10 cm/s TAPSE (M-mode): 3.1 cm  LEFT ATRIUM             Index        RIGHT ATRIUM           Index LA diam:        4.00 cm 1.87 cm/m   RA Area:     19.00 cm LA Vol (A2C):   68.6 ml 32.11 ml/m  RA Volume:   55.20 ml  25.84 ml/m LA Vol (A4C):   65.8 ml 30.80 ml/m LA Biplane Vol: 69.3 ml 32.44 ml/m AORTIC VALVE LVOT Vmax:   130.00 cm/s LVOT Vmean:  92.400 cm/s LVOT VTI:    0.300 m  AORTA Ao Root diam: 3.40 cm Ao Asc diam:  3.90 cm  MITRAL VALVE MV Area (PHT): 3.07 cm    SHUNTS MV Decel Time: 247 msec    Systemic VTI:  0.30 m MV E velocity: 78.60 cm/s  Systemic Diam: 2.30 cm MV A velocity: 58.20 cm/s MV E/A ratio:  1.35  Mihai Croitoru MD Electronically signed by Jerel Balding MD Signature Date/Time: 12/24/2023/3:45:21 PM    Final    MONITORS  LONG TERM MONITOR-LIVE TELEMETRY (3-14 DAYS) 01/20/2024  Narrative Predominant rhythm normal sinus rhythm Occasional supraventricular tachycardia brief runs with the longest being 16 beats. Possible brief runs of atrial tachycardia. No sustained arrhythmias.       ______________________________________________________________________________________________     EKG: Sinus I have independently reviewed the above radiologic studies and discussed with the patient   Recent Lab Findings: Lab Results  Component Value Date   WBC 6.6 04/24/2024   HGB 13.4 04/24/2024  HCT 39.7 04/24/2024   PLT 216 04/24/2024   GLUCOSE 101 (H) 04/24/2024   CHOL 142 12/23/2023   TRIG 133 12/23/2023   HDL 46 12/23/2023   LDLCALC 69 12/23/2023   ALT 32 01/21/2024   AST 19 01/21/2024   NA 136 04/24/2024   K 3.7 04/24/2024   CL 103 04/24/2024   CREATININE 1.27 (H) 04/24/2024   BUN 25 (H) 04/24/2024   CO2 24 04/24/2024   TSH 2.800 12/23/2023   INR  1.0 01/21/2024   HGBA1C 5.7 (H) 08/30/2023      Assessment / Plan:   63 y.o. male with isolated RCA disease.  He has previous PCI to the LAD but the stent is patent.  Echocardiogram reveals preserved biventricular function and no significant valvular disease.  We discussed the risks and benefits of an off-pump CABG x 1.  I think the use of the radial artery along with a small piece of vein would be ideal.  He has had surgery to his left hand in the past and has residual neuropathy.  There is an incision along his thumb and the dorsal portion of his forearm.  I think that a radial harvest should still be possible, but if there is too much scar tissue that we will just take 1 segment of vein.     I  spent 40 minutes counseling the patient face to face.   Jeffrey Hooper 05/01/2024 12:55 PM

## 2024-05-07 ENCOUNTER — Ambulatory Visit: Admitting: Internal Medicine

## 2024-05-08 ENCOUNTER — Ambulatory Visit (INDEPENDENT_AMBULATORY_CARE_PROVIDER_SITE_OTHER): Admitting: *Deleted

## 2024-05-08 DIAGNOSIS — E538 Deficiency of other specified B group vitamins: Secondary | ICD-10-CM

## 2024-05-08 NOTE — Progress Notes (Signed)
 Patient is in office today for a nurse visit for B12 Injection. Patient Injection was given in the  Left deltoid. Patient tolerated injection well.

## 2024-05-18 NOTE — Pre-Procedure Instructions (Signed)
 Surgical Instructions   Your procedure is scheduled on May 21, 2024. Report to [facility] Main Entrance A at 5:30 A.M., then check in with the Admitting office. Any questions or running late day of surgery: call [phone]  Questions prior to your surgery date: call [phone], Monday-Friday, 8am-4pm. If you experience any cold or flu symptoms such as cough, fever, chills, shortness of breath, etc. between now and your scheduled surgery, please notify us  at the above number.     Remember:  Do not eat or drink after midnight the night before your surgery    Take these medicines the morning of surgery with A SIP OF WATER : amLODipine  (NORVASC )  cetirizine  (ZYRTEC )  isosorbide  mononitrate (IMDUR )  levothyroxine  (SYNTHROID )  pantoprazole  (PROTONIX )  ranolazine  (RANEXA )    May take these medicines IF NEEDED: acetaminophen  (TYLENOL )  albuterol  (VENTOLIN  HFA) inhaler - please bring your inhaler with you morning of surgery EPINEPHrine  Pen fluticasone  (FLONASE ) nasal spray  HYDROcodone -acetaminophen  (NORCO/VICODIN)  nitroGLYCERIN  (NITROSTAT ) - if dose taken prior to surgery, please call [phone] ondansetron  (ZOFRAN -ODT)    Continue taking your Aspirin  through the day before surgery. DO NOT take any the morning of surgery.   One week prior to surgery, STOP taking any Aleve, Naproxen, Ibuprofen, Motrin, Advil, Goody's, BC's, all herbal medications, fish oil, and non-prescription vitamins.                     Do NOT Smoke (Tobacco/Vaping) for 24 hours prior to your procedure.  If you use a CPAP at night, you may bring your mask/headgear for your overnight stay.   You will be asked to remove any contacts, glasses, piercing's, hearing aid's, dentures/partials prior to surgery. Please bring cases for these items if needed.    Patients discharged the day of surgery will not be allowed to drive home, and someone needs to stay with them for 24 hours.  SURGICAL WAITING ROOM  VISITATION Patients may have no more than 2 support people in the waiting area - these visitors may rotate.   Pre-op nurse will coordinate an appropriate time for 1 ADULT support person, who may not rotate, to accompany patient in pre-op.  Children under the age of 71 must have an adult with them who is not the patient and must remain in the main waiting area with an adult.  If the patient needs to stay at the hospital during part of their recovery, the visitor guidelines for inpatient rooms apply.  Please refer to the [facility] website for the visitor guidelines for any additional information.   If you received a COVID test during your pre-op visit  it is requested that you wear a mask when out in public, stay away from anyone that may not be feeling well and notify your surgeon if you develop symptoms. If you have been in contact with anyone that has tested positive in the last 10 days please notify you surgeon.      Pre-operative CHG Bathing Instructions   You can play a key role in reducing the risk of infection after surgery. Your skin needs to be as free of germs as possible. You can reduce the number of germs on your skin by washing with CHG (chlorhexidine  gluconate) soap before surgery. CHG is an antiseptic soap that kills germs and continues to kill germs even after washing.   DO NOT use if you have an allergy  to chlorhexidine /CHG or antibacterial soaps. If your skin becomes reddened or irritated, stop using the  CHG and notify one of our RNs at [phone].              TAKE A SHOWER THE NIGHT BEFORE SURGERY   Please keep in mind the following:  DO NOT shave, including legs and underarms, 48 hours prior to surgery.   You may shave your face before/day of surgery.  Place clean sheets on your bed the night before surgery Use a clean washcloth (not used since being washed) for shower. DO NOT sleep with pet's night before surgery.  CHG Shower Instructions:  Wash your face and  private area with normal soap. If you choose to wash your hair, wash first with your normal shampoo.  After you use shampoo/soap, rinse your hair and body thoroughly to remove shampoo/soap residue.  Turn the water  OFF and apply half the bottle of CHG soap to a CLEAN washcloth.  Apply CHG soap ONLY FROM YOUR NECK DOWN TO YOUR TOES (washing for 3-5 minutes)  DO NOT use CHG soap on face, private areas, open wounds, or sores.  Pay special attention to the area where your surgery is being performed.  If you are having back surgery, having someone wash your back for you may be helpful. Wait 2 minutes after CHG soap is applied, then you may rinse off the CHG soap.  Pat dry with a clean towel  Put on clean pajamas    Additional instructions for the day of surgery: If you choose, you may shower the morning of surgery with an antibacterial soap.  DO NOT APPLY any lotions, deodorants, cologne, or perfumes.   Do not wear jewelry or makeup Do not wear nail polish, gel polish, artificial nails, or any other type of covering on natural nails (fingers and toes) Do not bring valuables to the hospital. [facility] is not responsible for valuables/personal belongings. Put on clean/comfortable clothes.  Please brush your teeth.  Ask your nurse before applying any prescription medications to the skin.

## 2024-05-19 ENCOUNTER — Encounter (HOSPITAL_COMMUNITY): Payer: Self-pay

## 2024-05-19 ENCOUNTER — Ambulatory Visit (HOSPITAL_COMMUNITY)
Admission: RE | Admit: 2024-05-19 | Discharge: 2024-05-19 | Disposition: A | Source: Ambulatory Visit | Attending: Thoracic Surgery (Cardiothoracic Vascular Surgery)

## 2024-05-19 ENCOUNTER — Ambulatory Visit (HOSPITAL_BASED_OUTPATIENT_CLINIC_OR_DEPARTMENT_OTHER)
Admission: RE | Admit: 2024-05-19 | Discharge: 2024-05-19 | Disposition: A | Discharge status: Home / Self Care | Source: Ambulatory Visit | Attending: Thoracic Surgery (Cardiothoracic Vascular Surgery) | Admitting: Thoracic Surgery (Cardiothoracic Vascular Surgery)

## 2024-05-19 ENCOUNTER — Other Ambulatory Visit: Payer: Self-pay

## 2024-05-19 ENCOUNTER — Inpatient Hospital Stay (HOSPITAL_COMMUNITY)
Admission: RE | Admit: 2024-05-19 | Discharge: 2024-05-19 | Disposition: A | Source: Ambulatory Visit | Attending: Thoracic Surgery (Cardiothoracic Vascular Surgery)

## 2024-05-19 VITALS — BP 124/68 | HR 52 | Temp 97.8°F | Resp 18 | Ht 70.0 in | Wt 206.2 lb

## 2024-05-19 DIAGNOSIS — Z5181 Encounter for therapeutic drug level monitoring: Secondary | ICD-10-CM | POA: Insufficient documentation

## 2024-05-19 DIAGNOSIS — R7989 Other specified abnormal findings of blood chemistry: Secondary | ICD-10-CM | POA: Insufficient documentation

## 2024-05-19 DIAGNOSIS — Z01818 Encounter for other preprocedural examination: Secondary | ICD-10-CM

## 2024-05-19 DIAGNOSIS — I2510 Atherosclerotic heart disease of native coronary artery without angina pectoris: Secondary | ICD-10-CM | POA: Diagnosis not present

## 2024-05-19 HISTORY — DX: Failed or difficult intubation, initial encounter: T88.4XXA

## 2024-05-19 HISTORY — DX: Personal history of other diseases of the digestive system: Z87.19

## 2024-05-19 LAB — COMPREHENSIVE METABOLIC PANEL WITH GFR
ALT: 26 U/L (ref 0–44)
AST: 22 U/L (ref 15–41)
Albumin: 3.8 g/dL (ref 3.5–5.0)
Alkaline Phosphatase: 85 U/L (ref 38–126)
Anion gap: 11 (ref 5–15)
BUN: 19 mg/dL (ref 8–23)
CO2: 23 mmol/L (ref 22–32)
Calcium: 8.9 mg/dL (ref 8.9–10.3)
Chloride: 104 mmol/L (ref 98–111)
Creatinine, Ser: 1.24 mg/dL (ref 0.61–1.24)
GFR, Estimated: >60 mL/min (ref >60)
Glucose, Bld: 75 mg/dL (ref 70–99)
Potassium: 3.6 mmol/L (ref 3.5–5.1)
Sodium: 138 mmol/L (ref 135–145)
Total Bilirubin: 1.2 mg/dL (ref 0.0–1.2)
Total Protein: 6.5 g/dL (ref 6.5–8.1)

## 2024-05-19 LAB — CBC
HCT: 40.3 % (ref 39.0–52.0)
Hemoglobin: 13.8 g/dL (ref 13.0–17.0)
MCH: 31.4 pg (ref 26.0–34.0)
MCHC: 34.2 g/dL (ref 30.0–36.0)
MCV: 91.8 fL (ref 80.0–100.0)
Platelets: 261 K/uL (ref 150–400)
RBC: 4.39 MIL/uL (ref 4.22–5.81)
RDW: 12.4 % (ref 11.5–15.5)
WBC: 8 K/uL (ref 4.0–10.5)
nRBC: 0 % (ref 0.0–0.2)

## 2024-05-19 LAB — URINALYSIS, ROUTINE W REFLEX MICROSCOPIC
Bilirubin Urine: NEGATIVE
Glucose, UA: NEGATIVE mg/dL
Hgb urine dipstick: NEGATIVE
Ketones, ur: NEGATIVE mg/dL
Leukocytes,Ua: NEGATIVE
Nitrite: NEGATIVE
Protein, ur: NEGATIVE mg/dL
Specific Gravity, Urine: 1.028 (ref 1.005–1.030)
pH: 5 (ref 5.0–8.0)

## 2024-05-19 LAB — SURGICAL PCR SCREEN
MRSA, PCR: NEGATIVE
Staphylococcus aureus: NEGATIVE

## 2024-05-19 LAB — PROTIME-INR
INR: 1.1 (ref 0.8–1.2)
Prothrombin Time: 14.3 s (ref 11.4–15.2)

## 2024-05-19 LAB — APTT: aPTT: 42 s — ABNORMAL HIGH (ref 24–36)

## 2024-05-19 LAB — HEMOGLOBIN A1C
Hgb A1c MFr Bld: 5.8 % — ABNORMAL HIGH (ref 4.8–5.6)
Mean Plasma Glucose: 119.76 mg/dL

## 2024-05-19 LAB — TYPE AND SCREEN
ABO/RH(D): A POS
Antibody Screen: NEGATIVE

## 2024-05-19 LAB — VAS US DOPPLER PRE CABG
Left ABI: 1.3
Right ABI: 1.3

## 2024-05-19 NOTE — Progress Notes (Signed)
 PCP - Norene Fielding, DO Cardiologist - Dr. Peter [place] - last office visit 04/09/2024  PPM/ICD - Denies Device Orders - n/a Rep Notified - n/a  Chest x-ray - 05/19/2024 EKG - 04/24/2024 Stress Test - 02/17/2024 ECHO - 12/24/2023 Cardiac Cath - 03/19/2024  Sleep Study - Denies CPAP - n/a  No DM  Last dose of GLP1 agonist- n/a GLP1 instructions: n/a  Blood Thinner Instructions: n/a Aspirin  Instructions: Pt instructed to continue taking ASA through the day before surgery and none the morning of surgery  NPO after midnight  COVID TEST- n/a   Anesthesia review: Yes. Hx of CAD/HTN. Pt endorses using his nitroglycerin  daily for CP. Symptoms usually resolve after one dose. Education reviewed with pt that he can take up to three doses, 5 minutes apart. If CP does not resolve, then he is to call 911 and be brought to the hospital. Pt understood instructions. Pt also has a spinal cord stimulator and was instructed to bring the remote with him morning of surgery (these instructions were also written on his paperwork that was sent home).  Patient denies shortness of breath, fever, cough and chest pain at PAT appointment. Pt denies any respiratory illness/infection in the last two months.   All instructions explained to the patient, with a verbal understanding of the material. Patient agrees to go over the instructions while at home for a better understanding. Patient also instructed to self quarantine after being tested for COVID-19. The opportunity to ask questions was provided.

## 2024-05-20 ENCOUNTER — Encounter (HOSPITAL_COMMUNITY): Payer: Self-pay

## 2024-05-20 MED ORDER — EPINEPHRINE HCL 5 MG/250ML IV SOLN IN NS
0.0000 ug/min | INTRAVENOUS | Status: DC
  Filled 2024-05-20: qty 250

## 2024-05-20 MED ORDER — PHENYLEPHRINE HCL-NACL 20-0.9 MG/250ML-% IV SOLN
30.0000 ug/min | INTRAVENOUS | Status: DC
  Filled 2024-05-20: qty 250

## 2024-05-20 MED ORDER — POTASSIUM CHLORIDE 2 MEQ/ML IV SOLN
80.0000 meq | INTRAVENOUS | Status: DC
  Filled 2024-05-20: qty 40

## 2024-05-20 MED ORDER — NOREPINEPHRINE 4 MG/250ML-% IV SOLN
0.0000 ug/min | INTRAVENOUS | Status: AC
Start: 2024-05-21 — End: 2024-05-22
  Administered 2024-05-21: 1 ug/min via INTRAVENOUS
  Filled 2024-05-20: qty 250

## 2024-05-20 MED ORDER — MILRINONE LACTATE IN DEXTROSE 20-5 MG/100ML-% IV SOLN
0.3000 ug/kg/min | INTRAVENOUS | Status: DC
  Filled 2024-05-20: qty 100

## 2024-05-20 MED ORDER — CEFAZOLIN SODIUM-DEXTROSE 2-4 GM/100ML-% IV SOLN
2.0000 g | INTRAVENOUS | Status: AC
  Administered 2024-05-21: 2 g via INTRAVENOUS
  Filled 2024-05-20: qty 100

## 2024-05-20 MED ORDER — MANNITOL 20 % IV SOLN
INTRAVENOUS | Status: DC
  Filled 2024-05-20: qty 13

## 2024-05-20 MED ORDER — NITROGLYCERIN IN D5W 200-5 MCG/ML-% IV SOLN
2.0000 ug/min | INTRAVENOUS | Status: DC
  Filled 2024-05-20: qty 250

## 2024-05-20 MED ORDER — INSULIN REGULAR(HUMAN) IN NACL 100-0.9 UT/100ML-% IV SOLN
INTRAVENOUS | Status: AC
  Administered 2024-05-21: .5 [IU]/h via INTRAVENOUS
  Filled 2024-05-20: qty 100

## 2024-05-20 MED ORDER — SODIUM CHLORIDE 0.9 % IV SOLN
2.5000 mg/kg/h | INTRAVENOUS | Status: DC
  Filled 2024-05-20: qty 250

## 2024-05-20 MED ORDER — TRANEXAMIC ACID 1000 MG/10ML IV SOLN
1.5000 mg/kg/h | INTRAVENOUS | Status: AC
  Administered 2024-05-21: 1.5 mg/kg/h via INTRAVENOUS
  Filled 2024-05-20: qty 25

## 2024-05-20 MED ORDER — CEFAZOLIN SODIUM-DEXTROSE 2-4 GM/100ML-% IV SOLN
2.0000 g | INTRAVENOUS | Status: DC
  Filled 2024-05-20: qty 100

## 2024-05-20 MED ORDER — TRANEXAMIC ACID (OHS) BOLUS VIA INFUSION
15.0000 mg/kg | INTRAVENOUS | Status: AC
  Administered 2024-05-21: 1402.5 mg via INTRAVENOUS
  Filled 2024-05-20: qty 1403

## 2024-05-20 MED ORDER — PLASMA-LYTE A IV SOLN
INTRAVENOUS | Status: DC
  Filled 2024-05-20: qty 1

## 2024-05-20 MED ORDER — VANCOMYCIN HCL 1.5 G IV SOLR
1500.0000 mg | INTRAVENOUS | Status: AC
  Administered 2024-05-21: 1500 mg via INTRAVENOUS
  Filled 2024-05-20: qty 30

## 2024-05-20 MED ORDER — DEXMEDETOMIDINE HCL IN NACL 400 MCG/100ML IV SOLN
0.1000 ug/kg/h | INTRAVENOUS | Status: AC
  Administered 2024-05-21: .7 ug/kg/h via INTRAVENOUS
  Filled 2024-05-20: qty 100

## 2024-05-20 MED ORDER — TRANEXAMIC ACID (OHS) PUMP PRIME SOLUTION
2.0000 mg/kg | INTRAVENOUS | Status: DC
  Filled 2024-05-20: qty 1.87

## 2024-05-20 MED ORDER — BIVALIRUDIN LOAD/BOLUS VIA INFUSION (OHS/POSSIBLE HIT) OPTIME
1.0000 mg/kg | INTRAVENOUS | Status: DC
  Filled 2024-05-20: qty 94

## 2024-05-20 NOTE — Anesthesia Preprocedure Evaluation (Signed)
 Anesthesia Evaluation  Patient identified by MRN, date of birth, ID band Patient awake    Reviewed: Allergy  & Precautions, H&P , NPO status , Patient's Chart, lab work & pertinent test results  History of Anesthesia Complications (+) DIFFICULT AIRWAY and history of anesthetic complications  Airway Mallampati: III  TM Distance: >3 FB Neck ROM: Limited    Dental no notable dental hx. (+) Teeth Intact, Dental Advisory Given   Pulmonary neg pulmonary ROS, former smoker   Pulmonary exam normal breath sounds clear to auscultation       Cardiovascular Exercise Tolerance: Good hypertension, Pt. on medications + angina  + CAD   Rhythm:Regular Rate:Normal     Neuro/Psych   Anxiety     negative neurological ROS     GI/Hepatic Neg liver ROS, hiatal hernia,GERD  Medicated,,  Endo/Other  Hypothyroidism    Renal/GU negative Renal ROS  negative genitourinary   Musculoskeletal  (+) Arthritis , Osteoarthritis,    Abdominal   Peds  Hematology  (+) Blood dyscrasia, anemia   Anesthesia Other Findings   Reproductive/Obstetrics negative OB ROS                              Anesthesia Physical Anesthesia Plan  ASA: 4  Anesthesia Plan: General   Post-op Pain Management: Tylenol  PO (pre-op)*   Induction: Intravenous  PONV Risk Score and Plan: 2 and Ondansetron , Dexamethasone  and Midazolam   Airway Management Planned: Oral ETT and Video Laryngoscope Planned  Additional Equipment: Arterial line, CVP, TEE and Ultrasound Guidance Line Placement  Intra-op Plan:   Post-operative Plan: Post-operative intubation/ventilation  Informed Consent: I have reviewed the patients History and Physical, chart, labs and discussed the procedure including the risks, benefits and alternatives for the proposed anesthesia with the patient or authorized representative who has indicated his/her understanding and acceptance.      Dental advisory given  Plan Discussed with: CRNA  Anesthesia Plan Comments:          Anesthesia Quick Evaluation

## 2024-05-21 ENCOUNTER — Inpatient Hospital Stay (HOSPITAL_COMMUNITY)

## 2024-05-21 ENCOUNTER — Ambulatory Visit: Admitting: Cardiology

## 2024-05-21 ENCOUNTER — Inpatient Hospital Stay (HOSPITAL_COMMUNITY): Payer: Self-pay | Admitting: Certified Registered Nurse Anesthetist

## 2024-05-21 ENCOUNTER — Encounter (HOSPITAL_COMMUNITY): Payer: Self-pay | Admitting: Thoracic Surgery (Cardiothoracic Vascular Surgery)

## 2024-05-21 ENCOUNTER — Other Ambulatory Visit: Payer: Self-pay

## 2024-05-21 ENCOUNTER — Inpatient Hospital Stay (HOSPITAL_COMMUNITY)
Admission: RE | Disposition: A | Payer: Self-pay | Source: Home / Self Care | Attending: Thoracic Surgery (Cardiothoracic Vascular Surgery)

## 2024-05-21 ENCOUNTER — Inpatient Hospital Stay (HOSPITAL_COMMUNITY)
Admission: RE | Admit: 2024-05-21 | Discharge: 2024-05-26 | DRG: 235 | Disposition: A | Discharge status: Home Health | Attending: Thoracic Surgery (Cardiothoracic Vascular Surgery) | Admitting: Thoracic Surgery (Cardiothoracic Vascular Surgery)

## 2024-05-21 DIAGNOSIS — Z79890 Hormone replacement therapy (postmenopausal): Secondary | ICD-10-CM

## 2024-05-21 DIAGNOSIS — I2510 Atherosclerotic heart disease of native coronary artery without angina pectoris: Secondary | ICD-10-CM

## 2024-05-21 DIAGNOSIS — E119 Type 2 diabetes mellitus without complications: Secondary | ICD-10-CM | POA: Diagnosis present

## 2024-05-21 DIAGNOSIS — Z8249 Family history of ischemic heart disease and other diseases of the circulatory system: Secondary | ICD-10-CM | POA: Diagnosis not present

## 2024-05-21 DIAGNOSIS — I480 Paroxysmal atrial fibrillation: Secondary | ICD-10-CM | POA: Diagnosis present

## 2024-05-21 DIAGNOSIS — D62 Acute posthemorrhagic anemia: Secondary | ICD-10-CM | POA: Diagnosis not present

## 2024-05-21 DIAGNOSIS — Z880 Allergy status to penicillin: Secondary | ICD-10-CM

## 2024-05-21 DIAGNOSIS — Z981 Arthrodesis status: Secondary | ICD-10-CM

## 2024-05-21 DIAGNOSIS — Z87891 Personal history of nicotine dependence: Secondary | ICD-10-CM

## 2024-05-21 DIAGNOSIS — Z7982 Long term (current) use of aspirin: Secondary | ICD-10-CM

## 2024-05-21 DIAGNOSIS — F32A Depression, unspecified: Secondary | ICD-10-CM | POA: Diagnosis present

## 2024-05-21 DIAGNOSIS — Z888 Allergy status to other drugs, medicaments and biological substances status: Secondary | ICD-10-CM | POA: Diagnosis not present

## 2024-05-21 DIAGNOSIS — Z881 Allergy status to other antibiotic agents status: Secondary | ICD-10-CM

## 2024-05-21 DIAGNOSIS — E039 Hypothyroidism, unspecified: Secondary | ICD-10-CM | POA: Diagnosis present

## 2024-05-21 DIAGNOSIS — Z01818 Encounter for other preprocedural examination: Secondary | ICD-10-CM

## 2024-05-21 DIAGNOSIS — D696 Thrombocytopenia, unspecified: Secondary | ICD-10-CM | POA: Diagnosis not present

## 2024-05-21 DIAGNOSIS — Z79899 Other long term (current) drug therapy: Secondary | ICD-10-CM | POA: Diagnosis not present

## 2024-05-21 DIAGNOSIS — N400 Enlarged prostate without lower urinary tract symptoms: Secondary | ICD-10-CM | POA: Diagnosis present

## 2024-05-21 DIAGNOSIS — I10 Essential (primary) hypertension: Secondary | ICD-10-CM | POA: Diagnosis present

## 2024-05-21 DIAGNOSIS — Z91013 Allergy to seafood: Secondary | ICD-10-CM

## 2024-05-21 DIAGNOSIS — E785 Hyperlipidemia, unspecified: Secondary | ICD-10-CM | POA: Diagnosis present

## 2024-05-21 DIAGNOSIS — I700 Atherosclerosis of aorta: Secondary | ICD-10-CM | POA: Diagnosis present

## 2024-05-21 DIAGNOSIS — K219 Gastro-esophageal reflux disease without esophagitis: Secondary | ICD-10-CM | POA: Diagnosis present

## 2024-05-21 DIAGNOSIS — R9439 Abnormal result of other cardiovascular function study: Secondary | ICD-10-CM

## 2024-05-21 DIAGNOSIS — J3089 Other allergic rhinitis: Secondary | ICD-10-CM

## 2024-05-21 DIAGNOSIS — Z885 Allergy status to narcotic agent status: Secondary | ICD-10-CM | POA: Diagnosis not present

## 2024-05-21 DIAGNOSIS — E8770 Fluid overload, unspecified: Secondary | ICD-10-CM | POA: Diagnosis not present

## 2024-05-21 DIAGNOSIS — Z951 Presence of aortocoronary bypass graft: Secondary | ICD-10-CM | POA: Diagnosis not present

## 2024-05-21 DIAGNOSIS — Z91014 Allergy to mammalian meats: Secondary | ICD-10-CM

## 2024-05-21 DIAGNOSIS — Z955 Presence of coronary angioplasty implant and graft: Secondary | ICD-10-CM

## 2024-05-21 DIAGNOSIS — F419 Anxiety disorder, unspecified: Secondary | ICD-10-CM

## 2024-05-21 DIAGNOSIS — E782 Mixed hyperlipidemia: Secondary | ICD-10-CM | POA: Diagnosis present

## 2024-05-21 DIAGNOSIS — F411 Generalized anxiety disorder: Secondary | ICD-10-CM | POA: Diagnosis present

## 2024-05-21 DIAGNOSIS — J951 Acute pulmonary insufficiency following thoracic surgery: Secondary | ICD-10-CM | POA: Diagnosis not present

## 2024-05-21 DIAGNOSIS — Z9682 Presence of neurostimulator: Secondary | ICD-10-CM

## 2024-05-21 HISTORY — PX: RADIAL ARTERY HARVEST: SHX5067

## 2024-05-21 HISTORY — PX: CORONARY ARTERY BYPASS GRAFT: SHX141

## 2024-05-21 HISTORY — PX: INTRAOPERATIVE TRANSESOPHAGEAL ECHOCARDIOGRAM: SHX5062

## 2024-05-21 LAB — POCT I-STAT 7, (LYTES, BLD GAS, ICA,H+H)
Acid-base deficit: 2 mmol/L (ref 0.0–2.0)
Acid-base deficit: 2 mmol/L (ref 0.0–2.0)
Acid-base deficit: 2 mmol/L (ref 0.0–2.0)
Acid-base deficit: 1 mmol/L (ref 0.0–2.0)
Acid-base deficit: 4 mmol/L — ABNORMAL HIGH (ref 0.0–2.0)
Acid-base deficit: 4 mmol/L — ABNORMAL HIGH (ref 0.0–2.0)
Bicarbonate: 23.4 mmol/L (ref 20.0–28.0)
Bicarbonate: 21.5 mmol/L (ref 20.0–28.0)
Bicarbonate: 21.7 mmol/L (ref 20.0–28.0)
Bicarbonate: 23.4 mmol/L (ref 20.0–28.0)
Bicarbonate: 21 mmol/L (ref 20.0–28.0)
Bicarbonate: 21 mmol/L (ref 20.0–28.0)
Calcium, Ion: 1.2 mmol/L (ref 1.15–1.40)
Calcium, Ion: 1.16 mmol/L (ref 1.15–1.40)
Calcium, Ion: 1.16 mmol/L (ref 1.15–1.40)
Calcium, Ion: 1.2 mmol/L (ref 1.15–1.40)
Calcium, Ion: 1.15 mmol/L (ref 1.15–1.40)
Calcium, Ion: 1.15 mmol/L (ref 1.15–1.40)
HCT: 32 % — ABNORMAL LOW (ref 39.0–52.0)
HCT: 30 % — ABNORMAL LOW (ref 39.0–52.0)
HCT: 28 % — ABNORMAL LOW (ref 39.0–52.0)
HCT: 30 % — ABNORMAL LOW (ref 39.0–52.0)
HCT: 26 % — ABNORMAL LOW (ref 39.0–52.0)
HCT: 28 % — ABNORMAL LOW (ref 39.0–52.0)
Hemoglobin: 10.9 g/dL — ABNORMAL LOW (ref 13.0–17.0)
Hemoglobin: 10.2 g/dL — ABNORMAL LOW (ref 13.0–17.0)
Hemoglobin: 9.5 g/dL — ABNORMAL LOW (ref 13.0–17.0)
Hemoglobin: 10.2 g/dL — ABNORMAL LOW (ref 13.0–17.0)
Hemoglobin: 8.8 g/dL — ABNORMAL LOW (ref 13.0–17.0)
Hemoglobin: 9.5 g/dL — ABNORMAL LOW (ref 13.0–17.0)
O2 Saturation: 100 %
O2 Saturation: 100 %
O2 Saturation: 100 %
O2 Saturation: 97 %
O2 Saturation: 98 %
O2 Saturation: 97 %
Patient temperature: 33.9
Patient temperature: 36.5
Patient temperature: 36.9
Potassium: 3.6 mmol/L (ref 3.5–5.1)
Potassium: 3.5 mmol/L (ref 3.5–5.1)
Potassium: 3.4 mmol/L — ABNORMAL LOW (ref 3.5–5.1)
Potassium: 3.3 mmol/L — ABNORMAL LOW (ref 3.5–5.1)
Potassium: 3.9 mmol/L (ref 3.5–5.1)
Potassium: 3.8 mmol/L (ref 3.5–5.1)
Sodium: 140 mmol/L (ref 135–145)
Sodium: 141 mmol/L (ref 135–145)
Sodium: 139 mmol/L (ref 135–145)
Sodium: 142 mmol/L (ref 135–145)
Sodium: 139 mmol/L (ref 135–145)
Sodium: 140 mmol/L (ref 135–145)
TCO2: 25 mmol/L (ref 22–32)
TCO2: 23 mmol/L (ref 22–32)
TCO2: 23 mmol/L (ref 22–32)
TCO2: 25 mmol/L (ref 22–32)
TCO2: 22 mmol/L (ref 22–32)
TCO2: 22 mmol/L (ref 22–32)
pCO2 arterial: 41.9 mmHg (ref 32–48)
pCO2 arterial: 33.4 mmHg (ref 32–48)
pCO2 arterial: 32.1 mmHg (ref 32–48)
pCO2 arterial: 32 mmHg (ref 32–48)
pCO2 arterial: 36.4 mmHg (ref 32–48)
pCO2 arterial: 37.8 mmHg (ref 32–48)
pH, Arterial: 7.355 (ref 7.35–7.45)
pH, Arterial: 7.418 (ref 7.35–7.45)
pH, Arterial: 7.439 (ref 7.35–7.45)
pH, Arterial: 7.46 — ABNORMAL HIGH (ref 7.35–7.45)
pH, Arterial: 7.367 (ref 7.35–7.45)
pH, Arterial: 7.352 (ref 7.35–7.45)
pO2, Arterial: 365 mmHg — ABNORMAL HIGH (ref 83–108)
pO2, Arterial: 176 mmHg — ABNORMAL HIGH (ref 83–108)
pO2, Arterial: 221 mmHg — ABNORMAL HIGH (ref 83–108)
pO2, Arterial: 70 mmHg — ABNORMAL LOW (ref 83–108)
pO2, Arterial: 107 mmHg (ref 83–108)
pO2, Arterial: 96 mmHg (ref 83–108)

## 2024-05-21 LAB — POCT I-STAT, CHEM 8
BUN: 16 mg/dL (ref 8–23)
BUN: 14 mg/dL (ref 8–23)
BUN: 14 mg/dL (ref 8–23)
Calcium, Ion: 1.23 mmol/L (ref 1.15–1.40)
Calcium, Ion: 1.19 mmol/L (ref 1.15–1.40)
Calcium, Ion: 1.19 mmol/L (ref 1.15–1.40)
Chloride: 103 mmol/L (ref 98–111)
Chloride: 105 mmol/L (ref 98–111)
Chloride: 103 mmol/L (ref 98–111)
Creatinine, Ser: 1.2 mg/dL (ref 0.61–1.24)
Creatinine, Ser: 1 mg/dL (ref 0.61–1.24)
Creatinine, Ser: 1.1 mg/dL (ref 0.61–1.24)
Glucose, Bld: 98 mg/dL (ref 70–99)
Glucose, Bld: 97 mg/dL (ref 70–99)
Glucose, Bld: 124 mg/dL — ABNORMAL HIGH (ref 70–99)
HCT: 31 % — ABNORMAL LOW (ref 39.0–52.0)
HCT: 29 % — ABNORMAL LOW (ref 39.0–52.0)
HCT: 26 % — ABNORMAL LOW (ref 39.0–52.0)
Hemoglobin: 10.5 g/dL — ABNORMAL LOW (ref 13.0–17.0)
Hemoglobin: 9.9 g/dL — ABNORMAL LOW (ref 13.0–17.0)
Hemoglobin: 8.8 g/dL — ABNORMAL LOW (ref 13.0–17.0)
Potassium: 3.6 mmol/L (ref 3.5–5.1)
Potassium: 3.6 mmol/L (ref 3.5–5.1)
Potassium: 3.5 mmol/L (ref 3.5–5.1)
Sodium: 141 mmol/L (ref 135–145)
Sodium: 141 mmol/L (ref 135–145)
Sodium: 140 mmol/L (ref 135–145)
TCO2: 25 mmol/L (ref 22–32)
TCO2: 22 mmol/L (ref 22–32)
TCO2: 22 mmol/L (ref 22–32)

## 2024-05-21 LAB — CBC
HCT: 28.4 % — ABNORMAL LOW (ref 39.0–52.0)
HCT: 29.9 % — ABNORMAL LOW (ref 39.0–52.0)
Hemoglobin: 9.7 g/dL — ABNORMAL LOW (ref 13.0–17.0)
Hemoglobin: 10.1 g/dL — ABNORMAL LOW (ref 13.0–17.0)
MCH: 30.8 pg (ref 26.0–34.0)
MCH: 30.5 pg (ref 26.0–34.0)
MCHC: 34.2 g/dL (ref 30.0–36.0)
MCHC: 33.8 g/dL (ref 30.0–36.0)
MCV: 90.2 fL (ref 80.0–100.0)
MCV: 90.3 fL (ref 80.0–100.0)
Platelets: 132 K/uL — ABNORMAL LOW (ref 150–400)
Platelets: 165 K/uL (ref 150–400)
RBC: 3.15 MIL/uL — ABNORMAL LOW (ref 4.22–5.81)
RBC: 3.31 MIL/uL — ABNORMAL LOW (ref 4.22–5.81)
RDW: 12.2 % (ref 11.5–15.5)
RDW: 12.2 % (ref 11.5–15.5)
WBC: 8.8 K/uL (ref 4.0–10.5)
WBC: 9.2 K/uL (ref 4.0–10.5)
nRBC: 0 % (ref 0.0–0.2)
nRBC: 0 % (ref 0.0–0.2)

## 2024-05-21 LAB — BASIC METABOLIC PANEL WITH GFR
Anion gap: 9 (ref 5–15)
BUN: 12 mg/dL (ref 8–23)
CO2: 21 mmol/L — ABNORMAL LOW (ref 22–32)
Calcium: 8.1 mg/dL — ABNORMAL LOW (ref 8.9–10.3)
Chloride: 108 mmol/L (ref 98–111)
Creatinine, Ser: 1.12 mg/dL (ref 0.61–1.24)
GFR, Estimated: >60 mL/min (ref >60)
Glucose, Bld: 102 mg/dL — ABNORMAL HIGH (ref 70–99)
Potassium: 4 mmol/L (ref 3.5–5.1)
Sodium: 138 mmol/L (ref 135–145)

## 2024-05-21 LAB — GLUCOSE, CAPILLARY
Glucose-Capillary: 108 mg/dL — ABNORMAL HIGH (ref 70–99)
Glucose-Capillary: 109 mg/dL — ABNORMAL HIGH (ref 70–99)
Glucose-Capillary: 113 mg/dL — ABNORMAL HIGH (ref 70–99)
Glucose-Capillary: 103 mg/dL — ABNORMAL HIGH (ref 70–99)
Glucose-Capillary: 97 mg/dL (ref 70–99)
Glucose-Capillary: 104 mg/dL — ABNORMAL HIGH (ref 70–99)
Glucose-Capillary: 94 mg/dL (ref 70–99)
Glucose-Capillary: 114 mg/dL — ABNORMAL HIGH (ref 70–99)
Glucose-Capillary: 95 mg/dL (ref 70–99)
Glucose-Capillary: 127 mg/dL — ABNORMAL HIGH (ref 70–99)
Glucose-Capillary: 102 mg/dL — ABNORMAL HIGH (ref 70–99)
Glucose-Capillary: 97 mg/dL (ref 70–99)
Glucose-Capillary: 89 mg/dL (ref 70–99)

## 2024-05-21 LAB — ECHO INTRAOPERATIVE TEE
Height: 70 in
Weight: 3299.21 [oz_av]

## 2024-05-21 LAB — PROTIME-INR
INR: 1.2 (ref 0.8–1.2)
Prothrombin Time: 16.1 s — ABNORMAL HIGH (ref 11.4–15.2)

## 2024-05-21 LAB — MAGNESIUM: Magnesium: 2.7 mg/dL — ABNORMAL HIGH (ref 1.7–2.4)

## 2024-05-21 LAB — APTT: aPTT: 37 s — ABNORMAL HIGH (ref 24–36)

## 2024-05-21 SURGERY — OFF PUMP CORONARY ARTERY BYPASS GRAFTING (CABG)
Anesthesia: General | Site: Chest

## 2024-05-21 MED ORDER — PANTOPRAZOLE SODIUM 40 MG PO TBEC: 40.0000 mg | DELAYED_RELEASE_TABLET | Freq: Every day | ORAL | Status: DC

## 2024-05-21 MED ORDER — ONDANSETRON HCL 4 MG/2ML IJ SOLN: 4.0000 mg | Freq: Four times a day (QID) | INTRAMUSCULAR | Status: DC | PRN

## 2024-05-21 MED ORDER — SODIUM CHLORIDE (PF) 0.9 % IJ SOLN: OROMUCOSAL | Status: DC | PRN

## 2024-05-21 MED ORDER — SODIUM CHLORIDE 0.45 % IV SOLN: INTRAVENOUS | Status: AC | PRN

## 2024-05-21 MED ORDER — MIDAZOLAM HCL (PF) 5 MG/ML IJ SOLN
INTRAMUSCULAR | Status: DC | PRN
  Administered 2024-05-21 (×2): 2 mg via INTRAVENOUS
  Administered 2024-05-21: 1 mg via INTRAVENOUS

## 2024-05-21 MED ORDER — CHLORHEXIDINE GLUCONATE CLOTH 2 % EX PADS
6.0000 | MEDICATED_PAD | Freq: Every day | CUTANEOUS | Status: DC
  Administered 2024-05-22 – 2024-05-26 (×5): 6 via TOPICAL

## 2024-05-21 MED ORDER — EPHEDRINE 5 MG/ML INJ
INTRAVENOUS | Status: AC
  Filled 2024-05-21: qty 5

## 2024-05-21 MED ORDER — CHLORHEXIDINE GLUCONATE 4 % EX SOLN: 30.0000 mL | CUTANEOUS | Status: DC

## 2024-05-21 MED ORDER — ROCURONIUM BROMIDE 10 MG/ML (PF) SYRINGE
PREFILLED_SYRINGE | INTRAVENOUS | Status: AC
  Filled 2024-05-21: qty 20

## 2024-05-21 MED ORDER — EPHEDRINE SULFATE-NACL 50-0.9 MG/10ML-% IV SOSY
PREFILLED_SYRINGE | INTRAVENOUS | Status: DC | PRN
  Administered 2024-05-21 (×3): 5 mg via INTRAVENOUS

## 2024-05-21 MED ORDER — METOPROLOL TARTRATE 5 MG/5ML IV SOLN: 2.5000 mg | INTRAVENOUS | Status: DC | PRN

## 2024-05-21 MED ORDER — HEPARIN SODIUM (PORCINE) 1000 UNIT/ML IJ SOLN
INTRAMUSCULAR | Status: AC
  Filled 2024-05-21: qty 10

## 2024-05-21 MED ORDER — GLYCOPYRROLATE PF 0.2 MG/ML IJ SOSY
PREFILLED_SYRINGE | INTRAMUSCULAR | Status: DC | PRN
  Administered 2024-05-21: .1 mg via INTRAVENOUS

## 2024-05-21 MED ORDER — TRAMADOL HCL 50 MG PO TABS
50.0000 mg | ORAL_TABLET | ORAL | Status: DC | PRN
  Administered 2024-05-22 – 2024-05-24 (×3): 100 mg via ORAL
  Filled 2024-05-21 (×3): qty 2

## 2024-05-21 MED ORDER — PHENYLEPHRINE 80 MCG/ML (10ML) SYRINGE FOR IV PUSH (FOR BLOOD PRESSURE SUPPORT)
PREFILLED_SYRINGE | INTRAVENOUS | Status: AC
  Filled 2024-05-21: qty 10

## 2024-05-21 MED ORDER — FENTANYL CITRATE (PF) 250 MCG/5ML IJ SOLN
INTRAMUSCULAR | Status: AC
  Filled 2024-05-21: qty 5

## 2024-05-21 MED ORDER — LACTATED RINGERS IV SOLN: INTRAVENOUS | Status: AC

## 2024-05-21 MED ORDER — MAGNESIUM SULFATE 4 GM/100ML IV SOLN
4.0000 g | Freq: Once | INTRAVENOUS | Status: AC
  Administered 2024-05-21: 4 g via INTRAVENOUS
  Filled 2024-05-21: qty 100

## 2024-05-21 MED ORDER — LACTATED RINGERS IV SOLN: INTRAVENOUS | Status: DC | PRN

## 2024-05-21 MED ORDER — ROCURONIUM BROMIDE 100 MG/10ML IV SOLN
INTRAVENOUS | Status: DC | PRN
  Administered 2024-05-21: 30 mg via INTRAVENOUS
  Administered 2024-05-21: 20 mg via INTRAVENOUS
  Administered 2024-05-21: 10 mg via INTRAVENOUS
  Administered 2024-05-21: 100 mg via INTRAVENOUS

## 2024-05-21 MED ORDER — METOPROLOL TARTRATE 12.5 MG HALF TABLET: 12.5000 mg | ORAL_TABLET | Freq: Two times a day (BID) | ORAL | Status: DC

## 2024-05-21 MED ORDER — NICARDIPINE HCL IN NACL 20-0.86 MG/200ML-% IV SOLN
INTRAVENOUS | Status: DC | PRN
  Administered 2024-05-21: 1.5 mg/h via INTRAVENOUS

## 2024-05-21 MED ORDER — NICARDIPINE HCL IN NACL 20-0.86 MG/200ML-% IV SOLN
0.0000 mg/h | INTRAVENOUS | Status: DC
  Administered 2024-05-22: 1 mg/h via INTRAVENOUS
  Filled 2024-05-21: qty 200

## 2024-05-21 MED ORDER — MIDAZOLAM HCL (PF) 10 MG/2ML IJ SOLN
INTRAMUSCULAR | Status: AC
  Filled 2024-05-21: qty 2

## 2024-05-21 MED ORDER — METOPROLOL TARTRATE 12.5 MG HALF TABLET
12.5000 mg | ORAL_TABLET | Freq: Once | ORAL | Status: DC
  Filled 2024-05-21: qty 1

## 2024-05-21 MED ORDER — ~~LOC~~ CARDIAC SURGERY, PATIENT & FAMILY EDUCATION
Freq: Once | Status: DC
  Filled 2024-05-21: qty 1

## 2024-05-21 MED ORDER — NOREPINEPHRINE 4 MG/250ML-% IV SOLN: 0.0000 ug/min | INTRAVENOUS | Status: DC

## 2024-05-21 MED ORDER — DIPHENHYDRAMINE HCL 50 MG/ML IJ SOLN
INTRAMUSCULAR | Status: DC | PRN
  Administered 2024-05-21 (×2): 25 mg via INTRAVENOUS

## 2024-05-21 MED ORDER — ALBUMIN HUMAN 5 % IV SOLN
250.0000 mL | INTRAVENOUS | Status: DC | PRN
  Administered 2024-05-21 (×3): 12.5 g via INTRAVENOUS
  Filled 2024-05-21 (×2): qty 250

## 2024-05-21 MED ORDER — EZETIMIBE 10 MG PO TABS
10.0000 mg | ORAL_TABLET | Freq: Every day | ORAL | Status: DC
Start: 2024-05-22 — End: 2024-05-26
  Administered 2024-05-22 – 2024-05-25 (×4): 10 mg via ORAL
  Filled 2024-05-21 (×4): qty 1

## 2024-05-21 MED ORDER — METOCLOPRAMIDE HCL 5 MG/ML IJ SOLN
10.0000 mg | Freq: Four times a day (QID) | INTRAMUSCULAR | Status: AC
  Administered 2024-05-21 – 2024-05-22 (×6): 10 mg via INTRAVENOUS
  Filled 2024-05-21 (×6): qty 2

## 2024-05-21 MED ORDER — PROPOFOL 10 MG/ML IV BOLUS
INTRAVENOUS | Status: AC
  Filled 2024-05-21: qty 20

## 2024-05-21 MED ORDER — SODIUM CHLORIDE 0.9% FLUSH
3.0000 mL | INTRAVENOUS | Status: DC | PRN
Start: 2024-05-22 — End: 2024-05-24

## 2024-05-21 MED ORDER — LACTATED RINGERS IV SOLN: INTRAVENOUS | Status: DC

## 2024-05-21 MED ORDER — VANCOMYCIN HCL IN DEXTROSE 1-5 GM/200ML-% IV SOLN
1000.0000 mg | Freq: Once | INTRAVENOUS | Status: AC
  Administered 2024-05-21: 1000 mg via INTRAVENOUS
  Filled 2024-05-21: qty 200

## 2024-05-21 MED ORDER — BISACODYL 10 MG RE SUPP: 10.0000 mg | Freq: Every day | RECTAL | Status: DC

## 2024-05-21 MED ORDER — ASPIRIN 325 MG PO TBEC
325.0000 mg | DELAYED_RELEASE_TABLET | Freq: Every day | ORAL | Status: DC
  Administered 2024-05-22 – 2024-05-26 (×5): 325 mg via ORAL
  Filled 2024-05-21 (×5): qty 1

## 2024-05-21 MED ORDER — ONDANSETRON HCL 4 MG/2ML IJ SOLN
INTRAMUSCULAR | Status: AC
  Filled 2024-05-21: qty 2

## 2024-05-21 MED ORDER — ASPIRIN 81 MG PO CHEW: 324.0000 mg | CHEWABLE_TABLET | Freq: Every day | ORAL | Status: DC

## 2024-05-21 MED ORDER — INSULIN ASPART 100 UNIT/ML IJ SOLN
0.0000 [IU] | INTRAMUSCULAR | Status: AC
Start: 2024-05-22 — End: ?
  Administered 2024-05-22 – 2024-05-23 (×3): 2 [IU] via SUBCUTANEOUS

## 2024-05-21 MED ORDER — ACETAMINOPHEN 500 MG PO TABS
1000.0000 mg | ORAL_TABLET | Freq: Once | ORAL | Status: AC
  Administered 2024-05-21: 1000 mg via ORAL
  Filled 2024-05-21: qty 2

## 2024-05-21 MED ORDER — POTASSIUM CHLORIDE 10 MEQ/50ML IV SOLN
10.0000 meq | INTRAVENOUS | Status: AC
  Administered 2024-05-21 (×3): 10 meq via INTRAVENOUS

## 2024-05-21 MED ORDER — PHENYLEPHRINE HCL-NACL 20-0.9 MG/250ML-% IV SOLN
INTRAVENOUS | Status: DC | PRN
  Administered 2024-05-21: 45 ug/min via INTRAVENOUS
  Administered 2024-05-21: 30 ug/min via INTRAVENOUS

## 2024-05-21 MED ORDER — FAMOTIDINE IN NACL 20-0.9 MG/50ML-% IV SOLN
INTRAVENOUS | Status: DC | PRN
  Administered 2024-05-21: 20 mg via INTRAVENOUS

## 2024-05-21 MED ORDER — ASPIRIN 81 MG PO CHEW
324.0000 mg | CHEWABLE_TABLET | Freq: Once | ORAL | Status: AC
  Administered 2024-05-21: 324 mg via ORAL
  Filled 2024-05-21: qty 4

## 2024-05-21 MED ORDER — CEFAZOLIN SODIUM-DEXTROSE 2-4 GM/100ML-% IV SOLN
2.0000 g | Freq: Three times a day (TID) | INTRAVENOUS | Status: AC
  Administered 2024-05-21 – 2024-05-23 (×6): 2 g via INTRAVENOUS
  Filled 2024-05-21 (×6): qty 100

## 2024-05-21 MED ORDER — DEXTROSE 50 % IV SOLN: 0.0000 mL | INTRAVENOUS | Status: DC | PRN

## 2024-05-21 MED ORDER — PHENYLEPHRINE HCL (PRESSORS) 10 MG/ML IV SOLN
INTRAVENOUS | Status: DC | PRN
  Administered 2024-05-21: 80 ug via INTRAVENOUS

## 2024-05-21 MED ORDER — ONDANSETRON HCL 4 MG/2ML IJ SOLN
INTRAMUSCULAR | Status: DC | PRN
  Administered 2024-05-21: 4 mg via INTRAVENOUS

## 2024-05-21 MED ORDER — ORAL CARE MOUTH RINSE: 15.0000 mL | OROMUCOSAL | Status: DC | PRN

## 2024-05-21 MED ORDER — NOREPINEPHRINE BITARTRATE 1 MG/ML IV SOLN
INTRAVENOUS | Status: DC | PRN
  Administered 2024-05-21: 1 mL via INTRAVENOUS
  Administered 2024-05-21 (×2): .5 mL via INTRAVENOUS

## 2024-05-21 MED ORDER — ACETAMINOPHEN 500 MG PO TABS: 1000.0000 mg | ORAL_TABLET | Freq: Four times a day (QID) | ORAL | Status: DC

## 2024-05-21 MED ORDER — ACETAMINOPHEN 160 MG/5ML PO SOLN: 1000.0000 mg | Freq: Four times a day (QID) | ORAL | Status: DC

## 2024-05-21 MED ORDER — ACETAMINOPHEN 500 MG PO TABS
1000.0000 mg | ORAL_TABLET | Freq: Four times a day (QID) | ORAL | Status: DC
  Administered 2024-05-21 – 2024-05-22 (×4): 1000 mg
  Filled 2024-05-21 (×4): qty 2

## 2024-05-21 MED ORDER — SODIUM CHLORIDE 0.9% FLUSH
3.0000 mL | Freq: Two times a day (BID) | INTRAVENOUS | Status: DC
  Administered 2024-05-22 – 2024-05-23 (×4): 3 mL via INTRAVENOUS
  Administered 2024-05-23: 20 mL via INTRAVENOUS

## 2024-05-21 MED ORDER — METOPROLOL TARTRATE 25 MG/10 ML ORAL SUSPENSION: 12.5000 mg | Freq: Two times a day (BID) | ORAL | Status: DC

## 2024-05-21 MED ORDER — CHLORHEXIDINE GLUCONATE 0.12 % MT SOLN
15.0000 mL | OROMUCOSAL | Status: AC
  Administered 2024-05-21: 15 mL via OROMUCOSAL
  Filled 2024-05-21: qty 15

## 2024-05-21 MED ORDER — AMLODIPINE BESYLATE 5 MG PO TABS
5.0000 mg | ORAL_TABLET | Freq: Every day | ORAL | Status: DC
  Administered 2024-05-22: 5 mg via ORAL
  Filled 2024-05-21: qty 1

## 2024-05-21 MED ORDER — ATORVASTATIN CALCIUM 80 MG PO TABS
80.0000 mg | ORAL_TABLET | Freq: Every day | ORAL | Status: DC
  Administered 2024-05-22 – 2024-05-26 (×5): 80 mg via ORAL
  Filled 2024-05-21 (×5): qty 1

## 2024-05-21 MED ORDER — PANTOPRAZOLE SODIUM 40 MG IV SOLR
40.0000 mg | Freq: Every day | INTRAVENOUS | Status: AC
  Administered 2024-05-21 – 2024-05-22 (×2): 40 mg via INTRAVENOUS
  Filled 2024-05-21 (×2): qty 10

## 2024-05-21 MED ORDER — PROPOFOL 10 MG/ML IV BOLUS
INTRAVENOUS | Status: DC | PRN
  Administered 2024-05-21 (×2): 20 mg via INTRAVENOUS
  Administered 2024-05-21: 80 mg via INTRAVENOUS

## 2024-05-21 MED ORDER — FENTANYL CITRATE (PF) 100 MCG/2ML IJ SOLN
INTRAMUSCULAR | Status: DC | PRN
  Administered 2024-05-21: 50 ug via INTRAVENOUS
  Administered 2024-05-21: 100 ug via INTRAVENOUS
  Administered 2024-05-21: 200 ug via INTRAVENOUS
  Administered 2024-05-21: 100 ug via INTRAVENOUS

## 2024-05-21 MED ORDER — PLASMA-LYTE A IV SOLN: INTRAVENOUS | Status: DC | PRN

## 2024-05-21 MED ORDER — DEXMEDETOMIDINE HCL IN NACL 400 MCG/100ML IV SOLN
0.0000 ug/kg/h | INTRAVENOUS | Status: DC
  Administered 2024-05-21: 0.6 ug/kg/h via INTRAVENOUS
  Filled 2024-05-21: qty 100

## 2024-05-21 MED ORDER — OXYCODONE HCL 5 MG PO TABS
5.0000 mg | ORAL_TABLET | ORAL | Status: DC | PRN
  Administered 2024-05-21 (×2): 5 mg via ORAL
  Administered 2024-05-22 – 2024-05-25 (×15): 10 mg via ORAL
  Filled 2024-05-21 (×10): qty 2
  Filled 2024-05-21: qty 1
  Filled 2024-05-21 (×2): qty 2
  Filled 2024-05-21: qty 1
  Filled 2024-05-21 (×3): qty 2

## 2024-05-21 MED ORDER — ACETAMINOPHEN 160 MG/5ML PO SOLN
650.0000 mg | Freq: Once | ORAL | Status: AC
  Administered 2024-05-21: 650 mg
  Filled 2024-05-21: qty 20.3

## 2024-05-21 MED ORDER — HEPARIN SODIUM (PORCINE) 1000 UNIT/ML IJ SOLN
INTRAMUSCULAR | Status: DC | PRN
  Administered 2024-05-21: 3000 [IU] via INTRAVENOUS
  Administered 2024-05-21: 11000 [IU] via INTRAVENOUS

## 2024-05-21 MED ORDER — DOCUSATE SODIUM 100 MG PO CAPS
200.0000 mg | ORAL_CAPSULE | Freq: Every day | ORAL | Status: DC
  Administered 2024-05-22 – 2024-05-25 (×4): 200 mg via ORAL
  Filled 2024-05-21 (×5): qty 2

## 2024-05-21 MED ORDER — SODIUM CHLORIDE 0.9 % IV SOLN: INTRAVENOUS | Status: AC

## 2024-05-21 MED ORDER — MIDAZOLAM HCL (PF) 2 MG/2ML IJ SOLN: 2.0000 mg | INTRAMUSCULAR | Status: DC | PRN

## 2024-05-21 MED ORDER — PROTAMINE SULFATE 10 MG/ML IV SOLN
INTRAVENOUS | Status: DC | PRN
  Administered 2024-05-21: 80 mg via INTRAVENOUS
  Administered 2024-05-21: 20 mg via INTRAVENOUS

## 2024-05-21 MED ORDER — INSULIN REGULAR(HUMAN) IN NACL 100-0.9 UT/100ML-% IV SOLN: INTRAVENOUS | Status: DC

## 2024-05-21 MED ORDER — DIPHENHYDRAMINE HCL 50 MG/ML IJ SOLN
INTRAMUSCULAR | Status: AC
  Filled 2024-05-21: qty 1

## 2024-05-21 MED ORDER — LEVOTHYROXINE SODIUM 100 MCG PO TABS
100.0000 ug | ORAL_TABLET | Freq: Every day | ORAL | Status: DC
  Administered 2024-05-22 – 2024-05-26 (×5): 100 ug via ORAL
  Filled 2024-05-21 (×5): qty 1

## 2024-05-21 MED ORDER — PROTAMINE SULFATE 10 MG/ML IV SOLN
INTRAVENOUS | Status: AC
  Filled 2024-05-21: qty 25

## 2024-05-21 MED ORDER — VASOPRESSIN 20 UNIT/ML IV SOLN
INTRAVENOUS | Status: AC
  Filled 2024-05-21: qty 1

## 2024-05-21 MED ORDER — MORPHINE SULFATE (PF) 2 MG/ML IV SOLN
1.0000 mg | INTRAVENOUS | Status: DC | PRN
  Administered 2024-05-21: 2 mg via INTRAVENOUS
  Administered 2024-05-21: 1 mg via INTRAVENOUS
  Filled 2024-05-21 (×2): qty 1

## 2024-05-21 MED ORDER — ALBUMIN HUMAN 5 % IV SOLN: INTRAVENOUS | Status: DC | PRN

## 2024-05-21 MED ORDER — BISACODYL 5 MG PO TBEC
10.0000 mg | DELAYED_RELEASE_TABLET | Freq: Every day | ORAL | Status: DC
  Administered 2024-05-22 – 2024-05-25 (×4): 10 mg via ORAL
  Filled 2024-05-21 (×5): qty 2

## 2024-05-21 MED ORDER — CHLORHEXIDINE GLUCONATE 0.12 % MT SOLN
15.0000 mL | Freq: Once | OROMUCOSAL | Status: AC
  Administered 2024-05-21: 15 mL via OROMUCOSAL
  Filled 2024-05-21: qty 15

## 2024-05-21 MED ORDER — SODIUM CHLORIDE 0.9 % IV SOLN: 250.0000 mL | INTRAVENOUS | Status: AC

## 2024-05-21 SURGICAL SUPPLY — 66 items
BAG DECANTER FOR FLEXI CONT (MISCELLANEOUS) ×6 IMPLANT
BLADE CLIPPER SURG (BLADE) ×3 IMPLANT
BLADE STERNUM SYSTEM 6 (BLADE) ×3 IMPLANT
BLOWER MISTER CAL-MED (MISCELLANEOUS) ×3 IMPLANT
BNDG ELASTIC 4INX 5YD STR LF (GAUZE/BANDAGES/DRESSINGS) IMPLANT
BNDG GAUZE DERMACEA FLUFF 4 (GAUZE/BANDAGES/DRESSINGS) ×3 IMPLANT
CABLE SURGICAL S-101-97-12 (CABLE) IMPLANT
CANISTER SUCTION 3000ML PPV (SUCTIONS) ×3 IMPLANT
CANNULA VESSEL 3MM BLUNT TIP (CANNULA) IMPLANT
CLIP CORONARY ARTERY RETRACTOR (MISCELLANEOUS) ×3 IMPLANT
CLIP TI MEDIUM 24 (CLIP) IMPLANT
CLIP TI WIDE RED SMALL 24 (CLIP) IMPLANT
DRAIN CHANNEL 19F RND (DRAIN) IMPLANT
DRAPE INCISE IOBAN 66X45 STRL (DRAPES) ×3 IMPLANT
DRAPE SRG 135X102X78XABS (DRAPES) ×3 IMPLANT
DRAPE WARM FLUID 44X44 (DRAPES) ×3 IMPLANT
DRESSING AQUACEL AG SP 3.5X10 (GAUZE/BANDAGES/DRESSINGS) IMPLANT
ELECTRODE REM PT RTRN 9FT ADLT (ELECTROSURGICAL) ×6 IMPLANT
FELT TEFLON 1X6 (MISCELLANEOUS) IMPLANT
GAUZE SPONGE 4X4 12PLY STRL (GAUZE/BANDAGES/DRESSINGS) ×6 IMPLANT
GAUZE SPONGE 4X4 12PLY STRL LF (GAUZE/BANDAGES/DRESSINGS) IMPLANT
GLOVE BIO SURGEON STRL SZ7 (GLOVE) ×6 IMPLANT
GLOVE BIOGEL PI IND STRL 7.5 (GLOVE) ×6 IMPLANT
GOWN STRL REUS W/ TWL LRG LVL3 (GOWN DISPOSABLE) ×12 IMPLANT
GOWN STRL REUS W/ TWL XL LVL3 (GOWN DISPOSABLE) ×6 IMPLANT
GOWN STRL SURGICAL XL XLNG (GOWN DISPOSABLE) ×6 IMPLANT
HEMOSTAT POWDER SURGIFOAM 1G (HEMOSTASIS) ×9 IMPLANT
KIT BASIN OR (CUSTOM PROCEDURE TRAY) ×3 IMPLANT
KIT SUCTION CATH 14FR (SUCTIONS) ×9 IMPLANT
KIT TURNOVER KIT B (KITS) ×3 IMPLANT
LEAD PACING MYOCARDI (MISCELLANEOUS) ×3 IMPLANT
MARKER DISTAL GRAFT W/ HOLDER (MISCELLANEOUS) IMPLANT
MARKER GRAFT CORONARY BYPASS (MISCELLANEOUS) ×9 IMPLANT
OFFPUMP STABILIZER SUV (MISCELLANEOUS) ×3 IMPLANT
PACK OPEN HEART (CUSTOM PROCEDURE TRAY) ×3 IMPLANT
PAD ARMBOARD POSITIONER FOAM (MISCELLANEOUS) ×6 IMPLANT
PAD ELECT DEFIB RADIOL ZOLL (MISCELLANEOUS) ×3 IMPLANT
PENCIL BUTTON HOLSTER BLD 10FT (ELECTRODE) ×3 IMPLANT
POSITIONER ACROBAT-I OFFPUMP (MISCELLANEOUS) ×3 IMPLANT
POSITIONER HEAD DONUT 9IN (MISCELLANEOUS) ×3 IMPLANT
PUNCH AORTIC ROTATE 4.0MM (MISCELLANEOUS) IMPLANT
SOLN 0.9% NACL 1000 ML (IV SOLUTION) ×15 IMPLANT
SOLN 0.9% NACL POUR BTL 1000ML (IV SOLUTION) ×15 IMPLANT
SOLN STERILE WATER 1000 ML (IV SOLUTION) ×6 IMPLANT
SOLN STERILE WATER BTL 1000 ML (IV SOLUTION) ×6 IMPLANT
SUT BONE WAX W31G (SUTURE) ×3 IMPLANT
SUT ETHIBOND 2 0 SH 36X2 (SUTURE) IMPLANT
SUT MNCRL AB 3-0 PS2 18 (SUTURE) ×6 IMPLANT
SUT PDS AB 1 CTX 36 (SUTURE) ×6 IMPLANT
SUT PROLENE 3 0 SH DA (SUTURE) ×3 IMPLANT
SUT PROLENE 4-0 RB1 .5 CRCL 36 (SUTURE) IMPLANT
SUT PROLENE 5 0 C 1 36 (SUTURE) ×9 IMPLANT
SUT PROLENE BLUE 7 0 (SUTURE) ×3 IMPLANT
SUT SILK 1 MH (SUTURE) IMPLANT
SUT SILK 2 0 SH CR/8 (SUTURE) IMPLANT
SUT STEEL STERNAL CCS#1 18IN (SUTURE) IMPLANT
SUT VIC AB 1 CTX36XBRD ANBCTR (SUTURE) IMPLANT
SUT VIC AB 2-0 CT1 TAPERPNT 27 (SUTURE) IMPLANT
SUT VIC AB 2-0 CTX 36 (SUTURE) IMPLANT
SUT VIC AB 3-0 X1 27 (SUTURE) IMPLANT
SYSTEM SAHARA CHEST DRAIN ATS (WOUND CARE) ×3 IMPLANT
TAPES RETRACTO (MISCELLANEOUS) ×6 IMPLANT
TOWEL GREEN STERILE (TOWEL DISPOSABLE) ×3 IMPLANT
TOWEL GREEN STERILE FF (TOWEL DISPOSABLE) ×3 IMPLANT
TRAY FOLEY SLVR 16FR TEMP STAT (SET/KITS/TRAYS/PACK) ×3 IMPLANT
UNDERPAD 30X36 HEAVY ABSORB (UNDERPADS AND DIAPERS) ×3 IMPLANT

## 2024-05-21 NOTE — Progress Notes (Signed)
   [address], [place] [address]             [phone]   S/p OPCAB x 1  BP 117/67   Pulse 70   Temp 97.9 F (36.6 C)   Resp 15   Ht 5' 10 (1.778 m)   Wt 93.5 kg   SpO2 95%   BMI 29.59 kg/m  4L [place] 94% sat CI 2.7   Intake/Output Summary (Last 24 hours) at 05/21/2024 1728 Last data filed at 05/21/2024 1600 Gross per 24 hour  Intake 4690.53 ml  Output 940 ml  Net 3750.53 ml   Minimal CT output K 3.8 Hct 28  Rasheena Talmadge C. Kerrin, MD [facility] [phone]

## 2024-05-21 NOTE — Transfer of Care (Signed)
 Immediate Anesthesia Transfer of Care Note  Patient: Jeffrey Hooper  Procedure(s) Performed: OFF PUMP CORONARY ARTERY BYPASS GRAFTING (CABG) TIMES ONE UTILIZING LEFT RADIAL ARTERY (Chest) ECHOCARDIOGRAM, TRANSESOPHAGEAL, INTRAOPERATIVE SURGICAL PROCUREMENT, ARTERY, RADIAL (Left: Arm Lower)  Patient Location: ICU  Anesthesia Type:General  Level of Consciousness: sedated and Patient remains intubated per anesthesia plan  Airway & Oxygen Therapy: Patient remains intubated per anesthesia plan and Patient placed on Ventilator (see vital sign flow sheet for setting)  Post-op Assessment: Report given to RN and Post -op Vital signs reviewed and stable  Post vital signs: Reviewed and stable  Last Vitals:  Vitals Value Taken Time  BP 102/63   Temp 33.9 C 05/21/24 11:32  Pulse 49 05/21/24 11:32  Resp 17 05/21/24 11:32  SpO2 93 % 05/21/24 11:32  Vitals shown include unfiled device data.  Last Pain:  Vitals:   05/21/24 0612  TempSrc: Oral  PainSc:          Complications: No notable events documented.  Patient transported to ICU with standard monitors (HR, BP, SPO2, RR) and emergency drugs/equipment. Controlled ventilation maintained via ambu bag. Report given to bedside RN, PCCM MD and respiratory therapist. Pt connected to ICU monitor and ventilator. All questions answered and vital signs stable before leaving. Discussed temperature and potential need for [facility] with MD and RN.

## 2024-05-21 NOTE — Consult Note (Signed)
 NAME:  Jeffrey Hooper, MRN:  [id number], DOB:  04/17/1961, LOS: 0 ADMISSION DATE:  05/21/2024, CONSULTATION DATE:  10/16 REFERRING MD:  Shyrl MA CHIEF COMPLAINT:  CABG   History of Present Illness:  63 year old male with past medical history of arthritis, GERD, hypothyroidism, hyperlipidemia, hypertension, CAD with prior stent to LAD 2017 who presents for CABG.   Referred to [facility] from [facility]. Followed by cardiology and having anginal symptoms for abotu 1 year. He had nuclear medicine myoview  study showing RCA infarct in July. This led to [facility] 02/26/24 which showed ostial cx 50%, LAD 30%, RCA 100%. He then had unsuccessful PCI of RCA 03/19/24. He was then referred to [facility].   Off pump CABG 05/21/24   Pertinent  Medical History   arthritis, GERD, hypothyroidism, hyperlipidemia, hypertension, CAD with prior stent to LAD 2017  Significant Hospital Events: Including procedures, antibiotic start and stop dates in addition to other pertinent events   10/16: admit s/p CABG x1  Interim History / Subjective:  Admit icu post-op   Objective   Blood pressure 127/66, pulse (!) 57, temperature 98 F (36.7 C), temperature source Oral, resp. rate 13, height 5' 10 (1.778 m), weight 93.5 kg, SpO2 95%.        Intake/Output Summary (Last 24 hours) at 05/21/2024 1025 Last data filed at 05/21/2024 1016 Gross per 24 hour  Intake 800 ml  Output 375 ml  Net 425 ml   Filed Weights   05/21/24 0612  Weight: 93.5 kg    Examination: General: middle aged male, laying in bed  HENT: ncat, anicteric sclera, perrla, ett, ogt Lungs: clear to auscultation L>R, vented, synchronous Cardiovascular: s1s2, bradycardia ~47, no peripheral edema, mediastinal chest tubes, dressing cdi  Abdomen: rounded, soft  Extremities: no edema, post-op dressing to L wrist  Neuro: sedated GU: foley  Resolved Hospital Problem list    Assessment & Plan:  Multivessel CAD s/p off pump CABG x 1 left radial to PDA  CAD w/  LAD stent 2017, patent  - post-op management per [facility]CTS - CXR/ ABG, CBC, BMET now - con't weaning pressors as able to maintain MAP >70-90. Albumin  boluses prn for low SV.  - tele monitoring/ pacing prn - cardene 1mg /hr  - insulin gtt - wean per endotool  - mediastinal drains per [facility]CTS - multimodal pain control per protocol- oxycodone , tramadol , morphine  with bowel regimen - ASA tonight - metoprolol  BID once off pressors - complete post-op antibiotics - monitor electrolytes, replete PRN   Post-operative vent management  - rapid wean per [facility]CTS protocol  - full mechanical vent support - lung protective ventilation 6-8cc/kg Vt - VAP and PAD bundle in place  - titrate FiO2 to sat goal >92  - maintain peak/plats <30, driving pressures <84    Expected post-operative ABLA Expected post-operative consumptive thrombocytopenia  - trend  - transfuse prn   Hypertension  Hyperlipidemia  On lipitor  80mg  daily, zetia  10mg  daily, ranexa , benicar , imdur , lasix , amlodipine , aspirin   - lipitor  80mg  daily  - asa 324   Pre-diabetes  A1c 5.8  - on insulin gtt peri-operatively  - wean per endotool   HIV testing  Had reactive HIV 4th gen 2020 with subsequent 1/2 negative. Infectious disease with note from today regarding retesting but felt this is false positive. Unclear if there is more recent 4th gen testing that was positive since 2020. Do not see this on chart review  - f/u HIV ab, and RNA quant   Hypothyroidism  - synthroid   100mcg daily   GERD - ppi   Arthritis  - clinically monitor   Depression  - con't home Paxil  20mg  daily   Labs   CBC: Recent Labs  Lab 05/19/24 1115 05/21/24 0806 05/21/24 0810 05/21/24 0931 05/21/24 0934  WBC 8.0  --   --   --   --   HGB 13.8 10.5* 10.9* 9.9* 10.2*  HCT 40.3 31.0* 32.0* 29.0* 30.0*  MCV 91.8  --   --   --   --   PLT 261  --   --   --   --     Basic Metabolic Panel: Recent Labs  Lab 05/19/24 1115 05/21/24 0806 05/21/24 0810  05/21/24 0931 05/21/24 0934  NA 138 141 140 141 141  K 3.6 3.6 3.6 3.6 3.5  CL 104 103  --  105  --   CO2 23  --   --   --   --   GLUCOSE 75 98  --  97  --   BUN 19 16  --  14  --   CREATININE 1.24 1.20  --  1.00  --   CALCIUM  8.9  --   --   --   --    GFR: Estimated Creatinine Clearance: 86.8 mL/min (by C-G formula based on SCr of 1 mg/dL). Recent Labs  Lab 05/19/24 1115  WBC 8.0    Liver Function Tests: Recent Labs  Lab 05/19/24 1115  AST 22  ALT 26  ALKPHOS 85  BILITOT 1.2  PROT 6.5  ALBUMIN  3.8   No results for input(s): LIPASE, AMYLASE in the last 168 hours. No results for input(s): AMMONIA in the last 168 hours.  ABG    Component Value Date/Time   PHART 7.418 05/21/2024 0934   PCO2ART 33.4 05/21/2024 0934   PO2ART 176 (H) 05/21/2024 0934   HCO3 21.5 05/21/2024 0934   TCO2 23 05/21/2024 0934   ACIDBASEDEF 2.0 05/21/2024 0934   O2SAT 100 05/21/2024 0934     Coagulation Profile: Recent Labs  Lab 05/19/24 1115  INR 1.1    Cardiac Enzymes: No results for input(s): CKTOTAL, CKMB, CKMBINDEX, TROPONINI in the last 168 hours.  HbA1C: HB A1C (BAYER DCA - WAIVED)  Date/Time Value Ref Range Status  08/30/2023 09:40 AM 5.7 (H) 4.8 - 5.6 % Final    Comment:             Prediabetes: 5.7 - 6.4          Diabetes: >6.4          Glycemic control for adults with diabetes: <7.0    Hgb A1c MFr Bld  Date/Time Value Ref Range Status  05/19/2024 11:15 AM 5.8 (H) 4.8 - 5.6 % Final    Comment:    (NOTE) Diagnosis of Diabetes The following HbA1c ranges recommended by the American Diabetes Association (ADA) may be used as an aid in the diagnosis of diabetes mellitus.  Hemoglobin             Suggested A1C NGSP%              Diagnosis  <5.7                   Non Diabetic  5.7-6.4                Pre-Diabetic  >6.4                   Diabetic  <7.0  Glycemic control for                       adults with diabetes.     05/18/2015 04:14 PM 5.8 (H) 4.8 - 5.6 % Final    Comment:             Pre-diabetes: 5.7 - 6.4          Diabetes: >6.4          Glycemic control for adults with diabetes: <7.0     CBG: No results for input(s): GLUCAP in the last 168 hours.  Review of Systems:   As above  Past Medical History:  He,  has a past medical history of Allergy , Allergy  to alpha-gal, Arthritis, CAD (coronary artery disease), native coronary artery (12/03/2015), Coronary artery disease, Difficult intubation, Dysrhythmia, GERD (gastroesophageal reflux disease), Head injury with loss of consciousness (HCC), History of hiatal hernia, History of kidney stones, Hyperlipidemia, Hypertension, Hypothyroid, Multiple benign nevi, Neuromuscular disorder (HCC) (2011), Pneumonia (2014, 2017), Prostatism, Pulmonary nodule, and Spinal cord stimulator status.   Surgical History:   Past Surgical History:  Procedure Laterality Date   BACK SURGERY     T12 - L1, 11 times including neck   CARDIAC CATHETERIZATION N/A 12/02/2015   Procedure: Left Heart Cath and Coronary Angiography;  Surgeon: Peter M Swaziland, MD;  Location: [facility] INVASIVE CV LAB;  Service: Cardiovascular;  Laterality: N/A;   CARDIAC CATHETERIZATION N/A 12/02/2015   Procedure: Coronary Stent Intervention;  Surgeon: Peter M Swaziland, MD;  Location: [facility] INVASIVE CV LAB;  Service: Cardiovascular;  Laterality: N/A;  mid LAD Promus 2.5x12   CERVICAL FUSION  2023   C4 and C5   COLONOSCOPY     CORONARY ANGIOPLASTY     one stent placed by Dr. P Swaziland   CORONARY CTO INTERVENTION N/A 03/19/2024   Procedure: CORONARY CTO INTERVENTION;  Surgeon: Swaziland, Peter M, MD;  Location: [facility] INVASIVE CV LAB;  Service: Cardiovascular;  Laterality: N/A;   HARDWARE REMOVAL Right 08/26/2017   Procedure: Right Lumbar Five Revision of pedicle screw with Removal of Lumbar Five Screw;  Surgeon: Colon Shove, MD;  Location: [facility] OR;  Service: Neurosurgery;  Laterality: Right;  posterior   HARDWARE  REMOVAL Right 12/27/2017   Procedure: Right Lumbar Two, Lumbar Three, Lumbar Four Pedicle screw removal with metrex;  Surgeon: Colon Shove, MD;  Location: [facility] OR;  Service: Neurosurgery;  Laterality: Right;  Right L2 to L4 Pedicle screw removal with mterex   HEMORRHOID BANDING     LAPAROSCOPY N/A 09/01/2020   Procedure: LAPAROSCOPY DIAGNOSTIC;  Surgeon: Debby Hila, MD;  Location: [facility] ORS;  Service: General;  Laterality: N/A;   LEFT HEART CATH AND CORONARY ANGIOGRAPHY N/A 02/26/2024   Procedure: LEFT HEART CATH AND CORONARY ANGIOGRAPHY;  Surgeon: Swaziland, Peter M, MD;  Location: [facility] INVASIVE CV LAB;  Service: Cardiovascular;  Laterality: N/A;   MASS EXCISION  06/25/2012   Procedure: EXCISION MASS;  Surgeon: Arley JONELLE Curia, MD;  Location: [facility];  Service: Orthopedics;  Laterality: Left;  transection of NEUROMA, BURYING RADIAL NERVE IN BRACHIORADIALIS LEFT SIDE   radial nerve Left    cut in work injury   RIGHT GREAT TOENAIL REMOVAL  04/2020   SPINAL FUSION     C6-7   TONSILLECTOMY  1982   UMBILICAL HERNIA REPAIR N/A 09/01/2020   Procedure: PRIMARY UMBILICAL HERNIA REPAIR;  Surgeon: Debby Hila, MD;  Location: [facility] ORS;  Service: General;  Laterality: N/A;  VASECTOMY       Social History:   reports that he quit smoking about 17 years ago. His smoking use included cigarettes. He started smoking about 37 years ago. He has a 20 pack-year smoking history. He has never used smokeless tobacco. He reports that he does not currently use alcohol. He reports that he does not use drugs.   Family History:  His family history includes Congestive Heart Failure in his mother; Heart attack in his paternal grandfather and paternal grandmother; Heart attack (age of onset: 37) in his father; Heart failure (age of onset: 28) in his mother. There is no history of Colon cancer, Esophageal cancer, Pancreatic cancer, or Stomach cancer.   Allergies Allergies  Allergen Reactions   Alpha-Gal Rash  and Other (See Comments)    SEVERE ALLERGY  TO ANY MEAT OR MEAT DERIVED PRODUCTS FROM 4 LEGGED ANIMALS > > BEEF, PORK , GOATS, DEER, ETC. < < RESULT OF BITE FROM LONE STAR TICK   Bovine (Beef) Protein Rash and Other (See Comments)   Bovine (Beef) Protein-Containing Drug Products Rash and Other (See Comments)   Lambs Quarters Rash and Other (See Comments)   Porcine (Pork) Protein-Containing Drug Products Rash and Other (See Comments)   Prednisone  Other (See Comments)    im depo medrol  caused dizziness - pt passed out. Ask pt before giving. Pt can take oral prednisone     Brilinta  [Ticagrelor ] Hives    Pt ate pork on the same day he took Brilinta  before knowing he had alpha gal. May have been the alpha gal reaction, pt is unsure.    Gabapentin  Swelling   Lyrica [Pregabalin]     Dry mouth and felt raw   Methylprednisolone  Sodium Succ     im depo medrol  caused dizziness - pt passed out. Ask pt before giving. Issue occurred when administered in the left arm    Penicillins Hives    Has tolerated since   Shellfish Allergy  Hives   Adhesive [Tape] Rash   Doxycycline Hives, Swelling and Rash   Fentanyl  Rash    Reaction to adhesive, not the drug      Home Medications  Prior to Admission medications   Medication Sig Start Date End Date Taking? Authorizing Provider  acetaminophen  (TYLENOL ) 500 MG tablet Take 1,000 mg by mouth every 8 (eight) hours as needed for moderate pain (pain score 4-6).   Yes [provider]  albuterol  (VENTOLIN  HFA) 108 (90 Base) MCG/ACT inhaler TAKE 2 PUFFS BY MOUTH EVERY 6 HOURS AS NEEDED FOR WHEEZE OR SHORTNESS OF BREATH 01/30/23  Yes Gottschalk, Ashly M, DO  amLODipine  (NORVASC ) 5 MG tablet Take 0.5 tablets (2.5 mg total) by mouth 2 (two) times daily. Take 1/2 tablet daily Patient taking differently: Take 2.5 mg by mouth 2 (two) times daily. 04/24/24  Yes Patsey Lot, MD  aspirin  EC 81 MG tablet Take 81 mg by mouth at bedtime.   Yes [provider]   atorvastatin  (LIPITOR ) 80 MG tablet TAKE 1 TABLET BY MOUTH EVERYDAY AT BEDTIME 08/30/23  Yes Gottschalk, Ashly M, DO  cetirizine  (ZYRTEC ) 10 MG tablet Take 10 mg by mouth daily.   Yes [provider]  EPINEPHrine  0.3 mg/0.3 mL IJ SOAJ injection Inject 0.3 mg into the muscle as needed for anaphylaxis. 02/05/24  Yes Lavona Agent, MD  ezetimibe  (ZETIA ) 10 MG tablet Take 1 tablet (10 mg total) by mouth daily. Patient taking differently: Take 10 mg by mouth at bedtime. 04/09/24 07/08/24 Yes Swaziland, Peter M, MD  fluticasone  (FLONASE ) 50 MCG/ACT nasal spray Place 2 sprays into both nostrils daily. Patient taking differently: Place 2 sprays into both nostrils daily as needed for allergies or rhinitis. 06/14/20  Yes Jolinda Potter M, DO  furosemide  (LASIX ) 20 MG tablet Take 1 tablet (20 mg total) by mouth daily. 03/13/24  Yes Lavona Agent, MD  HYDROcodone -acetaminophen  (NORCO/VICODIN) 5-325 MG tablet Take 1 tablet by mouth every 6 (six) hours as needed for severe pain (pain score 7-10) or moderate pain (pain score 4-6). 02/29/24  Yes Ula Prentice SAUNDERS, MD  isosorbide  mononitrate (IMDUR ) 120 MG 24 hr tablet Take 1 tablet (120 mg total) by mouth daily. 03/13/24  Yes Lavona Agent, MD  levothyroxine  (SYNTHROID ) 100 MCG tablet Take 1 tablet (100 mcg total) by mouth daily. 08/30/23  Yes Gottschalk, Potter M, DO  MAGNESIUM  GLYCINATE PO Take 2 tablets by mouth at bedtime.   Yes [provider]  nitroGLYCERIN  (NITROSTAT ) 0.4 MG SL tablet Place 1 tablet (0.4 mg total) under the tongue every 5 (five) minutes as needed for chest pain. 02/05/24  Yes Lavona Agent, MD  olmesartan  (BENICAR ) 40 MG tablet Take 1 tablet (40 mg total) by mouth daily. 12/09/23  Yes Fountain, Madison L, NP  pantoprazole  (PROTONIX ) 40 MG tablet Take 40 mg by mouth 2 (two) times daily. 03/31/24  Yes [provider]  PARoxetine  (PAXIL ) 20 MG tablet Take 1 tablet (20 mg total) by mouth daily. Patient taking differently: Take 20 mg  by mouth at bedtime. 08/30/23  Yes Gottschalk, Potter M, DO  ranolazine  (RANEXA ) 1000 MG SR tablet Take 1 tablet (1,000 mg total) by mouth 2 (two) times daily. 04/09/24  Yes Swazil[place], Peter M, MD  ondansetron  (ZOFRAN -ODT) 4 MG disintegrating tablet TAKE 1 TABLET BY MOUTH EVERY 8 HOURS AS NEEDED FOR NAUSEA AND VOMITING 08/30/23   Jolinda Potter [name], DO     Critical care time: 42   Tinnie FORBES Adolph DEVONNA [facility] 05/21/24 10:41 AM  Please see [email or web address] for pager details.  From 7A-7P if no response, please call [phone] After hours, please call ELink [phone]

## 2024-05-21 NOTE — Anesthesia Procedure Notes (Signed)
 Arterial Line Insertion Start/End10/16/2025 7:00 AM, 05/21/2024 7:10 AM Performed by: Epifanio Fallow, MD, anesthesiologist  Patient location: Pre-op. Preanesthetic checklist: patient identified, IV checked, site marked, risks and benefits discussed, surgical consent, monitors and equipment checked, pre-op evaluation and timeout performed Lidocaine  1% used for infiltration and patient sedated Right, brachial was placed Catheter size: 20 G Maximum sterile barriers used  and Seldinger technique used  Attempts: 1 Procedure performed using ultrasound to evaluate access site. Ultrasound Notes:relevant anatomy identified, ultrasound used to visualize needle entry, vessel patent under ultrasound and image(s) printed for medical record. Following insertion, line sutured, dressing applied and Biopatch. Post procedure assessment: normal  Post procedure complications: second provider assisted. Patient tolerated the procedure well with no immediate complications.

## 2024-05-21 NOTE — Procedures (Signed)
 Extubation Procedure Note  Patient Details:   Name: Jeffrey Hooper DOB: 1960-09-17 MRN: [id number]   Airway Documentation:    Vent end date: 05/21/24 Vent end time: 1615   Evaluation  O2 sats: stable throughout Complications: No apparent complications Patient did tolerate procedure well. Bilateral Breath Sounds: Clear, Diminished   Yes  Patient extubated per rapid wean protocol. Positive cuff leak. NIF -21, VC 1.3L. Vitals are stable on 3L . RN at bedside.  Jeffrey Hooper 05/21/2024, 4:38 PM

## 2024-05-21 NOTE — Anesthesia Postprocedure Evaluation (Signed)
 Anesthesia Post Note  Patient: Jeffrey Hooper  Procedure(s) Performed: OFF PUMP CORONARY ARTERY BYPASS GRAFTING (CABG) TIMES ONE UTILIZING LEFT RADIAL ARTERY (Chest) ECHOCARDIOGRAM, TRANSESOPHAGEAL, INTRAOPERATIVE SURGICAL PROCUREMENT, ARTERY, RADIAL (Left: Arm Lower)     Patient location during evaluation: SICU Anesthesia Type: General Level of consciousness: sedated Pain management: pain level controlled Vital Signs Assessment: post-procedure vital signs reviewed and stable Respiratory status: patient remains intubated per anesthesia plan Cardiovascular status: stable Postop Assessment: no apparent nausea or vomiting Anesthetic complications: no   No notable events documented.  Last Vitals:  Vitals:   05/21/24 0735 05/21/24 1136  BP:    Pulse: (!) 57   Resp:    Temp:    SpO2: 95% 93%    Last Pain:  Vitals:   05/21/24 0612  TempSrc: Oral  PainSc:                  Quitman Norberto,W. EDMOND

## 2024-05-21 NOTE — Progress Notes (Signed)
 Pt referred to our clinic for HIV positive test that I believe was a false positive based in past testing. Recent labs  we had sent to us  were incomplete and we told the patient we could help clarify things by ordering some blood work on him when he was here as an inpatient which I have done

## 2024-05-21 NOTE — Hospital Course (Addendum)
  Referring: Gwenetta Gates* Primary Care: [name], DO Primary Cardiologist:James Lavona, MD   History of Present Illness:    At time of CT surgical evaluation Jeffrey Hooper is a 63 y.o. male who presents for surgical evaluation of coronary artery disease.  He has a history of a CTO lesion to his RCA and has undergone attempted PCI.  This was unsuccessful.  He has a stent to the LAD which is patent.  Echocardiogram reveals preserved biventricular function and no significant valvular disease.  He continues to have exertional shortness of breath and chest tightness.  He also has decreased energy.  Following full review of the patient and relevant studies Dr. Shyrl recommended proceeding with off-pump CABG x 1.  Hospital course:  The patient was admitted electively on 05/21/2024 taken the operating room at which time he underwent CABG x 1 with left radial artery to the PDA.  He tolerated the procedure well was taken to the surgical intensive care unit in stable condition.  Postoperative hospital course:  The patient was weaned from the ventilator without difficulty using standard postcardiac surgical rapid weaning protocol.  Hemodynamics initially showed low cardiac indices and he was treated with Levophed.  This has been able to be weaned without difficulty.  He has been started on amlodipine  for radial artery long-term vasodilator management.  He did develop an early very mild elevation in his creatinine but it has returned to normal.  He had some expected postoperative volume overload not related to congestive failure which is being managed with routine diuresis.  He did develop postoperative atrial fibrillation and has been managed with the amiodarone protocol.

## 2024-05-21 NOTE — Anesthesia Procedure Notes (Addendum)
 Central Venous Catheter Insertion Performed by: Epifanio Fallow, MD, anesthesiologist Start/End10/16/2025 6:45 AM, 05/21/2024 7:00 AM Patient location: Pre-op. Preanesthetic checklist: patient identified, IV checked, site marked, risks and benefits discussed, surgical consent, monitors and equipment checked, pre-op evaluation, timeout performed and anesthesia consent Lidocaine  1% used for infiltration and patient sedated Hand hygiene performed  and maximum sterile barriers used  Catheter size: 8.5 Fr Sheath introducer Procedure performed using ultrasound to evaluate access site. Ultrasound Notes:relevant anatomy identified, ultrasound used to visualize needle entry, vessel patent under ultrasound and image(s) printed for medical record. Attempts: 1 Following insertion, line sutured, dressing applied and Biopatch. Post procedure assessment: blood return through all ports, free fluid flow and no air  Patient tolerated the procedure well with no immediate complications. Additional procedure comments: 3 lumen slick catheter was placed through the [facility] port.SABRA

## 2024-05-21 NOTE — Op Note (Signed)
 [address]       [name] [address]             [phone]                                         05/21/2024    Patient:  Jeffrey Hooper Pre-Op Dx: coronary artery disease HTN HLP DM   Post-op Dx:  same Procedure: Off pump CABG X 1.  Left radial to PDA   Open harvest of the left radial artery   Surgeon and Role:      * Inocencia Murtaugh, Linnie KIDD, MD - Primary    DEWAINE MICAEL Cera, PA-C - assisting  An experienced assistant was required given the complexity of this surgery and the standard of surgical care. The assistant was needed for exposure, dissection, suctioning, retraction of delicate tissues and sutures, instrument exchange and for overall help during this procedure.    Anesthesia  general EBL:  Blood Administration: none  Drains: 79 F blake drain: X 2 mediastinal  Wires: V Counts: correct   Indications: 63 y.o. male with isolated RCA disease.  He has previous PCI to the LAD but the stent is patent.  Echocardiogram reveals preserved biventricular function and no significant valvular disease.  We discussed the risks and benefits of an off-pump CABG x 1.  I think the use of the radial artery along with a small piece of vein would be ideal.  He has had surgery to his left hand in the past and has residual neuropathy.  There is an incision along his thumb and the dorsal portion of his forearm.  I think that a radial harvest should still be possible, but if there is too much scar tissue that we will just take 1 segment of vein.   Findings: Good radial.  Good PDA.  Direct radial to aorta anastomosis  Operative Technique: After the risks, benefits and alternatives were thoroughly discussed, the patient was brought to the operative theatre.  All invasive lines were placed in pre-op holding.  Anesthesia was induced, and the patient was prepped and draped in normal sterile fashion.  An appropriate surgical pause was performed, and pre-operative antibiotics  were dosed accordingly.  We began with simultaneous incisions along the left arm for harvesting of the radial artery and the chest for the sternotomy.  In regards to the sternotomy, this was carried down with bovie cautery, and the sternum was divided with a reciprocating saw.  Meticulous hemostasis was obtained.    The sternal elevator was removed, and a retractor was placed.  The pericardium was divided in the midline and fashioned into a cradle with pericardial stitches.   We next exposed the posterior wall and identified a good target on PDA.  An end to side anastomosis between it and the radial artery.  Meticulous hemostasis was obtained.    A partial occludding clamp was then placed on the ascending aorta, and we created an end to side anastomosis between it and the radial artery.  The proximal site was marked with a ring.  Hemostasis was obtained, and the heparin  was reversed with protamine.  Chest tubes and wires were placed, and the sternum was re-approximated with with sternal wires.  The soft tissue and skin were re-approximated wth absorbable suture.    The patient tolerated the procedure without any immediate complications, and  was transferred to the ICU in guarded condition.  Jeffrey Hooper

## 2024-05-21 NOTE — Interval H&P Note (Signed)
 History and Physical Interval Note:  05/21/2024 7:32 AM  Jeffrey Hooper  has presented today for surgery, with the diagnosis of CAD.  The various methods of treatment have been discussed with the patient and family. After consideration of risks, benefits and other options for treatment, the patient has consented to  Procedure(s): OFF PUMP CORONARY ARTERY BYPASS GRAFTING (CABG) (N/A) ECHOCARDIOGRAM, TRANSESOPHAGEAL, INTRAOPERATIVE (N/A) as a surgical intervention.  The patient's history has been reviewed, patient examined, no change in status, stable for surgery.  I have reviewed the patient's chart and labs.  Questions were answered to the patient's satisfaction.     Racheal Mathurin MALVA Rayas

## 2024-05-21 NOTE — Progress Notes (Signed)
 PCCM Afternoon Note:  Extubated without incident. Levophed off. Cardene on for radial harvest. CT output is low. Doing well.  Tinnie FORBES Furth, PA-C [facility] 05/21/24 6:12 PM  Please see [email or web address] for pager details.  From 7A-7P if no response, please call [phone] After hours, please call ELink [phone]

## 2024-05-21 NOTE — Anesthesia Procedure Notes (Addendum)
 Procedure Name: Intubation Date/Time: 05/21/2024 7:54 AM  Performed by: Lamar Lucie DASEN, CRNAPre-anesthesia Checklist: Patient identified, Emergency Drugs available, Suction available and Patient being monitored Patient Re-evaluated:Patient Re-evaluated prior to induction Oxygen Delivery Method: Circle system utilized Preoxygenation: Pre-oxygenation with 100% oxygen Induction Type: IV induction Ventilation: Mask ventilation without difficulty, Two handed mask ventilation required and Oral airway inserted - appropriate to patient size Laryngoscope Size: Glidescope and 4 Grade View: Grade I Tube type: Oral Tube size: 8.0 mm Number of attempts: 1 Airway Equipment and Method: Stylet and Oral airway Placement Confirmation: ETT inserted through vocal cords under direct vision, positive ETCO2 and breath sounds checked- equal and bilateral Secured at: 24 cm Tube secured with: Tape Dental Injury: Teeth and Oropharynx as per pre-operative assessment  Difficulty Due To: Difficulty was anticipated, Difficult Airway- due to reduced neck mobility and Difficult Airway- due to anterior larynx

## 2024-05-22 ENCOUNTER — Inpatient Hospital Stay (HOSPITAL_COMMUNITY)

## 2024-05-22 ENCOUNTER — Ambulatory Visit: Admitting: Cardiology

## 2024-05-22 ENCOUNTER — Encounter (HOSPITAL_COMMUNITY): Payer: Self-pay | Admitting: Thoracic Surgery (Cardiothoracic Vascular Surgery)

## 2024-05-22 DIAGNOSIS — R9431 Abnormal electrocardiogram [ECG] [EKG]: Secondary | ICD-10-CM

## 2024-05-22 LAB — BASIC METABOLIC PANEL WITH GFR
Anion gap: 9 (ref 5–15)
Anion gap: 9 (ref 5–15)
BUN: 9 mg/dL (ref 8–23)
BUN: 12 mg/dL (ref 8–23)
CO2: 21 mmol/L — ABNORMAL LOW (ref 22–32)
CO2: 25 mmol/L (ref 22–32)
Calcium: 8.4 mg/dL — ABNORMAL LOW (ref 8.9–10.3)
Calcium: 8.5 mg/dL — ABNORMAL LOW (ref 8.9–10.3)
Chloride: 107 mmol/L (ref 98–111)
Chloride: 103 mmol/L (ref 98–111)
Creatinine, Ser: 1.2 mg/dL (ref 0.61–1.24)
Creatinine, Ser: 1.24 mg/dL (ref 0.61–1.24)
GFR, Estimated: >60 mL/min (ref >60)
GFR, Estimated: >60 mL/min (ref >60)
Glucose, Bld: 100 mg/dL — ABNORMAL HIGH (ref 70–99)
Glucose, Bld: 120 mg/dL — ABNORMAL HIGH (ref 70–99)
Potassium: 3.9 mmol/L (ref 3.5–5.1)
Potassium: 4.1 mmol/L (ref 3.5–5.1)
Sodium: 137 mmol/L (ref 135–145)
Sodium: 137 mmol/L (ref 135–145)

## 2024-05-22 LAB — CBC
HCT: 32 % — ABNORMAL LOW (ref 39.0–52.0)
HCT: 33.9 % — ABNORMAL LOW (ref 39.0–52.0)
Hemoglobin: 11.1 g/dL — ABNORMAL LOW (ref 13.0–17.0)
Hemoglobin: 11.6 g/dL — ABNORMAL LOW (ref 13.0–17.0)
MCH: 31.3 pg (ref 26.0–34.0)
MCH: 31.5 pg (ref 26.0–34.0)
MCHC: 34.7 g/dL (ref 30.0–36.0)
MCHC: 34.2 g/dL (ref 30.0–36.0)
MCV: 90.1 fL (ref 80.0–100.0)
MCV: 92.1 fL (ref 80.0–100.0)
Platelets: 181 K/uL (ref 150–400)
Platelets: 186 K/uL (ref 150–400)
RBC: 3.55 MIL/uL — ABNORMAL LOW (ref 4.22–5.81)
RBC: 3.68 MIL/uL — ABNORMAL LOW (ref 4.22–5.81)
RDW: 12.4 % (ref 11.5–15.5)
RDW: 12.6 % (ref 11.5–15.5)
WBC: 8.5 K/uL (ref 4.0–10.5)
WBC: 11 K/uL — ABNORMAL HIGH (ref 4.0–10.5)
nRBC: 0 % (ref 0.0–0.2)
nRBC: 0 % (ref 0.0–0.2)

## 2024-05-22 LAB — GLUCOSE, CAPILLARY
Glucose-Capillary: 96 mg/dL (ref 70–99)
Glucose-Capillary: 159 mg/dL — ABNORMAL HIGH (ref 70–99)
Glucose-Capillary: 99 mg/dL (ref 70–99)
Glucose-Capillary: 115 mg/dL — ABNORMAL HIGH (ref 70–99)
Glucose-Capillary: 122 mg/dL — ABNORMAL HIGH (ref 70–99)
Glucose-Capillary: 82 mg/dL (ref 70–99)

## 2024-05-22 LAB — MAGNESIUM
Magnesium: 2.5 mg/dL — ABNORMAL HIGH (ref 1.7–2.4)
Magnesium: 2.6 mg/dL — ABNORMAL HIGH (ref 1.7–2.4)

## 2024-05-22 LAB — HIV ANTIBODY (ROUTINE TESTING W REFLEX): HIV Screen 4th Generation wRfx: REACTIVE — AB

## 2024-05-22 MED ORDER — ACETAMINOPHEN 160 MG/5ML PO SOLN: 1000.0000 mg | Freq: Four times a day (QID) | ORAL | Status: DC

## 2024-05-22 MED ORDER — AMLODIPINE BESYLATE 5 MG PO TABS
2.5000 mg | ORAL_TABLET | Freq: Every day | ORAL | Status: DC
  Administered 2024-05-23 – 2024-05-26 (×4): 2.5 mg via ORAL
  Filled 2024-05-22 (×4): qty 1

## 2024-05-22 MED ORDER — METHOCARBAMOL 500 MG PO TABS
500.0000 mg | ORAL_TABLET | Freq: Three times a day (TID) | ORAL | Status: DC
  Administered 2024-05-22 – 2024-05-26 (×13): 500 mg via ORAL
  Filled 2024-05-22 (×13): qty 1

## 2024-05-22 MED ORDER — GABAPENTIN 100 MG PO CAPS
100.0000 mg | ORAL_CAPSULE | Freq: Three times a day (TID) | ORAL | Status: DC
  Administered 2024-05-22 (×3): 100 mg via ORAL
  Filled 2024-05-22 (×3): qty 1

## 2024-05-22 MED ORDER — PAROXETINE HCL 20 MG PO TABS
20.0000 mg | ORAL_TABLET | Freq: Every day | ORAL | Status: DC
  Administered 2024-05-22 – 2024-05-25 (×4): 20 mg via ORAL
  Filled 2024-05-22 (×5): qty 1

## 2024-05-22 MED ORDER — ACETAMINOPHEN 500 MG PO TABS
1000.0000 mg | ORAL_TABLET | Freq: Four times a day (QID) | ORAL | Status: DC
  Administered 2024-05-23 – 2024-05-26 (×14): 1000 mg via ORAL
  Filled 2024-05-22 (×14): qty 2

## 2024-05-22 MED ORDER — PANTOPRAZOLE SODIUM 40 MG PO TBEC
40.0000 mg | DELAYED_RELEASE_TABLET | Freq: Two times a day (BID) | ORAL | Status: DC
  Administered 2024-05-23 – 2024-05-26 (×7): 40 mg via ORAL
  Filled 2024-05-22 (×7): qty 1

## 2024-05-22 MED ORDER — LORATADINE 10 MG PO TABS
10.0000 mg | ORAL_TABLET | Freq: Every day | ORAL | Status: DC
  Administered 2024-05-22 – 2024-05-26 (×5): 10 mg via ORAL
  Filled 2024-05-22 (×5): qty 1

## 2024-05-22 NOTE — Plan of Care (Signed)

## 2024-05-22 NOTE — Progress Notes (Addendum)
 [address] [place] [employer] [address]             [phone]      1 Day Post-Op Procedure(s) (LRB): OFF PUMP CORONARY ARTERY BYPASS GRAFTING (CABG) TIMES ONE UTILIZING LEFT RADIAL ARTERY (N/A) ECHOCARDIOGRAM, TRANSESOPHAGEAL, INTRAOPERATIVE (N/A) SURGICAL PROCUREMENT, ARTERY, RADIAL (Left)  Total Length of Stay:  LOS: 1 day   Subjective: Some pain but meds helping  Objective: Vital signs in last 24 hours: Temp:  [92.8 F (33.8 C)-100 F (37.8 C)] 99.9 F (37.7 C) (10/17 0700) Pulse Rate:  [42-78] 77 (10/17 0700) Cardiac Rhythm: Normal sinus rhythm;Heart block (10/16 2100) Resp:  [7-20] 16 (10/17 0700) BP: (86-125)/(56-73) 106/64 (10/16 2000) SpO2:  [90 %-99 %] 93 % (10/17 0700) Arterial Line BP: (70-146)/(41-99) 110/59 (10/17 0700) FiO2 (%):  [40 %-50 %] 40 % (10/16 1549) Weight:  [94.5 kg] 94.5 kg (10/17 0545)  Filed Weights   05/21/24 0612 05/22/24 0545  Weight: 93.5 kg 94.5 kg    Weight change: 0.968 kg   Hemodynamic parameters for last 24 hours: CVP:  [0 mmHg-12 mmHg] 9 mmHg CO:  [2.8 L/min-6.7 L/min] 3.5 L/min CI:  [1.3 L/min/m2-3.2 L/min/m2] 1.7 L/min/m2  Intake/Output from previous day: 10/16 0701 - 10/17 0700 In: 6473.7 [P.O.:365; I.V.:3243.2; IV Piggyback:2865.6] Out: 2730 [Urine:2460; Chest Tube:270]  Intake/Output this shift: No intake/output data recorded.  Current Meds: Scheduled Meds:  acetaminophen   1,000 mg Per Tube Q6H   Or   acetaminophen  (TYLENOL ) oral liquid 160 mg/5 mL  1,000 mg Per Tube Q6H   amLODipine   5 mg Oral Daily   aspirin  EC  325 mg Oral Daily   Or   aspirin   324 mg Per Tube Daily   atorvastatin   80 mg Oral Daily   bisacodyl   10 mg Oral Daily   Or   bisacodyl   10 mg Rectal Daily   Chlorhexidine  Gluconate Cloth  6 each Topical Daily   docusate sodium   200 mg Oral Daily   ezetimibe   10 mg Oral QHS   insulin aspart  0-24 Units Subcutaneous Q4H   levothyroxine   100 mcg Oral Daily   metoCLOPramide  (REGLAN) injection  10 mg Intravenous Q6H   metoprolol  tartrate  12.5 mg Per Tube BID   Or   metoprolol  tartrate  12.5 mg Per Tube BID   [START ON 05/23/2024] pantoprazole   40 mg Oral Daily   pantoprazole  (PROTONIX ) IV  40 mg Intravenous QHS   sodium chloride  flush  3 mL Intravenous Q12H   Continuous Infusions:  sodium chloride  10 mL/hr at 05/22/24 0700   sodium chloride      sodium chloride      albumin  human Stopped (05/21/24 1509)    ceFAZolin  (ANCEF ) IV Stopped (05/22/24 0617)   lactated ringers      lactated ringers  20 mL/hr at 05/22/24 0700   niCARDipine 1 mg/hr (05/22/24 0700)   norepinephrine (LEVOPHED) Adult infusion 2 mcg/min (05/22/24 0700)   PRN Meds:.sodium chloride , albumin  human, dextrose , metoprolol  tartrate, morphine  injection, ondansetron  (ZOFRAN ) IV, mouth rinse, oxyCODONE , sodium chloride  flush, traMADol   General appearance: alert, cooperative, and no distress Neurologic: intact Heart: regular rate and rhythm Lungs: clear to auscultation bilaterally Abdomen: benign Extremities: no edema Wound: dressings intact- some drainage around CT  Lab Results: CBC: Recent Labs    05/21/24 1714 05/22/24 0413  WBC 9.2 8.5  HGB 10.1* 11.1*  HCT 29.9* 32.0*  PLT 165 181   BMET:  Recent Labs  05/21/24 1714 05/22/24 0413  NA 138 137  K 4.0 3.9  CL 108 107  CO2 21* 21*  GLUCOSE 102* 100*  BUN 12 9  CREATININE 1.12 1.20  CALCIUM  8.1* 8.4*    CMET: Lab Results  Component Value Date   WBC 8.5 05/22/2024   HGB 11.1 (L) 05/22/2024   HCT 32.0 (L) 05/22/2024   PLT 181 05/22/2024   GLUCOSE 100 (H) 05/22/2024   CHOL 142 12/23/2023   TRIG 133 12/23/2023   HDL 46 12/23/2023   LDLCALC 69 12/23/2023   ALT 26 05/19/2024   AST 22 05/19/2024   NA 137 05/22/2024   K 3.9 05/22/2024   CL 107 05/22/2024   CREATININE 1.20 05/22/2024   BUN 9 05/22/2024   CO2 21 (L) 05/22/2024   TSH 2.800 12/23/2023   PSA 1.0 08/07/2013   INR 1.2 05/21/2024   HGBA1C 5.8 (H)  05/19/2024      PT/INR:  Recent Labs    05/21/24 1151  LABPROT 16.1*  INR 1.2   Radiology: [facility] Chest Port 1 View Result Date: 05/22/2024 CLINICAL DATA:  Status post CABG. EXAM: PORTABLE CHEST 1 VIEW COMPARISON:  05/21/2024 FINDINGS: Interval extubation and NG tube removal. Right IJ central line remains in place. Thoracic drains again noted over the cardiomediastinal silhouette. No evidence for pneumothorax. No pulmonary edema or focal airspace consolidation. Probable atelectasis at the left base with trace left basilar effusion Telemetry leads overlie the chest. IMPRESSION: 1. Interval extubation and NG tube removal. 2. Probable atelectasis at the left base with trace left basilar effusion. Electronically Signed   By: Camellia Candle M.D.   On: 05/22/2024 07:17   DG Chest Port 1 View Result Date: 05/21/2024 EXAM: 1 VIEW(S) XRAY OF THE CHEST 05/21/2024 11:37:00 AM COMPARISON: 05/19/2024 CLINICAL HISTORY: [id number] S/P CABG x 1 [id number]. S/P CABG [id number] S/P CABG x 1 [id number]. S/P CABG FINDINGS: LINES, TUBES AND DEVICES: Endotracheal and nasogastric tubes are in appropriate position. Right internal jugular catheter is noted with tip in the SVC. Left-sided chest tube is noted. LUNGS AND PLEURA: Small left pleural effusion is noted with associated atelectasis. Small right-sided pneumothorax is noted. No focal pulmonary opacity. No pulmonary edema. HEART AND MEDIASTINUM: No acute abnormality of the cardiac and mediastinal silhouettes. BONES AND SOFT TISSUES: No acute osseous abnormality. IMPRESSION: 1. Small right pneumothorax. This report will be called to the ordering provider. 2. Small left pleural effusion with associated atelectasis; left-sided chest tube in place. Electronically signed by: Lynwood Seip MD 05/21/2024 11:59 AM EDT RP Workstation: [id number]     Assessment/Plan: S/P Procedure(s) (LRB): OFF PUMP CORONARY ARTERY BYPASS GRAFTING (CABG) TIMES ONE UTILIZING LEFT RADIAL ARTERY  (N/A) ECHOCARDIOGRAM, TRANSESOPHAGEAL, INTRAOPERATIVE (N/A) SURGICAL PROCUREMENT, ARTERY, RADIAL (Left) POD#1  1 Tmax 100, s BP 86-125, hemodynamics- most recent CI/CO low at 3.5/1.7, NSR Current Gtts- cardene and levo-  d/c cardene when norvasc  initiated for radial harvest  2 O2 sats good on 4 liters 3 good UOP, weight 1 kg>preop, cont spont diuresis for now 4 CT 270 ml 5 EKG - slight IVCD in some leads, NSR, no ischemic changes 6 CXR minor atx 7 creat higher trend but remains in normal range at 1.20 8 K+/Mg++ normal 9 expected ABLA, H/H 11/32- monitor 10 BS normal- not a diabetic 11 routine progression, pulm hygiene    Lemond FORBES Cera PA-C Pager [phone] 05/22/2024 7:40 AM  Agree with above Doing well Will remove pacing wires POD 1 progression  Lala Been O Dianah Pruett

## 2024-05-22 NOTE — Progress Notes (Signed)
 NAME:  Jeffrey Hooper, MRN:  [id number], DOB:  11/14/1960, LOS: 1 ADMISSION DATE:  05/21/2024, CONSULTATION DATE:  10/16 REFERRING MD:  Shyrl MA CHIEF COMPLAINT:  CABG   History of Present Illness:  63 year old male with past medical history of arthritis, GERD, hypothyroidism, hyperlipidemia, hypertension, CAD with prior stent to LAD 2017 who presents for CABG.   Referred to [facility] from [facility]. Followed by cardiology and having anginal symptoms for abotu 1 year. He had nuclear medicine myoview  study showing RCA infarct in July. This led to [facility] 02/26/24 which showed ostial cx 50%, LAD 30%, RCA 100%. He then had unsuccessful PCI of RCA 03/19/24. He was then referred to [facility].   Off pump CABG 05/21/24   Pertinent  Medical History   arthritis, GERD, hypothyroidism, hyperlipidemia, hypertension, CAD with prior stent to LAD 2017  Significant Hospital Events: Including procedures, antibiotic start and stop dates in addition to other pertinent events   10/16: admit s/p CABG x1  Interim History / Subjective:  Patient was successfully extubated per rapid weaning protocol Continue to complain of surgical site chest pain Denies difficulty breathing, nausea and vomiting Came off of vasopressor support  Objective   Blood pressure (!) 113/53, pulse 63, temperature 99.3 F (37.4 C), resp. rate 17, height 5' 10 (1.778 m), weight 94.5 kg, SpO2 92%. CVP:  [0 mmHg-12 mmHg] 10 mmHg CO:  [2.5 L/min-6.7 L/min] 2.5 L/min CI:  [1.2 L/min/m2-3.2 L/min/m2] 1.2 L/min/m2  Vent Mode: PSV;CPAP FiO2 (%):  [40 %-50 %] 40 % Set Rate:  [4 bmp-16 bmp] 4 bmp Vt Set:  [580 mL] 580 mL PEEP:  [5 cmH20] 5 cmH20 Pressure Support:  [10 cmH20] 10 cmH20   Intake/Output Summary (Last 24 hours) at 05/22/2024 1002 Last data filed at 05/22/2024 0900 Gross per 24 hour  Intake 6008.46 ml  Output 2670 ml  Net 3338.46 ml   Filed Weights   05/21/24 0612 05/22/24 0545  Weight: 93.5 kg 94.5 kg    Examination: General:  Middle-aged male, lying on the bed HEENT: [place]/AT, eyes anicteric.  moist mucus membranes Neuro: Alert, awake following commands Chest: Central sternotomy incision looks clean and dry.  Coarse breath sounds, no wheezes or rhonchi.  Pacer wire, mediastinal and chest tube in place Heart: Regular rate and rhythm, no murmurs or gallops Abdomen: Soft, nontender, nondistended, bowel sounds present  Labs and images reviewed  Patient Lines/Drains/Airways Status     Active Line/Drains/Airways     Name Placement date Placement time Site Days   Arterial Line 05/21/24 Right Brachial 05/21/24  0700  Brachial  1   Peripheral IV 05/21/24 18 G Right;Posterior Hand 05/21/24  0645  Hand  1   Urethral Catheter LOIS Stacks, RN Latex;Straight-tip;Temperature probe 16 Fr. 05/21/24  0755  Latex;Straight-tip;Temperature probe  1   Y Chest Tube 1 and 2 1 Medial Mediastinal 19 Fr. 2 Medial Mediastinal 19 Fr. 05/21/24  1035  -- 1   Wound 05/21/24 1025 Surgical Closed Surgical Incision Arm Left 05/21/24  1025  Arm  1   Wound 05/21/24 1025 Surgical Closed Surgical Incision Chest 05/21/24  1025  Chest  1         Resolved Hospital Problem list    Assessment & Plan:  Multivessel coronary artery disease s/p off-pump CABG x 1 with radial artery harvest Continue aspirin  and statin Chest tube management TCTS Chest tube output was 270 cc in last 24 hours Continue multimodality pain management with tramadol , oxycodone  and morphine  Gabapentin  added Closely  monitor chest tube output Started on low-dose amlodipine  2.5 mg for radial artery harvest, off Cardene infusion Holding metoprolol  in the setting of low blood pressure  Acute respiratory insufficiency, postop Patient was successfully extubated per rapid weaning protocol Continue to titrate nasal cannula oxygen Encourage incentive spirometry and ambulation  Hyperlipidemia Continue atorvastatin   Diabetes type 2 Patient hemoglobin A1c is 5.8 Insulin infusion was  transition to sliding scale with CBG was 140-180  Expected perioperative blood loss anemia and thrombocytopenia Platelets counts have normalized Monitor H&H, currently stable around 11  Hypothyroidism  Continue Synthyroid  GERD Resume PPI twice daily  Depression  Continue Paxil   Labs   CBC: Recent Labs  Lab 05/19/24 1115 05/21/24 0806 05/21/24 1151 05/21/24 1152 05/21/24 1606 05/21/24 1712 05/21/24 1714 05/22/24 0413  WBC 8.0  --  8.8  --   --   --  9.2 8.5  HGB 13.8   < > 9.7* 10.2* 8.8* 9.5* 10.1* 11.1*  HCT 40.3   < > 28.4* 30.0* 26.0* 28.0* 29.9* 32.0*  MCV 91.8  --  90.2  --   --   --  90.3 90.1  PLT 261  --  132*  --   --   --  165 181   < > = values in this interval not displayed.    Basic Metabolic Panel: Recent Labs  Lab 05/19/24 1115 05/21/24 0806 05/21/24 0810 05/21/24 0931 05/21/24 0934 05/21/24 1041 05/21/24 1044 05/21/24 1152 05/21/24 1606 05/21/24 1712 05/21/24 1714 05/22/24 0413  NA 138 141   < > 141   < > 140   < > 142 139 140 138 137  K 3.6 3.6   < > 3.6   < > 3.5   < > 3.3* 3.9 3.8 4.0 3.9  CL 104 103  --  105  --  103  --   --   --   --  108 107  CO2 23  --   --   --   --   --   --   --   --   --  21* 21*  GLUCOSE 75 98  --  97  --  124*  --   --   --   --  102* 100*  BUN 19 16  --  14  --  14  --   --   --   --  12 9  CREATININE 1.24 1.20  --  1.00  --  1.10  --   --   --   --  1.12 1.20  CALCIUM  8.9  --   --   --   --   --   --   --   --   --  8.1* 8.4*  MG  --   --   --   --   --   --   --   --   --   --  2.7* 2.5*   < > = values in this interval not displayed.   GFR: Estimated Creatinine Clearance: 72.7 mL/min (by C-G formula based on SCr of 1.2 mg/dL). Recent Labs  Lab 05/19/24 1115 05/21/24 1151 05/21/24 1714 05/22/24 0413  WBC 8.0 8.8 9.2 8.5    Liver Function Tests: Recent Labs  Lab 05/19/24 1115  AST 22  ALT 26  ALKPHOS 85  BILITOT 1.2  PROT 6.5  ALBUMIN  3.8   No results for input(s): LIPASE, AMYLASE  in the last 168 hours. No results for  input(s): AMMONIA in the last 168 hours.  ABG    Component Value Date/Time   PHART 7.352 05/21/2024 1712   PCO2ART 37.8 05/21/2024 1712   PO2ART 96 05/21/2024 1712   HCO3 21.0 05/21/2024 1712   TCO2 22 05/21/2024 1712   ACIDBASEDEF 4.0 (H) 05/21/2024 1712   O2SAT 97 05/21/2024 1712     Coagulation Profile: Recent Labs  Lab 05/19/24 1115 05/21/24 1151  INR 1.1 1.2    Cardiac Enzymes: No results for input(s): CKTOTAL, CKMB, CKMBINDEX, TROPONINI in the last 168 hours.  HbA1C: HB A1C (BAYER DCA - WAIVED)  Date/Time Value Ref Range Status  08/30/2023 09:40 AM 5.7 (H) 4.8 - 5.6 % Final    Comment:             Prediabetes: 5.7 - 6.4          Diabetes: >6.4          Glycemic control for adults with diabetes: <7.0    Hgb A1c MFr Bld  Date/Time Value Ref Range Status  05/19/2024 11:15 AM 5.8 (H) 4.8 - 5.6 % Final    Comment:    (NOTE) Diagnosis of Diabetes The following HbA1c ranges recommended by the American Diabetes Association (ADA) may be used as an aid in the diagnosis of diabetes mellitus.  Hemoglobin             Suggested A1C NGSP%              Diagnosis  <5.7                   Non Diabetic  5.7-6.4                Pre-Diabetic  >6.4                   Diabetic  <7.0                   Glycemic control for                       adults with diabetes.    05/18/2015 04:14 PM 5.8 (H) 4.8 - 5.6 % Final    Comment:             Pre-diabetes: 5.7 - 6.4          Diabetes: >6.4          Glycemic control for adults with diabetes: <7.0     CBG: Recent Labs  Lab 05/21/24 2114 05/21/24 2213 05/21/24 2313 05/22/24 0430 05/22/24 0810  GLUCAP 102* 97 89 96 159*      Valinda Novas, MD [facility] See Amion for pager If no response to pager, please call [phone] until 7pm After 7pm, Please call E-link [phone]

## 2024-05-22 NOTE — Evaluation (Signed)
 Physical Therapy Evaluation Patient Details Name: Jeffrey Hooper MRN: [id number] DOB: 14-Aug-1960 Today's Date: 05/22/2024  History of Present Illness  63 year old male admitted 10/16 for CABG x 1.  PMH: arthritis, GERD, hypothyroidism, hyperlipidemia, hypertension, CAD with prior stent to LAD 2017  Clinical Impression  Pt admitted with above diagnosis. Pt was able to ambulate with nursing just prior to PT arrival with pt ambulating with Elyn walker with min assist. Pt should progress well as his strength is good.  Educated pt and wife regarding sternal precautions and endurance. Pt also performed exercises.  Will follow acutely and progress pt as able. Will likely only need Cardiac rehab f/u and prefers it at [facility] if available.  Pt currently with functional limitations due to the deficits listed below (see PT Problem List). Pt will benefit from acute skilled PT to increase their independence and safety with mobility to allow discharge.         If plan is discharge home, recommend the following: Assistance with cooking/housework;Assist for transportation;Help with stairs or ramp for entrance   Can travel by private vehicle        Equipment Recommendations BSC/3in1  Recommendations for Other Services       Functional Status Assessment Patient has had a recent decline in their functional status and demonstrates the ability to make significant improvements in function in a reasonable and predictable amount of time.     Precautions / Restrictions Precautions Precautions: Fall;Sternal Precaution Booklet Issued: Yes (comment) Recall of Precautions/Restrictions: Intact Restrictions Weight Bearing Restrictions Per Provider Order: Yes Other Position/Activity Restrictions: sternal      Mobility  Bed Mobility Overal bed mobility: Needs Assistance Bed Mobility: Supine to Sit     Supine to sit: Min assist     General bed mobility comments: cues for sternal precautions, discussed  handout.    Transfers Overall transfer level: Needs assistance Equipment used: None Transfers: Sit to/from Stand Sit to Stand: Min assist           General transfer comment: min assist for power up with cues for hand placement to EVa walker    Ambulation/Gait               General Gait Details: Pt had walked with Elyn walker with nurse and had just returned to room.  Stairs            Wheelchair Mobility     Tilt Bed    Modified Rankin (Stroke Patients Only)       Balance Overall balance assessment: Needs assistance Sitting-balance support: No upper extremity supported, Feet supported Sitting balance-Leahy Scale: Fair     Standing balance support: Bilateral upper extremity supported, During functional activity, Reliant on assistive device for balance Standing balance-Leahy Scale: Poor Standing balance comment: relies on Eva walker                             Pertinent Vitals/Pain Pain Assessment Pain Assessment: Faces Faces Pain Scale: Hurts even more Pain Location: chest, neck and back Pain Descriptors / Indicators: Aching, Discomfort, Grimacing, Guarding Pain Intervention(s): Limited activity within patient's tolerance, Monitored during session, Repositioned    Home Living Family/patient expects to be discharged to:: Private residence Living Arrangements: Spouse/significant other;Children Available Help at Discharge: Family;Available PRN/intermittently Type of Home: House Home Access: Stairs to enter Entrance Stairs-Rails: None Entrance Stairs-Number of Steps: 1   Home Layout: One level;Laundry or work area in [employer]:  Rolling Walker (2 wheels);Shower seat - built in;Hand held Engineer, civil (consulting) - single point      Prior Function Prior Level of Function : Independent/Modified Independent;Driving                     Extremity/Trunk Assessment   Upper Extremity Assessment Upper Extremity Assessment:  Defer to OT evaluation    Lower Extremity Assessment Lower Extremity Assessment: Overall WFL for tasks assessed    Cervical / Trunk Assessment Cervical / Trunk Assessment: Normal  Communication   Communication Communication: No apparent difficulties    Cognition Arousal: Alert Behavior During Therapy: WFL for tasks assessed/performed   PT - Cognitive impairments: No apparent impairments                         Following commands: Intact       Cueing Cueing Techniques: Verbal cues, Tactile cues     General Comments General comments (skin integrity, edema, etc.): 65 bpm, 94%2LO2, 93/58    Exercises General Exercises - Lower Extremity Ankle Circles/Pumps: AROM, Both, 10 reps, Supine Long Arc Quad: AROM, Both, 10 reps, Seated Hip Flexion/Marching: AROM, Both, 10 reps, Seated   Assessment/Plan    PT Assessment Patient needs continued PT services  PT Problem List Decreased activity tolerance;Decreased balance;Decreased mobility;Decreased knowledge of use of DME;Decreased safety awareness;Decreased knowledge of precautions;Cardiopulmonary status limiting activity;Pain       PT Treatment Interventions DME instruction;Gait training;Functional mobility training;Therapeutic activities;Therapeutic exercise;Balance training;Patient/family education;Stair training    PT Goals (Current goals can be found in the Care Plan section)  Acute Rehab PT Goals Patient Stated Goal: to go home PT Goal Formulation: With patient Time For Goal Achievement: 06/05/24 Potential to Achieve Goals: Good    Frequency Min 2X/week     Co-evaluation               AM-PAC PT 6 Clicks Mobility  Outcome Measure Help needed turning from your back to your side while in a flat bed without using bedrails?: A Little Help needed moving from lying on your back to sitting on the side of a flat bed without using bedrails?: A Little Help needed moving to and from a bed to a chair (including  a wheelchair)?: A Little Help needed standing up from a chair using your arms (e.g., wheelchair or bedside chair)?: A Little Help needed to walk in hospital room?: A Little Help needed climbing 3-5 steps with a railing? : A Little 6 Click Score: 18    End of Session Equipment Utilized During Treatment: Gait belt Activity Tolerance: Patient limited by fatigue Patient left: in chair;with call bell/phone within reach;with chair alarm set;with family/visitor present Nurse Communication: Mobility status PT Visit Diagnosis: Muscle weakness (generalized) (M62.81)    Time: 8581-8547 PT Time Calculation (min) (ACUTE ONLY): 34 min   Charges:   PT Evaluation $PT Eval Moderate Complexity: 1 Mod PT Treatments $Therapeutic Exercise: 8-22 mins PT General Charges $$ ACUTE PT VISIT: 1 Visit         Darlyn Repsher M,PT Acute Rehab Services [phone]   Stephane JULIANNA Bevel 05/22/2024, 4:50 PM

## 2024-05-23 ENCOUNTER — Inpatient Hospital Stay (HOSPITAL_COMMUNITY)

## 2024-05-23 DIAGNOSIS — I4891 Unspecified atrial fibrillation: Secondary | ICD-10-CM

## 2024-05-23 LAB — BASIC METABOLIC PANEL WITH GFR
Anion gap: 15 (ref 5–15)
BUN: 11 mg/dL (ref 8–23)
CO2: 21 mmol/L — ABNORMAL LOW (ref 22–32)
Calcium: 8.7 mg/dL — ABNORMAL LOW (ref 8.9–10.3)
Chloride: 102 mmol/L (ref 98–111)
Creatinine, Ser: 1.27 mg/dL — ABNORMAL HIGH (ref 0.61–1.24)
GFR, Estimated: >60 mL/min (ref >60)
Glucose, Bld: 119 mg/dL — ABNORMAL HIGH (ref 70–99)
Potassium: 3.8 mmol/L (ref 3.5–5.1)
Sodium: 138 mmol/L (ref 135–145)

## 2024-05-23 LAB — HIV-1 RNA QUANT-NO REFLEX-BLD
HIV 1 RNA Quant: <20 {copies}/mL
LOG10 HIV-1 RNA: UNDETERMINED {Log_copies}/mL

## 2024-05-23 LAB — GLUCOSE, CAPILLARY
Glucose-Capillary: 81 mg/dL (ref 70–99)
Glucose-Capillary: 144 mg/dL — ABNORMAL HIGH (ref 70–99)
Glucose-Capillary: 145 mg/dL — ABNORMAL HIGH (ref 70–99)
Glucose-Capillary: 102 mg/dL — ABNORMAL HIGH (ref 70–99)
Glucose-Capillary: 141 mg/dL — ABNORMAL HIGH (ref 70–99)

## 2024-05-23 LAB — HEPATIC FUNCTION PANEL
ALT: 13 U/L (ref 0–44)
AST: 25 U/L (ref 15–41)
Albumin: 3.6 g/dL (ref 3.5–5.0)
Alkaline Phosphatase: 67 U/L (ref 38–126)
Bilirubin, Direct: 0.2 mg/dL (ref 0.0–0.2)
Indirect Bilirubin: 1 mg/dL — ABNORMAL HIGH (ref 0.3–0.9)
Total Bilirubin: 1.2 mg/dL (ref 0.0–1.2)
Total Protein: 6 g/dL — ABNORMAL LOW (ref 6.5–8.1)

## 2024-05-23 LAB — CBC
HCT: 36.8 % — ABNORMAL LOW (ref 39.0–52.0)
Hemoglobin: 12.5 g/dL — ABNORMAL LOW (ref 13.0–17.0)
MCH: 31.6 pg (ref 26.0–34.0)
MCHC: 34 g/dL (ref 30.0–36.0)
MCV: 92.9 fL (ref 80.0–100.0)
Platelets: 188 K/uL (ref 150–400)
RBC: 3.96 MIL/uL — ABNORMAL LOW (ref 4.22–5.81)
RDW: 12.7 % (ref 11.5–15.5)
WBC: 13.9 K/uL — ABNORMAL HIGH (ref 4.0–10.5)
nRBC: 0 % (ref 0.0–0.2)

## 2024-05-23 LAB — TSH: TSH: 4.015 u[IU]/mL (ref 0.350–4.500)

## 2024-05-23 MED ORDER — AMIODARONE HCL IN DEXTROSE 360-4.14 MG/200ML-% IV SOLN
30.0000 mg/h | INTRAVENOUS | Status: DC
  Administered 2024-05-23 – 2024-05-24 (×2): 30 mg/h via INTRAVENOUS
  Filled 2024-05-23 (×3): qty 200

## 2024-05-23 MED ORDER — ~~LOC~~ CARDIAC SURGERY, PATIENT & FAMILY EDUCATION: Freq: Once | Status: AC

## 2024-05-23 MED ORDER — AMIODARONE IV BOLUS ONLY 150 MG/100ML
150.0000 mg | Freq: Once | INTRAVENOUS | Status: AC
  Administered 2024-05-23: 150 mg via INTRAVENOUS

## 2024-05-23 MED ORDER — GABAPENTIN 100 MG PO CAPS
100.0000 mg | ORAL_CAPSULE | Freq: Three times a day (TID) | ORAL | Status: AC
  Administered 2024-05-23 – 2024-05-24 (×4): 100 mg via ORAL
  Filled 2024-05-23 (×4): qty 1

## 2024-05-23 MED ORDER — AMIODARONE HCL IN DEXTROSE 360-4.14 MG/200ML-% IV SOLN
60.0000 mg/h | INTRAVENOUS | Status: AC
Start: 2024-05-23 — End: 2024-05-23
  Administered 2024-05-23 (×2): 60 mg/h via INTRAVENOUS
  Filled 2024-05-23 (×2): qty 200

## 2024-05-23 MED ORDER — INSULIN ASPART 100 UNIT/ML IJ SOLN
0.0000 [IU] | Freq: Three times a day (TID) | INTRAMUSCULAR | Status: DC
  Administered 2024-05-23 – 2024-05-25 (×6): 2 [IU] via SUBCUTANEOUS

## 2024-05-23 MED ORDER — SODIUM CHLORIDE 0.9% FLUSH
3.0000 mL | Freq: Two times a day (BID) | INTRAVENOUS | Status: DC
  Administered 2024-05-23: 10 mL via INTRAVENOUS
  Administered 2024-05-23: 3 mL via INTRAVENOUS

## 2024-05-23 MED ORDER — SODIUM CHLORIDE 0.9% FLUSH: 3.0000 mL | INTRAVENOUS | Status: DC | PRN

## 2024-05-23 MED ORDER — METOPROLOL TARTRATE 12.5 MG HALF TABLET
12.5000 mg | ORAL_TABLET | Freq: Two times a day (BID) | ORAL | Status: DC
Start: 2024-05-23 — End: 2024-05-26
  Administered 2024-05-23 – 2024-05-26 (×7): 12.5 mg via ORAL
  Filled 2024-05-23 (×7): qty 1

## 2024-05-23 MED ORDER — AMIODARONE LOAD VIA INFUSION
150.0000 mg | Freq: Once | INTRAVENOUS | Status: AC
  Administered 2024-05-23: 150 mg via INTRAVENOUS
  Filled 2024-05-23: qty 83.34

## 2024-05-23 MED ORDER — SODIUM CHLORIDE 0.9 % IV SOLN: 250.0000 mL | INTRAVENOUS | Status: DC | PRN

## 2024-05-23 NOTE — Progress Notes (Signed)
 NAME:  Jeffrey Hooper, MRN:  [id number], DOB:  March 29, 1961, LOS: 2 ADMISSION DATE:  05/21/2024, CONSULTATION DATE:  10/16 REFERRING MD:  Shyrl MA CHIEF COMPLAINT:  CABG   History of Present Illness:  63 year old male with past medical history of arthritis, GERD, hypothyroidism, hyperlipidemia, hypertension, CAD with prior stent to LAD 2017 who presents for CABG.   Referred to TCTS from [facility]. Followed by cardiology and having anginal symptoms for abotu 1 year. He had nuclear medicine myoview  study showing RCA infarct in July. This led to [facility] 02/26/24 which showed ostial cx 50%, LAD 30%, RCA 100%. He then had unsuccessful PCI of RCA 03/19/24. He was then referred to TCTS.   Off pump CABG 05/21/24   Pertinent  Medical History   arthritis, GERD, hypothyroidism, hyperlipidemia, hypertension, CAD with prior stent to LAD 2017  Significant Hospital Events: Including procedures, antibiotic start and stop dates in addition to other pertinent events   10/16: admit s/p CABG x1, extubated per respiratory protocol 10/17 continue to complain of surgical site pain, came off of vasopressor support  Interim History / Subjective:  Overnight went into A-fib with RVR with heart rate ranging between 110-120, given amiodarone bolus and was started on amiodarone infusion Stated pain is controlled He is leaking from chest tube insertion site He walked in the hallway multiple times  Objective   Blood pressure 99/66, pulse (!) 119, temperature 98.3 F (36.8 C), temperature source Oral, resp. rate 19, height 5' 10 (1.778 m), weight 92.6 kg, SpO2 93%. CVP:  [7 mmHg-8 mmHg] 7 mmHg CO:  [4 L/min-5 L/min] 4.1 L/min CI:  [1.9 L/min/m2-2.3 L/min/m2] 1.9 L/min/m2      Intake/Output Summary (Last 24 hours) at 05/23/2024 0943 Last data filed at 05/23/2024 0700 Gross per 24 hour  Intake 598.55 ml  Output 530 ml  Net 68.55 ml   Filed Weights   05/21/24 0612 05/22/24 0545 05/23/24 0500  Weight: 93.5 kg  94.5 kg 92.6 kg    Examination: General: Middle-age male, lying on the bed HEENT: [place]/AT, eyes anicteric.  moist mucus membranes Neuro: Alert, awake following commands Chest: Central sternotomy incision looks clean and dry, coarse breath sounds, no wheezes or rhonchi.  Mediastinal and chest tube in place, chest tube insertion site shows serosanguineous leak Heart: Irregularly irregular, tachycardic Abdomen: Soft, nontender, nondistended, bowel sounds present  Labs, telemetry and x-ray chest reviewed  Patient Lines/Drains/Airways Status     Active Line/Drains/Airways     Name Placement date Placement time Site Days   Peripheral IV 05/21/24 18 G Right;Posterior Hand 05/21/24  0645  Hand  2   Y Chest Tube 1 and 2 1 Medial Mediastinal 19 Fr. 2 Medial Mediastinal 19 Fr. 05/21/24  1035  -- 2   Wound 05/21/24 1025 Surgical Closed Surgical Incision Arm Left 05/21/24  1025  Arm  2   Wound 05/21/24 1025 Surgical Closed Surgical Incision Chest 05/21/24  1025  Chest  2        Resolved Hospital Problem list    Assessment & Plan:  Multivessel coronary artery disease s/p off-pump CABG x 1 with radial artery harvest Continue aspirin  and statin Chest tube management TCTS Chest tube output was 70 cc in last 24 hours Continue multimodal pain management Continue gabapentin  for 4 more doses and then stop Continue amlodipine  for radial artery harvest  Acute respiratory insufficiency, postop Patient is on 2 L nasal cannula oxygen, it was taken off, closely monitor Encourage incentive spirometry and ambulation  Paroxysmal  A-fib with RVR Patient went into A-fib with RVR overnight with heart rate in 120s, was given amiodarone bolus and infusion, he is still in A-fib with heart rate between 100-115 May need metoprolol  Will give him 1 more bolus of amiodarone with 150 mg x 1  Hyperlipidemia Continue atorvastatin   Diabetes type 2 Patient hemoglobin A1c is 5.8 Blood sugars are  controlled Continue sliding scale insulin  Expected perioperative blood loss anemia and thrombocytopenia Hemoglobin stable, no signs of bleeding  Hypothyroidism  Continue Synthyroid  GERD Continue PPI twice daily  Depression  Continue Paxil   Labs   CBC: Recent Labs  Lab 05/21/24 1151 05/21/24 1152 05/21/24 1712 05/21/24 1714 05/22/24 0413 05/22/24 1621 05/23/24 0602  WBC 8.8  --   --  9.2 8.5 11.0* 13.9*  HGB 9.7*   < > 9.5* 10.1* 11.1* 11.6* 12.5*  HCT 28.4*   < > 28.0* 29.9* 32.0* 33.9* 36.8*  MCV 90.2  --   --  90.3 90.1 92.1 92.9  PLT 132*  --   --  165 181 186 188   < > = values in this interval not displayed.    Basic Metabolic Panel: Recent Labs  Lab 05/19/24 1115 05/21/24 0806 05/21/24 1041 05/21/24 1044 05/21/24 1712 05/21/24 1714 05/22/24 0413 05/22/24 1621 05/23/24 0602  NA 138   < > 140   < > 140 138 137 137 138  K 3.6   < > 3.5   < > 3.8 4.0 3.9 4.1 3.8  CL 104   < > 103  --   --  108 107 103 102  CO2 23  --   --   --   --  21* 21* 25 21*  GLUCOSE 75   < > 124*  --   --  102* 100* 120* 119*  BUN 19   < > 14  --   --  12 9 12 11   CREATININE 1.24   < > 1.10  --   --  1.12 1.20 1.24 1.27*  CALCIUM  8.9  --   --   --   --  8.1* 8.4* 8.5* 8.7*  MG  --   --   --   --   --  2.7* 2.5* 2.6*  --    < > = values in this interval not displayed.   GFR: Estimated Creatinine Clearance: 68 mL/min (A) (by C-G formula based on SCr of 1.27 mg/dL (H)). Recent Labs  Lab 05/21/24 1714 05/22/24 0413 05/22/24 1621 05/23/24 0602  WBC 9.2 8.5 11.0* 13.9*    Liver Function Tests: Recent Labs  Lab 05/19/24 1115  AST 22  ALT 26  ALKPHOS 85  BILITOT 1.2  PROT 6.5  ALBUMIN  3.8   No results for input(s): LIPASE, AMYLASE in the last 168 hours. No results for input(s): AMMONIA in the last 168 hours.  ABG    Component Value Date/Time   PHART 7.352 05/21/2024 1712   PCO2ART 37.8 05/21/2024 1712   PO2ART 96 05/21/2024 1712   HCO3 21.0 05/21/2024  1712   TCO2 22 05/21/2024 1712   ACIDBASEDEF 4.0 (H) 05/21/2024 1712   O2SAT 97 05/21/2024 1712     Coagulation Profile: Recent Labs  Lab 05/19/24 1115 05/21/24 1151  INR 1.1 1.2    Cardiac Enzymes: No results for input(s): CKTOTAL, CKMB, CKMBINDEX, TROPONINI in the last 168 hours.  HbA1C: HB A1C (BAYER DCA - WAIVED)  Date/Time Value Ref Range Status  08/30/2023 09:40 AM 5.7 (H)  4.8 - 5.6 % Final    Comment:             Prediabetes: 5.7 - 6.4          Diabetes: >6.4          Glycemic control for adults with diabetes: <7.0    Hgb A1c MFr Bld  Date/Time Value Ref Range Status  05/19/2024 11:15 AM 5.8 (H) 4.8 - 5.6 % Final    Comment:    (NOTE) Diagnosis of Diabetes The following HbA1c ranges recommended by the American Diabetes Association (ADA) may be used as an aid in the diagnosis of diabetes mellitus.  Hemoglobin             Suggested A1C NGSP%              Diagnosis  <5.7                   Non Diabetic  5.7-6.4                Pre-Diabetic  >6.4                   Diabetic  <7.0                   Glycemic control for                       adults with diabetes.    05/18/2015 04:14 PM 5.8 (H) 4.8 - 5.6 % Final    Comment:             Pre-diabetes: 5.7 - 6.4          Diabetes: >6.4          Glycemic control for adults with diabetes: <7.0     CBG: Recent Labs  Lab 05/22/24 1555 05/22/24 1937 05/22/24 2327 05/23/24 0328 05/23/24 0759  GLUCAP 115* 122* 82 81 144*      Valinda Novas, MD [facility] See Amion for pager If no response to pager, please call [phone] until 7pm After 7pm, Please call E-link [phone]

## 2024-05-23 NOTE — Progress Notes (Signed)
      [address]       [name] [address]             [phone]                 2 Days Post-Op Procedure(s) (LRB): OFF PUMP CORONARY ARTERY BYPASS GRAFTING (CABG) TIMES ONE UTILIZING LEFT RADIAL ARTERY (N/A) ECHOCARDIOGRAM, TRANSESOPHAGEAL, INTRAOPERATIVE (N/A) SURGICAL PROCUREMENT, ARTERY, RADIAL (Left)   Events: Afib overnight _______________________________________________________________ Vitals: BP 107/75   Pulse (!) 119   Temp 98.3 F (36.8 C) (Oral)   Resp 19   Ht 5' 10 (1.778 m)   Wt 92.6 kg   SpO2 93%   BMI 29.29 kg/m  Filed Weights   05/21/24 0612 05/22/24 0545 05/23/24 0500  Weight: 93.5 kg 94.5 kg 92.6 kg     - Neuro: alert NAD  - Cardiovascular: afib, tachy  Drips: amio 60.      - Pulm: EWOB    ABG    Component Value Date/Time   PHART 7.352 05/21/2024 1712   PCO2ART 37.8 05/21/2024 1712   PO2ART 96 05/21/2024 1712   HCO3 21.0 05/21/2024 1712   TCO2 22 05/21/2024 1712   ACIDBASEDEF 4.0 (H) 05/21/2024 1712   O2SAT 97 05/21/2024 1712    - Abd: ND - Extremity: warm  .Intake/Output      10/17 0701 10/18 0700 10/18 0701 10/19 0700   [name]     I.V. (mL/kg) 383.3 (4.1)    IV Piggyback 300    Total Intake(mL/kg) 683.3 (7.4)    Urine (mL/kg/hr) 650 (0.3)    Chest Tube 70    Total Output 720    Net -36.7            _______________________________________________________________ Labs:    Latest Ref Rng & Units 05/23/2024    6:02 AM 05/22/2024    4:21 PM 05/22/2024    4:13 AM  CBC  WBC 4.0 - 10.5 K/uL 13.9  11.0  8.5   Hemoglobin 13.0 - 17.0 g/dL 87.4  88.3  88.8   Hematocrit 39.0 - 52.0 % 36.8  33.9  32.0   Platelets 150 - 400 K/uL 188  186  181       Latest Ref Rng & Units 05/23/2024    6:02 AM 05/22/2024    4:21 PM 05/22/2024    4:13 AM  CMP  Glucose 70 - 99 mg/dL 880  879  899   BUN 8 - 23 mg/dL 11  12  9    Creatinine 0.61 - 1.24 mg/dL 8.72  8.75  8.79   Sodium 135 - 145 mmol/L 138  137  137    Potassium 3.5 - 5.1 mmol/L 3.8  4.1  3.9   Chloride 98 - 111 mmol/L 102  103  107   CO2 22 - 32 mmol/L 21  25  21    Calcium  8.9 - 10.3 mg/dL 8.7  8.5  8.4     CXR: -  _______________________________________________________________  Assessment and Plan: POD 2 s/p CABG with radial   Neuro: pain controlled CV: in afib.  On amio.  Restarting BB.   Pulm: IS, ambulation.  Will remove chest tubes Renal: creat up slightly, gentle diuresis GI: on diet Heme: stable ID: [email or web address] Endo: SSI Dispo: continue ICU care   Jeffrey Hooper Jeffrey Hooper 05/23/2024 10:42 AM

## 2024-05-23 NOTE — Plan of Care (Signed)
  Problem: Clinical Measurements: Goal: Ability to maintain clinical measurements within normal limits will improve Outcome: Progressing   Problem: Nutrition: Goal: Adequate nutrition will be maintained Outcome: Progressing   Problem: Elimination: Goal: Will not experience complications related to bowel motility Outcome: Progressing   Problem: Pain Managment: Goal: General experience of comfort will improve and/or be controlled Outcome: Progressing

## 2024-05-24 LAB — BASIC METABOLIC PANEL WITH GFR
Anion gap: 9 (ref 5–15)
BUN: 12 mg/dL (ref 8–23)
CO2: 25 mmol/L (ref 22–32)
Calcium: 8.5 mg/dL — ABNORMAL LOW (ref 8.9–10.3)
Chloride: 105 mmol/L (ref 98–111)
Creatinine, Ser: 1.07 mg/dL (ref 0.61–1.24)
GFR, Estimated: >60 mL/min (ref >60)
Glucose, Bld: 104 mg/dL — ABNORMAL HIGH (ref 70–99)
Potassium: 3.5 mmol/L (ref 3.5–5.1)
Sodium: 139 mmol/L (ref 135–145)

## 2024-05-24 LAB — CBC
HCT: 31.3 % — ABNORMAL LOW (ref 39.0–52.0)
Hemoglobin: 10.6 g/dL — ABNORMAL LOW (ref 13.0–17.0)
MCH: 31.3 pg (ref 26.0–34.0)
MCHC: 33.9 g/dL (ref 30.0–36.0)
MCV: 92.3 fL (ref 80.0–100.0)
Platelets: 147 K/uL — ABNORMAL LOW (ref 150–400)
RBC: 3.39 MIL/uL — ABNORMAL LOW (ref 4.22–5.81)
RDW: 12.9 % (ref 11.5–15.5)
WBC: 9.3 K/uL (ref 4.0–10.5)
nRBC: 0 % (ref 0.0–0.2)

## 2024-05-24 LAB — HIV-1/2 AB - DIFFERENTIATION
HIV 1 Ab: NONREACTIVE
HIV 2 Ab: NONREACTIVE
Note: NEGATIVE

## 2024-05-24 LAB — HIV-1/HIV-2 QUALITATIVE RNA
Final Interpretation: NEGATIVE
HIV-1 RNA, Qualitative: NONREACTIVE
HIV-2 RNA, Qualitative: NONREACTIVE

## 2024-05-24 LAB — GLUCOSE, CAPILLARY
Glucose-Capillary: 117 mg/dL — ABNORMAL HIGH (ref 70–99)
Glucose-Capillary: 104 mg/dL — ABNORMAL HIGH (ref 70–99)
Glucose-Capillary: 122 mg/dL — ABNORMAL HIGH (ref 70–99)
Glucose-Capillary: 121 mg/dL — ABNORMAL HIGH (ref 70–99)

## 2024-05-24 LAB — MAGNESIUM: Magnesium: 2.3 mg/dL (ref 1.7–2.4)

## 2024-05-24 MED ORDER — AMIODARONE HCL 200 MG PO TABS
400.0000 mg | ORAL_TABLET | Freq: Two times a day (BID) | ORAL | Status: DC
  Administered 2024-05-24 – 2024-05-26 (×5): 400 mg via ORAL
  Filled 2024-05-24 (×5): qty 2

## 2024-05-24 MED ORDER — POTASSIUM CHLORIDE CRYS ER 20 MEQ PO TBCR
40.0000 meq | EXTENDED_RELEASE_TABLET | Freq: Every day | ORAL | Status: DC
  Administered 2024-05-25 – 2024-05-26 (×2): 40 meq via ORAL
  Filled 2024-05-24 (×2): qty 2

## 2024-05-24 MED ORDER — POTASSIUM CHLORIDE CRYS ER 20 MEQ PO TBCR: 40.0000 meq | EXTENDED_RELEASE_TABLET | Freq: Every day | ORAL | Status: DC

## 2024-05-24 MED ORDER — FUROSEMIDE 40 MG PO TABS
40.0000 mg | ORAL_TABLET | Freq: Every day | ORAL | Status: DC
  Administered 2024-05-24 – 2024-05-26 (×3): 40 mg via ORAL
  Filled 2024-05-24 (×3): qty 1

## 2024-05-24 MED ORDER — POTASSIUM CHLORIDE CRYS ER 20 MEQ PO TBCR
20.0000 meq | EXTENDED_RELEASE_TABLET | ORAL | Status: AC
  Administered 2024-05-24 (×3): 20 meq via ORAL
  Filled 2024-05-24 (×3): qty 1

## 2024-05-24 MED ORDER — POTASSIUM CHLORIDE CRYS ER 20 MEQ PO TBCR: 40.0000 meq | EXTENDED_RELEASE_TABLET | Freq: Once | ORAL | Status: DC

## 2024-05-24 NOTE — Plan of Care (Signed)
  Problem: Education: Goal: Knowledge of General Education information will improve Description: Including pain rating scale, medication(s)/side effects and non-pharmacologic comfort measures Outcome: Progressing   Problem: Health Behavior/Discharge Planning: Goal: Ability to manage health-related needs will improve Outcome: Progressing   Problem: Clinical Measurements: Goal: Ability to maintain clinical measurements within normal limits will improve Outcome: Progressing Goal: Will remain free from infection Outcome: Progressing Goal: Diagnostic test results will improve Outcome: Progressing Goal: Respiratory complications will improve Outcome: Progressing Goal: Cardiovascular complication will be avoided Outcome: Progressing   Problem: Pain Managment: Goal: General experience of comfort will improve and/or be controlled Outcome: Not Progressing   Problem: Safety: Goal: Ability to remain free from injury will improve Outcome: Progressing

## 2024-05-24 NOTE — Progress Notes (Signed)
 [address]       [place] [address]             [phone]                 3 Days Post-Op Procedure(s) (LRB): OFF PUMP CORONARY ARTERY BYPASS GRAFTING (CABG) TIMES ONE UTILIZING LEFT RADIAL ARTERY (N/A) ECHOCARDIOGRAM, TRANSESOPHAGEAL, INTRAOPERATIVE (N/A) SURGICAL PROCUREMENT, ARTERY, RADIAL (Left)   Events: Converted overnight _______________________________________________________________ Vitals: BP 112/71   Pulse 73   Temp 98.8 F (37.1 C) (Oral)   Resp 20   Ht 5' 10 (1.778 m)   Wt 95 kg   SpO2 94%   BMI 30.05 kg/m  Filed Weights   05/22/24 0545 05/23/24 0500 05/24/24 0500  Weight: 94.5 kg 92.6 kg 95 kg     - Neuro: alert NAD  - Cardiovascular: sinus  Drips: amio 30    - Pulm: EWOB    ABG    Component Value Date/Time   PHART 7.352 05/21/2024 1712   PCO2ART 37.8 05/21/2024 1712   PO2ART 96 05/21/2024 1712   HCO3 21.0 05/21/2024 1712   TCO2 22 05/21/2024 1712   ACIDBASEDEF 4.0 (H) 05/21/2024 1712   O2SAT 97 05/21/2024 1712    - Abd: ND - Extremity: warm  .Intake/Output      10/18 0701 10/19 0700 10/19 0701 10/20 0700   P.O. 240    I.V. (mL/kg) 470.1 (4.9)    IV Piggyback     Total Intake(mL/kg) 710.1 (7.5)    Urine (mL/kg/hr) 1250 (0.5)    Other 650    Chest Tube 0    Total Output 1900    Net -1189.9            _______________________________________________________________ Labs:    Latest Ref Rng & Units 05/24/2024    5:06 AM 05/23/2024    6:02 AM 05/22/2024    4:21 PM  CBC  WBC 4.0 - 10.5 K/uL 9.3  13.9  11.0   Hemoglobin 13.0 - 17.0 g/dL 89.3  87.4  88.3   Hematocrit 39.0 - 52.0 % 31.3  36.8  33.9   Platelets 150 - 400 K/uL 147  188  186       Latest Ref Rng & Units 05/24/2024    5:06 AM 05/23/2024    6:02 AM 05/23/2024    5:00 AM  CMP  Glucose 70 - 99 mg/dL 895  880    BUN 8 - 23 mg/dL 12  11    Creatinine 9.38 - 1.24 mg/dL 8.92  8.72    Sodium 864 - 145 mmol/L 139  138    Potassium 3.5  - 5.1 mmol/L 3.5  3.8    Chloride 98 - 111 mmol/L 105  102    CO2 22 - 32 mmol/L 25  21    Calcium  8.9 - 10.3 mg/dL 8.5  8.7    Total Protein 6.5 - 8.1 g/dL   6.0   Total Bilirubin 0.0 - 1.2 mg/dL   1.2   Alkaline Phos 38 - 126 U/L   67   AST 15 - 41 U/L   25   ALT 0 - 44 U/L   13     CXR: -  _______________________________________________________________  Assessment and Plan: POD 3 s/p CABG with radial   Neuro: pain controlled CV: in afib.  PO amio Pulm: IS, ambulation.  Renal: creat up slightly, gentle diuresis GI: on diet Heme: stable ID: [email or web address] Endo: SSI Dispo:  floor    Jeffrey Hooper 05/24/2024 9:49 AM

## 2024-05-24 NOTE — Progress Notes (Signed)
 NAME:  Jeffrey Hooper, MRN:  [id number], DOB:  11/07/1960, LOS: 3 ADMISSION DATE:  05/21/2024, CONSULTATION DATE:  10/16 REFERRING MD:  Shyrl MA CHIEF COMPLAINT:  CABG   History of Present Illness:  63 year old male with past medical history of arthritis, GERD, hypothyroidism, hyperlipidemia, hypertension, CAD with prior stent to LAD 2017 who presents for CABG.   Referred to TCTS from [facility]. Followed by cardiology and having anginal symptoms for abotu 1 year. He had nuclear medicine myoview  study showing RCA infarct in July. This led to [facility] 02/26/24 which showed ostial cx 50%, LAD 30%, RCA 100%. He then had unsuccessful PCI of RCA 03/19/24. He was then referred to TCTS.   Off pump CABG 05/21/24   Pertinent  Medical History   arthritis, GERD, hypothyroidism, hyperlipidemia, hypertension, CAD with prior stent to LAD 2017  Significant Hospital Events: Including procedures, antibiotic start and stop dates in addition to other pertinent events   10/16: admit s/p CABG x1, extubated per respiratory protocol 10/17 continue to complain of surgical site pain, came off of vasopressor support 10/18 overnight patient went into A-fib with RVR with heart rate ranging between 110-120, he was given amiodarone bolus x 2 and was started on amiodarone infusion, chest tubes were removed  Interim History / Subjective:  Patient converted to sinus rhythm with heart rate in 60s Denies chest pain, abdominal pain, nausea and vomiting No overnight issues  Objective   Blood pressure 112/71, pulse 73, temperature 98.8 F (37.1 C), temperature source Oral, resp. rate 20, height 5' 10 (1.778 m), weight 95 kg, SpO2 94%.        Intake/Output Summary (Last 24 hours) at 05/24/2024 0826 Last data filed at 05/24/2024 0700 Gross per 24 hour  Intake 677.4 ml  Output 1900 ml  Net -1222.6 ml   Filed Weights   05/22/24 0545 05/23/24 0500 05/24/24 0500  Weight: 94.5 kg 92.6 kg 95 kg    Examination: General:  Middle-aged male, sitting on recliner HEENT: [place]/AT, eyes anicteric.  moist mucus membranes.  Right IJ central line in place Neuro: Alert, awake following commands Chest: Central sternotomy incision looks clean and dry, coarse breath sounds, no wheezes or rhonchi Heart: Regular rate and rhythm, no murmurs or gallops Abdomen: Soft, nontender, nondistended, bowel sounds present  Labs reviewed  Patient Lines/Drains/Airways Status     Active Line/Drains/Airways     Name Placement date Placement time Site Days   Peripheral IV 05/21/24 18 G Right;Posterior Hand 05/21/24  0645  Hand  3   Wound 05/21/24 1025 Surgical Closed Surgical Incision Arm Left 05/21/24  1025  Arm  3   Wound 05/21/24 1025 Surgical Closed Surgical Incision Chest 05/21/24  1025  Chest  3         Resolved Hospital Problem list   Acute respiratory insufficiency Assessment & Plan:  Multivessel coronary artery disease s/p off-pump CABG x 1 with radial artery harvest Continue aspirin  and statin Continue low-dose metoprolol , currently at 12.5 mg twice daily Continue multimodal pain management Stop gabapentin  Continue amlodipine  for radial artery harvest  Acute respiratory insufficiency, postop, resolved Patient is on room air  Paroxysmal A-fib with RVR Patient converted to sinus rhythm, now heart rate in 60s On amiodarone at 30 mg/h, probably will switch to p.o. or stop Continue metoprolol  12.5 mg twice daily  Continue telemetry monitoring  Hyperlipidemia Continue atorvastatin   Diabetes type 2 Blood sugars are controlled, continue sliding scale insulin with CBG goal 140-180  Expected perioperative blood loss anemia  and thrombocytopenia Hemoglobin stable, no signs of bleeding Platelet counts are 147  Hypothyroidism  Continue Synthyroid  GERD Continue PPI twice daily  Depression  Continue Paxil   Labs   CBC: Recent Labs  Lab 05/21/24 1714 05/22/24 0413 05/22/24 1621 05/23/24 0602 05/24/24 0506   WBC 9.2 8.5 11.0* 13.9* 9.3  HGB 10.1* 11.1* 11.6* 12.5* 10.6*  HCT 29.9* 32.0* 33.9* 36.8* 31.3*  MCV 90.3 90.1 92.1 92.9 92.3  PLT 165 181 186 188 147*    Basic Metabolic Panel: Recent Labs  Lab 05/21/24 1714 05/22/24 0413 05/22/24 1621 05/23/24 0602 05/24/24 0506  NA 138 137 137 138 139  K 4.0 3.9 4.1 3.8 3.5  CL 108 107 103 102 105  CO2 21* 21* 25 21* 25  GLUCOSE 102* 100* 120* 119* 104*  BUN 12 9 12 11 12   CREATININE 1.12 1.20 1.24 1.27* 1.07  CALCIUM  8.1* 8.4* 8.5* 8.7* 8.5*  MG 2.7* 2.5* 2.6*  --  2.3   GFR: Estimated Creatinine Clearance: 81.8 mL/min (by C-G formula based on SCr of 1.07 mg/dL). Recent Labs  Lab 05/22/24 0413 05/22/24 1621 05/23/24 0602 05/24/24 0506  WBC 8.5 11.0* 13.9* 9.3    Liver Function Tests: Recent Labs  Lab 05/19/24 1115 05/23/24 0500  AST 22 25  ALT 26 13  ALKPHOS 85 67  BILITOT 1.2 1.2  PROT 6.5 6.0*  ALBUMIN  3.8 3.6   No results for input(s): LIPASE, AMYLASE in the last 168 hours. No results for input(s): AMMONIA in the last 168 hours.  ABG    Component Value Date/Time   PHART 7.352 05/21/2024 1712   PCO2ART 37.8 05/21/2024 1712   PO2ART 96 05/21/2024 1712   HCO3 21.0 05/21/2024 1712   TCO2 22 05/21/2024 1712   ACIDBASEDEF 4.0 (H) 05/21/2024 1712   O2SAT 97 05/21/2024 1712     Coagulation Profile: Recent Labs  Lab 05/19/24 1115 05/21/24 1151  INR 1.1 1.2    Cardiac Enzymes: No results for input(s): CKTOTAL, CKMB, CKMBINDEX, TROPONINI in the last 168 hours.  HbA1C: HB A1C (BAYER DCA - WAIVED)  Date/Time Value Ref Range Status  08/30/2023 09:40 AM 5.7 (H) 4.8 - 5.6 % Final    Comment:             Prediabetes: 5.7 - 6.4          Diabetes: >6.4          Glycemic control for adults with diabetes: <7.0    Hgb A1c MFr Bld  Date/Time Value Ref Range Status  05/19/2024 11:15 AM 5.8 (H) 4.8 - 5.6 % Final    Comment:    (NOTE) Diagnosis of Diabetes The following HbA1c ranges recommended  by the American Diabetes Association (ADA) may be used as an aid in the diagnosis of diabetes mellitus.  Hemoglobin             Suggested A1C NGSP%              Diagnosis  <5.7                   Non Diabetic  5.7-6.4                Pre-Diabetic  >6.4                   Diabetic  <7.0                   Glycemic control for  adults with diabetes.    05/18/2015 04:14 PM 5.8 (H) 4.8 - 5.6 % Final    Comment:             Pre-diabetes: 5.7 - 6.4          Diabetes: >6.4          Glycemic control for adults with diabetes: <7.0     CBG: Recent Labs  Lab 05/23/24 0759 05/23/24 1205 05/23/24 1605 05/23/24 2100 05/24/24 0603  GLUCAP 144* 145* 102* 141* 117*      Valinda Novas, MD [facility] See Amion for pager If no response to pager, please call [phone] until 7pm After 7pm, Please call E-link [phone]

## 2024-05-24 NOTE — Plan of Care (Signed)
  Problem: Clinical Measurements: Goal: Ability to maintain clinical measurements within normal limits will improve Outcome: Progressing Goal: Diagnostic test results will improve Outcome: Progressing   Problem: Nutrition: Goal: Adequate nutrition will be maintained Outcome: Progressing   Problem: Elimination: Goal: Will not experience complications related to bowel motility Outcome: Progressing

## 2024-05-25 LAB — GLUCOSE, CAPILLARY
Glucose-Capillary: 99 mg/dL (ref 70–99)
Glucose-Capillary: 142 mg/dL — ABNORMAL HIGH (ref 70–99)
Glucose-Capillary: 98 mg/dL (ref 70–99)
Glucose-Capillary: 130 mg/dL — ABNORMAL HIGH (ref 70–99)

## 2024-05-25 NOTE — Evaluation (Signed)
 Occupational Therapy Evaluation Patient Details Name: Jeffrey Hooper MRN: [id number] DOB: 1961/03/13 Today's Date: 05/25/2024   History of Present Illness   63 year old male admitted 10/16 for CABG x 1.  PMH: arthritis, GERD, hypothyroidism, hyperlipidemia, hypertension, CAD with prior stent to LAD 2017     Clinical Impressions Pt ind at baseline with ADLs/functional mobility, lives with spouse who can provide PRN assist at d/c. Pt currently needs up to min A for ADLs, and supervision for transfers with RW. Pt educated on sternal prec (handout also provided). Pt with good understanding of precautions and able to demo compensatory strategies during session. Pt presenting with impairments listed below, will follow acutely. Anticipate no OT follow up needs at d/c, but would benefit from cardiac rehab once cleared by MD.     If plan is discharge home, recommend the following:   A little help with walking and/or transfers;A little help with bathing/dressing/bathroom;Assistance with cooking/housework;Help with stairs or ramp for entrance;Assist for transportation     Functional Status Assessment   Patient has had a recent decline in their functional status and demonstrates the ability to make significant improvements in function in a reasonable and predictable amount of time.     Equipment Recommendations   None recommended by OT     Recommendations for Other Services   PT consult     Precautions/Restrictions   Precautions Precautions: Fall;Sternal Precaution Booklet Issued: Yes (comment) Recall of Precautions/Restrictions: Intact     Mobility Bed Mobility               General bed mobility comments: OOB in chair    Transfers Overall transfer level: Needs assistance Equipment used: Rolling walker (2 wheels) Transfers: Sit to/from Stand Sit to Stand: Supervision                  Balance Overall balance assessment: Needs assistance Sitting-balance  support: No upper extremity supported, Feet supported Sitting balance-Leahy Scale: Fair     Standing balance support: Bilateral upper extremity supported, During functional activity, Reliant on assistive device for balance Standing balance-Leahy Scale: Fair                             ADL either performed or assessed with clinical judgement   ADL Overall ADL's : Needs assistance/impaired Eating/Feeding: Supervision/ safety;Sitting   Grooming: Supervision/safety;Sitting   Upper Body Bathing: Minimal assistance;Sitting   Lower Body Bathing: Minimal assistance;Sitting/lateral leans   Upper Body Dressing : Minimal assistance;Sitting   Lower Body Dressing: Minimal assistance;Sitting/lateral leans;Sit to/from stand   Toilet Transfer: Supervision/safety;Rolling walker (2 wheels)           Functional mobility during ADLs: Supervision/safety;Rolling walker (2 wheels)       Vision   Vision Assessment?: No apparent visual deficits     Perception Perception: Not tested       Praxis Praxis: Not tested       Pertinent Vitals/Pain Pain Assessment Pain Assessment: No/denies pain     Extremity/Trunk Assessment Upper Extremity Assessment Upper Extremity Assessment: Right hand dominant;LUE deficits/detail LUE Deficits / Details: 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, decr sensation in digits 1-3 LUE Sensation: decreased proprioception;decreased light touch   Lower Extremity Assessment Lower Extremity Assessment: Defer to PT evaluation   Cervical / Trunk Assessment Cervical / Trunk Assessment: Normal   Communication Communication Communication: No apparent difficulties   Cognition Arousal: Alert Behavior During Therapy: WFL for tasks assessed/performed Cognition: No apparent  impairments                               Following commands: Intact       Cueing  General Comments   Cueing Techniques: Verbal cues;Tactile cues  VSS on RA    Exercises     Shoulder Instructions      Home Living Family/patient expects to be discharged to:: Private residence Living Arrangements: Spouse/significant other;Children Available Help at Discharge: Family;Available PRN/intermittently Type of Home: House Home Access: Stairs to enter [employer] of Steps: 1 Entrance Stairs-Rails: None Home Layout: One level;Laundry or work area in basement     [employer] Shower/Tub: Producer, television/film/video: Administrator Accessibility: Yes   Home Equipment: Agricultural consultant (2 wheels);Shower seat - built in;Hand held Engineer, civil (consulting) - single point          Prior Functioning/Environment Prior Level of Function : Independent/Modified Independent;Driving                    OT Problem List: Decreased strength;Decreased range of motion;Decreased activity tolerance;Impaired balance (sitting and/or standing);Decreased coordination;Decreased safety awareness;Decreased cognition   OT Treatment/Interventions: Self-care/ADL training;Therapeutic exercise;Energy conservation;DME and/or AE instruction;Therapeutic activities;Patient/family education;Balance training      OT Goals(Current goals can be found in the care plan section)   Acute Rehab OT Goals Patient Stated Goal: none stated OT Goal Formulation: With patient Time For Goal Achievement: 06/08/24 Potential to Achieve Goals: Good ADL Goals Pt Will Perform Upper Body Dressing: Independently;sitting Pt Will Perform Lower Body Dressing: Independently;sitting/lateral leans;sit to/from stand Pt Will Transfer to Toilet: Independently;ambulating;regular height toilet Pt Will Perform Tub/Shower Transfer: Shower transfer;Independently;ambulating;shower seat Additional ADL Goal #1: pt will perform functional BADL/mobility tasks with min cues for sternal prec throughout.   OT Frequency:  Min 2X/week    Co-evaluation              AM-PAC OT 6 Clicks Daily  Activity     Outcome Measure Help from another person eating meals?: A Little Help from another person taking care of personal grooming?: A Little Help from another person toileting, which includes using toliet, bedpan, or urinal?: A Little Help from another person bathing (including washing, rinsing, drying)?: A Little Help from another person to put on and taking off regular upper body clothing?: A Little Help from another person to put on and taking off regular lower body clothing?: A Little 6 Click Score: 18   End of Session Equipment Utilized During Treatment: Rolling walker (2 wheels) Nurse Communication: Mobility status  Activity Tolerance: Patient tolerated treatment well Patient left: in chair;with call bell/phone within reach  OT Visit Diagnosis: Unsteadiness on feet (R26.81);Other abnormalities of gait and mobility (R26.89);Muscle weakness (generalized) (M62.81)                Time: 1109-1140 OT Time Calculation (min): 31 min Charges:  OT General Charges $OT Visit: 1 Visit OT Evaluation $OT Eval Moderate Complexity: 1 Mod OT Treatments $Self Care/Home Management : 8-22 mins  Jeffrey Hooper, OTD, OTR/L SecureChat Preferred Acute Rehab [phone]   Jeffrey Hooper 05/25/2024, 12:49 PM

## 2024-05-25 NOTE — Progress Notes (Signed)
 4 Days Post-Op Procedure(s) (LRB): OFF PUMP CORONARY ARTERY BYPASS GRAFTING (CABG) TIMES ONE UTILIZING LEFT RADIAL ARTERY (N/A) ECHOCARDIOGRAM, TRANSESOPHAGEAL, INTRAOPERATIVE (N/A) SURGICAL PROCUREMENT, ARTERY, RADIAL (Left) Subjective: Feels pretty well, somewhat weak but improving daily  Objective: Vital signs in last 24 hours: Temp:  [98 F (36.7 C)-99 F (37.2 C)] 98.3 F (36.8 C) (10/20 0438) Pulse Rate:  [61-83] 73 (10/20 0438) Cardiac Rhythm: Normal sinus rhythm (10/19 1957) Resp:  [9-19] 14 (10/20 0438) BP: (94-138)/(61-85) 114/72 (10/20 0438) SpO2:  [93 %-100 %] 97 % (10/20 0438)  Hemodynamic parameters for last 24 hours:    Intake/Output from previous day: 10/19 0701 - 10/20 0700 In: 296.5 [P.O.:240; I.V.:56.5] Out: 1125 [Urine:1125] Intake/Output this shift: No intake/output data recorded.  General appearance: alert, cooperative, and no distress Heart: regular rate and rhythm Lungs: clear to auscultation bilaterally Abdomen: benign Extremities: + minor edema Wound: incis healing well  Lab Results: Recent Labs    05/23/24 0602 05/24/24 0506  WBC 13.9* 9.3  HGB 12.5* 10.6*  HCT 36.8* 31.3*  PLT 188 147*   BMET:  Recent Labs    05/23/24 0602 05/24/24 0506  NA 138 139  K 3.8 3.5  CL 102 105  CO2 21* 25  GLUCOSE 119* 104*  BUN 11 12  CREATININE 1.27* 1.07  CALCIUM  8.7* 8.5*    PT/INR: No results for input(s): LABPROT, INR in the last 72 hours. ABG    Component Value Date/Time   PHART 7.352 05/21/2024 1712   HCO3 21.0 05/21/2024 1712   TCO2 22 05/21/2024 1712   ACIDBASEDEF 4.0 (H) 05/21/2024 1712   O2SAT 97 05/21/2024 1712   CBG (last 3)  Recent Labs    05/24/24 1655 05/24/24 2104 05/25/24 0621  GLUCAP 122* 121* 99    Meds Scheduled Meds:  acetaminophen   1,000 mg Oral Q6H   Or   acetaminophen  (TYLENOL ) oral liquid 160 mg/5 mL  1,000 mg Per Tube Q6H   amiodarone  400 mg Oral BID   amLODipine   2.5 mg Oral Daily   aspirin  EC   325 mg Oral Daily   Or   aspirin   324 mg Per Tube Daily   atorvastatin   80 mg Oral Daily   bisacodyl   10 mg Oral Daily   Or   bisacodyl   10 mg Rectal Daily   Chlorhexidine  Gluconate Cloth  6 each Topical Daily   docusate sodium   200 mg Oral Daily   ezetimibe   10 mg Oral QHS   furosemide   40 mg Oral Daily   insulin aspart  0-24 Units Subcutaneous TID AC & HS   levothyroxine   100 mcg Oral Daily   loratadine   10 mg Oral Daily   methocarbamol   500 mg Oral TID   metoprolol  tartrate  12.5 mg Oral BID   pantoprazole   40 mg Oral BID   PARoxetine   20 mg Oral QHS   potassium chloride   40 mEq Oral Daily   Continuous Infusions:  albumin  human Stopped (05/21/24 1509)   PRN Meds:.albumin  human, dextrose , metoprolol  tartrate, morphine  injection, ondansetron  (ZOFRAN ) IV, mouth rinse, oxyCODONE , traMADol   Xrays No results found.  Assessment/Plan: S/P Procedure(s) (LRB): OFF PUMP CORONARY ARTERY BYPASS GRAFTING (CABG) TIMES ONE UTILIZING LEFT RADIAL ARTERY (N/A) ECHOCARDIOGRAM, TRANSESOPHAGEAL, INTRAOPERATIVE (N/A) SURGICAL PROCUREMENT, ARTERY, RADIAL (Left)  POD#4  1 Tmax 99.4, VSS, NSR, on po amio for post op afib 2 O2 sats good on RA 3 voiding well, weight , weight below preop but will some edema will cont lasix   short term 4 no new labs/CXR 5 cont rehab and pulm hygiene   LOS: 4 days    Lemond FORBES Cera PA-C Pager [phone] 05/25/2024

## 2024-05-25 NOTE — Progress Notes (Signed)
+  BM overnight.

## 2024-05-25 NOTE — Progress Notes (Signed)
 CARDIAC REHAB PHASE I   PRE:  Rate/Rhythm: 73 SR    BP: sitting 93/62    SpO2: 97 RA  MODE:  Ambulation: 470 ft   POST:  Rate/Rhythm: 87 SR    BP: sitting 131/69     SpO2: 95 RA   Pt ambulated with standby assist, no AD. Steady but c/o hip fatigue/pain with distance. Return to chair. Moving well following sternal precautions.   Discussed with pt IS, sternal precautions, diet, exercise, and CRPII. SABRA Pt receptive. Will refer to [facility] CRPII.  [phone]  Aliene Aris BS, ACSM-CEP 05/25/2024 1:42 PM

## 2024-05-25 NOTE — Progress Notes (Signed)
 Physical Therapy Discharge Note Patient Details Name: Jeffrey Hooper MRN: [id number] DOB: May 02, 1961 Today's Date: 05/25/2024   History of Present Illness 63 year old male admitted 10/16 for CABG x 1.  PMH: arthritis, GERD, hypothyroidism, hyperlipidemia, hypertension, CAD with prior stent to LAD 2017    PT Comments  Pt has met all of his goals. Currently Mod I for all functional activities. Good re-call of sternal precautions. Pt ambulating 400 ft at Mod I. Currently pt is presenting close to baseline level of functioning and no skilled physical therapy services recommended. Pt will be discharged from skilled physical therapy services at this time; please re-consult if further needs arise.        If plan is discharge home, recommend the following: Assistance with cooking/housework;Assist for transportation;Help with stairs or ramp for entrance     Equipment Recommendations  BSC/3in1       Precautions / Restrictions Precautions Precautions: Fall;Sternal Recall of Precautions/Restrictions: Intact Restrictions Weight Bearing Restrictions Per Provider Order: No RUE Weight Bearing Per Provider Order: Weight bearing as tolerated LUE Weight Bearing Per Provider Order: Weight bearing as tolerated Other Position/Activity Restrictions: sternal     Mobility  Bed Mobility     General bed mobility comments: OOB in chair    Transfers Overall transfer level: Modified independent Equipment used: None Transfers: Sit to/from Stand Sit to Stand: Modified independent (Device/Increase time)           General transfer comment: good hand placement across chest when standing    Ambulation/Gait Ambulation/Gait assistance: Modified independent (Device/Increase time) Gait Distance (Feet): 400 Feet Assistive device: None Gait Pattern/deviations: WFL(Within Functional Limits), Decreased stride length Gait velocity: mildly decreased Gait velocity interpretation: 1.31 - 2.62 ft/sec,  indicative of limited community ambulator   General Gait Details: slightly decreased stride length, reciprocal gait pattern   Stairs Stairs:  (pt has 1 4 inch step to get into home. Discussed how to navigate pt states good problem solving.)              Balance Overall balance assessment: Modified Independent Sitting-balance support: No upper extremity supported, Feet supported Sitting balance-Leahy Scale: Good     Standing balance support: No upper extremity supported, During functional activity Standing balance-Leahy Scale: Good      Communication Communication Communication: No apparent difficulties  Cognition Arousal: Alert Behavior During Therapy: WFL for tasks assessed/performed   PT - Cognitive impairments: No apparent impairments     Following commands: Intact      Cueing Cueing Techniques: Verbal cues     General Comments General comments (skin integrity, edema, etc.): no signs/symptoms of cardiac/respiratory distress during activity      Pertinent Vitals/Pain Pain Assessment Pain Assessment: Faces Faces Pain Scale: Hurts a little bit Facial Expression: Relaxed, neutral Body Movements: Absence of movements Muscle Tension: Relaxed Compliance with ventilator (intubated pts.): N/A Vocalization (extubated pts.): Talking in normal tone or no sound CPOT Total: 0 Pain Location: chest Pain Descriptors / Indicators: Discomfort Pain Intervention(s): Monitored during session    Home Living Family/patient expects to be discharged to:: Private residence Living Arrangements: Spouse/significant other;Children Available Help at Discharge: Family;Available PRN/intermittently Type of Home: House Home Access: Stairs to enter Entrance Stairs-Rails: None Entrance Stairs-Number of Steps: 1   Home Layout: One level;Laundry or work area in [employer] Equipment: Agricultural consultant (2 wheels);Shower seat - built in;Hand held Engineer, civil (consulting) - single point           PT Goals (current goals can now be found in  the care plan section) Progress towards PT goals: Goals met/education completed, patient discharged from PT       PT Plan  Discharge acute care physical therapy services   AM-PAC PT 6 Clicks Mobility   Outcome Measure  Help needed turning from your back to your side while in a flat bed without using bedrails?: None Help needed moving from lying on your back to sitting on the side of a flat bed without using bedrails?: None Help needed moving to and from a bed to a chair (including a wheelchair)?: None Help needed standing up from a chair using your arms (e.g., wheelchair or bedside chair)?: None Help needed to walk in hospital room?: None Help needed climbing 3-5 steps with a railing? : A Little 6 Click Score: 23    End of Session Equipment Utilized During Treatment: Gait belt Activity Tolerance: Patient tolerated treatment well Patient left: in chair;with call bell/phone within reach Nurse Communication: Mobility status PT Visit Diagnosis: Muscle weakness (generalized) (M62.81)     Time: 8572-8557 PT Time Calculation (min) (ACUTE ONLY): 15 min  Charges:    $Therapeutic Activity: 8-22 mins PT General Charges $$ ACUTE PT VISIT: 1 Visit                     Dorothyann Maier, DPT, CLT  Acute Rehabilitation Services Office: [phone] (Secure chat preferred)    Dorothyann VEAR Maier 05/25/2024, 2:47 PM

## 2024-05-25 NOTE — Plan of Care (Signed)
  Problem: Education: Goal: Knowledge of General Education information will improve Description: Including pain rating scale, medication(s)/side effects and non-pharmacologic comfort measures Outcome: Progressing   Problem: Health Behavior/Discharge Planning: Goal: Ability to manage health-related needs will improve Outcome: Progressing   Problem: Clinical Measurements: Goal: Ability to maintain clinical measurements within normal limits will improve Outcome: Progressing Goal: Cardiovascular complication will be avoided Outcome: Progressing   Problem: Elimination: Goal: Will not experience complications related to bowel motility Outcome: Progressing   Problem: Clinical Measurements: Goal: Postoperative complications will be avoided or minimized Outcome: Progressing

## 2024-05-26 LAB — GLUCOSE, CAPILLARY: Glucose-Capillary: 93 mg/dL (ref 70–99)

## 2024-05-26 MED ORDER — AMIODARONE HCL 200 MG PO TABS: 200.0000 mg | ORAL_TABLET | Freq: Two times a day (BID) | ORAL | 1 refills | Status: DC

## 2024-05-26 MED ORDER — FLUTICASONE PROPIONATE 50 MCG/ACT NA SUSP
2.0000 | Freq: Every day | NASAL | Status: AC | PRN
Start: 2024-05-26 — End: ?

## 2024-05-26 MED ORDER — ASPIRIN 325 MG PO TBEC: 325.0000 mg | DELAYED_RELEASE_TABLET | Freq: Every day | ORAL | Status: AC

## 2024-05-26 MED ORDER — METOPROLOL TARTRATE 25 MG PO TABS: 12.5000 mg | ORAL_TABLET | Freq: Two times a day (BID) | ORAL | 1 refills | Status: DC

## 2024-05-26 MED ORDER — AMLODIPINE BESYLATE 2.5 MG PO TABS: 2.5000 mg | ORAL_TABLET | Freq: Every day | ORAL | 1 refills | Status: AC

## 2024-05-26 MED ORDER — OXYCODONE HCL 5 MG PO TABS: 5.0000 mg | ORAL_TABLET | Freq: Four times a day (QID) | ORAL | 0 refills | Status: AC | PRN

## 2024-05-26 NOTE — Discharge Summary (Signed)
 [address] [place] [address]             [phone]        Physician Discharge Summary  Patient ID: Jeffrey Hooper MRN: [id number] DOB/AGE: 63-21-1962 63 y.o.  Admit date: 05/21/2024 Discharge date: 05/26/2024  Admission Diagnoses:  Patient Active Problem List   Diagnosis Date Noted   S/P CABG x 1 05/21/2024   Angina pectoris 03/19/2024   Left chest pressure 12/24/2023   Leukocytosis 12/24/2023   Coronary artery disease involving native coronary artery of native heart without angina pectoris 12/24/2023   Lightheadedness 12/24/2023   Symptomatic bradycardia 12/23/2023   Nausea 08/22/2022   LUQ abdominal pain 08/22/2022   Carotid artery calcification, bilateral 03/23/2022   Aortic atherosclerosis 03/23/2022   Hiatal hernia 03/23/2022   Bilateral lower extremity edema 07/11/2021   Pernicious anemia 10/31/2020   Bradycardia 09/10/2019   Educated about COVID-19 virus infection 09/10/2019   Skin lesion of cheek 03/19/2019   GAD (generalized anxiety disorder) 03/19/2019   Supraventricular tachycardia 03/05/2019   Clotting disorder 03/05/2019   Atherosclerosis of native coronary artery of native heart with angina pectoris 03/05/2019   Chronic postoperative pain 08/14/2018   Numbness of lower limb 07/28/2018   Lumbar radiculopathy, chronic 08/26/2017   Swelling of both lower extremities 07/05/2017   Spinal stenosis of lumbar region 06/03/2017   Alpha-gal hypersensitivity 04/03/2016   Renal insufficiency 12/14/2015   Anxiety 12/14/2015   CAD S/P percutaneous coronary angioplasty 12/03/2015   Abnormal nuclear stress test 12/02/2015   Perennial allergic rhinitis 11/13/2013   Multiple benign nevi    Mixed hyperlipidemia    Tinnitus of both ears 03/06/2013   Dyslipidemia 10/29/2012   Hypothyroidism 10/29/2012   GERD (gastroesophageal reflux disease) 10/29/2012   Prostatitis, chronic 10/29/2012   Essential hypertension 06/11/2008      Discharge Diagnoses:  Patient Active Problem List   Diagnosis Date Noted   S/P CABG x 1 05/21/2024   Angina pectoris 03/19/2024   Left chest pressure 12/24/2023   Leukocytosis 12/24/2023   Coronary artery disease involving native coronary artery of native heart without angina pectoris 12/24/2023   Lightheadedness 12/24/2023   Symptomatic bradycardia 12/23/2023   Nausea 08/22/2022   LUQ abdominal pain 08/22/2022   Carotid artery calcification, bilateral 03/23/2022   Aortic atherosclerosis 03/23/2022   Hiatal hernia 03/23/2022   Bilateral lower extremity edema 07/11/2021   Pernicious anemia 10/31/2020   Bradycardia 09/10/2019   Educated about COVID-19 virus infection 09/10/2019   Skin lesion of cheek 03/19/2019   GAD (generalized anxiety disorder) 03/19/2019   Supraventricular tachycardia 03/05/2019   Clotting disorder 03/05/2019   Atherosclerosis of native coronary artery of native heart with angina pectoris 03/05/2019   Chronic postoperative pain 08/14/2018   Numbness of lower limb 07/28/2018   Lumbar radiculopathy, chronic 08/26/2017   Swelling of both lower extremities 07/05/2017   Spinal stenosis of lumbar region 06/03/2017   Alpha-gal hypersensitivity 04/03/2016   Renal insufficiency 12/14/2015   Anxiety 12/14/2015   CAD S/P percutaneous coronary angioplasty 12/03/2015   Abnormal nuclear stress test 12/02/2015   Perennial allergic rhinitis 11/13/2013   Multiple benign nevi    Mixed hyperlipidemia    Tinnitus of both ears 03/06/2013   Dyslipidemia 10/29/2012   Hypothyroidism 10/29/2012   GERD (gastroesophageal reflux disease) 10/29/2012   Prostatitis, chronic 10/29/2012   Essential hypertension 06/11/2008     Discharged Condition: good    Referring: Gwenetta,  Chitrab* Primary Care: [name], DO Primary Cardiologist:James Lavona, MD   History of Present Illness:    At time of CT surgical evaluation Jeffrey Hooper is a 63 y.o. male  who presents for surgical evaluation of coronary artery disease.  He has a history of a CTO lesion to his RCA and has undergone attempted PCI.  This was unsuccessful.  He has a stent to the LAD which is patent.  Echocardiogram reveals preserved biventricular function and no significant valvular disease.  He continues to have exertional shortness of breath and chest tightness.  He also has decreased energy.  Following full review of the patient and relevant studies Dr. Shyrl recommended proceeding with off-pump CABG x 1.  Hospital course:  The patient was admitted electively on 05/21/2024 taken the operating room at which time he underwent CABG x 1 with left radial artery to the PDA.  He tolerated the procedure well was taken to the surgical intensive care unit in stable condition.  Postoperative hospital course:  The patient was weaned from the ventilator without difficulty using standard postcardiac surgical rapid weaning protocol.  Hemodynamics initially showed low cardiac indices and he was treated with Levophed.  This has been able to be weaned without difficulty.  He has been started on amlodipine  for radial artery long-term vasodilator management.  He did develop an early very mild elevation in his creatinine but it has returned to normal.  He had some expected postoperative volume overload not related to congestive failure which is being managed with routine diuresis.  He did develop postoperative atrial fibrillation and has been managed with the amiodarone protocol.  He did convert back to normal sinus rhythm.  Oxygen was weaned and he maintained good saturations on room air.  Incisions noted to be healing well without evidence of infection.  He is tolerating diet.  He is tolerating routine cardiac rehab modalities without difficulty.  Overall, at the time of discharge the patient is felt to be quite stable.    Consults: pulmonary/intensive care  Significant Diagnostic Studies:  DG Chest  Port 1 View Result Date: 05/23/2024 CLINICAL DATA:  Pneumothorax. EXAM: PORTABLE CHEST 1 VIEW COMPARISON:  05/22/2024 FINDINGS: Basilar atelectasis noted bilaterally without discernible pneumothorax. Right IJ central line remains in place. Midline thoracic drains persist. Telemetry leads overlie the chest. IMPRESSION: Basilar atelectasis without discernible pneumothorax. Electronically Signed   By: Camellia Candle M.D.   On: 05/23/2024 07:07   DG Chest Port 1 View Result Date: 05/22/2024 CLINICAL DATA:  Status post CABG. EXAM: PORTABLE CHEST 1 VIEW COMPARISON:  05/21/2024 FINDINGS: Interval extubation and NG tube removal. Right IJ central line remains in place. Thoracic drains again noted over the cardiomediastinal silhouette. No evidence for pneumothorax. No pulmonary edema or focal airspace consolidation. Probable atelectasis at the left base with trace left basilar effusion Telemetry leads overlie the chest. IMPRESSION: 1. Interval extubation and NG tube removal. 2. Probable atelectasis at the left base with trace left basilar effusion. Electronically Signed   By: Camellia Candle M.D.   On: 05/22/2024 07:17   DG Chest Port 1 View Result Date: 05/21/2024 EXAM: 1 VIEW(S) XRAY OF THE CHEST 05/21/2024 11:37:00 AM COMPARISON: 05/19/2024 CLINICAL HISTORY: [id number] S/P CABG x 1 [id number]. S/P CABG [id number] S/P CABG x 1 [id number]. S/P CABG FINDINGS: LINES, TUBES AND DEVICES: Endotracheal and nasogastric tubes are in appropriate position. Right internal jugular catheter is noted with tip in the SVC. Left-sided chest tube is noted. LUNGS AND PLEURA: Small left  pleural effusion is noted with associated atelectasis. Small right-sided pneumothorax is noted. No focal pulmonary opacity. No pulmonary edema. HEART AND MEDIASTINUM: No acute abnormality of the cardiac and mediastinal silhouettes. BONES AND SOFT TISSUES: No acute osseous abnormality. IMPRESSION: 1. Small right pneumothorax. This report will be called to the ordering  provider. 2. Small left pleural effusion with associated atelectasis; left-sided chest tube in place. Electronically signed by: Lynwood Seip MD 05/21/2024 11:59 AM EDT RP Workstation: [id number]   ECHO INTRAOPERATIVE TEE Result Date: 05/21/2024  *INTRAOPERATIVE TRANSESOPHAGEAL REPORT *  Patient Name:   Jeffrey Hooper Date of Exam: 05/21/2024 Medical Rec #:  [id number]       Height:       70.0 in Accession #:    [id number]      Weight:       206.2 lb Date of Birth:  06-17-1961       BSA:          2.11 m Patient Age:    63 years        BP:           128/72 mmHg Patient Gender: M               HR:           55 bpm. Exam Location:  Inpatient Transesophogeal exam was perform intraoperatively during surgical procedure. Patient was closely monitored under general anesthesia during the entirety of examination. Indications:     CAD Native Vessel i25.10 Sonographer:     Damien Senior RDCS Performing Phys: [id number] LINNIE KIDD LIGHTFOOT Diagnosing Phys: Elsie Needle MD Complications: No known complications during this procedure. POST-OP IMPRESSIONS Overall, there were no significant changes from pre-exam. PRE-OP FINDINGS  Left Ventricle: The left ventricle has normal systolic function, with an ejection fraction of 55-60%. The cavity size was normal. There is no increase in left ventricular wall thickness. No evidence of left ventricular regional wall motion abnormalities. There is no left ventricular hypertrophy. Right Ventricle: The right ventricle has normal systolic function. The cavity was normal. There is no increase in right ventricular wall thickness. Left Atrium: Left atrial size was not assessed. No left atrial/left atrial appendage thrombus was detected. Right Atrium: Right atrial size was not assessed. Interatrial Septum: No atrial level shunt detected by color flow Doppler. Pericardium: There is no evidence of pericardial effusion. Mitral Valve: The mitral valve is normal in structure. Mitral valve  regurgitation is trivial by color flow Doppler. Tricuspid Valve: The tricuspid valve was normal in structure. Tricuspid valve regurgitation was not visualized by color flow Doppler. Aortic Valve: The aortic valve is normal in structure. Aortic valve regurgitation is mild by color flow Doppler. Pulmonic Valve: The pulmonic valve was normal in structure. Pulmonic valve regurgitation is trivial by color flow Doppler.  Elsie Needle MD Electronically signed by Elsie Needle MD Signature Date/Time: 05/21/2024/11:39:56 AM    Final    DG Chest 2 View Result Date: 05/19/2024 CLINICAL DATA:  Preop chest exam. Upcoming coronary artery bypass graft. EXAM: CHEST - 2 VIEW COMPARISON:  04/24/2024 FINDINGS: The cardiomediastinal contours are normal. The lungs are clear. Pulmonary vasculature is normal. No consolidation, pleural effusion, or pneumothorax. No acute osseous abnormalities are seen. Lower thoracic spinal surgical hardware partially included. Spinal stimulator in place. IMPRESSION: No active cardiopulmonary disease. Electronically Signed   By: Andrea Gasman M.D.   On: 05/19/2024 16:48   VAS US  DOPPLER PRE CABG Result Date: 05/19/2024 PREOPERATIVE VASCULAR EVALUATION Patient Name:  Jeffrey  JONETTA Hooper  Date of Exam:   05/19/2024 Medical Rec #: [id number]        Accession #:    [id number] Date of Birth: 12/05/1960        Patient Gender: M Patient Age:   44 years Exam Location:  [facility] Procedure:      VAS US  DOPPLER PRE CABG Referring Phys: HARRELL LIGHTFOOT --------------------------------------------------------------------------------  Indications:   Pre-CABG. Risk Factors:  Hypertension, hyperlipidemia, coronary artery disease. Other Factors: GERD. Performing Technologist: Ricka Sturdivant-Jones RDMS, RVT  Examination Guidelines: A complete evaluation includes B-mode imaging, spectral Doppler, color Doppler, and power Doppler as needed of all accessible portions of each vessel. Bilateral  testing is considered an integral part of a complete examination. Limited examinations for reoccurring indications may be performed as noted.  Right Carotid Findings: +----------+--------+--------+--------+--------+------------------+           PSV cm/sEDV cm/sStenosisDescribeComments           +----------+--------+--------+--------+--------+------------------+ CCA Prox  83      16                                         +----------+--------+--------+--------+--------+------------------+ CCA Distal76      17                      intimal thickening +----------+--------+--------+--------+--------+------------------+ ICA Prox  44      16                      intimal thickening +----------+--------+--------+--------+--------+------------------+ ICA Distal57      22                                         +----------+--------+--------+--------+--------+------------------+ ECA       114     13                                         +----------+--------+--------+--------+--------+------------------+ +----------+--------+-------+----------------+------------+           PSV cm/sEDV cmsDescribe        Arm Pressure +----------+--------+-------+----------------+------------+ Subclavian126            Multiphasic, [id number]          +----------+--------+-------+----------------+------------+ +---------+--------+--+--------+--+---------+ VertebralPSV cm/s53EDV cm/s19Antegrade +---------+--------+--+--------+--+---------+ Left Carotid Findings: +----------+--------+--------+--------+--------+------------------+           PSV cm/sEDV cm/sStenosisDescribeComments           +----------+--------+--------+--------+--------+------------------+ CCA Prox  118     21                                         +----------+--------+--------+--------+--------+------------------+ CCA Distal82      21                      intimal thickening  +----------+--------+--------+--------+--------+------------------+ ICA Prox  45      18                      intimal thickening +----------+--------+--------+--------+--------+------------------+ ICA Distal53      22                                         +----------+--------+--------+--------+--------+------------------+  ECA       121     13                                         +----------+--------+--------+--------+--------+------------------+ +----------+--------+--------+----------------+------------+ SubclavianPSV cm/sEDV cm/sDescribe        Arm Pressure +----------+--------+--------+----------------+------------+           127             Multiphasic, WNL120          +----------+--------+--------+----------------+------------+ +---------+--------+--+--------+--+---------+ VertebralPSV cm/s50EDV cm/s22Antegrade +---------+--------+--+--------+--+---------+  ABI Findings: +------------------+-----+---------+ Rt Pressure (mmHg)IndexWaveform  +------------------+-----+---------+ 119                    triphasic +------------------+-----+---------+ 156               1.30 triphasic +------------------+-----+---------+ 165               1.38 triphasic +------------------+-----+---------+ +------------------+-----+---------+ Lt Pressure (mmHg)IndexWaveform  +------------------+-----+---------+ 120                    triphasic +------------------+-----+---------+ 157               1.31 triphasic +------------------+-----+---------+ 164               1.37 triphasic +------------------+-----+---------+ +-------+---------------+ ABI/TBIToday's ABI/TBI +-------+---------------+ Right  1.3             +-------+---------------+ Left   1.3             +-------+---------------+  Right Doppler Findings: +--------+--------+---------+ Site    PressureDoppler   +--------+--------+---------+ [id number]     triphasic  +--------+--------+---------+ Radial          triphasic +--------+--------+---------+ Ulnar           triphasic +--------+--------+---------+  Left Doppler Findings: +--------+--------+---------+ Site    PressureDoppler   +--------+--------+---------+ [id number]     triphasic +--------+--------+---------+ Radial          triphasic +--------+--------+---------+   Summary: Right Carotid: The extracranial vessels were near-normal with only minimal wall                thickening or plaque. Left Carotid: The extracranial vessels were near-normal with only minimal wall               thickening or plaque. Vertebrals:  Bilateral vertebral arteries demonstrate antegrade flow. Subclavians: Normal flow hemodynamics were seen in bilateral subclavian              arteries. Right ABI: Resting right ankle-brachial index is within normal range. Left ABI: Resting left ankle-brachial index is within normal range. Right Upper Extremity: Doppler waveforms remain within normal limits with right radial compression. Doppler waveform obliterate with right ulnar compression. Left Upper Extremity: Doppler waveforms remain within normal limits with left radial compression. Doppler waveforms remain within normal limits with left ulnar compression.  Electronically signed by Debby Robertson on 05/19/2024 at 4:29:16 PM.    Final     Treatments: surgery:   05/21/2024       Patient:  Jeffrey Hooper Pre-Op Dx: coronary artery disease HTN HLP DM   Post-op Dx:  same Procedure: Off pump CABG X 1.  Left radial to PDA   Open harvest of the left radial artery     Surgeon and Role:      * Lightfoot, Linnie KIDD, MD - Primary    *  MICAEL Cera, PA-C - assisting  Discharge Exam: Blood pressure 128/73, pulse 80, temperature 97.9 F (36.6 C), temperature source Oral, resp. rate 15, height 5' 10 (1.778 m), weight 93.2 kg, SpO2 97%.  General appearance: alert, cooperative, and no distress Heart: regular rate and  rhythm Lungs: clear to auscultation bilaterally Abdomen: benign Extremities: minor edema Wound: incis healing well   Discharge Medications:  The patient has been discharged on:   1.Beta Blocker:  Yes [   y]                              No   [   ]                              If No, reason:  2.Ace Inhibitor/ARB: Yes [   ]                                     No  [  n  ]                                     If No, reason:soft BP at times on current meds  3.Statin:   Yes [ y  ]                  No  [   ]                  If No, reason:  4.Ecasa:  Yes  Davis.Dad   ]                  No   [   ]                  If No, reason:  Patient had ACS upon admission:No  Plavix /P2Y12 inhibitor: Yes [   ]                                      No  [ n  ]     Discharge Instructions     Amb Referral to Cardiac Rehabilitation   Complete by: As directed    Diagnosis: CABG   CABG X ___: 1   After initial evaluation and assessments completed: Virtual Based Care may be provided alone or in conjunction with Phase 2 Cardiac Rehab based on patient barriers.: Yes   Intensive Cardiac Rehabilitation (ICR) [facility] location only OR Traditional Cardiac Rehabilitation (TCR) *If criteria for ICR are not met will enroll in TCR ([facility] only): Yes      Allergies as of 05/26/2024       Reactions   Alpha-gal Rash, Other (See Comments)   SEVERE ALLERGY  TO ANY MEAT OR MEAT DERIVED PRODUCTS FROM 4 LEGGED ANIMALS > > BEEF, PORK , GOATS, DEER, ETC. < < RESULT OF BITE FROM LONE STAR TICK   Bovine (beef) Protein Rash, Other (See Comments)   Bovine (beef) Protein-containing Drug Products Rash, Other (See Comments)   Lambs Quarters Rash, Other (See Comments)   Porcine (pork) Protein-containing Drug Products Rash, Other (See Comments)   Prednisone  Other (See Comments)   im  depo medrol  caused dizziness - pt passed out. Ask pt before giving. Pt can take oral prednisone     Brilinta  [ticagrelor ] Hives   Pt ate pork on the  same day he took Brilinta  before knowing he had alpha gal. May have been the alpha gal reaction, pt is unsure.    Lyrica [pregabalin]    Dry mouth and felt raw   Methylprednisolone  Sodium Succ    im depo medrol  caused dizziness - pt passed out. Ask pt before giving. Issue occurred when administered in the left arm    Penicillins Hives   Has tolerated since   Shellfish Allergy  Hives   Adhesive [tape] Rash   Doxycycline Hives, Swelling, Rash   Fentanyl  Rash   Reaction to adhesive, not the drug         Medication List     STOP taking these medications    furosemide  20 MG tablet Commonly known as: LASIX    HYDROcodone -acetaminophen  5-325 MG tablet Commonly known as: NORCO/VICODIN   isosorbide  mononitrate 120 MG 24 hr tablet Commonly known as: IMDUR    nitroGLYCERIN  0.4 MG SL tablet Commonly known as: NITROSTAT    olmesartan  40 MG tablet Commonly known as: BENICAR    ranolazine  1000 MG SR tablet Commonly known as: Ranexa        TAKE these medications    acetaminophen  500 MG tablet Commonly known as: TYLENOL  Take 1,000 mg by mouth every 8 (eight) hours as needed for moderate pain (pain score 4-6).   albuterol  108 (90 Base) MCG/ACT inhaler Commonly known as: Ventolin  HFA TAKE 2 PUFFS BY MOUTH EVERY 6 HOURS AS NEEDED FOR WHEEZE OR SHORTNESS OF BREATH   amiodarone 200 MG tablet Commonly known as: PACERONE Take 1 tablet (200 mg total) by mouth 2 (two) times daily.   amLODipine  2.5 MG tablet Commonly known as: NORVASC  Take 1 tablet (2.5 mg total) by mouth daily. What changed:  medication strength when to take this additional instructions   aspirin  EC 325 MG tablet Take 1 tablet (325 mg total) by mouth daily. What changed:  medication strength how much to take when to take this   atorvastatin  80 MG tablet Commonly known as: LIPITOR  TAKE 1 TABLET BY MOUTH EVERYDAY AT BEDTIME   cetirizine  10 MG tablet Commonly known as: ZYRTEC  Take 10 mg by mouth daily.    EPINEPHrine  0.3 mg/0.3 mL Soaj injection Commonly known as: EPI-PEN Inject 0.3 mg into the muscle as needed for anaphylaxis.   ezetimibe  10 MG tablet Commonly known as: ZETIA  Take 1 tablet (10 mg total) by mouth daily. What changed: when to take this   fluticasone  50 MCG/ACT nasal spray Commonly known as: FLONASE  Place 2 sprays into both nostrils daily as needed for allergies or rhinitis.   levothyroxine  100 MCG tablet Commonly known as: SYNTHROID  Take 1 tablet (100 mcg total) by mouth daily.   MAGNESIUM  GLYCINATE PO Take 2 tablets by mouth at bedtime.   metoprolol  tartrate 25 MG tablet Commonly known as: LOPRESSOR  Take 0.5 tablets (12.5 mg total) by mouth 2 (two) times daily.   ondansetron  4 MG disintegrating tablet Commonly known as: ZOFRAN -ODT TAKE 1 TABLET BY MOUTH EVERY 8 HOURS AS NEEDED FOR NAUSEA AND VOMITING   oxyCODONE  5 MG immediate release tablet Commonly known as: Oxy IR/ROXICODONE  Take 1 tablet (5 mg total) by mouth every 6 (six) hours as needed for up to 7 days for severe pain (pain score 7-10).   pantoprazole  40 MG tablet Commonly known as: PROTONIX  Take 40 mg by  mouth 2 (two) times daily.   PARoxetine  20 MG tablet Commonly known as: PAXIL  Take 1 tablet (20 mg total) by mouth daily. What changed: when to take this        Follow-up Information     Lightfoot, Linnie KIDD, MD Follow up.   Specialty: Cardiothoracic Surgery Why: See discharge paperwork for details of appt with surgeon. First visit will be a phone call Contact information: [address], [place] [address] [phone]                 Signed:  Lemond FORBES Cera, PA-C  05/26/2024, 11:40 AM

## 2024-05-26 NOTE — TOC Transition Note (Signed)
 Transition of Care (TOC) - Discharge Note Rayfield Gobble RN, BSN Inpatient Care Management Unit 4E- RN Case Manager See Treatment Team for direct phone #   Patient Details  Name: Jeffrey Hooper MRN: [id number] Date of Birth: 02/16/1961  Transition of Care [facility]) CM/SW Contact:  Gobble Rayfield Hurst, RN Phone Number: 05/26/2024, 10:11 AM   Clinical Narrative:    Pt stable for transition home today, pt left prior to CM getting to the unit. [facility] liaison notified CM that TCTS office made referral for home health needs- liaison spoke with pt pre-op and he was agreeable to follow up.  Liaison to reach out to pt to follow up post discharge.   No other CM needs noted.      Final next level of care: Home w Home Health Services Barriers to Discharge: No Barriers Identified   Patient Goals and CMS Choice Patient states their goals for this hospitalization and ongoing recovery are:: return home          Discharge Placement               Home w/ [facility]        Discharge Plan and Services Additional resources added to the After Visit Summary for     Discharge Planning Services: CM Consult Post Acute Care Choice: Home Health          DME Arranged: N/A DME Agency: NA         HH Agency: [facility] Date HH Agency Contacted: 05/26/24   Representative spoke with at [facility] Agency: Zebedee  Social Drivers of Health (SDOH) Interventions SDOH Screenings   Food Insecurity: No Food Insecurity (05/21/2024)  Housing: Low Risk  (05/21/2024)  Transportation Needs: No Transportation Needs (05/21/2024)  Utilities: Not At Risk (05/21/2024)  Alcohol Screen: Low Risk  (08/30/2023)  Depression (PHQ2-9): Medium Risk (03/09/2024)  Financial Resource Strain: Low Risk  (03/06/2024)  Physical Activity: Unknown (03/06/2024)  Social Connections: Socially Integrated (05/21/2024)  Stress: No Stress Concern Present (03/06/2024)  Tobacco Use: Medium Risk (05/21/2024)  Health  Literacy: Adequate Health Literacy (08/30/2023)     Readmission Risk Interventions    05/26/2024   10:11 AM 12/25/2023    3:06 PM  Readmission Risk Prevention Plan  Post Dischage Appt  Complete  Medication Screening  Complete  Transportation Screening Complete Complete  Home Care Screening Complete   Medication Review (RN CM) Complete

## 2024-05-26 NOTE — Progress Notes (Signed)
 5 Days Post-Op Procedure(s) (LRB): OFF PUMP CORONARY ARTERY BYPASS GRAFTING (CABG) TIMES ONE UTILIZING LEFT RADIAL ARTERY (N/A) ECHOCARDIOGRAM, TRANSESOPHAGEAL, INTRAOPERATIVE (N/A) SURGICAL PROCUREMENT, ARTERY, RADIAL (Left) Subjective: Feels well  Objective: Vital signs in last 24 hours: Temp:  [98 F (36.7 C)-98.4 F (36.9 C)] 98.3 F (36.8 C) (10/21 0410) Pulse Rate:  [59-82] 71 (10/21 0410) Cardiac Rhythm: Normal sinus rhythm (10/20 2100) Resp:  [16-23] 23 (10/21 0520) BP: (96-132)/(58-70) 132/70 (10/21 0410) SpO2:  [92 %-100 %] 97 % (10/21 0410) Weight:  [93.2 kg] 93.2 kg (10/21 0520)  Hemodynamic parameters for last 24 hours:    Intake/Output from previous day: 10/20 0701 - 10/21 0700 In: 240 [P.O.:240] Out: -  Intake/Output this shift: No intake/output data recorded.  General appearance: alert, cooperative, and no distress Heart: regular rate and rhythm Lungs: clear to auscultation bilaterally Abdomen: benign Extremities: minor edema Wound: incis healing well  Lab Results: Recent Labs    05/24/24 0506  WBC 9.3  HGB 10.6*  HCT 31.3*  PLT 147*   BMET:  Recent Labs    05/24/24 0506  NA 139  K 3.5  CL 105  CO2 25  GLUCOSE 104*  BUN 12  CREATININE 1.07  CALCIUM  8.5*    PT/INR: No results for input(s): LABPROT, INR in the last 72 hours. ABG    Component Value Date/Time   PHART 7.352 05/21/2024 1712   HCO3 21.0 05/21/2024 1712   TCO2 22 05/21/2024 1712   ACIDBASEDEF 4.0 (H) 05/21/2024 1712   O2SAT 97 05/21/2024 1712   CBG (last 3)  Recent Labs    05/25/24 1732 05/25/24 2111 05/26/24 0622  GLUCAP 98 130* 93    Meds Scheduled Meds:  acetaminophen   1,000 mg Oral Q6H   Or   acetaminophen  (TYLENOL ) oral liquid 160 mg/5 mL  1,000 mg Per Tube Q6H   amiodarone  400 mg Oral BID   amLODipine   2.5 mg Oral Daily   aspirin  EC  325 mg Oral Daily   Or   aspirin   324 mg Per Tube Daily   atorvastatin   80 mg Oral Daily   bisacodyl   10 mg  Oral Daily   Or   bisacodyl   10 mg Rectal Daily   Chlorhexidine  Gluconate Cloth  6 each Topical Daily   docusate sodium   200 mg Oral Daily   ezetimibe   10 mg Oral QHS   furosemide   40 mg Oral Daily   insulin aspart  0-24 Units Subcutaneous TID AC & HS   levothyroxine   100 mcg Oral Daily   loratadine   10 mg Oral Daily   methocarbamol   500 mg Oral TID   metoprolol  tartrate  12.5 mg Oral BID   pantoprazole   40 mg Oral BID   PARoxetine   20 mg Oral QHS   potassium chloride   40 mEq Oral Daily   Continuous Infusions:  albumin  human Stopped (05/21/24 1509)   PRN Meds:.albumin  human, dextrose , metoprolol  tartrate, morphine  injection, ondansetron  (ZOFRAN ) IV, mouth rinse, oxyCODONE , traMADol   Xrays No results found.  Assessment/Plan: S/P Procedure(s) (LRB): OFF PUMP CORONARY ARTERY BYPASS GRAFTING (CABG) TIMES ONE UTILIZING LEFT RADIAL ARTERY (N/A) ECHOCARDIOGRAM, TRANSESOPHAGEAL, INTRAOPERATIVE (N/A) SURGICAL PROCUREMENT, ARTERY, RADIAL (Left) POD#5  1 afeb, VSS, sinus rhythm, on amio for post op afib, on norvasc  for radial 2 O2 sats good on RA 3 weight below preop- can d/c diuretics now, minor edema may  be d/t amlodipine  4 BS ok control 5 no new labs or xrays 6 stable for D/C-  reviewed instructions     LOS: 5 days    Lemond FORBES Cera PA-C Pager [phone] 05/26/2024

## 2024-05-27 ENCOUNTER — Telehealth: Payer: Self-pay

## 2024-05-27 MED FILL — Potassium Chloride Inj 2 mEq/ML: INTRAVENOUS | Qty: 40 | Status: AC

## 2024-05-27 MED FILL — Lidocaine HCl Local Preservative Free (PF) Inj 2%: INTRAMUSCULAR | Qty: 14 | Status: AC

## 2024-05-27 MED FILL — Sodium Chloride IV Soln 0.9%: INTRAVENOUS | Qty: 2000 | Status: AC

## 2024-05-27 MED FILL — Heparin Sodium (Porcine) Inj 1000 Unit/ML: Qty: 1000 | Status: AC

## 2024-05-27 NOTE — Transitions of Care (Post Inpatient/ED Visit) (Signed)
   05/27/2024  Name: Jeffrey Hooper MRN: [id number] DOB: 1960-08-07  Today's TOC FU Call Status: Today's TOC FU Call Status:: Unsuccessful Call (1st Attempt) Unsuccessful Call (1st Attempt) Date: 05/26/24 (patient answer and states home health nurse is with him right now and call back tomorrow.)  Attempted to reach the patient regarding the most recent Inpatient/ED visit.  Follow Up Plan: Additional outreach attempts will be made to reach the patient to complete the Transitions of Care (Post Inpatient/ED visit) call.   Alan Ee, RN, BSN, CEN [employer]- Transition of Care Team.  [facility] [phone]

## 2024-05-28 ENCOUNTER — Telehealth: Payer: Self-pay | Admitting: *Deleted

## 2024-05-28 NOTE — Transitions of Care (Post Inpatient/ED Visit) (Signed)
 05/28/2024  Name: Jeffrey Hooper MRN: [id number] DOB: March 18, 1961  Today's TOC FU Call Status: Today's TOC FU Call Status:: Successful TOC FU Call Completed TOC FU Call Complete Date: 05/28/24 Patient's Name and Date of Birth confirmed.  Transition Care Management Follow-up Telephone Call Date of Discharge: 05/26/24 Discharge Facility: [facility]) Type of Discharge: Inpatient Admission Primary Inpatient Discharge Diagnosis:: s/p CABG x 1 How have you been since you were released from the [facility]?:  (eating, drinking well, no issues with bowel/ bladder) Any questions or concerns?: No  Items Reviewed: Did you receive and understand the discharge instructions provided?: Yes Medications obtained,verified, and reconciled?: Yes (Medications Reviewed) Any new allergies since your discharge?: No Dietary orders reviewed?: Yes Type of Diet Ordered:: heart healthy Do you have support at home?: Yes People in Home [RPT]: spouse Name of Support/Comfort Primary Source: Jeffrey [facility]  DPR Reviewed signs/ symptoms infection Reviewed sternal precautions  Medications Reviewed Today: Medications Reviewed Today     Reviewed by Jeffrey Mliss LABOR, RN (Registered Nurse) on 05/28/24 at 1416  Med List Status: <None>   Medication Order Taking? Sig Documenting Provider Last Dose Status Informant  acetaminophen  (TYLENOL ) 500 MG tablet [id number] Yes Take 1,000 mg by mouth every 8 (eight) hours as needed for moderate pain (pain score 4-6). [provider]  Active Self, Pharmacy Records  albuterol  (VENTOLIN  HFA) [phone]) MCG/ACT inhaler [id number] Yes TAKE 2 PUFFS BY MOUTH EVERY 6 HOURS AS NEEDED FOR WHEEZE OR SHORTNESS OF BREATH Jeffrey [facility]oper M, DO  Active Self, Pharmacy Records  amiodarone (PACERONE) 200 MG tablet [id number] Yes Take 1 tablet (200 mg total) by mouth 2 (two) times daily. Gold, Wayne E, PA-C  Active   amLODipine  (NORVASC ) 2.5 MG tablet [id number] Yes Take 1 tablet (2.5 mg  total) by mouth daily. Gold, Wayne E, PA-C  Active   aspirin  EC 325 MG tablet [id number] Yes Take 1 tablet (325 mg total) by mouth daily. Gold, Wayne E, PA-C  Active   atorvastatin  (LIPITOR ) 80 MG tablet [id number] Yes TAKE 1 TABLET BY MOUTH EVERYDAY AT BEDTIME Jeffrey [facility]oper M, DO  Active Self, Pharmacy Records  cetirizine  (ZYRTEC ) 10 MG tablet [id number] Yes Take 10 mg by mouth daily. [provider]  Active Self, Pharmacy Records  cyanocobalamin  (VITAMIN B12) injection 1,000 mcg [id number]   Jeffrey Hooper M, DO  Active   EPINEPHrine  0.3 mg/0.3 mL IJ SOAJ injection [id number] Yes Inject 0.3 mg into the muscle as needed for anaphylaxis. Jeffrey Agent, MD  Active Self, Pharmacy Records  ezetimibe  (ZETIA ) 10 MG tablet [id number] Yes Take 1 tablet (10 mg total) by mouth daily. [place], Peter M, MD  Active Self, Pharmacy Records  fluticasone  (FLONASE ) 50 MCG/ACT nasal spray [id number] Yes Place 2 sprays into both nostrils daily as needed for allergies or rhinitis. Gold, Wayne E, PA-C  Active   levothyroxine  (SYNTHROID ) 100 MCG tablet [id number] Yes Take 1 tablet (100 mcg total) by mouth daily. Jeffrey Hooper [name], DO  Active Self, Pharmacy Records  MAGNESIUM  GLYCINATE PO [id number] Yes Take 2 tablets by mouth at bedtime. [provider]  Active Self, Pharmacy Records  metoprolol  tartrate (LOPRESSOR ) 25 MG tablet [id number] Yes Take 0.5 tablets (12.5 mg total) by mouth 2 (two) times daily. Gold, Wayne E, PA-C  Active   ondansetron  (ZOFRAN -ODT) 4 MG disintegrating tablet [id number] Yes TAKE 1 TABLET BY MOUTH EVERY 8 HOURS AS NEEDED FOR NAUSEA AND VOMITING Jeffrey Hooper [name], DO  Active Self, Pharmacy Records  oxyCODONE  (  OXY IR/ROXICODONE ) 5 MG immediate release tablet [id number]  Take 1 tablet (5 mg total) by mouth every 6 (six) hours as needed for up to 7 days for severe pain (pain score 7-10).  Patient not taking: Reported on 05/28/2024   Gold, Wayne E, PA-C  Active   pantoprazole   (PROTONIX ) 40 MG tablet [id number] Yes Take 40 mg by mouth 2 (two) times daily. [provider]  Active Self, Pharmacy Records  PARoxetine  (PAXIL ) 20 MG tablet [id number] Yes Take 1 tablet (20 mg total) by mouth daily. Jeffrey Norene [name], DO  Active Self, Pharmacy Records            Home Care and Equipment/Supplies: Were Home Health Services Ordered?: Yes Name of Home Health Agency:: [facility] Has Agency set up a time to come to your home?: Yes First Home Health Visit Date: 05/27/24 Any new equipment or medical supplies ordered?: No  Functional Questionnaire: Do you need assistance with bathing/showering or dressing?: Yes (spouse assists as needed) Do you need assistance with meal preparation?: Yes (spouse assists) Do you need assistance with eating?: No Do you have difficulty maintaining continence: No Do you need assistance with getting out of bed/getting out of a chair/moving?: Yes (cane) Do you have difficulty managing or taking your medications?: No  Follow up appointments reviewed: PCP Follow-up appointment confirmed?: Yes Date of PCP follow-up appointment?: 06/10/24 Follow-up Provider: Norene Jolinda DO Specialist [facility] Follow-up appointment confirmed?: Yes Date of Specialist follow-up appointment?: 05/29/24 Follow-Up Specialty Provider:: telephone appointment with Dr. Shyrl (surgeon) Do you need transportation to your follow-up appointment?: No Do you understand care options if your condition(s) worsen?: Yes-patient verbalized understanding  SDOH Interventions Today    Flowsheet Row Most Recent Value  SDOH Interventions   Food Insecurity Interventions Intervention Not Indicated  Housing Interventions Intervention Not Indicated  Transportation Interventions Intervention Not Indicated  Utilities Interventions Intervention Not Indicated    M[facility]iss [facility], BSN RN Care Manager/ Transition of Care [facility] [phone]

## 2024-05-29 ENCOUNTER — Ambulatory Visit
Payer: Self-pay | Attending: Thoracic Surgery (Cardiothoracic Vascular Surgery) | Admitting: Thoracic Surgery (Cardiothoracic Vascular Surgery)

## 2024-05-29 DIAGNOSIS — I2510 Atherosclerotic heart disease of native coronary artery without angina pectoris: Secondary | ICD-10-CM

## 2024-05-29 NOTE — Progress Notes (Signed)
     [address]       [name] [address]             [phone]       Patient: Home Provider: Office Consent for Telemedicine visit obtained.  Today's visit was completed via a real-time telehealth (see specific modality noted below). The patient/authorized person provided oral consent at the time of the visit to engage in a telemedicine encounter with the present provider at [facility]. The patient/authorized person was informed of the potential benefits, limitations, and risks of telemedicine. The patient/authorized person expressed understanding that the laws that protect confidentiality also apply to telemedicine. The patient/authorized person acknowledged understanding that telemedicine does not provide emergency services and that he or she would need to call 911 or proceed to the nearest hospital for help if such a need arose.   Total time spent in the clinical discussion 10 minutes.  Telehealth Modality: Phone visit (audio only)  I had a telephone visit with  Rutha JONETTA Lamer who is s/p CABG.  Overall doing well.  Pain is minimal.  Ambulating well. Vitals have been stable.  HOLLY PRING will see us  back in 1 month with a chest x-ray for cardiac rehab clearance.  Annalese Stiner MALVA Rayas

## 2024-05-31 ENCOUNTER — Other Ambulatory Visit: Payer: Self-pay | Admitting: Cardiology

## 2024-05-31 DIAGNOSIS — I2510 Atherosclerotic heart disease of native coronary artery without angina pectoris: Secondary | ICD-10-CM

## 2024-06-01 ENCOUNTER — Telehealth: Payer: Self-pay

## 2024-06-01 NOTE — Telephone Encounter (Signed)
 Per [name],NP patient had false positive HIV results on 05/22/2024 and doesn't need to be seen at [facility]. Patient aware.    Jeffrey Hooper SHAUNNA Letters, CMA

## 2024-06-02 ENCOUNTER — Telehealth: Payer: Self-pay | Admitting: Cardiology

## 2024-06-02 NOTE — Telephone Encounter (Signed)
 Jordan, Peter M, MD to Gretel Maeola CROME, RN  Shyrl Linnie KIDD, MD      06/02/24 11:38 AM I would have him stop metoprolol     [name] MD, [facility]  Spoke with pt, advised that Dr. Jordan wants him to stop taking metoprolol . Pt will check his heart rate once or twice a day until he is seen next week on 11/6 with Katlyn West, NP. Pt verbalizes understanding. Pt inquires about driving. Advised pt that after CABG he can't drive for at least 6 weeks. Advised pt that Dr. Lang office could advise him when it is safe to drive. Pt verbalizes understanding.

## 2024-06-02 NOTE — Telephone Encounter (Signed)
 STAT if HR is under 50 or over 120  (normal HR is 60-100 beats per minute)  What is your heart rate? 38 -  today; 46 - while on the phone   Do you have a log of your heart rate readings (document readings)? 38-50   Do you have any other symptoms?  No

## 2024-06-02 NOTE — Telephone Encounter (Signed)
 Pt reporting low HRs.  States HHRN took his pulse yesterday with a pulse ox and it was showing HR in the 40-50s.  [facility] did not check it manually.  Pt checked it himself, with a pulse ox, this morning and HR was 50.  States he held his Lopressor  this morning due to low HR. States TCTS instructed him that his HR needs to stay at or above 56 if possible.  (CABG 10/16)  Pt educated to post procedure afib, also advised to have HHRN take pulse manually for a full minute.  Pt denies any symptoms r/t low HRs/afib (no dizziness, light headedness, near syncope, palpitations, etc). Advised pt to continue taking his Lopressor  as scheduled and will forward to provider to see if other advisement necessary. Aware will forward to Dr. Shyrl for his FYI. Forwarding to Dr. Jordan for any other advisement and that office will let him know what/if any MD recommends. Advised to call office if symptoms begin to occur. Pt agreeable to plan.

## 2024-06-10 ENCOUNTER — Encounter: Payer: Self-pay | Admitting: Family Medicine

## 2024-06-10 ENCOUNTER — Ambulatory Visit (INDEPENDENT_AMBULATORY_CARE_PROVIDER_SITE_OTHER): Payer: Self-pay | Admitting: Family Medicine

## 2024-06-10 VITALS — BP 127/65 | HR 58 | Temp 98.0°F | Ht 70.0 in | Wt 207.5 lb

## 2024-06-10 DIAGNOSIS — Z23 Encounter for immunization: Secondary | ICD-10-CM | POA: Diagnosis not present

## 2024-06-10 DIAGNOSIS — E782 Mixed hyperlipidemia: Secondary | ICD-10-CM

## 2024-06-10 DIAGNOSIS — E538 Deficiency of other specified B group vitamins: Secondary | ICD-10-CM | POA: Diagnosis not present

## 2024-06-10 DIAGNOSIS — D510 Vitamin B12 deficiency anemia due to intrinsic factor deficiency: Secondary | ICD-10-CM | POA: Diagnosis not present

## 2024-06-10 DIAGNOSIS — R7989 Other specified abnormal findings of blood chemistry: Secondary | ICD-10-CM

## 2024-06-10 DIAGNOSIS — Z951 Presence of aortocoronary bypass graft: Secondary | ICD-10-CM | POA: Diagnosis not present

## 2024-06-10 LAB — LIPID PANEL

## 2024-06-10 NOTE — Progress Notes (Signed)
 Subjective: CC:45m recheck PCP: Jeffrey Norene [name], DO Jeffrey Hooper is a 63 y.o. male presenting to clinic today for:  Since last OV, patient has undergone CABG.  He was also placed on Zetia  by cardiology.  Metoprolol  has been dc'd.  Has seen cardiothoracic surgery and will see them again in a couple weeks for recheck CXR and cardiac clearance for rehab, during this time he was told not to drive.  He had slight Cr bump after CABG, hgb also dropped and dc hgb was 10.6. Baseline is around 12.5. He has known pernicious anemia and receives B12 injection, due for one today.  Last B12 level was normal.  Overall he seems to be recovering from the CABG without any issues.  He reports that he is tolerating the Zetia  which was added by cardiology without difficulty but he certainly wants to have his cholesterol repeated today.  He is nonfasting of note and ate oatmeal this morning.  He has some soreness at the surgical site but no overt chest pain or changes in breathing with ambulation.  His arm seems to have healed well from where they took the graft   ROS: Per HPI  Allergies  Allergen Reactions   Alpha-Gal Rash and Other (See Comments)    SEVERE ALLERGY  TO ANY MEAT OR MEAT DERIVED PRODUCTS FROM 4 LEGGED ANIMALS > > BEEF, PORK , GOATS, DEER, ETC. < < RESULT OF BITE FROM LONE STAR TICK   Bovine (Beef) Protein Rash and Other (See Comments)   Bovine (Beef) Protein-Containing Drug Products Rash and Other (See Comments)   Lambs Quarters Rash and Other (See Comments)   Porcine (Pork) Protein-Containing Drug Products Rash and Other (See Comments)   Prednisone  Other (See Comments)    im depo medrol  caused dizziness - pt passed out. Ask pt before giving. Pt can take oral prednisone     Brilinta  [Ticagrelor ] Hives    Pt ate pork on the same day he took Brilinta  before knowing he had alpha gal. May have been the alpha gal reaction, pt is unsure.    Lyrica [Pregabalin]     Dry mouth and felt raw    Methylprednisolone  Sodium Succ     im depo medrol  caused dizziness - pt passed out. Ask pt before giving. Issue occurred when administered in the left arm    Penicillins Hives    Has tolerated since   Shellfish Allergy  Hives   Adhesive [Tape] Rash   Doxycycline Hives, Swelling and Rash   Fentanyl  Rash    Reaction to adhesive, not the drug    Past Medical History:  Diagnosis Date   Abnormal nuclear stress test 12/02/2015   Allergy     Allergy  to alpha-gal    Arthritis    CAD (coronary artery disease), native coronary artery 12/03/2015   Coronary artery disease    Difficult intubation    anterior larynx and limited oral opening, glidescope used electively   Dysrhythmia    PACs and SVT   GERD (gastroesophageal reflux disease)    Head injury with loss of consciousness (HCC)    back in the 1980's   History of hiatal hernia    History of kidney stones    Hyperlipidemia    Hypertension    Hypothyroid    Multiple benign nevi    Neuromuscular disorder (HCC) 2011   LEFT RADIAL NERVE SURGERY R/T TRAUMA   Pneumonia 2014, 2017   Prostatism    Pulmonary nodule    Spinal cord stimulator status  told to bring remote to surgery    Current Outpatient Medications:    acetaminophen  (TYLENOL ) 500 MG tablet, Take 1,000 mg by mouth every 8 (eight) hours as needed for moderate pain (pain score 4-6)., Disp: , Rfl:    albuterol  (VENTOLIN  HFA) 108 (90 Base) MCG/ACT inhaler, TAKE 2 PUFFS BY MOUTH EVERY 6 HOURS AS NEEDED FOR WHEEZE OR SHORTNESS OF BREATH, Disp: 18 each, Rfl: 1   amiodarone (PACERONE) 200 MG tablet, Take 1 tablet (200 mg total) by mouth 2 (two) times daily., Disp: 60 tablet, Rfl: 1   amLODipine  (NORVASC ) 2.5 MG tablet, Take 1 tablet (2.5 mg total) by mouth daily., Disp: 30 tablet, Rfl: 1   aspirin  EC 325 MG tablet, Take 1 tablet (325 mg total) by mouth daily., Disp: , Rfl:    atorvastatin  (LIPITOR ) 80 MG tablet, TAKE 1 TABLET BY MOUTH EVERYDAY AT BEDTIME, Disp: 90 tablet, Rfl: 3    cetirizine  (ZYRTEC ) 10 MG tablet, Take 10 mg by mouth daily., Disp: , Rfl:    EPINEPHrine  0.3 mg/0.3 mL IJ SOAJ injection, Inject 0.3 mg into the muscle as needed for anaphylaxis., Disp: 2 each, Rfl: 0   ezetimibe  (ZETIA ) 10 MG tablet, Take 1 tablet (10 mg total) by mouth daily., Disp: 90 tablet, Rfl: 3   fluticasone  (FLONASE ) 50 MCG/ACT nasal spray, Place 2 sprays into both nostrils daily as needed for allergies or rhinitis., Disp: , Rfl:    levothyroxine  (SYNTHROID ) 100 MCG tablet, Take 1 tablet (100 mcg total) by mouth daily., Disp: 90 tablet, Rfl: 3   MAGNESIUM  GLYCINATE PO, Take 2 tablets by mouth at bedtime., Disp: , Rfl:    ondansetron  (ZOFRAN -ODT) 4 MG disintegrating tablet, TAKE 1 TABLET BY MOUTH EVERY 8 HOURS AS NEEDED FOR NAUSEA AND VOMITING, Disp: 20 tablet, Rfl: 0   pantoprazole  (PROTONIX ) 40 MG tablet, Take 40 mg by mouth 2 (two) times daily., Disp: , Rfl:    PARoxetine  (PAXIL ) 20 MG tablet, Take 1 tablet (20 mg total) by mouth daily., Disp: 90 tablet, Rfl: 3  Current Facility-Administered Medications:    cyanocobalamin  (VITAMIN B12) injection 1,000 mcg, 1,000 mcg, Intramuscular, Q30 days, Lucero Auzenne M, DO, 1,000 mcg at 05/08/24 9146 Social History   Socioeconomic History   Marital status: Married    Spouse name: Not on file   Number of children: 3   Years of education: Not on file   Highest education level: GED or equivalent  Occupational History   Occupation: [employer]  Tobacco Use   Smoking status: Former    Current packs/day: 0.00    Average packs/day: 1 pack/day for 20.0 years (20.0 ttl pk-yrs)    Types: Cigarettes    Start date: 11/28/1986    Quit date: 11/28/2006    Years since quitting: 17.5   Smokeless tobacco: Never  Vaping Use   Vaping status: Never Used  Substance and Sexual Activity   Alcohol use: Not Currently    Comment: rarely   Drug use: No   Sexual activity: Not Currently  Other Topics Concern   Not on file  Social  History Narrative   Lives at home with wife, not working disabled from the city of [place]   They have an adopted son at home (age 98  2022)   Former smoker no alcohol tobacco or drug use now   Social Drivers of Corporate Investment Banker Strain: Low Risk  (06/06/2024)   Overall Financial Resource Strain (CARDIA)  Difficulty of Paying Living Expenses: Not hard at all  Food Insecurity: No Food Insecurity (06/06/2024)   Hunger Vital Sign    Worried About Running Out of Food in the Last Year: Never true    Ran Out of Food in the Last Year: Never true  Transportation Needs: No Transportation Needs (06/06/2024)   PRAPARE - Administrator, Civil Service (Medical): No    Lack of Transportation (Non-Medical): No  Physical Activity: Insufficiently Active (06/06/2024)   Exercise Vital Sign    Days of Exercise per Week: 3 days    Minutes of Exercise per Session: 20 min  Stress: No Stress Concern Present (03/06/2024)   [employer] of Occupational Health - Occupational Stress Questionnaire    Feeling of Stress: Not at all  Social Connections: Unknown (06/06/2024)   Social Connection and Isolation Panel    Frequency of Communication with Friends and Family: More than three times a week    Frequency of Social Gatherings with Friends and Family: Patient declined    Attends Religious Services: More than 4 times per year    Active Member of [employer] or Organizations: Patient declined    Attends Banker Meetings: Not on file    Marital Status: Married  Intimate Partner Violence: Not At Risk (05/28/2024)   Humiliation, Afraid, Rape, and Kick questionnaire    Fear of Current or Ex-Partner: No    Emotionally Abused: No    Physically Abused: No    Sexually Abused: No   Family History  Problem Relation Age of Onset   Heart failure Mother 45       Died age 55   Congestive Heart Failure Mother    Heart attack Father 57       Died with MI   Heart attack Paternal Grandmother     Heart attack Paternal Grandfather    Colon cancer Neg Hx    Esophageal cancer Neg Hx    Pancreatic cancer Neg Hx    Stomach cancer Neg Hx     Objective: Office vital signs reviewed. BP 127/65   Pulse (!) 58   Temp 98 F (36.7 C)   Ht 5' 10 (1.778 m)   Wt 207 lb 8 oz (94.1 kg)   SpO2 98%   BMI 29.77 kg/m   Physical Examination:  General: Awake, alert, well nourished, No acute distress HEENT: sclera white, MMM Cardio: Bradycardic rate with regular rhythm, S1S2 heard, no murmurs appreciated Pulm: clear to auscultation bilaterally, no wheezes, rhonchi or rales; normal work of breathing on room air Chest: Healing postsurgical scar noted along the anterior midline.  No exudates or dehiscence appreciated. Extremities: Left upper extremity with healing postsurgical scar that extends along the entire ventral forearm.  No exudates or dehiscence MSK: Ambulating with use of cane  Assessment/ Plan: 63 y.o. male   Pernicious anemia - Plan: CBC with Differential  S/P CABG x 1 - Plan: Lipid Panel, CANCELED: Lipid Panel  Mixed hyperlipidemia - Plan: Lipid Panel, CANCELED: Lipid Panel  Elevated serum creatinine - Plan: Basic Metabolic Panel  Immunization due - Plan: Pneumococcal conjugate vaccine 20-valent (Prevnar 20)   Check CBC given drop in hemoglobin after hospitalization.  Lipid panel ordered will CC to cardiology, whom he will see tomorrow.  Wonder if maybe he might benefit from Repatha.  He seemed interested in this. Overall he appears to be healing well from his surgery  Check BMP given elevation in serum creatinine postop.  Pneumococcal vaccine given  today  May follow-up in 3 months, sooner if concerns arise   Norene CHRISTELLA Fielding, DO [facility] [phone]

## 2024-06-10 NOTE — Progress Notes (Unsigned)
 Cardiology Office Note    Date:  06/11/2024  ID:  Jeffrey Hooper, DOB [birth year], MRN [id number] PCP:  [name], DO  Cardiologist:  Lynwood Schilling, MD  Electrophysiologist:  None   Chief Complaint: Follow up for CAD  History of Present Illness: .   Jeffrey Hooper is a 63 y.o. male with visit-pertinent history of CAD s/p DES in 11/2015, s/p CABG x1 with left radial artery to PDA, Paroxysmal SVT, hypertension, hyperlipidemia, hypothyroidism, GERD and alpha gal allergy .  Patient underwent PCI with DES in 11/2015, per notes had multiple ischemic eval's.  LHC in 02/2024 showed patent LAD stent with CTO of proximal to mid RCA with excellent left-to-right collaterals.  CTO intervention was attempted on 03/19/2024 with Dr. Jordan but lesion was unable to be crossed with wire due to severe calcification.  Medical therapies recommended patient to start on Ranexa .  Patient was seen by Dr. Jordan on 9//25, was having shortness of breath and chest pain with exertion however symptoms had improved with Ranexa , Ranexa  was increased to 1000 mg twice daily.  On 04/24/2024 patient presented ED for further evaluation of chest pain shortness of breath.  EKG showed normal sinus rhythm with no acute ischemic changes.  Patient reported he been having almost constant chest pain that have been worsening since being seen by Dr. Jordan.  Patient was referred to CT surgery for consideration of single-vessel CABG.  On 05/21/2024 patient underwent CABG x 1 with left radial artery to PDA with Dr. Shyrl.  Patient did develop postoperative atrial fibrillation, managed with amiodarone protocol and converted to sinus rhythm.  Patient was discharged on 05/26/2024 in stable condition.  Today he presents for follow-up.  He reports that he has been doing very well overall.  He denies any significant chest pain, shortness of breath, lower extremity edema, orthopnea or PND.  He notes very rare jolts of chest discomfort near his  surgical site, not associate with exertion and are less than 5 seconds in duration, notes that this has been improving in recent weeks.  Patient reports that he has been feeling significantly better compared to prior to his surgery.  He reports that with discontinuation of metoprolol  his heart rate has improved although does stay typically in the low 50s, notes an occasional skipped beat or few seconds of fast heartbeats, denies any sustained palpitations or irregular heartbeats.  Patient has a home health nurse that has been assisting in monitoring him at home, notes that his blood pressure has been elevated with systolics in the 140s and 150s, does have some occasional improvement into the 120s and 130s in the afternoons.  Labwork independently reviewed: 06/10/2024: Hemoglobin 1.7, hematocrit 36.7, sodium 139, potassium 4.4, creatinine 1.2 ROS: .   Today he denies chest pain, shortness of breath, lower extremity edema, fatigue, palpitations, melena, hematuria, hemoptysis, diaphoresis, weakness, presyncope, syncope, orthopnea, and PND.  All other systems are reviewed and otherwise negative. Studies Reviewed: SABRA   EKG:  EKG is ordered today, personally reviewed, demonstrating  EKG Interpretation Date/Time:  Thursday June 11 2024 09:46:43 EST Ventricular Rate:  54 PR Interval:  202 QRS Duration:  98 QT Interval:  470 QTC Calculation: 445 R Axis:   61  Text Interpretation: Sinus bradycardia When compared with ECG of 23-May-2024 04:01, Sinus rhythm has replaced Atrial fibrillation Vent. rate has decreased BY  79 BPM ST less depressed in Anterior leads Confirmed by Jaidin Ugarte [phone]) on 06/11/2024 11:10:15 AM   CV Studies: Cardiac studies  reviewed are outlined and summarized above. Otherwise please see EMR for full report. Cardiac Studies & Procedures   ______________________________________________________________________________________________ CARDIAC CATHETERIZATION  CARDIAC CATHETERIZATION  03/19/2024  Conclusion   Mid LAD lesion is 30% stenosed.   Ost Cx to Prox Cx lesion is 50% stenosed.   Prox RCA to Mid RCA lesion is 100% stenosed.   Post intervention, there is a 100% residual stenosis.  Unsuccessful CTO PCI of the RCA. Unable to cross lesion with wire due to severe calcification   Plan: continue medical therapy. Will observe overnight.  Findings Coronary Findings Diagnostic  Dominance: Right  Left Anterior Descending Mid LAD lesion is 30% stenosed. The lesion was previously treated using a drug eluting stent over 2 years ago.  First Diagonal Branch Vessel is small in size.  Left Circumflex Ost Cx to Prox Cx lesion is 50% stenosed.  Right Coronary Artery Prox RCA to Mid RCA lesion is 100% stenosed. The lesion is chronically occluded. The lesion is severely calcified.  Right Posterior Descending Artery Collaterals RPDA filled by collaterals from 2nd Diag.  Intervention  Prox RCA to Mid RCA lesion Angioplasty CATH MACH1 8FR AL.75 90CM guide catheter was inserted. WIRE ASAHI CONFIANZA 300CM guidewire used to cross lesion. Post-Intervention Lesion Assessment The intervention was unsuccessful due to inability to cross the lesion with wire. Pre-interventional TIMI flow is 0. Post-intervention TIMI flow is 0. No complications occurred at this lesion. There is a 100% residual stenosis post intervention.   CARDIAC CATHETERIZATION  CARDIAC CATHETERIZATION 02/26/2024  Conclusion   Ost Cx to Prox Cx lesion is 50% stenosed.   Mid LAD lesion is 30% stenosed.   Prox RCA to Mid RCA lesion is 100% stenosed.   The left ventricular systolic function is normal.   LV end diastolic pressure is normal.   The left ventricular ejection fraction is 55-65% by visual estimate.  Single vessel occlusive CAD. Continued patency of stent in the LAD. The RCA is occluded in the mid vessel with excellent left to right collaterals from the second diagonal Normal LV function Normal  LVEDP  Plan: patient has not been on any anti-anginal therapy. Will add Toprol  XL 25 mg daily and Imdur  30 mg daily. Stop HCT to allow titration of medication. If he has refractory angina despite optimal medical therapy could consider CTO PCI.  Findings Coronary Findings Diagnostic  Dominance: Right  Left Anterior Descending Mid LAD lesion is 30% stenosed. The lesion was previously treated using a drug eluting stent over 2 years ago.  First Diagonal Branch Vessel is small in size.  Left Circumflex Ost Cx to Prox Cx lesion is 50% stenosed.  Right Coronary Artery Prox RCA to Mid RCA lesion is 100% stenosed. The lesion is chronically occluded. The lesion is severely calcified.  Right Posterior Descending Artery Collaterals RPDA filled by collaterals from 2nd Diag.  Intervention  No interventions have been documented.   STRESS TESTS  NM MYOCAR MULTI W/SPECT W 02/17/2024  Narrative   Inferior defect difficult to interpret in setting of adjacent gut radiotracer uptake. Findings suggest probable inferior infarct with mild to moderate peri-infarct ischemia. Overall low to intermediate risk findings.   No ST deviation was noted.   LV perfusion is abnormal. There is a moderate size moderate intensity inferior defect with mild to moderate reversibility. Interpretation is affected by significant adjacent gut radiotracer uptake.   Left ventricular function is normal. End diastolic cavity size is normal.   ECHOCARDIOGRAM  ECHOCARDIOGRAM COMPLETE 12/24/2023  Narrative ECHOCARDIOGRAM REPORT  Patient Name:   Jeffrey Hooper Date of Exam: 12/24/2023 Medical Rec #:  [id number]       Height:       70.0 in Accession #:    [id number]      Weight:       211.2 lb Date of Birth:  May 03, 1961       BSA:          2.136 m Patient Age:    62 years        BP:           135/68 mmHg Patient Gender: M               HR:           49 bpm. Exam Location:  Inpatient  Procedure: 2D Echo, Color Doppler  and Cardiac Doppler (Both Spectral and Color Flow Doppler were utilized during procedure).  Indications:    R94.31 Abnormal EKG  History:        Patient has prior history of Echocardiogram examinations, most recent 11/25/2015. CAD; Risk Factors:Hypertension and Dyslipidemia.  Sonographer:    Damien Senior RDCS Referring Phys: [id number] EARLENE CHRISTELLA CLUSTER  IMPRESSIONS   1. Left ventricular ejection fraction, by estimation, is 60 to 65%. The left ventricle has normal function. The left ventricle has no regional wall motion abnormalities. There is mild concentric left ventricular hypertrophy. Left ventricular diastolic parameters were normal. 2. Right ventricular systolic function is normal. The right ventricular size is mildly enlarged. Tricuspid regurgitation signal is inadequate for assessing PA pressure. 3. Left atrial size was mildly dilated. 4. The mitral valve is normal in structure. Trivial mitral valve regurgitation. 5. The aortic valve is tricuspid. Aortic valve regurgitation is not visualized. 6. There is borderline dilatation of the ascending aorta, measuring 39 mm. 7. The inferior vena cava is normal in size with greater than 50% respiratory variability, suggesting right atrial pressure of 3 mmHg.  Comparison(s): Prior images unable to be directly viewed, comparison made by report only.  FINDINGS Left Ventricle: Left ventricular ejection fraction, by estimation, is 60 to 65%. The left ventricle has normal function. The left ventricle has no regional wall motion abnormalities. The left ventricular internal cavity size was normal in size. There is mild concentric left ventricular hypertrophy. Left ventricular diastolic parameters were normal.  Right Ventricle: The right ventricular size is mildly enlarged. No increase in right ventricular wall thickness. Right ventricular systolic function is normal. Tricuspid regurgitation signal is inadequate for assessing PA pressure.  Left  Atrium: Left atrial size was mildly dilated.  Right Atrium: Right atrial size was normal in size.  Pericardium: Trivial pericardial effusion is present.  Mitral Valve: The mitral valve is normal in structure. Trivial mitral valve regurgitation.  Tricuspid Valve: The tricuspid valve is normal in structure. Tricuspid valve regurgitation is trivial.  Aortic Valve: The aortic valve is tricuspid. Aortic valve regurgitation is not visualized.  Pulmonic Valve: The pulmonic valve was normal in structure. Pulmonic valve regurgitation is trivial.  Aorta: The aortic root is normal in size and structure. Ascending aorta measurements are within normal limits for age when indexed to body surface area. There is borderline dilatation of the ascending aorta, measuring 39 mm.  Venous: The inferior vena cava is normal in size with greater than 50% respiratory variability, suggesting right atrial pressure of 3 mmHg.  IAS/Shunts: The atrial septum is grossly normal.   LEFT VENTRICLE PLAX 2D LVIDd:         4.50 cm  Diastology LVIDs:         3.20 cm   LV e' medial:    8.49 cm/s LV PW:         1.10 cm   LV E/e' medial:  9.3 LV IVS:        1.20 cm   LV e' lateral:   10.90 cm/s LVOT diam:     2.30 cm   LV E/e' lateral: 7.2 LV SV:         125 LV SV Index:   58 LVOT Area:     4.15 cm   RIGHT VENTRICLE RV S prime:     14.10 cm/s TAPSE (M-mode): 3.1 cm  LEFT ATRIUM             Index        RIGHT ATRIUM           Index LA diam:        4.00 cm 1.87 cm/m   RA Area:     19.00 cm LA Vol (A2C):   68.6 ml 32.11 ml/m  RA Volume:   55.20 ml  25.84 ml/m LA Vol (A4C):   65.8 ml 30.80 ml/m LA Biplane Vol: 69.3 ml 32.44 ml/m AORTIC VALVE LVOT Vmax:   130.00 cm/s LVOT Vmean:  92.400 cm/s LVOT VTI:    0.300 m  AORTA Ao Root diam: 3.40 cm Ao Asc diam:  3.90 cm  MITRAL VALVE MV Area (PHT): 3.07 cm    SHUNTS MV Decel Time: 247 msec    Systemic VTI:  0.30 m MV E velocity: 78.60 cm/s  Systemic Diam:  2.30 cm MV A velocity: 58.20 cm/s MV E/A ratio:  1.35  Mihai Croitoru MD Electronically signed by Jerel Balding MD Signature Date/Time: 12/24/2023/3:45:21 PM    Final   TEE  ECHO INTRAOPERATIVE TEE 05/21/2024  Narrative *INTRAOPERATIVE TRANSESOPHAGEAL REPORT *    Patient Name:   ARAVIND CHRISMER Date of Exam: 05/21/2024 Medical Rec #:  [id number]       Height:       70.0 in Accession #:    [id number]      Weight:       206.2 lb Date of Birth:  1961/06/20       BSA:          2.11 m Patient Age:    63 years        BP:           128/72 mmHg Patient Gender: M               HR:           55 bpm. Exam Location:  Inpatient  Transesophogeal exam was perform intraoperatively during surgical procedure. Patient was closely monitored under general anesthesia during the entirety of examination.  Indications:     CAD Native Vessel i25.10 Sonographer:     Damien Senior RDCS Performing Phys: [id number] LINNIE KIDD LIGHTFOOT Diagnosing Phys: Elsie Needle MD  Complications: No known complications during this procedure. POST-OP IMPRESSIONS Overall, there were no significant changes from pre-exam.  PRE-OP FINDINGS Left Ventricle: The left ventricle has normal systolic function, with an ejection fraction of 55-60%. The cavity size was normal. There is no increase in left ventricular wall thickness. No evidence of left ventricular regional wall motion abnormalities. There is no left ventricular hypertrophy.   Right Ventricle: The right ventricle has normal systolic function. The cavity was normal. There is no increase in right ventricular wall thickness.  Left Atrium: Left atrial size was not assessed. No left atrial/left atrial appendage thrombus was detected.  Right Atrium: Right atrial size was not assessed.  Interatrial Septum: No atrial level shunt detected by color flow Doppler.  Pericardium: There is no evidence of pericardial effusion.  Mitral Valve: The mitral valve is  normal in structure. Mitral valve regurgitation is trivial by color flow Doppler.  Tricuspid Valve: The tricuspid valve was normal in structure. Tricuspid valve regurgitation was not visualized by color flow Doppler.  Aortic Valve: The aortic valve is normal in structure. Aortic valve regurgitation is mild by color flow Doppler.   Pulmonic Valve: The pulmonic valve was normal in structure. Pulmonic valve regurgitation is trivial by color flow Doppler.    Elsie Needle MD Electronically signed by Elsie Needle MD Signature Date/Time: 05/21/2024/11:39:56 AM    Final  MONITORS  LONG TERM MONITOR-LIVE TELEMETRY (3-14 DAYS) 01/20/2024  Narrative Predominant rhythm normal sinus rhythm Occasional supraventricular tachycardia brief runs with the longest being 16 beats. Possible brief runs of atrial tachycardia. No sustained arrhythmias.       ______________________________________________________________________________________________       Current Reported Medications:.    Current Meds  Medication Sig   acetaminophen  (TYLENOL ) 500 MG tablet Take 1,000 mg by mouth every 8 (eight) hours as needed for moderate pain (pain score 4-6).   albuterol  (VENTOLIN  HFA) 108 (90 Base) MCG/ACT inhaler TAKE 2 PUFFS BY MOUTH EVERY 6 HOURS AS NEEDED FOR WHEEZE OR SHORTNESS OF BREATH   amiodarone (PACERONE) 200 MG tablet Take 1 tablet (200 mg total) by mouth 2 (two) times daily.   amLODipine  (NORVASC ) 2.5 MG tablet Take 1 tablet (2.5 mg total) by mouth daily.   aspirin  EC 325 MG tablet Take 1 tablet (325 mg total) by mouth daily.   atorvastatin  (LIPITOR ) 80 MG tablet TAKE 1 TABLET BY MOUTH EVERYDAY AT BEDTIME   cetirizine  (ZYRTEC ) 10 MG tablet Take 10 mg by mouth daily.   EPINEPHrine  0.3 mg/0.3 mL IJ SOAJ injection Inject 0.3 mg into the muscle as needed for anaphylaxis.   ezetimibe  (ZETIA ) 10 MG tablet Take 1 tablet (10 mg total) by mouth daily.   fluticasone  (FLONASE ) 50 MCG/ACT  nasal spray Place 2 sprays into both nostrils daily as needed for allergies or rhinitis.   levothyroxine  (SYNTHROID ) 100 MCG tablet Take 1 tablet (100 mcg total) by mouth daily.   MAGNESIUM  GLYCINATE PO Take 2 tablets by mouth at bedtime.   olmesartan  (BENICAR ) 20 MG tablet Take 1 tablet (20 mg total) by mouth daily.   ondansetron  (ZOFRAN -ODT) 4 MG disintegrating tablet TAKE 1 TABLET BY MOUTH EVERY 8 HOURS AS NEEDED FOR NAUSEA AND VOMITING   pantoprazole  (PROTONIX ) 40 MG tablet Take 40 mg by mouth 2 (two) times daily.   PARoxetine  (PAXIL ) 20 MG tablet Take 1 tablet (20 mg total) by mouth daily.   Current Facility-Administered Medications for the 06/11/24 encounter (Office Visit) with Sinia Antosh D, NP  Medication   cyanocobalamin  (VITAMIN B12) injection 1,000 mcg    Physical Exam:    VS:  BP (!) 146/84   Pulse (!) 56   Ht 5' 10.5 (1.791 m)   Wt 206 lb 3.2 oz (93.5 kg)   SpO2 99%   BMI 29.17 kg/m    Wt Readings from Last 3 Encounters:  06/11/24 206 lb 3.2 oz (93.5 kg)  06/10/24 207 lb 8 oz (94.1 kg)  05/26/24 205 lb 6.4 oz (93.2 kg)    GEN: Well nourished, well developed  in no acute distress NECK: No JVD; No carotid bruits CARDIAC: RRR, no murmurs, rubs, gallops RESPIRATORY:  Clear to auscultation without rales, wheezing or rhonchi  ABDOMEN: Soft, non-tender, non-distended EXTREMITIES:  No edema; No acute deformity     Asessement and Plan:.    CAD: History of DES to LAD in 2017.  LHC in 7/25 showed patent LAD stent with CTO of proximal to mid RCA with excellent left-to-right collaterals.  CTO intervention was attempted on 03/19/2024 with Dr. Jordan but lesion was unable to be crossed with wire due to severe calcification.  Medical therapy is recommended however patient had persistent chest pain.  Patient underwent CABG x 1 with left radial artery PDA with Dr. Shyrl on 05/21/2024, postoperative.  Overall uneventful, patient did have some postoperative atrial fibrillation and  loaded on amiodarone. Today he reports that he is doing well, reports that he is feeling significantly better compared to pre-bypass, denies any of his previous chest pain, denies any significant shortness of breath.  He does note an occasional jolt sensation near his surgical site, likely nerve or musculoskeletal in nature, lasts only few seconds then resolves.  Notes this has been improving recently.  He appears euvolemic and well compensated on exam, surgical sites are clean and intact without evidence of infection.  Will defer starting of cardiac rehab to T[facility].  Lab work completed yesterday is stable.  Reviewed ED precautions.  Continue amiodarone 200 mg daily, amlodipine  2.5 mg daily, aspirin  325 mg daily, Lipitor  80 mg daily, Zetia  10 mg daily.  Postop A-fib: Following CABG x 1 patient had postoperative atrial fibrillation, started on amiodarone with conversion to sinus rhythm. Following hospital discharge patient had  bradycardia, metoprolol  was discontinued. EKG today indicates patient is in sinus bradycardia at 54 bpm.  Patient notes that his heart rate at home has consistently been in the low 50s, has noted some improvement with discontinuation of metoprolol .  Will decrease his amiodarone to 200 mg daily.  Patient denies any sustained palpitations, notes an occasional feeling of increased heart rates that is lasting only a few seconds then resolves.  He will notify the office if more consistent palpitations.  Start amiodarone 200 mg daily.  HTN: Blood pressure today 156/72, on recheck was 146/84.  Continue amlodipine  2.5 mg daily.  Start olmesartan  20 mg daily.  Patient will continue to monitor his blood pressures at home.  Check basic metabolic profile in 2 weeks.  Hyperlipidemia: Last lipid profile on 06/10/2024 indicated total cholesterol 119, HDL 52, triglycerides 89 and LDL 50.  Continue Lipitor  80 mg daily and Zetia  10 mg daily.    Cardiac Rehabilitation Eligibility Assessment  The patient  is NOT ready to start cardiac rehabilitation due to: -- (Defer to TCTS)   Disposition: F/u with Dr. Jordan in 07/2024 as scheduled.   Signed, Breeze Angell D Anes Rigel, NP

## 2024-06-11 ENCOUNTER — Ambulatory Visit: Attending: Cardiology | Admitting: Cardiology

## 2024-06-11 ENCOUNTER — Ambulatory Visit: Payer: Self-pay | Admitting: Cardiology

## 2024-06-11 ENCOUNTER — Encounter: Payer: Self-pay | Admitting: Cardiology

## 2024-06-11 VITALS — BP 146/84 | HR 56 | Ht 70.5 in | Wt 206.2 lb

## 2024-06-11 DIAGNOSIS — Z79899 Other long term (current) drug therapy: Secondary | ICD-10-CM | POA: Insufficient documentation

## 2024-06-11 DIAGNOSIS — Z951 Presence of aortocoronary bypass graft: Secondary | ICD-10-CM | POA: Diagnosis present

## 2024-06-11 DIAGNOSIS — I2510 Atherosclerotic heart disease of native coronary artery without angina pectoris: Secondary | ICD-10-CM | POA: Insufficient documentation

## 2024-06-11 DIAGNOSIS — I10 Essential (primary) hypertension: Secondary | ICD-10-CM | POA: Insufficient documentation

## 2024-06-11 DIAGNOSIS — Z9861 Coronary angioplasty status: Secondary | ICD-10-CM | POA: Insufficient documentation

## 2024-06-11 DIAGNOSIS — E785 Hyperlipidemia, unspecified: Secondary | ICD-10-CM | POA: Diagnosis present

## 2024-06-11 LAB — CBC WITH DIFFERENTIAL/PLATELET
Basophils Absolute: 0.1 x10E3/uL (ref 0.0–0.2)
Basos: 1 %
EOS (ABSOLUTE): 0.6 x10E3/uL — ABNORMAL HIGH (ref 0.0–0.4)
Eos: 9 %
Hematocrit: 36.7 % — ABNORMAL LOW (ref 37.5–51.0)
Hemoglobin: 11.7 g/dL — ABNORMAL LOW (ref 13.0–17.7)
Immature Grans (Abs): 0 x10E3/uL (ref 0.0–0.1)
Immature Granulocytes: 0 %
Lymphocytes Absolute: 1.4 x10E3/uL (ref 0.7–3.1)
Lymphs: 21 %
MCH: 30 pg (ref 26.6–33.0)
MCHC: 31.9 g/dL (ref 31.5–35.7)
MCV: 94 fL (ref 79–97)
Monocytes Absolute: 0.6 x10E3/uL (ref 0.1–0.9)
Monocytes: 8 %
Neutrophils Absolute: 4.3 x10E3/uL (ref 1.4–7.0)
Neutrophils: 61 %
Platelets: 409 x10E3/uL (ref 150–450)
RBC: 3.9 x10E6/uL — ABNORMAL LOW (ref 4.14–5.80)
RDW: 12.6 % (ref 11.6–15.4)
WBC: 7 x10E3/uL (ref 3.4–10.8)

## 2024-06-11 LAB — BASIC METABOLIC PANEL WITH GFR
BUN/Creatinine Ratio: 13 (ref 10–24)
BUN: 15 mg/dL (ref 8–27)
CO2: 24 mmol/L (ref 20–29)
Calcium: 9.2 mg/dL (ref 8.6–10.2)
Chloride: 101 mmol/L (ref 96–106)
Creatinine, Ser: 1.2 mg/dL (ref 0.76–1.27)
Glucose: 179 mg/dL — AB (ref 70–99)
Potassium: 4.4 mmol/L (ref 3.5–5.2)
Sodium: 139 mmol/L (ref 134–144)
eGFR: 68 mL/min/1.73 (ref >59)

## 2024-06-11 LAB — LIPID PANEL
Cholesterol, Total: 119 mg/dL (ref 100–199)
HDL: 52 mg/dL (ref >39)
LDL CALC COMMENT:: 2.3 ratio (ref 0.0–5.0)
LDL Chol Calc (NIH): 50 mg/dL (ref 0–99)
Triglycerides: 89 mg/dL (ref 0–149)
VLDL Cholesterol Cal: 17 mg/dL (ref 5–40)

## 2024-06-11 MED ORDER — OLMESARTAN MEDOXOMIL 20 MG PO TABS: 20.0000 mg | ORAL_TABLET | Freq: Every day | ORAL | 3 refills | Status: DC

## 2024-06-11 NOTE — Patient Instructions (Signed)
 Medication Instructions:  START taking Olmesartan  (Benicar ) 20 mg, take one (1) tablet by mouth daily in the morning  DECREASE Amiodarone (Pacerone) to 200 mg, take one (1) tablet by mouth daily in the morning  CHANGE taking your Amlodipine  (Norvasc ) 2.5 mg, take one (1) tablet by mouth daily before bed *If you need a refill on your cardiac medications before your next appointment, please call your pharmacy*  Lab Work: BMET in 2 weeks for Medication Management  If you have labs (blood work) drawn today and your tests are completely normal, you will receive your results only by: MyChart Message (if you have MyChart) OR A paper copy in the mail If you have any lab test that is abnormal or we need to change your treatment, we will call you to review the results.  Testing/Procedures: None ordered  Follow-Up: At [facility], you and your health needs are our priority.  As part of our continuing mission to provide you with exceptional heart care, our providers are all part of one team.  This team includes your primary Cardiologist (physician) and Advanced Practice Providers or APPs (Physician Assistants and Nurse Practitioners) who all work together to provide you with the care you need, when you need it.  Your next appointment:   Keep your follow-up appointment with Dr. Jordan on 07/09/24 at 8:20 am.    We recommend signing up for the patient portal called MyChart.  Sign up information is provided on this After Visit Summary.  MyChart is used to connect with patients for Virtual Visits (Telemedicine).  Patients are able to view lab/test results, encounter notes, upcoming appointments, etc.  Non-urgent messages can be sent to your provider as well.   To learn more about what you can do with MyChart, go to [email or web address].   Other Instructions Your provider has requested that you regularly monitor and record your blood pressure readings at home. Please use the same machine  at the same time of day to check your readings and record them to bring to your follow-up visit.

## 2024-06-17 ENCOUNTER — Other Ambulatory Visit: Payer: Self-pay | Admitting: Surgical

## 2024-06-26 ENCOUNTER — Ambulatory Visit (INDEPENDENT_AMBULATORY_CARE_PROVIDER_SITE_OTHER): Payer: Self-pay

## 2024-06-26 ENCOUNTER — Ambulatory Visit
Admission: RE | Admit: 2024-06-26 | Discharge: 2024-06-26 | Disposition: A | Discharge status: Home / Self Care | Payer: Self-pay | Source: Ambulatory Visit | Attending: Thoracic Surgery (Cardiothoracic Vascular Surgery) | Admitting: Thoracic Surgery (Cardiothoracic Vascular Surgery)

## 2024-06-26 ENCOUNTER — Other Ambulatory Visit: Payer: Self-pay | Admitting: Thoracic Surgery (Cardiothoracic Vascular Surgery)

## 2024-06-26 VITALS — BP 151/75 | HR 56 | Resp 18 | Ht 70.0 in | Wt 210.0 lb

## 2024-06-26 DIAGNOSIS — Z951 Presence of aortocoronary bypass graft: Secondary | ICD-10-CM | POA: Insufficient documentation

## 2024-06-26 LAB — BASIC METABOLIC PANEL WITH GFR
BUN/Creatinine Ratio: 13 (ref 10–24)
BUN: 16 mg/dL (ref 8–27)
CO2: 24 mmol/L (ref 20–29)
Calcium: 9.4 mg/dL (ref 8.6–10.2)
Chloride: 104 mmol/L (ref 96–106)
Creatinine, Ser: 1.21 mg/dL (ref 0.76–1.27)
Glucose: 82 mg/dL (ref 70–99)
Potassium: 4.2 mmol/L (ref 3.5–5.2)
Sodium: 142 mmol/L (ref 134–144)
eGFR: 67 mL/min/1.73 (ref >59)

## 2024-06-26 NOTE — Patient Instructions (Signed)

## 2024-06-26 NOTE — Progress Notes (Signed)
 [address] [place] [address]             [phone]       HPI:  Patient returns for routine postoperative follow-up having undergone Off pump CABG X 1, Left radial to PDA and open harvest of the left radial artery on 05/21/2024 with Dr. Shyrl.  The patient's early postoperative recovery while in the hospital was notable for being started on amlodipine  for radial artery vasodilator management. He was routinely diuresed. He developed atrial fibrillation and was treated with amiodarone . He converted back to normal sinus rhythm. He was stable for discharge home on 05/26/2024.  Since hospital discharge the patient reports that he has been doing well.  He still has some incisional chest pain that he describes as pressure along his sternum.  It is not constant and comes randomly, it is not associated with movement or activity.  The pain lasts a couple of seconds and does not require medications. He has been active and walking for activity. Incision sites have been healing appropriately. He denies shortness of breath and lower leg edema.    Allergies as of 06/26/2024       Reactions   Alpha-gal Rash, Other (See Comments)   SEVERE ALLERGY  TO ANY MEAT OR MEAT DERIVED PRODUCTS FROM 4 LEGGED ANIMALS > > BEEF, PORK , GOATS, DEER, ETC. < < RESULT OF BITE FROM LONE STAR TICK   Bovine (beef) Protein Rash, Other (See Comments)   Bovine (beef) Protein-containing Drug Products Rash, Other (See Comments)   Lambs Quarters Rash, Other (See Comments)   Porcine (pork) Protein-containing Drug Products Rash, Other (See Comments)   Prednisone  Other (See Comments)   im depo medrol  caused dizziness - pt passed out. Ask pt before giving. Pt can take oral prednisone     Brilinta  [ticagrelor ] Hives   Pt ate pork on the same day he took Brilinta  before knowing he had alpha gal. May have been the alpha gal reaction, pt is unsure.    Lyrica [pregabalin]    Dry mouth and felt raw    Methylprednisolone  Sodium Succ    im depo medrol  caused dizziness - pt passed out. Ask pt before giving. Issue occurred when administered in the left arm    Penicillins Hives   Has tolerated since   Shellfish Allergy  Hives   Adhesive [tape] Rash   Doxycycline Hives, Swelling, Rash   Fentanyl  Rash   Reaction to adhesive, not the drug         Medication List        Accurate as of June 26, 2024 12:51 PM. If you have any questions, ask your nurse or doctor.          acetaminophen  500 MG tablet Commonly known as: TYLENOL  Take 1,000 mg by mouth every 8 (eight) hours as needed for moderate pain (pain score 4-6).   albuterol  108 (90 Base) MCG/ACT inhaler Commonly known as: Ventolin  HFA TAKE 2 PUFFS BY MOUTH EVERY 6 HOURS AS NEEDED FOR WHEEZE OR SHORTNESS OF BREATH   amiodarone  200 MG tablet Commonly known as: PACERONE  Take 1 tablet (200 mg total) by mouth 2 (two) times daily.   amLODipine  2.5 MG tablet Commonly known as: NORVASC  Take 1 tablet (2.5 mg total) by mouth daily.   aspirin  EC 325 MG tablet Take 1 tablet (325 mg total) by mouth daily.   atorvastatin  80 MG tablet Commonly known as: LIPITOR  TAKE 1 TABLET  BY MOUTH EVERYDAY AT BEDTIME   cetirizine  10 MG tablet Commonly known as: ZYRTEC  Take 10 mg by mouth daily.   EPINEPHrine  0.3 mg/0.3 mL Soaj injection Commonly known as: EPI-PEN Inject 0.3 mg into the muscle as needed for anaphylaxis.   ezetimibe  10 MG tablet Commonly known as: ZETIA  Take 1 tablet (10 mg total) by mouth daily.   fluticasone  50 MCG/ACT nasal spray Commonly known as: FLONASE  Place 2 sprays into both nostrils daily as needed for allergies or rhinitis.   levothyroxine  100 MCG tablet Commonly known as: SYNTHROID  Take 1 tablet (100 mcg total) by mouth daily.   MAGNESIUM  GLYCINATE PO Take 2 tablets by mouth at bedtime.   olmesartan  20 MG tablet Commonly known as: BENICAR  Take 1 tablet (20 mg total) by mouth daily.   ondansetron  4  MG disintegrating tablet Commonly known as: ZOFRAN -ODT TAKE 1 TABLET BY MOUTH EVERY 8 HOURS AS NEEDED FOR NAUSEA AND VOMITING   pantoprazole  40 MG tablet Commonly known as: PROTONIX  Take 40 mg by mouth 2 (two) times daily.   PARoxetine  20 MG tablet Commonly known as: PAXIL  Take 1 tablet (20 mg total) by mouth daily.         ROS  Review of Systems  Constitutional: Negative.   Respiratory: Negative.  Negative for cough and shortness of breath.   Cardiovascular: Negative.  Negative for chest pain, palpitations and leg swelling.     BP (!) 151/75 (BP Location: Right Arm)   Pulse (!) 56   Resp 18   Ht 5' 10 (1.778 m)   Wt 210 lb (95.3 kg)   SpO2 96%   BMI 30.13 kg/m   Physical Exam Constitutional:      Appearance: Normal appearance.  HENT:     Head: Normocephalic and atraumatic.  Cardiovascular:     Rate and Rhythm: Normal rate and regular rhythm.     Heart sounds: Normal heart sounds, S1 normal and S2 normal.  Pulmonary:     Effort: Pulmonary effort is normal.     Breath sounds: Normal breath sounds.  Skin:    General: Skin is warm and dry.      Neurological:     General: No focal deficit present.     Mental Status: He is alert and oriented to person, place, and time.      Imaging: EXAM: 2 VIEW(S) XRAY OF THE CHEST 06/26/2024 11:29:00 AM   COMPARISON: 05/23/2024   CLINICAL HISTORY: s/p cabg   FINDINGS:   LINES, TUBES AND DEVICES: Neurostimulator lead in place with tip at mid thoracic spine.   LUNGS AND PLEURA: No focal pulmonary opacity. No pleural effusion. No pneumothorax.   HEART AND MEDIASTINUM: Sternotomy noted. No acute abnormality of the cardiac and mediastinal silhouettes.   BONES AND SOFT TISSUES: Fixation hardware in lower cervical spine. Multilevel thoracic osteophytosis. No acute osseous abnormality.   IMPRESSION: 1. No acute cardiopulmonary process identified. 2. Aortic Atherosclerosis (ICD10-I70.0).   Electronically  signed by: Ryan Chess MD 06/26/2024 11:54 AM EST RP Workstation: [id number]    Assessment/Plan:  S/P CABG x 1 -We reviewed today's chest x ray.  -We discussed driving and he is able to start. First time driving should be a short distance in the day time and he can increase from there -Discussed participation in cardiac rehab and he is cleared to start at this time -Discussed continuance of sternal precautions until a full 6 weeks from surgery. Continue to avoid lifting over 10 pounds until a full 3  months from surgery -He should increase his exercise/activity as tolerated -He has been seen by cardiology and he is to continue amiodarone  200 mg daily, amlodipine  2.5 mg daily, aspirin  325 mg daily, Lipitor  80 mg daily, olmesartan  20 mg, Zetia  10 mg daily.   -Follow up as needed with [facility]  Manuelita CHRISTELLA Rough, PA-C 12:51 PM 06/26/24

## 2024-06-27 ENCOUNTER — Other Ambulatory Visit: Payer: Self-pay | Admitting: Emergency Medicine

## 2024-06-29 ENCOUNTER — Encounter (HOSPITAL_COMMUNITY)
Admission: RE | Admit: 2024-06-29 | Discharge: 2024-06-29 | Disposition: A | Discharge status: Home / Self Care | Source: Ambulatory Visit | Attending: Cardiology | Admitting: Cardiology

## 2024-06-29 ENCOUNTER — Ambulatory Visit: Payer: Self-pay | Admitting: Cardiology

## 2024-06-29 VITALS — BP 130/66 | HR 56 | Ht 69.0 in | Wt 209.0 lb

## 2024-06-29 DIAGNOSIS — Z951 Presence of aortocoronary bypass graft: Secondary | ICD-10-CM | POA: Insufficient documentation

## 2024-06-29 NOTE — Patient Instructions (Signed)
 Patient Instructions  Patient Details  Name: Jeffrey Hooper MRN: [id number] Date of Birth: Dec 16, 1960 Referring Provider:  Lavona Agent, MD  Below are your personal goals for exercise, nutrition, and risk factors. Our goal is to help you stay on track towards obtaining and maintaining these goals. We will be discussing your progress on these goals with you throughout the program.  Initial Exercise Prescription:  Initial Exercise Prescription - 06/29/24 0900       Date of Initial Exercise RX and Referring Provider   Date 06/29/24    Referring Provider Jordan, Peter MD      NuStep   Level 2    SPM 50    Minutes 15    METs 1.9      Arm Ergometer   Level 1    RPM 50    Minutes 15    METs 1.9      Prescription Details   Frequency (times per week) 3    Duration Progress to 30 minutes of continuous aerobic without signs/symptoms of physical distress      Intensity   THRR 40-80% of Max Heartrate 96-137    Ratings of Perceived Exertion 11-13    Perceived Dyspnea 0-4      Resistance Training   Training Prescription Yes    Weight 4    Reps 10-15          Exercise Goals: Frequency: Be able to perform aerobic exercise two to three times per week in program working toward 2-5 days per week of home exercise.  Intensity: Work with a perceived exertion of 11 (fairly light) - 15 (hard) while following your exercise prescription.  We will make changes to your prescription with you as you progress through the program.   Duration: Be able to do 30 to 45 minutes of continuous aerobic exercise in addition to a 5 minute warm-up and a 5 minute cool-down routine.   Nutrition Goals: Your personal nutrition goals will be established when you do your nutrition analysis with the dietician.  The following are general nutrition guidelines to follow: Cholesterol < 200mg /day Sodium < 1500mg /day Fiber: Men over 50 yrs - 30 grams per day  Personal Goals:  Personal Goals and Risk Factors  at Admission - 06/29/24 0835       Core Components/Risk Factors/Patient Goals on Admission    Weight Management Weight Maintenance    Improve shortness of breath with ADL's Yes    Intervention Provide education, individualized exercise plan and daily activity instruction to help decrease symptoms of SOB with activities of daily living.    Expected Outcomes Short Term: Improve cardiorespiratory fitness to achieve a reduction of symptoms when performing ADLs;Long Term: Be able to perform more ADLs without symptoms or delay the onset of symptoms    Hypertension Yes    Intervention Provide education on lifestyle modifcations including regular physical activity/exercise, weight management, moderate sodium restriction and increased consumption of fresh fruit, vegetables, and low fat dairy, alcohol moderation, and smoking cessation.;Monitor prescription use compliance.    Expected Outcomes Short Term: Continued assessment and intervention until BP is < 140/51mm HG in hypertensive participants. < 130/26mm HG in hypertensive participants with diabetes, heart failure or chronic kidney disease.;Long Term: Maintenance of blood pressure at goal levels.    Lipids Yes    Intervention Provide education and support for participant on nutrition & aerobic/resistive exercise along with prescribed medications to achieve LDL 70mg , HDL >40mg .    Expected Outcomes Short Term: Participant states understanding  of desired cholesterol values and is compliant with medications prescribed. Participant is following exercise prescription and nutrition guidelines.;Long Term: Cholesterol controlled with medications as prescribed, with individualized exercise RX and with personalized nutrition plan. Value goals: LDL < 70mg , HDL > 40 mg.          Tobacco Use Initial Evaluation: Social History   Tobacco Use  Smoking Status Former   Current packs/day: 0.00   Average packs/day: 1 pack/day for 20.0 years (20.0 ttl pk-yrs)    Types: Cigarettes   Start date: 11/28/1986   Quit date: 11/28/2006   Years since quitting: 17.5  Smokeless Tobacco Never    Exercise Goals and Review:  Exercise Goals     Row Name 06/29/24 0934             Exercise Goals   Increase Physical Activity Yes       Intervention Provide advice, education, support and counseling about physical activity/exercise needs.;Develop an individualized exercise prescription for aerobic and resistive training based on initial evaluation findings, risk stratification, comorbidities and participant's personal goals.       Expected Outcomes Short Term: Attend rehab on a regular basis to increase amount of physical activity.;Long Term: Add in home exercise to make exercise part of routine and to increase amount of physical activity.;Long Term: Exercising regularly at least 3-5 days a week.       Increase Strength and Stamina Yes       Intervention Provide advice, education, support and counseling about physical activity/exercise needs.;Develop an individualized exercise prescription for aerobic and resistive training based on initial evaluation findings, risk stratification, comorbidities and participant's personal goals.       Expected Outcomes Short Term: Increase workloads from initial exercise prescription for resistance, speed, and METs.;Short Term: Perform resistance training exercises routinely during rehab and add in resistance training at home;Long Term: Improve cardiorespiratory fitness, muscular endurance and strength as measured by increased METs and functional capacity ( )       Able to understand and use rate of perceived exertion (RPE) scale Yes       Intervention Provide education and explanation on how to use RPE scale       Expected Outcomes Short Term: Able to use RPE daily in rehab to express subjective intensity level;Long Term:  Able to use RPE to guide intensity level when exercising independently       Able to understand and use Dyspnea  scale Yes       Intervention Provide education and explanation on how to use Dyspnea scale       Expected Outcomes Short Term: Able to use Dyspnea scale daily in rehab to express subjective sense of shortness of breath during exertion;Long Term: Able to use Dyspnea scale to guide intensity level when exercising independently       Knowledge and understanding of Target Heart Rate Range (THRR) Yes       Intervention Provide education and explanation of THRR including how the numbers were predicted and where they are located for reference       Expected Outcomes Short Term: Able to state/look up THRR;Long Term: Able to use THRR to govern intensity when exercising independently;Short Term: Able to use daily as guideline for intensity in rehab       Able to check pulse independently Yes       Intervention Provide education and demonstration on how to check pulse in carotid and radial arteries.;Review the importance of being able to check your own pulse  for safety during independent exercise       Expected Outcomes Short Term: Able to explain why pulse checking is important during independent exercise;Long Term: Able to check pulse independently and accurately       Understanding of Exercise Prescription Yes       Intervention Provide education, explanation, and written materials on patient's individual exercise prescription       Expected Outcomes Short Term: Able to explain program exercise prescription;Long Term: Able to explain home exercise prescription to exercise independently          Copy of goals given to participant.

## 2024-06-29 NOTE — Progress Notes (Signed)
 Cardiac Individual Treatment Plan  Patient Details  Name: Jeffrey Hooper MRN: [id number] Date of Birth: 1960-12-03 Referring Provider:   Flowsheet Row CARDIAC REHAB PHASE II ORIENTATION from 06/29/2024 in [facility]  Referring Provider Jordan, Peter MD    Initial Encounter Date:  Flowsheet Row CARDIAC REHAB PHASE II ORIENTATION from 06/29/2024 in [facility]  Date 06/29/24    Visit Diagnosis: S/P CABG x 1  Patient's Home Medications on Admission:  Current Outpatient Medications:    acetaminophen  (TYLENOL ) 500 MG tablet, Take 1,000 mg by mouth every 8 (eight) hours as needed for moderate pain (pain score 4-6)., Disp: , Rfl:    albuterol  (VENTOLIN  HFA) 108 (90 Base) MCG/ACT inhaler, TAKE 2 PUFFS BY MOUTH EVERY 6 HOURS AS NEEDED FOR WHEEZE OR SHORTNESS OF BREATH, Disp: 18 each, Rfl: 1   amiodarone  (PACERONE ) 200 MG tablet, Take 1 tablet (200 mg total) by mouth 2 (two) times daily. (Patient taking differently: Take 200 mg by mouth daily.), Disp: 60 tablet, Rfl: 1   amLODipine  (NORVASC ) 2.5 MG tablet, Take 1 tablet (2.5 mg total) by mouth daily., Disp: 30 tablet, Rfl: 1   aspirin  EC 325 MG tablet, Take 1 tablet (325 mg total) by mouth daily., Disp: , Rfl:    atorvastatin  (LIPITOR ) 80 MG tablet, TAKE 1 TABLET BY MOUTH EVERYDAY AT BEDTIME, Disp: 90 tablet, Rfl: 3   cetirizine  (ZYRTEC ) 10 MG tablet, Take 10 mg by mouth daily., Disp: , Rfl:    EPINEPHrine  0.3 mg/0.3 mL IJ SOAJ injection, Inject 0.3 mg into the muscle as needed for anaphylaxis., Disp: 2 each, Rfl: 0   ezetimibe  (ZETIA ) 10 MG tablet, Take 1 tablet (10 mg total) by mouth daily., Disp: 90 tablet, Rfl: 3   fluticasone  (FLONASE ) 50 MCG/ACT nasal spray, Place 2 sprays into both nostrils daily as needed for allergies or rhinitis., Disp: , Rfl:    levothyroxine  (SYNTHROID ) 100 MCG tablet, Take 1 tablet (100 mcg total) by mouth daily., Disp: 90 tablet, Rfl: 3   MAGNESIUM  GLYCINATE PO, Take 2  tablets by mouth at bedtime., Disp: , Rfl:    olmesartan  (BENICAR ) 20 MG tablet, Take 1 tablet (20 mg total) by mouth daily., Disp: 90 tablet, Rfl: 3   ondansetron  (ZOFRAN -ODT) 4 MG disintegrating tablet, TAKE 1 TABLET BY MOUTH EVERY 8 HOURS AS NEEDED FOR NAUSEA AND VOMITING, Disp: 20 tablet, Rfl: 0   pantoprazole  (PROTONIX ) 40 MG tablet, Take 40 mg by mouth 2 (two) times daily., Disp: , Rfl:    PARoxetine  (PAXIL ) 20 MG tablet, Take 1 tablet (20 mg total) by mouth daily., Disp: 90 tablet, Rfl: 3  Current Facility-Administered Medications:    cyanocobalamin  (VITAMIN B12) injection 1,000 mcg, 1,000 mcg, Intramuscular, Q30 days, Gottschalk, Ashly M, DO, 1,000 mcg at 06/10/24 1143  Past Medical History: Past Medical History:  Diagnosis Date   Abnormal nuclear stress test 12/02/2015   Allergy     Allergy  to alpha-gal    Arthritis    CAD (coronary artery disease), native coronary artery 12/03/2015   Coronary artery disease    Difficult intubation    anterior larynx and limited oral opening, glidescope used electively   Dysrhythmia    PACs and SVT   GERD (gastroesophageal reflux disease)    Head injury with loss of consciousness (HCC)    back in the 1980's   History of hiatal hernia    History of kidney stones    Hyperlipidemia    Hypertension    Hypothyroid  Multiple benign nevi    Neuromuscular disorder (HCC) 2011   LEFT RADIAL NERVE SURGERY R/T TRAUMA   Pneumonia 2014, 2017   Prostatism    Pulmonary nodule    Spinal cord stimulator status    told to bring remote to surgery    Tobacco Use: Social History   Tobacco Use  Smoking Status Former   Current packs/day: 0.00   Average packs/day: 1 pack/day for 20.0 years (20.0 ttl pk-yrs)   Types: Cigarettes   Start date: 11/28/1986   Quit date: 11/28/2006   Years since quitting: 17.5  Smokeless Tobacco Never    Labs: Review Flowsheet  More data exists      Latest Ref Rng & Units 08/30/2023 12/23/2023 05/19/2024 05/21/2024  06/10/2024  Labs for ITP Cardiac and Pulmonary Rehab  Cholestrol 100 - 199 mg/dL 854  857  - - 880   LDL (calc) 0 - 99 mg/dL 73  69  - - 50   HDL-C >39 mg/dL 45  46  - - 52   Trlycerides 0 - 149 mg/dL 843  866  - - 89   Hemoglobin A1c 4.8 - 5.6 % 5.7  - 5.8  - -  PH, Arterial 7.35 - 7.45 - - - 7.352  7.367  7.460  7.439  7.418  7.355  -  PCO2 arterial 32 - 48 mmHg - - - 37.8  36.4  32.0  32.1  33.4  41.9  -  Bicarbonate 20.0 - 28.0 mmol/L - - - 21.0  21.0  23.4  21.7  21.5  23.4  -  TCO2 22 - 32 mmol/L - - - 22  22  25  23  22  23  22  25  25   -  Acid-base deficit 0.0 - 2.0 mmol/L - - - 4.0  4.0  1.0  2.0  2.0  2.0  -  O2 Saturation % - - - 97  98  97  100  100  100  -    Details       Multiple values from one day are sorted in reverse-chronological order          Exercise Target Goals: Exercise Program Goal: Individual exercise prescription set using results from initial 6 min walk test and THRR while considering  patient's activity barriers and safety.   Exercise Prescription Goal: Initial exercise prescription builds to 30-45 minutes a day of aerobic activity, 2-3 days per week.  Home exercise guidelines will be given to patient during program as part of exercise prescription that the participant will acknowledge.   Education: Aerobic Exercise: - Group verbal and visual presentation on the components of exercise prescription. Introduces F.I.T.T principle from ACSM for exercise prescriptions.  Reviews F.I.T.T. principles of aerobic exercise including progression. Written material provided at class time.   Education: Resistance Exercise: - Group verbal and visual presentation on the components of exercise prescription. Introduces F.I.T.T principle from ACSM for exercise prescriptions  Reviews F.I.T.T. principles of resistance exercise including progression. Written material provided at class time.    Education: Exercise & Equipment Safety: - Individual verbal instruction and  demonstration of equipment use and safety with use of the equipment.   Education: Exercise Physiology & General Exercise Guidelines: - Group verbal and written instruction with models to review the exercise physiology of the cardiovascular system and associated critical values. Provides general exercise guidelines with specific guidelines to those with heart or lung disease. Written material provided at class time. Flowsheet Row  CARDIAC REHAB PHASE II ORIENTATION from 06/29/2024 in [facility]  Education need identified 06/29/24    Education: Flexibility, Balance, Mind/Body Relaxation: - Group verbal and visual presentation with interactive activity on the components of exercise prescription. Introduces F.I.T.T principle from ACSM for exercise prescriptions. Reviews F.I.T.T. principles of flexibility and balance exercise training including progression. Also discusses the mind body connection.  Reviews various relaxation techniques to help reduce and manage stress (i.e. Deep breathing, progressive muscle relaxation, and visualization). Balance handout provided to take home. Written material provided at class time.   Activity Barriers & Risk Stratification:  Activity Barriers & Cardiac Risk Stratification - 06/29/24 0758       Activity Barriers & Cardiac Risk Stratification   Activity Barriers Neck/Spine Problems;Back Problems;Assistive Device;Shortness of Breath;Arthritis   Patient reports several fused disc in cervical and lumbar areas with chronic pain. He has pins in hips due to degenerative OA. He uses a straight cane for ambulation.   Cardiac Risk Stratification High          6 Minute Walk:  6 Minute Walk     Row Name 06/29/24 0932         6 Minute Walk   Phase Initial     Distance 1000 feet     Walk Time 6 minutes     # of Rest Breaks 0     MPH 1.9     METS 2.4     RPE 13     Perceived Dyspnea  1     VO2 Peak 8.4     Symptoms No     Resting HR 56  bpm     Resting BP 130/66     Resting Oxygen Saturation  97 %     Exercise Oxygen Saturation  during 6 min walk 97 %     Max Ex. HR 76 bpm     Max Ex. BP 140/60     2 Minute Post BP 132/60        Oxygen Initial Assessment:   Oxygen Re-Evaluation:   Oxygen Discharge (Final Oxygen Re-Evaluation):   Initial Exercise Prescription:  Initial Exercise Prescription - 06/29/24 0900       Date of Initial Exercise RX and Referring Provider   Date 06/29/24    Referring Provider Jordan, Peter MD      NuStep   Level 2    SPM 50    Minutes 15    METs 1.9      Arm Ergometer   Level 1    RPM 50    Minutes 15    METs 1.9      Prescription Details   Frequency (times per week) 3    Duration Progress to 30 minutes of continuous aerobic without signs/symptoms of physical distress      Intensity   THRR 40-80% of Max Heartrate 96-137    Ratings of Perceived Exertion 11-13    Perceived Dyspnea 0-4      Resistance Training   Training Prescription Yes    Weight 4    Reps 10-15          Perform Capillary Blood Glucose checks as needed.  Exercise Prescription Changes:   Exercise Prescription Changes     Row Name 06/29/24 0900             Response to Exercise   Blood Pressure (Admit) 130/66       Blood Pressure (Exercise) 140/60  Blood Pressure (Exit) 132/60       Heart Rate (Admit) 56 bpm       Heart Rate (Exercise) 76 bpm       Heart Rate (Exit) 62 bpm       Oxygen Saturation (Admit) 97 %       Oxygen Saturation (Exercise) 97 %       Oxygen Saturation (Exit) 97 %       Rating of Perceived Exertion (Exercise) 13       Perceived Dyspnea (Exercise) 1          Exercise Comments:   Exercise Goals and Review:   Exercise Goals     Row Name 06/29/24 0934             Exercise Goals   Increase Physical Activity Yes       Intervention Provide advice, education, support and counseling about physical activity/exercise needs.;Develop an individualized  exercise prescription for aerobic and resistive training based on initial evaluation findings, risk stratification, comorbidities and participant's personal goals.       Expected Outcomes Short Term: Attend rehab on a regular basis to increase amount of physical activity.;Long Term: Add in home exercise to make exercise part of routine and to increase amount of physical activity.;Long Term: Exercising regularly at least 3-5 days a week.       Increase Strength and Stamina Yes       Intervention Provide advice, education, support and counseling about physical activity/exercise needs.;Develop an individualized exercise prescription for aerobic and resistive training based on initial evaluation findings, risk stratification, comorbidities and participant's personal goals.       Expected Outcomes Short Term: Increase workloads from initial exercise prescription for resistance, speed, and METs.;Short Term: Perform resistance training exercises routinely during rehab and add in resistance training at home;Long Term: Improve cardiorespiratory fitness, muscular endurance and strength as measured by increased METs and functional capacity ( )       Able to understand and use rate of perceived exertion (RPE) scale Yes       Intervention Provide education and explanation on how to use RPE scale       Expected Outcomes Short Term: Able to use RPE daily in rehab to express subjective intensity level;Long Term:  Able to use RPE to guide intensity level when exercising independently       Able to understand and use Dyspnea scale Yes       Intervention Provide education and explanation on how to use Dyspnea scale       Expected Outcomes Short Term: Able to use Dyspnea scale daily in rehab to express subjective sense of shortness of breath during exertion;Long Term: Able to use Dyspnea scale to guide intensity level when exercising independently       Knowledge and understanding of Target Heart Rate Range (THRR) Yes        Intervention Provide education and explanation of THRR including how the numbers were predicted and where they are located for reference       Expected Outcomes Short Term: Able to state/look up THRR;Long Term: Able to use THRR to govern intensity when exercising independently;Short Term: Able to use daily as guideline for intensity in rehab       Able to check pulse independently Yes       Intervention Provide education and demonstration on how to check pulse in carotid and radial arteries.;Review the importance of being able to check your own pulse for safety during independent  exercise       Expected Outcomes Short Term: Able to explain why pulse checking is important during independent exercise;Long Term: Able to check pulse independently and accurately       Understanding of Exercise Prescription Yes       Intervention Provide education, explanation, and written materials on patient's individual exercise prescription       Expected Outcomes Short Term: Able to explain program exercise prescription;Long Term: Able to explain home exercise prescription to exercise independently          Exercise Goals Re-Evaluation :   Discharge Exercise Prescription (Final Exercise Prescription Changes):  Exercise Prescription Changes - 06/29/24 0900       Response to Exercise   Blood Pressure (Admit) 130/66    Blood Pressure (Exercise) 140/60    Blood Pressure (Exit) 132/60    Heart Rate (Admit) 56 bpm    Heart Rate (Exercise) 76 bpm    Heart Rate (Exit) 62 bpm    Oxygen Saturation (Admit) 97 %    Oxygen Saturation (Exercise) 97 %    Oxygen Saturation (Exit) 97 %    Rating of Perceived Exertion (Exercise) 13    Perceived Dyspnea (Exercise) 1          Nutrition:  Target Goals: Understanding of nutrition guidelines, daily intake of sodium 1500mg , cholesterol 200mg , calories 30% from fat and 7% or less from saturated fats, daily to have 5 or more servings of fruits and  vegetables.  Education: Nutrition 1 -Group instruction provided by verbal, written material, interactive activities, discussions, models, and posters to present general guidelines for heart healthy nutrition including macronutrients, label reading, and promoting whole foods over processed counterparts. Education serves as pensions consultant of discussion of heart healthy eating for all. Written material provided at class time. Flowsheet Row CARDIAC REHAB PHASE II ORIENTATION from 06/29/2024 in [facility]  Education need identified 06/29/24     Education: Nutrition 2 -Group instruction provided by verbal, written material, interactive activities, discussions, models, and posters to present general guidelines for heart healthy nutrition including sodium, cholesterol, and saturated fat. Providing guidance of habit forming to improve blood pressure, cholesterol, and body weight. Written material provided at class time. Flowsheet Row CARDIAC REHAB PHASE II ORIENTATION from 06/29/2024 in [facility]  Education need identified 06/29/24      Biometrics:  Pre Biometrics - 06/29/24 0935       Pre Biometrics   Height 5' 9 (1.753 m)    Weight 94.8 kg    Waist Circumference 46 inches    Hip Circumference 42 inches    Waist to Hip Ratio 1.1 %    BMI (Calculated) 30.85    Grip Strength 31.8 kg    Single Leg Stand 0 seconds           Nutrition Therapy Plan and Nutrition Goals:   Nutrition Assessments:  MEDIFICTS Score Key: >=70 Need to make dietary changes  40-70 Heart Healthy Diet <= 40 Therapeutic Level Cholesterol Diet  Flowsheet Row CARDIAC REHAB PHASE II ORIENTATION from 06/29/2024 in [facility]  Picture Your Plate Total Score on Admission 46   Picture Your Plate Scores: <59 Unhealthy dietary pattern with much room for improvement. 41-50 Dietary pattern unlikely to meet recommendations for good health and room for  improvement. 51-60 More healthful dietary pattern, with some room for improvement.  >60 Healthy dietary pattern, although there may be some specific behaviors that could be improved.  Nutrition Goals Re-Evaluation:   Nutrition Goals Discharge (Final Nutrition Goals Re-Evaluation):   Psychosocial: Target Goals: Acknowledge presence or absence of significant depression and/or stress, maximize coping skills, provide positive support system. Participant is able to verbalize types and ability to use techniques and skills needed for reducing stress and depression.   Education: Stress, Anxiety, and Depression - Group verbal and visual presentation to define topics covered.  Reviews how body is impacted by stress, anxiety, and depression.  Also discusses healthy ways to reduce stress and to treat/manage anxiety and depression. Written material provided at class time. Flowsheet Row CARDIAC REHAB PHASE II ORIENTATION from 06/29/2024 in [facility]  Education need identified 06/29/24    Education: Sleep Hygiene -Provides group verbal and written instruction about how sleep can affect your health.  Define sleep hygiene, discuss sleep cycles and impact of sleep habits. Review good sleep hygiene tips.   Initial Review & Psychosocial Screening:  Initial Psych Review & Screening - 06/29/24 0836       Initial Review   Current issues with None Identified      Family Dynamics   Good Support System? Yes      Barriers   Psychosocial barriers to participate in program The patient should benefit from training in stress management and relaxation.;There are no identifiable barriers or psychosocial needs.      Screening Interventions   Interventions Program counselor consult;To provide support and resources with identified psychosocial needs;Provide feedback about the scores to participant    Expected Outcomes Short Term goal: Utilizing psychosocial counselor, staff and physician  to assist with identification of specific Stressors or current issues interfering with healing process. Setting desired goal for each stressor or current issue identified.;Long Term Goal: Stressors or current issues are controlled or eliminated.;Short Term goal: Identification and review with participant of any Quality of Life or Depression concerns found by scoring the questionnaire.;Long Term goal: The participant improves quality of Life and PHQ9 Scores as seen by post scores and/or verbalization of changes          Quality of Life Scores:   Quality of Life - 06/29/24 0937       Quality of Life   Select Quality of Life      Quality of Life Scores   Health/Function Pre 26.8 %    Socioeconomic Pre 30 %    Psych/Spiritual Pre 30 %    Family Pre 28.8 %    GLOBAL Pre 28.36 %         Scores of 19 and below usually indicate a poorer quality of life in these areas.  A difference of  2-3 points is a clinically meaningful difference.  A difference of 2-3 points in the total score of the Quality of Life Index has been associated with significant improvement in overall quality of life, self-image, physical symptoms, and general health in studies assessing change in quality of life.  PHQ-9: Review Flowsheet  More data exists      06/29/2024 05/28/2024 03/09/2024 01/07/2024 08/30/2023  Depression screen PHQ 2/9  Decreased Interest 0 0 0 0 0  Down, Depressed, Hopeless 0 0 0 0 0  PHQ - 2 Score 0 0 0 0 0  Altered sleeping 1 - 1 0 0  Tired, decreased energy 1 - 2 0 0  Change in appetite 0 - 0 0 0  Feeling bad or failure about yourself  0 - 0 0 0  Trouble concentrating 1 - 1 0 0  Moving slowly or fidgety/restless 0 - 1 0 0  Suicidal thoughts 0 - 0 0 0  PHQ-9 Score 3 - 5  0  0   Difficult doing work/chores Not difficult at all - Not difficult at all Not difficult at all -    Details       Data saved with a previous flowsheet row definition        Interpretation of Total Score  Total  Score Depression Severity:  1-4 = Minimal depression, 5-9 = Mild depression, 10-14 = Moderate depression, 15-19 = Moderately severe depression, 20-27 = Severe depression   Psychosocial Evaluation and Intervention:  Psychosocial Evaluation - 06/29/24 0838       Psychosocial Evaluation & Interventions   Interventions Stress management education;Relaxation education;Encouraged to exercise with the program and follow exercise prescription    Comments Patient was referred to cardiac rehab with CABGx1. He completed the program in 2017 with a stent. He denies any depression, anxiety or stressors. He reports sleeping okay. He usually sleeps in a recliner due to his chronic bilateral hip and back pain. He lives with is wife who is his main support along with his 2 adult sons. He has an adopted son age 28. 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 He is very active in his church and does volunteer work in a advertising copywriter. His goals for the program to make sure his heart is doing okay, his heart rate is good and he wants to be as active as he can.  His chronic pain could be a barrier for him but he hopes to be able to exercise without increasing his pain.    Expected Outcomes Short Term: Patient will start the program and attend consistently. Long Term: Patient will complete the program meeting personal goals.    Continue Psychosocial Services  Follow up required by staff          Psychosocial Re-Evaluation:   Psychosocial Discharge (Final Psychosocial Re-Evaluation):   Vocational Rehabilitation: Provide vocational rehab assistance to qualifying candidates.   Vocational Rehab Evaluation & Intervention:  Vocational Rehab - 06/29/24 [phone]       Initial Vocational Rehab Evaluation & Intervention   Assessment shows need for Vocational Rehabilitation No      Vocational Rehab Re-Evaulation   Comments Patient is retired.          Education: Education Goals: Education classes will be  provided on a variety of topics geared toward better understanding of heart health and risk factor modification. Participant will state understanding/return demonstration of topics presented as noted by education test scores.  Learning Barriers/Preferences:  Learning Barriers/Preferences - 06/29/24 0834       Learning Barriers/Preferences   Learning Barriers None    Learning Preferences Skilled Demonstration          General Cardiac Education Topics:  AED/CPR: - Group verbal and written instruction with the use of models to demonstrate the basic use of the AED with the basic ABC's of resuscitation.   Test and Procedures: - Group verbal and visual presentation and models provide information about basic cardiac anatomy and function. Reviews the testing methods done to diagnose heart disease and the outcomes of the test results. Describes the treatment choices: Medical Management, Angioplasty, or Coronary Bypass Surgery for treating various heart conditions including Myocardial Infarction, Angina, Valve Disease, and Cardiac Arrhythmias. Written material provided at class time.   Medication Safety: - Group verbal and visual instruction to review commonly prescribed  medications for heart and lung disease. Reviews the medication, class of the drug, and side effects. Includes the steps to properly store meds and maintain the prescription regimen. Written material provided at class time.   Intimacy: - Group verbal instruction through game format to discuss how heart and lung disease can affect sexual intimacy. Written material provided at class time.   Know Your Numbers and Heart Failure: - Group verbal and visual instruction to discuss disease risk factors for cardiac and pulmonary disease and treatment options.  Reviews associated critical values for Overweight/Obesity, Hypertension, Cholesterol, and Diabetes.  Discusses basics of heart failure: signs/symptoms and treatments.  Introduces  Heart Failure Zone chart for action plan for heart failure. Written material provided at class time.   Infection Prevention: - Provides verbal and written material to individual with discussion of infection control including proper hand washing and proper equipment cleaning during exercise session.   Falls Prevention: - Provides verbal and written material to individual with discussion of falls prevention and safety.   Other: -Provides group and verbal instruction on various topics (see comments)   Knowledge Questionnaire Score:  Knowledge Questionnaire Score - 06/29/24 0911       Knowledge Questionnaire Score   Pre Score 18/26          Core Components/Risk Factors/Patient Goals at Admission:  Personal Goals and Risk Factors at Admission - 06/29/24 0835       Core Components/Risk Factors/Patient Goals on Admission    Weight Management Weight Maintenance    Improve shortness of breath with ADL's Yes    Intervention Provide education, individualized exercise plan and daily activity instruction to help decrease symptoms of SOB with activities of daily living.    Expected Outcomes Short Term: Improve cardiorespiratory fitness to achieve a reduction of symptoms when performing ADLs;Long Term: Be able to perform more ADLs without symptoms or delay the onset of symptoms    Hypertension Yes    Intervention Provide education on lifestyle modifcations including regular physical activity/exercise, weight management, moderate sodium restriction and increased consumption of fresh fruit, vegetables, and low fat dairy, alcohol moderation, and smoking cessation.;Monitor prescription use compliance.    Expected Outcomes Short Term: Continued assessment and intervention until BP is < 140/15mm HG in hypertensive participants. < 130/26mm HG in hypertensive participants with diabetes, heart failure or chronic kidney disease.;Long Term: Maintenance of blood pressure at goal levels.    Lipids Yes     Intervention Provide education and support for participant on nutrition & aerobic/resistive exercise along with prescribed medications to achieve LDL 70mg , HDL >40mg .    Expected Outcomes Short Term: Participant states understanding of desired cholesterol values and is compliant with medications prescribed. Participant is following exercise prescription and nutrition guidelines.;Long Term: Cholesterol controlled with medications as prescribed, with individualized exercise RX and with personalized nutrition plan. Value goals: LDL < 70mg , HDL > 40 mg.          Education:Diabetes - Individual verbal and written instruction to review signs/symptoms of diabetes, desired ranges of glucose level fasting, after meals and with exercise. Acknowledge that pre and post exercise glucose checks will be done for 3 sessions at entry of program.   Core Components/Risk Factors/Patient Goals Review:    Core Components/Risk Factors/Patient Goals at Discharge (Final Review):    ITP Comments:   Comments: Patient arrived for 1st visit/orientation/education at 0800. Patient was referred to CR by Dr. James Hochrien/[name] attending MD due to Status Post CABGx1. During orientation advised patient on arrival  and appointment times what to wear, what to do before, during and after exercise. Reviewed attendance and class policy.  Pt is scheduled to return Cardiac Rehab on 07/01/24 at 915. Pt was advised to come to class 15 minutes before class starts.  Discussed RPE/Dpysnea scales. Patient participated in warm up stretches. Patient was able to complete 6 minute walk test.  Telemetry:NSR. Patient was measured for the equipment. Discussed equipment safety with patient. Took patient pre-anthropometric measurements. Patient finished visit at 930.

## 2024-07-01 ENCOUNTER — Encounter (HOSPITAL_COMMUNITY)
Admission: RE | Admit: 2024-07-01 | Discharge: 2024-07-01 | Disposition: A | Discharge status: Home / Self Care | Source: Ambulatory Visit | Attending: Cardiology | Admitting: Cardiology

## 2024-07-01 DIAGNOSIS — Z951 Presence of aortocoronary bypass graft: Secondary | ICD-10-CM

## 2024-07-01 NOTE — Progress Notes (Signed)
 Cardiology Office Note   Date:  07/09/2024  ID:  Jeffrey Hooper, DOB July 04, 1961, MRN [id number] PCP: [name], DO  [facility] Providers Cardiologist:  Evangaline Jou MD Click to update primary MD,subspecialty MD or APP then REFRESH:1}    History of Present Illness Jeffrey Hooper is a 63 y.o. male is seen for follow up CAD. He has history of prior stent in the LAD in 2017. More recently had abnormal myoview  study showing inferior wall ischemia. This led to cardiac cath showing occlusion of the mid RCA with collaterals. LAD stent was patent. Due to persistent symptoms he had attempted CTO PCI of the RCA on 03/19/24. This was unsuccessful due to inability to cross the lesion with a wire. The lesion was heavily calcified. Ranexa  was added to his medical therapy.   He reports he has had anginal symptoms for the past year. Was admitted in May with severe bradycardia. Toprol  discontinued. Had Myoview  showing inferior wall ischemia which led to cath.  On 04/24/2024 patient presented ED for further evaluation of chest pain shortness of breath.  EKG showed normal sinus rhythm with no acute ischemic changes.  Patient reported he been having almost constant chest pain that have been worsening since last seen and on maximal medical therapy.  Patient was referred to CT surgery for consideration of single-vessel CABG.   On 05/21/2024 patient underwent CABG x 1 with left radial artery to PDA with Dr. Shyrl.  Patient did develop postoperative atrial fibrillation, managed with amiodarone  protocol and converted to sinus rhythm.  Patient was discharged on 05/26/2024 in stable condition.  When seen initially was started back on olmesartan  20 mg daily and metoprolol  was discontinued due to bradycardia. Now doing Cardiac Rehab. Home BP has been persistently elevated 140-150 range. Notes some palpitations several times a day only lasting a few seconds. No bleeding. Has some chest pain left  parasternal but not like he had prior to surgery.  ROS: as noted in HPI otherwise negative  Studies Reviewed      Echo 12/24/23: IMPRESSIONS     1. Left ventricular ejection fraction, by estimation, is 60 to 65%. The  left ventricle has normal function. The left ventricle has no regional  wall motion abnormalities. There is mild concentric left ventricular  hypertrophy. Left ventricular diastolic  parameters were normal.   2. Right ventricular systolic function is normal. The right ventricular  size is mildly enlarged. Tricuspid regurgitation signal is inadequate for  assessing PA pressure.   3. Left atrial size was mildly dilated.   4. The mitral valve is normal in structure. Trivial mitral valve  regurgitation.   5. The aortic valve is tricuspid. Aortic valve regurgitation is not  visualized.   6. There is borderline dilatation of the ascending aorta, measuring 39  mm.   7. The inferior vena cava is normal in size with greater than 50%  respiratory variability, suggesting right atrial pressure of 3 mmHg.   Comparison(s): Prior images unable to be directly viewed, comparison made  by report only.   Myoview  02/17/24:  Narrative & Impression      Inferior defect difficult to interpret in setting of adjacent gut radiotracer uptake. Findings suggest probable inferior infarct with mild to moderate peri-infarct ischemia. Overall low to intermediate risk findings.   No ST deviation was noted.   LV perfusion is abnormal. There is a moderate size moderate intensity inferior defect with mild to moderate reversibility. Interpretation is affected by significant adjacent gut  radiotracer uptake.   Left ventricular function is normal. End diastolic cavity size is normal.   Cardiac cath 02/26/24:  LEFT HEART CATH AND CORONARY ANGIOGRAPHY   Conclusion      Ost Cx to Prox Cx lesion is 50% stenosed.   Mid LAD lesion is 30% stenosed.   Prox RCA to Mid RCA lesion is 100% stenosed.   The left  ventricular systolic function is normal.   LV end diastolic pressure is normal.   The left ventricular ejection fraction is 55-65% by visual estimate.   Single vessel occlusive CAD. Continued patency of stent in the LAD. The RCA is occluded in the mid vessel with excellent left to right collaterals from the second diagonal Normal LV function Normal LVEDP     Risk Assessment/Calculations  HYPERTENSION CONTROL Vitals:   07/09/24 0810 07/09/24 0848  BP: (!) 153/76 (!) 148/78    The patient's blood pressure is elevated above target today.  In order to address the patient's elevated BP: A current anti-hypertensive medication was adjusted today.          Physical Exam VS:  BP (!) 148/78   Pulse (!) 59   Ht 5' 9.5 (1.765 m)   Wt 211 lb 12.8 oz (96.1 kg)   SpO2 98%   BMI 30.83 kg/m        Wt Readings from Last 3 Encounters:  07/09/24 211 lb 12.8 oz (96.1 kg)  07/07/24 209 lb (94.8 kg)  06/29/24 208 lb 15.9 oz (94.8 kg)    GEN: Well nourished, well developed in no acute distress NECK: No JVD; No carotid bruits CARDIAC: RRR, no murmurs, rubs, gallops RESPIRATORY:  Clear to auscultation without rales, wheezing or rhonchi  ABDOMEN: Soft, non-tender, non-distended. Groin areas without hematoma EXTREMITIES:  No edema; No deformity   ASSESSMENT AND PLAN CAD with prior stent of LAD in 2017. Anginal symptoms for the past year with abnormal Myoview  showing inferior perfusion abnormality. Has CTO of the mid RCA. Patent LAD stent. 50% LCx. Unsuccessful attempt at PCI of RCA. Refractory angina despite optimal medical therapy. Now s/p CABG x 1. Clinically doing well. Suspect pain now still musculoskeletal from surgery.  Post op Afib- on amiodarone . Still having some palpitations. Will arrange for 2 week event monitor. If he does have some Afib will need to consider anticoagulation. If no Afib would like to ultimately get off amiodarone . If continues on amiodarone  will need to check LFTs and  thyroid  in 3 months. HLD. Goal LDL < 55. On lipitor  80 mg and Zetia . Excellent results with LDL 50 HTN elevated. Will increase olmesartan  to 40 mg daily. Avoid beta blocker due to brady. If need be could increase amlodipine         Dispo: follow up  in 3 months.  Signed, Tyjon Bowen, MD

## 2024-07-01 NOTE — Progress Notes (Signed)
 Daily Session Note  Patient Details  Name: Jeffrey Hooper MRN: [id number] Date of Birth: 11-16-1960 Referring Provider:   Flowsheet Row CARDIAC REHAB PHASE II ORIENTATION from 06/29/2024 in [facility] CARDIAC REHABILITATION  Referring Provider Jordan, Peter MD    Encounter Date: 07/01/2024  Check In:  Session Check In - 07/01/24 0907       Check-In   Supervising physician immediately available to respond to emergencies See telemetry face sheet for immediately available MD    Location [facility]    Staff Present Laymon Rattler, BSN, RN, WTA-C;Heather Con HECKLE, Exercise Physiologist;Hillary Troutman BSN, RN    Virtual Visit No    Medication changes reported     No    Fall or balance concerns reported    No    Tobacco Cessation No Change    Warm-up and Cool-down Performed on first and last piece of equipment    Resistance Training Performed Yes    VAD Patient? No    PAD/SET Patient? No      Pain Assessment   Currently in Pain? No/denies          Capillary Blood Glucose: No results found for this or any previous visit (from the past 24 hours).    Social History   Tobacco Use  Smoking Status Former   Current packs/day: 0.00   Average packs/day: 1 pack/day for 20.0 years (20.0 ttl pk-yrs)   Types: Cigarettes   Start date: 11/28/1986   Quit date: 11/28/2006   Years since quitting: 17.6  Smokeless Tobacco Never    Goals Met:  Independence with exercise equipment Exercise tolerated well No report of concerns or symptoms today Strength training completed today  Goals Unmet:  Not Applicable  Comments: First full day of exercise!  Patient was oriented to gym and equipment including functions, settings, policies, and procedures.  Patient's individual exercise prescription and treatment plan were reviewed.  All starting workloads were established based on the results of the 6 minute walk test done at initial orientation visit.  The plan for  exercise progression was also introduced and progression will be customized based on patient's performance and goals.

## 2024-07-06 ENCOUNTER — Encounter (HOSPITAL_COMMUNITY)
Admission: RE | Admit: 2024-07-06 | Discharge: 2024-07-06 | Disposition: A | Source: Ambulatory Visit | Attending: Cardiology

## 2024-07-06 DIAGNOSIS — Z951 Presence of aortocoronary bypass graft: Secondary | ICD-10-CM | POA: Insufficient documentation

## 2024-07-06 NOTE — Progress Notes (Signed)
 Daily Session Note  Patient Details  Name: Jeffrey Hooper MRN: [id number] Date of Birth: Jul 12, 1961 Referring Provider:   Flowsheet Row CARDIAC REHAB PHASE II ORIENTATION from 06/29/2024 in [facility] CARDIAC REHABILITATION  Referring Provider Jordan, Peter MD    Encounter Date: 07/06/2024  Check In:  Session Check In - 07/06/24 0915       Check-In   Supervising physician immediately available to respond to emergencies See telemetry face sheet for immediately available MD    Location [facility]    Staff Present Richerd Buddle, RN;Shuree Brossart Vicci, RN, BSN;Heather Con, BS, Exercise Physiologist    Virtual Visit No    Medication changes reported     No    Fall or balance concerns reported    No    Warm-up and Cool-down Performed on first and last piece of equipment    Resistance Training Performed Yes    VAD Patient? No    PAD/SET Patient? No      Pain Assessment   Currently in Pain? No/denies    Multiple Pain Sites No          Capillary Blood Glucose: No results found for this or any previous visit (from the past 24 hours).    Social History   Tobacco Use  Smoking Status Former   Current packs/day: 0.00   Average packs/day: 1 pack/day for 20.0 years (20.0 ttl pk-yrs)   Types: Cigarettes   Start date: 11/28/1986   Quit date: 11/28/2006   Years since quitting: 17.6  Smokeless Tobacco Never    Goals Met:  Independence with exercise equipment Exercise tolerated well No report of concerns or symptoms today Strength training completed today  Goals Unmet:  Not Applicable  Comments: Pt able to follow exercise prescription today without complaint.  Will continue to monitor for progression.

## 2024-07-07 ENCOUNTER — Ambulatory Visit: Admitting: Internal Medicine

## 2024-07-07 ENCOUNTER — Encounter: Payer: Self-pay | Admitting: Internal Medicine

## 2024-07-07 ENCOUNTER — Encounter (HOSPITAL_COMMUNITY): Payer: Self-pay | Admitting: *Deleted

## 2024-07-07 VITALS — BP 120/70 | HR 50 | Ht 70.0 in | Wt 209.0 lb

## 2024-07-07 DIAGNOSIS — G8929 Other chronic pain: Secondary | ICD-10-CM

## 2024-07-07 DIAGNOSIS — K648 Other hemorrhoids: Secondary | ICD-10-CM | POA: Diagnosis not present

## 2024-07-07 DIAGNOSIS — R1013 Epigastric pain: Secondary | ICD-10-CM

## 2024-07-07 DIAGNOSIS — L290 Pruritus ani: Secondary | ICD-10-CM

## 2024-07-07 DIAGNOSIS — R6881 Early satiety: Secondary | ICD-10-CM

## 2024-07-07 DIAGNOSIS — Z951 Presence of aortocoronary bypass graft: Secondary | ICD-10-CM

## 2024-07-07 MED ORDER — BUSPIRONE HCL 10 MG PO TABS: 10.0000 mg | ORAL_TABLET | Freq: Two times a day (BID) | ORAL | 5 refills | Status: DC

## 2024-07-07 MED ORDER — HYDROCORTISONE (PERIANAL) 2.5 % EX CREA: TOPICAL_CREAM | Freq: Two times a day (BID) | CUTANEOUS | 0 refills | Status: AC | PRN

## 2024-07-07 NOTE — Patient Instructions (Addendum)
 VISIT SUMMARY: During your visit, we discussed your persistent epigastric pressure and gastrointestinal symptoms, hemorrhoidal symptoms, and sleep disturbances. We reviewed your recent upper GI test and made adjustments to your treatment plan to help manage your symptoms.  YOUR PLAN: EPIGASTRIC PAIN AND EARLY SATIETY: You have been experiencing chronic pressure in your upper stomach area and feeling full quickly when eating. This could be due to gastritis or functional dyspepsia. -We have prescribed buspirone to help relax your stomach muscles. Please monitor for any side effects such as sleepiness or bad dreams. -Continue taking your current acid-blocking medication.  HEMORRHOIDS WITH PRURITUS ANI: You have chronic hemorrhoids with occasional itching and minor bleeding. -We have prescribed hydrocortisone to help with itching and inflammation. -Continue using your current hemorrhoid treatment regimen. - Read the information about pruritus ani also (anal itchig)   Pruritis Ani Instructions  What is pruritus ani?  Pruritus ani is severe itching around the anal area. It can be intermittent or constant and may significantly affect your quality of life. In most cases, an underlying cause can be identified and treated.[1]  Hygiene measures  Proper hygiene is the foundation of treatment:[2][3]  - Keep the area clean and dry. After bowel movements, gently clean the area with plain water  or unscented, alcohol-free wipes. Pat dry with soft toilet paper or a clean towel.  - Avoid moisture. Excess moisture can worsen itching. Consider placing a small cotton ball or gauze pad against the anal opening to absorb moisture throughout the day.[3]  - Avoid harsh soaps. Do not use soaps, bubble baths, or scented products in the anal area.[3]  - Take short showers. Limit showers to less than 3 minutes in lukewarm water .[4]  - Stop scratching. Scratching damages the skin and makes itching worse. Try to  resist the urge, especially at night.  Dietary changes  Certain foods and beverages can trigger or worsen anal itching:[3][5]  - Avoid common irritants such as coffee, tea, cola, chocolate, citrus fruits, tomatoes, and spicy foods.  - Increase fiber intake. Eating more fiber helps form solid stools and reduces irritation. Aim for 25-30 grams of fiber daily through fruits, vegetables, and whole grains, or use a fiber supplement.[2][3]  - Stay hydrated. Drink plenty of water  to help maintain regular, soft bowel movements.  Over-the-counter treatments  If hygiene and dietary changes do not provide relief:[2][6][5]  - Barrier creams. Apply plain petroleum jelly or zinc oxide ointment to protect the skin.[2][5]  - Soothing agents. Products containing menthol  (1-5%), camphor, or pramoxine (1-2%) can provide relief and are available without a prescription.[6][5]  - Hydrocortisone cream (1%). If you have not previously used steroid creams in this area, you may try 1% hydrocortisone ointment twice daily for a short period. Do not use for more than 2 weeks without consulting your doctor.[5]  When to see your doctor  Contact your healthcare provider if:  - Symptoms do not improve after 2-4 weeks of self-care measures  - You notice bleeding, pain, or discharge  - You develop new skin changes or lumps  - You experience weight loss or changes in bowel habits  - You are over 69 years old and have not had a recent colonoscopy[7]  Prescription treatments  If over-the-counter measures fail, your doctor may prescribe:[2][3][5]  - Stronger topical steroids for short-term use  - Tacrolimus ointment (0.1%), though it may cause burning initially[2][3]  - Capsaicin cream (0.006%) for persistent cases[2][3]  - Other specialized treatments for difficult cases  Important reminders  -  Be patient. It may take several weeks to see improvement.  - Avoid all potential irritants, including baby  wipes, scented toilet paper, and tight-fitting underwear.  - Wear loose, breathable cotton underwear.  - Keep a journal of your symptoms, foods eaten, and products used to help identify triggers.[8]  Treatment is most successful when you identify and avoid your specific triggers while maintaining good hygiene practices.   I appreciate the opportunity to care for you. Lupita Commander, MD, [facility]

## 2024-07-07 NOTE — Progress Notes (Signed)
 Cardiac Individual Treatment Plan  Patient Details  Name: Jeffrey Hooper MRN: [id number] Date of Birth: Mar 13, 1961 Referring Provider:   Flowsheet Row CARDIAC REHAB PHASE II ORIENTATION from 06/29/2024 in [facility]  Referring Provider Jordan, Peter MD    Initial Encounter Date:  Flowsheet Row CARDIAC REHAB PHASE II ORIENTATION from 06/29/2024 in [place] [facility]  Date 06/29/24    Visit Diagnosis: S/P CABG x 1  Patient's Home Medications on Admission:  Current Outpatient Medications:    acetaminophen  (TYLENOL ) 500 MG tablet, Take 1,000 mg by mouth every 8 (eight) hours as needed for moderate pain (pain score 4-6)., Disp: , Rfl:    albuterol  (VENTOLIN  HFA) 108 (90 Base) MCG/ACT inhaler, TAKE 2 PUFFS BY MOUTH EVERY 6 HOURS AS NEEDED FOR WHEEZE OR SHORTNESS OF BREATH, Disp: 18 each, Rfl: 1   amiodarone  (PACERONE ) 200 MG tablet, Take 1 tablet (200 mg total) by mouth 2 (two) times daily. (Patient taking differently: Take 200 mg by mouth daily.), Disp: 60 tablet, Rfl: 1   amLODipine  (NORVASC ) 2.5 MG tablet, Take 1 tablet (2.5 mg total) by mouth daily., Disp: 30 tablet, Rfl: 1   aspirin  EC 325 MG tablet, Take 1 tablet (325 mg total) by mouth daily., Disp: , Rfl:    atorvastatin  (LIPITOR ) 80 MG tablet, TAKE 1 TABLET BY MOUTH EVERYDAY AT BEDTIME, Disp: 90 tablet, Rfl: 3   cetirizine  (ZYRTEC ) 10 MG tablet, Take 10 mg by mouth daily., Disp: , Rfl:    EPINEPHrine  0.3 mg/0.3 mL IJ SOAJ injection, Inject 0.3 mg into the muscle as needed for anaphylaxis., Disp: 2 each, Rfl: 0   ezetimibe  (ZETIA ) 10 MG tablet, Take 1 tablet (10 mg total) by mouth daily., Disp: 90 tablet, Rfl: 3   fluticasone  (FLONASE ) 50 MCG/ACT nasal spray, Place 2 sprays into both nostrils daily as needed for allergies or rhinitis., Disp: , Rfl:    levothyroxine  (SYNTHROID ) 100 MCG tablet, Take 1 tablet (100 mcg total) by mouth daily., Disp: 90 tablet, Rfl: 3   MAGNESIUM  GLYCINATE PO, Take 2  tablets by mouth at bedtime., Disp: , Rfl:    olmesartan  (BENICAR ) 20 MG tablet, Take 1 tablet (20 mg total) by mouth daily., Disp: 90 tablet, Rfl: 3   ondansetron  (ZOFRAN -ODT) 4 MG disintegrating tablet, TAKE 1 TABLET BY MOUTH EVERY 8 HOURS AS NEEDED FOR NAUSEA AND VOMITING, Disp: 20 tablet, Rfl: 0   pantoprazole  (PROTONIX ) 40 MG tablet, Take 40 mg by mouth 2 (two) times daily., Disp: , Rfl:    PARoxetine  (PAXIL ) 20 MG tablet, Take 1 tablet (20 mg total) by mouth daily., Disp: 90 tablet, Rfl: 3  Current Facility-Administered Medications:    cyanocobalamin  (VITAMIN B12) injection 1,000 mcg, 1,000 mcg, Intramuscular, Q30 days, Gottschalk, Ashly M, DO, 1,000 mcg at 06/10/24 1143  Past Medical History: Past Medical History:  Diagnosis Date   Abnormal nuclear stress test 12/02/2015   Allergy     Allergy  to alpha-gal    Arthritis    CAD (coronary artery disease), native coronary artery 12/03/2015   Coronary artery disease    Difficult intubation    anterior larynx and limited oral opening, glidescope used electively   Dysrhythmia    PACs and SVT   GERD (gastroesophageal reflux disease)    Head injury with loss of consciousness (HCC)    back in the 1980's   History of hiatal hernia    History of kidney stones    Hyperlipidemia    Hypertension    Hypothyroid  Multiple benign nevi    Neuromuscular disorder (HCC) 2011   LEFT RADIAL NERVE SURGERY R/T TRAUMA   Pneumonia 2014, 2017   Prostatism    Pulmonary nodule    Spinal cord stimulator status    told to bring remote to surgery    Tobacco Use: Social History   Tobacco Use  Smoking Status Former   Current packs/day: 0.00   Average packs/day: 1 pack/day for 20.0 years (20.0 ttl pk-yrs)   Types: Cigarettes   Start date: 11/28/1986   Quit date: 11/28/2006   Years since quitting: 17.6  Smokeless Tobacco Never    Labs: Review Flowsheet  More data exists      Latest Ref Rng & Units 08/30/2023 12/23/2023 05/19/2024 05/21/2024  06/10/2024  Labs for ITP Cardiac and Pulmonary Rehab  Cholestrol 100 - 199 mg/dL 854  857  - - 880   LDL (calc) 0 - 99 mg/dL 73  69  - - 50   HDL-C >39 mg/dL 45  46  - - 52   Trlycerides 0 - 149 mg/dL 843  866  - - 89   Hemoglobin A1c 4.8 - 5.6 % 5.7  - 5.8  - -  PH, Arterial 7.35 - 7.45 - - - 7.352  7.367  7.460  7.439  7.418  7.355  -  PCO2 arterial 32 - 48 mmHg - - - 37.8  36.4  32.0  32.1  33.4  41.9  -  Bicarbonate 20.0 - 28.0 mmol/L - - - 21.0  21.0  23.4  21.7  21.5  23.4  -  TCO2 22 - 32 mmol/L - - - 22  22  25  23  22  23  22  25  25   -  Acid-base deficit 0.0 - 2.0 mmol/L - - - 4.0  4.0  1.0  2.0  2.0  2.0  -  O2 Saturation % - - - 97  98  97  100  100  100  -    Details       Multiple values from one day are sorted in reverse-chronological order         Capillary Blood Glucose: Lab Results  Component Value Date   GLUCAP 93 05/26/2024   GLUCAP 130 (H) 05/25/2024   GLUCAP 98 05/25/2024   GLUCAP 142 (H) 05/25/2024   GLUCAP 99 05/25/2024     Exercise Target Goals: Exercise Program Goal: Individual exercise prescription set using results from initial 6 min walk test and THRR while considering  patient's activity barriers and safety.   Exercise Prescription Goal: Starting with aerobic activity 30 plus minutes a day, 3 days per week for initial exercise prescription. Provide home exercise prescription and guidelines that participant acknowledges understanding prior to discharge.  Activity Barriers & Risk Stratification:  Activity Barriers & Cardiac Risk Stratification - 06/29/24 0758       Activity Barriers & Cardiac Risk Stratification   Activity Barriers Neck/Spine Problems;Back Problems;Assistive Device;Shortness of Breath;Arthritis   Patient reports several fused disc in cervical and lumbar areas with chronic pain. He has pins in hips due to degenerative OA. He uses a straight cane for ambulation.   Cardiac Risk Stratification High          6 Minute Walk:   6 Minute Walk     Row Name 06/29/24 0932         6 Minute Walk   Phase Initial     Distance 1000 feet  Walk Time 6 minutes     # of Rest Breaks 0     MPH 1.9     METS 2.4     RPE 13     Perceived Dyspnea  1     VO2 Peak 8.4     Symptoms No     Resting HR 56 bpm     Resting BP 130/66     Resting Oxygen Saturation  97 %     Exercise Oxygen Saturation  during 6 min walk 97 %     Max Ex. HR 76 bpm     Max Ex. BP 140/60     2 Minute Post BP 132/60        Oxygen Initial Assessment:   Oxygen Re-Evaluation:   Oxygen Discharge (Final Oxygen Re-Evaluation):   Initial Exercise Prescription:  Initial Exercise Prescription - 06/29/24 0900       Date of Initial Exercise RX and Referring Provider   Date 06/29/24    Referring Provider Jordan, Peter MD      NuStep   Level 2    SPM 50    Minutes 15    METs 1.9      Arm Ergometer   Level 1    RPM 50    Minutes 15    METs 1.9      Prescription Details   Frequency (times per week) 3    Duration Progress to 30 minutes of continuous aerobic without signs/symptoms of physical distress      Intensity   THRR 40-80% of Max Heartrate 96-137    Ratings of Perceived Exertion 11-13    Perceived Dyspnea 0-4      Resistance Training   Training Prescription Yes    Weight 4    Reps 10-15          Perform Capillary Blood Glucose checks as needed.  Exercise Prescription Changes:   Exercise Prescription Changes     Row Name 06/29/24 0900             Response to Exercise   Blood Pressure (Admit) 130/66       Blood Pressure (Exercise) 140/60       Blood Pressure (Exit) 132/60       Heart Rate (Admit) 56 bpm       Heart Rate (Exercise) 76 bpm       Heart Rate (Exit) 62 bpm       Oxygen Saturation (Admit) 97 %       Oxygen Saturation (Exercise) 97 %       Oxygen Saturation (Exit) 97 %       Rating of Perceived Exertion (Exercise) 13       Perceived Dyspnea (Exercise) 1          Exercise Comments:    Exercise Comments     Row Name 07/01/24 0909           Exercise Comments First full day of exercise!  Patient was oriented to gym and equipment including functions, settings, policies, and procedures.  Patient's individual exercise prescription and treatment plan were reviewed.  All starting workloads were established based on the results of the 6 minute walk test done at initial orientation visit.  The plan for exercise progression was also introduced and progression will be customized based on patient's performance and goals.          Exercise Goals and Review:   Exercise Goals     Row Name 06/29/24 [phone]  Exercise Goals   Increase Physical Activity Yes       Intervention Provide advice, education, support and counseling about physical activity/exercise needs.;Develop an individualized exercise prescription for aerobic and resistive training based on initial evaluation findings, risk stratification, comorbidities and participant's personal goals.       Expected Outcomes Short Term: Attend rehab on a regular basis to increase amount of physical activity.;Long Term: Add in home exercise to make exercise part of routine and to increase amount of physical activity.;Long Term: Exercising regularly at least 3-5 days a week.       Increase Strength and Stamina Yes       Intervention Provide advice, education, support and counseling about physical activity/exercise needs.;Develop an individualized exercise prescription for aerobic and resistive training based on initial evaluation findings, risk stratification, comorbidities and participant's personal goals.       Expected Outcomes Short Term: Increase workloads from initial exercise prescription for resistance, speed, and METs.;Short Term: Perform resistance training exercises routinely during rehab and add in resistance training at home;Long Term: Improve cardiorespiratory fitness, muscular endurance and strength as measured by  increased METs and functional capacity ( )       Able to understand and use rate of perceived exertion (RPE) scale Yes       Intervention Provide education and explanation on how to use RPE scale       Expected Outcomes Short Term: Able to use RPE daily in rehab to express subjective intensity level;Long Term:  Able to use RPE to guide intensity level when exercising independently       Able to understand and use Dyspnea scale Yes       Intervention Provide education and explanation on how to use Dyspnea scale       Expected Outcomes Short Term: Able to use Dyspnea scale daily in rehab to express subjective sense of shortness of breath during exertion;Long Term: Able to use Dyspnea scale to guide intensity level when exercising independently       Knowledge and understanding of Target Heart Rate Range (THRR) Yes       Intervention Provide education and explanation of THRR including how the numbers were predicted and where they are located for reference       Expected Outcomes Short Term: Able to state/look up THRR;Long Term: Able to use THRR to govern intensity when exercising independently;Short Term: Able to use daily as guideline for intensity in rehab       Able to check pulse independently Yes       Intervention Provide education and demonstration on how to check pulse in carotid and radial arteries.;Review the importance of being able to check your own pulse for safety during independent exercise       Expected Outcomes Short Term: Able to explain why pulse checking is important during independent exercise;Long Term: Able to check pulse independently and accurately       Understanding of Exercise Prescription Yes       Intervention Provide education, explanation, and written materials on patient's individual exercise prescription       Expected Outcomes Short Term: Able to explain program exercise prescription;Long Term: Able to explain home exercise prescription to exercise independently           Exercise Goals Re-Evaluation :  Exercise Goals Re-Evaluation     Row Name 07/01/24 0909             Exercise Goal Re-Evaluation   Exercise Goals Review Increase Physical Activity;Increase  Strength and Stamina;Able to understand and use Dyspnea scale;Able to check pulse independently;Knowledge and understanding of Target Heart Rate Range (THRR);Able to understand and use rate of perceived exertion (RPE) scale;Understanding of Exercise Prescription       Comments Reviewed RPE and dyspnea scale, THR and program prescription with pt today.  Pt voiced understanding and was given a copy of goals to take home.       Expected Outcomes Short: Use RPE daily to regulate intensity.  Long: Follow program prescription in THR.           Discharge Exercise Prescription (Final Exercise Prescription Changes):  Exercise Prescription Changes - 06/29/24 0900       Response to Exercise   Blood Pressure (Admit) 130/66    Blood Pressure (Exercise) 140/60    Blood Pressure (Exit) 132/60    Heart Rate (Admit) 56 bpm    Heart Rate (Exercise) 76 bpm    Heart Rate (Exit) 62 bpm    Oxygen Saturation (Admit) 97 %    Oxygen Saturation (Exercise) 97 %    Oxygen Saturation (Exit) 97 %    Rating of Perceived Exertion (Exercise) 13    Perceived Dyspnea (Exercise) 1          Nutrition:  Target Goals: Understanding of nutrition guidelines, daily intake of sodium 1500mg , cholesterol 200mg , calories 30% from fat and 7% or less from saturated fats, daily to have 5 or more servings of fruits and vegetables.  Biometrics:  Pre Biometrics - 06/29/24 0935       Pre Biometrics   Height 5' 9 (1.753 m)    Weight 208 lb 15.9 oz (94.8 kg)    Waist Circumference 46 inches    Hip Circumference 42 inches    Waist to Hip Ratio 1.1 %    BMI (Calculated) 30.85    Grip Strength 31.8 kg    Single Leg Stand 0 seconds           Nutrition Therapy Plan and Nutrition Goals:   Nutrition  Assessments:  MEDIFICTS Score Key: >=70 Need to make dietary changes  40-70 Heart Healthy Diet <= 40 Therapeutic Level Cholesterol Diet  Flowsheet Row CARDIAC REHAB PHASE II ORIENTATION from 06/29/2024 in [facility]  Picture Your Plate Total Score on Admission 46   Picture Your Plate Scores: <59 Unhealthy dietary pattern with much room for improvement. 41-50 Dietary pattern unlikely to meet recommendations for good health and room for improvement. 51-60 More healthful dietary pattern, with some room for improvement.  >60 Healthy dietary pattern, although there may be some specific behaviors that could be improved.    Nutrition Goals Re-Evaluation:   Nutrition Goals Discharge (Final Nutrition Goals Re-Evaluation):   Psychosocial: Target Goals: Acknowledge presence or absence of significant depression and/or stress, maximize coping skills, provide positive support system. Participant is able to verbalize types and ability to use techniques and skills needed for reducing stress and depression.  Initial Review & Psychosocial Screening:  Initial Psych Review & Screening - 06/29/24 0836       Initial Review   Current issues with None Identified      Family Dynamics   Good Support System? Yes      Barriers   Psychosocial barriers to participate in program The patient should benefit from training in stress management and relaxation.;There are no identifiable barriers or psychosocial needs.      Screening Interventions   Interventions Program counselor consult;To provide support and resources with identified psychosocial  needs;Provide feedback about the scores to participant    Expected Outcomes Short Term goal: Utilizing psychosocial counselor, staff and physician to assist with identification of specific Stressors or current issues interfering with healing process. Setting desired goal for each stressor or current issue identified.;Long Term Goal: Stressors or  current issues are controlled or eliminated.;Short Term goal: Identification and review with participant of any Quality of Life or Depression concerns found by scoring the questionnaire.;Long Term goal: The participant improves quality of Life and PHQ9 Scores as seen by post scores and/or verbalization of changes          Quality of Life Scores:  Quality of Life - 06/29/24 0937       Quality of Life   Select Quality of Life      Quality of Life Scores   Health/Function Pre 26.8 %    Socioeconomic Pre 30 %    Psych/Spiritual Pre 30 %    Family Pre 28.8 %    GLOBAL Pre 28.36 %         Scores of 19 and below usually indicate a poorer quality of life in these areas.  A difference of  2-3 points is a clinically meaningful difference.  A difference of 2-3 points in the total score of the Quality of Life Index has been associated with significant improvement in overall quality of life, self-image, physical symptoms, and general health in studies assessing change in quality of life.  PHQ-9: Review Flowsheet  More data exists      06/29/2024 05/28/2024 03/09/2024 01/07/2024 08/30/2023  Depression screen PHQ 2/9  Decreased Interest 0 0 0 0 0  Down, Depressed, Hopeless 0 0 0 0 0  PHQ - 2 Score 0 0 0 0 0  Altered sleeping 1 - 1 0 0  Tired, decreased energy 1 - 2 0 0  Change in appetite 0 - 0 0 0  Feeling bad or failure about yourself  0 - 0 0 0  Trouble concentrating 1 - 1 0 0  Moving slowly or fidgety/restless 0 - 1 0 0  Suicidal thoughts 0 - 0 0 0  PHQ-9 Score 3 - 5  0  0   Difficult doing work/chores Not difficult at all - Not difficult at all Not difficult at all -    Details       Data saved with a previous flowsheet row definition        Interpretation of Total Score  Total Score Depression Severity:  1-4 = Minimal depression, 5-9 = Mild depression, 10-14 = Moderate depression, 15-19 = Moderately severe depression, 20-27 = Severe depression   Psychosocial Evaluation and  Intervention:  Psychosocial Evaluation - 06/29/24 0838       Psychosocial Evaluation & Interventions   Interventions Stress management education;Relaxation education;Encouraged to exercise with the program and follow exercise prescription    Comments Patient was referred to cardiac rehab with CABGx1. He completed the program in 2017 with a stent. He denies any depression, anxiety or stressors. He reports sleeping okay. He usually sleeps in a recliner due to his chronic bilateral hip and back pain. He lives with is wife who is his main support along with his 2 adult sons. He has an adopted son age 47. 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 He is very active in his church and does volunteer work in a advertising copywriter. His goals for the program to make sure his heart is doing okay, his heart  rate is good and he wants to be as active as he can.  His chronic pain could be a barrier for him but he hopes to be able to exercise without increasing his pain.    Expected Outcomes Short Term: Patient will start the program and attend consistently. Long Term: Patient will complete the program meeting personal goals.    Continue Psychosocial Services  Follow up required by staff          Psychosocial Re-Evaluation:   Psychosocial Discharge (Final Psychosocial Re-Evaluation):   Vocational Rehabilitation: Provide vocational rehab assistance to qualifying candidates.   Vocational Rehab Evaluation & Intervention:  Vocational Rehab - 06/29/24 [phone]       Initial Vocational Rehab Evaluation & Intervention   Assessment shows need for Vocational Rehabilitation No      Vocational Rehab Re-Evaulation   Comments Patient is retired.          Education: Education Goals: Education classes will be provided on a weekly basis, covering required topics. Participant will state understanding/return demonstration of topics presented.  Learning Barriers/Preferences:  Learning Barriers/Preferences -  06/29/24 0834       Learning Barriers/Preferences   Learning Barriers None    Learning Preferences Skilled Demonstration          Education Topics: Hypertension, Hypertension Reduction -Define heart disease and high blood pressure. Discus how high blood pressure affects the body and ways to reduce high blood pressure.   Exercise and Your Heart -Discuss why it is important to exercise, the FITT principles of exercise, normal and abnormal responses to exercise, and how to exercise safely. Flowsheet Row CARDIAC REHAB PHASE II EXERCISE from 04/04/2016 in [place] [facility]  Date 03/14/16  Educator DC  Instruction Review Code (retired) 2- meets goals/outcomes    Angina -Discuss definition of angina, causes of angina, treatment of angina, and how to decrease risk of having angina. Flowsheet Row CARDIAC REHAB PHASE II EXERCISE from 04/04/2016 in [place] [facility]  Date 03/21/16  Educator Loa Sheets  Instruction Review Code 2- meets goals/outcomes    Cardiac Medications -Review what the following cardiac medications are used for, how they affect the body, and side effects that may occur when taking the medications.  Medications include Aspirin , Beta blockers, calcium  channel blockers, ACE Inhibitors, angiotensin receptor blockers, diuretics, digoxin, and antihyperlipidemics. Flowsheet Row CARDIAC REHAB PHASE II EXERCISE from 04/04/2016 in [place] [facility]  Date 03/28/16  Educator Loa Sheets  Instruction Review Code 2- meets goals/outcomes    Congestive Heart Failure -Discuss the definition of CHF, how to live with CHF, the signs and symptoms of CHF, and how keep track of weight and sodium intake. Flowsheet Row CARDIAC REHAB PHASE II EXERCISE from 04/04/2016 in [place] [facility]  Date 04/04/16  Educator DC  Instruction Review Code 2- meets goals/outcomes    Heart Disease and Intimacy -Discus the effect sexual activity  has on the heart, how changes occur during intimacy as we age, and safety during sexual activity. Flowsheet Row CARDIAC REHAB PHASE II EXERCISE from 04/04/2016 in [place] [facility]  Date 01/11/16  Educator DC  Instruction Review Code (retired) 2- meets goals/outcomes    Smoking Cessation / COPD -Discuss different methods to quit smoking, the health benefits of quitting smoking, and the definition of COPD. Flowsheet Row CARDIAC REHAB PHASE II EXERCISE from 04/04/2016 in [place] [facility]  Date 01/18/16  Educator Loa Sheets  Instruction Review Code (retired) 2- meets goals/outcomes  Nutrition I: Fats -Discuss the types of cholesterol, what cholesterol does to the heart, and how cholesterol levels can be controlled. Flowsheet Row CARDIAC REHAB PHASE II EXERCISE from 04/04/2016 in [place] [facility]  Date 01/25/16  Educator Loa Sheets  Instruction Review Code 2- meets goals/outcomes    Nutrition II: Labels -Discuss the different components of food labels and how to read food label Flowsheet Row CARDIAC REHAB PHASE II EXERCISE from 04/04/2016 in [place] [facility]  Date 02/01/16  Educator Loa Sheets  Instruction Review Code 2- meets goals/outcomes    Heart Parts/Heart Disease and PAD -Discuss the anatomy of the heart, the pathway of blood circulation through the heart, and these are affected by heart disease. Flowsheet Row CARDIAC REHAB PHASE II EXERCISE from 04/04/2016 in [place] [facility]  Date 02/08/16  Educator DC  Instruction Review Code 2- meets goals/outcomes    Stress I: Signs and Symptoms -Discuss the causes of stress, how stress may lead to anxiety and depression, and ways to limit stress.   Stress II: Relaxation -Discuss different types of relaxation techniques to limit stress.   Warning Signs of Stroke / TIA -Discuss definition of a stroke, what the signs and symptoms are of a stroke, and  how to identify when someone is having stroke. Flowsheet Row CARDIAC REHAB PHASE II EXERCISE from 04/04/2016 in [place] [facility]  Date 02/29/16  Educator DJ/DC  Instruction Review Code 2- meets goals/outcomes    Knowledge Questionnaire Score:  Knowledge Questionnaire Score - 06/29/24 0911       Knowledge Questionnaire Score   Pre Score 18/26          Core Components/Risk Factors/Patient Goals at Admission:  Personal Goals and Risk Factors at Admission - 06/29/24 0835       Core Components/Risk Factors/Patient Goals on Admission    Weight Management Weight Maintenance    Improve shortness of breath with ADL's Yes    Intervention Provide education, individualized exercise plan and daily activity instruction to help decrease symptoms of SOB with activities of daily living.    Expected Outcomes Short Term: Improve cardiorespiratory fitness to achieve a reduction of symptoms when performing ADLs;Long Term: Be able to perform more ADLs without symptoms or delay the onset of symptoms    Hypertension Yes    Intervention Provide education on lifestyle modifcations including regular physical activity/exercise, weight management, moderate sodium restriction and increased consumption of fresh fruit, vegetables, and low fat dairy, alcohol moderation, and smoking cessation.;Monitor prescription use compliance.    Expected Outcomes Short Term: Continued assessment and intervention until BP is < 140/3mm HG in hypertensive participants. < 130/44mm HG in hypertensive participants with diabetes, heart failure or chronic kidney disease.;Long Term: Maintenance of blood pressure at goal levels.    Lipids Yes    Intervention Provide education and support for participant on nutrition & aerobic/resistive exercise along with prescribed medications to achieve LDL 70mg , HDL >40mg .    Expected Outcomes Short Term: Participant states understanding of desired cholesterol values and is compliant  with medications prescribed. Participant is following exercise prescription and nutrition guidelines.;Long Term: Cholesterol controlled with medications as prescribed, with individualized exercise RX and with personalized nutrition plan. Value goals: LDL < 70mg , HDL > 40 mg.          Core Components/Risk Factors/Patient Goals Review:    Core Components/Risk Factors/Patient Goals at Discharge (Final Review):    ITP Comments:  ITP Comments     Row Name 06/29/24 1146  07/01/24 0909 07/07/24 0919       ITP Comments Patient arrived for 1st visit/orientation/education at 0800. Patient was referred to CR by Dr. James Hochrien/[name] attending MD due to Status Post CABGx1. During orientation advised patient on arrival and appointment times what to wear, what to do before, during and after exercise. Reviewed attendance and class policy.  Pt is scheduled to return Cardiac Rehab on 07/01/24 at 915. Pt was advised to come to class 15 minutes before class starts.  Discussed RPE/Dpysnea scales. Patient participated in warm up stretches. Patient was able to complete 6 minute walk test.  Telemetry:NSR. Patient was measured for the equipment. Discussed equipment safety with patient. Took patient pre-anthropometric measurements. Patient finished visit at 930. First full day of exercise!  Patient was oriented to gym and equipment including functions, settings, policies, and procedures.  Patient's individual exercise prescription and treatment plan were reviewed.  All starting workloads were established based on the results of the 6 minute walk test done at initial orientation visit.  The plan for exercise progression was also introduced and progression will be customized based on patient's performance and goals. 30 day review completed. ITP sent to Dr. Dorn Ross, Medical Director of Cardiac Rehab. Continue with ITP unless changes are made by physician.  New to program        Comments: 30 day review

## 2024-07-07 NOTE — Progress Notes (Signed)
 Jeffrey Hooper 63 y.o. 1960-08-21 [id number]  Assessment & Plan:   Encounter Diagnoses  Name Primary?   Abdominal pain, chronic, epigastric Yes   Early satiety    Dyspepsia    Bleeding internal hemorrhoids    Pruritus ani      Chronic epigastric pressure and early satiety.  I think this has been present before looking at the chart he has had left upper quadrant discomfort also.  Differential includes gastritis or functional dyspepsia. Upper GI test normal, weight stable. - Prescribed buspirone  10 mg twice daily to see if we can treat early satiety and suspected impaired gastric accommodation and improve dyspepsia. - Monitor for side effects such as sleepiness or bad dreams. - Continue current acid suppression  Hemorrhoids with pruritus ani Chronic hemorrhoids with intermittent pruritus ani and occasional minor bleeding. Symptoms improved but persist. No significant pain or prolapse. - Prescribed hydrocortisone  cream to see if that will help his symptoms.  Depending upon clinical response regarding pruritus ani consider the following:  Stronger topical steroids for short-term use Tacrolimus ointment (0.1%),  Capsaicin cream (0.006%)   He will return in January for follow-up.  CC: [name], DO  Subjective:   Chief Complaint: Epigastric pain/pressure hemorrhoids, anal itching  HPI Discussed the use of AI scribe software for clinical note transcription with the patient, who gave verbal consent to proceed.  Jeffrey Hooper is a 63 year old male with a history of open heart surgery who presents with persistent epigastric pressure and gastrointestinal symptoms.  The patient just underwent coronary artery bypass grafting in October.  He was seen here previously because of abnormal transaminases but those have resolved.  Acute hepatitis panel was negative at that time.  Celiac labs negative.  Epigastric pressure and gastrointestinal symptoms - Persistent pressure  in the epigastric region, described as a sensation of fullness at the bottom of the chest and top of the stomach - Symptoms present most of the time and have not resolved since open heart surgery in October - Early satiety affecting ability to eat, but weight remains stable - Known small hiatal hernia - upper GI series with small bowel follow-through performed, ordered by Lauraine Furbish PA-C February 12, 2024 small type I hiatal hernia intact primary peristalsis in the esophagus, no gastric abnormality, normal small bowel.   - Occasional heartburn and sensation of food regurgitation, not triggered by specific foods - Currently taking acid-blocking medication  Hemorrhoidal symptoms - Occasional itching and minor bleeding with wiping - Uses topical calmiseptine and a compounded medication as needed - Bowel movements are regular and not particularly hard  Sleep disturbances - Intermittent sleep disturbances attributed to medication causing nocturia - Symptoms have been ongoing for some time  Dietary restrictions - Alpha-gal syndrome requiring avoidance of red meat and also has allergy  to shellfish - Diet limited to chicken and fish - Dairy intake is limited, rare milk with cereal  Other chronic symptoms - Tinnitus managed with paroxetine -an attempt to relieve distress associated with chronic tinnitus - Uses a cane since 2017 due to back issues and rods in both legs        Wt Readings from Last 3 Encounters:  07/07/24 209 lb (94.8 kg)  06/29/24 208 lb 15.9 oz (94.8 kg)  06/26/24 210 lb (95.3 kg)   209# May25  Cardiothoracic surgery visit 06/26/2024 reviewed. Previous GI Procedures/Imaging    RUQ US  01/08/2024 - Increased hepatic echogenicity, a nonspecific finding that is most commonly seen on the  basis of steatosis in the absence of known liver disease. - Otherwise unremarkable exam.   CT entero A/P 10/12/2022 - No acute findings. - No radiographic evidence of intussusception,  inflammatory bowel disease, or other intestinal pathology. - Aortic Atherosclerosis (ICD10-I70.0).   GES 09/07/2022 - Normal gastric emptying study   EGD 12/13/2020 (Dr. Teressa) - Mild to moderate, non- specific gastritis. Biopsied.  - The examination was otherwise normal. Path: Surgical [P], gastric antrum and gastric body - MILD CHRONIC GASTRITIS. - WARTHIN-STARRY STAIN IS NEGATIVE FOR HELICOBACTER PYLORI.   Colonoscopy 12/13/2020 - The examined portion of the ileum was normal.  - Internal hemorrhoids.  - The examination was otherwise normal on direct and retroflexion views.  - No polyps or cancers. - Recall 10 years Allergies  Allergen Reactions   Alpha-Gal Rash and Other (See Comments)    SEVERE ALLERGY  TO ANY MEAT OR MEAT DERIVED PRODUCTS FROM 4 LEGGED ANIMALS > > BEEF, PORK , GOATS, DEER, ETC. < < RESULT OF BITE FROM LONE STAR TICK   Bovine (Beef) Protein Rash and Other (See Comments)   Bovine (Beef) Protein-Containing Drug Products Rash and Other (See Comments)   Lambs Quarters Rash and Other (See Comments)   Porcine (Pork) Protein-Containing Drug Products Rash and Other (See Comments)   Prednisone  Other (See Comments)    im depo medrol  caused dizziness - pt passed out. Ask pt before giving. Pt can take oral prednisone     Brilinta  [Ticagrelor ] Hives    Pt ate pork on the same day he took Brilinta  before knowing he had alpha gal. May have been the alpha gal reaction, pt is unsure.    Lyrica [Pregabalin]     Dry mouth and felt raw   Methylprednisolone  Sodium Succ     im depo medrol  caused dizziness - pt passed out. Ask pt before giving. Issue occurred when administered in the left arm    Penicillins Hives    Has tolerated since   Shellfish Allergy  Hives   Adhesive [Tape] Rash   Doxycycline Hives, Swelling and Rash   Fentanyl  Rash    Reaction to adhesive, not the drug    Current Meds  Medication Sig   acetaminophen  (TYLENOL ) 500 MG tablet Take 1,000 mg by mouth every  8 (eight) hours as needed for moderate pain (pain score 4-6).   albuterol  (VENTOLIN  HFA) 108 (90 Base) MCG/ACT inhaler TAKE 2 PUFFS BY MOUTH EVERY 6 HOURS AS NEEDED FOR WHEEZE OR SHORTNESS OF BREATH   amiodarone  (PACERONE ) 200 MG tablet Take 1 tablet (200 mg total) by mouth 2 (two) times daily. (Patient taking differently: Take 200 mg by mouth daily.)   amLODipine  (NORVASC ) 2.5 MG tablet Take 1 tablet (2.5 mg total) by mouth daily.   aspirin  EC 325 MG tablet Take 1 tablet (325 mg total) by mouth daily.   atorvastatin  (LIPITOR ) 80 MG tablet TAKE 1 TABLET BY MOUTH EVERYDAY AT BEDTIME   cetirizine  (ZYRTEC ) 10 MG tablet Take 10 mg by mouth daily.   EPINEPHrine  0.3 mg/0.3 mL IJ SOAJ injection Inject 0.3 mg into the muscle as needed for anaphylaxis.   ezetimibe  (ZETIA ) 10 MG tablet Take 1 tablet (10 mg total) by mouth daily.   fluticasone  (FLONASE ) 50 MCG/ACT nasal spray Place 2 sprays into both nostrils daily as needed for allergies or rhinitis.   levothyroxine  (SYNTHROID ) 100 MCG tablet Take 1 tablet (100 mcg total) by mouth daily.   MAGNESIUM  GLYCINATE PO Take 2 tablets by mouth at bedtime.   olmesartan  (  BENICAR ) 20 MG tablet Take 1 tablet (20 mg total) by mouth daily.   ondansetron  (ZOFRAN -ODT) 4 MG disintegrating tablet TAKE 1 TABLET BY MOUTH EVERY 8 HOURS AS NEEDED FOR NAUSEA AND VOMITING   pantoprazole  (PROTONIX ) 40 MG tablet Take 40 mg by mouth 2 (two) times daily.   PARoxetine  (PAXIL ) 20 MG tablet Take 1 tablet (20 mg total) by mouth daily.   Current Facility-Administered Medications for the 07/07/24 encounter (Office Visit) with Avram Lupita BRAVO, MD  Medication   cyanocobalamin  (VITAMIN B12) injection 1,000 mcg   Past Medical History:  Diagnosis Date   Abnormal nuclear stress test 12/02/2015   Allergy     Allergy  to alpha-gal    Arthritis    CAD (coronary artery disease), native coronary artery 12/03/2015   Coronary artery disease    Difficult intubation    anterior larynx and limited  oral opening, glidescope used electively   Dysrhythmia    PACs and SVT   GERD (gastroesophageal reflux disease)    Head injury with loss of consciousness (HCC)    back in the 1980's   History of hiatal hernia    History of kidney stones    Hyperlipidemia    Hypertension    Hypothyroid    Multiple benign nevi    Neuromuscular disorder (HCC) 2011   LEFT RADIAL NERVE SURGERY R/T TRAUMA   Pneumonia 2014, 2017   Prostatism    Pulmonary nodule    Spinal cord stimulator status    told to bring remote to surgery   Past Surgical History:  Procedure Laterality Date   BACK SURGERY     T12 - L1, 11 times including neck   CARDIAC CATHETERIZATION N/A 12/02/2015   Procedure: Left Heart Cath and Coronary Angiography;  Surgeon: Peter M Jordan, MD;  Location: [facility] INVASIVE CV LAB;  Service: Cardiovascular;  Laterality: N/A;   CARDIAC CATHETERIZATION N/A 12/02/2015   Procedure: Coronary Stent Intervention;  Surgeon: Peter M Jordan, MD;  Location: [facility] INVASIVE CV LAB;  Service: Cardiovascular;  Laterality: N/A;  mid LAD Promus 2.5x12   CERVICAL FUSION  2023   C4 and C5   COLONOSCOPY     CORONARY ANGIOPLASTY     one stent placed by Dr. P Jordan   CORONARY ARTERY BYPASS GRAFT N/A 05/21/2024   Procedure: OFF PUMP CORONARY ARTERY BYPASS GRAFTING (CABG) TIMES ONE UTILIZING LEFT RADIAL ARTERY;  Surgeon: Shyrl Linnie KIDD, MD;  Location: [facility] OR;  Service: Open Heart Surgery;  Laterality: N/A;   CORONARY CTO INTERVENTION N/A 03/19/2024   Procedure: CORONARY CTO INTERVENTION;  Surgeon: Jordan, Peter M, MD;  Location: [facility] INVASIVE CV LAB;  Service: Cardiovascular;  Laterality: N/A;   HARDWARE REMOVAL Right 08/26/2017   Procedure: Right Lumbar Five Revision of pedicle screw with Removal of Lumbar Five Screw;  Surgeon: Colon Shove, MD;  Location: [facility] OR;  Service: Neurosurgery;  Laterality: Right;  posterior   HARDWARE REMOVAL Right 12/27/2017   Procedure: Right Lumbar Two, Lumbar Three, Lumbar Four Pedicle  screw removal with metrex;  Surgeon: Colon Shove, MD;  Location: [facility] OR;  Service: Neurosurgery;  Laterality: Right;  Right L2 to L4 Pedicle screw removal with mterex   HEMORRHOID BANDING     INTRAOPERATIVE TRANSESOPHAGEAL ECHOCARDIOGRAM N/A 05/21/2024   Procedure: ECHOCARDIOGRAM, TRANSESOPHAGEAL, INTRAOPERATIVE;  Surgeon: Shyrl Linnie KIDD, MD;  Location: [facility] OR;  Service: Open Heart Surgery;  Laterality: N/A;   LAPAROSCOPY N/A 09/01/2020   Procedure: LAPAROSCOPY DIAGNOSTIC;  Surgeon: Debby Hila, MD;  Location: [facility] ORS;  Service: General;  Laterality: N/A;   LEFT HEART CATH AND CORONARY ANGIOGRAPHY N/A 02/26/2024   Procedure: LEFT HEART CATH AND CORONARY ANGIOGRAPHY;  Surgeon: Jordan, Peter M, MD;  Location: [facility] INVAS[facility] CV LAB;  Service: Cardiovascular;  Laterality: N/A;   MASS EXCISION  06/25/2012   Procedure: EXCISION MASS;  Surgeon: Arley JONELLE Curia, MD;  Location: [facility];  Service: Orthopedics;  Laterality: Left;  transection of NEUROMA, BURYING RADIAL NERVE IN BRACHIORADIALIS LEFT SIDE   RADIAL ARTERY HARVEST Left 05/21/2024   Procedure: SURGICAL PROCUREMENT, ARTERY, RADIAL;  Surgeon: Shyrl Linnie KIDD, MD;  Location: [facility] OR;  Service: Open Heart Surgery;  Laterality: Left;   radial nerve Left    cut in work injury   RIGHT GREAT TOENAIL REMOVAL  04/2020   SPINAL FUSION     C6-7   TONSILLECTOMY  1982   UMBILICAL HERNIA REPAIR N/A 09/01/2020   Procedure: PRIMARY UMBILICAL HERNIA REPAIR;  Surgeon: Debby Hila, MD;  Location: [facility] ORS;  Service: General;  Laterality: N/A;   VASECTOMY     Social History   Social History Narrative   Lives at home with wife, not working disabled from the city of [place]   They have an adopted son at home (age 68  2022)   Former smoker no alcohol tobacco or drug use now   family history includes Congestive Heart Failure in his mother; Heart attack in his paternal grandfather and paternal grandmother; Heart attack (age of onset: 79)  in his father; Heart failure (age of onset: 65) in his mother.   Review of Systems As per HPI  Objective:   Physical Exam BP 120/70   Pulse (!) 50   Ht 5' 10 (1.778 m)   Wt 209 lb (94.8 kg)   BMI 29.99 kg/m  Abdomen is soft and nontender Examination of the rectum reveals some skin discoloration a little bit violaceous without acute changes, this is similar to what have seen in the past.  Digital exam shows palpable internal hemorrhoids no mass.

## 2024-07-08 ENCOUNTER — Encounter (HOSPITAL_COMMUNITY)
Admission: RE | Admit: 2024-07-08 | Discharge: 2024-07-08 | Disposition: A | Source: Ambulatory Visit | Attending: Cardiology

## 2024-07-08 DIAGNOSIS — Z951 Presence of aortocoronary bypass graft: Secondary | ICD-10-CM

## 2024-07-08 NOTE — Progress Notes (Signed)
 Daily Session Note  Patient Details  Name: Jeffrey Hooper MRN: [id number] Date of Birth: 1961/05/02 Referring Provider:   Flowsheet Row CARDIAC REHAB PHASE II ORIENTATION from 06/29/2024 in [facility] CARDIAC REHABILITATION  Referring Provider Jordan, Peter MD    Encounter Date: 07/08/2024  Check In:  Session Check In - 07/08/24 0915       Check-In   Supervising physician immediately available to respond to emergencies See telemetry face sheet for immediately available MD    Location [facility]    Staff Present Rolland Sake BSN, RN;Tobie Hellen Vicci, RN, BSN;Heather Con, BS, Exercise Physiologist;Brittany Jackquline, BSN, RN, WTA-C    Virtual Visit No    Medication changes reported     Yes    Comments MD added Buspar 10 mg BID for GI issues.    Fall or balance concerns reported    No    Warm-up and Cool-down Performed on first and last piece of equipment    Resistance Training Performed Yes    VAD Patient? No    PAD/SET Patient? No      Pain Assessment   Currently in Pain? No/denies    Multiple Pain Sites No          Capillary Blood Glucose: No results found for this or any previous visit (from the past 24 hours).    Social History   Tobacco Use  Smoking Status Former   Current packs/day: 0.00   Average packs/day: 1 pack/day for 20.0 years (20.0 ttl pk-yrs)   Types: Cigarettes   Start date: 11/28/1986   Quit date: 11/28/2006   Years since quitting: 17.6  Smokeless Tobacco Never    Goals Met:  Independence with exercise equipment Exercise tolerated well No report of concerns or symptoms today Strength training completed today  Goals Unmet:  Not Applicable  Comments: Pt able to follow exercise prescription today without complaint.  Will continue to monitor for progression.

## 2024-07-09 ENCOUNTER — Encounter: Payer: Self-pay | Admitting: Cardiology

## 2024-07-09 ENCOUNTER — Ambulatory Visit

## 2024-07-09 ENCOUNTER — Ambulatory Visit: Attending: Cardiology | Admitting: Cardiology

## 2024-07-09 VITALS — BP 148/78 | HR 59 | Ht 69.5 in | Wt 211.8 lb

## 2024-07-09 DIAGNOSIS — I10 Essential (primary) hypertension: Secondary | ICD-10-CM | POA: Insufficient documentation

## 2024-07-09 DIAGNOSIS — I480 Paroxysmal atrial fibrillation: Secondary | ICD-10-CM

## 2024-07-09 DIAGNOSIS — Z951 Presence of aortocoronary bypass graft: Secondary | ICD-10-CM | POA: Diagnosis present

## 2024-07-09 DIAGNOSIS — E785 Hyperlipidemia, unspecified: Secondary | ICD-10-CM | POA: Diagnosis not present

## 2024-07-09 DIAGNOSIS — Z9861 Coronary angioplasty status: Secondary | ICD-10-CM | POA: Insufficient documentation

## 2024-07-09 DIAGNOSIS — I2510 Atherosclerotic heart disease of native coronary artery without angina pectoris: Secondary | ICD-10-CM | POA: Insufficient documentation

## 2024-07-09 MED ORDER — AMIODARONE HCL 200 MG PO TABS: 200.0000 mg | ORAL_TABLET | Freq: Every day | ORAL | 3 refills | Status: DC

## 2024-07-09 MED ORDER — OLMESARTAN MEDOXOMIL 40 MG PO TABS: 40.0000 mg | ORAL_TABLET | Freq: Every day | ORAL | 3 refills | Status: AC

## 2024-07-09 NOTE — Progress Notes (Unsigned)
 Enrolled patient for a 14 day Zio XT  monitor to be mailed to patients home

## 2024-07-09 NOTE — Patient Instructions (Addendum)
 Medication Instructions:  Increase Olmesartan  to 40 mg daily Continue all other medications *If you need a refill on your cardiac medications before your next appointment, please call your pharmacy*  Lab Work: None ordered   Testing/Procedures: 2 week Heart Monitor will be mailed to your home with instructions  Follow-Up: At [facility], you and your health needs are our priority.  As part of our continuing mission to provide you with exceptional heart care, our providers are all part of one team.  This team includes your primary Cardiologist (physician) and Advanced Practice Providers or APPs (Physician Assistants and Nurse Practitioners) who all work together to provide you with the care you need, when you need it.  Your next appointment:  3 months    Provider:  Dr.Jordan    We recommend signing up for the patient portal called MyChart.  Sign up information is provided on this After Visit Summary.  MyChart is used to connect with patients for Virtual Visits (Telemedicine).  Patients are able to view lab/test results, encounter notes, upcoming appointments, etc.  Non-urgent messages can be sent to your provider as well.   To learn more about what you can do with MyChart, go to [email or web address].

## 2024-07-10 ENCOUNTER — Encounter (HOSPITAL_COMMUNITY)
Admission: RE | Admit: 2024-07-10 | Discharge: 2024-07-10 | Disposition: A | Discharge status: Home / Self Care | Source: Ambulatory Visit | Attending: Cardiology | Admitting: Cardiology

## 2024-07-10 DIAGNOSIS — Z951 Presence of aortocoronary bypass graft: Secondary | ICD-10-CM | POA: Diagnosis not present

## 2024-07-10 NOTE — Progress Notes (Signed)
 Daily Session Note  Patient Details  Name: Jeffrey Hooper MRN: [id number] Date of Birth: 01/11/1961 Referring Provider:   Flowsheet Row CARDIAC REHAB PHASE II ORIENTATION from 06/29/2024 in [facility] CARDIAC REHABILITATION  Referring Provider Jordan, Peter MD    Encounter Date: 07/10/2024  Check In:  Session Check In - 07/10/24 0856       Check-In   Supervising physician immediately available to respond to emergencies See telemetry face sheet for immediately available MD    Location [facility]    Staff Present Laymon Rattler, BSN, RN, WTA-C;Heather Con, BS, Exercise Physiologist;Jessica Vonzell, MA, RCEP, CCRP, CCET    Virtual Visit No    Medication changes reported     No    Fall or balance concerns reported    No    Tobacco Cessation No Change    Warm-up and Cool-down Performed on first and last piece of equipment    Resistance Training Performed Yes    VAD Patient? No    PAD/SET Patient? No      Pain Assessment   Currently in Pain? No/denies          Capillary Blood Glucose: No results found for this or any previous visit (from the past 24 hours).    Social History   Tobacco Use  Smoking Status Former   Current packs/day: 0.00   Average packs/day: 1 pack/day for 20.0 years (20.0 ttl pk-yrs)   Types: Cigarettes   Start date: 11/28/1986   Quit date: 11/28/2006   Years since quitting: 17.6  Smokeless Tobacco Never    Goals Met:  Independence with exercise equipment Exercise tolerated well No report of concerns or symptoms today Strength training completed today  Goals Unmet:  Not Applicable  Comments: Pt able to follow exercise prescription today without complaint.  Will continue to monitor for progression.

## 2024-07-13 ENCOUNTER — Encounter (HOSPITAL_COMMUNITY)
Admission: RE | Admit: 2024-07-13 | Discharge: 2024-07-13 | Disposition: A | Source: Ambulatory Visit | Attending: Cardiology

## 2024-07-13 DIAGNOSIS — Z951 Presence of aortocoronary bypass graft: Secondary | ICD-10-CM | POA: Diagnosis not present

## 2024-07-13 NOTE — Progress Notes (Signed)
 Daily Session Note  Patient Details  Name: Jeffrey Hooper MRN: [id number] Date of Birth: [birth year] Referring Provider:   Flowsheet Row CARDIAC REHAB PHASE II ORIENTATION from 06/29/2024 in [facility] CARDIAC REHABILITATION  Referring Provider Jordan, Peter MD    Encounter Date: 07/13/2024  Check In:  Session Check In - 07/13/24 1056       Check-In   Supervising physician immediately available to respond to emergencies See telemetry face sheet for immediately available MD    Location [facility]    Staff Present Powell Benders, BS, Exercise Physiologist;Victoria Zina, RN;Brittany Jackquline, BSN, RN, WTA-C    Virtual Visit No    Medication changes reported     No    Fall or balance concerns reported    No    Tobacco Cessation No Change    Warm-up and Cool-down Performed on first and last piece of equipment    Resistance Training Performed Yes    VAD Patient? No    PAD/SET Patient? No      Pain Assessment   Currently in Pain? No/denies    Pain Score 0-No pain    Multiple Pain Sites No          Capillary Blood Glucose: No results found for this or any previous visit (from the past 24 hours).    Social History   Tobacco Use  Smoking Status Former   Current packs/day: 0.00   Average packs/day: 1 pack/day for 20.0 years (20.0 ttl pk-yrs)   Types: Cigarettes   Start date: 11/28/1986   Quit date: 11/28/2006   Years since quitting: 17.6  Smokeless Tobacco Never    Goals Met:  Independence with exercise equipment Exercise tolerated well No report of concerns or symptoms today Strength training completed today  Goals Unmet:  Not Applicable  Comments: Pt able to follow exercise prescription today without complaint.  Will continue to monitor for progression.

## 2024-07-15 ENCOUNTER — Encounter (HOSPITAL_COMMUNITY)
Admission: RE | Admit: 2024-07-15 | Discharge: 2024-07-15 | Disposition: A | Discharge status: Home / Self Care | Source: Ambulatory Visit | Attending: Cardiology | Admitting: Cardiology

## 2024-07-15 DIAGNOSIS — Z951 Presence of aortocoronary bypass graft: Secondary | ICD-10-CM | POA: Diagnosis not present

## 2024-07-15 NOTE — Progress Notes (Signed)
 Daily Session Note  Patient Details  Name: Jeffrey Hooper MRN: [id number] Date of Birth: 1961-02-03 Referring Provider:   Flowsheet Row CARDIAC REHAB PHASE II ORIENTATION from 06/29/2024 in [facility] CARDIAC REHABILITATION  Referring Provider Jordan, Peter MD    Encounter Date: 07/15/2024  Check In:  Session Check In - 07/15/24 1421       Check-In   Supervising physician immediately available to respond to emergencies See telemetry face sheet for immediately available MD    Location [facility]    Staff Present Powell Benders, BS, Exercise Physiologist;Hillary Troutman BSN, RN;Brittany Jackquline, BSN, RN, Oddis Louder, RN, BSN    Medication changes reported     No    Tobacco Cessation No Change    Resistance Training Performed Yes    VAD Patient? No    PAD/SET Patient? No      Pain Assessment   Currently in Pain? No/denies    Pain Score 0-No pain    Multiple Pain Sites No          Capillary Blood Glucose: No results found for this or any previous visit (from the past 24 hours).    Social History   Tobacco Use  Smoking Status Former   Current packs/day: 0.00   Average packs/day: 1 pack/day for 20.0 years (20.0 ttl pk-yrs)   Types: Cigarettes   Start date: 11/28/1986   Quit date: 11/28/2006   Years since quitting: 17.6  Smokeless Tobacco Never    Goals Met:  Independence with exercise equipment Exercise tolerated well No report of concerns or symptoms today Strength training completed today  Goals Unmet:  Not Applicable  Comments: Pt able to follow exercise prescription today without complaint.  Will continue to monitor for progression.

## 2024-07-17 ENCOUNTER — Encounter (HOSPITAL_COMMUNITY)
Admission: RE | Admit: 2024-07-17 | Discharge: 2024-07-17 | Disposition: A | Source: Ambulatory Visit | Attending: Cardiology

## 2024-07-17 DIAGNOSIS — Z951 Presence of aortocoronary bypass graft: Secondary | ICD-10-CM

## 2024-07-17 NOTE — Progress Notes (Signed)
 Daily Session Note  Patient Details  Name: Jeffrey Hooper MRN: [id number] Date of Birth: 1961-05-17 Referring Provider:   Flowsheet Row CARDIAC REHAB PHASE II ORIENTATION from 06/29/2024 in [facility] CARDIAC REHABILITATION  Referring Provider Jordan, Peter MD    Encounter Date: 07/17/2024  Check In:  Session Check In - 07/17/24 0900       Check-In   Supervising physician immediately available to respond to emergencies See telemetry face sheet for immediately available MD    Location [facility]    Staff Present Laymon Rattler, BSN, RN, WTA-C;Heather Con, BS, Exercise Physiologist;Victoria Zina, RN    Virtual Visit No    Medication changes reported     No    Fall or balance concerns reported    No    Tobacco Cessation No Change    Warm-up and Cool-down Performed on first and last piece of equipment    Resistance Training Performed Yes    VAD Patient? No    PAD/SET Patient? No      Pain Assessment   Currently in Pain? No/denies          Capillary Blood Glucose: No results found for this or any previous visit (from the past 24 hours).    Tobacco Use History[1]  Goals Met:  Independence with exercise equipment Exercise tolerated well No report of concerns or symptoms today Strength training completed today  Goals Unmet:  Not Applicable  Comments: Pt able to follow exercise prescription today without complaint.  Will continue to monitor for progression.        [1]  Social History Tobacco Use  Smoking Status Former   Current packs/day: 0.00   Average packs/day: 1 pack/day for 20.0 years (20.0 ttl pk-yrs)   Types: Cigarettes   Start date: 11/28/1986   Quit date: 11/28/2006   Years since quitting: 17.6  Smokeless Tobacco Never

## 2024-07-20 ENCOUNTER — Encounter (HOSPITAL_COMMUNITY)
Admission: RE | Admit: 2024-07-20 | Discharge: 2024-07-20 | Disposition: A | Source: Ambulatory Visit | Attending: Cardiology

## 2024-07-20 ENCOUNTER — Other Ambulatory Visit: Payer: Self-pay | Admitting: Surgical

## 2024-07-20 DIAGNOSIS — Z951 Presence of aortocoronary bypass graft: Secondary | ICD-10-CM

## 2024-07-20 NOTE — Progress Notes (Signed)
 Daily Session Note  Patient Details  Name: Jeffrey Hooper MRN: [id number] Date of Birth: Mar 16, 1961 Referring Provider:   Flowsheet Row CARDIAC REHAB PHASE II ORIENTATION from 06/29/2024 in [facility] CARDIAC REHABILITATION  Referring Provider Jordan, Peter MD    Encounter Date: 07/20/2024  Check In:  Session Check In - 07/20/24 0850       Check-In   Supervising physician immediately available to respond to emergencies See telemetry face sheet for immediately available MD    Location [facility]    Staff Present Laymon Rattler, BSN, RN, WTA-C;Heather Con, BS, Exercise Physiologist;Victoria Zina, RN    Virtual Visit No    Medication changes reported     No    Fall or balance concerns reported    No    Tobacco Cessation No Change    Warm-up and Cool-down Performed on first and last piece of equipment    Resistance Training Performed Yes    VAD Patient? No    PAD/SET Patient? No      Pain Assessment   Currently in Pain? No/denies          Capillary Blood Glucose: No results found for this or any previous visit (from the past 24 hours).    Tobacco Use History[1]  Goals Met:  Independence with exercise equipment Exercise tolerated well No report of concerns or symptoms today Strength training completed today  Goals Unmet:  Not Applicable  Comments: Pt able to follow exercise prescription today without complaint.  Will continue to monitor for progression.        [1]  Social History Tobacco Use  Smoking Status Former   Current packs/day: 0.00   Average packs/day: 1 pack/day for 20.0 years (20.0 ttl pk-yrs)   Types: Cigarettes   Start date: 11/28/1986   Quit date: 11/28/2006   Years since quitting: 17.6  Smokeless Tobacco Never

## 2024-07-22 ENCOUNTER — Ambulatory Visit (INDEPENDENT_AMBULATORY_CARE_PROVIDER_SITE_OTHER): Admitting: *Deleted

## 2024-07-22 ENCOUNTER — Ambulatory Visit: Payer: Self-pay

## 2024-07-22 ENCOUNTER — Encounter (HOSPITAL_COMMUNITY): Admission: RE | Admit: 2024-07-22 | Discharge: 2024-07-22 | Attending: Cardiology

## 2024-07-22 VITALS — BP 137/72 | HR 51 | Temp 97.5°F | Ht 69.0 in | Wt 209.0 lb

## 2024-07-22 DIAGNOSIS — E538 Deficiency of other specified B group vitamins: Secondary | ICD-10-CM

## 2024-07-22 DIAGNOSIS — Z0000 Encounter for general adult medical examination without abnormal findings: Secondary | ICD-10-CM | POA: Diagnosis not present

## 2024-07-22 DIAGNOSIS — Z951 Presence of aortocoronary bypass graft: Secondary | ICD-10-CM

## 2024-07-22 NOTE — Patient Instructions (Signed)
 [name],  Thank you for taking the time for your Medicare Wellness Visit. I appreciate your continued commitment to your health goals. Please review the care plan we discussed, and feel free to reach out if I can assist you further.  Please note that Annual Wellness Visits do not include a physical exam. Some assessments may be limited, especially if the visit was conducted virtually. If needed, we may recommend an in-person follow-up with your provider.  Ongoing Care Seeing your primary care provider every 3 to 6 months helps us  monitor your health and provide consistent, personalized care.   Referrals If a referral was made during today's visit and you haven't received any updates within two weeks, please contact the referred provider directly to check on the status.  Recommended Screenings:  Health Maintenance  Topic Date Due   Medicare Annual Wellness Visit  07/15/2024   COVID-19 Vaccine (3 - 2025-26 season) 04/05/2025*   DTaP/Tdap/Td vaccine (3 - Td or Tdap) 05/16/2030   Colon Cancer Screening  12/14/2030   Pneumococcal Vaccine for age over 80  Completed   Flu Shot  Completed   Hepatitis C Screening  Completed   HIV Screening  Completed   Zoster (Shingles) Vaccine  Completed   Hepatitis B Vaccine  Aged Out   HPV Vaccine  Aged Out   Meningitis B Vaccine  Aged Out  *Topic was postponed. The date shown is not the original due date.       07/18/2024    8:56 AM  Advanced Directives  Does Patient Have a Medical Advance Directive? No  Would patient like information on creating a medical advance directive? No - Patient declined    Vision: Annual vision screenings are recommended for early detection of glaucoma, cataracts, and diabetic retinopathy. These exams can also reveal signs of chronic conditions such as diabetes and high blood pressure.  Dental: Annual dental screenings help detect early signs of oral cancer, gum disease, and other conditions linked to overall health,  including heart disease and diabetes.  Please see the attached documents for additional preventive care recommendations.

## 2024-07-22 NOTE — Progress Notes (Signed)
 Daily Session Note  Patient Details  Name: Jeffrey Hooper MRN: [id number] Date of Birth: 24-Feb-1961 Referring Provider:   Flowsheet Row CARDIAC REHAB PHASE II ORIENTATION from 06/29/2024 in [facility] CARDIAC REHABILITATION  Referring Provider Jordan, Peter MD    Encounter Date: 07/22/2024  Check In:  Session Check In - 07/22/24 1015       Check-In   Supervising physician immediately available to respond to emergencies See telemetry face sheet for immediately available MD    Location [facility]    Staff Present Laymon Rattler, BSN, RN, WTA-C;Hillary Troutman BSN, RN;Jessica [place], MA, RCEP, CCRP, CCET    Virtual Visit No    Medication changes reported     No    Comments Got a B12 shot today at the doctor.    Fall or balance concerns reported    No    Tobacco Cessation No Change    Warm-up and Cool-down Performed on first and last piece of equipment    Resistance Training Performed Yes    VAD Patient? No    PAD/SET Patient? No          Capillary Blood Glucose: No results found for this or any previous visit (from the past 24 hours).    Tobacco Use History[1]  Goals Met:  Independence with exercise equipment Exercise tolerated well No report of concerns or symptoms today Strength training completed today  Goals Unmet:  Not Applicable  Comments: Pt able to follow exercise prescription today without complaint.  Will continue to monitor for progression.        [1]  Social History Tobacco Use  Smoking Status Former   Current packs/day: 0.00   Average packs/day: 1 pack/day for 20.0 years (20.0 ttl pk-yrs)   Types: Cigarettes   Start date: 11/28/1986   Quit date: 11/28/2006   Years since quitting: 17.6  Smokeless Tobacco Never

## 2024-07-22 NOTE — Progress Notes (Signed)
 Chief Complaint  Patient presents with   Medicare Wellness     Subjective:   Jeffrey Hooper is a 63 y.o. male who presents for a Medicare Annual Wellness Visit.  Visit info / Clinical Intake: Medicare Wellness Visit Type:: Subsequent Annual Wellness Visit Persons participating in visit and providing information:: patient Medicare Wellness Visit Mode:: In-person (required for WTM) Interpreter Needed?: No Pre-visit prep was completed: yes AWV questionnaire completed by patient prior to visit?: yes Date:: 07/18/24 Living arrangements:: (Patient-Rptd) lives with spouse/significant other Patient's Overall Health Status Rating: (Patient-Rptd) good Typical amount of pain: (!) (Patient-Rptd) a lot Does pain affect daily life?: (!) (Patient-Rptd) yes Are you currently prescribed opioids?: no  Dietary Habits and Nutritional Risks How many meals a day?: (Patient-Rptd) 2 Eats fruit and vegetables daily?: (Patient-Rptd) yes Most meals are obtained by: (Patient-Rptd) preparing own meals In the last 2 weeks, have you had any of the following?: none Diabetic:: no  Functional Status Activities of Daily Living (to include ambulation/medication): (Patient-Rptd) Independent Ambulation: (Patient-Rptd) Independent Medication Administration: (Patient-Rptd) Independent Home Management (perform basic housework or laundry): (Patient-Rptd) Independent Manage your own finances?: (Patient-Rptd) yes Primary transportation is: (Patient-Rptd) driving Concerns about vision?: no *vision screening is required for WTM* (last 2024/Dr Johnson at [facility] in [place], [place]) Concerns about hearing?: (!) yes Uses hearing aids?: (!) yes Hear whispered voice?: yes  Fall Screening Falls in the past year?: (Patient-Rptd) 0 Number of falls in past year: 0 Was there an injury with Fall?: 0 Fall Risk Category Calculator: 0 Patient Fall Risk Level: Low Fall Risk  Fall Risk Patient at Risk for Falls Due to: No  Fall Risks Fall risk Follow up: Falls evaluation completed; Education provided  Home and Transportation Safety: All rugs have non-skid backing?: (Patient-Rptd) yes All stairs or steps have railings?: (Patient-Rptd) N/A, no stairs Grab bars in the bathtub or shower?: (Patient-Rptd) yes Have non-skid surface in bathtub or shower?: (Patient-Rptd) yes Good home lighting?: (Patient-Rptd) yes Regular seat belt use?: (Patient-Rptd) yes Hospital stays in the last year:: (!) (Patient-Rptd) yes How many hospital stays:: (Patient-Rptd) 3  Cognitive Assessment Difficulty concentrating, remembering, or making decisions? : (Patient-Rptd) no Will 6CIT or Mini Cog be Completed: yes What year is it?: 0 points What month is it?: 0 points Give patient an address phrase to remember (5 components): [address] [place] About what time is it?: 0 points Count backwards from 20 to 1: 0 points Say the months of the year in reverse: 0 points Repeat the address phrase from earlier: 0 points 6 CIT Score: 0 points  Advance Directives (For Healthcare) Does Patient Have a Medical Advance Directive?: No Would patient like information on creating a medical advance directive?: No - Patient declined  Reviewed/Updated  Reviewed/Updated: Reviewed All (Medical, Surgical, Family, Medications, Allergies, Care Teams, Patient Goals); Medical History; Surgical History; Medications; Allergies; Care Teams; Patient Goals    Allergies (verified) Alpha-gal, Bovine (beef) protein, Bovine (beef) protein-containing drug products, Lambs quarters, Porcine (pork) protein-containing drug products, Prednisone , Brilinta  [ticagrelor ], Lyrica [pregabalin], Methylprednisolone  sodium succ, Penicillins, Shellfish allergy , Adhesive [tape], Doxycycline, and Fentanyl    Current Medications (verified) Outpatient Encounter Medications as of 07/22/2024  Medication Sig   acetaminophen  (TYLENOL ) 500 MG tablet Take 1,000 mg by mouth  every 8 (eight) hours as needed for moderate pain (pain score 4-6).   albuterol  (VENTOLIN  HFA) 108 (90 Base) MCG/ACT inhaler TAKE 2 PUFFS BY MOUTH EVERY 6 HOURS AS NEEDED FOR WHEEZE OR SHORTNESS OF BREATH  amiodarone  (PACERONE ) 200 MG tablet Take 1 tablet (200 mg total) by mouth daily.   amLODipine  (NORVASC ) 2.5 MG tablet Take 1 tablet (2.5 mg total) by mouth daily.   aspirin  EC 325 MG tablet Take 1 tablet (325 mg total) by mouth daily.   atorvastatin  (LIPITOR ) 80 MG tablet TAKE 1 TABLET BY MOUTH EVERYDAY AT BEDTIME   busPIRone  (BUSPAR ) 10 MG tablet Take 1 tablet (10 mg total) by mouth 2 (two) times daily.   cetirizine  (ZYRTEC ) 10 MG tablet Take 10 mg by mouth daily.   EPINEPHrine  0.3 mg/0.3 mL IJ SOAJ injection Inject 0.3 mg into the muscle as needed for anaphylaxis.   ezetimibe  (ZETIA ) 10 MG tablet Take 1 tablet (10 mg total) by mouth daily.   fluticasone  (FLONASE ) 50 MCG/ACT nasal spray Place 2 sprays into both nostrils daily as needed for allergies or rhinitis.   hydrocortisone  (ANUSOL -HC) 2.5 % rectal cream Place rectally 2 (two) times daily as needed for hemorrhoids or anal itching.   levothyroxine  (SYNTHROID ) 100 MCG tablet Take 1 tablet (100 mcg total) by mouth daily.   MAGNESIUM  GLYCINATE PO Take 2 tablets by mouth at bedtime.   olmesartan  (BENICAR ) 40 MG tablet Take 1 tablet (40 mg total) by mouth daily.   ondansetron  (ZOFRAN -ODT) 4 MG disintegrating tablet TAKE 1 TABLET BY MOUTH EVERY 8 HOURS AS NEEDED FOR NAUSEA AND VOMITING   pantoprazole  (PROTONIX ) 40 MG tablet Take 40 mg by mouth 2 (two) times daily.   PARoxetine  (PAXIL ) 20 MG tablet Take 1 tablet (20 mg total) by mouth daily.   Facility-Administered Encounter Medications as of 07/22/2024  Medication   cyanocobalamin  (VITAMIN B12) injection 1,000 mcg    History: Past Medical History:  Diagnosis Date   Abnormal nuclear stress test 12/02/2015   Allergy     Allergy  to alpha-gal    Anxiety    Arthritis    CAD (coronary  artery disease), native coronary artery 12/03/2015   Coronary artery disease    Difficult intubation    anterior larynx and limited oral opening, glidescope used electively   Dysrhythmia    PACs and SVT   GERD (gastroesophageal reflux disease)    Head injury with loss of consciousness (HCC)    back in the 1980's   History of hiatal hernia    History of kidney stones    Hyperlipidemia    Hypertension    Hypothyroid    Multiple benign nevi    Neuromuscular disorder (HCC) 2011   LEFT RADIAL NERVE SURGERY R/T TRAUMA   Pneumonia 2014, 2017   Prostatism    Pulmonary nodule    Spinal cord stimulator status    told to bring remote to surgery   Past Surgical History:  Procedure Laterality Date   BACK SURGERY     T12 - L1, 11 times including neck   CARDIAC CATHETERIZATION N/A 12/02/2015   Procedure: Left Heart Cath and Coronary Angiography;  Surgeon: Peter M Jordan, MD;  Location: [facility] INVASIVE CV LAB;  Service: Cardiovascular;  Laterality: N/A;   CARDIAC CATHETERIZATION N/A 12/02/2015   Procedure: Coronary Stent Intervention;  Surgeon: Peter M Jordan, MD;  Location: [facility] INVASIVE CV LAB;  Service: Cardiovascular;  Laterality: N/A;  mid LAD Promus 2.5x12   CERVICAL FUSION  2023   C4 and C5   COLONOSCOPY     CORONARY ANGIOPLASTY     one stent placed by Dr. P Jordan   CORONARY ARTERY BYPASS GRAFT N/A 05/21/2024   Procedure: OFF PUMP CORONARY ARTERY BYPASS  GRAFTING (CABG) TIMES ONE UTILIZING LEFT RADIAL ARTERY;  Surgeon: Shyrl Linnie KIDD, MD;  Location: [facility] OR;  Service: Open Heart Surgery;  Laterality: N/A;   CORONARY ARTERY BYPASS GRAFT  05/21/2024   CORONARY CTO INTERVENTION N/A 03/19/2024   Procedure: CORONARY CTO INTERVENTION;  Surgeon: Jordan, Peter M, MD;  Location: [facility] INVASIVE CV LAB;  Service: Cardiovascular;  Laterality: N/A;   HARDWARE REMOVAL Right 08/26/2017   Procedure: Right Lumbar Five Revision of pedicle screw with Removal of Lumbar Five Screw;  Surgeon: Colon Shove,  MD;  Location: [facility] OR;  Service: Neurosurgery;  Laterality: Right;  posterior   HARDWARE REMOVAL Right 12/27/2017   Procedure: Right Lumbar Two, Lumbar Three, Lumbar Four Pedicle screw removal with metrex;  Surgeon: Colon Shove, MD;  Location: [facility] OR;  Service: Neurosurgery;  Laterality: Right;  Right L2 to L4 Pedicle screw removal with mterex   HEMORRHOID BANDING     HERNIA REPAIR     INTRAOPERATIVE TRANSESOPHAGEAL ECHOCARDIOGRAM N/A 05/21/2024   Procedure: ECHOCARDIOGRAM, TRANSESOPHAGEAL, INTRAOPERATIVE;  Surgeon: Shyrl Linnie KIDD, MD;  Location: [facility] OR;  Service: Open Heart Surgery;  Laterality: N/A;   LAPAROSCOPY N/A 09/01/2020   Procedure: LAPAROSCOPY DIAGNOSTIC;  Surgeon: Debby Hila, MD;  Location: [facility] ORS;  Service: General;  Laterality: N/A;   LEFT HEART CATH AND CORONARY ANGIOGRAPHY N/A 02/26/2024   Procedure: LEFT HEART CATH AND CORONARY ANGIOGRAPHY;  Surgeon: Jordan, Peter M, MD;  Location: [facility] INVASIVE CV LAB;  Service: Cardiovascular;  Laterality: N/A;   MASS EXCISION  06/25/2012   Procedure: EXCISION MASS;  Surgeon: Arley JONELLE Curia, MD;  Location: [facility];  Service: Orthopedics;  Laterality: Left;  transection of NEUROMA, BURYING RADIAL NERVE IN BRACHIORADIALIS LEFT SIDE   RADIAL ARTERY HARVEST Left 05/21/2024   Procedure: SURGICAL PROCUREMENT, ARTERY, RADIAL;  Surgeon: Shyrl Linnie KIDD, MD;  Location: [facility] OR;  Service: Open Heart Surgery;  Laterality: Left;   radial nerve Left    cut in work injury   RIGHT GREAT TOENAIL REMOVAL  04/2020   SPINAL FUSION     C6-7   TONSILLECTOMY  1982   UMBILICAL HERNIA REPAIR N/A 09/01/2020   Procedure: PRIMARY UMBILICAL HERNIA REPAIR;  Surgeon: Debby Hila, MD;  Location: [facility]S;  Service: General;  Laterality: N/A;   VASECTOMY     Family History  Problem Relation Age of Onset   Heart failure Mother 72       Died age 33   Congestive Heart Failure Mother    Heart attack Father 45       Died with MI   Heart  attack Paternal Grandmother    Heart attack Paternal Grandfather    Colon cancer Neg Hx    Esophageal cancer Neg Hx    Pancreatic cancer Neg Hx    Stomach cancer Neg Hx    Social History   Occupational History   Occupation: [employer]  Tobacco Use   Smoking status: Former    Current packs/day: 0.00    Average packs/day: 1 pack/day for 20.0 years (20.0 ttl pk-yrs)    Types: Cigarettes    Start date: 11/28/1986    Quit date: 11/28/2006    Years since quitting: 17.6   Smokeless tobacco: Never  Vaping Use   Vaping status: Never Used  Substance and Sexual Activity   Alcohol use: Not Currently    Comment: rarely   Drug use: Never   Sexual activity: Yes    Birth control/protection: Surgical  Tobacco Counseling Counseling given: Yes  SDOH Screenings   Food Insecurity: No Food Insecurity (06/06/2024)  Housing: Low Risk (07/22/2024)  Transportation Needs: No Transportation Needs (07/22/2024)  Utilities: Not At Risk (07/22/2024)  Alcohol Screen: Low Risk (08/30/2023)  Depression (PHQ2-9): Low Risk (07/22/2024)  Financial Resource Strain: Low Risk (06/06/2024)  Physical Activity: Insufficiently Active (07/22/2024)  Social Connections: Socially Integrated (07/22/2024)  Stress: No Stress Concern Present (07/22/2024)  Tobacco Use: Medium Risk (07/22/2024)  Health Literacy: Adequate Health Literacy (07/22/2024)   See flowsheets for full screening details  Depression Screen PHQ 2 & 9 Depression Scale- Over the past 2 weeks, how often have you been bothered by any of the following problems? Little interest or pleasure in doing things: 0 Feeling down, depressed, or hopeless (PHQ Adolescent also includes...irritable): 0 PHQ-2 Total Score: 0 Trouble falling or staying asleep, or sleeping too much: 1 Feeling tired or having little energy: 1 Poor appetite or overeating (PHQ Adolescent also includes...weight loss): 0 Feeling bad about yourself - or that you are a  failure or have let yourself or your family down: 0 Trouble concentrating on things, such as reading the newspaper or watching television (PHQ Adolescent also includes...like school work): 1 Moving or speaking so slowly that other people could have noticed. Or the opposite - being so fidgety or restless that you have been moving around a lot more than usual: 0 Thoughts that you would be better off dead, or of hurting yourself in some way: 0 PHQ-9 Total Score: 3 If you checked off any problems, how difficult have these problems made it for you to do your work, take care of things at home, or get along with other people?: Not difficult at all  Depression Treatment Depression Interventions/Treatment : EYV7-0 Score <4 Follow-up Not Indicated     Goals Addressed             This Visit's Progress    Prevent falls   On track            Objective:    Today's Vitals   07/22/24 0846  BP: 137/72  Pulse: (!) 51  Temp: (!) 97.5 F (36.4 C)  TempSrc: Oral  Weight: 209 lb (94.8 kg)  Height: 5' 9 (1.753 m)   Body mass index is 30.86 kg/m.  Hearing/Vision screen No results found. Immunizations and Health Maintenance Health Maintenance  Topic Date Due   COVID-19 Vaccine (3 - 2025-26 season) 04/05/2025 (Originally 04/06/2024)   Medicare Annual Wellness (AWV)  07/22/2025   DTaP/Tdap/Td (3 - Td or Tdap) 05/16/2030   Colonoscopy  12/14/2030   Pneumococcal Vaccine: 50+ Years  Completed   Influenza Vaccine  Completed   Hepatitis C Screening  Completed   HIV Screening  Completed   Zoster Vaccines- Shingrix  Completed   Hepatitis B Vaccines 19-59 Average Risk  Aged Out   HPV VACCINES  Aged Out   Meningococcal B Vaccine  Aged Out        Assessment/Plan:  This is a routine wellness examination for Deward.  Patient Care Team: [name], DO as PCP - General (Family Medicine) Lynwood Schilling, MD as PCP - Cardiology (Cardiology) Teressa Toribio SQUIBB, MD (Inactive) as Attending  Physician (Gastroenterology) Colon Shove, MD as Consulting Physician (Neurosurgery) Vicci Mcardle, [place] (Optometry) Jordan, Peter M, MD as Consulting Physician (Cardiology)  I have personally reviewed and noted the following in the patients chart:   Medical and social history Use of alcohol, tobacco or illicit drugs  Current  medications and supplements including opioid prescriptions. Functional ability and status Nutritional status Physical activity Advanced directives List of other physicians Hospitalizations, surgeries, and ER visits in previous 12 months Vitals Screenings to include cognitive, depression, and falls Referrals and appointments  No orders of the defined types were placed in this encounter.  In addition, I have reviewed and discussed with patient certain preventive protocols, quality metrics, and best practice recommendations. A written personalized care plan for preventive services as well as general preventive health recommendations were provided to patient.   Ozie Ned, CMA   07/22/2024   Return in 1 year (on 07/22/2025).  After Visit Summary: (MyChart) Due to this being a telephonic visit, the after visit summary with patients personalized plan was offered to patient via MyChart   Nurse Notes: n/a

## 2024-07-22 NOTE — Progress Notes (Signed)
 Patient is in office today for a nurse visit for B12 Injection. Patient Injection was given in the  Left deltoid. Patient tolerated injection well.

## 2024-07-24 ENCOUNTER — Encounter (HOSPITAL_COMMUNITY)

## 2024-07-24 DIAGNOSIS — Z951 Presence of aortocoronary bypass graft: Secondary | ICD-10-CM

## 2024-07-24 NOTE — Progress Notes (Signed)
 Daily Session Note  Patient Details  Name: Jeffrey Hooper MRN: [id number] Date of Birth: Oct 14, 1960 Referring Provider:   Flowsheet Row CARDIAC REHAB PHASE II ORIENTATION from 06/29/2024 in [facility] CARDIAC REHABILITATION  Referring Provider Jordan, Peter MD    Encounter Date: 07/24/2024  Check In:  Session Check In - 07/24/24 0917       Check-In   Supervising physician immediately available to respond to emergencies See telemetry face sheet for immediately available MD    Location [facility]    Staff Present Harlene Gelineau, MA, RCEP, CCRP, CCET;Doshia Dalia Matheson, RN    Virtual Visit No    Medication changes reported     No    Fall or balance concerns reported    No    Warm-up and Cool-down Performed on first and last piece of equipment    Resistance Training Performed Yes    VAD Patient? No    PAD/SET Patient? No      Pain Assessment   Currently in Pain? No/denies          Capillary Blood Glucose: No results found for this or any previous visit (from the past 24 hours).    Tobacco Use History[1]  Goals Met:  Independence with exercise equipment Exercise tolerated well No report of concerns or symptoms today Strength training completed today  Goals Unmet:  Not Applicable  Comments: Pt able to follow exercise prescription today without complaint.  Will continue to monitor for progression.        [1]  Social History Tobacco Use  Smoking Status Former   Current packs/day: 0.00   Average packs/day: 1 pack/day for 20.0 years (20.0 ttl pk-yrs)   Types: Cigarettes   Start date: 11/28/1986   Quit date: 11/28/2006   Years since quitting: 17.6  Smokeless Tobacco Never

## 2024-07-27 ENCOUNTER — Encounter (HOSPITAL_COMMUNITY): Admission: RE | Admit: 2024-07-27 | Discharge: 2024-07-27 | Attending: Cardiology

## 2024-07-27 DIAGNOSIS — Z951 Presence of aortocoronary bypass graft: Secondary | ICD-10-CM | POA: Diagnosis not present

## 2024-07-27 NOTE — Progress Notes (Signed)
 Daily Session Note  Patient Details  Name: Jeffrey Hooper MRN: [id number] Date of Birth: 01-12-1961 Referring Provider:   Flowsheet Row CARDIAC REHAB PHASE II ORIENTATION from 06/29/2024 in [facility] CARDIAC REHABILITATION  Referring Provider Jordan, Peter MD    Encounter Date: 07/27/2024  Check In:  Session Check In - 07/27/24 0920       Check-In   Supervising physician immediately available to respond to emergencies See telemetry face sheet for immediately available MD    Location [facility]    Staff Present Powell Benders, BS, Exercise Physiologist;Brittany Jackquline, BSN, RN, Rosalba Gelineau, MA, RCEP, CCRP, CCET    Virtual Visit No    Medication changes reported     No    Fall or balance concerns reported    No    Tobacco Cessation No Change    Warm-up and Cool-down Performed on first and last piece of equipment    Resistance Training Performed Yes    VAD Patient? No    PAD/SET Patient? No      Pain Assessment   Currently in Pain? No/denies    Pain Score 0-No pain    Multiple Pain Sites No          Capillary Blood Glucose: No results found for this or any previous visit (from the past 24 hours).    Tobacco Use History[1]  Goals Met:  Independence with exercise equipment Exercise tolerated well No report of concerns or symptoms today Strength training completed today  Goals Unmet:  Not Applicable  Comments: Pt able to follow exercise prescription today without complaint.  Will continue to monitor for progression.        [1]  Social History Tobacco Use  Smoking Status Former   Current packs/day: 0.00   Average packs/day: 1 pack/day for 20.0 years (20.0 ttl pk-yrs)   Types: Cigarettes   Start date: 11/28/1986   Quit date: 11/28/2006   Years since quitting: 17.6  Smokeless Tobacco Never

## 2024-07-29 ENCOUNTER — Encounter (HOSPITAL_COMMUNITY)

## 2024-07-29 ENCOUNTER — Other Ambulatory Visit: Payer: Self-pay | Admitting: Internal Medicine

## 2024-08-03 ENCOUNTER — Encounter (HOSPITAL_COMMUNITY)
Admission: RE | Admit: 2024-08-03 | Discharge: 2024-08-03 | Disposition: A | Source: Ambulatory Visit | Attending: Cardiology

## 2024-08-03 DIAGNOSIS — Z951 Presence of aortocoronary bypass graft: Secondary | ICD-10-CM | POA: Diagnosis not present

## 2024-08-03 NOTE — Progress Notes (Signed)
 Daily Session Note  Patient Details  Name: CID AGENA MRN: [id number] Date of Birth: 05-Feb-1961 Referring Provider:   Flowsheet Row CARDIAC REHAB PHASE II ORIENTATION from 06/29/2024 in [facility] CARDIAC REH[facility]IL[facility]ATION  Referring Provider Jordan, Peter MD    Encounter Date: 08/03/2024  Check In:  Session Check In - 08/03/24 0902       Check-In   Supervising physician immediately available to respond to emergencies See telemetry face sheet for immediately available MD    Location [facility]    Staff Present Harlene Gelineau, MA, RCEP, CCRP, CCET;Yavier Snider Jackquline, BSN, RN, WTA-C    Virtual Visit No    Medication changes reported     No    Fall or balance concerns reported    No    Tobacco Cessation No Change    Warm-up and Cool-down Performed on first and last piece of equipment    Resistance Training Performed Yes    VAD Patient? No    PAD/SET Patient? No      Pain Assessment   Currently in Pain? No/denies          Capillary Blood Glucose: No results found for this or any previous visit (from the past 24 hours).    Tobacco Use History[1]  Goals Met:  Independence with exercise equipment Exercise tolerated well No report of concerns or symptoms today Strength training completed today  Goals Unmet:  Not Applicable  Comments: Pt able to follow exercise prescription today without complaint.  Will continue to monitor for progression.        [1]  Social History Tobacco Use  Smoking Status Former   Current packs/day: 0.00   Average packs/day: 1 pack/day for 20.0 years (20.0 ttl pk-yrs)   Types: Cigarettes   Start date: 11/28/1986   Quit date: 11/28/2006   Years since quitting: 17.6  Smokeless Tobacco Never

## 2024-08-04 ENCOUNTER — Encounter (HOSPITAL_COMMUNITY): Payer: Self-pay | Admitting: *Deleted

## 2024-08-04 DIAGNOSIS — Z951 Presence of aortocoronary bypass graft: Secondary | ICD-10-CM

## 2024-08-04 NOTE — Progress Notes (Signed)
 Cardiac Individual Treatment Plan  Patient Details  Name: Jeffrey Hooper MRN: [id number] Date of Birth: 18-Oct-1960 Referring Provider:   Flowsheet Row CARDIAC REHAB PHASE II ORIENTATION from 06/29/2024 in [facility]  Referring Provider Jordan, Peter MD    Initial Encounter Date:  Flowsheet Row CARDIAC REHAB PHASE II ORIENTATION from 06/29/2024 in [place] [facility]  Date 06/29/24    Visit Diagnosis: S/P CABG x 1  Patient's Home Medications on Admission: Current Medications[1]  Past Medical History: Past Medical History:  Diagnosis Date   Abnormal nuclear stress test 12/02/2015   Allergy     Allergy  to alpha-gal    Anxiety    Arthritis    CAD (coronary artery disease), native coronary artery 12/03/2015   Coronary artery disease    Difficult intubation    anterior larynx and limited oral opening, glidescope used electively   Dysrhythmia    PACs and SVT   GERD (gastroesophageal reflux disease)    Head injury with loss of consciousness (HCC)    back in the 1980's   History of hiatal hernia    History of kidney stones    Hyperlipidemia    Hypertension    Hypothyroid    Multiple benign nevi    Neuromuscular disorder (HCC) 2011   LEFT RADIAL NERVE SURGERY R/T TRAUMA   Pneumonia 2014, 2017   Prostatism    Pulmonary nodule    Spinal cord stimulator status    told to bring remote to surgery    Tobacco Use: Tobacco Use History[2]  Labs: Review Flowsheet  More data exists      Latest Ref Rng & Units 08/30/2023 12/23/2023 05/19/2024 05/21/2024 06/10/2024  Labs for ITP Cardiac and Pulmonary Rehab  Cholestrol 100 - 199 mg/dL 854  857  - - 880   LDL (calc) 0 - 99 mg/dL 73  69  - - 50   HDL-C >39 mg/dL 45  46  - - 52   Trlycerides 0 - 149 mg/dL 843  866  - - 89   Hemoglobin A1c 4.8 - 5.6 % 5.7  - 5.8  - -  PH, Arterial 7.35 - 7.45 - - - 7.352  7.367  7.460  7.439  7.418  7.355  -  PCO2 arterial 32 - 48 mmHg - - - 37.8  36.4  32.0   32.1  33.4  41.9  -  Bicarbonate 20.0 - 28.0 mmol/L - - - 21.0  21.0  23.4  21.7  21.5  23.4  -  TCO2 22 - 32 mmol/L - - - 22  22  25  23  22  23  22  25  25   -  Acid-base deficit 0.0 - 2.0 mmol/L - - - 4.0  4.0  1.0  2.0  2.0  2.0  -  O2 Saturation % - - - 97  98  97  100  100  100  -    Details       Multiple values from one day are sorted in reverse-chronological order         Capillary Blood Glucose: Lab Results  Component Value Date   GLUCAP 93 05/26/2024   GLUCAP 130 (H) 05/25/2024   GLUCAP 98 05/25/2024   GLUCAP 142 (H) 05/25/2024   GLUCAP 99 05/25/2024     Exercise Target Goals: Exercise Program Goal: Individual exercise prescription set using results from initial 6 min walk test and THRR while considering  patients activity barriers and safety.  Exercise Prescription Goal: Starting with aerobic activity 30 plus minutes a day, 3 days per week for initial exercise prescription. Provide home exercise prescription and guidelines that participant acknowledges understanding prior to discharge.  Activity Barriers & Risk Stratification:  Activity Barriers & Cardiac Risk Stratification - 06/29/24 0758       Activity Barriers & Cardiac Risk Stratification   Activity Barriers Neck/Spine Problems;Back Problems;Assistive Device;Shortness of Breath;Arthritis   Patient reports several fused disc in cervical and lumbar areas with chronic pain. He has pins in hips due to degenerative OA. He uses a straight cane for ambulation.   Cardiac Risk Stratification High          6 Minute Walk:  6 Minute Walk     Row Name 06/29/24 0932         6 Minute Walk   Phase Initial     Distance 1000 feet     Walk Time 6 minutes     # of Rest Breaks 0     MPH 1.9     METS 2.4     RPE 13     Perceived Dyspnea  1     VO2 Peak 8.4     Symptoms No     Resting HR 56 bpm     Resting BP 130/66     Resting Oxygen Saturation  97 %     Exercise Oxygen Saturation  during 6 min walk 97 %      Max Ex. HR 76 bpm     Max Ex. BP 140/60     2 Minute Post BP 132/60        Oxygen Initial Assessment:   Oxygen Re-Evaluation:   Oxygen Discharge (Final Oxygen Re-Evaluation):   Initial Exercise Prescription:  Initial Exercise Prescription - 06/29/24 0900       Date of Initial Exercise RX and Referring Provider   Date 06/29/24    Referring Provider Jordan, Peter MD      NuStep   Level 2    SPM 50    Minutes 15    METs 1.9      Arm Ergometer   Level 1    RPM 50    Minutes 15    METs 1.9      Prescription Details   Frequency (times per week) 3    Duration Progress to 30 minutes of continuous aerobic without signs/symptoms of physical distress      Intensity   THRR 40-80% of Max Heartrate 96-137    Ratings of Perceived Exertion 11-13    Perceived Dyspnea 0-4      Resistance Training   Training Prescription Yes    Weight 4    Reps 10-15          Perform Capillary Blood Glucose checks as needed.  Exercise Prescription Changes:   Exercise Prescription Changes     Row Name 06/29/24 0900 07/21/24 0800           Response to Exercise   Blood Pressure (Admit) 130/66 124/60      Blood Pressure (Exercise) 140/60 148/68      Blood Pressure (Exit) 132/60 106/64      Heart Rate (Admit) 56 bpm 64 bpm      Heart Rate (Exercise) 76 bpm 83 bpm      Heart Rate (Exit) 62 bpm 77 bpm      Oxygen Saturation (Admit) 97 % --      Oxygen Saturation (Exercise) 97 % --  Oxygen Saturation (Exit) 97 % --      Rating of Perceived Exertion (Exercise) 13 12      Perceived Dyspnea (Exercise) 1 --      Duration -- Continue with 30 min of aerobic exercise without signs/symptoms of physical distress.      Intensity -- THRR unchanged        Progression   Progression -- Continue to progress workloads to maintain intensity without signs/symptoms of physical distress.        Resistance Training   Weight -- 4      Reps -- 10-15        Arm Ergometer   Level -- 2       RPM -- 54      Minutes -- 15      METs -- 2.5         Exercise Comments:   Exercise Comments     Row Name 07/01/24 0909           Exercise Comments First full day of exercise!  Patient was oriented to gym and equipment including functions, settings, policies, and procedures.  Patient's individual exercise prescription and treatment plan were reviewed.  All starting workloads were established based on the results of the 6 minute walk test done at initial orientation visit.  The plan for exercise progression was also introduced and progression will be customized based on patient's performance and goals.          Exercise Goals and Review:   Exercise Goals     Row Name 06/29/24 [phone]             Exercise Goals   Increase Physical Activity Yes       Intervention Provide advice, education, support and counseling about physical activity/exercise needs.;Develop an individualized exercise prescription for aerobic and resistive training based on initial evaluation findings, risk stratification, comorbidities and participant's personal goals.       Expected Outcomes Short Term: Attend rehab on a regular basis to increase amount of physical activity.;Long Term: Add in home exercise to make exercise part of routine and to increase amount of physical activity.;Long Term: Exercising regularly at least 3-5 days a week.       Increase Strength and Stamina Yes       Intervention Provide advice, education, support and counseling about physical activity/exercise needs.;Develop an individualized exercise prescription for aerobic and resistive training based on initial evaluation findings, risk stratification, comorbidities and participant's personal goals.       Expected Outcomes Short Term: Increase workloads from initial exercise prescription for resistance, speed, and METs.;Short Term: Perform resistance training exercises routinely during rehab and add in resistance training at home;Long Term:  Improve cardiorespiratory fitness, muscular endurance and strength as measured by increased METs and functional capacity ( )       Able to understand and use rate of perceived exertion (RPE) scale Yes       Intervention Provide education and explanation on how to use RPE scale       Expected Outcomes Short Term: Able to use RPE daily in rehab to express subjective intensity level;Long Term:  Able to use RPE to guide intensity level when exercising independently       Able to understand and use Dyspnea scale Yes       Intervention Provide education and explanation on how to use Dyspnea scale       Expected Outcomes Short Term: Able to use Dyspnea scale daily in rehab to  express subjective sense of shortness of breath during exertion;Long Term: Able to use Dyspnea scale to guide intensity level when exercising independently       Knowledge and understanding of Target Heart Rate Range (THRR) Yes       Intervention Provide education and explanation of THRR including how the numbers were predicted and where they are located for reference       Expected Outcomes Short Term: Able to state/look up THRR;Long Term: Able to use THRR to govern intensity when exercising independently;Short Term: Able to use daily as guideline for intensity in rehab       Able to check pulse independently Yes       Intervention Provide education and demonstration on how to check pulse in carotid and radial arteries.;Review the importance of being able to check your own pulse for safety during independent exercise       Expected Outcomes Short Term: Able to explain why pulse checking is important during independent exercise;Long Term: Able to check pulse independently and accurately       Understanding of Exercise Prescription Yes       Intervention Provide education, explanation, and written materials on patient's individual exercise prescription       Expected Outcomes Short Term: Able to explain program exercise  prescription;Long Term: Able to explain home exercise prescription to exercise independently          Exercise Goals Re-Evaluation :  Exercise Goals Re-Evaluation     Row Name 07/01/24 0909 07/13/24 1227           Exercise Goal Re-Evaluation   Exercise Goals Review Increase Physical Activity;Increase Strength and Stamina;Able to understand and use Dyspnea scale;Able to check pulse independently;Knowledge and understanding of Target Heart Rate Range (THRR);Able to understand and use rate of perceived exertion (RPE) scale;Understanding of Exercise Prescription Increase Physical Activity;Increase Strength and Stamina;Understanding of Exercise Prescription      Comments Reviewed RPE and dyspnea scale, THR and program prescription with pt today.  Pt voiced understanding and was given a copy of goals to take home. Jeffrey Hooper is doing well in rehab! His attendance is really well. He doesn't do much exercise at home due to his hip pain, but he is coming here 3 days a week. He states he is going to try walking on the treadmill the days he isn't here when his hip feels better.      Expected Outcomes Short: Use RPE daily to regulate intensity.  Long: Follow program prescription in THR. Short: Continue to attend rehab. Long: Incorporate exercise at home.          Discharge Exercise Prescription (Final Exercise Prescription Changes):  Exercise Prescription Changes - 07/21/24 0800       Response to Exercise   Blood Pressure (Admit) 124/60    Blood Pressure (Exercise) 148/68    Blood Pressure (Exit) 106/64    Heart Rate (Admit) 64 bpm    Heart Rate (Exercise) 83 bpm    Heart Rate (Exit) 77 bpm    Rating of Perceived Exertion (Exercise) 12    Duration Continue with 30 min of aerobic exercise without signs/symptoms of physical distress.    Intensity THRR unchanged      Progression   Progression Continue to progress workloads to maintain intensity without signs/symptoms of physical distress.       Resistance Training   Weight 4    Reps 10-15      Arm Ergometer   Level 2  RPM 54    Minutes 15    METs 2.5          Nutrition:  Target Goals: Understanding of nutrition guidelines, daily intake of sodium 1500mg , cholesterol 200mg , calories 30% from fat and 7% or less from saturated fats, daily to have 5 or more servings of fruits and vegetables.  Biometrics:  Pre Biometrics - 06/29/24 0935       Pre Biometrics   Height 5' 9 (1.753 m)    Weight 208 lb 15.9 oz (94.8 kg)    Waist Circumference 46 inches    Hip Circumference 42 inches    Waist to Hip Ratio 1.1 %    BMI (Calculated) 30.85    Grip Strength 31.8 kg    Single Leg Stand 0 seconds           Nutrition Therapy Plan and Nutrition Goals:   Nutrition Assessments:  MEDIFICTS Score Key: >=70 Need to make dietary changes  40-70 Heart Healthy Diet <= 40 Therapeutic Level Cholesterol Diet  Flowsheet Row CARDIAC REHAB PHASE II ORIENTATION from 06/29/2024 in [facility]  Picture Your Plate Total Score on Admission 46   Picture Your Plate Scores: <59 Unhealthy dietary pattern with much room for improvement. 41-50 Dietary pattern unlikely to meet recommendations for good health and room for improvement. 51-60 More healthful dietary pattern, with some room for improvement.  >60 Healthy dietary pattern, although there may be some specific behaviors that could be improved.    Nutrition Goals Re-Evaluation:  Nutrition Goals Re-Evaluation     Row Name 07/13/24 1224             Goals   Nutrition Goal Healthy eating.       Comment Jeffrey Hooper is doing well with his diet. He states he tries to eat healthy and drinks enough water . He does some of his favorite things in moderation.       Expected Outcome Short: Continue adequate water  intake. Long: Continue healthy eating and sweets in moderation.          Nutrition Goals Discharge (Final Nutrition Goals Re-Evaluation):  Nutrition Goals  Re-Evaluation - 07/13/24 1224       Goals   Nutrition Goal Healthy eating.    Comment Jeffrey Hooper is doing well with his diet. He states he tries to eat healthy and drinks enough water . He does some of his favorite things in moderation.    Expected Outcome Short: Continue adequate water  intake. Long: Continue healthy eating and sweets in moderation.          Psychosocial: Target Goals: Acknowledge presence or absence of significant depression and/or stress, maximize coping skills, provide positive support system. Participant is able to verbalize types and ability to use techniques and skills needed for reducing stress and depression.  Initial Review & Psychosocial Screening:  Initial Psych Review & Screening - 06/29/24 0836       Initial Review   Current issues with None Identified      Family Dynamics   Good Support System? Yes      Barriers   Psychosocial barriers to participate in program The patient should benefit from training in stress management and relaxation.;There are no identifiable barriers or psychosocial needs.      Screening Interventions   Interventions Program counselor consult;To provide support and resources with identified psychosocial needs;Provide feedback about the scores to participant    Expected Outcomes Short Term goal: Utilizing psychosocial counselor, staff and physician to assist with identification  of specific Stressors or current issues interfering with healing process. Setting desired goal for each stressor or current issue identified.;Long Term Goal: Stressors or current issues are controlled or eliminated.;Short Term goal: Identification and review with participant of any Quality of Life or Depression concerns found by scoring the questionnaire.;Long Term goal: The participant improves quality of Life and PHQ9 Scores as seen by post scores and/or verbalization of changes          Quality of Life Scores:  Quality of Life - 06/29/24 0937        Quality of Life   Select Quality of Life      Quality of Life Scores   Health/Function Pre 26.8 %    Socioeconomic Pre 30 %    Psych/Spiritual Pre 30 %    Family Pre 28.8 %    GLOBAL Pre 28.36 %         Scores of 19 and below usually indicate a poorer quality of life in these areas.  A difference of  2-3 points is a clinically meaningful difference.  A difference of 2-3 points in the total score of the Quality of Life Index has been associated with significant improvement in overall quality of life, self-image, physical symptoms, and general health in studies assessing change in quality of life.  PHQ-9: Review Flowsheet  More data exists      07/22/2024 06/29/2024 05/28/2024 03/09/2024 01/07/2024  Depression screen PHQ 2/9  Decreased Interest 0 0 0 0 0  Down, Depressed, Hopeless 0 0 0 0 0  PHQ - 2 Score 0 0 0 0 0  Altered sleeping - 1 - 1 0  Tired, decreased energy - 1 - 2 0  Change in appetite - 0 - 0 0  Feeling bad or failure about yourself  - 0 - 0 0  Trouble concentrating - 1 - 1 0  Moving slowly or fidgety/restless - 0 - 1 0  Suicidal thoughts - 0 - 0 0  PHQ-9 Score - 3 - 5  0   Difficult doing work/chores - Not difficult at all - Not difficult at all Not difficult at all    Details       Data saved with a previous flowsheet row definition        Interpretation of Total Score  Total Score Depression Severity:  1-4 = Minimal depression, 5-9 = Mild depression, 10-14 = Moderate depression, 15-19 = Moderately severe depression, 20-27 = Severe depression   Psychosocial Evaluation and Intervention:  Psychosocial Evaluation - 06/29/24 0838       Psychosocial Evaluation & Interventions   Interventions Stress management education;Relaxation education;Encouraged to exercise with the program and follow exercise prescription    Comments Patient was referred to cardiac rehab with CABGx1. He completed the program in 2017 with a stent. He denies any depression, anxiety or  stressors. He reports sleeping okay. He usually sleeps in a recliner due to his chronic bilateral hip and back pain. He lives with is wife who is his main support along with his 2 adult sons. He has an adopted son age 4. 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 He is very active in his church and does volunteer work in a advertising copywriter. His goals for the program to make sure his heart is doing okay, his heart rate is good and he wants to be as active as he can.  His chronic pain could be a barrier for him but he hopes  to be able to exercise without increasing his pain.    Expected Outcomes Short Term: Patient will start the program and attend consistently. Long Term: Patient will complete the program meeting personal goals.    Continue Psychosocial Services  Follow up required by staff          Psychosocial Re-Evaluation:  Psychosocial Re-Evaluation     Row Name 07/13/24 1222             Psychosocial Re-Evaluation   Current issues with None Identified       Comments Jeffrey Hooper is doing well in rehab! He always has such a positive attitude and he doesn't identify any stressors currently in his life. He is sleeping ok as well. He is very active in the community with his local [employer], [employer] and with a local food bank.       Expected Outcomes Short: Continue to attend rehab. Long: Continue to stay active in the community.       Interventions Encouraged to attend Cardiac Rehabilitation for the exercise       Continue Psychosocial Services  Follow up required by staff          Psychosocial Discharge (Final Psychosocial Re-Evaluation):  Psychosocial Re-Evaluation - 07/13/24 1222       Psychosocial Re-Evaluation   Current issues with None Identified    Comments Jeffrey Hooper is doing well in rehab! He always has such a positive attitude and he doesn't identify any stressors currently in his life. He is sleeping ok as well. He is very active in the community with his  local [employer], [employer] and with a local food bank.    Expected Outcomes Short: Continue to attend rehab. Long: Continue to stay active in the community.    Interventions Encouraged to attend Cardiac Rehabilitation for the exercise    Continue Psychosocial Services  Follow up required by staff          Vocational Rehabilitation: Provide vocational rehab assistance to qualifying candidates.   Vocational Rehab Evaluation & Intervention:  Vocational Rehab - 06/29/24 [phone]       Initial Vocational Rehab Evaluation & Intervention   Assessment shows need for Vocational Rehabilitation No      Vocational Rehab Re-Evaulation   Comments Patient is retired.          Education: Education Goals: Education classes will be provided on a weekly basis, covering required topics. Participant will state understanding/return demonstration of topics presented.  Learning Barriers/Preferences:  Learning Barriers/Preferences - 06/29/24 0834       Learning Barriers/Preferences   Learning Barriers None    Learning Preferences Skilled Demonstration          Education Topics: Hypertension, Hypertension Reduction -Define heart disease and high blood pressure. Discus how high blood pressure affects the body and ways to reduce high blood pressure.   Exercise and Your Heart -Discuss why it is important to exercise, the FITT principles of exercise, normal and abnormal responses to exercise, and how to exercise safely. Flowsheet Row CARDIAC REHAB PHASE II EXERCISE from 04/04/2016 in [place] [facility]  Date 03/14/16  Educator DC  Instruction Review Code (retired) 2- meets goals/outcomes    Angina -Discuss definition of angina, causes of angina, treatment of angina, and how to decrease risk of having angina. Flowsheet Row CARDIAC REHAB PHASE II EXERCISE from 04/04/2016 in [place] [facility]  Date 03/21/16  Educator Loa Sheets  Instruction Review  Code 2- meets goals/outcomes  Cardiac Medications -Review what the following cardiac medications are used for, how they affect the body, and side effects that may occur when taking the medications.  Medications include Aspirin , Beta blockers, calcium  channel blockers, ACE Inhibitors, angiotensin receptor blockers, diuretics, digoxin, and antihyperlipidemics. Flowsheet Row CARDIAC REHAB PHASE II EXERCISE from 04/04/2016 in [place] [facility]  Date 03/28/16  Educator Loa Sheets  Instruction Review Code 2- meets goals/outcomes    Congestive Heart Failure -Discuss the definition of CHF, how to live with CHF, the signs and symptoms of CHF, and how keep track of weight and sodium intake. Flowsheet Row CARDIAC REHAB PHASE II EXERCISE from 04/04/2016 in [place] [facility]  Date 04/04/16  Educator DC  Instruction Review Code 2- meets goals/outcomes    Heart Disease and Intimacy -Discus the effect sexual activity has on the heart, how changes occur during intimacy as we age, and safety during sexual activity. Flowsheet Row CARDIAC REHAB PHASE II EXERCISE from 04/04/2016 in [place] [facility]  Date 01/11/16  Educator DC  Instruction Review Code (retired) 2- meets goals/outcomes    Smoking Cessation / COPD -Discuss different methods to quit smoking, the health benefits of quitting smoking, and the definition of COPD. Flowsheet Row CARDIAC REHAB PHASE II EXERCISE from 04/04/2016 in [place] [facility]  Date 01/18/16  Educator Loa Sheets  Instruction Review Code (retired) 2- meets goals/outcomes    Nutrition I: Fats -Discuss the types of cholesterol, what cholesterol does to the heart, and how cholesterol levels can be controlled. Flowsheet Row CARDIAC REHAB PHASE II EXERCISE from 04/04/2016 in [place] [facility]  Date 01/25/16  Educator Loa Sheets  Instruction Review Code 2- meets goals/outcomes    Nutrition II:  Labels -Discuss the different components of food labels and how to read food label Flowsheet Row CARDIAC REHAB PHASE II EXERCISE from 04/04/2016 in [place] [facility]  Date 02/01/16  Educator Loa Sheets  Instruction Review Code 2- meets goals/outcomes    Heart Parts/Heart Disease and PAD -Discuss the anatomy of the heart, the pathway of blood circulation through the heart, and these are affected by heart disease. Flowsheet Row CARDIAC REHAB PHASE II EXERCISE from 04/04/2016 in [place] [facility]  Date 02/08/16  Educator DC  Instruction Review Code 2- meets goals/outcomes    Stress I: Signs and Symptoms -Discuss the causes of stress, how stress may lead to anxiety and depression, and ways to limit stress.   Stress II: Relaxation -Discuss different types of relaxation techniques to limit stress.   Warning Signs of Stroke / TIA -Discuss definition of a stroke, what the signs and symptoms are of a stroke, and how to identify when someone is having stroke. Flowsheet Row CARDIAC REHAB PHASE II EXERCISE from 04/04/2016 in [facility]  Date 02/29/16  Educator DJ/DC  Instruction Review Code 2- meets goals/outcomes    Knowledge Questionnaire Score:  Knowledge Questionnaire Score - 06/29/24 0911       Knowledge Questionnaire Score   Pre Score 18/26          Core Components/Risk Factors/Patient Goals at Admission:  Personal Goals and Risk Factors at Admission - 06/29/24 0835       Core Components/Risk Factors/Patient Goals on Admission    Weight Management Weight Maintenance    Improve shortness of breath with ADL's Yes    Intervention Provide education, individualized exercise plan and daily activity instruction to help decrease symptoms of SOB with activities of daily living.  Expected Outcomes Short Term: Improve cardiorespiratory fitness to achieve a reduction of symptoms when performing ADLs;Long Term: Be able to perform  more ADLs without symptoms or delay the onset of symptoms    Hypertension Yes    Intervention Provide education on lifestyle modifcations including regular physical activity/exercise, weight management, moderate sodium restriction and increased consumption of fresh fruit, vegetables, and low fat dairy, alcohol moderation, and smoking cessation.;Monitor prescription use compliance.    Expected Outcomes Short Term: Continued assessment and intervention until BP is < 140/64mm HG in hypertensive participants. < 130/46mm HG in hypertensive participants with diabetes, heart failure or chronic kidney disease.;Long Term: Maintenance of blood pressure at goal levels.    Lipids Yes    Intervention Provide education and support for participant on nutrition & aerobic/resistive exercise along with prescribed medications to achieve LDL 70mg , HDL >40mg .    Expected Outcomes Short Term: Participant states understanding of desired cholesterol values and is compliant with medications prescribed. Participant is following exercise prescription and nutrition guidelines.;Long Term: Cholesterol controlled with medications as prescribed, with individualized exercise RX and with personalized nutrition plan. Value goals: LDL < 70mg , HDL > 40 mg.          Core Components/Risk Factors/Patient Goals Review:   Goals and Risk Factor Review     Row Name 07/13/24 1226             Core Components/Risk Factors/Patient Goals Review   Personal Goals Review Hypertension;Lipids       Review Dequavion is doing well in rehab. He takes all his medicines as prescribed and checks his BP everyday. His wife is a engineer, civil (consulting) so she helps keep him on track.       Expected Outcomes Short: Continue to attend rehab. Long: Continue checking vitals and report abnormalities.          Core Components/Risk Factors/Patient Goals at Discharge (Final Review):   Goals and Risk Factor Review - 07/13/24 1226       Core Components/Risk Factors/Patient  Goals Review   Personal Goals Review Hypertension;Lipids    Review Jeffrey Hooper is doing well in rehab. He takes all his medicines as prescribed and checks his BP everyday. His wife is a engineer, civil (consulting) so she helps keep him on track.    Expected Outcomes Short: Continue to attend rehab. Long: Continue checking vitals and report abnormalities.          ITP Comments:  ITP Comments     Row Name 06/29/24 1146 07/01/24 0909 07/07/24 0919 08/04/24 1228     ITP Comments Patient arrived for 1st visit/orientation/education at 0800. Patient was referred to CR by Dr. James Hochrien/[name] attending MD due to Status Post CABGx1. During orientation advised patient on arrival and appointment times what to wear, what to do before, during and after exercise. Reviewed attendance and class policy.  Pt is scheduled to return Cardiac Rehab on 07/01/24 at 915. Pt was advised to come to class 15 minutes before class starts.  Discussed RPE/Dpysnea scales. Patient participated in warm up stretches. Patient was able to complete 6 minute walk test.  Telemetry:NSR. Patient was measured for the equipment. Discussed equipment safety with patient. Took patient pre-anthropometric measurements. Patient finished visit at 930. First full day of exercise!  Patient was oriented to gym and equipment including functions, settings, policies, and procedures.  Patient's individual exercise prescription and treatment plan were reviewed.  All starting workloads were established based on the results of the 6 minute walk test done at initial  orientation visit.  The plan for exercise progression was also introduced and progression will be customized based on patient's performance and goals. 30 day review completed. ITP sent to Dr. Dorn Ross, Medical Director of Cardiac Rehab. Continue with ITP unless changes are made by physician.  New to program 30 day review completed. ITP sent to Dr. Dorn Ross, Medical Director of Cardiac Rehab. Continue with  ITP unless changes are made by physician.       Comments: 30 day review     [1]  Current Outpatient Medications:    acetaminophen  (TYLENOL ) 500 MG tablet, Take 1,000 mg by mouth every 8 (eight) hours as needed for moderate pain (pain score 4-6)., Disp: , Rfl:    albuterol  (VENTOLIN  HFA) 108 (90 Base) MCG/ACT inhaler, TAKE 2 PUFFS BY MOUTH EVERY 6 HOURS AS NEEDED FOR WHEEZE OR SHORTNESS OF BREATH, Disp: 18 each, Rfl: 1   amiodarone  (PACERONE ) 200 MG tablet, Take 1 tablet (200 mg total) by mouth daily., Disp: 90 tablet, Rfl: 3   amLODipine  (NORVASC ) 2.5 MG tablet, Take 1 tablet (2.5 mg total) by mouth daily., Disp: 30 tablet, Rfl: 1   aspirin  EC 325 MG tablet, Take 1 tablet (325 mg total) by mouth daily., Disp: , Rfl:    atorvastatin  (LIPITOR ) 80 MG tablet, TAKE 1 TABLET BY MOUTH EVERYDAY AT BEDTIME, Disp: 90 tablet, Rfl: 3   busPIRone  (BUSPAR ) 10 MG tablet, TAKE 1 TABLET BY MOUTH TWICE A DAY, Disp: 180 tablet, Rfl: 1   cetirizine  (ZYRTEC ) 10 MG tablet, Take 10 mg by mouth daily., Disp: , Rfl:    EPINEPHrine  0.3 mg/0.3 mL IJ SOAJ injection, Inject 0.3 mg into the muscle as needed for anaphylaxis., Disp: 2 each, Rfl: 0   ezetimibe  (ZETIA ) 10 MG tablet, Take 1 tablet (10 mg total) by mouth daily., Disp: 90 tablet, Rfl: 3   fluticasone  (FLONASE ) 50 MCG/ACT nasal spray, Place 2 sprays into both nostrils daily as needed for allergies or rhinitis., Disp: , Rfl:    hydrocortisone  (ANUSOL -HC) 2.5 % rectal cream, Place rectally 2 (two) times daily as needed for hemorrhoids or anal itching., Disp: 30 g, Rfl: 0   levothyroxine  (SYNTHROID ) 100 MCG tablet, Take 1 tablet (100 mcg total) by mouth daily., Disp: 90 tablet, Rfl: 3   MAGNESIUM  GLYCINATE PO, Take 2 tablets by mouth at bedtime., Disp: , Rfl:    olmesartan  (BENICAR ) 40 MG tablet, Take 1 tablet (40 mg total) by mouth daily., Disp: 90 tablet, Rfl: 3   ondansetron  (ZOFRAN -ODT) 4 MG disintegrating tablet, TAKE 1 TABLET BY MOUTH EVERY 8 HOURS AS NEEDED  FOR NAUSEA AND VOMITING, Disp: 20 tablet, Rfl: 0   pantoprazole  (PROTONIX ) 40 MG tablet, Take 40 mg by mouth 2 (two) times daily., Disp: , Rfl:    PARoxetine  (PAXIL ) 20 MG tablet, Take 1 tablet (20 mg total) by mouth daily., Disp: 90 tablet, Rfl: 3  Current Facility-Administered Medications:    cyanocobalamin  (VITAMIN B12) injection 1,000 mcg, 1,000 mcg, Intramuscular, Q30 days, Jolinda Potter M, DO, 1,000 mcg at 07/22/24 9171 [2]  Social History Tobacco Use  Smoking Status Former   Current packs/day: 0.00   Average packs/day: 1 pack/day for 20.0 years (20.0 ttl pk-yrs)   Types: Cigarettes   Start date: 11/28/1986   Quit date: 11/28/2006   Years since quitting: 17.6  Smokeless Tobacco Never

## 2024-08-05 ENCOUNTER — Encounter (HOSPITAL_COMMUNITY)
Admission: RE | Admit: 2024-08-05 | Discharge: 2024-08-05 | Disposition: A | Discharge status: Home / Self Care | Source: Ambulatory Visit | Attending: Cardiology | Admitting: Cardiology

## 2024-08-05 DIAGNOSIS — Z951 Presence of aortocoronary bypass graft: Secondary | ICD-10-CM | POA: Diagnosis not present

## 2024-08-05 NOTE — Progress Notes (Signed)
 Daily Session Note  Patient Details  Name: Jeffrey Hooper MRN: [id number] Date of Birth: Jan 12, 1961 Referring Provider:   Flowsheet Row CARDIAC REHAB PHASE II ORIENTATION from 06/29/2024 in [facility]  Referring Provider Jordan, Peter MD    Encounter Date: 08/05/2024  Check In:  Session Check In - 08/05/24 9077       Check-In   Supervising physician immediately available to respond to emergencies See telemetry face sheet for immediately available MD    Location [facility]    Staff Present Powell Benders, BS, Exercise Physiologist;Brittany Jackquline, BSN, RN, Rosalba Gelineau, MA, RCEP, CCRP, CCET    Virtual Visit No    Medication changes reported     No    Fall or balance concerns reported    No    Tobacco Cessation No Change    Warm-up and Cool-down Performed on first and last piece of equipment    Resistance Training Performed Yes    VAD Patient? No    PAD/SET Patient? No      Pain Assessment   Currently in Pain? No/denies    Pain Score 0-No pain    Multiple Pain Sites No          Capillary Blood Glucose: No results found for this or any previous visit (from the past 24 hours).    Tobacco Use History[1]  Goals Met:  Independence with exercise equipment Exercise tolerated well No report of concerns or symptoms today Strength training completed today  Goals Unmet:  Not Applicable  Comments: Pt able to follow exercise prescription today without complaint.  Will continue to monitor for progression.        [1]  Social History Tobacco Use  Smoking Status Former   Current packs/day: 0.00   Average packs/day: 1 pack/day for 20.0 years (20.0 ttl pk-yrs)   Types: Cigarettes   Start date: 11/28/1986   Quit date: 11/28/2006   Years since quitting: 17.6  Smokeless Tobacco Never

## 2024-08-07 ENCOUNTER — Ambulatory Visit: Payer: Self-pay | Admitting: Cardiology

## 2024-08-07 ENCOUNTER — Encounter (HOSPITAL_COMMUNITY)
Admission: RE | Admit: 2024-08-07 | Discharge: 2024-08-07 | Disposition: A | Discharge status: Home / Self Care | Source: Ambulatory Visit | Attending: Cardiology | Admitting: Cardiology

## 2024-08-07 DIAGNOSIS — I480 Paroxysmal atrial fibrillation: Secondary | ICD-10-CM

## 2024-08-07 DIAGNOSIS — Z951 Presence of aortocoronary bypass graft: Secondary | ICD-10-CM | POA: Insufficient documentation

## 2024-08-07 NOTE — Progress Notes (Signed)
 Daily Session Note  Patient Details  Name: Jeffrey Hooper MRN: [id number] Date of Birth: [birth year] Referring Provider:   Flowsheet Row CARDIAC REHAB PHASE II ORIENTATION from 06/29/2024 in [facility]  Referring Provider Jordan, Peter MD    Encounter Date: 08/07/2024  Check In:  Session Check In - 08/07/24 0920       Check-In   Supervising physician immediately available to respond to emergencies See telemetry face sheet for immediately available MD    Location [facility]    Staff Present Powell Benders, BS, Exercise Physiologist;Mallarie Voorhies Jackquline, BSN, RN, WTA-C    Virtual Visit No    Medication changes reported     No    Fall or balance concerns reported    No    Tobacco Cessation No Change    Warm-up and Cool-down Performed on first and last piece of equipment    Resistance Training Performed Yes    VAD Patient? No    PAD/SET Patient? No      Pain Assessment   Currently in Pain? No/denies          Capillary Blood Glucose: No results found for this or any previous visit (from the past 24 hours).    Tobacco Use History[1]  Goals Met:  Independence with exercise equipment Exercise tolerated well No report of concerns or symptoms today Strength training completed today  Goals Unmet:  Not Applicable  Comments: Pt able to follow exercise prescription today without complaint.  Will continue to monitor for progression.        [1]  Social History Tobacco Use  Smoking Status Former   Current packs/day: 0.00   Average packs/day: 1 pack/day for 20.0 years (20.0 ttl pk-yrs)   Types: Cigarettes   Start date: 11/28/1986   Quit date: 11/28/2006   Years since quitting: 17.7  Smokeless Tobacco Never

## 2024-08-10 ENCOUNTER — Encounter (HOSPITAL_COMMUNITY)
Admission: RE | Admit: 2024-08-10 | Discharge: 2024-08-10 | Disposition: A | Discharge status: Home / Self Care | Source: Ambulatory Visit | Attending: Cardiology | Admitting: Cardiology

## 2024-08-10 DIAGNOSIS — Z951 Presence of aortocoronary bypass graft: Secondary | ICD-10-CM

## 2024-08-10 NOTE — Progress Notes (Signed)
 Daily Session Note  Patient Details  Name: Jeffrey Hooper MRN: [id number] Date of Birth: 04/17/1961 Referring Provider:   Flowsheet Row CARDIAC REHAB PHASE II ORIENTATION from 06/29/2024 in [facility]  Referring Provider Jordan, Peter MD    Encounter Date: 08/10/2024  Check In:  Session Check In - 08/10/24 0855       Check-In   Supervising physician immediately available to respond to emergencies See telemetry face sheet for immediately available MD    Location [facility]    Staff Present Powell Benders, BS, Exercise Physiologist;Ifeoluwa Beller Jackquline, BSN, RN, Rosalba Gelineau, MA, RCEP, CCRP, CCET    Virtual Visit No    Medication changes reported     No    Tobacco Cessation No Change    Warm-up and Cool-down Performed on first and last piece of equipment    Resistance Training Performed Yes    VAD Patient? No    PAD/SET Patient? No      Pain Assessment   Currently in Pain? No/denies          Capillary Blood Glucose: No results found for this or any previous visit (from the past 24 hours).    Tobacco Use History[1]  Goals Met:  Independence with exercise equipment Exercise tolerated well No report of concerns or symptoms today Strength training completed today  Goals Unmet:  Not Applicable  Comments: Pt able to follow exercise prescription today without complaint.  Will continue to monitor for progression.        [1]  Social History Tobacco Use  Smoking Status Former   Current packs/day: 0.00   Average packs/day: 1 pack/day for 20.0 years (20.0 ttl pk-yrs)   Types: Cigarettes   Start date: 11/28/1986   Quit date: 11/28/2006   Years since quitting: 17.7  Smokeless Tobacco Never

## 2024-08-12 ENCOUNTER — Encounter (HOSPITAL_COMMUNITY)
Admission: RE | Admit: 2024-08-12 | Discharge: 2024-08-12 | Disposition: A | Discharge status: Home / Self Care | Source: Ambulatory Visit | Attending: Cardiology | Admitting: Cardiology

## 2024-08-12 DIAGNOSIS — Z951 Presence of aortocoronary bypass graft: Secondary | ICD-10-CM

## 2024-08-12 NOTE — Progress Notes (Signed)
 Daily Session Note  Patient Details  Name: Jeffrey Hooper MRN: [id number] Date of Birth: 05/20/1961 Referring Provider:   Flowsheet Row CARDIAC REHAB PHASE II ORIENTATION from 06/29/2024 in [facility]  Referring Provider Jordan, Peter MD    Encounter Date: 08/12/2024  Check In:  Session Check In - 08/12/24 0901       Check-In   Supervising physician immediately available to respond to emergencies See telemetry face sheet for immediately available MD    [facility]    Staff Present Powell Benders, BS, Exercise Physiologist;Weslie Pretlow Jackquline, BSN, RN, Rosalba Gelineau, MA, RCEP, CCRP, CCET    Virtual Visit No    Medication changes reported     No    Fall or balance concerns reported    No    Tobacco Cessation No Change    Warm-up and Cool-down Performed on first and last piece of equipment    Resistance Training Performed Yes    VAD Patient? No    PAD/SET Patient? No      Pain Assessment   Currently in Pain? No/denies          Capillary Blood Glucose: No results found for this or any previous visit (from the past 24 hours).    Tobacco Use History[1]  Goals Met:  Independence with exercise equipment Exercise tolerated well No report of concerns or symptoms today Strength training completed today  Goals Unmet:  Not Applicable  Comments: Pt able to follow exercise prescription today without complaint.  Will continue to monitor for progression.        [1]  Social History Tobacco Use  Smoking Status Former   Current packs/day: 0.00   Average packs/day: 1 pack/day for 20.0 years (20.0 ttl pk-yrs)   Types: Cigarettes   Start date: 11/28/1986   Quit date: 11/28/2006   Years since quitting: 17.7  Smokeless Tobacco Never

## 2024-08-14 ENCOUNTER — Encounter (HOSPITAL_COMMUNITY)
Admission: RE | Admit: 2024-08-14 | Discharge: 2024-08-14 | Disposition: A | Source: Ambulatory Visit | Attending: Cardiology

## 2024-08-14 DIAGNOSIS — Z951 Presence of aortocoronary bypass graft: Secondary | ICD-10-CM

## 2024-08-14 NOTE — Progress Notes (Signed)
 Daily Session Note  Patient Details  Name: Jeffrey Hooper MRN: [id number] Date of Birth: 1960-12-22 Referring Provider:   Flowsheet Row CARDIAC REHAB PHASE II ORIENTATION from 06/29/2024 in [facility]  Referring Provider Jordan, Peter MD    Encounter Date: 08/14/2024  Check In:  Session Check In - 08/14/24 9077       Check-In   Supervising physician immediately available to respond to emergencies See telemetry face sheet for immediately available MD    [facility]    Staff Present Powell Benders, BS, Exercise Physiologist;Jessica Vonzell, MA, RCEP, CCRP, CCET    Virtual Visit No    Medication changes reported     No    Fall or balance concerns reported    No    Tobacco Cessation No Change    Warm-up and Cool-down Performed on first and last piece of equipment    Resistance Training Performed Yes    VAD Patient? No    PAD/SET Patient? No      Pain Assessment   Currently in Pain? No/denies    Pain Score 0-No pain    Multiple Pain Sites No          Capillary Blood Glucose: No results found for this or any previous visit (from the past 24 hours).    Tobacco Use History[1]  Goals Met:  Independence with exercise equipment Exercise tolerated well No report of concerns or symptoms today Strength training completed today  Goals Unmet:  Not Applicable  Comments: Pt able to follow exercise prescription today without complaint.  Will continue to monitor for progression.        [1]  Social History Tobacco Use  Smoking Status Former   Current packs/day: 0.00   Average packs/day: 1 pack/day for 20.0 years (20.0 ttl pk-yrs)   Types: Cigarettes   Start date: 11/28/1986   Quit date: 11/28/2006   Years since quitting: 17.7  Smokeless Tobacco Never

## 2024-08-17 ENCOUNTER — Encounter (HOSPITAL_COMMUNITY)
Admission: RE | Admit: 2024-08-17 | Discharge: 2024-08-17 | Disposition: A | Source: Ambulatory Visit | Attending: Cardiology

## 2024-08-17 DIAGNOSIS — Z951 Presence of aortocoronary bypass graft: Secondary | ICD-10-CM

## 2024-08-17 NOTE — Progress Notes (Signed)
 Daily Session Note  Patient Details  Name: Jeffrey Hooper MRN: [id number] Date of Birth: 10-Nov-1960 Referring Provider:   Flowsheet Row CARDIAC REHAB PHASE II ORIENTATION from 06/29/2024 in [facility]  Referring Provider Jordan, Peter MD    Encounter Date: 08/17/2024  Check In:  Session Check In - 08/17/24 0927       Check-In   Supervising physician immediately available to respond to emergencies See telemetry face sheet for immediately available MD    Location [facility]    Staff Present Laymon Rattler, BSN, RN, Rosalba Gelineau, MA, RCEP, CCRP, CCET;Rosalin Buster [place], [place], Exercise Physiologist    Virtual Visit No    Medication changes reported     No    Fall or balance concerns reported    No    Tobacco Cessation No Change    Warm-up and Cool-down Performed on first and last piece of equipment    Resistance Training Performed Yes    VAD Patient? No    PAD/SET Patient? No      Pain Assessment   Currently in Pain? No/denies    Pain Score 0-No pain    Multiple Pain Sites No          Capillary Blood Glucose: No results found for this or any previous visit (from the past 24 hours).    Tobacco Use History[1]  Goals Met:  Independence with exercise equipment Exercise tolerated well No report of concerns or symptoms today  Goals Unmet:  Not Applicable  Comments: Pt able to follow exercise prescription today without complaint.  Will continue to monitor for progression.        [1]  Social History Tobacco Use  Smoking Status Former   Current packs/day: 0.00   Average packs/day: 1 pack/day for 20.0 years (20.0 ttl pk-yrs)   Types: Cigarettes   Start date: 11/28/1986   Quit date: 11/28/2006   Years since quitting: 17.7  Smokeless Tobacco Never

## 2024-08-19 ENCOUNTER — Encounter (HOSPITAL_COMMUNITY)
Admission: RE | Admit: 2024-08-19 | Discharge: 2024-08-19 | Disposition: A | Discharge status: Home / Self Care | Source: Ambulatory Visit | Attending: Cardiology | Admitting: Cardiology

## 2024-08-19 DIAGNOSIS — Z951 Presence of aortocoronary bypass graft: Secondary | ICD-10-CM

## 2024-08-19 NOTE — Progress Notes (Signed)
 Daily Session Note  Patient Details  Name: Jeffrey Hooper MRN: [id number] Date of Birth: Sep 04, 1960 Referring Provider:   Flowsheet Row CARDIAC REHAB PHASE II ORIENTATION from 06/29/2024 in [facility] RE[facility]  Referring Provider Jordan, Peter MD    Encounter Date: 08/19/2024  Check In:  Session Check In - 08/19/24 0855       Check-In   Supervising physician immediately available to respond to emergencies See telemetry face sheet for immediately available MD    Location [facility]    Staff Present Laymon Rattler, BSN, RN, WTA-C;Heather Con, BS, Exercise Physiologist    Virtual Visit No    Medication changes reported     No    Fall or balance concerns reported    No    Tobacco Cessation No Change    Warm-up and Cool-down Performed on first and last piece of equipment    Resistance Training Performed Yes    VAD Patient? No    PAD/SET Patient? No      Pain Assessment   Currently in Pain? No/denies          Capillary Blood Glucose: No results found for this or any previous visit (from the past 24 hours).    Tobacco Use History[1]  Goals Met:  Independence with exercise equipment Exercise tolerated well No report of concerns or symptoms today Strength training completed today  Goals Unmet:  Not Applicable  Comments: Pt able to follow exercise prescription today without complaint.  Will continue to monitor for progression.        [1]  Social History Tobacco Use  Smoking Status Former   Current packs/day: 0.00   Average packs/day: 1 pack/day for 20.0 years (20.0 ttl pk-yrs)   Types: Cigarettes   Start date: 11/28/1986   Quit date: 11/28/2006   Years since quitting: 17.7  Smokeless Tobacco Never

## 2024-08-21 ENCOUNTER — Ambulatory Visit (INDEPENDENT_AMBULATORY_CARE_PROVIDER_SITE_OTHER): Admitting: *Deleted

## 2024-08-21 ENCOUNTER — Encounter (HOSPITAL_COMMUNITY)
Admission: RE | Admit: 2024-08-21 | Discharge: 2024-08-21 | Disposition: A | Discharge status: Home / Self Care | Source: Ambulatory Visit | Attending: Cardiology | Admitting: Cardiology

## 2024-08-21 DIAGNOSIS — E538 Deficiency of other specified B group vitamins: Secondary | ICD-10-CM | POA: Diagnosis not present

## 2024-08-21 DIAGNOSIS — Z951 Presence of aortocoronary bypass graft: Secondary | ICD-10-CM

## 2024-08-21 NOTE — Progress Notes (Signed)
 Daily Session Note  Patient Details  Name: KAMAREE WHEATLEY MRN: [id number] Date of Birth: 1960-09-24 Referring Provider:   Flowsheet Row CARDIAC REHAB PHASE II ORIENTATION from 06/29/2024 in [facility]  Referring Provider Jordan, Peter MD    Encounter Date: 08/21/2024  Check In:  Session Check In - 08/21/24 0900       Check-In   Supervising physician immediately available to respond to emergencies See telemetry face sheet for immediately available MD    Location [facility]    Staff Present Laymon Rattler, BSN, RN, Rosalba Gelineau, MA, RCEP, CCRP, CCET;[place], RN    Virtual Visit No    Medication changes reported     No    Fall or balance concerns reported    No    Tobacco Cessation No Change    Warm-up and Cool-down Performed on first and last piece of equipment    Resistance Training Performed Yes    VAD Patient? No    PAD/SET Patient? No      Pain Assessment   Currently in Pain? No/denies          Capillary Blood Glucose: No results found for this or any previous visit (from the past 24 hours).    Tobacco Use History[1]  Goals Met:  Independence with exercise equipment Exercise tolerated well No report of concerns or symptoms today Strength training completed today  Goals Unmet:  Not Applicable  Comments: Pt able to follow exercise prescription today without complaint.  Will continue to monitor for progression.        [1]  Social History Tobacco Use  Smoking Status Former   Current packs/day: 0.00   Average packs/day: 1 pack/day for 20.0 years (20.0 ttl pk-yrs)   Types: Cigarettes   Start date: 11/28/1986   Quit date: 11/28/2006   Years since quitting: 17.7  Smokeless Tobacco Never

## 2024-08-21 NOTE — Progress Notes (Signed)
 Patient is in office today for a nurse visit for B12 Injection. Patient Injection was given in the  Right deltoid. Patient tolerated injection well.

## 2024-08-24 ENCOUNTER — Encounter (HOSPITAL_COMMUNITY)
Admission: RE | Admit: 2024-08-24 | Discharge: 2024-08-24 | Disposition: A | Source: Ambulatory Visit | Attending: Cardiology

## 2024-08-24 DIAGNOSIS — Z951 Presence of aortocoronary bypass graft: Secondary | ICD-10-CM

## 2024-08-24 NOTE — Progress Notes (Signed)
 Daily Session Note  Patient Details  Name: Jeffrey Hooper MRN: [id number] Date of Birth: 10/27/1960 Referring Provider:   Flowsheet Row CARDIAC REHAB PHASE II ORIENTATION from 06/29/2024 in [facility]  Referring Provider Jordan, Peter MD    Encounter Date: 08/24/2024  Check In:  Session Check In - 08/24/24 0859       Check-In   Supervising physician immediately available to respond to emergencies See telemetry face sheet for immediately available MD    Location [facility]    Staff Present Powell Benders, BS, Exercise Physiologist;Jadene Stemmer Jackquline, BSN, RN, WTA-C    Virtual Visit No    Medication changes reported     No    Fall or balance concerns reported    No    Tobacco Cessation No Change    Warm-up and Cool-down Performed on first and last piece of equipment    Resistance Training Performed Yes    VAD Patient? No    PAD/SET Patient? No      Pain Assessment   Currently in Pain? No/denies          Capillary Blood Glucose: No results found for this or any previous visit (from the past 24 hours).    Tobacco Use History[1]  Goals Met:  Independence with exercise equipment Exercise tolerated well No report of concerns or symptoms today Strength training completed today  Goals Unmet:  Not Applicable  Comments: Pt able to follow exercise prescription today without complaint.  Will continue to monitor for progression.        [1]  Social History Tobacco Use  Smoking Status Former   Current packs/day: 0.00   Average packs/day: 1 pack/day for 20.0 years (20.0 ttl pk-yrs)   Types: Cigarettes   Start date: 11/28/1986   Quit date: 11/28/2006   Years since quitting: 17.7  Smokeless Tobacco Never

## 2024-08-26 ENCOUNTER — Encounter (HOSPITAL_COMMUNITY)
Admission: RE | Admit: 2024-08-26 | Discharge: 2024-08-26 | Disposition: A | Source: Ambulatory Visit | Attending: Cardiology

## 2024-08-26 DIAGNOSIS — Z951 Presence of aortocoronary bypass graft: Secondary | ICD-10-CM

## 2024-08-26 NOTE — Progress Notes (Signed)
 Daily Session Note  Patient Details  Name: Jeffrey Hooper MRN: [id number] Date of Birth: 09/15/1960 Referring Provider:   Flowsheet Row CARDIAC REHAB PHASE II ORIENTATION from 06/29/2024 in [facility] CARDIAC REHABILITATION  Referring Provider Jordan, Peter MD    Encounter Date: 08/26/2024  Check In:  Session Check In - 08/26/24 9061       Check-In   Supervising physician immediately available to respond to emergencies See telemetry face sheet for immediately available MD    Location [facility]    Staff Present Powell Benders, BS, Exercise Physiologist;Hillary Dean BSN, RN    Virtual Visit No    Medication changes reported     No    Fall or balance concerns reported    No    Tobacco Cessation No Change    Warm-up and Cool-down Performed on first and last piece of equipment    Resistance Training Performed Yes    VAD Patient? No    PAD/SET Patient? No      Pain Assessment   Currently in Pain? No/denies    Pain Score 0-No pain    Multiple Pain Sites No          Capillary Blood Glucose: No results found for this or any previous visit (from the past 24 hours).    Tobacco Use History[1]  Goals Met:  Independence with exercise equipment Exercise tolerated well No report of concerns or symptoms today Strength training completed today  Goals Unmet:  Not Applicable  Comments: Pt able to follow exercise prescription today without complaint.  Will continue to monitor for progression.        [1]  Social History Tobacco Use  Smoking Status Former   Current packs/day: 0.00   Average packs/day: 1 pack/day for 20.0 years (20.0 ttl pk-yrs)   Types: Cigarettes   Start date: 11/28/1986   Quit date: 11/28/2006   Years since quitting: 17.7  Smokeless Tobacco Never

## 2024-08-27 ENCOUNTER — Encounter: Payer: Self-pay | Admitting: Internal Medicine

## 2024-08-27 ENCOUNTER — Ambulatory Visit: Admitting: Internal Medicine

## 2024-08-27 VITALS — BP 120/66 | HR 51 | Ht 69.0 in | Wt 213.6 lb

## 2024-08-27 DIAGNOSIS — R6881 Early satiety: Secondary | ICD-10-CM | POA: Diagnosis not present

## 2024-08-27 DIAGNOSIS — L290 Pruritus ani: Secondary | ICD-10-CM

## 2024-08-27 DIAGNOSIS — R1013 Epigastric pain: Secondary | ICD-10-CM | POA: Diagnosis not present

## 2024-08-27 DIAGNOSIS — K648 Other hemorrhoids: Secondary | ICD-10-CM | POA: Diagnosis not present

## 2024-08-27 DIAGNOSIS — G8929 Other chronic pain: Secondary | ICD-10-CM

## 2024-08-27 NOTE — Patient Instructions (Addendum)
" °  VISIT SUMMARY: During your visit, we discussed your ongoing upper abdominal pressure and fullness, as well as your hemorrhoidal symptoms. We also reviewed your cardiac status following your coronary artery bypass grafting.  YOUR PLAN: CHRONIC GASTRIC DYSFUNCTION WITH UPPER ABDOMINAL PRESSURE: You have been experiencing chronic upper abdominal pressure and early satiety for several years. Previous treatments have not been effective, and further evaluation is pending cardiology clearance. -We will defer the upper endoscopy until you receive cardiology clearance. -Please contact our office to schedule the endoscopy once you have cardiology approval, without needing an additional clinic visit.  HEMORRHOIDS WITH INTERMITTENT BLEEDING AND PRURITUS: You have chronic hemorrhoids with intermittent symptoms, including itching and minor bleeding, which are partially controlled with topical therapy. -Continue using Anusol  HC hydrocortisone  cream as needed for relief.  I appreciate the opportunity to care for you. Lupita Commander, MD, [facility]     "

## 2024-08-27 NOTE — Progress Notes (Signed)
 [address]       Jeffrey Hooper 64 y.o. 1961-08-05 [id number]  Assessment & Plan:   Encounter Diagnoses  Name Primary?   Abdominal pain, chronic, epigastric Yes   Early satiety    Dyspepsia    Bleeding internal hemorrhoids    Pruritus ani     Chronic upper abdominal pressure and early satiety without alarming features. Prior pharmacologic therapy ineffective, and caused side effects (buspirone  caused nightmares). Endoscopic evaluation deferred pending cardiology clearance.  I think endoscopic evaluation 1 more time is reasonable to look for possible H. pylori or other upper GI tract mucosal abnormalities that could explain his symptoms. - Deferred upper endoscopy until cardiology clearance is obtained. - Instructed him to contact the office to schedule endoscopy if cardiology approves, without requiring an additional clinic visit. - Continue prescription drug management with pantoprazole  40 mg twice daily which helps some  Hemorrhoids with intermittent bleeding and pruritus Chronic hemorrhoids with intermittent symptoms, partially controlled with topical therapy. - Advised continued use of prescription strength Anusol  HC hydrocortisone  cream as needed. - If this stops providing adequate relief consider the following options: Stronger topical steroids for short-term use Tacrolimus ointment (0.1%),  Capsaicin cream (0.006%)     CC: [name], DO      Subjective:   Chief Complaint: Upper abdominal pain, hemorrhoids pruritus ani  HPI Discussed the use of AI scribe software for clinical note transcription with the patient, who gave verbal consent to proceed.  History of Present Illness   Jeffrey Hooper is a 64 year old male with coronary artery disease status post coronary artery bypass grafting who presents for follow-up of chronic upper abdominal pressure and fullness.  Upper Abdominal Pressure and Early Satiety: - Persistent upper abdominal pressure and early satiety for  several years - Sensation of fullness occurs soon after beginning to eat - No associated weight loss - Buspirone  trialed for one month without improvement; discontinued due to onset of nightmares and lack of efficacy - Upper GI series unremarkable - Symptoms are tolerable and he is awaiting further cardiac evaluation prior to additional gastrointestinal workup  Anal Pruritus and Hemorrhoidal Symptoms: - Ongoing pruritus and swelling in the anal region but improved - Associated with hemorrhoids and occasional minor bleeding - Intermittent use of Anusol  HC hydrocortisone  cream provides partial relief; symptoms persist but remain manageable  Cardiac Status: - Status post coronary artery bypass grafting on May 21, 2024 - Ongoing cardiac rehabilitation three days per week - Shortness of breath with walking has improved since surgery - Chest pain thought to be musculoskeletal in nature  -Has follow-up with Dr. Jordan in March, I reviewed the December 2025 visit with Dr. Jordan.      Wt Readings from Last 3 Encounters:  08/27/24 213 lb 9.6 oz (96.9 kg)  07/22/24 209 lb (94.8 kg)  07/09/24 211 lb 12.8 oz (96.1 kg)     Allergies[1] Active Medications[2] Past Medical History:  Diagnosis Date   Abnormal nuclear stress test 12/02/2015   Allergy     Allergy  to alpha-gal    Anxiety    Arthritis    CAD (coronary artery disease), native coronary artery 12/03/2015   Coronary artery disease    Difficult intubation    anterior larynx and limited oral opening, glidescope used electively   Dysrhythmia    PACs and SVT   GERD (gastroesophageal reflux disease)    Head injury with loss of consciousness (HCC)    back in the 1980's   History  of hiatal hernia    History of kidney stones    Hyperlipidemia    Hypertension    Hypothyroid    Multiple benign nevi    Neuromuscular disorder (HCC) 2011   LEFT RADIAL NERVE SURGERY R/T TRAUMA   Pneumonia 2014, 2017   Prostatism    Pulmonary  nodule    Spinal cord stimulator status    told to bring remote to surgery   Past Surgical History:  Procedure Laterality Date   BACK SURGERY     T12 - L1, 11 times including neck   CARDIAC CATHETERIZATION N/A 12/02/2015   Procedure: Left Heart Cath and Coronary Angiography;  Surgeon: Peter M Jordan, MD;  Location: [facility] INVASIVE CV LAB;  Service: Cardiovascular;  Laterality: N/A;   CARDIAC CATHETERIZATION N/A 12/02/2015   Procedure: Coronary Stent Intervention;  Surgeon: Peter M Jordan, MD;  Location: [facility] INVASIVE CV LAB;  Service: Cardiovascular;  Laterality: N/A;  mid LAD Promus 2.5x12   CERVICAL FUSION  2023   C4 and C5   COLONOSCOPY     CORONARY ANGIOPLASTY     one stent placed by Dr. P Jordan   CORONARY ARTERY BYPASS GRAFT N/A 05/21/2024   Procedure: OFF PUMP CORONARY ARTERY BYPASS GRAFTING (CABG) TIMES ONE UTILIZING LEFT RADIAL ARTERY;  Surgeon: Shyrl Linnie KIDD, MD;  Location: [facility] OR;  Service: Open Heart Surgery;  Laterality: N/A;   CORONARY ARTERY BYPASS GRAFT  05/21/2024   CORONARY CTO INTERVENTION N/A 03/19/2024   Procedure: CORONARY CTO INTERVENTION;  Surgeon: Jordan, Peter M, MD;  Location: [facility] INVASIVE CV LAB;  Service: Cardiovascular;  Laterality: N/A;   HARDWARE REMOVAL Right 08/26/2017   Procedure: Right Lumbar Five Revision of pedicle screw with Removal of Lumbar Five Screw;  Surgeon: Colon Shove, MD;  Location: [facility] OR;  Service: Neurosurgery;  Laterality: Right;  posterior   HARDWARE REMOVAL Right 12/27/2017   Procedure: Right Lumbar Two, Lumbar Three, Lumbar Four Pedicle screw removal with metrex;  Surgeon: Colon Shove, MD;  Location: [facility] OR;  Service: Neurosurgery;  Laterality: Right;  Right L2 to L4 Pedicle screw removal with mterex   HEMORRHOID BANDING     HERNIA REPAIR     INTRAOPERATIVE TRANSESOPHAGEAL ECHOCARDIOGRAM N/A 05/21/2024   Procedure: ECHOCARDIOGRAM, TRANSESOPHAGEAL, INTRAOPERATIVE;  Surgeon: Shyrl Linnie KIDD, MD;  Location: [facility] OR;  Service: Open  Heart Surgery;  Laterality: N/A;   LAPAROSCOPY N/A 09/01/2020   Procedure: LAPAROSCOPY DIAGNOSTIC;  Surgeon: Debby Hila, MD;  Location: [facility] ORS;  Service: General;  Laterality: N/A;   LEFT HEART CATH AND CORONARY ANGIOGRAPHY N/A 02/26/2024   Procedure: LEFT HEART CATH AND CORONARY ANGIOGRAPHY;  Surgeon: Jordan, Peter M, MD;  Location: [facility] INVASIVE CV LAB;  Service: Cardiovascular;  Laterality: N/A;   MASS EXCISION  06/25/2012   Procedure: EXCISION MASS;  Surgeon: Arley JONELLE Curia, MD;  Location: [facility];  Service: Orthopedics;  Laterality: Left;  transection of NEUROMA, BURYING RADIAL NERVE IN BRACHIORADIALIS LEFT SIDE   RADIAL ARTERY HARVEST Left 05/21/2024   Procedure: SURGICAL PROCUREMENT, ARTERY, RADIAL;  Surgeon: Shyrl Linnie KIDD, MD;  Location: [facility];  Service: Open Heart Surgery;  Laterality: Left;   radial nerve Left    cut in work injury   RIGHT GREAT TOENAIL REMOVAL  04/2020   SPINAL FUSION     C6-7   TONSILLECTOMY  1982   UMBILICAL HERNIA REPAIR N/A 09/01/2020   Procedure: PRIMARY UMBILICAL HERNIA REPAIR;  Surgeon: Debby Hila, MD;  Location: [facility] OR[facility];  Service: General;  Laterality: N/A;   VASECTOMY     Social History   Social History Narrative   Lives at home with wife, not working disabled from the city of [place]   They have an adopted son at home (age 67  2022)   Former smoker no alcohol tobacco or drug use now   family history includes Congestive Heart Failure in his mother; Heart attack in his paternal grandfather and paternal grandmother; Heart attack (age of onset: 63) in his father; Heart failure (age of onset: 33) in his mother.   Review of Systems As per HPI  Objective:   Physical Exam BP 120/66   Pulse (!) 51   Ht 5' 9 (1.753 m)   Wt 213 lb 9.6 oz (96.9 kg)   BMI 31.54 kg/m      [1]  Allergies Allergen Reactions   Alpha-Gal Rash and Other (See Comments)    SEVERE ALLERGY  TO ANY MEAT OR MEAT DERIVED PRODUCTS FROM 4 LEGGED  ANIMALS > > BEEF, PORK , GOATS, DEER, ETC. < < RESULT OF BITE FROM LONE STAR TICK   Bovine (Beef) Protein Rash and Other (See Comments)   Bovine (Beef) Protein-Containing Drug Products Rash and Other (See Comments)   Lambs Quarters Rash and Other (See Comments)   Porcine (Pork) Protein-Containing Drug Products Rash and Other (See Comments)   Prednisone  Other (See Comments)    im depo medrol  caused dizziness - pt passed out. Ask pt before giving. Pt can take oral prednisone     Brilinta  [Ticagrelor ] Hives    Pt ate pork on the same day he took Brilinta  before knowing he had alpha gal. May have been the alpha gal reaction, pt is unsure.    Lyrica [Pregabalin]     Dry mouth and felt raw   Methylprednisolone  Sodium Succ     im depo medrol  caused dizziness - pt passed out. Ask pt before giving. Issue occurred when administered in the left arm    Penicillins Hives    Has tolerated since   Shellfish Allergy  Hives   Adhesive [Tape] Rash   Doxycycline Hives, Swelling and Rash   Fentanyl  Rash    Reaction to adhesive, not the drug   [2]  Current Meds  Medication Sig   acetaminophen  (TYLENOL ) 500 MG tablet Take 1,000 mg by mouth every 8 (eight) hours as needed for moderate pain (pain score 4-6).   albuterol  (VENTOLIN  HFA) 108 (90 Base) MCG/ACT inhaler TAKE 2 PUFFS BY MOUTH EVERY 6 HOURS AS NEEDED FOR WHEEZE OR SHORTNESS OF BREATH   amLODipine  (NORVASC ) 2.5 MG tablet Take 1 tablet (2.5 mg total) by mouth daily.   aspirin  EC 325 MG tablet Take 1 tablet (325 mg total) by mouth daily.   atorvastatin  (LIPITOR ) 80 MG tablet TAKE 1 TABLET BY MOUTH EVERYDAY AT BEDTIME   cetirizine  (ZYRTEC ) 10 MG tablet Take 10 mg by mouth daily.   EPINEPHrine  0.3 mg/0.3 mL IJ SOAJ injection Inject 0.3 mg into the muscle as needed for anaphylaxis.   ezetimibe  (ZETIA ) 10 MG tablet Take 1 tablet (10 mg total) by mouth daily.   fluticasone  (FLONASE ) 50 MCG/ACT nasal spray Place 2 sprays into both nostrils daily as needed for  allergies or rhinitis.   hydrocortisone  (ANUSOL -HC) 2.5 % rectal cream Place rectally 2 (two) times daily as needed for hemorrhoids or anal itching.   levothyroxine  (SYNTHROID ) 100 MCG tablet Take 1 tablet (100 mcg total) by mouth daily.   MAGNESIUM  GLYCINATE PO Take 2 tablets by mouth  at bedtime.   olmesartan  (BENICAR ) 40 MG tablet Take 1 tablet (40 mg total) by mouth daily.   ondansetron  (ZOFRAN -ODT) 4 MG disintegrating tablet TAKE 1 TABLET BY MOUTH EVERY 8 HOURS AS NEEDED FOR NAUSEA AND VOMITING   pantoprazole  (PROTONIX ) 40 MG tablet Take 40 mg by mouth 2 (two) times daily.   PARoxetine  (PAXIL ) 20 MG tablet Take 1 tablet (20 mg total) by mouth daily.   Current Facility-Administered Medications for the 08/27/24 encounter (Office Visit) with Avram Lupita BRAVO, MD  Medication   cyanocobalamin  (VITAMIN B12) injection 1,000 mcg   "

## 2024-08-28 ENCOUNTER — Encounter (HOSPITAL_COMMUNITY)
Admission: RE | Admit: 2024-08-28 | Discharge: 2024-08-28 | Disposition: A | Source: Ambulatory Visit | Attending: Cardiology

## 2024-08-28 DIAGNOSIS — Z951 Presence of aortocoronary bypass graft: Secondary | ICD-10-CM

## 2024-08-28 NOTE — Progress Notes (Signed)
 Daily Session Note  Patient Details  Name: AMEIR FARIA MRN: [id number] Date of Birth: [birth year] Referring Provider:   Flowsheet Row CARDIAC REHAB PHASE II ORIENTATION from 06/29/2024 in [facility] CARDIAC REHABILITATION  Referring Provider Jordan, Peter MD    Encounter Date: 08/28/2024  Check In:  Session Check In - 08/28/24 0903       Check-In   Supervising physician immediately available to respond to emergencies See telemetry face sheet for immediately available MD    Location [facility]    Staff Present Laymon Rattler, BSN, RN, WTA-C;Heather Con, BS, Exercise Physiologist    Virtual Visit No    Medication changes reported     No    Fall or balance concerns reported    No    Tobacco Cessation No Change    Warm-up and Cool-down Performed on first and last piece of equipment    Resistance Training Performed Yes    VAD Patient? No    PAD/SET Patient? No      Pain Assessment   Currently in Pain? No/denies          Capillary Blood Glucose: No results found for this or any previous visit (from the past 24 hours).    Tobacco Use History[1]  Goals Met:  Independence with exercise equipment Exercise tolerated well No report of concerns or symptoms today Strength training completed today  Goals Unmet:  Not Applicable  Comments: Pt able to follow exercise prescription today without complaint.  Will continue to monitor for progression.        [1]  Social History Tobacco Use  Smoking Status Former   Current packs/day: 0.00   Average packs/day: 1 pack/day for 20.0 years (20.0 ttl pk-yrs)   Types: Cigarettes   Start date: 11/28/1986   Quit date: 11/28/2006   Years since quitting: 17.7  Smokeless Tobacco Never

## 2024-08-31 ENCOUNTER — Encounter (HOSPITAL_COMMUNITY)

## 2024-09-01 ENCOUNTER — Encounter (HOSPITAL_COMMUNITY): Payer: Self-pay | Admitting: *Deleted

## 2024-09-01 DIAGNOSIS — Z951 Presence of aortocoronary bypass graft: Secondary | ICD-10-CM

## 2024-09-01 NOTE — Progress Notes (Signed)
 Cardiac Individual Treatment Plan  Patient Details  Name: Jeffrey Hooper MRN: [id number] Date of Birth: March 12, 1961 Referring Provider:   Flowsheet Row CARDIAC REHAB PHASE II ORIENTATION from 06/29/2024 in [facility]  Referring Provider Jordan, Peter MD    Initial Encounter Date:  Flowsheet Row CARDIAC REHAB PHASE II ORIENTATION from 06/29/2024 in [place] [facility]  Date 06/29/24    Visit Diagnosis: S/P CABG x 1  Patient's Home Medications on Admission: Current Medications[1]  Past Medical History: Past Medical History:  Diagnosis Date   Abnormal nuclear stress test 12/02/2015   Allergy     Allergy  to alpha-gal    Anxiety    Arthritis    CAD (coronary artery disease), native coronary artery 12/03/2015   Coronary artery disease    Difficult intubation    anterior larynx and limited oral opening, glidescope used electively   Dysrhythmia    PACs and SVT   GERD (gastroesophageal reflux disease)    Head injury with loss of consciousness (HCC)    back in the 1980's   History of hiatal hernia    History of kidney stones    Hyperlipidemia    Hypertension    Hypothyroid    Multiple benign nevi    Neuromuscular disorder (HCC) 2011   LEFT RADIAL NERVE SURGERY R/T TRAUMA   Pneumonia 2014, 2017   Prostatism    Pulmonary nodule    Spinal cord stimulator status    told to bring remote to surgery    Tobacco Use: Tobacco Use History[2]  Labs: Review Flowsheet  More data exists      Latest Ref Rng & Units 08/30/2023 12/23/2023 05/19/2024 05/21/2024 06/10/2024  Labs for ITP Cardiac and Pulmonary Rehab  Cholestrol 100 - 199 mg/dL 854  857  - - 880   LDL (calc) 0 - 99 mg/dL 73  69  - - 50   HDL-C >39 mg/dL 45  46  - - 52   Trlycerides 0 - 149 mg/dL 843  866  - - 89   Hemoglobin A1c 4.8 - 5.6 % 5.7  - 5.8  - -  PH, Arterial 7.35 - 7.45 - - - 7.352  7.367  7.460  7.439  7.418  7.355  -  PCO2 arterial 32 - 48 mmHg - - - 37.8  36.4  32.0   32.1  33.4  41.9  -  Bicarbonate 20.0 - 28.0 mmol/L - - - 21.0  21.0  23.4  21.7  21.5  23.4  -  TCO2 22 - 32 mmol/L - - - 22  22  25  23  22  23  22  25  25   -  Acid-base deficit 0.0 - 2.0 mmol/L - - - 4.0  4.0  1.0  2.0  2.0  2.0  -  O2 Saturation % - - - 97  98  97  100  100  100  -    Details       Multiple values from one day are sorted in reverse-chronological order         Capillary Blood Glucose: Lab Results  Component Value Date   GLUCAP 93 05/26/2024   GLUCAP 130 (H) 05/25/2024   GLUCAP 98 05/25/2024   GLUCAP 142 (H) 05/25/2024   GLUCAP 99 05/25/2024     Exercise Target Goals: Exercise Program Goal: Individual exercise prescription set using results from initial 6 min walk test and THRR while considering  patients activity barriers and safety.  Exercise Prescription Goal: Starting with aerobic activity 30 plus minutes a day, 3 days per week for initial exercise prescription. Provide home exercise prescription and guidelines that participant acknowledges understanding prior to discharge.  Activity Barriers & Risk Stratification:  Activity Barriers & Cardiac Risk Stratification - 06/29/24 0758       Activity Barriers & Cardiac Risk Stratification   Activity Barriers Neck/Spine Problems;Back Problems;Assistive Device;Shortness of Breath;Arthritis   Patient reports several fused disc in cervical and lumbar areas with chronic pain. He has pins in hips due to degenerative OA. He uses a straight cane for ambulation.   Cardiac Risk Stratification High          6 Minute Walk:  6 Minute Walk     Row Name 06/29/24 0932         6 Minute Walk   Phase Initial     Distance 1000 feet     Walk Time 6 minutes     # of Rest Breaks 0     MPH 1.9     METS 2.4     RPE 13     Perceived Dyspnea  1     VO2 Peak 8.4     Symptoms No     Resting HR 56 bpm     Resting BP 130/66     Resting Oxygen Saturation  97 %     Exercise Oxygen Saturation  during 6 min walk 97 %      Max Ex. HR 76 bpm     Max Ex. BP 140/60     2 Minute Post BP 132/60        Oxygen Initial Assessment:   Oxygen Re-Evaluation:   Oxygen Discharge (Final Oxygen Re-Evaluation):   Initial Exercise Prescription:  Initial Exercise Prescription - 06/29/24 0900       Date of Initial Exercise RX and Referring Provider   Date 06/29/24    Referring Provider Jordan, Peter MD      NuStep   Level 2    SPM 50    Minutes 15    METs 1.9      Arm Ergometer   Level 1    RPM 50    Minutes 15    METs 1.9      Prescription Details   Frequency (times per week) 3    Duration Progress to 30 minutes of continuous aerobic without signs/symptoms of physical distress      Intensity   THRR 40-80% of Max Heartrate 96-137    Ratings of Perceived Exertion 11-13    Perceived Dyspnea 0-4      Resistance Training   Training Prescription Yes    Weight 4    Reps 10-15          Perform Capillary Blood Glucose checks as needed.  Exercise Prescription Changes:   Exercise Prescription Changes     Row Name 06/29/24 0900 07/21/24 0800 08/12/24 1300         Response to Exercise   Blood Pressure (Admit) 130/66 124/60 104/70     Blood Pressure (Exercise) 140/60 148/68 --     Blood Pressure (Exit) 132/60 106/64 110/50     Heart Rate (Admit) 56 bpm 64 bpm 63 bpm     Heart Rate (Exercise) 76 bpm 83 bpm 101 bpm     Heart Rate (Exit) 62 bpm 77 bpm 69 bpm     Oxygen Saturation (Admit) 97 % -- --     Oxygen Saturation (Exercise) 97 % -- --  Oxygen Saturation (Exit) 97 % -- --     Rating of Perceived Exertion (Exercise) 13 12 13      Perceived Dyspnea (Exercise) 1 -- --     Duration -- Continue with 30 min of aerobic exercise without signs/symptoms of physical distress. Continue with 30 min of aerobic exercise without signs/symptoms of physical distress.     Intensity -- THRR unchanged THRR unchanged       Progression   Progression -- Continue to progress workloads to maintain  intensity without signs/symptoms of physical distress. Continue to progress workloads to maintain intensity without signs/symptoms of physical distress.       Resistance Training   Weight -- 4 4     Reps -- 10-15 10-15       Treadmill   MPH -- -- 2.2     Grade -- -- 1.5     Minutes -- -- 15     METs -- -- 3.14       NuStep   Level -- -- 7     SPM -- -- 111     Minutes -- -- 15     METs -- -- 2.8       Arm Ergometer   Level -- 2 --     RPM -- 54 --     Minutes -- 15 --     METs -- 2.5 --        Exercise Comments:   Exercise Comments     Row Name 07/01/24 0909           Exercise Comments First full day of exercise!  Patient was oriented to gym and equipment including functions, settings, policies, and procedures.  Patient's individual exercise prescription and treatment plan were reviewed.  All starting workloads were established based on the results of the 6 minute walk test done at initial orientation visit.  The plan for exercise progression was also introduced and progression will be customized based on patient's performance and goals.          Exercise Goals and Review:   Exercise Goals     Row Name 06/29/24 [phone]             Exercise Goals   Increase Physical Activity Yes       Intervention Provide advice, education, support and counseling about physical activity/exercise needs.;Develop an individualized exercise prescription for aerobic and resistive training based on initial evaluation findings, risk stratification, comorbidities and participant's personal goals.       Expected Outcomes Short Term: Attend rehab on a regular basis to increase amount of physical activity.;Long Term: Add in home exercise to make exercise part of routine and to increase amount of physical activity.;Long Term: Exercising regularly at least 3-5 days a week.       Increase Strength and Stamina Yes       Intervention Provide advice, education, support and counseling about physical  activity/exercise needs.;Develop an individualized exercise prescription for aerobic and resistive training based on initial evaluation findings, risk stratification, comorbidities and participant's personal goals.       Expected Outcomes Short Term: Increase workloads from initial exercise prescription for resistance, speed, and METs.;Short Term: Perform resistance training exercises routinely during rehab and add in resistance training at home;Long Term: Improve cardiorespiratory fitness, muscular endurance and strength as measured by increased METs and functional capacity ( )       Able to understand and use rate of perceived exertion (RPE) scale Yes  Intervention Provide education and explanation on how to use RPE scale       Expected Outcomes Short Term: Able to use RPE daily in rehab to express subjective intensity level;Long Term:  Able to use RPE to guide intensity level when exercising independently       Able to understand and use Dyspnea scale Yes       Intervention Provide education and explanation on how to use Dyspnea scale       Expected Outcomes Short Term: Able to use Dyspnea scale daily in rehab to express subjective sense of shortness of breath during exertion;Long Term: Able to use Dyspnea scale to guide intensity level when exercising independently       Knowledge and understanding of Target Heart Rate Range (THRR) Yes       Intervention Provide education and explanation of THRR including how the numbers were predicted and where they are located for reference       Expected Outcomes Short Term: Able to state/look up THRR;Long Term: Able to use THRR to govern intensity when exercising independently;Short Term: Able to use daily as guideline for intensity in rehab       Able to check pulse independently Yes       Intervention Provide education and demonstration on how to check pulse in carotid and radial arteries.;Review the importance of being able to check your own pulse for  safety during independent exercise       Expected Outcomes Short Term: Able to explain why pulse checking is important during independent exercise;Long Term: Able to check pulse independently and accurately       Understanding of Exercise Prescription Yes       Intervention Provide education, explanation, and written materials on patient's individual exercise prescription       Expected Outcomes Short Term: Able to explain program exercise prescription;Long Term: Able to explain home exercise prescription to exercise independently          Exercise Goals Re-Evaluation :  Exercise Goals Re-Evaluation     Row Name 07/01/24 0909 07/13/24 1227 08/10/24 0925         Exercise Goal Re-Evaluation   Exercise Goals Review Increase Physical Activity;Increase Strength and Stamina;Able to understand and use Dyspnea scale;Able to check pulse independently;Knowledge and understanding of Target Heart Rate Range (THRR);Able to understand and use rate of perceived exertion (RPE) scale;Understanding of Exercise Prescription Increase Physical Activity;Increase Strength and Stamina;Understanding of Exercise Prescription Increase Physical Activity;Increase Strength and Stamina;Able to understand and use rate of perceived exertion (RPE) scale     Comments Reviewed RPE and dyspnea scale, THR and program prescription with pt today.  Pt voiced understanding and was given a copy of goals to take home. Nyree is doing well in rehab! His attendance is really well. He doesn't do much exercise at home due to his hip pain, but he is coming here 3 days a week. He states he is going to try walking on the treadmill the days he isn't here when his hip feels better. Nasier is doing well in rehab! His attendance is really well. He comes here 3 times a week and is increasing his levels on the Nustep and treadmill. He does some walking at home and household chores.     Expected Outcomes Short: Use RPE daily to regulate intensity.  Long:  Follow program prescription in THR. Short: Continue to attend rehab. Long: Incorporate exercise at home. Short: Continue to attend rehab. Long: Incorporate exercise at home.  Discharge Exercise Prescription (Final Exercise Prescription Changes):  Exercise Prescription Changes - 08/12/24 1300       Response to Exercise   Blood Pressure (Admit) 104/70    Blood Pressure (Exit) 110/50    Heart Rate (Admit) 63 bpm    Heart Rate (Exercise) 101 bpm    Heart Rate (Exit) 69 bpm    Rating of Perceived Exertion (Exercise) 13    Duration Continue with 30 min of aerobic exercise without signs/symptoms of physical distress.    Intensity THRR unchanged      Progression   Progression Continue to progress workloads to maintain intensity without signs/symptoms of physical distress.      Resistance Training   Weight 4    Reps 10-15      Treadmill   MPH 2.2    Grade 1.5    Minutes 15    METs 3.14      NuStep   Level 7    SPM 111    Minutes 15    METs 2.8          Nutrition:  Target Goals: Understanding of nutrition guidelines, daily intake of sodium 1500mg , cholesterol 200mg , calories 30% from fat and 7% or less from saturated fats, daily to have 5 or more servings of fruits and vegetables.  Biometrics:  Pre Biometrics - 06/29/24 0935       Pre Biometrics   Height 5' 9 (1.753 m)    Weight 208 lb 15.9 oz (94.8 kg)    Waist Circumference 46 inches    Hip Circumference 42 inches    Waist to Hip Ratio 1.1 %    BMI (Calculated) 30.85    Grip Strength 31.8 kg    Single Leg Stand 0 seconds           Nutrition Therapy Plan and Nutrition Goals:   Nutrition Assessments:  MEDIFICTS Score Key: >=70 Need to make dietary changes  40-70 Heart Healthy Diet <= 40 Therapeutic Level Cholesterol Diet  Flowsheet Row CARDIAC REHAB PHASE II ORIENTATION from 06/29/2024 in [facility]  Picture Your Plate Total Score on Admission 46   Picture Your Plate  Scores: <59 Unhealthy dietary pattern with much room for improvement. 41-50 Dietary pattern unlikely to meet recommendations for good health and room for improvement. 51-60 More healthful dietary pattern, with some room for improvement.  >60 Healthy dietary pattern, although there may be some specific behaviors that could be improved.    Nutrition Goals Re-Evaluation:  Nutrition Goals Re-Evaluation     Row Name 07/13/24 1224 08/10/24 9076           Goals   Nutrition Goal Healthy eating. Healthy eating.      Comment Miquel is doing well with his diet. He states he tries to eat healthy and drinks enough water . He does some of his favorite things in moderation. Michaell states he is doing well with his diet. He is not adding any extra salt to anything and uses Mrs. DASH as a substitute. He is drinking enough water .      Expected Outcome Short: Continue adequate water  intake. Long: Continue healthy eating and sweets in moderation. Short: Continue adequate water  intake. Long: Continue the no added salt diet.         Nutrition Goals Discharge (Final Nutrition Goals Re-Evaluation):  Nutrition Goals Re-Evaluation - 08/10/24 0923       Goals   Nutrition Goal Healthy eating.    Comment Mackay states he is doing  well with his diet. He is not adding any extra salt to anything and uses Mrs. DASH as a substitute. He is drinking enough water .    Expected Outcome Short: Continue adequate water  intake. Long: Continue the no added salt diet.          Psychosocial: Target Goals: Acknowledge presence or absence of significant depression and/or stress, maximize coping skills, provide positive support system. Participant is able to verbalize types and ability to use techniques and skills needed for reducing stress and depression.  Initial Review & Psychosocial Screening:  Initial Psych Review & Screening - 06/29/24 0836       Initial Review   Current issues with None Identified      Family Dynamics    Good Support System? Yes      Barriers   Psychosocial barriers to participate in program The patient should benefit from training in stress management and relaxation.;There are no identifiable barriers or psychosocial needs.      Screening Interventions   Interventions Program counselor consult;To provide support and resources with identified psychosocial needs;Provide feedback about the scores to participant    Expected Outcomes Short Term goal: Utilizing psychosocial counselor, staff and physician to assist with identification of specific Stressors or current issues interfering with healing process. Setting desired goal for each stressor or current issue identified.;Long Term Goal: Stressors or current issues are controlled or eliminated.;Short Term goal: Identification and review with participant of any Quality of Life or Depression concerns found by scoring the questionnaire.;Long Term goal: The participant improves quality of Life and PHQ9 Scores as seen by post scores and/or verbalization of changes          Quality of Life Scores:  Quality of Life - 06/29/24 0937       Quality of Life   Select Quality of Life      Quality of Life Scores   Health/Function Pre 26.8 %    Socioeconomic Pre 30 %    Psych/Spiritual Pre 30 %    Family Pre 28.8 %    GLOBAL Pre 28.36 %         Scores of 19 and below usually indicate a poorer quality of life in these areas.  A difference of  2-3 points is a clinically meaningful difference.  A difference of 2-3 points in the total score of the Quality of Life Index has been associated with significant improvement in overall quality of life, self-image, physical symptoms, and general health in studies assessing change in quality of life.  PHQ-9: Review Flowsheet  More data exists      07/22/2024 06/29/2024 05/28/2024 03/09/2024 01/07/2024  Depression screen PHQ 2/9  Decreased Interest 0 0 0 0 0  Down, Depressed, Hopeless 0 0 0 0 0  PHQ - 2 Score 0  0 0 0 0  Altered sleeping - 1 - 1 0  Tired, decreased energy - 1 - 2 0  Change in appetite - 0 - 0 0  Feeling bad or failure about yourself  - 0 - 0 0  Trouble concentrating - 1 - 1 0  Moving slowly or fidgety/restless - 0 - 1 0  Suicidal thoughts - 0 - 0 0  PHQ-9 Score - 3 - 5  0   Difficult doing work/chores - Not difficult at all - Not difficult at all Not difficult at all    Details       Data saved with a previous flowsheet row definition  Interpretation of Total Score  Total Score Depression Severity:  1-4 = Minimal depression, 5-9 = Mild depression, 10-14 = Moderate depression, 15-19 = Moderately severe depression, 20-27 = Severe depression   Psychosocial Evaluation and Intervention:  Psychosocial Evaluation - 06/29/24 0838       Psychosocial Evaluation & Interventions   Interventions Stress management education;Relaxation education;Encouraged to exercise with the program and follow exercise prescription    Comments Patient was referred to cardiac rehab with CABGx1. He completed the program in 2017 with a stent. He denies any depression, anxiety or stressors. He reports sleeping okay. He usually sleeps in a recliner due to his chronic bilateral hip and back pain. He lives with is wife who is his main support along with his 2 adult sons. He has an adopted son age 65. 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 He is very active in his church and does volunteer work in a advertising copywriter. His goals for the program to make sure his heart is doing okay, his heart rate is good and he wants to be as active as he can.  His chronic pain could be a barrier for him but he hopes to be able to exercise without increasing his pain.    Expected Outcomes Short Term: Patient will start the program and attend consistently. Long Term: Patient will complete the program meeting personal goals.    Continue Psychosocial Services  Follow up required by staff          Psychosocial  Re-Evaluation:  Psychosocial Re-Evaluation     Row Name 07/13/24 1222 08/10/24 9076           Psychosocial Re-Evaluation   Current issues with None Identified None Identified      Comments Calven is doing well in rehab! He always has such a positive attitude and he doesn't identify any stressors currently in his life. He is sleeping ok as well. He is very active in the community with his local [employer], [employer] and with a local food bank. Kyion is doing well in rehab! He always has such a positive attitude and he doesn't identify any stressors currently in his life. He is sleeping ok as well. He is very active in the community with his local [employer], the [employer] and with a local food bank.      Expected Outcomes Short: Continue to attend rehab. Long: Continue to stay active in the community. Short: Continue to attend rehab. Long: Continue to stay active in the community.      Interventions Encouraged to attend Cardiac Rehabilitation for the exercise Encouraged to attend Cardiac Rehabilitation for the exercise      Continue Psychosocial Services  Follow up required by staff Follow up required by staff         Psychosocial Discharge (Final Psychosocial Re-Evaluation):  Psychosocial Re-Evaluation - 08/10/24 9076       Psychosocial Re-Evaluation   Current issues with None Identified    Comments Fremon is doing well in rehab! He always has such a positive attitude and he doesn't identify any stressors currently in his life. He is sleeping ok as well. He is very active in the community with his local [employer], [employer] and with a local food bank.    Expected Outcomes Short: Continue to attend rehab. Long: Continue to stay active in the community.    Interventions Encouraged to attend Cardiac Rehabilitation for the exercise    Continue  Psychosocial Services  Follow up required by staff          Vocational Rehabilitation: Provide vocational  rehab assistance to qualifying candidates.   Vocational Rehab Evaluation & Intervention:  Vocational Rehab - 06/29/24 [phone]       Initial Vocational Rehab Evaluation & Intervention   Assessment shows need for Vocational Rehabilitation No      Vocational Rehab Re-Evaulation   Comments Patient is retired.          Education: Education Goals: Education classes will be provided on a weekly basis, covering required topics. Participant will state understanding/return demonstration of topics presented.  Learning Barriers/Preferences:  Learning Barriers/Preferences - 06/29/24 0834       Learning Barriers/Preferences   Learning Barriers None    Learning Preferences Skilled Demonstration          Education Topics: Hypertension, Hypertension Reduction -Define heart disease and high blood pressure. Discus how high blood pressure affects the body and ways to reduce high blood pressure.   Exercise and Your Heart -Discuss why it is important to exercise, the FITT principles of exercise, normal and abnormal responses to exercise, and how to exercise safely. Flowsheet Row CARDIAC REHAB PHASE II EXERCISE from 04/04/2016 in [place] [facility]  Date 03/14/16  Educator DC  Instruction Review Code (retired) 2- meets goals/outcomes    Angina -Discuss definition of angina, causes of angina, treatment of angina, and how to decrease risk of having angina. Flowsheet Row CARDIAC REHAB PHASE II EXERCISE from 04/04/2016 in [place] [facility]  Date 03/21/16  Educator Loa Sheets  Instruction Review Code 2- meets goals/outcomes    Cardiac Medications -Review what the following cardiac medications are used for, how they affect the body, and side effects that may occur when taking the medications.  Medications include Aspirin , Beta blockers, calcium  channel blockers, ACE Inhibitors, angiotensin receptor blockers, diuretics, digoxin, and antihyperlipidemics. Flowsheet Row  CARDIAC REHAB PHASE II EXERCISE from 04/04/2016 in [place] [facility]  Date 03/28/16  Educator Loa Sheets  Instruction Review Code 2- meets goals/outcomes    Congestive Heart Failure -Discuss the definition of CHF, how to live with CHF, the signs and symptoms of CHF, and how keep track of weight and sodium intake. Flowsheet Row CARDIAC REHAB PHASE II EXERCISE from 04/04/2016 in [place] [facility]  Date 04/04/16  Educator DC  Instruction Review Code 2- meets goals/outcomes    Heart Disease and Intimacy -Discus the effect sexual activity has on the heart, how changes occur during intimacy as we age, and safety during sexual activity. Flowsheet Row CARDIAC REHAB PHASE II EXERCISE from 04/04/2016 in [place] [facility]  Date 01/11/16  Educator DC  Instruction Review Code (retired) 2- meets goals/outcomes    Smoking Cessation / COPD -Discuss different methods to quit smoking, the health benefits of quitting smoking, and the definition of COPD. Flowsheet Row CARDIAC REHAB PHASE II EXERCISE from 04/04/2016 in [place] [facility]  Date 01/18/16  Educator Loa Sheets  Instruction Review Code (retired) 2- meets goals/outcomes    Nutrition I: Fats -Discuss the types of cholesterol, what cholesterol does to the heart, and how cholesterol levels can be controlled. Flowsheet Row CARDIAC REHAB PHASE II EXERCISE from 04/04/2016 in [place] [facility]  Date 01/25/16  Educator Loa Sheets  Instruction Review Code 2- meets goals/outcomes    Nutrition II: Labels -Discuss the different components of food labels and how to read food label Flowsheet Row CARDIAC REHAB PHASE II  EXERCISE from 04/04/2016 in [place] [facility]  Date 02/01/16  Educator Loa Sheets  Instruction Review Code 2- meets goals/outcomes    Heart Parts/Heart Disease and PAD -Discuss the anatomy of the heart, the pathway of blood circulation  through the heart, and these are affected by heart disease. Flowsheet Row CARDIAC REHAB PHASE II EXERCISE from 04/04/2016 in [place] [facility]  Date 02/08/16  Educator DC  Instruction Review Code 2- meets goals/outcomes    Stress I: Signs and Symptoms -Discuss the causes of stress, how stress may lead to anxiety and depression, and ways to limit stress.   Stress II: Relaxation -Discuss different types of relaxation techniques to limit stress.   Warning Signs of Stroke / TIA -Discuss definition of a stroke, what the signs and symptoms are of a stroke, and how to identify when someone is having stroke. Flowsheet Row CARDIAC REHAB PHASE II EXERCISE from 04/04/2016 in [place] [facility]  Date 02/29/16  Educator DJ/DC  Instruction Review Code 2- meets goals/outcomes    Knowledge Questionnaire Score:  Knowledge Questionnaire Score - 06/29/24 0911       Knowledge Questionnaire Score   Pre Score 18/26          Core Components/Risk Factors/Patient Goals at Admission:  Personal Goals and Risk Factors at Admission - 06/29/24 0835       Core Components/Risk Factors/Patient Goals on Admission    Weight Management Weight Maintenance    Improve shortness of breath with ADL's Yes    Intervention Provide education, individualized exercise plan and daily activity instruction to help decrease symptoms of SOB with activities of daily living.    Expected Outcomes Short Term: Improve cardiorespiratory fitness to achieve a reduction of symptoms when performing ADLs;Long Term: Be able to perform more ADLs without symptoms or delay the onset of symptoms    Hypertension Yes    Intervention Provide education on lifestyle modifcations including regular physical activity/exercise, weight management, moderate sodium restriction and increased consumption of fresh fruit, vegetables, and low fat dairy, alcohol moderation, and smoking cessation.;Monitor prescription use  compliance.    Expected Outcomes Short Term: Continued assessment and intervention until BP is < 140/26mm HG in hypertensive participants. < 130/13mm HG in hypertensive participants with diabetes, heart failure or chronic kidney disease.;Long Term: Maintenance of blood pressure at goal levels.    Lipids Yes    Intervention Provide education and support for participant on nutrition & aerobic/resistive exercise along with prescribed medications to achieve LDL 70mg , HDL >40mg .    Expected Outcomes Short Term: Participant states understanding of desired cholesterol values and is compliant with medications prescribed. Participant is following exercise prescription and nutrition guidelines.;Long Term: Cholesterol controlled with medications as prescribed, with individualized exercise RX and with personalized nutrition plan. Value goals: LDL < 70mg , HDL > 40 mg.          Core Components/Risk Factors/Patient Goals Review:   Goals and Risk Factor Review     Row Name 07/13/24 1226 08/10/24 0924           Core Components/Risk Factors/Patient Goals Review   Personal Goals Review Hypertension;Lipids Hypertension;Lipids      Review Deval is doing well in rehab. He takes all his medicines as prescribed and checks his BP everyday. His wife is a engineer, civil (consulting) so she helps keep him on track. Amariyon is doing well in rehab! He takes all his medicines as prescribed and takes his BP everyday. He recently had to wear a heart monitor  for his palputations, she is awaiting the doctor to give him the results.      Expected Outcomes Short: Continue to attend rehab. Long: Continue checking vitals and report abnormalities. Short: Continue to attend rehab. Long: Continue checking vitals and report abnormalities.         Core Components/Risk Factors/Patient Goals at Discharge (Final Review):   Goals and Risk Factor Review - 08/10/24 0924       Core Components/Risk Factors/Patient Goals Review   Personal Goals Review  Hypertension;Lipids    Review Cristian is doing well in rehab! He takes all his medicines as prescribed and takes his BP everyday. He recently had to wear a heart monitor for his palputations, she is awaiting the doctor to give him the results.    Expected Outcomes Short: Continue to attend rehab. Long: Continue checking vitals and report abnormalities.          ITP Comments:  ITP Comments     Row Name 06/29/24 1146 07/01/24 0909 07/07/24 0919 08/04/24 1228 09/01/24 0958   ITP Comments Patient arrived for 1st visit/orientation/education at 0800. Patient was referred to CR by Dr. James Hochrien/[name] attending MD due to Status Post CABGx1. During orientation advised patient on arrival and appointment times what to wear, what to do before, during and after exercise. Reviewed attendance and class policy.  Pt is scheduled to return Cardiac Rehab on 07/01/24 at 915. Pt was advised to come to class 15 minutes before class starts.  Discussed RPE/Dpysnea scales. Patient participated in warm up stretches. Patient was able to complete 6 minute walk test.  Telemetry:NSR. Patient was measured for the equipment. Discussed equipment safety with patient. Took patient pre-anthropometric measurements. Patient finished visit at 930. First full day of exercise!  Patient was oriented to gym and equipment including functions, settings, policies, and procedures.  Patient's individual exercise prescription and treatment plan were reviewed.  All starting workloads were established based on the results of the 6 minute walk test done at initial orientation visit.  The plan for exercise progression was also introduced and progression will be customized based on patient's performance and goals. 30 day review completed. ITP sent to Dr. Dorn Ross, Medical Director of Cardiac Rehab. Continue with ITP unless changes are made by physician.  New to program 30 day review completed. ITP sent to Dr. Dorn Ross, Medical  Director of Cardiac Rehab. Continue with ITP unless changes are made by physician. 30 day review completed. ITP sent to Dr. Dorn Ross, Medical Director of Cardiac Rehab. Continue with ITP unless changes are made by physician.      Comments: 30 day review     [1]  Current Outpatient Medications:    acetaminophen  (TYLENOL ) 500 MG tablet, Take 1,000 mg by mouth every 8 (eight) hours as needed for moderate pain (pain score 4-6)., Disp: , Rfl:    albuterol  (VENTOLIN  HFA) 108 (90 Base) MCG/ACT inhaler, TAKE 2 PUFFS BY MOUTH EVERY 6 HOURS AS NEEDED FOR WHEEZE OR SHORTNESS OF BREATH, Disp: 18 each, Rfl: 1   amLODipine  (NORVASC ) 2.5 MG tablet, Take 1 tablet (2.5 mg total) by mouth daily., Disp: 30 tablet, Rfl: 1   aspirin  EC 325 MG tablet, Take 1 tablet (325 mg total) by mouth daily., Disp: , Rfl:    atorvastatin  (LIPITOR ) 80 MG tablet, TAKE 1 TABLET BY MOUTH EVERYDAY AT BEDTIME, Disp: 90 tablet, Rfl: 3   cetirizine  (ZYRTEC ) 10 MG tablet, Take 10 mg by mouth daily., Disp: , Rfl:  EPINEPHrine  0.3 mg/0.3 mL IJ SOAJ injection, Inject 0.3 mg into the muscle as needed for anaphylaxis., Disp: 2 each, Rfl: 0   ezetimibe  (ZETIA ) 10 MG tablet, Take 1 tablet (10 mg total) by mouth daily., Disp: 90 tablet, Rfl: 3   fluticasone  (FLONASE ) 50 MCG/ACT nasal spray, Place 2 sprays into both nostrils daily as needed for allergies or rhinitis., Disp: , Rfl:    hydrocortisone  (ANUSOL -HC) 2.5 % rectal cream, Place rectally 2 (two) times daily as needed for hemorrhoids or anal itching., Disp: 30 g, Rfl: 0   levothyroxine  (SYNTHROID ) 100 MCG tablet, Take 1 tablet (100 mcg total) by mouth daily., Disp: 90 tablet, Rfl: 3   MAGNESIUM  GLYCINATE PO, Take 2 tablets by mouth at bedtime., Disp: , Rfl:    olmesartan  (BENICAR ) 40 MG tablet, Take 1 tablet (40 mg total) by mouth daily., Disp: 90 tablet, Rfl: 3   ondansetron  (ZOFRAN -ODT) 4 MG disintegrating tablet, TAKE 1 TABLET BY MOUTH EVERY 8 HOURS AS NEEDED FOR NAUSEA AND  VOMITING, Disp: 20 tablet, Rfl: 0   pantoprazole  (PROTONIX ) 40 MG tablet, Take 40 mg by mouth 2 (two) times daily., Disp: , Rfl:    PARoxetine  (PAXIL ) 20 MG tablet, Take 1 tablet (20 mg total) by mouth daily., Disp: 90 tablet, Rfl: 3  Current Facility-Administered Medications:    cyanocobalamin  (VITAMIN B12) injection 1,000 mcg, 1,000 mcg, Intramuscular, Q30 days, Gottschalk, Ashly M, DO, 1,000 mcg at 08/21/24 1105 [2]  Social History Tobacco Use  Smoking Status Former   Current packs/day: 0.00   Average packs/day: 1 pack/day for 20.0 years (20.0 ttl pk-yrs)   Types: Cigarettes   Start date: 11/28/1986   Quit date: 11/28/2006   Years since quitting: 17.7  Smokeless Tobacco Never

## 2024-09-02 ENCOUNTER — Ambulatory Visit (INDEPENDENT_AMBULATORY_CARE_PROVIDER_SITE_OTHER): Payer: Medicare Other | Admitting: Family Medicine

## 2024-09-02 ENCOUNTER — Encounter: Payer: Self-pay | Admitting: Family Medicine

## 2024-09-02 ENCOUNTER — Other Ambulatory Visit

## 2024-09-02 ENCOUNTER — Encounter (HOSPITAL_COMMUNITY)

## 2024-09-02 VITALS — BP 137/76 | HR 57 | Temp 97.9°F | Ht 70.0 in | Wt 209.2 lb

## 2024-09-02 DIAGNOSIS — E538 Deficiency of other specified B group vitamins: Secondary | ICD-10-CM | POA: Insufficient documentation

## 2024-09-02 DIAGNOSIS — N411 Chronic prostatitis: Secondary | ICD-10-CM | POA: Diagnosis not present

## 2024-09-02 DIAGNOSIS — I25119 Atherosclerotic heart disease of native coronary artery with unspecified angina pectoris: Secondary | ICD-10-CM | POA: Diagnosis not present

## 2024-09-02 DIAGNOSIS — E782 Mixed hyperlipidemia: Secondary | ICD-10-CM

## 2024-09-02 DIAGNOSIS — D510 Vitamin B12 deficiency anemia due to intrinsic factor deficiency: Secondary | ICD-10-CM

## 2024-09-02 DIAGNOSIS — I10 Essential (primary) hypertension: Secondary | ICD-10-CM

## 2024-09-02 DIAGNOSIS — F411 Generalized anxiety disorder: Secondary | ICD-10-CM | POA: Diagnosis not present

## 2024-09-02 DIAGNOSIS — R7303 Prediabetes: Secondary | ICD-10-CM

## 2024-09-02 DIAGNOSIS — E039 Hypothyroidism, unspecified: Secondary | ICD-10-CM

## 2024-09-02 DIAGNOSIS — J101 Influenza due to other identified influenza virus with other respiratory manifestations: Secondary | ICD-10-CM

## 2024-09-02 DIAGNOSIS — I480 Paroxysmal atrial fibrillation: Secondary | ICD-10-CM

## 2024-09-02 DIAGNOSIS — Z0000 Encounter for general adult medical examination without abnormal findings: Secondary | ICD-10-CM

## 2024-09-02 LAB — CBC WITH DIFFERENTIAL/PLATELET
Basophils Absolute: 0 10*3/uL (ref 0.0–0.2)
Basos: 1 %
EOS (ABSOLUTE): 0.1 10*3/uL (ref 0.0–0.4)
Eos: 3 %
Hematocrit: 40.9 % (ref 37.5–51.0)
Hemoglobin: 13.1 g/dL (ref 13.0–17.7)
Immature Grans (Abs): 0 10*3/uL (ref 0.0–0.1)
Immature Granulocytes: 0 %
Lymphocytes Absolute: 1.1 10*3/uL (ref 0.7–3.1)
Lymphs: 30 %
MCH: 29 pg (ref 26.6–33.0)
MCHC: 32 g/dL (ref 31.5–35.7)
MCV: 91 fL (ref 79–97)
Monocytes Absolute: 0.4 10*3/uL (ref 0.1–0.9)
Monocytes: 12 %
Neutrophils Absolute: 1.9 10*3/uL (ref 1.4–7.0)
Neutrophils: 54 %
Platelets: 153 10*3/uL (ref 150–450)
RBC: 4.52 x10E6/uL (ref 4.14–5.80)
RDW: 13.7 % (ref 11.6–15.4)
WBC: 3.5 10*3/uL (ref 3.4–10.8)

## 2024-09-02 LAB — PSA: Prostate Specific Ag, Serum: 1 ng/mL (ref 0.0–4.0)

## 2024-09-02 LAB — TSH+FREE T4
Free T4: 1.62 ng/dL (ref 0.82–1.77)
TSH: 4.47 u[IU]/mL (ref 0.450–4.500)

## 2024-09-02 LAB — BAYER DCA HB A1C WAIVED: HB A1C (BAYER DCA - WAIVED): 5.7 % — ABNORMAL HIGH (ref 4.8–5.6)

## 2024-09-02 LAB — CMP14+EGFR
ALT: 30 [IU]/L (ref 0–44)
AST: 30 [IU]/L (ref 0–40)
Albumin: 4.2 g/dL (ref 3.9–4.9)
Alkaline Phosphatase: 117 [IU]/L (ref 47–123)
BUN/Creatinine Ratio: 11 (ref 10–24)
BUN: 13 mg/dL (ref 8–27)
Bilirubin Total: 0.5 mg/dL (ref 0.0–1.2)
CO2: 24 mmol/L (ref 20–29)
Calcium: 8.6 mg/dL (ref 8.6–10.2)
Chloride: 104 mmol/L (ref 96–106)
Creatinine, Ser: 1.14 mg/dL (ref 0.76–1.27)
Globulin, Total: 1.8 g/dL (ref 1.5–4.5)
Glucose: 92 mg/dL (ref 70–99)
Potassium: 3.8 mmol/L (ref 3.5–5.2)
Sodium: 142 mmol/L (ref 134–144)
Total Protein: 6 g/dL (ref 6.0–8.5)
eGFR: 72 mL/min/{1.73_m2}

## 2024-09-02 LAB — VERITOR SARS-COV-2 AND FLU A+B
BD Veritor SARS-CoV-2 Ag: NEGATIVE
Influenza A: POSITIVE — AB
Influenza B: NEGATIVE

## 2024-09-02 MED ORDER — OSELTAMIVIR PHOSPHATE 75 MG PO CAPS: 75.0000 mg | ORAL_CAPSULE | Freq: Two times a day (BID) | ORAL | 0 refills | Status: AC

## 2024-09-02 MED ORDER — BENZONATATE 200 MG PO CAPS: 200.0000 mg | ORAL_CAPSULE | Freq: Two times a day (BID) | ORAL | 0 refills | Status: AC | PRN

## 2024-09-02 MED ORDER — PAROXETINE HCL 20 MG PO TABS: 20.0000 mg | ORAL_TABLET | Freq: Every day | ORAL | 3 refills | Status: AC

## 2024-09-02 MED ORDER — LEVOTHYROXINE SODIUM 100 MCG PO TABS: 100.0000 ug | ORAL_TABLET | Freq: Every day | ORAL | 3 refills | Status: AC

## 2024-09-02 NOTE — Progress Notes (Signed)
 "  Jeffrey Hooper is a 64 y.o. male presents to office today for annual physical exam examination.    Patient reports he had onset of cough 2 to 3 days ago.  He reports associated congestion, rhinorrhea.  No fevers, chills, myalgia outside of baseline.  He suffers from chronic pain and is seen at [facility] for chronic pain management.  He is on Norco 5 mg and takes up to 4 a day but typically does not take that much.  Sometimes he goes totally without the medication.  He sees them monthly.  He does report that his wife was recently sick with influenza.  He has had his flu shot.  No chest or shortness of breath.  No wheezing.   Occupation: retired, Marital status: marrie, Substance use: none There are no preventive care reminders to display for this patient.  Immunization History  Administered Date(s) Administered   Influenza Inj Mdck Quad Pf 05/01/2018   Influenza Whole 05/02/2012   Influenza,inj,Quad PF,6+ Mos 04/28/2013, 05/18/2015, 05/06/2017, 04/21/2019, 04/24/2021, 04/23/2022   Influenza-Unspecified 04/27/2014, 05/14/2016, 04/13/2023, 04/17/2024   Janssen (J&J) SARS-COV-2 Vaccination 01/05/2020   MMR 09/29/1990   Moderna SARS-COV2 Booster Vaccination 07/31/2020   Moderna Sars-Covid-2 Vaccination 07/31/2020   PNEUMOCOCCAL CONJUGATE-20 06/10/2024   Td 04/12/2010   Tdap 05/16/2020   Zoster Recombinant(Shingrix) 08/08/2019, 12/17/2019   Past Medical History:  Diagnosis Date   Abnormal nuclear stress test 12/02/2015   Allergy     Allergy  to alpha-gal    Anxiety    Arthritis    CAD (coronary artery disease), native coronary artery 12/03/2015   Coronary artery disease    Difficult intubation    anterior larynx and limited oral opening, glidescope used electively   Dysrhythmia    PACs and SVT   GERD (gastroesophageal reflux disease)    Head injury with loss of consciousness (HCC)    back in the 1980's   History of hiatal hernia    History of kidney stones     Hyperlipidemia    Hypertension    Hypothyroid    Multiple benign nevi    Neuromuscular disorder (HCC) 2011   LEFT RADIAL NERVE SURGERY R/T TRAUMA   Pneumonia 2014, 2017   Prostatism    Pulmonary nodule    Spinal cord stimulator status    told to bring remote to surgery   Social History   Socioeconomic History   Marital status: Married    Spouse name: Not on file   Number of children: 3   Years of education: Not on file   Highest education level: GED or equivalent  Occupational History   Occupation: [employer]  Tobacco Use   Smoking status: Former    Current packs/day: 0.00    Average packs/day: 1 pack/day for 20.0 years (20.0 ttl pk-yrs)    Types: Cigarettes    Start date: 11/28/1986    Quit date: 11/28/2006    Years since quitting: 17.7   Smokeless tobacco: Never  Vaping Use   Vaping status: Never Used  Substance and Sexual Activity   Alcohol use: Not Currently    Comment: rarely   Drug use: Never   Sexual activity: Yes    Birth control/protection: Surgical  Other Topics Concern   Not on file  Social History Narrative   Lives at home with wife, not working disabled from the city of [place]   They have an adopted son at home (age 76  2022)   Former smoker no  alcohol tobacco or drug use now   Social Drivers of Health   Tobacco Use: Medium Risk (08/27/2024)   Patient History    Smoking Tobacco Use: Former    Smokeless Tobacco Use: Never    Passive Exposure: Not on file  Financial Resource Strain: Low Risk (06/06/2024)   Overall Financial Resource Strain (CARDIA)    Difficulty of Paying Living Expenses: Not hard at all  Food Insecurity: No Food Insecurity (06/06/2024)   Epic    Worried About Programme Researcher, Broadcasting/film/video in the Last Year: Never true    Ran Out of Food in the Last Year: Never true  Transportation Needs: No Transportation Needs (07/22/2024)   Epic    Lack of Transportation (Medical): No    Lack of Transportation (Non-Medical): No   Physical Activity: Insufficiently Active (07/22/2024)   Exercise Vital Sign    Days of Exercise per Week: 3 days    Minutes of Exercise per Session: 20 min  Stress: No Stress Concern Present (07/22/2024)   [employer] of Occupational Health - Occupational Stress Questionnaire    Feeling of Stress: Not at all  Social Connections: Socially Integrated (07/22/2024)   Social Connection and Isolation Panel    Frequency of Communication with Friends and Family: More than three times a week    Frequency of Social Gatherings with Friends and Family: Patient declined    Attends Religious Services: More than 4 times per year    Active Member of Clubs or Organizations: Patient declined    Attends Engineer, Structural: More than 4 times per year    Marital Status: Married  Catering Manager Violence: Not At Risk (07/22/2024)   Epic    Fear of Current or Ex-Partner: No    Emotionally Abused: No    Physically Abused: No    Sexually Abused: No  Depression (PHQ2-9): Low Risk (07/22/2024)   Depression (PHQ2-9)    PHQ-2 Score: 0  Alcohol Screen: Low Risk (08/30/2023)   Alcohol Screen    Last Alcohol Screening Score (AUDIT): 0  Housing: Low Risk (07/22/2024)   Epic    Unable to Pay for Housing in the Last Year: No    Number of Times Moved in the Last Year: 0    Homeless in the Last Year: No  Utilities: Not At Risk (07/22/2024)   Epic    Threatened with loss of utilities: No  Health Literacy: Adequate Health Literacy (07/22/2024)   B1300 Health Literacy    Frequency of need for help with medical instructions: Never   Past Surgical History:  Procedure Laterality Date   BACK SURGERY     T12 - L1, 11 times including neck   CARDIAC CATHETERIZATION N/A 12/02/2015   Procedure: Left Heart Cath and Coronary Angiography;  Surgeon: Peter M Jordan, MD;  Location: [facility] INVASIVE CV LAB;  Service: Cardiovascular;  Laterality: N/A;   CARDIAC CATHETERIZATION N/A 12/02/2015   Procedure: Coronary  Stent Intervention;  Surgeon: Peter M Jordan, MD;  Location: [facility] INVASIVE CV LAB;  Service: Cardiovascular;  Laterality: N/A;  mid LAD Promus 2.5x12   CERVICAL FUSION  2023   C4 and C5   COLONOSCOPY     CORONARY ANGIOPLASTY     one stent placed by Dr. P Jordan   CORONARY ARTERY BYPASS GRAFT N/A 05/21/2024   Procedure: OFF PUMP CORONARY ARTERY BYPASS GRAFTING (CABG) TIMES ONE UTILIZING LEFT RADIAL ARTERY;  Surgeon: Shyrl Linnie KIDD, MD;  Location: [facility] OR;  Service: Open Heart Surgery;  Laterality: N/A;   CORONARY ARTERY BYPASS GRAFT  05/21/2024   CORONARY CTO INTERVENTION N/A 03/19/2024   Procedure: CORONARY CTO INTERVENTION;  Surgeon: Jordan, Peter M, MD;  Location: [facility] INVASIVE CV LAB;  Service: Cardiovascular;  Laterality: N/A;   HARDWARE REMOVAL Right 08/26/2017   Procedure: Right Lumbar Five Revision of pedicle screw with Removal of Lumbar Five Screw;  Surgeon: Colon Shove, MD;  Location: [facility] OR;  Service: Neurosurgery;  Laterality: Right;  posterior   HARDWARE REMOVAL Right 12/27/2017   Procedure: Right Lumbar Two, Lumbar Three, Lumbar Four Pedicle screw removal with metrex;  Surgeon: Colon Shove, MD;  Location: [facility] OR;  Service: Neurosurgery;  Laterality: Right;  Right L2 to L4 Pedicle screw removal with mterex   HEMORRHOID BANDING     HERNIA REPAIR     INTRAOPERATIVE TRANSESOPHAGEAL ECHOCARDIOGRAM N/A 05/21/2024   Procedure: ECHOCARDIOGRAM, TRANSESOPHAGEAL, INTRAOPERATIVE;  Surgeon: Shyrl Linnie KIDD, MD;  Location: [facility] OR;  Service: Open Heart Surgery;  Laterality: N/A;   LAPAROSCOPY N/A 09/01/2020   Procedure: LAPAROSCOPY DIAGNOSTIC;  Surgeon: Debby Hila, MD;  Location: [facility] ORS;  Service: General;  Laterality: N/A;   LEFT HEART CATH AND CORONARY ANGIOGRAPHY N/A 02/26/2024   Procedure: LEFT HEART CATH AND CORONARY ANGIOGRAPHY;  Surgeon: Jordan, Peter M, MD;  Location: [facility] INVASIVE CV LAB;  Service: Cardiovascular;  Laterality: N/A;   MASS EXCISION  06/25/2012   Procedure:  EXCISION MASS;  Surgeon: Arley JONELLE Curia, MD;  Location: [facility];  Service: Orthopedics;  Laterality: Left;  transection of NEUROMA, BURYING RADIAL NERVE IN BRACHIORADIALIS LEFT SIDE   RADIAL ARTERY HARVEST Left 05/21/2024   Procedure: SURGICAL PROCUREMENT, ARTERY, RADIAL;  Surgeon: Shyrl Linnie KIDD, MD;  Location: [facility] OR;  Service: Open Heart Surgery;  Laterality: Left;   radial nerve Left    cut in work injury   RIGHT GREAT TOENAIL REMOVAL  04/2020   SPINAL FUSION     C6-7   TONSILLECTOMY  1982   UMBILICAL HERNIA REPAIR N/A 09/01/2020   Procedure: PRIMARY UMBILICAL HERNIA REPAIR;  Surgeon: Debby Hila, MD;  Location: [facility];  Service: General;  Laterality: N/A;   VASECTOMY     Family History  Problem Relation Age of Onset   Heart failure Mother 32       Died age 66   Congestive Heart Failure Mother    Heart attack Father 86       Died with MI   Heart attack Paternal Grandmother    Heart attack Paternal Grandfather    Colon cancer Neg Hx    Esophageal cancer Neg Hx    Pancreatic cancer Neg Hx    Stomach cancer Neg Hx    Current Medications[1]  Allergies[2]   ROS: Review of Systems Pertinent items noted in HPI and remainder of comprehensive ROS otherwise negative.    Physical exam BP 137/76   Pulse (!) 57   Temp 97.9 F (36.6 C)   Ht 5' 10 (1.778 m)   Wt 209 lb 4 oz (94.9 kg)   SpO2 98%   BMI 30.02 kg/m  General appearance: alert, cooperative, appears stated age, no distress, and mildly obese Head: Normocephalic, without obvious abnormality, atraumatic Eyes: negative findings: lids and lashes normal, conjunctivae and sclerae normal, corneas clear, and pupils equal, round, reactive to light and accomodation Ears: normal TM's and external ear canals both ears Nose: Nares normal. Septum midline. Mucosa normal. No drainage or sinus tenderness. Throat: lips, mucosa, and tongue normal; teeth and gums  normal Neck: no adenopathy, no carotid bruit, no  JVD, supple, symmetrical, trachea midline, and thyroid  not enlarged, symmetric, no tenderness/mass/nodules Back: Increased kyphosis of thoracic spine.  Ambulating with use of cane.  Gait is antalgic Lungs: clear to auscultation bilaterally Chest wall: no tenderness Heart: regular rate and rhythm, S1, S2 normal, no murmur, click, rub or gallop Abdomen: soft, non-tender; bowel sounds normal; no masses,  no organomegaly Extremities: extremities normal, atraumatic, no cyanosis or edema Pulses: 2+ and symmetric Skin: Skin color, texture, turgor normal. No rashes or lesions Lymph nodes: No supraclavicular lymphadenopathy but he does have a slightly enlarged left anterior cervical lymph node. Neurologic: Grossly normal      09/02/2024    9:32 AM 07/22/2024    8:54 AM 06/29/2024    9:13 AM  Depression screen PHQ 2/9  Decreased Interest 0 0 0  Down, Depressed, Hopeless 0 0 0  PHQ - 2 Score 0 0 0  Altered sleeping 1  1  Tired, decreased energy 0  1  Change in appetite 0  0  Feeling bad or failure about yourself  0  0  Trouble concentrating 0  1  Moving slowly or fidgety/restless 0  0  Suicidal thoughts 0  0  PHQ-9 Score 1  3  Difficult doing work/chores Not difficult at all  Not difficult at all      09/02/2024    9:32 AM 05/28/2024    2:12 PM 03/09/2024    1:25 PM 01/07/2024    2:45 PM  GAD 7 : Generalized Anxiety Score  Nervous, Anxious, on Edge 0 0  0  0   Control/stop worrying 0 0  0  0   Worry too much - different things 0 0  0  0   Trouble relaxing 2 0  2  0   Restless 0 0  1  0   Easily annoyed or irritable 1 0  0  0   Afraid - awful might happen 0 0  0  0   Total GAD 7 Score 3 0 3 0  Anxiety Difficulty Not difficult at all Not difficult at all Not difficult at all Not difficult at all     Data saved with a previous flowsheet row definition    Recent Results (from the past 2160 hours)  CBC with Differential     Status: Abnormal   Collection Time: 06/10/24 11:46 AM  Result  Value Ref Range   WBC 7.0 3.4 - 10.8 x10E3/uL   RBC 3.90 (L) 4.14 - 5.80 x10E6/uL   Hemoglobin 11.7 (L) 13.0 - 17.7 g/dL   Hematocrit 63.2 (L) 62.4 - 51.0 %   MCV 94 79 - 97 fL   MCH 30.0 26.6 - 33.0 pg   MCHC 31.9 31.5 - 35.7 g/dL   RDW 87.3 88.3 - 84.5 %   Platelets 409 150 - 450 x10E3/uL   Neutrophils 61 Not Estab. %   Lymphs 21 Not Estab. %   Monocytes 8 Not Estab. %   Eos 9 Not Estab. %   Basos 1 Not Estab. %   Neutrophils Absolute 4.3 1.4 - 7.0 x10E3/uL   Lymphocytes Absolute 1.4 0.7 - 3.1 x10E3/uL   Monocytes Absolute 0.6 0.1 - 0.9 x10E3/uL   EOS (ABSOLUTE) 0.6 (H) 0.0 - 0.4 x10E3/uL   Basophils Absolute 0.1 0.0 - 0.2 x10E3/uL   Immature Granulocytes 0 Not Estab. %   Immature Grans (Abs) 0.0 0.0 - 0.1 x10E3/uL  Basic Metabolic Panel  Status: Abnormal   Collection Time: 06/10/24 11:46 AM  Result Value Ref Range   Glucose 179 (H) 70 - 99 mg/dL   BUN 15 8 - 27 mg/dL   Creatinine, Ser 8.79 0.76 - 1.27 mg/dL   eGFR 68 >40 fO/fpw/8.26   BUN/Creatinine Ratio 13 10 - 24   Sodium 139 134 - 144 mmol/L   Potassium 4.4 3.5 - 5.2 mmol/L   Chloride 101 96 - 106 mmol/L   CO2 24 20 - 29 mmol/L   Calcium  9.2 8.6 - 10.2 mg/dL  Lipid Panel     Status: None   Collection Time: 06/10/24 11:46 AM  Result Value Ref Range   Cholesterol, Total 119 100 - 199 mg/dL   Triglycerides 89 0 - 149 mg/dL   HDL 52 >60 mg/dL   VLDL Cholesterol Cal 17 5 - 40 mg/dL   LDL Chol Calc (NIH) 50 0 - 99 mg/dL   Chol/HDL Ratio 2.3 0.0 - 5.0 ratio    Comment:                                   T. Chol/HDL Ratio                                             Men  Women                               1/2 Avg.Risk  3.4    3.3                                   Avg.Risk  5.0    4.4                                2X Avg.Risk  9.6    7.1                                3X Avg.Risk 23.4   11.0   Basic Metabolic Panel (BMET)     Status: None   Collection Time: 06/26/24 10:56 AM  Result Value Ref Range   Glucose 82  70 - 99 mg/dL   BUN 16 8 - 27 mg/dL   Creatinine, Ser 8.78 0.76 - 1.27 mg/dL   eGFR 67 >40 fO/fpw/8.26   BUN/Creatinine Ratio 13 10 - 24   Sodium 142 134 - 144 mmol/L   Potassium 4.2 3.5 - 5.2 mmol/L   Chloride 104 96 - 106 mmol/L   CO2 24 20 - 29 mmol/L   Calcium  9.4 8.6 - 10.2 mg/dL     Assessment/ Plan: Jeffrey Hooper here for annual physical exam.   Annual physical exam  Influenza A - Plan: Veritor SARS-CoV-2 and Flu A+B, oseltamivir  (TAMIFLU ) 75 MG capsule, benzonatate  (TESSALON ) 200 MG capsule  Paroxysmal atrial fibrillation (HCC) - Plan: CMP14+EGFR  Atherosclerosis of native coronary artery of native heart with angina pectoris - Plan: CMP14+EGFR  Essential hypertension - Plan: CMP14+EGFR  Mixed hyperlipidemia - Plan: CMP14+EGFR  Prediabetes - Plan: CMP14+EGFR, Bayer DCA Hb A1c Waived  Acquired hypothyroidism - Plan: CMP14+EGFR, TSH + free T4, levothyroxine  (SYNTHROID ) 100 MCG tablet  Pernicious anemia - Plan: CMP14+EGFR, CBC with Differential  Chronic prostatitis - Plan: CMP14+EGFR, PSA  GAD (generalized anxiety disorder) - Plan: PARoxetine  (PAXIL ) 20 MG tablet   Positive for influenza A.  Tamiflu  sent.  Tessalon  Perles for as needed use.  Encourage p.o. hydration and discussed signs and symptoms warranting further evaluation  Check fasting labs.  Medications have been renewed.  He seems both rate and rhythm controlled on exam today.  Lipid panel not repeated today as this was just obtained in November and was in normal range.  Check A1c given prediabetes.  CBC given known pernicious anemia.  Last B12 level was normal.  Check PSA given history of chronic prostatitis  Paxil  renewed.  Anxiety disorder stable.  Additionally, we did discuss his pain regimen today and I am glad to take that over if he feels that he can reduce down to no more than 2 tablets of Norco 5/day.  He will follow-up with me in 1 month if he decides that he would like to transition care  here  Counseled on healthy lifestyle choices, including diet (rich in fruits, vegetables and lean meats and low in salt and simple carbohydrates) and exercise (at least 30 minutes of moderate physical activity daily).  Patient to follow up 1 month  Teairra Millar M. Brooke Steinhilber, DO        [1]  Current Outpatient Medications:    acetaminophen  (TYLENOL ) 500 MG tablet, Take 1,000 mg by mouth every 8 (eight) hours as needed for moderate pain (pain score 4-6)., Disp: , Rfl:    albuterol  (VENTOLIN  HFA) 108 (90 Base) MCG/ACT inhaler, TAKE 2 PUFFS BY MOUTH EVERY 6 HOURS AS NEEDED FOR WHEEZE OR SHORTNESS OF BREATH, Disp: 18 each, Rfl: 1   amLODipine  (NORVASC ) 2.5 MG tablet, Take 1 tablet (2.5 mg total) by mouth daily., Disp: 30 tablet, Rfl: 1   aspirin  EC 325 MG tablet, Take 1 tablet (325 mg total) by mouth daily., Disp: , Rfl:    atorvastatin  (LIPITOR ) 80 MG tablet, TAKE 1 TABLET BY MOUTH EVERYDAY AT BEDTIME, Disp: 90 tablet, Rfl: 3   cetirizine  (ZYRTEC ) 10 MG tablet, Take 10 mg by mouth daily., Disp: , Rfl:    EPINEPHrine  0.3 mg/0.3 mL IJ SOAJ injection, Inject 0.3 mg into the muscle as needed for anaphylaxis., Disp: 2 each, Rfl: 0   ezetimibe  (ZETIA ) 10 MG tablet, Take 1 tablet (10 mg total) by mouth daily., Disp: 90 tablet, Rfl: 3   fluticasone  (FLONASE ) 50 MCG/ACT nasal spray, Place 2 sprays into both nostrils daily as needed for allergies or rhinitis., Disp: , Rfl:    hydrocortisone  (ANUSOL -HC) 2.5 % rectal cream, Place rectally 2 (two) times daily as needed for hemorrhoids or anal itching., Disp: 30 g, Rfl: 0   levothyroxine  (SYNTHROID ) 100 MCG tablet, Take 1 tablet (100 mcg total) by mouth daily., Disp: 90 tablet, Rfl: 3   MAGNESIUM  GLYCINATE PO, Take 2 tablets by mouth at bedtime., Disp: , Rfl:    olmesartan  (BENICAR ) 40 MG tablet, Take 1 tablet (40 mg total) by mouth daily., Disp: 90 tablet, Rfl: 3   ondansetron  (ZOFRAN -ODT) 4 MG disintegrating tablet, TAKE 1 TABLET BY MOUTH EVERY 8 HOURS AS  NEEDED FOR NAUSEA AND VOMITING, Disp: 20 tablet, Rfl: 0   pantoprazole  (PROTONIX ) 40 MG tablet, Take 40 mg by mouth 2 (two) times daily., Disp: , Rfl:    PARoxetine  (PAXIL ) 20 MG tablet, Take  1 tablet (20 mg total) by mouth daily., Disp: 90 tablet, Rfl: 3  Current Facility-Administered Medications:    cyanocobalamin  (VITAMIN B12) injection 1,000 mcg, 1,000 mcg, Intramuscular, Q30 days, Star Resler M, DO, 1,000 mcg at 08/21/24 1105 [2]  Allergies Allergen Reactions   Alpha-Gal Rash and Other (See Comments)    SEVERE ALLERGY  TO ANY MEAT OR MEAT DERIVED PRODUCTS FROM 4 LEGGED ANIMALS > > BEEF, PORK , GOATS, DEER, ETC. < < RESULT OF BITE FROM LONE STAR TICK   Bovine (Beef) Protein Rash and Other (See Comments)   Bovine (Beef) Protein-Containing Drug Products Rash and Other (See Comments)   Lambs Quarters Rash and Other (See Comments)   Porcine (Pork) Protein-Containing Drug Products Rash and Other (See Comments)   Prednisone  Other (See Comments)    im depo medrol  caused dizziness - pt passed out. Ask pt before giving. Pt can take oral prednisone     Brilinta  [Ticagrelor ] Hives    Pt ate pork on the same day he took Brilinta  before knowing he had alpha gal. May have been the alpha gal reaction, pt is unsure.    Lyrica [Pregabalin]     Dry mouth and felt raw   Methylprednisolone  Sodium Succ     im depo medrol  caused dizziness - pt passed out. Ask pt before giving. Issue occurred when administered in the left arm    Penicillins Hives    Has tolerated since   Shellfish Allergy  Hives   Adhesive [Tape] Rash   Doxycycline Hives, Swelling and Rash   Fentanyl  Rash    Reaction to adhesive, not the drug    "

## 2024-09-03 ENCOUNTER — Ambulatory Visit: Payer: Self-pay | Admitting: Family Medicine

## 2024-09-04 ENCOUNTER — Encounter (HOSPITAL_COMMUNITY)

## 2024-09-04 ENCOUNTER — Other Ambulatory Visit: Payer: Self-pay | Admitting: Family Medicine

## 2024-09-04 DIAGNOSIS — K219 Gastro-esophageal reflux disease without esophagitis: Secondary | ICD-10-CM

## 2024-09-07 ENCOUNTER — Ambulatory Visit: Admitting: Family Medicine

## 2024-09-07 ENCOUNTER — Encounter (HOSPITAL_COMMUNITY)

## 2024-09-09 ENCOUNTER — Encounter (HOSPITAL_COMMUNITY): Admission: RE | Admit: 2024-09-09 | Discharge: 2024-09-09 | Attending: Cardiology

## 2024-09-09 DIAGNOSIS — Z951 Presence of aortocoronary bypass graft: Secondary | ICD-10-CM

## 2024-09-09 NOTE — Progress Notes (Signed)
 Daily Session Note  Patient Details  Name: Jeffrey Hooper MRN: [id number] Date of Birth: 15-Aug-1960 Referring Provider:   Flowsheet Row CARDIAC REHAB PHASE II ORIENTATION from 06/29/2024 in [facility] CARDIAC REHABILITATION  Referring Provider Jordan, Peter MD    Encounter Date: 09/09/2024  Check In:  Session Check In - 09/09/24 0930       Check-In   Supervising physician immediately available to respond to emergencies See telemetry face sheet for immediately available MD    Location [facility]    Staff Present Laymon Rattler, BSN, RN, WTA-C;Heather Con, BS, Exercise Physiologist;Jessica Vonzell, MA, RCEP, CCRP, CCET    Virtual Visit No    Medication changes reported     No    Fall or balance concerns reported    No    Tobacco Cessation No Change    Warm-up and Cool-down Performed on first and last piece of equipment    Resistance Training Performed Yes    VAD Patient? No    PAD/SET Patient? No      Pain Assessment   Currently in Pain? No/denies          Capillary Blood Glucose: No results found for this or any previous visit (from the past 24 hours).    Tobacco Use History[1]  Goals Met:  Independence with exercise equipment Exercise tolerated well No report of concerns or symptoms today Strength training completed today  Goals Unmet:  Not Applicable  Comments: Pt able to follow exercise prescription today without complaint.  Will continue to monitor for progression.        [1]  Social History Tobacco Use  Smoking Status Former   Current packs/day: 0.00   Average packs/day: 1 pack/day for 20.0 years (20.0 ttl pk-yrs)   Types: Cigarettes   Start date: 11/28/1986   Quit date: 11/28/2006   Years since quitting: 17.7  Smokeless Tobacco Never

## 2024-09-11 ENCOUNTER — Encounter (HOSPITAL_COMMUNITY)

## 2024-09-11 VITALS — Ht 69.0 in | Wt 211.2 lb

## 2024-09-11 DIAGNOSIS — Z951 Presence of aortocoronary bypass graft: Secondary | ICD-10-CM

## 2024-09-11 NOTE — Patient Instructions (Addendum)
 Discharge Patient Instructions  Patient Details  Name: Jeffrey Hooper MRN: [id number] Date of Birth: 1961/06/23 Referring Provider:  [name], DO   Number of Visits: 36  Reason for Discharge:  Patient reached a stable level of exercise. Patient independent in their exercise. Patient has met program and personal goals.  Smoking History:  Social History   Tobacco Use  Smoking Status Former   Current packs/day: 0.00   Average packs/day: 1 pack/day for 20.0 years (20.0 ttl pk-yrs)   Types: Cigarettes   Start date: 11/28/1986   Quit date: 11/28/2006   Years since quitting: 17.8  Smokeless Tobacco Never    Diagnosis:  S/P CABG x 1  Initial Exercise Prescription:  Initial Exercise Prescription - 06/29/24 0900       Date of Initial Exercise RX and Referring Provider   Date 06/29/24    Referring Provider Jordan, Peter MD      NuStep   Level 2    SPM 50    Minutes 15    METs 1.9      Arm Ergometer   Level 1    RPM 50    Minutes 15    METs 1.9      Prescription Details   Frequency (times per week) 3    Duration Progress to 30 minutes of continuous aerobic without signs/symptoms of physical distress      Intensity   THRR 40-80% of Max Heartrate 96-137    Ratings of Perceived Exertion 11-13    Perceived Dyspnea 0-4      Resistance Training   Training Prescription Yes    Weight 4    Reps 10-15          Discharge Exercise Prescription (Final Exercise Prescription Changes):  Exercise Prescription Changes - 08/12/24 1300       Response to Exercise   Blood Pressure (Admit) 104/70    Blood Pressure (Exit) 110/50    Heart Rate (Admit) 63 bpm    Heart Rate (Exercise) 101 bpm    Heart Rate (Exit) 69 bpm    Rating of Perceived Exertion (Exercise) 13    Duration Continue with 30 min of aerobic exercise without signs/symptoms of physical distress.    Intensity THRR unchanged      Progression   Progression Continue to progress workloads to maintain  intensity without signs/symptoms of physical distress.      Resistance Training   Weight 4    Reps 10-15      Treadmill   MPH 2.2    Grade 1.5    Minutes 15    METs 3.14      NuStep   Level 7    SPM 111    Minutes 15    METs 2.8          Functional Capacity:  6 Minute Walk     Row Name 06/29/24 0932 09/11/24 1119       6 Minute Walk   Phase Initial Discharge    Distance 1000 feet 1250 feet    Distance % Change -- 25 %    Distance Feet Change -- 250 ft    Walk Time 6 minutes 6 minutes    # of Rest Breaks 0 0    MPH 1.9 2.37    METS 2.4 2.94    RPE 13 13    Perceived Dyspnea  1 0    VO2 Peak 8.4 10.32    Symptoms No No    Resting  HR 56 bpm 53 bpm    Resting BP 130/66 122/62    Resting Oxygen Saturation  97 % 97 %    Exercise Oxygen Saturation  during 6 min walk 97 % 97 %    Max Ex. HR 76 bpm 91 bpm    Max Ex. BP 140/60 136/60    2 Minute Post BP 132/60 122/60      Nutrition & Weight - Outcomes:  Pre Biometrics - 06/29/24 0935       Pre Biometrics   Height 5' 9 (1.753 m)    Weight 94.8 kg    Waist Circumference 46 inches    Hip Circumference 42 inches    Waist to Hip Ratio 1.1 %    BMI (Calculated) 30.85    Grip Strength 31.8 kg    Single Leg Stand 0 seconds          Post Biometrics - 09/11/24 1121        Post  Biometrics   Height 5' 9 (1.753 m)    Weight 95.8 kg    Waist Circumference 46 inches    Hip Circumference 42 inches    Waist to Hip Ratio 1.1 %    BMI (Calculated) 31.17    Grip Strength 32.3 kg         Goals reviewed with patient; copy given to patient.

## 2024-09-11 NOTE — Progress Notes (Signed)
 Daily Session Note  Patient Details  Name: Jeffrey Hooper MRN: [id number] Date of Birth: Dec 19, 1960 Referring Provider:   Flowsheet Row CARDIAC REHAB PHASE II ORIENTATION from 06/29/2024 in [facility]  Referring Provider Jordan, Peter MD    Encounter Date: 09/11/2024  Check In:  Session Check In - 09/11/24 1040       Check-In   Supervising physician immediately available to respond to emergencies See telemetry face sheet for immediately available MD    Location [facility]    Staff Present Laymon Rattler, BSN, RN, WTA-C;Heather Con, BS, Exercise Physiologist;Victoria Zina, RN    Virtual Visit No    Medication changes reported     No    Fall or balance concerns reported    No    Tobacco Cessation No Change    Warm-up and Cool-down Performed on first and last piece of equipment    Resistance Training Performed Yes    VAD Patient? No    PAD/SET Patient? No      Pain Assessment   Currently in Pain? No/denies          Capillary Blood Glucose: No results found for this or any previous visit (from the past 24 hours).    Tobacco Use History[1]  Goals Met:  Independence with exercise equipment Exercise tolerated well No report of concerns or symptoms today Strength training completed today  Goals Unmet:  Not Applicable  Comments: Pt able to follow exercise prescription today without complaint.  Will continue to monitor for progression.        [1]  Social History Tobacco Use  Smoking Status Former   Current packs/day: 0.00   Average packs/day: 1 pack/day for 20.0 years (20.0 ttl pk-yrs)   Types: Cigarettes   Start date: 11/28/1986   Quit date: 11/28/2006   Years since quitting: 17.8  Smokeless Tobacco Never

## 2024-09-14 ENCOUNTER — Encounter (HOSPITAL_COMMUNITY)

## 2024-09-14 ENCOUNTER — Ambulatory Visit: Admitting: Cardiology

## 2024-09-16 ENCOUNTER — Encounter (HOSPITAL_COMMUNITY)

## 2024-09-18 ENCOUNTER — Encounter (HOSPITAL_COMMUNITY)

## 2024-09-21 ENCOUNTER — Ambulatory Visit

## 2024-09-24 ENCOUNTER — Ambulatory Visit: Admitting: Family Medicine

## 2024-10-07 ENCOUNTER — Ambulatory Visit: Admitting: Cardiology

## 2024-10-12 ENCOUNTER — Ambulatory Visit: Admitting: Family Medicine

## 2025-07-23 ENCOUNTER — Ambulatory Visit

## 2025-09-03 ENCOUNTER — Encounter: Admitting: Family Medicine
